# Patient Record
Sex: Female | Born: 1945
Health system: Southern US, Community
[De-identification: ages and names within clinical notes are randomized; demographics above are authoritative.]

## PROBLEM LIST (undated history)

## (undated) DIAGNOSIS — D509 Iron deficiency anemia, unspecified: Secondary | ICD-10-CM

## (undated) DIAGNOSIS — Z856 Personal history of leukemia: Secondary | ICD-10-CM

## (undated) DIAGNOSIS — Z4689 Encounter for fitting and adjustment of other specified devices: Secondary | ICD-10-CM

## (undated) DIAGNOSIS — C801 Malignant (primary) neoplasm, unspecified: Secondary | ICD-10-CM

## (undated) DIAGNOSIS — D759 Disease of blood and blood-forming organs, unspecified: Secondary | ICD-10-CM

## (undated) DIAGNOSIS — K219 Gastro-esophageal reflux disease without esophagitis: Secondary | ICD-10-CM

## (undated) DIAGNOSIS — E669 Obesity, unspecified: Secondary | ICD-10-CM

## (undated) DIAGNOSIS — E785 Hyperlipidemia, unspecified: Secondary | ICD-10-CM

## (undated) DIAGNOSIS — I10 Essential (primary) hypertension: Secondary | ICD-10-CM

## (undated) DIAGNOSIS — I639 Cerebral infarction, unspecified: Secondary | ICD-10-CM

## (undated) DIAGNOSIS — M1990 Unspecified osteoarthritis, unspecified site: Secondary | ICD-10-CM

## (undated) HISTORY — DX: Obesity, unspecified: E66.9

## (undated) HISTORY — DX: Iron deficiency anemia, unspecified: D50.9

## (undated) HISTORY — DX: Cerebral infarction, unspecified: I63.9

## (undated) HISTORY — PX: ABDOMINAL HYSTERECTOMY: SHX81

## (undated) HISTORY — DX: Gastro-esophageal reflux disease without esophagitis: K21.9

## (undated) HISTORY — PX: HERNIA REPAIR: SHX51

## (undated) HISTORY — DX: Essential (primary) hypertension: I10

## (undated) HISTORY — PX: CHOLECYSTECTOMY: SHX55

## (undated) HISTORY — DX: Hyperlipidemia, unspecified: E78.5

## (undated) HISTORY — DX: Personal history of leukemia: Z85.6

## (undated) HISTORY — DX: Encounter for fitting and adjustment of other specified devices: Z46.89

## (undated) HISTORY — PX: OOPHORECTOMY: SHX86

---

## 2001-02-24 ENCOUNTER — Ambulatory Visit (HOSPITAL_COMMUNITY): Admission: RE | Admit: 2001-02-24 | Discharge: 2001-02-24 | Payer: Self-pay | Admitting: Family Medicine

## 2001-02-24 ENCOUNTER — Encounter: Payer: Self-pay | Admitting: Family Medicine

## 2002-02-24 ENCOUNTER — Ambulatory Visit (HOSPITAL_COMMUNITY): Admission: RE | Admit: 2002-02-24 | Discharge: 2002-02-24 | Payer: Self-pay | Admitting: Family Medicine

## 2002-02-24 ENCOUNTER — Encounter: Payer: Self-pay | Admitting: Family Medicine

## 2002-06-09 ENCOUNTER — Ambulatory Visit (HOSPITAL_COMMUNITY): Admission: RE | Admit: 2002-06-09 | Discharge: 2002-06-09 | Payer: Self-pay | Admitting: Internal Medicine

## 2002-06-09 HISTORY — PX: COLONOSCOPY: SHX174

## 2002-06-09 HISTORY — PX: ESOPHAGOGASTRODUODENOSCOPY: SHX1529

## 2003-03-07 ENCOUNTER — Ambulatory Visit (HOSPITAL_COMMUNITY): Admission: RE | Admit: 2003-03-07 | Discharge: 2003-03-07 | Payer: Self-pay | Admitting: Family Medicine

## 2003-04-29 ENCOUNTER — Encounter: Admission: RE | Admit: 2003-04-29 | Discharge: 2003-04-29 | Payer: Self-pay | Admitting: Oncology

## 2003-06-07 ENCOUNTER — Encounter: Admission: RE | Admit: 2003-06-07 | Discharge: 2003-06-07 | Payer: Self-pay | Admitting: Oncology

## 2003-06-07 ENCOUNTER — Encounter (HOSPITAL_COMMUNITY): Admission: RE | Admit: 2003-06-07 | Discharge: 2003-07-07 | Payer: Self-pay | Admitting: Oncology

## 2003-07-13 ENCOUNTER — Encounter: Admission: RE | Admit: 2003-07-13 | Discharge: 2003-07-13 | Payer: Self-pay | Admitting: Oncology

## 2003-07-13 ENCOUNTER — Encounter (HOSPITAL_COMMUNITY): Admission: RE | Admit: 2003-07-13 | Discharge: 2003-08-12 | Payer: Self-pay | Admitting: Oncology

## 2003-08-24 ENCOUNTER — Encounter (HOSPITAL_COMMUNITY): Admission: RE | Admit: 2003-08-24 | Discharge: 2003-09-23 | Payer: Self-pay | Admitting: Oncology

## 2003-08-24 ENCOUNTER — Encounter: Admission: RE | Admit: 2003-08-24 | Discharge: 2003-08-24 | Payer: Self-pay | Admitting: Oncology

## 2003-10-05 ENCOUNTER — Encounter (HOSPITAL_COMMUNITY): Admission: RE | Admit: 2003-10-05 | Discharge: 2003-11-04 | Payer: Self-pay | Admitting: Oncology

## 2003-10-05 ENCOUNTER — Encounter: Admission: RE | Admit: 2003-10-05 | Discharge: 2003-10-05 | Payer: Self-pay | Admitting: Oncology

## 2003-12-21 ENCOUNTER — Encounter (HOSPITAL_COMMUNITY): Admission: RE | Admit: 2003-12-21 | Discharge: 2004-01-20 | Payer: Self-pay | Admitting: Oncology

## 2003-12-21 ENCOUNTER — Encounter: Admission: RE | Admit: 2003-12-21 | Discharge: 2003-12-21 | Payer: Self-pay | Admitting: Oncology

## 2004-02-06 ENCOUNTER — Encounter (HOSPITAL_COMMUNITY): Admission: RE | Admit: 2004-02-06 | Discharge: 2004-03-07 | Payer: Self-pay | Admitting: Oncology

## 2004-02-06 ENCOUNTER — Ambulatory Visit (HOSPITAL_COMMUNITY): Payer: Self-pay | Admitting: Oncology

## 2004-02-06 ENCOUNTER — Encounter: Admission: RE | Admit: 2004-02-06 | Discharge: 2004-02-06 | Payer: Self-pay | Admitting: Oncology

## 2004-03-08 ENCOUNTER — Ambulatory Visit (HOSPITAL_COMMUNITY): Admission: RE | Admit: 2004-03-08 | Discharge: 2004-03-08 | Payer: Self-pay | Admitting: Family Medicine

## 2004-03-14 ENCOUNTER — Encounter: Admission: RE | Admit: 2004-03-14 | Discharge: 2004-03-14 | Payer: Self-pay | Admitting: Oncology

## 2004-03-14 ENCOUNTER — Encounter (HOSPITAL_COMMUNITY): Admission: RE | Admit: 2004-03-14 | Discharge: 2004-04-13 | Payer: Self-pay | Admitting: Oncology

## 2004-03-28 ENCOUNTER — Ambulatory Visit (HOSPITAL_COMMUNITY): Payer: Self-pay | Admitting: Oncology

## 2004-04-27 ENCOUNTER — Encounter (HOSPITAL_COMMUNITY): Admission: RE | Admit: 2004-04-27 | Discharge: 2004-05-27 | Payer: Self-pay | Admitting: Oncology

## 2004-04-27 ENCOUNTER — Encounter: Admission: RE | Admit: 2004-04-27 | Discharge: 2004-04-27 | Payer: Self-pay | Admitting: Oncology

## 2004-05-25 ENCOUNTER — Ambulatory Visit (HOSPITAL_COMMUNITY): Payer: Self-pay | Admitting: Oncology

## 2004-06-21 ENCOUNTER — Encounter (HOSPITAL_COMMUNITY): Admission: RE | Admit: 2004-06-21 | Discharge: 2004-07-21 | Payer: Self-pay | Admitting: Oncology

## 2004-06-21 ENCOUNTER — Encounter: Admission: RE | Admit: 2004-06-21 | Discharge: 2004-06-21 | Payer: Self-pay | Admitting: Oncology

## 2004-07-20 ENCOUNTER — Ambulatory Visit (HOSPITAL_COMMUNITY): Payer: Self-pay | Admitting: Oncology

## 2004-08-06 ENCOUNTER — Encounter: Admission: RE | Admit: 2004-08-06 | Discharge: 2004-08-06 | Payer: Self-pay | Admitting: Oncology

## 2004-08-06 ENCOUNTER — Encounter (HOSPITAL_COMMUNITY): Admission: RE | Admit: 2004-08-06 | Discharge: 2004-09-05 | Payer: Self-pay | Admitting: Oncology

## 2004-09-28 ENCOUNTER — Encounter: Admission: RE | Admit: 2004-09-28 | Discharge: 2004-09-28 | Payer: Self-pay | Admitting: Oncology

## 2004-09-28 ENCOUNTER — Ambulatory Visit (HOSPITAL_COMMUNITY): Payer: Self-pay | Admitting: Oncology

## 2004-11-09 ENCOUNTER — Encounter (HOSPITAL_COMMUNITY): Admission: RE | Admit: 2004-11-09 | Discharge: 2004-12-08 | Payer: Self-pay | Admitting: Oncology

## 2004-11-09 ENCOUNTER — Encounter: Admission: RE | Admit: 2004-11-09 | Discharge: 2004-12-08 | Payer: Self-pay | Admitting: Oncology

## 2004-12-21 ENCOUNTER — Encounter (HOSPITAL_COMMUNITY): Admission: RE | Admit: 2004-12-21 | Discharge: 2005-01-20 | Payer: Self-pay | Admitting: Oncology

## 2004-12-21 ENCOUNTER — Encounter: Admission: RE | Admit: 2004-12-21 | Discharge: 2004-12-21 | Payer: Self-pay | Admitting: Oncology

## 2004-12-21 ENCOUNTER — Ambulatory Visit (HOSPITAL_COMMUNITY): Payer: Self-pay | Admitting: Oncology

## 2005-01-30 ENCOUNTER — Encounter (HOSPITAL_COMMUNITY): Admission: RE | Admit: 2005-01-30 | Discharge: 2005-03-01 | Payer: Self-pay | Admitting: Oncology

## 2005-01-30 ENCOUNTER — Encounter: Admission: RE | Admit: 2005-01-30 | Discharge: 2005-01-30 | Payer: Self-pay | Admitting: Oncology

## 2005-03-08 ENCOUNTER — Encounter (HOSPITAL_COMMUNITY): Admission: RE | Admit: 2005-03-08 | Discharge: 2005-04-07 | Payer: Self-pay | Admitting: Oncology

## 2005-03-08 ENCOUNTER — Encounter: Admission: RE | Admit: 2005-03-08 | Discharge: 2005-03-08 | Payer: Self-pay | Admitting: Oncology

## 2005-03-08 ENCOUNTER — Ambulatory Visit (HOSPITAL_COMMUNITY): Payer: Self-pay | Admitting: Oncology

## 2005-04-04 ENCOUNTER — Encounter (HOSPITAL_COMMUNITY): Admission: RE | Admit: 2005-04-04 | Discharge: 2005-05-04 | Payer: Self-pay | Admitting: Oncology

## 2005-04-04 ENCOUNTER — Ambulatory Visit (HOSPITAL_COMMUNITY): Admission: RE | Admit: 2005-04-04 | Discharge: 2005-04-04 | Payer: Self-pay | Admitting: Family Medicine

## 2005-04-04 ENCOUNTER — Encounter: Admission: RE | Admit: 2005-04-04 | Discharge: 2005-04-04 | Payer: Self-pay | Admitting: Oncology

## 2005-05-03 ENCOUNTER — Ambulatory Visit (HOSPITAL_COMMUNITY): Payer: Self-pay | Admitting: Oncology

## 2005-06-17 ENCOUNTER — Encounter: Admission: RE | Admit: 2005-06-17 | Discharge: 2005-06-17 | Payer: Self-pay | Admitting: Oncology

## 2005-06-17 ENCOUNTER — Encounter (HOSPITAL_COMMUNITY): Admission: RE | Admit: 2005-06-17 | Discharge: 2005-07-17 | Payer: Self-pay | Admitting: Oncology

## 2005-07-19 ENCOUNTER — Ambulatory Visit (HOSPITAL_COMMUNITY): Admission: RE | Admit: 2005-07-19 | Discharge: 2005-07-19 | Payer: Self-pay | Admitting: Family Medicine

## 2005-07-29 ENCOUNTER — Encounter: Admission: RE | Admit: 2005-07-29 | Discharge: 2005-07-29 | Payer: Self-pay | Admitting: Oncology

## 2005-07-29 ENCOUNTER — Ambulatory Visit (HOSPITAL_COMMUNITY): Payer: Self-pay | Admitting: Oncology

## 2005-07-29 ENCOUNTER — Encounter (HOSPITAL_COMMUNITY): Admission: RE | Admit: 2005-07-29 | Discharge: 2005-08-28 | Payer: Self-pay | Admitting: Oncology

## 2005-09-09 ENCOUNTER — Encounter: Admission: RE | Admit: 2005-09-09 | Discharge: 2005-09-09 | Payer: Self-pay | Admitting: Oncology

## 2005-09-09 ENCOUNTER — Encounter (HOSPITAL_COMMUNITY): Admission: RE | Admit: 2005-09-09 | Discharge: 2005-10-09 | Payer: Self-pay | Admitting: Oncology

## 2005-10-25 ENCOUNTER — Encounter: Admission: RE | Admit: 2005-10-25 | Discharge: 2005-10-25 | Payer: Self-pay | Admitting: Oncology

## 2005-10-25 ENCOUNTER — Encounter (HOSPITAL_COMMUNITY): Admission: RE | Admit: 2005-10-25 | Discharge: 2005-11-24 | Payer: Self-pay | Admitting: Oncology

## 2005-12-06 ENCOUNTER — Encounter (HOSPITAL_COMMUNITY): Admission: RE | Admit: 2005-12-06 | Discharge: 2006-01-05 | Payer: Self-pay | Admitting: Oncology

## 2005-12-06 ENCOUNTER — Encounter: Admission: RE | Admit: 2005-12-06 | Discharge: 2005-12-06 | Payer: Self-pay | Admitting: Oncology

## 2006-01-17 ENCOUNTER — Encounter (HOSPITAL_COMMUNITY): Admission: RE | Admit: 2006-01-17 | Discharge: 2006-02-16 | Payer: Self-pay | Admitting: Oncology

## 2006-01-17 ENCOUNTER — Encounter: Admission: RE | Admit: 2006-01-17 | Discharge: 2006-01-17 | Payer: Self-pay | Admitting: Oncology

## 2006-01-17 ENCOUNTER — Ambulatory Visit (HOSPITAL_COMMUNITY): Payer: Self-pay | Admitting: Oncology

## 2006-02-28 ENCOUNTER — Encounter (HOSPITAL_COMMUNITY): Admission: RE | Admit: 2006-02-28 | Discharge: 2006-03-10 | Payer: Self-pay | Admitting: Oncology

## 2006-04-07 ENCOUNTER — Ambulatory Visit (HOSPITAL_COMMUNITY): Admission: RE | Admit: 2006-04-07 | Discharge: 2006-04-07 | Payer: Self-pay | Admitting: Family Medicine

## 2006-04-14 ENCOUNTER — Ambulatory Visit (HOSPITAL_COMMUNITY): Payer: Self-pay | Admitting: Oncology

## 2006-04-14 ENCOUNTER — Encounter (HOSPITAL_COMMUNITY): Admission: RE | Admit: 2006-04-14 | Discharge: 2006-05-14 | Payer: Self-pay | Admitting: Oncology

## 2006-05-29 ENCOUNTER — Encounter (HOSPITAL_COMMUNITY): Admission: RE | Admit: 2006-05-29 | Discharge: 2006-06-28 | Payer: Self-pay | Admitting: Oncology

## 2006-07-11 ENCOUNTER — Encounter (HOSPITAL_COMMUNITY): Admission: RE | Admit: 2006-07-11 | Discharge: 2006-08-10 | Payer: Self-pay | Admitting: Oncology

## 2006-07-11 ENCOUNTER — Ambulatory Visit (HOSPITAL_COMMUNITY): Payer: Self-pay | Admitting: Oncology

## 2006-08-25 ENCOUNTER — Encounter (HOSPITAL_COMMUNITY): Admission: RE | Admit: 2006-08-25 | Discharge: 2006-09-24 | Payer: Self-pay | Admitting: Oncology

## 2006-10-02 ENCOUNTER — Encounter (HOSPITAL_COMMUNITY): Admission: RE | Admit: 2006-10-02 | Discharge: 2006-11-01 | Payer: Self-pay | Admitting: Oncology

## 2006-10-02 ENCOUNTER — Ambulatory Visit (HOSPITAL_COMMUNITY): Payer: Self-pay | Admitting: Oncology

## 2006-11-04 ENCOUNTER — Encounter (HOSPITAL_COMMUNITY): Admission: RE | Admit: 2006-11-04 | Discharge: 2006-12-04 | Payer: Self-pay | Admitting: Oncology

## 2007-01-15 ENCOUNTER — Other Ambulatory Visit: Admission: RE | Admit: 2007-01-15 | Discharge: 2007-01-15 | Payer: Self-pay | Admitting: Obstetrics and Gynecology

## 2007-01-20 ENCOUNTER — Ambulatory Visit (HOSPITAL_COMMUNITY): Admission: RE | Admit: 2007-01-20 | Discharge: 2007-01-20 | Payer: Self-pay | Admitting: Obstetrics & Gynecology

## 2007-01-30 ENCOUNTER — Ambulatory Visit (HOSPITAL_COMMUNITY): Payer: Self-pay | Admitting: Oncology

## 2007-01-30 ENCOUNTER — Encounter (HOSPITAL_COMMUNITY): Admission: RE | Admit: 2007-01-30 | Discharge: 2007-03-01 | Payer: Self-pay | Admitting: Oncology

## 2007-03-12 HISTORY — PX: OTHER SURGICAL HISTORY: SHX169

## 2007-04-20 ENCOUNTER — Ambulatory Visit (HOSPITAL_COMMUNITY): Admission: RE | Admit: 2007-04-20 | Discharge: 2007-04-20 | Payer: Self-pay | Admitting: Family Medicine

## 2007-05-06 ENCOUNTER — Encounter (HOSPITAL_COMMUNITY): Admission: RE | Admit: 2007-05-06 | Discharge: 2007-06-05 | Payer: Self-pay | Admitting: Oncology

## 2007-05-06 ENCOUNTER — Ambulatory Visit (HOSPITAL_COMMUNITY): Payer: Self-pay | Admitting: Oncology

## 2007-05-22 ENCOUNTER — Ambulatory Visit (HOSPITAL_COMMUNITY): Admission: RE | Admit: 2007-05-22 | Discharge: 2007-05-22 | Payer: Self-pay | Admitting: Family Medicine

## 2007-06-17 ENCOUNTER — Encounter (HOSPITAL_COMMUNITY): Admission: RE | Admit: 2007-06-17 | Discharge: 2007-07-17 | Payer: Self-pay | Admitting: Oncology

## 2007-09-04 ENCOUNTER — Ambulatory Visit (HOSPITAL_COMMUNITY): Payer: Self-pay | Admitting: Oncology

## 2007-09-10 ENCOUNTER — Encounter: Payer: Self-pay | Admitting: Obstetrics and Gynecology

## 2007-09-10 ENCOUNTER — Ambulatory Visit (HOSPITAL_COMMUNITY): Admission: RE | Admit: 2007-09-10 | Discharge: 2007-09-11 | Payer: Self-pay | Admitting: Obstetrics and Gynecology

## 2007-10-14 ENCOUNTER — Encounter (HOSPITAL_COMMUNITY): Admission: RE | Admit: 2007-10-14 | Discharge: 2007-11-13 | Payer: Self-pay | Admitting: Oncology

## 2007-11-27 ENCOUNTER — Encounter (HOSPITAL_COMMUNITY): Admission: RE | Admit: 2007-11-27 | Discharge: 2007-12-27 | Payer: Self-pay | Admitting: Oncology

## 2007-11-27 ENCOUNTER — Ambulatory Visit (HOSPITAL_COMMUNITY): Payer: Self-pay | Admitting: Oncology

## 2008-01-08 ENCOUNTER — Encounter (HOSPITAL_COMMUNITY): Admission: RE | Admit: 2008-01-08 | Discharge: 2008-02-07 | Payer: Self-pay | Admitting: Oncology

## 2008-02-26 ENCOUNTER — Ambulatory Visit (HOSPITAL_COMMUNITY): Payer: Self-pay | Admitting: Oncology

## 2008-02-26 ENCOUNTER — Encounter (HOSPITAL_COMMUNITY): Admission: RE | Admit: 2008-02-26 | Discharge: 2008-03-09 | Payer: Self-pay | Admitting: Oncology

## 2008-04-08 ENCOUNTER — Ambulatory Visit (HOSPITAL_COMMUNITY): Admission: RE | Admit: 2008-04-08 | Discharge: 2008-04-08 | Payer: Self-pay | Admitting: Family Medicine

## 2008-04-12 ENCOUNTER — Encounter (HOSPITAL_COMMUNITY): Admission: RE | Admit: 2008-04-12 | Discharge: 2008-05-12 | Payer: Self-pay | Admitting: Oncology

## 2008-04-12 ENCOUNTER — Ambulatory Visit (HOSPITAL_COMMUNITY): Payer: Self-pay | Admitting: Oncology

## 2008-04-22 ENCOUNTER — Ambulatory Visit (HOSPITAL_COMMUNITY): Admission: RE | Admit: 2008-04-22 | Discharge: 2008-04-22 | Payer: Self-pay | Admitting: Family Medicine

## 2008-06-09 ENCOUNTER — Encounter (HOSPITAL_COMMUNITY): Admission: RE | Admit: 2008-06-09 | Discharge: 2008-07-09 | Payer: Self-pay | Admitting: Oncology

## 2008-06-09 ENCOUNTER — Ambulatory Visit (HOSPITAL_COMMUNITY): Payer: Self-pay | Admitting: Oncology

## 2008-08-11 ENCOUNTER — Encounter (HOSPITAL_COMMUNITY): Admission: RE | Admit: 2008-08-11 | Discharge: 2008-09-10 | Payer: Self-pay | Admitting: Oncology

## 2008-08-11 ENCOUNTER — Ambulatory Visit (HOSPITAL_COMMUNITY): Payer: Self-pay | Admitting: Oncology

## 2008-10-05 ENCOUNTER — Encounter: Admission: RE | Admit: 2008-10-05 | Discharge: 2008-10-05 | Payer: Self-pay | Admitting: Endocrinology

## 2008-10-05 ENCOUNTER — Other Ambulatory Visit: Admission: RE | Admit: 2008-10-05 | Discharge: 2008-10-05 | Payer: Self-pay | Admitting: Interventional Radiology

## 2008-10-05 ENCOUNTER — Encounter (INDEPENDENT_AMBULATORY_CARE_PROVIDER_SITE_OTHER): Payer: Self-pay | Admitting: Interventional Radiology

## 2008-11-09 ENCOUNTER — Ambulatory Visit (HOSPITAL_COMMUNITY): Payer: Self-pay | Admitting: Oncology

## 2008-11-09 ENCOUNTER — Encounter (HOSPITAL_COMMUNITY): Admission: RE | Admit: 2008-11-09 | Discharge: 2008-12-07 | Payer: Self-pay | Admitting: Oncology

## 2009-01-04 ENCOUNTER — Encounter (HOSPITAL_COMMUNITY): Admission: RE | Admit: 2009-01-04 | Discharge: 2009-02-03 | Payer: Self-pay | Admitting: Oncology

## 2009-01-05 ENCOUNTER — Ambulatory Visit (HOSPITAL_COMMUNITY): Payer: Self-pay | Admitting: Oncology

## 2009-03-07 ENCOUNTER — Encounter (HOSPITAL_COMMUNITY): Admission: RE | Admit: 2009-03-07 | Discharge: 2009-03-10 | Payer: Self-pay | Admitting: Oncology

## 2009-03-07 ENCOUNTER — Ambulatory Visit (HOSPITAL_COMMUNITY): Payer: Self-pay | Admitting: Oncology

## 2009-05-01 ENCOUNTER — Ambulatory Visit (HOSPITAL_COMMUNITY): Admission: RE | Admit: 2009-05-01 | Discharge: 2009-05-01 | Payer: Self-pay | Admitting: Family Medicine

## 2010-04-05 ENCOUNTER — Other Ambulatory Visit (HOSPITAL_COMMUNITY): Payer: Self-pay | Admitting: Internal Medicine

## 2010-04-05 DIAGNOSIS — Z139 Encounter for screening, unspecified: Secondary | ICD-10-CM

## 2010-05-03 ENCOUNTER — Ambulatory Visit (HOSPITAL_COMMUNITY): Payer: BC Managed Care – PPO

## 2010-05-07 ENCOUNTER — Ambulatory Visit (HOSPITAL_COMMUNITY)
Admission: RE | Admit: 2010-05-07 | Discharge: 2010-05-07 | Disposition: A | Discharge status: Home / Self Care | Payer: BC Managed Care – PPO | Source: Ambulatory Visit | Attending: Internal Medicine | Admitting: Internal Medicine

## 2010-05-07 DIAGNOSIS — Z1231 Encounter for screening mammogram for malignant neoplasm of breast: Secondary | ICD-10-CM | POA: Insufficient documentation

## 2010-05-07 DIAGNOSIS — Z139 Encounter for screening, unspecified: Secondary | ICD-10-CM

## 2010-06-11 LAB — COMPREHENSIVE METABOLIC PANEL
GFR calc Af Amer: >60 mL/min (ref >60)
GFR calc non Af Amer: >60 mL/min (ref >60)
Glucose, Bld: 222 mg/dL — ABNORMAL HIGH (ref 70–99)
Sodium: 138 mEq/L (ref 135–145)
Total Bilirubin: 0.6 mg/dL (ref 0.3–1.2)
Total Protein: 7.3 g/dL (ref 6.0–8.3)

## 2010-06-11 LAB — DIFFERENTIAL
Basophils Relative: 1 % (ref 0–1)
Lymphs Abs: 2.4 10*3/uL (ref 0.7–4.0)
Neutro Abs: 4.4 10*3/uL (ref 1.7–7.7)
Neutrophils Relative %: 60 % (ref 43–77)

## 2010-06-11 LAB — CBC
Hemoglobin: 13.5 g/dL (ref 12.0–15.0)
MCV: 84.8 fL (ref 78.0–100.0)
Platelets: 188 10*3/uL (ref 150–400)
WBC: 7.4 10*3/uL (ref 4.0–10.5)

## 2010-06-14 LAB — CBC
MCHC: 33.8 g/dL (ref 30.0–36.0)
Platelets: 213 10*3/uL (ref 150–400)
RBC: 4.71 MIL/uL (ref 3.87–5.11)
WBC: 9.9 10*3/uL (ref 4.0–10.5)

## 2010-06-14 LAB — DIFFERENTIAL
Basophils Relative: 1 % (ref 0–1)
Eosinophils Absolute: 0.1 10*3/uL (ref 0.0–0.7)
Eosinophils Relative: 1 % (ref 0–5)
Lymphocytes Relative: 31 % (ref 12–46)
Lymphs Abs: 3.1 10*3/uL (ref 0.7–4.0)
Monocytes Absolute: 0.4 10*3/uL (ref 0.1–1.0)
Monocytes Relative: 4 % (ref 3–12)
Neutro Abs: 6.3 10*3/uL (ref 1.7–7.7)
Neutrophils Relative %: 63 % (ref 43–77)

## 2010-06-15 LAB — COMPREHENSIVE METABOLIC PANEL
AST: 27 U/L (ref 0–37)
BUN: 11 mg/dL (ref 6–23)
CO2: 28 mEq/L (ref 19–32)
Chloride: 106 mEq/L (ref 96–112)
Potassium: 3.3 mEq/L — ABNORMAL LOW (ref 3.5–5.1)
Sodium: 142 mEq/L (ref 135–145)
Total Bilirubin: 0.5 mg/dL (ref 0.3–1.2)
Total Protein: 7.2 g/dL (ref 6.0–8.3)

## 2010-06-15 LAB — CBC
MCV: 84.7 fL (ref 78.0–100.0)
RDW: 15.8 % — ABNORMAL HIGH (ref 11.5–15.5)

## 2010-06-15 LAB — DIFFERENTIAL
Basophils Absolute: 0 10*3/uL (ref 0.0–0.1)
Basophils Relative: 1 % (ref 0–1)
Eosinophils Absolute: 0.1 10*3/uL (ref 0.0–0.7)
Lymphs Abs: 2.9 10*3/uL (ref 0.7–4.0)

## 2010-06-16 ENCOUNTER — Emergency Department (HOSPITAL_COMMUNITY)
Admission: EM | Admit: 2010-06-16 | Discharge: 2010-06-16 | Disposition: A | Discharge status: Home / Self Care | Payer: Medicare HMO | Attending: Emergency Medicine | Admitting: Emergency Medicine

## 2010-06-16 ENCOUNTER — Emergency Department (HOSPITAL_COMMUNITY): Payer: Medicare HMO

## 2010-06-16 DIAGNOSIS — T07XXXA Unspecified multiple injuries, initial encounter: Secondary | ICD-10-CM | POA: Insufficient documentation

## 2010-06-16 DIAGNOSIS — Y998 Other external cause status: Secondary | ICD-10-CM | POA: Insufficient documentation

## 2010-06-16 DIAGNOSIS — W010XXA Fall on same level from slipping, tripping and stumbling without subsequent striking against object, initial encounter: Secondary | ICD-10-CM | POA: Insufficient documentation

## 2010-06-17 LAB — CBC
Hemoglobin: 13.9 g/dL (ref 12.0–15.0)
MCV: 84 fL (ref 78.0–100.0)
Platelets: 195 10*3/uL (ref 150–400)
RDW: 14.8 % (ref 11.5–15.5)
WBC: 7.3 10*3/uL (ref 4.0–10.5)

## 2010-06-17 LAB — DIFFERENTIAL
Lymphs Abs: 2.5 10*3/uL (ref 0.7–4.0)
Monocytes Absolute: 0.4 10*3/uL (ref 0.1–1.0)
Neutrophils Relative %: 59 % (ref 43–77)

## 2010-06-18 LAB — DIFFERENTIAL
Basophils Relative: 1 % (ref 0–1)
Eosinophils Absolute: 0.1 10*3/uL (ref 0.0–0.7)
Lymphs Abs: 2.7 10*3/uL (ref 0.7–4.0)
Monocytes Relative: 8 % (ref 3–12)
Neutro Abs: 4.1 10*3/uL (ref 1.7–7.7)
Neutrophils Relative %: 55 % (ref 43–77)

## 2010-06-18 LAB — CBC
MCV: 84.3 fL (ref 78.0–100.0)
Platelets: 154 10*3/uL (ref 150–400)
RBC: 4.48 MIL/uL (ref 3.87–5.11)
WBC: 7.5 10*3/uL (ref 4.0–10.5)

## 2010-06-20 LAB — COMPREHENSIVE METABOLIC PANEL
AST: 30 U/L (ref 0–37)
Albumin: 4 g/dL (ref 3.5–5.2)
Alkaline Phosphatase: 97 U/L (ref 39–117)
CO2: 26 mEq/L (ref 19–32)
Chloride: 104 mEq/L (ref 96–112)
GFR calc Af Amer: >60 mL/min (ref >60)
GFR calc non Af Amer: >60 mL/min (ref >60)
Potassium: 3.7 mEq/L (ref 3.5–5.1)
Total Bilirubin: 0.8 mg/dL (ref 0.3–1.2)

## 2010-06-20 LAB — CBC
HCT: 40.2 % (ref 36.0–46.0)
MCV: 84 fL (ref 78.0–100.0)
Platelets: 165 10*3/uL (ref 150–400)
RBC: 4.79 MIL/uL (ref 3.87–5.11)
WBC: 7.8 10*3/uL (ref 4.0–10.5)

## 2010-06-20 LAB — DIFFERENTIAL
Basophils Absolute: 0.1 10*3/uL (ref 0.0–0.1)
Basophils Relative: 1 % (ref 0–1)
Eosinophils Absolute: 0.1 10*3/uL (ref 0.0–0.7)
Eosinophils Relative: 1 % (ref 0–5)
Monocytes Absolute: 0.4 10*3/uL (ref 0.1–1.0)

## 2010-06-20 LAB — BCR/ABL GENE REARRANGEMENT QNT, PCR

## 2010-06-26 LAB — CBC
Platelets: 189 10*3/uL (ref 150–400)
RDW: 15.1 % (ref 11.5–15.5)
WBC: 7.1 10*3/uL (ref 4.0–10.5)

## 2010-06-26 LAB — DIFFERENTIAL
Basophils Absolute: 0 10*3/uL (ref 0.0–0.1)
Eosinophils Relative: 1 % (ref 0–5)
Lymphocytes Relative: 39 % (ref 12–46)
Neutro Abs: 3.6 10*3/uL (ref 1.7–7.7)
Neutrophils Relative %: 51 % (ref 43–77)

## 2010-07-24 NOTE — H&P (Signed)
[name], [name]               ACCOUNT NO.:  [id number]   MEDICAL RECORD NO.:  [id number]          PATIENT TYPE:  AMB   LOCATION:  DAY                           FACILITY:  [facility]   PHYSICIAN:  Jonnie Kind, M.D. DATE OF BIRTH:  1945-05-30   DATE OF ADMISSION:  09/10/2007  DATE OF DISCHARGE:  LH                              HISTORY & PHYSICAL   ADMITTING DIAGNOSIS:  1. Cystocele.  2. Mild rectocele.  3. Left adnexal cyst.   HISTORY OF PRESENT ILLNESS:  This 65 year old female many years status  post vaginal hysterectomy for bleeding and benign indications is  admitted at this time for a anterior and posterior repair due to  symptomatic cystocele without associated stress incontinence.  Mild  rectocele exists and posterior repair will be performed to reinforce the  anterior repair's results.  Additionally, the patient has a longstanding  left adnexal cyst followed since 2007 with simple-appearing left ovarian  cyst that is stable at about 5 x 4 x 7 cm.  CA-125 remains in the normal  range.  Bilateral salpingo-oophorectomy is planned to eliminate the need  for continued monitoring.  The patient is aware that the procedure may  require laparotomy if necessary to access the tube and ovary.  The risks  of procedure including close proximity of surgery to bladder, bowel, and  ureters has been discussed.   REVIEW OF SYSTEMS:  Negative for stress incontinence and negative for  urinary tract infections.  She has nocturia 1-2 times per night.  She  has a sense of  fullness and pressure at the vaginal entrance on a  regular basis.   PAST MEDICAL HISTORY:  Positive for chronic myelogenous leukemia, stable  times years.  Left ovarian cyst followed for medical care by [facility], Dr. Caron Presume.  Medical conditions,  hypertension.  Operations, vaginal hysterectomy, laparoscopic cholecystectomy, and also  GERD.   MEDICATIONS:  1. Gleevec 400 mg p.o. daily.  2. Nexium 40  mg p.o. daily.  3. Lotrel 10/20 1 tablet daily.   PHYSICAL EXAMINATION:  GENERAL:  Shows a large frame, moderately obese,  African-American female.  VITAL SIGNS:  Weight 215, blood pressure 140/80, and pulse 70s.  HEENT:  Pupils equal, round, and reactive.  NECK:  Supple.  CARDIOVASCULAR:  Unremarkable.  BREASTS:  Deferred.  ABDOMEN:  Well-healed laparoscopic incision site.  External Genitalia: Obvious cystocele, mild posterior relaxation  multiparous.  Relaxed introitus .   DIAGNOSTIC IMAGING:  The ultrasound shows a 5 x 4 x 7 cm simple cyst in  the left adnexa.   PLAN:  Laparoscopic bilateral salpingo-oophorectomy and subsequent  anterior and posterior repair on September 10, 2007.      Jonnie Kind, M.D.  Electronically Signed     JVF/MEDQ  D:  09/10/2007  T:  09/10/2007  Job:  [id number]   cc:   [place]

## 2010-07-24 NOTE — Op Note (Signed)
NAMEVIVECA, Page               ACCOUNT NO.:  [id number]   MEDICAL RECORD NO.:  [id number]          PATIENT TYPE:  OIB   LOCATION:  A321                          FACILITY:  [facility]   PHYSICIAN:  Jonnie Kind, M.D. DATE OF BIRTH:  1945/04/23   DATE OF PROCEDURE:  09/10/2007  DATE OF DISCHARGE:                               OPERATIVE REPORT   PREOPERATIVE DIAGNOSES:  1. Cystocele.  2. Rectocele.  3. Postmenopausal left adnexal cyst.   POSTOPERATIVE DIAGNOSES:  1. Cystocele.  2. Rectocele.  3. Postmenopausal left adnexal cyst.  4. Left adnexal cyst is due to a left paratubal cyst.   PROCEDURES:  1. Laparoscopic bilateral salpingo-oophorectomy.  2. Anterior repair.  3. Posterior repair.   Note, anterior and posterior repair are through separate  incision(performed vaginally),from the Lap BSO,   SURGEON:  Jonnie Kind, M.D.   ASSISTANT:  None.   ANESTHESIA:  General.   COMPLICATIONS:  None.   FINDINGS:  1. Large paratubal cyst adjacent to a grossly normal atrophic      postmenopausal left ovary.  2. Right tube and ovary more densely adherent to the vaginal cuff.  3. Right round ligament left in place and attached to the vaginal      cuff.  Omental adhesions to the anterior abdominal wall near the      umbilicus were released.   DETAILS OF PROCEDURE:  The patient was taken to the operating room,  prepped and draped for a combined abdominal and vaginal procedure.  Foley catheter was inserted, and the single-tooth tenaculum used to  grasp the vaginal cuff at the site of the prior hysterectomy.  The  patient had abdominal area prepped and draped as well, and an  infraumbilical vertical 2-cm skin incision was made as well as a  transverse 2-cm incision and a right lower quadrant 1-cm incision made  in the skin.  Veress needle was introduced through the umbilicus and  pneumoperitoneum achieved after water droplet test confirmed  intraperitoneal location, and the  pneumoperitoneum was achieved under 9  mmHg pressure.  Once 3 liters of CO2 were introduced, the laparoscopic  trocar was introduced under direct visualization, and attention directed  to the pelvis.  Suprapubic trocar was introduced under direct  visualization as well, and an 11-mm trocar as well as the right lower  quadrant 5-mm trocar placed easily.  The attention was directed to the  pelvis.  The patient was placed in a steep Trendelenburg position well  elevated above the pelvis.  The left tube and ovary identified and the  paratubal cyst confirmed as being adjacent tube and not immediately  contagious with the ovary.  The tube and ovary were grasped using  laparoscopic grasping devices through the suprapubic and right lower  quadrant trocar site.  The Harmonic scalpel was introduced through the  right lower quadrant port, and the infundibulopelvic ligament on the  left identified and transected in small bites using low power settings  on the harmonic scalpel.  We stayed closed to the ovary to stay out of  the area, where the ureter lived.  The procedure was performed easily  and the specimen taken out through an EndoCatch bag.  The confidence  level of the benign nature of the cyst allowed [place] to decompress the cyst  laparoscopically with an aspiration puncture needle prior to placing an  EndoCatch bag and taking it out.  Frozen section was still run on the  specimen to confirm its benign nature.  Right lower quadrant was  inspected and the right tube and ovary were found to be more extensively  adherent to the pelvic floor.  The right infundibulopelvic ligament was  clearly identified.  The right round ligament was attached all the way  down to the vaginal cuff.  The peritoneal window behind the round  ligament was opened to allow the infundibulopelvic ligament to be  isolated on this side as well.  The specimen was then grasped and placed  on countertraction and the right  infundibulopelvic ligament serially  clamped and coagulated and transected using the harmonic scalpel.  The  tube and ovary were taken off as a separate specimen and passed off.  The right round ligament was left in place.  The pelvic adhesions were  released as noted.  Additionally, there were some omental adhesions at  the paraumbilical space that were then transected using the camera  through the suprapubic site and the 5-mm harmonic scalpel as a cutting  device.  Hemostasis was excellent.  A 120 mL of saline was left in the  abdomen and the abdomen deflated.  The fascia was closed at the  umbilical and suprapubic site using Allis clamp, grasping the fascial  edges, and 0-Vicryl suture for closure.  Subcuticular 3-0 Dexon closure  of all 3 puncture sites then was performed and Dermabond placed on two  of the three sites, and band-aid placed on all three.   Anterior and posterior repair:  Anterior repair then followed.  The  Foley catheter bulb could be identified, and therefore the bladder  trigone confirmed.  Cystocele was above the site.  We grasped the  vaginal apex, injected beneath the bladder using Marcaine with  epinephrine, split the vaginal mucosa just in front of the vaginal apex  to about a 1.5 cm above the lateral trigone.  The vaginal mucosa was  mobilized off the bladder and the bladder elevated.  Lateral supportive  tissues could be identified and the vaginal epithelium was trimmed and  after pulling it together improved the anterior support.  Interrupted 0-  Vicryl sutures were used subcuticularly and then running 2-0 chromic  used to pull the skin edges together.  Specimen was sent off to  pathology.  The patient still had a posterior bulge, which was felt to  represent either enterocele or rectocele.  Posterior vaginal mucosa was  then opened beginning at the posterior perineal body at the level of the  hymenal remnants, and the vaginal mucosa dissected laterally.   The lower  half of the vagina was necessary to be opened and the upper portion  could be elevated off the underlying rectocele.  Double gloved right  index finger was placed per rectum to confirm the location of the  rectocele.  A pararectal defect was identified.  There was some firm  fibrotic tissue on the right side, which appeared to have resulted from  a pararectal tear just to the left of the rectum itself.  Allis clamps  could be used to grasp the margins of the tissue and also to grasp the  edges of the  firm tissue on the patient's left side.  A series of  interrupted 0-Vicryl sutures was used to pull the tissue back into the  perineal body and significantly improve the mid pelvis port.  The  perineal body was also built up with the second layer of interrupted 0-  Vicryl sutures x3.  A total of 12 sutures were used, both on the  anterior and posterior repair combined.   The patient then had vaginal mucosa posteriorly trimmed and 2-0 chromic  used to close the perineal body posterior epithelium.  The patient  tolerated the procedure well.  Gloves were changed, of course, prior to  tying down of the sutures and trimming of the vaginal mucosa.  Irrigation of the posterior perineal tissues prior to closure was also  performed.  Sponge and needle counts were correct.  The patient had  received preoperative antibiotic prophylaxis with Ancef.  Postprocedure  time-out were correct.  The patient then went to recovery room in stable  condition with vaginal packing in place and clear urine greater than 300  mL obtained intraoperatively.      Jonnie Kind, M.D.  Electronically Signed     JVF/MEDQ  D:  09/10/2007  T:  09/11/2007  Job:  [id number]   cc:   Bonne Dolores, M.D.  Fax: [place]

## 2010-07-27 NOTE — Op Note (Signed)
NAME:  April Page, April Page                         ACCOUNT NO.:  [id number]   MEDICAL RECORD NO.:  [id number]                   PATIENT TYPE:  AMB   LOCATION:  DAY                                  FACILITY:   PHYSICIAN:  R. Garfield Cornea, M.D.              DATE OF BIRTH:  Dec 22, 1945   DATE OF PROCEDURE:  06/09/2002  DATE OF DISCHARGE:                                 OPERATIVE REPORT   PROCEDURE:  Esophagogastroduodenoscopy with Venia Minks dilation followed by  screening colonoscopy.   INDICATIONS:  The patient is a 65 year old lady with reflux symptoms and  intermittent esophageal dysphagia, much improved recently on Nexium 40 mg  orally daily, alternating constipation and diarrhea.  No prior imaging of  her colon.  EGD and colonoscopy now being done.  This approach has been  discussed with the patient previously.  The potential risks, benefits and  alternatives have been reviewed. Questions answered.  Please see my office  dictation of May 11, 2002 for more information.   DESCRIPTION OF PROCEDURE:  Oxygen saturation, blood pressure, pulse and  respiration were monitored throughout the entire procedure.  Conscious  sedation with IV Versed and Demerol in incremental doses.  The instrument  was the Olympus video tip gastroscope and colonoscope.   EGD FINDINGS:  Esophagus:  Examination of the tubular esophagus revealed no  mucosal abnormalities.  The EG junction was easily traversed.  Stomach:  The gastric cavity was emptied and insufflated with air.  A  thoroughly examination of the gastric mucosa including retroflexion of the  proximal stomach and esophagogastric junction demonstrated no abnormalities.  Pylorus was patent and easily traversed.  Duodenum:  Duodenum, bulb and second portion appeared normal.   THERAPY AND DIAGNOSTIC MANEUVERS:  A 54-French Maloney dilator was passed to  full insertion with ease, was met with no apparent complications related to  passage of the dilator.   The patient tolerated the procedure well and was  prepared for colonoscopy.   COLONOSCOPY:  Digital rectal exam revealed no abnormalities.   ENDOSCOPIC FINDINGS:  Prep was good.  Rectum:  Examination of the rectal mucosa including retroflexed view of the  anal verge revealed only internal hemorrhoids.  Colon:  Colonic mucosa was surveyed from the rectosigmoid junction to the  left, transverse,  right colon to the area of the appendiceal orifice,  ileocecal valve and cecum. These structures were well seen and photographed  for the record.  Colonic mucosa to the cecum appeared normal.  The terminal  ileum was intubated at 10 cm and this segment of the GI tract also appeared  normal.  From this level the scope was slowly and cautiously withdrawn.  All  previously mentioned mucosal surfaces were again seen and again no other  abnormalities were observed.  The patient tolerated both procedures well and was reactive after endoscopy.   IMPRESSION:  1. Esophagogastroduodenoscopy:  Normal upper gastrointestinal tract, status  post passage of 54-French [facility] dilator.  2. Colonoscopy:  Internal hemorrhoids; otherwise normal rectum, colon and     terminal ileum.   RECOMMENDATIONS:  1. Continue Nexium 40 mg orally daily.  2. Add fiber to the diet with Benefiber or Metamucil daily.  3. Brief office visit with [place] in six weeks from now to make sure she is     doing well prior to concluding the consultation.                                                Bridgette Habermann, M.D.    RMR/MEDQ  D:  06/09/2002  T:  06/09/2002  Job:  [id number]   cc:   Bonne Dolores, M.D.  [address], [place] [address]  Fax: [phone]

## 2010-11-30 LAB — CBC
Hemoglobin: 12.6
RBC: 4.31
RDW: 14.4
WBC: 5.5

## 2010-11-30 LAB — DIFFERENTIAL
Basophils Absolute: 0.1
Lymphocytes Relative: 43
Lymphs Abs: 2.4
Monocytes Absolute: 0.3
Neutro Abs: 2.7

## 2010-11-30 LAB — BCR/ABL GENE REARRANGEMENT QNT, PCR

## 2010-12-04 LAB — CBC
HCT: 37.1
Hemoglobin: 12.7
RDW: 14.9
WBC: 7.1

## 2010-12-04 LAB — DIFFERENTIAL
Eosinophils Relative: 1
Lymphocytes Relative: 37
Lymphs Abs: 2.7
Monocytes Absolute: 0.4

## 2010-12-06 LAB — TYPE AND SCREEN: PT AG Type: NEGATIVE

## 2010-12-06 LAB — COMPREHENSIVE METABOLIC PANEL
Albumin: 4.1
BUN: 11
Creatinine, Ser: 0.69
Total Protein: 7.2

## 2010-12-06 LAB — CBC
HCT: 39
Hemoglobin: 12.3
MCHC: 33.2
MCV: 83.9
MCV: 86.1
Platelets: 192
RDW: 13.8
RDW: 14

## 2010-12-06 LAB — DIFFERENTIAL
Basophils Absolute: 0.1
Basophils Absolute: 0
Basophils Relative: 0
Eosinophils Absolute: 0
Eosinophils Relative: 0
Lymphocytes Relative: 36
Monocytes Absolute: 0.4
Monocytes Absolute: 0.6
Monocytes Relative: 7
Neutro Abs: 3.4
Neutro Abs: 8.5 — ABNORMAL HIGH

## 2010-12-07 LAB — CBC
Platelets: 183
RDW: 14.5

## 2010-12-07 LAB — DIFFERENTIAL
Basophils Absolute: 0
Basophils Relative: 0
Lymphocytes Relative: 34
Neutro Abs: 4.2
Neutrophils Relative %: 57

## 2010-12-10 LAB — COMPREHENSIVE METABOLIC PANEL
AST: 24
Albumin: 4
Alkaline Phosphatase: 78
BUN: 11
CO2: 29
Chloride: 107
Creatinine, Ser: 0.8
GFR calc Af Amer: >60
GFR calc non Af Amer: >60
Potassium: 3.4 — ABNORMAL LOW
Total Bilirubin: 0.5

## 2010-12-10 LAB — DIFFERENTIAL
Eosinophils Relative: 1
Lymphocytes Relative: 38
Lymphs Abs: 2.4
Monocytes Absolute: 0.4
Neutro Abs: 3.3

## 2010-12-10 LAB — CBC
HCT: 39.9
Hemoglobin: 13.5
WBC: 6.2

## 2010-12-11 LAB — DIFFERENTIAL
Basophils Relative: 1
Lymphs Abs: 2.9
Monocytes Absolute: 0.5
Monocytes Relative: 6
Neutro Abs: 4.6

## 2010-12-11 LAB — CBC
Hemoglobin: 13.2
MCHC: 32.7
MCV: 85.1
RBC: 4.73
WBC: 8.1

## 2010-12-14 LAB — CBC
HCT: 40.3 % (ref 36.0–46.0)
Hemoglobin: 13.5 g/dL (ref 12.0–15.0)
MCHC: 33.4 g/dL (ref 30.0–36.0)
MCV: 83.7 fL (ref 78.0–100.0)
RDW: 15.1 % (ref 11.5–15.5)

## 2010-12-14 LAB — COMPREHENSIVE METABOLIC PANEL
BUN: 9 mg/dL (ref 6–23)
CO2: 28 mEq/L (ref 19–32)
Calcium: 9.7 mg/dL (ref 8.4–10.5)
Creatinine, Ser: 0.72 mg/dL (ref 0.4–1.2)
GFR calc non Af Amer: >60 mL/min (ref >60)
Glucose, Bld: 132 mg/dL — ABNORMAL HIGH (ref 70–99)
Sodium: 140 mEq/L (ref 135–145)
Total Protein: 6.8 g/dL (ref 6.0–8.3)

## 2010-12-14 LAB — DIFFERENTIAL
Lymphocytes Relative: 35 % (ref 12–46)
Lymphs Abs: 2.4 10*3/uL (ref 0.7–4.0)
Monocytes Relative: 7 % (ref 3–12)
Neutro Abs: 3.8 10*3/uL (ref 1.7–7.7)
Neutrophils Relative %: 56 % (ref 43–77)

## 2010-12-18 LAB — CBC
Hemoglobin: 12.3
MCHC: 33.6
MCV: 88.7
RBC: 4.13
WBC: 6.3

## 2010-12-18 LAB — DIFFERENTIAL
Basophils Relative: 1
Eosinophils Absolute: 0.1 — ABNORMAL LOW
Lymphs Abs: 2.4
Monocytes Absolute: 0.4
Monocytes Relative: 7
Neutrophils Relative %: 53

## 2010-12-21 LAB — CBC
HCT: 34.8 — ABNORMAL LOW
Hemoglobin: 11.8 — ABNORMAL LOW
MCHC: 34
RBC: 3.94
RDW: 14.8 — ABNORMAL HIGH

## 2010-12-21 LAB — MISCELLANEOUS TEST

## 2010-12-21 LAB — BCR-ABL GENE MARKER (CONVERTED LAB)

## 2010-12-21 LAB — DIFFERENTIAL
Basophils Absolute: 0
Eosinophils Relative: 1
Lymphocytes Relative: 41
Monocytes Absolute: 0.5
Monocytes Relative: 7
Neutro Abs: 3.6

## 2010-12-24 LAB — CBC
HCT: 35.4 — ABNORMAL LOW
Hemoglobin: 12
MCHC: 33.8
Platelets: 225
RDW: 14.9 — ABNORMAL HIGH

## 2010-12-24 LAB — DIFFERENTIAL
Basophils Absolute: 0.1
Basophils Relative: 1
Eosinophils Absolute: 0.1
Eosinophils Relative: 1
Monocytes Absolute: 0.5

## 2010-12-26 LAB — DIFFERENTIAL
Basophils Absolute: 0
Eosinophils Relative: 2
Lymphocytes Relative: 42
Lymphs Abs: 2.9
Neutro Abs: 3.3
Neutrophils Relative %: 48

## 2010-12-26 LAB — CBC
HCT: 34.6 — ABNORMAL LOW
Platelets: 226
RDW: 14.5 — ABNORMAL HIGH
WBC: 6.9

## 2011-01-03 ENCOUNTER — Other Ambulatory Visit: Payer: Self-pay | Admitting: Sports Medicine

## 2011-01-03 DIAGNOSIS — M25511 Pain in right shoulder: Secondary | ICD-10-CM

## 2011-01-09 ENCOUNTER — Ambulatory Visit
Admission: RE | Admit: 2011-01-09 | Discharge: 2011-01-09 | Disposition: A | Discharge status: Home / Self Care | Payer: Medicare HMO | Source: Ambulatory Visit | Attending: Sports Medicine | Admitting: Sports Medicine

## 2011-01-09 ENCOUNTER — Other Ambulatory Visit: Payer: Medicare HMO

## 2011-01-09 DIAGNOSIS — M25511 Pain in right shoulder: Secondary | ICD-10-CM

## 2011-04-02 ENCOUNTER — Other Ambulatory Visit (HOSPITAL_COMMUNITY): Payer: Self-pay | Admitting: Internal Medicine

## 2011-04-02 DIAGNOSIS — Z139 Encounter for screening, unspecified: Secondary | ICD-10-CM

## 2011-04-16 ENCOUNTER — Other Ambulatory Visit (HOSPITAL_COMMUNITY): Payer: Self-pay | Admitting: Internal Medicine

## 2011-04-16 DIAGNOSIS — Z139 Encounter for screening, unspecified: Secondary | ICD-10-CM

## 2011-05-09 ENCOUNTER — Ambulatory Visit (HOSPITAL_COMMUNITY)
Admission: RE | Admit: 2011-05-09 | Discharge: 2011-05-09 | Disposition: A | Discharge status: Home / Self Care | Payer: Medicare Other | Source: Ambulatory Visit | Attending: Internal Medicine | Admitting: Internal Medicine

## 2011-05-09 DIAGNOSIS — Z139 Encounter for screening, unspecified: Secondary | ICD-10-CM

## 2011-05-09 DIAGNOSIS — Z1231 Encounter for screening mammogram for malignant neoplasm of breast: Secondary | ICD-10-CM | POA: Insufficient documentation

## 2011-05-09 DIAGNOSIS — M899 Disorder of bone, unspecified: Secondary | ICD-10-CM | POA: Insufficient documentation

## 2011-07-08 ENCOUNTER — Other Ambulatory Visit (HOSPITAL_COMMUNITY): Payer: Self-pay | Admitting: Internal Medicine

## 2011-07-09 ENCOUNTER — Other Ambulatory Visit (HOSPITAL_COMMUNITY): Payer: Self-pay | Admitting: Internal Medicine

## 2011-07-09 DIAGNOSIS — E049 Nontoxic goiter, unspecified: Secondary | ICD-10-CM

## 2011-07-09 DIAGNOSIS — R490 Dysphonia: Secondary | ICD-10-CM

## 2011-07-12 ENCOUNTER — Ambulatory Visit (HOSPITAL_COMMUNITY)
Admission: RE | Admit: 2011-07-12 | Discharge: 2011-07-12 | Disposition: A | Discharge status: Home / Self Care | Payer: Medicare Other | Source: Ambulatory Visit | Attending: Internal Medicine | Admitting: Internal Medicine

## 2011-07-12 DIAGNOSIS — E049 Nontoxic goiter, unspecified: Secondary | ICD-10-CM

## 2011-07-12 DIAGNOSIS — E042 Nontoxic multinodular goiter: Secondary | ICD-10-CM | POA: Insufficient documentation

## 2011-07-12 DIAGNOSIS — R490 Dysphonia: Secondary | ICD-10-CM

## 2011-07-12 DIAGNOSIS — E079 Disorder of thyroid, unspecified: Secondary | ICD-10-CM | POA: Insufficient documentation

## 2011-08-26 ENCOUNTER — Other Ambulatory Visit (HOSPITAL_COMMUNITY): Payer: Self-pay | Admitting: Endocrinology

## 2011-08-29 ENCOUNTER — Other Ambulatory Visit (HOSPITAL_COMMUNITY): Payer: Medicare Other

## 2011-09-05 ENCOUNTER — Other Ambulatory Visit (HOSPITAL_COMMUNITY): Payer: Self-pay | Admitting: Endocrinology

## 2011-09-05 ENCOUNTER — Ambulatory Visit (HOSPITAL_COMMUNITY)
Admission: RE | Admit: 2011-09-05 | Discharge: 2011-09-05 | Disposition: A | Discharge status: Home / Self Care | Payer: Medicare Other | Source: Ambulatory Visit | Attending: Endocrinology | Admitting: Endocrinology

## 2011-09-05 DIAGNOSIS — E0789 Other specified disorders of thyroid: Secondary | ICD-10-CM | POA: Insufficient documentation

## 2011-09-05 DIAGNOSIS — R229 Localized swelling, mass and lump, unspecified: Secondary | ICD-10-CM | POA: Insufficient documentation

## 2011-09-05 NOTE — Discharge Instructions (Signed)
Thyroid Biopsy The thyroid gland is a butterfly-shaped gland situated in the front of the neck. It produces hormones which affect metabolism, growth and development, and body temperature. A thyroid biopsy is a procedure in which small samples of tissue or fluid are removed from the thyroid gland or mass and examined under a microscope. This test is done to determine the cause of thyroid problems, such as infection, cancer, or other thyroid problems. There are 2 ways to obtain samples: 1. Fine needle biopsy. Samples are removed using a thin needle inserted through the skin and into the thyroid gland or mass.  2. Open biopsy. Samples are removed after a cut (incision) is made through the skin.  LET YOUR CAREGIVER KNOW ABOUT:   Allergies.   Medications taken including herbs, eye drops, over-the-counter medications, and creams.   Use of steroids (by mouth or creams).   Previous problems with anesthetics or numbing medicine.   Possibility of pregnancy, if this applies.   History of blood clots (thrombophlebitis).   History of bleeding or blood problems.   Previous surgery.   Other health problems.  RISKS AND COMPLICATIONS  Bleeding from the site. The risk of bleeding is higher if you have a bleeding disorder or are taking any blood thinning medications (anticoagulants).   Infection.   Injury to structures near the thyroid gland.  BEFORE THE PROCEDURE  This is a procedure that can be done as an outpatient. Confirm the time that you need to arrive for your procedure. Confirm whether there is a need to fast or withhold any medications. A blood sample may be done to determine your blood clotting time. Medicine may be given to help you relax (sedative). PROCEDURE Fine needle biopsy. You will be awake during the procedure. You may be asked to lie on your back with your head tipped backward to extend your neck. Let your caregiver know if you cannot tolerate the positioning. An area on your  neck will be cleansed. A needle is inserted through the skin of your neck. You may feel a mild discomfort during this procedure. You may be asked to avoid coughing, talking, swallowing, or making sounds during some portions of the procedure. The needle is withdrawn once tissue or fluid samples have been removed. Pressure may be applied to the neck to reduce swelling and ensure that bleeding has stopped. The samples will be sent for examination.  Open biopsy. You will be given general anesthesia. You will be asleep during the procedure. An incision is made in your neck. A sample of thyroid tissue or the mass is removed. The tissue sample or mass will be sent for examination. The sample or mass may be examined during the biopsy. If the sample or mass contains cancer cells, some or all of the thyroid gland may be removed. The incision is closed with stitches. AFTER THE PROCEDURE  Your recovery will be assessed and monitored. If there are no problems, as an outpatient, you should be able to go home shortly after the procedure. If you had a fine needle biopsy:  You may have soreness at the biopsy site for 1 to 2 days.  If you had an open biopsy:   You may have soreness at the biopsy site for 3 to 4 days.   You may have a hoarse voice or sore throat for 1 to 2 days.  Obtaining the Test Results It is your responsibility to obtain your test results. Do not assume everything is normal if you have   not heard from your caregiver or the medical facility. It is important for you to follow up on all of your test results. HOME CARE INSTRUCTIONS   Keeping your head raised on a pillow when you are lying down may ease biopsy site discomfort.   Supporting the back of your head and neck with both hands as you sit up from a lying position may ease biopsy site discomfort.   Only take over-the-counter or prescription medicines for pain, discomfort, or fever as directed by your caregiver.   Throat lozenges or gargling  with warm salt water may help to soothe a sore throat.  SEEK IMMEDIATE MEDICAL CARE IF:   You have severe bleeding from the biopsy site.   You have difficulty swallowing.   You have a fever.   You have increased pain, swelling, redness, or warmth at the biopsy site.   You notice pus coming from the biopsy site.   You have swollen glands (lymph nodes) in your neck.  Document Released: 12/23/2006 Document Revised: 02/14/2011 Document Reviewed: 05/25/2008 Exit[employer] Patient Information 2012 [employer]. 

## 2011-09-05 NOTE — Procedures (Signed)
PreOperative Dx: LEFT thyroid mass Postoperative Dx: LEFT thyroid mass Procedure:   US guided FNA LEFT thyroid mass Radiologist:  Thornton Papas Anesthesia:  2 ml of 2% lidocaine Specimen:  FNA x 3 LEFT thyroid mass EBL:   None Complications: None

## 2011-09-05 NOTE — Progress Notes (Signed)
Lidocaine 2%       54mL injected

## 2012-04-01 ENCOUNTER — Other Ambulatory Visit (HOSPITAL_COMMUNITY): Payer: Self-pay | Admitting: Internal Medicine

## 2012-04-01 DIAGNOSIS — Z139 Encounter for screening, unspecified: Secondary | ICD-10-CM

## 2012-05-12 ENCOUNTER — Ambulatory Visit (HOSPITAL_COMMUNITY): Payer: Medicare Other

## 2012-05-19 ENCOUNTER — Ambulatory Visit (HOSPITAL_COMMUNITY)
Admission: RE | Admit: 2012-05-19 | Discharge: 2012-05-19 | Disposition: A | Discharge status: Home / Self Care | Payer: Medicare Other | Source: Ambulatory Visit | Attending: Internal Medicine | Admitting: Internal Medicine

## 2012-05-19 DIAGNOSIS — Z139 Encounter for screening, unspecified: Secondary | ICD-10-CM

## 2012-05-19 DIAGNOSIS — Z1231 Encounter for screening mammogram for malignant neoplasm of breast: Secondary | ICD-10-CM | POA: Insufficient documentation

## 2012-05-21 ENCOUNTER — Telehealth: Payer: Self-pay

## 2012-05-21 NOTE — Telephone Encounter (Signed)
Pt is having car trouble and wants to talk to daughters and see when would be a good time to schedule. Said she will check and call me back in a few days.

## 2012-06-08 NOTE — Telephone Encounter (Signed)
Called pt. She said she will call when one of the daughters gives her a date they can bring her.

## 2012-06-09 NOTE — Telephone Encounter (Signed)
Letter to pt and to PCP.

## 2012-12-02 ENCOUNTER — Telehealth: Payer: Self-pay

## 2012-12-02 NOTE — Telephone Encounter (Signed)
Pt was referred by Dr. Gerarda Fraction for screening colonoscopy. LMOM for a return call.

## 2012-12-28 NOTE — Telephone Encounter (Signed)
Letter to pcp.

## 2013-03-17 ENCOUNTER — Other Ambulatory Visit (HOSPITAL_COMMUNITY): Payer: Self-pay | Admitting: Internal Medicine

## 2013-03-17 DIAGNOSIS — Z139 Encounter for screening, unspecified: Secondary | ICD-10-CM

## 2013-05-17 ENCOUNTER — Other Ambulatory Visit (HOSPITAL_COMMUNITY): Payer: Self-pay | Admitting: Internal Medicine

## 2013-05-17 DIAGNOSIS — Z01419 Encounter for gynecological examination (general) (routine) without abnormal findings: Secondary | ICD-10-CM

## 2013-05-25 ENCOUNTER — Ambulatory Visit (HOSPITAL_COMMUNITY)
Admission: RE | Admit: 2013-05-25 | Discharge: 2013-05-25 | Disposition: A | Discharge status: Home / Self Care | Payer: Medicare HMO | Source: Ambulatory Visit | Attending: Internal Medicine | Admitting: Internal Medicine

## 2013-05-25 DIAGNOSIS — Z13820 Encounter for screening for osteoporosis: Secondary | ICD-10-CM | POA: Insufficient documentation

## 2013-05-25 DIAGNOSIS — Z1231 Encounter for screening mammogram for malignant neoplasm of breast: Secondary | ICD-10-CM | POA: Insufficient documentation

## 2013-05-25 DIAGNOSIS — Z01419 Encounter for gynecological examination (general) (routine) without abnormal findings: Secondary | ICD-10-CM

## 2013-05-25 DIAGNOSIS — Z139 Encounter for screening, unspecified: Secondary | ICD-10-CM

## 2013-06-09 ENCOUNTER — Encounter: Payer: Self-pay | Admitting: *Deleted

## 2013-06-23 ENCOUNTER — Telehealth: Payer: Self-pay

## 2013-06-23 ENCOUNTER — Other Ambulatory Visit: Payer: Self-pay | Admitting: Adult Health

## 2013-06-23 NOTE — Telephone Encounter (Signed)
Pt is waiting on her daughters work schedule. She will call us back once she has that. She was referred by [facility] office.

## 2013-06-28 NOTE — Telephone Encounter (Signed)
Pt is scheduled and OV with [name] Crouch, PA  On 08/05/2013 at 10:00 AM for intermittent diarrhea.

## 2013-06-29 ENCOUNTER — Encounter: Payer: Self-pay | Admitting: *Deleted

## 2013-07-01 ENCOUNTER — Ambulatory Visit (INDEPENDENT_AMBULATORY_CARE_PROVIDER_SITE_OTHER): Payer: Commercial Managed Care - HMO | Admitting: Adult Health

## 2013-07-01 ENCOUNTER — Encounter: Payer: Self-pay | Admitting: Adult Health

## 2013-07-01 VITALS — BP 144/90 | Ht 64.0 in | Wt 194.0 lb

## 2013-07-01 DIAGNOSIS — N813 Complete uterovaginal prolapse: Secondary | ICD-10-CM

## 2013-07-01 DIAGNOSIS — Z4689 Encounter for fitting and adjustment of other specified devices: Secondary | ICD-10-CM

## 2013-07-01 HISTORY — DX: Encounter for fitting and adjustment of other specified devices: Z46.89

## 2013-07-01 NOTE — Patient Instructions (Signed)
Follow up in 4 months for pessary maintenance

## 2013-07-01 NOTE — Progress Notes (Signed)
Subjective:     Patient ID: April Page, female   DOB: 1945-08-01, 68 y.o.   MRN: [id number]  HPI April Page is a 68 year old black female,sp hysterectomy, needs pessary cleaned, not pap.No complaints.  Review of Systems See HPI   Reviewed past medical,surgical, social and family history. Reviewed medications and allergies.  Objective:   Physical Exam BP 144/90  Ht 5\' 4"  (1.626 m)  Wt 194 lb (87.998 kg)  BMI 33.28 kg/m2   Skin warm and dry, external genitalia normal in appearance for age, pessary removed and cleaned with soap and water and dried,vagina has good moisture,no erosions or odorous discharge, has pelvic relaxation,sp hysterectomy,cervix and uterus are absent,no masses or tenderness noted, pessary reinserted.  Assessment:    Pessary maintenance     Plan:     Return in 4 months for pessary maintenance Call prn problems

## 2013-07-08 ENCOUNTER — Ambulatory Visit: Payer: Medicare Other | Admitting: Internal Medicine

## 2013-08-05 ENCOUNTER — Other Ambulatory Visit: Payer: Self-pay | Admitting: Internal Medicine

## 2013-08-05 ENCOUNTER — Encounter (INDEPENDENT_AMBULATORY_CARE_PROVIDER_SITE_OTHER): Payer: Self-pay

## 2013-08-05 ENCOUNTER — Encounter: Payer: Self-pay | Admitting: Gastroenterology

## 2013-08-05 ENCOUNTER — Ambulatory Visit (INDEPENDENT_AMBULATORY_CARE_PROVIDER_SITE_OTHER): Payer: Medicare HMO | Admitting: Gastroenterology

## 2013-08-05 VITALS — BP 137/81 | HR 84 | Temp 98.2°F | Ht 64.0 in | Wt 192.4 lb

## 2013-08-05 DIAGNOSIS — K529 Noninfective gastroenteritis and colitis, unspecified: Secondary | ICD-10-CM

## 2013-08-05 DIAGNOSIS — R197 Diarrhea, unspecified: Secondary | ICD-10-CM

## 2013-08-05 NOTE — Progress Notes (Signed)
cc'd to pcp 

## 2013-08-05 NOTE — Progress Notes (Signed)
Primary Care Physician:  [name], MD  Primary Gastroenterologist:  Garfield Cornea, MD   Chief Complaint  Patient presents with  . Colonoscopy  . Diarrhea    HPI:  April Page is a 68 y.o. female here to schedule colonoscopy. We were asked to see patient at the request of Dr. Gerarda Fraction. She had a colonoscopy back in 2004 which revealed internal hemorrhoids but otherwise normal including normal terminal ileum. At that time she was having alternating constipation and diarrhea.  Complains of intermittent diarrhea. ?related to certain foods. Sometimes dairy sets off. No nocturnal stools. Symptoms will last for hours when occur. Happens at least couple of times per week. Imodium helps. Very little abdominal cramps/pain. +urgency. Occasional heartburn. No dysphagia. Does not eat as much as before. Weight down from last year, 220, now 192. Made dietary changes with found out about pre-diabetes.  Current Outpatient Prescriptions  Medication Sig Dispense Refill  . AMLODIPINE BESYLATE PO Take 10 mg by mouth daily.      . Multiple Vitamin (MULTIVITAMIN) tablet Take 1 tablet by mouth daily.       No current facility-administered medications for this visit.    Allergies as of 08/05/2013  . (No Known Allergies)    Past Medical History  Diagnosis Date  . GERD (gastroesophageal reflux disease)   . Hypertension   . Obesity   . Diabetes mellitus without complication   . Hyperlipidemia   . Pessary maintenance 07/01/2013    Past Surgical History  Procedure Laterality Date  . Cholecystectomy    . Abdominal hysterectomy    . Hernia repair    . Oophorectomy    . Esophagogastroduodenoscopy  06/09/2002    [id number] upper gastrointestinal tract s/p  54-French Maloney dilator  . Colonoscopy  06/09/2002    [id number] hemorrhoids; otherwise normal rectum, colon   . Cystocele/rectocele repair  2009    Family History  Problem Relation Age of Onset  . Heart disease Child 80    cardiac  arrest  . Anuerysm Mother     deceased age 28, brain anuerysm  . Early death Mother 28  . Heart disease Father   . Obesity Sister   . Obesity Brother   . Obesity Sister   . Obesity Sister   . Colon cancer Neg Hx     History   Social History  . Marital Status: Widowed    Spouse Name: N/A    Number of Children: 6  . Years of Education: associate   Occupational History  .     Social History Main Topics  . Smoking status: Never Smoker   . Smokeless tobacco: Never Used  . Alcohol Use: No  . Drug Use: No  . Sexual Activity: Not Currently    Birth Control/ Protection: Surgical   Other Topics Concern  . Not on file   Social History Narrative  . No narrative on file      ROS:  General: Negative for anorexia, unintentional weight loss, fever, chills, fatigue, weakness. Eyes: Negative for vision changes.  ENT: Negative for hoarseness, difficulty swallowing , nasal congestion. CV: Negative for chest pain, angina, palpitations, dyspnea on exertion, peripheral edema.  Respiratory: Negative for dyspnea at rest, dyspnea on exertion, cough, sputum, wheezing.  GI: See history of present illness. GU:  Negative for dysuria, hematuria, urinary incontinence, urinary frequency, nocturnal urination.  MS: Negative for joint pain, low back pain.  Derm: Negative for rash or itching.  Neuro: Negative for weakness, abnormal sensation, seizure,  frequent headaches, memory loss, confusion.  Psych: Negative for anxiety, depression, suicidal ideation, hallucinations.  Endo: Negative for unusual weight change.  Heme: Negative for bruising or bleeding. Allergy: Negative for rash or hives.    Physical Examination:  BP 137/81  Pulse 84  Temp(Src) 98.2 F (36.8 C) (Oral)  Ht 5\' 4"  (1.626 m)  Wt 192 lb 6.4 oz (87.272 kg)  BMI 33.01 kg/m2   General: Well-nourished, well-developed in no acute distress.  Head: Normocephalic, atraumatic.   Eyes: Conjunctiva pink, no icterus. Mouth:  Oropharyngeal mucosa moist and pink , no lesions erythema or exudate. Neck: Supple without thyromegaly, masses, or lymphadenopathy.  Lungs: Clear to auscultation bilaterally.  Heart: Regular rate and rhythm, no murmurs rubs or gallops.  Abdomen: Bowel sounds are normal, nontender, nondistended, no hepatosplenomegaly or masses, no abdominal bruits or    hernia , no rebound or guarding.   Rectal: deferred Extremities: No lower extremity edema. No clubbing or deformities.  Neuro: Alert and oriented x 4 , grossly normal neurologically.  Skin: Warm and dry, no rash or jaundice.   Psych: Alert and cooperative, normal mood and affect.

## 2013-08-05 NOTE — Assessment & Plan Note (Signed)
68 year old lady with history of chronic, intermittent diarrhea alternating with normal stools. Symptoms have been occurring for several months but she has had similar episodes even for several years. She states she was told she was prediabetic last year she started making some dietary changes. She has lost over 20 pounds. "Don't eat as much as I used to". Otherwise feels well. Reports that she had some abnormal blood work possibly white blood cell count with PCP he was going to refer her but she hasn't had an appointment made. We have requested those records for review. Advised patient to followup with PCP regarding this issue.  Diarrhea may be secondary to trigger foods particularly dairy per patient. Differential also includes IBS, less likely celiac or pancreatic insufficiency or IBD. Doubt infectious etiology. Recommend colonoscopy in near future.  I have discussed the risks, alternatives, benefits with regards to but not limited to the risk of reaction to medication, bleeding, infection, perforation and the patient is agreeable to proceed. Written consent to be obtained.  May use Imodium 2mg  up to TID prn for now (not during bowel prep). Further recommendations to follow after procedure.

## 2013-08-05 NOTE — Patient Instructions (Addendum)
1. Colonoscopy as scheduled. 2. I will request a copy of your most recent labs from Dr. Gerarda Fraction as discussed today.

## 2013-08-06 ENCOUNTER — Encounter: Payer: Self-pay | Admitting: Internal Medicine

## 2013-08-06 ENCOUNTER — Ambulatory Visit (INDEPENDENT_AMBULATORY_CARE_PROVIDER_SITE_OTHER): Payer: Commercial Managed Care - HMO | Admitting: Internal Medicine

## 2013-08-06 VITALS — BP 152/94 | HR 88 | Ht 64.0 in | Wt 193.7 lb

## 2013-08-06 DIAGNOSIS — E1165 Type 2 diabetes mellitus with hyperglycemia: Secondary | ICD-10-CM | POA: Insufficient documentation

## 2013-08-06 DIAGNOSIS — D72829 Elevated white blood cell count, unspecified: Secondary | ICD-10-CM | POA: Insufficient documentation

## 2013-08-06 DIAGNOSIS — R0789 Other chest pain: Secondary | ICD-10-CM

## 2013-08-06 DIAGNOSIS — IMO0002 Reserved for concepts with insufficient information to code with codable children: Secondary | ICD-10-CM | POA: Insufficient documentation

## 2013-08-06 DIAGNOSIS — IMO0001 Reserved for inherently not codable concepts without codable children: Secondary | ICD-10-CM

## 2013-08-06 NOTE — Progress Notes (Signed)
OFFICE NOTE  Chief Complaint:  Chest pain  Primary Care Physician: April Page., MD  HPI:  April Page is a 68 year old female who is referred to me by Dr. Riley Page for evaluation of atypical chest pain. I saw her in consultation for this more than 3 years ago. She's not been seen in our practice since that time. She reported borderline diabetes however has had high A1c's in the past. She recently had blood work which showed a hemoglobin A1c of 10.4. She also had a remarkably abnormal lipid profile with total cholesterol 200, triglycerides 496, HDL 35 and LDL could not be calculated. She also had a markedly abnormal CBC with a highly elevated white blood cell count 42,200, with 80% granulocytes, 33% absolute granulocytes, 11% lymphocytes and 4% monocytes. When I inquired as to treatments for this she reported she was referred to someone in [place] but did not make the appointment. She was also apparently referred to GI for atypical chest pain symptoms as there is concern for reflux. She does report her symptoms are worse after eating and not associated with exertion or relieved by rest. She was also sent to me for evaluation of coronary disease. When I saw her 3 years ago her symptoms were very similar I had recommended stress testing at that time but she declined. She was also noted to be diabetic at that time and was on medications however currently she is on no medication other than amlodipine.  PMHx:  Past Medical History  Diagnosis Date  . GERD (gastroesophageal reflux disease)   . Hypertension   . Obesity   . Diabetes mellitus without complication   . Hyperlipidemia   . Pessary maintenance 07/01/2013    Past Surgical History  Procedure Laterality Date  . Cholecystectomy    . Abdominal hysterectomy    . Hernia repair    . Oophorectomy    . Esophagogastroduodenoscopy  06/09/2002    [id number] upper gastrointestinal tract s/p  54-French Maloney dilator  . Colonoscopy   06/09/2002    [id number] hemorrhoids; otherwise normal rectum, colon   . Cystocele/rectocele repair  2009    FAMHx:  Family History  Problem Relation Age of Onset  . Heart disease Child 31    cardiac arrest  . Anuerysm Mother     deceased age 88, brain anuerysm  . Early death Mother 36  . Heart disease Father   . Obesity Sister   . Obesity Brother   . Obesity Sister   . Obesity Sister   . Colon cancer Neg Hx     SOCHx:   reports that she has never smoked. She has never used smokeless tobacco. She reports that she does not drink alcohol or use illicit drugs.  ALLERGIES:  No Known Allergies  ROS: A comprehensive review of systems was negative except for: Cardiovascular: positive for chest pressure/discomfort  HOME MEDS: Current Outpatient Prescriptions  Medication Sig Dispense Refill  . AMLODIPINE BESYLATE PO Take 10 mg by mouth daily.      . Multiple Vitamin (MULTIVITAMIN) tablet Take 1 tablet by mouth daily.       No current facility-administered medications for this visit.    LABS/IMAGING: No results found for this or any previous visit (from the past 48 hour(s)). No results found.  VITALS: BP 152/94  Pulse 88  Ht 5\' 4"  (1.626 m)  Wt 193 lb 11.2 oz (87.862 kg)  BMI 33.23 kg/m2  EXAM: General appearance: alert and no distress Neck: no carotid  bruit and no JVD Lungs: clear to auscultation bilaterally Heart: regular rate and rhythm, S1, S2 normal, no murmur, click, rub or gallop Abdomen: soft, non-tender; bowel sounds normal; no masses,  no organomegaly Extremities: extremities normal, atraumatic, no cyanosis or edema Pulses: 2+ and symmetric Skin: Skin color, texture, turgor normal. No rashes or lesions Neurologic: Grossly normal Psych: Normal mood, affect  EKG: Normal sinus rhythm at 88, no ischemia  ASSESSMENT: 1. Atypical chest pain 2. Markedly elevated white blood cell count concerning for possible leukemia 3. Poorly controlled diabetes with  A1c of 10.4-she did not appear to be on glucose lowering medications 4. Hypertension-not at goal 5.   Marked dyslipidemia  PLAN: 1.   [name] is describing somewhat atypical chest pain. Exercise stress testing is reasonable however she has more pressing issues ahead of her. Based on her laboratory work from March she had a very high white blood cell count which is concerning for possible leukemia. In addition there is poorly controlled diabetes and very high triglycerides. This is contributed to by her very high serum blood glucose. She will need prompt intervention with lowering of her blood sugars and evaluation for elevated white blood cell count. She reports she called the office to explain that she was not able to get to the appointment she was scheduled for her in [place], presumably with a hematologist. I advised her to again contact the office as she needs to be seen very soon and started on medications for her diabetes as well.  Once these issues are addressed, she has persistent chest discomfort, I would be happy to consider exercise stress testing.  April Casino, MD, April Page Attending Cardiologist [place] 08/06/2013, 4:19 PM

## 2013-08-06 NOTE — Patient Instructions (Signed)
Dr Hilty recommends that you follow-up with him as needed. 

## 2013-08-09 ENCOUNTER — Encounter: Payer: Self-pay | Admitting: Internal Medicine

## 2013-08-10 ENCOUNTER — Encounter (HOSPITAL_COMMUNITY): Payer: Self-pay | Admitting: Pharmacy Technician

## 2013-08-25 ENCOUNTER — Encounter (HOSPITAL_COMMUNITY): Payer: Self-pay | Admitting: *Deleted

## 2013-08-25 ENCOUNTER — Ambulatory Visit (HOSPITAL_COMMUNITY)
Admission: RE | Admit: 2013-08-25 | Discharge: 2013-08-25 | Disposition: A | Discharge status: Home / Self Care | Payer: Medicare HMO | Source: Ambulatory Visit | Attending: Internal Medicine | Admitting: Internal Medicine

## 2013-08-25 ENCOUNTER — Encounter (HOSPITAL_COMMUNITY)
Admission: RE | Disposition: A | Discharge status: Home / Self Care | Payer: Self-pay | Source: Ambulatory Visit | Attending: Internal Medicine

## 2013-08-25 DIAGNOSIS — E785 Hyperlipidemia, unspecified: Secondary | ICD-10-CM | POA: Insufficient documentation

## 2013-08-25 DIAGNOSIS — K219 Gastro-esophageal reflux disease without esophagitis: Secondary | ICD-10-CM | POA: Insufficient documentation

## 2013-08-25 DIAGNOSIS — I10 Essential (primary) hypertension: Secondary | ICD-10-CM | POA: Insufficient documentation

## 2013-08-25 DIAGNOSIS — R197 Diarrhea, unspecified: Secondary | ICD-10-CM

## 2013-08-25 DIAGNOSIS — K529 Noninfective gastroenteritis and colitis, unspecified: Secondary | ICD-10-CM

## 2013-08-25 DIAGNOSIS — K5731 Diverticulosis of large intestine without perforation or abscess with bleeding: Secondary | ICD-10-CM

## 2013-08-25 DIAGNOSIS — D126 Benign neoplasm of colon, unspecified: Secondary | ICD-10-CM | POA: Insufficient documentation

## 2013-08-25 DIAGNOSIS — Z86010 Personal history of colonic polyps: Secondary | ICD-10-CM

## 2013-08-25 DIAGNOSIS — K5730 Diverticulosis of large intestine without perforation or abscess without bleeding: Secondary | ICD-10-CM | POA: Insufficient documentation

## 2013-08-25 DIAGNOSIS — E119 Type 2 diabetes mellitus without complications: Secondary | ICD-10-CM | POA: Insufficient documentation

## 2013-08-25 DIAGNOSIS — K648 Other hemorrhoids: Secondary | ICD-10-CM | POA: Insufficient documentation

## 2013-08-25 DIAGNOSIS — Z79899 Other long term (current) drug therapy: Secondary | ICD-10-CM | POA: Insufficient documentation

## 2013-08-25 HISTORY — PX: COLONOSCOPY: SHX5424

## 2013-08-25 LAB — GI PATHOGEN PANEL BY PCR, STOOL
C difficile toxin A/B: NEGATIVE
CRYPTOSPORIDIUM BY PCR: NEGATIVE
Campylobacter by PCR: NEGATIVE
E COLI (ETEC) LT/ST: NEGATIVE
E coli (STEC): NEGATIVE
E coli 0157 by PCR: NEGATIVE
G lamblia by PCR: NEGATIVE
NOROVIRUS G1/G2: NEGATIVE
Rotavirus A by PCR: NEGATIVE
SHIGELLA BY PCR: POSITIVE
Salmonella by PCR: NEGATIVE

## 2013-08-25 SURGERY — COLONOSCOPY
Anesthesia: Moderate Sedation

## 2013-08-25 MED ORDER — MEPERIDINE HCL 100 MG/ML IJ SOLN
INTRAMUSCULAR | Status: DC | PRN
  Administered 2013-08-25: 50 mg via INTRAVENOUS
  Administered 2013-08-25 (×2): 25 mg via INTRAVENOUS

## 2013-08-25 MED ORDER — MEPERIDINE HCL 100 MG/ML IJ SOLN
INTRAMUSCULAR | Status: AC
  Filled 2013-08-25: qty 2

## 2013-08-25 MED ORDER — SODIUM CHLORIDE 0.9 % IV SOLN
INTRAVENOUS | Status: DC
  Administered 2013-08-25: 1000 mL via INTRAVENOUS

## 2013-08-25 MED ORDER — ONDANSETRON HCL 4 MG/2ML IJ SOLN
INTRAMUSCULAR | Status: DC | PRN
  Administered 2013-08-25: 4 mg via INTRAVENOUS

## 2013-08-25 MED ORDER — MIDAZOLAM HCL 5 MG/5ML IJ SOLN
INTRAMUSCULAR | Status: DC | PRN
  Administered 2013-08-25: 1 mg via INTRAVENOUS
  Administered 2013-08-25 (×2): 2 mg via INTRAVENOUS

## 2013-08-25 MED ORDER — MIDAZOLAM HCL 5 MG/5ML IJ SOLN
INTRAMUSCULAR | Status: DC
Start: 2013-08-25 — End: 2013-08-26
  Filled 2013-08-25: qty 10

## 2013-08-25 MED ORDER — STERILE WATER FOR IRRIGATION IR SOLN
Status: DC | PRN
  Administered 2013-08-25: 11:00:00

## 2013-08-25 MED ORDER — ONDANSETRON HCL 4 MG/2ML IJ SOLN
INTRAMUSCULAR | Status: DC
Start: 2013-08-25 — End: 2013-08-26
  Filled 2013-08-25: qty 2

## 2013-08-25 NOTE — Discharge Instructions (Addendum)
Colonoscopy Discharge Instructions  Read the instructions outlined below and refer to this sheet in the next few weeks. These discharge instructions provide you with general information on caring for yourself after you leave the hospital. Your doctor may also give you specific instructions. While your treatment has been planned according to the most current medical practices available, unavoidable complications occasionally occur. If you have any problems or questions after discharge, call Dr. Gala Romney at [phone]. ACTIVITY  You may resume your regular activity, but move at a slower pace for the next 24 hours.   Take frequent rest periods for the next 24 hours.   Walking will help get rid of the air and reduce the bloated feeling in your belly (abdomen).   No driving for 24 hours (because of the medicine (anesthesia) used during the test).    Do not sign any important legal documents or operate any machinery for 24 hours (because of the anesthesia used during the test).  NUTRITION  Drink plenty of fluids.   You may resume your normal diet as instructed by your doctor.   Begin with a light meal and progress to your normal diet. Heavy or fried foods are harder to digest and may make you feel sick to your stomach (nauseated).   Avoid alcoholic beverages for 24 hours or as instructed.  MEDICATIONS  You may resume your normal medications unless your doctor tells you otherwise.  WHAT YOU CAN EXPECT TODAY  Some feelings of bloating in the abdomen.   Passage of more gas than usual.   Spotting of blood in your stool or on the toilet paper.  IF YOU HAD POLYPS REMOVED DURING THE COLONOSCOPY:  No aspirin products for 7 days or as instructed.   No alcohol for 7 days or as instructed.   Eat a soft diet for the next 24 hours.  FINDING OUT THE RESULTS OF YOUR TEST Not all test results are available during your visit. If your test results are not back during the visit, make an appointment  with your caregiver to find out the results. Do not assume everything is normal if you have not heard from your caregiver or the medical facility. It is important for you to follow up on all of your test results.  SEEK IMMEDIATE MEDICAL ATTENTION IF:  You have more than a spotting of blood in your stool.   Your belly is swollen (abdominal distention).   You are nauseated or vomiting.   You have a temperature over 101.   You have abdominal pain or discomfort that is severe or gets worse throughout the day.    Diverticulosis and polyp information provided  Further recommendations to follow once lab results are available       Colon Polyps Polyps are lumps of extra tissue growing inside the body. Polyps can grow in the large intestine (colon). Most colon polyps are noncancerous (benign). However, some colon polyps can become cancerous over time. Polyps that are larger than a pea may be harmful. To be safe, caregivers remove and test all polyps. CAUSES  Polyps form when mutations in the genes cause your cells to grow and divide even though no more tissue is needed. RISK FACTORS There are a number of risk factors that can increase your chances of getting colon polyps. They include:  Being older than 50 years.  Family history of colon polyps or colon cancer.  Long-term colon diseases, such as colitis or Crohn disease.  Being overweight.  Smoking.  Being  inactive.  Drinking too much alcohol. SYMPTOMS  Most small polyps do not cause symptoms. If symptoms are present, they may include:  Blood in the stool. The stool may look dark red or black.  Constipation or diarrhea that lasts longer than 1 week. DIAGNOSIS People often do not know they have polyps until their caregiver finds them during a regular checkup. Your caregiver can use 4 tests to check for polyps:  Digital rectal exam. The caregiver wears gloves and feels inside the rectum. This test would find polyps only in  the rectum.  Barium enema. The caregiver puts a liquid called barium into your rectum before taking X-rays of your colon. Barium makes your colon look white. Polyps are dark, so they are easy to see in the X-ray pictures.  Sigmoidoscopy. A thin, flexible tube (sigmoidoscope) is placed into your rectum. The sigmoidoscope has a light and tiny camera in it. The caregiver uses the sigmoidoscope to look at the last third of your colon.  Colonoscopy. This test is like sigmoidoscopy, but the caregiver looks at the entire colon. This is the most common method for finding and removing polyps. TREATMENT  Any polyps will be removed during a sigmoidoscopy or colonoscopy. The polyps are then tested for cancer. PREVENTION  To help lower your risk of getting more colon polyps:  Eat plenty of fruits and vegetables. Avoid eating fatty foods.  Do not smoke.  Avoid drinking alcohol.  Exercise every day.  Lose weight if recommended by your caregiver.  Eat plenty of calcium and folate. Foods that are rich in calcium include milk, cheese, and broccoli. Foods that are rich in folate include chickpeas, kidney beans, and spinach. HOME CARE INSTRUCTIONS Keep all follow-up appointments as directed by your caregiver. You may need periodic exams to check for polyps. SEEK MEDICAL CARE IF: You notice bleeding during a bowel movement. Document Released: 11/22/2003 Document Revised: 05/20/2011 Document Reviewed: 05/07/2011 [facility] Patient Information 2015 [place], [place]. This information is not intended to replace advice given to you by your health care provider. Make sure you discuss any questions you have with your health care provider.

## 2013-08-25 NOTE — Interval H&P Note (Signed)
History and Physical Interval Note:  08/25/2013 10:31 AM  April Page  has presented today for surgery, with the diagnosis of CHRONIC DIARRHEA  The various methods of treatment have been discussed with the patient and family. After consideration of risks, benefits and other options for treatment, the patient has consented to  Procedure(s) with comments: COLONOSCOPY (N/A) - 9:30 as a surgical intervention .  The patient's history has been reviewed, patient examined, no change in status, stable for surgery.  I have reviewed the patient's chart and labs.  Questions were answered to the patient's satisfaction.     No change. Colonoscopy per plan.The risks, benefits, limitations, alternatives and imponderables have been reviewed with the patient. Questions have been answered. All parties are agreeable.   Manus Rudd

## 2013-08-25 NOTE — Op Note (Signed)
[facility] [address] [place], [address]   COLONOSCOPY PROCEDURE REPORT  PATIENT: April Page, April Page  MR#:         [id number] BIRTHDATE: [birth year] , 68  yrs. old GENDER: Female ENDOSCOPIST: R.  Garfield Cornea, MD FACP FACG REFERRED BY:  Kerin Perna, M.D. PROCEDURE DATE:  08/25/2013 PROCEDURE:     Ileocolonoscopy withsegmental biopsies and stool sampling  INDICATIONS: Chronic diarrhea  INFORMED CONSENT:  The risks, benefits, alternatives and imponderables including but not limited to bleeding, perforation as well as the possibility of a missed lesion have been reviewed.  The potential for biopsy, lesion removal, etc. have also been discussed.  Questions have been answered.  All parties agreeable. Please see the history and physical in the medical record for more information.  MEDICATIONS: Versed 5 mg IV and Demerol 100 mg IV in divided doses. Zofran 4 mg IV IV  DESCRIPTION OF PROCEDURE:  After a digital rectal exam was performed, the [id number])  colonoscope was advanced from the anus through the rectum and colon to the area of the cecum, ileocecal valve and appendiceal orifice.  The cecum was deeply intubated.  These structures were well-seen and photographed for the record.  From the level of the cecum and ileocecal valve, the scope was slowly and cautiously withdrawn.  The mucosal surfaces were carefully surveyed utilizing scope tip deflection to facilitate fold flattening as needed.  The scope was pulled down into the rectum where a thorough examination including retroflexion was performed.    FINDINGS:  Adequate preparation. Internal hemorrhoids; otherwise, normal rectum. Few scattered sigmoid diverticula; (1) 4 mm polyp in the mid ascending segment; otherwise, the remainder of the colonic mucosa appeared normal. The distal 10 cm of terminal ileal mucosa also appeared normal.  THERAPEUTIC / DIAGNOSTIC MANEUVERS PERFORMED:  The  above-mentioned polyp was cold biopsied/removed. Also, segmental biopsies of the ascending and descending segments were taken to evaluate for microscopic colitis. Finally, a stool sample was submitted for GI pathogen panel.  COMPLICATIONS: none  CECAL WITHDRAWAL TIME:  13 minutes  IMPRESSION:  Colonic diverticulosis. Single colonic polyp-removed as described above. Status post segmental biopsy and stool sampling.  RECOMMENDATIONS:   Followup on pending studies.   _______________________________ eSigned:  R. Garfield Cornea, MD FACP [facility] 08/25/2013 11:17 AM   CC:    PATIENT NAME:  [name], [name] MR#: [id number]

## 2013-08-25 NOTE — H&P (View-Only) (Signed)
Primary Care Physician:  [name], MD  Primary Gastroenterologist:  Garfield Cornea, MD   Chief Complaint  Patient presents with  . Colonoscopy  . Diarrhea    HPI:  April Page is a 68 y.o. female here to schedule colonoscopy. We were asked to see patient at the request of Dr. Gerarda Fraction. She had a colonoscopy back in 2004 which revealed internal hemorrhoids but otherwise normal including normal terminal ileum. At that time she was having alternating constipation and diarrhea.  Complains of intermittent diarrhea. ?related to certain foods. Sometimes dairy sets off. No nocturnal stools. Symptoms will last for hours when occur. Happens at least couple of times per week. Imodium helps. Very little abdominal cramps/pain. +urgency. Occasional heartburn. No dysphagia. Does not eat as much as before. Weight down from last year, 220, now 192. Made dietary changes with found out about pre-diabetes.  Current Outpatient Prescriptions  Medication Sig Dispense Refill  . AMLODIPINE BESYLATE PO Take 10 mg by mouth daily.      . Multiple Vitamin (MULTIVITAMIN) tablet Take 1 tablet by mouth daily.       No current facility-administered medications for this visit.    Allergies as of 08/05/2013  . (No Known Allergies)    Past Medical History  Diagnosis Date  . GERD (gastroesophageal reflux disease)   . Hypertension   . Obesity   . Diabetes mellitus without complication   . Hyperlipidemia   . Pessary maintenance 07/01/2013    Past Surgical History  Procedure Laterality Date  . Cholecystectomy    . Abdominal hysterectomy    . Hernia repair    . Oophorectomy    . Esophagogastroduodenoscopy  06/09/2002    [id number] upper gastrointestinal tract s/p  54-French Maloney dilator  . Colonoscopy  06/09/2002    [id number] hemorrhoids; otherwise normal rectum, colon   . Cystocele/rectocele repair  2009    Family History  Problem Relation Age of Onset  . Heart disease Child 81    cardiac  arrest  . Anuerysm Mother     deceased age 101, brain anuerysm  . Early death Mother 19  . Heart disease Father   . Obesity Sister   . Obesity Brother   . Obesity Sister   . Obesity Sister   . Colon cancer Neg Hx     History   Social History  . Marital Status: Widowed    Spouse Name: N/A    Number of Children: 6  . Years of Education: associate   Occupational History  .     Social History Main Topics  . Smoking status: Never Smoker   . Smokeless tobacco: Never Used  . Alcohol Use: No  . Drug Use: No  . Sexual Activity: Not Currently    Birth Control/ Protection: Surgical   Other Topics Concern  . Not on file   Social History Narrative  . No narrative on file      ROS:  General: Negative for anorexia, unintentional weight loss, fever, chills, fatigue, weakness. Eyes: Negative for vision changes.  ENT: Negative for hoarseness, difficulty swallowing , nasal congestion. CV: Negative for chest pain, angina, palpitations, dyspnea on exertion, peripheral edema.  Respiratory: Negative for dyspnea at rest, dyspnea on exertion, cough, sputum, wheezing.  GI: See history of present illness. GU:  Negative for dysuria, hematuria, urinary incontinence, urinary frequency, nocturnal urination.  MS: Negative for joint pain, low back pain.  Derm: Negative for rash or itching.  Neuro: Negative for weakness, abnormal sensation, seizure,  frequent headaches, memory loss, confusion.  Psych: Negative for anxiety, depression, suicidal ideation, hallucinations.  Endo: Negative for unusual weight change.  Heme: Negative for bruising or bleeding. Allergy: Negative for rash or hives.    Physical Examination:  BP 137/81  Pulse 84  Temp(Src) 98.2 F (36.8 C) (Oral)  Ht 5\' 4"  (1.626 m)  Wt 192 lb 6.4 oz (87.272 kg)  BMI 33.01 kg/m2   General: Well-nourished, well-developed in no acute distress.  Head: Normocephalic, atraumatic.   Eyes: Conjunctiva pink, no icterus. Mouth:  Oropharyngeal mucosa moist and pink , no lesions erythema or exudate. Neck: Supple without thyromegaly, masses, or lymphadenopathy.  Lungs: Clear to auscultation bilaterally.  Heart: Regular rate and rhythm, no murmurs rubs or gallops.  Abdomen: Bowel sounds are normal, nontender, nondistended, no hepatosplenomegaly or masses, no abdominal bruits or    hernia , no rebound or guarding.   Rectal: deferred Extremities: No lower extremity edema. No clubbing or deformities.  Neuro: Alert and oriented x 4 , grossly normal neurologically.  Skin: Warm and dry, no rash or jaundice.   Psych: Alert and cooperative, normal mood and affect.

## 2013-08-27 ENCOUNTER — Encounter (HOSPITAL_COMMUNITY): Payer: Self-pay | Admitting: Internal Medicine

## 2013-08-29 ENCOUNTER — Encounter: Payer: Self-pay | Admitting: Internal Medicine

## 2013-09-01 ENCOUNTER — Telehealth: Payer: Self-pay

## 2013-09-01 ENCOUNTER — Encounter: Payer: Self-pay | Admitting: Internal Medicine

## 2013-09-01 NOTE — Telephone Encounter (Signed)
Letter mailed to pt.  

## 2013-09-01 NOTE — Telephone Encounter (Signed)
Letter from: [name]  Reason for Letter: Results Review  Send letter to patient.  Send copy of letter with path to referring provider and PCP.  Need appt w extender in next several weeks to re-assess diarrhea if not already made

## 2013-09-02 NOTE — Telephone Encounter (Signed)
Results Cc to PCP  

## 2013-09-02 NOTE — Telephone Encounter (Signed)
Pt aware of OV ?

## 2013-09-02 NOTE — Telephone Encounter (Signed)
Routing to Susan. 

## 2013-09-29 ENCOUNTER — Telehealth: Payer: Self-pay | Admitting: Gastroenterology

## 2013-09-29 DIAGNOSIS — D729 Disorder of white blood cells, unspecified: Secondary | ICD-10-CM

## 2013-09-29 NOTE — Telephone Encounter (Signed)
Please find out from patient if she ever followed up with specialist for her abnormal WBC count. If she has not had it rechecked, she should have CBC with diff done.

## 2013-10-05 ENCOUNTER — Ambulatory Visit: Payer: Medicare HMO | Admitting: Gastroenterology

## 2013-10-07 ENCOUNTER — Other Ambulatory Visit: Payer: Self-pay

## 2013-10-07 DIAGNOSIS — D729 Disorder of white blood cells, unspecified: Secondary | ICD-10-CM

## 2013-10-07 NOTE — Telephone Encounter (Signed)
Pt did not go to specialist. She said they referred her to [facility] and she didn't want to go there. I asked her to have cbc done prior to her ov here with [place] on 11/04/13. She agreed and asked me to mail her the lab orders instead of sending it to the lab. Lab order in the mail to the pt.

## 2013-11-01 ENCOUNTER — Ambulatory Visit: Payer: Commercial Managed Care - HMO | Admitting: Adult Health

## 2013-11-04 ENCOUNTER — Ambulatory Visit: Payer: Medicare HMO | Admitting: Gastroenterology

## 2013-12-02 ENCOUNTER — Ambulatory Visit (INDEPENDENT_AMBULATORY_CARE_PROVIDER_SITE_OTHER): Payer: Commercial Managed Care - HMO | Admitting: Adult Health

## 2013-12-02 ENCOUNTER — Encounter: Payer: Self-pay | Admitting: Adult Health

## 2013-12-02 VITALS — BP 154/90 | Ht 66.0 in | Wt 193.0 lb

## 2013-12-02 DIAGNOSIS — N813 Complete uterovaginal prolapse: Secondary | ICD-10-CM

## 2013-12-02 DIAGNOSIS — Z4689 Encounter for fitting and adjustment of other specified devices: Secondary | ICD-10-CM

## 2013-12-02 NOTE — Progress Notes (Signed)
Subjective:     Patient ID: April Page, female   DOB: 10-Apr-1945, 68 y.o.   MRN: [id number]  HPI April Page is a 68 year old black female, in for pessary cleaning.She is happy with pessary but will sometimes notice she may be leaking and wears pantie liner.No itching or odor.  Review of Systems See HPI Reviewed past medical,surgical, social and family history. Reviewed medications and allergies.     Objective:   Physical Exam BP 154/90  Ht 5\' 6"  (1.676 m)  Wt 193 lb (87.544 kg)  BMI 31.17 kg/m2Skin warm and dry, external genitalia without lesions,pessary easily removed and cleansed with soap and water and easily inserted, no vaginal lesions or odor,no pain,she says it is comfortable.    Assessment:     Pessary maintenance    Plan:     Follow up in 4 months for cleaning or before if needed

## 2013-12-02 NOTE — Patient Instructions (Signed)
Follow for pessary maintenance in 4 months

## 2013-12-06 ENCOUNTER — Ambulatory Visit (INDEPENDENT_AMBULATORY_CARE_PROVIDER_SITE_OTHER): Payer: Commercial Managed Care - HMO | Admitting: Gastroenterology

## 2013-12-06 ENCOUNTER — Encounter: Payer: Self-pay | Admitting: Gastroenterology

## 2013-12-06 VITALS — BP 145/82 | HR 75 | Temp 98.5°F | Ht 64.0 in | Wt 192.6 lb

## 2013-12-06 DIAGNOSIS — K529 Noninfective gastroenteritis and colitis, unspecified: Secondary | ICD-10-CM

## 2013-12-06 DIAGNOSIS — K219 Gastro-esophageal reflux disease without esophagitis: Secondary | ICD-10-CM

## 2013-12-06 DIAGNOSIS — R197 Diarrhea, unspecified: Secondary | ICD-10-CM

## 2013-12-06 DIAGNOSIS — D72829 Elevated white blood cell count, unspecified: Secondary | ICD-10-CM

## 2013-12-06 MED ORDER — ESOMEPRAZOLE MAGNESIUM 20 MG PO CPDR: 20.0000 mg | DELAYED_RELEASE_CAPSULE | Freq: Every day | ORAL | Status: DC

## 2013-12-06 NOTE — Patient Instructions (Signed)
1. Please have your labs done. 2. Try Nexium one daily before breakfast for next 2-3 weeks. If no improvement in symptoms, call our office. 3. Office visit as needed.

## 2013-12-06 NOTE — Progress Notes (Signed)
      Primary Care Physician: [name], MD  Primary Gastroenterologist:  Garfield Cornea, MD    Chief Complaint  Patient presents with  . Follow-up    doing ok    HPI: April Page is a 68 y.o. female here for followup colonoscopy. She has a history of diarrhea. Negative GI pathogen panel. Random colon biopsies were unremarkable. History of leukocytosis with white blood cell count over 40,000 back in February or March. Patient never followed up with repeat CBC or hematology evaluation as recommended by her PCP. Hemoglobin A1c at that time was 10.4. Patient is not on any diabetic medications. Overall she states she feels well. Denies fatigue or fever. Minimal heartburn with meals. For a long time since she had reflux issues. Trying to be healthy. Bowel movements are regular. Denies any further diarrhea. Tends to define if she eats well. Denies any blood in the stool or melena.  Current Outpatient Prescriptions  Medication Sig Dispense Refill  . amLODipine (NORVASC) 10 MG tablet Take 10 mg by mouth daily.      . Multiple Vitamin (MULTIVITAMIN) tablet Take 1 tablet by mouth daily.       No current facility-administered medications for this visit.    Allergies as of 12/06/2013  . (No Known Allergies)    ROS:  General: Negative for anorexia, weight loss, fever, chills, fatigue, weakness. ENT: Negative for hoarseness, difficulty swallowing , nasal congestion. CV: Negative for chest pain, angina, palpitations, dyspnea on exertion, peripheral edema.  Respiratory: Negative for dyspnea at rest, dyspnea on exertion, cough, sputum, wheezing.  GI: See history of present illness. GU:  Negative for dysuria, hematuria, urinary incontinence, urinary frequency, nocturnal urination.  Endo: Negative for unusual weight change.    Physical Examination:   BP 145/82  Pulse 75  Temp(Src) 98.5 F (36.9 C) (Oral)  Ht 5\' 4"  (1.626 m)  Wt 192 lb 9.6 oz (87.363 kg)  BMI 33.04  kg/m2  General: Well-nourished, well-developed in no acute distress.  Eyes: No icterus. Mouth: Oropharyngeal mucosa moist and pink , no lesions erythema or exudate. Lungs: Clear to auscultation bilaterally.  Heart: Regular rate and rhythm, no murmurs rubs or gallops.  Abdomen: Bowel sounds are normal, nontender, nondistended, no hepatosplenomegaly or masses, no abdominal bruits or hernia , no rebound or guarding.   Extremities: No lower extremity edema. No clubbing or deformities. Neuro: Alert and oriented x 4   Skin: Warm and dry, no jaundice.   Psych: Alert and cooperative, normal mood and affect.

## 2013-12-08 NOTE — Progress Notes (Signed)
cc'ed to pcp °

## 2013-12-08 NOTE — Assessment & Plan Note (Signed)
Resolved. Likely dietary. It is notable that patient has had significant leukocytosis and hemoglobin A1c over 10. Currently not on any diabetic medications. Will discuss further with patient requested she followup with PCP for her diabetes. She will have her CBC repeated in the near future.

## 2013-12-08 NOTE — Assessment & Plan Note (Signed)
Trial of Nexium 24hr one daily for 3 weeks. Call if further problems.

## 2013-12-08 NOTE — Progress Notes (Signed)
Please NIC for screening colonoscopy in 08/2023.  Please ask patient to follow up with PCP regarding diabetes. I note her HgA1C in 05/2013 was over 10 and she is not on diabetic medication.

## 2013-12-21 NOTE — Progress Notes (Signed)
Mailed letter to pt

## 2014-01-10 ENCOUNTER — Encounter: Payer: Self-pay | Admitting: Gastroenterology

## 2014-04-04 ENCOUNTER — Ambulatory Visit: Payer: Commercial Managed Care - HMO | Admitting: Adult Health

## 2014-04-07 ENCOUNTER — Ambulatory Visit (INDEPENDENT_AMBULATORY_CARE_PROVIDER_SITE_OTHER): Payer: Commercial Managed Care - HMO | Admitting: Adult Health

## 2014-04-07 ENCOUNTER — Encounter: Payer: Self-pay | Admitting: Adult Health

## 2014-04-07 VITALS — BP 170/90 | Ht 64.0 in | Wt 191.5 lb

## 2014-04-07 DIAGNOSIS — Z4689 Encounter for fitting and adjustment of other specified devices: Secondary | ICD-10-CM

## 2014-04-07 DIAGNOSIS — N814 Uterovaginal prolapse, unspecified: Secondary | ICD-10-CM

## 2014-04-07 NOTE — Patient Instructions (Signed)
Return in 4 months for pessary maintenance

## 2014-04-07 NOTE — Progress Notes (Signed)
Subjective:     Patient ID: April Page, female   DOB: 03/25/1945, 69 y.o.   MRN: [id number]  HPI Takala is a 69 year old black female in for pessary cleaning, says she is doing good,just had 53 th birthday.Not having incontinence, still voids often but not as much.  Review of Systems See HPI Reviewed past medical,surgical, social and family history. Reviewed medications and allergies.     Objective:   Physical Exam BP 170/90 mmHg  Ht 5\' 4"  (1.626 m)  Wt 191 lb 8 oz (86.864 kg)  BMI 32.85 kg/m2   External genitalia without lesions, pessary easily removed and cleaned with soap and water and dried, no lesions or discharge on speculum exam of vagina, has prolapse.Pessary easily inserted and she says it feels right.  Assessment:     Pessary maintenance    Plan:     Return in 4 months for pessary cleaning or before if needed

## 2014-05-02 ENCOUNTER — Other Ambulatory Visit (HOSPITAL_COMMUNITY): Payer: Self-pay | Admitting: Internal Medicine

## 2014-05-02 DIAGNOSIS — Z1231 Encounter for screening mammogram for malignant neoplasm of breast: Secondary | ICD-10-CM

## 2014-05-19 DIAGNOSIS — K219 Gastro-esophageal reflux disease without esophagitis: Secondary | ICD-10-CM | POA: Diagnosis not present

## 2014-05-19 DIAGNOSIS — E6609 Other obesity due to excess calories: Secondary | ICD-10-CM | POA: Diagnosis not present

## 2014-05-19 DIAGNOSIS — E11319 Type 2 diabetes mellitus with unspecified diabetic retinopathy without macular edema: Secondary | ICD-10-CM | POA: Diagnosis not present

## 2014-05-19 DIAGNOSIS — Z0001 Encounter for general adult medical examination with abnormal findings: Secondary | ICD-10-CM | POA: Diagnosis not present

## 2014-05-19 DIAGNOSIS — E1165 Type 2 diabetes mellitus with hyperglycemia: Secondary | ICD-10-CM | POA: Diagnosis not present

## 2014-05-19 DIAGNOSIS — E782 Mixed hyperlipidemia: Secondary | ICD-10-CM | POA: Diagnosis not present

## 2014-05-19 DIAGNOSIS — Z6831 Body mass index (BMI) 31.0-31.9, adult: Secondary | ICD-10-CM | POA: Diagnosis not present

## 2014-05-19 DIAGNOSIS — I10 Essential (primary) hypertension: Secondary | ICD-10-CM | POA: Diagnosis not present

## 2014-05-23 ENCOUNTER — Other Ambulatory Visit (HOSPITAL_COMMUNITY): Payer: Self-pay | Admitting: *Deleted

## 2014-05-23 DIAGNOSIS — D72829 Elevated white blood cell count, unspecified: Secondary | ICD-10-CM

## 2014-05-25 ENCOUNTER — Other Ambulatory Visit (HOSPITAL_COMMUNITY): Payer: Medicare HMO

## 2014-05-27 ENCOUNTER — Ambulatory Visit (HOSPITAL_COMMUNITY): Payer: Medicare HMO

## 2014-05-27 ENCOUNTER — Other Ambulatory Visit (HOSPITAL_COMMUNITY): Payer: Medicare HMO

## 2014-05-30 ENCOUNTER — Ambulatory Visit (HOSPITAL_COMMUNITY)
Admission: RE | Admit: 2014-05-30 | Discharge: 2014-05-30 | Disposition: A | Discharge status: Home / Self Care | Payer: Commercial Managed Care - HMO | Source: Ambulatory Visit | Attending: Internal Medicine | Admitting: Internal Medicine

## 2014-05-30 DIAGNOSIS — Z1231 Encounter for screening mammogram for malignant neoplasm of breast: Secondary | ICD-10-CM | POA: Diagnosis not present

## 2014-06-01 ENCOUNTER — Telehealth (HOSPITAL_COMMUNITY): Payer: Self-pay

## 2014-06-01 ENCOUNTER — Encounter (HOSPITAL_COMMUNITY): Payer: Commercial Managed Care - HMO | Attending: Hematology & Oncology

## 2014-06-01 DIAGNOSIS — D72821 Monocytosis (symptomatic): Secondary | ICD-10-CM | POA: Diagnosis not present

## 2014-06-01 DIAGNOSIS — D72829 Elevated white blood cell count, unspecified: Secondary | ICD-10-CM | POA: Insufficient documentation

## 2014-06-01 DIAGNOSIS — D729 Disorder of white blood cells, unspecified: Secondary | ICD-10-CM | POA: Diagnosis not present

## 2014-06-01 LAB — CBC WITH DIFFERENTIAL/PLATELET
BASOS ABS: 3.6 10*3/uL — AB (ref 0.0–0.1)
BASOS PCT: 4 % — AB (ref 0–1)
EOS PCT: 1 % (ref 0–5)
Eosinophils Absolute: 0.5 10*3/uL (ref 0.0–0.7)
HEMATOCRIT: 42.8 % (ref 36.0–46.0)
Hemoglobin: 14 g/dL (ref 12.0–15.0)
Lymphocytes Relative: 7 % — ABNORMAL LOW (ref 12–46)
Lymphs Abs: 6.2 10*3/uL — ABNORMAL HIGH (ref 0.7–4.0)
MCH: 30.1 pg (ref 26.0–34.0)
MCHC: 32.7 g/dL (ref 30.0–36.0)
MCV: 92 fL (ref 78.0–100.0)
MONO ABS: 2.7 10*3/uL — AB (ref 0.1–1.0)
Monocytes Relative: 3 % (ref 3–12)
Neutro Abs: 76.1 10*3/uL — ABNORMAL HIGH (ref 1.7–7.7)
Neutrophils Relative %: 86 % — ABNORMAL HIGH (ref 43–77)
Platelets: 316 10*3/uL (ref 150–400)
RBC: 4.65 MIL/uL (ref 3.87–5.11)
RDW: 17.2 % — AB (ref 11.5–15.5)
WBC: 89 10*3/uL (ref 4.0–10.5)

## 2014-06-01 NOTE — Progress Notes (Signed)
LABS DRAWN

## 2014-06-01 NOTE — Telephone Encounter (Signed)
CRITICAL VALUE ALERT Critical value received:  WBC 89.0 Date of notification:  06/01/14 Time of notification: [id number] Critical value read back:  Yes.   Nurse who received alert:  Mickie Kay, RN Reported to Dr. Whitney Muse

## 2014-06-02 LAB — PATHOLOGIST SMEAR REVIEW

## 2014-06-04 LAB — BCR/ABL GENE REARRANGEMENT QNT, PCR: BCR ABL1 / ABL1 IS: 71.22 % — ABNORMAL HIGH

## 2014-06-04 LAB — P190 BCR-ABL 1: P190 BCR ABL1: NOT DETECTED

## 2014-06-04 LAB — P210 BCR-ABL 1: P210 BCR ABL1: DETECTED

## 2014-06-06 ENCOUNTER — Ambulatory Visit (HOSPITAL_COMMUNITY): Payer: Medicare HMO

## 2014-06-06 ENCOUNTER — Ambulatory Visit (HOSPITAL_COMMUNITY): Payer: Medicare HMO | Admitting: Hematology & Oncology

## 2014-06-07 LAB — JAK2 GENOTYPR

## 2014-06-16 ENCOUNTER — Encounter (HOSPITAL_COMMUNITY): Payer: Commercial Managed Care - HMO | Attending: Hematology & Oncology | Admitting: Hematology & Oncology

## 2014-06-16 ENCOUNTER — Encounter (HOSPITAL_COMMUNITY): Payer: Self-pay | Admitting: Hematology & Oncology

## 2014-06-16 VITALS — BP 148/92 | HR 90 | Temp 99.0°F | Resp 16 | Ht 64.25 in | Wt 188.0 lb

## 2014-06-16 DIAGNOSIS — D72829 Elevated white blood cell count, unspecified: Secondary | ICD-10-CM | POA: Diagnosis not present

## 2014-06-16 DIAGNOSIS — C9210 Chronic myeloid leukemia, BCR/ABL-positive, not having achieved remission: Secondary | ICD-10-CM | POA: Diagnosis not present

## 2014-06-16 LAB — COMPREHENSIVE METABOLIC PANEL
ALK PHOS: 94 U/L (ref 39–117)
ALT: 32 U/L (ref 0–35)
ANION GAP: 13 (ref 5–15)
AST: 35 U/L (ref 0–37)
Albumin: 4.5 g/dL (ref 3.5–5.2)
BILIRUBIN TOTAL: 0.6 mg/dL (ref 0.3–1.2)
BUN: 12 mg/dL (ref 6–23)
CO2: 25 mmol/L (ref 19–32)
CREATININE: 0.61 mg/dL (ref 0.50–1.10)
Calcium: 10.2 mg/dL (ref 8.4–10.5)
Chloride: 102 mmol/L (ref 96–112)
GLUCOSE: 170 mg/dL — AB (ref 70–99)
Potassium: 3.5 mmol/L (ref 3.5–5.1)
Sodium: 140 mmol/L (ref 135–145)
Total Protein: 7.9 g/dL (ref 6.0–8.3)

## 2014-06-16 NOTE — Patient Instructions (Signed)
[facility] at [facility] Discharge Instructions  RECOMMENDATIONS MADE BY THE CONSULTANT AND ANY TEST RESULTS WILL BE SENT TO YOUR REFERRING PHYSICIAN.  Exam and discussion by Dr. Penland. Need to get a bone marrow biopsy and aspiration to see how your bone marrow is at the present.  This will be done in [place] and they will contact you with the date and time of the procedure.  You will need a driver.  We will see you 7 - 14 days after the biopsy is performed.   Thank you for choosing [facility] at [facility] to provide your oncology and hematology care.  To afford each patient quality time with our provider, please arrive at least 15 minutes before your scheduled appointment time.    You need to re-schedule your appointment should you arrive 10 or more minutes late.  We strive to give you quality time with our providers, and arriving late affects you and other patients whose appointments are after yours.  Also, if you no show three or more times for appointments you may be dismissed from the clinic at the providers discretion.     Again, thank you for choosing [facility].  Our hope is that these requests will decrease the amount of time that you wait before being seen by our physicians.       _____________________________________________________________  Should you have questions after your visit to [facility], please contact our office at [phone] between the hours of 8:30 a.m. and 4:30 p.m.  Voicemails left after 4:30 p.m. will not be returned until the following business day.  For prescription refill requests, have your pharmacy contact our office.     

## 2014-07-05 ENCOUNTER — Ambulatory Visit (HOSPITAL_COMMUNITY): Payer: Commercial Managed Care - HMO

## 2014-07-05 ENCOUNTER — Other Ambulatory Visit (HOSPITAL_COMMUNITY): Payer: Commercial Managed Care - HMO

## 2014-07-06 ENCOUNTER — Ambulatory Visit (HOSPITAL_COMMUNITY): Payer: Commercial Managed Care - HMO | Admitting: Hematology & Oncology

## 2014-07-12 ENCOUNTER — Encounter (HOSPITAL_COMMUNITY): Payer: Self-pay | Admitting: Hematology & Oncology

## 2014-07-12 NOTE — Progress Notes (Signed)
[facility] NOTE  Patient Care Team: Redmond School, MD as PCP - General (Internal Medicine)  CHIEF COMPLAINTS/PURPOSE OF CONSULTATION:  CML  HISTORY OF PRESENTING ILLNESS:  April Page 69 y.o. female is here because of CML. Interestingly she was seen and followed by Dr.Neijstrom for CML. Per records she was on [place] and it was felt after several years she had a Gleevec failure. She was then switched to newer generation TKI, she states she could not afford the co-pay and therefore simply stop taking the medication and coming to her hematologist. She complains of fatigue. She otherwise feels well. She does her routine ADL's without difficulty.   She is willing to try treatment again of her CML but does not really understand why since she feels well.  MEDICAL HISTORY:  Past Medical History  Diagnosis Date  . GERD (gastroesophageal reflux disease)   . Hypertension   . Obesity   . Diabetes mellitus without complication   . Hyperlipidemia   . Pessary maintenance 07/01/2013  . H/O: CML (chronic myeloid leukemia)     SURGICAL HISTORY: Past Surgical History  Procedure Laterality Date  . Cholecystectomy    . Abdominal hysterectomy    . Hernia repair    . Oophorectomy    . Esophagogastroduodenoscopy  06/09/2002    [id number] upper gastrointestinal tract s/p  54-French Maloney dilator  . Colonoscopy  06/09/2002    [id number] hemorrhoids; otherwise normal rectum, colon   . Cystocele/rectocele repair  2009  . Colonoscopy N/A 08/25/2013    [id number] diverticulosis. Single colonic polyp-removed  s/p segmental biopsy and stool sample. random colon bx negative. +benign leiomyoma.    SOCIAL HISTORY: History   Social History  . Marital Status: Widowed    Spouse Name: N/A  . Number of Children: 6  . Years of Education: associate   Occupational History  .     Social History Main Topics  . Smoking status: Never Smoker   . Smokeless  tobacco: Never Used  . Alcohol Use: No  . Drug Use: No  . Sexual Activity: Not Currently    Birth Control/ Protection: Surgical     Comment: hyst   Other Topics Concern  . Not on file   Social History Narrative    FAMILY HISTORY: Family History  Problem Relation Age of Onset  . Heart disease Child 48    cardiac arrest  . Anuerysm Mother     deceased age 65, brain anuerysm  . Early death Mother 65  . Heart disease Father   . Hypertension Sister   . Obesity Brother   . Hypertension Sister   . Arthritis Sister   . Colon cancer Neg Hx    indicated that her mother is deceased. She indicated that her father is deceased. She indicated that all of her three sisters are alive. She indicated that her brother is alive. She indicated that her maternal grandmother is deceased. She indicated that her maternal grandfather is deceased. She indicated that her paternal grandmother is deceased. She indicated that her paternal grandfather is deceased. She indicated that all of her four daughters are alive. She indicated that only one of her two sons is alive. She indicated that her child is deceased.   ALLERGIES:  has No Known Allergies.  MEDICATIONS:  Current Outpatient Prescriptions  Medication Sig Dispense Refill  . Acetaminophen (TYLENOL EX ST ARTHRITIS PAIN PO) Take 2 tablets by mouth as needed.    Marland Kitchen amLODipine (  NORVASC) 10 MG tablet Take 10 mg by mouth daily.    . Multiple Vitamin (MULTIVITAMIN) tablet Take 1 tablet by mouth daily.    . Naproxen Sodium (ALEVE PO) Take 1 capsule by mouth as needed.     No current facility-administered medications for this visit.    Review of Systems  Constitutional: Positive for malaise/fatigue. Negative for fever, chills and weight loss.  HENT: Negative for congestion, hearing loss, nosebleeds, sore throat and tinnitus.   Eyes: Negative for blurred vision, double vision, pain and discharge.  Respiratory: Negative for cough, hemoptysis, sputum  production, shortness of breath and wheezing.   Cardiovascular: Negative for chest pain, palpitations, claudication, leg swelling and PND.  Gastrointestinal: Negative for heartburn, nausea, vomiting, abdominal pain, diarrhea, constipation, blood in stool and melena.  Genitourinary: Negative for dysuria, urgency, frequency and hematuria.  Musculoskeletal: Negative for myalgias, joint pain and falls.  Skin: Negative for itching and rash.  Neurological: Positive for weakness. Negative for dizziness, tingling, tremors, sensory change, speech change, focal weakness, seizures, loss of consciousness and headaches.  Endo/Heme/Allergies: Does not bruise/bleed easily.  Psychiatric/Behavioral: Negative for depression, suicidal ideas, memory loss and substance abuse. The patient is not nervous/anxious and does not have insomnia.    14 point ROS was done and is otherwise as detailed above or in HPI   PHYSICAL EXAMINATION: ECOG PERFORMANCE STATUS: 1 - Symptomatic but completely ambulatory  Filed Vitals:   06/16/14 1456  BP: 148/92  Pulse: 90  Temp: 99 F (37.2 C)  Resp: 16   Filed Weights   06/16/14 1456  Weight: 188 lb (85.276 kg)     Physical Exam  Constitutional: She is oriented to person, place, and time and well-developed, well-nourished, and in no distress.  HENT:  Head: Normocephalic and atraumatic.  Nose: Nose normal.  Mouth/Throat: Oropharynx is clear and moist. No oropharyngeal exudate.  Eyes: Conjunctivae and EOM are normal. Pupils are equal, round, and reactive to light. Right eye exhibits no discharge. Left eye exhibits no discharge. No scleral icterus.  Neck: Normal range of motion. Neck supple. No tracheal deviation present. No thyromegaly present.  Cardiovascular: Normal rate, regular rhythm and normal heart sounds.  Exam reveals no gallop and no friction rub.   No murmur heard. Pulmonary/Chest: Effort normal and breath sounds normal. She has no wheezes. She has no rales.    Abdominal: Soft. Bowel sounds are normal. She exhibits no distension and no mass. There is no tenderness. There is no rebound and no guarding.  Musculoskeletal: Normal range of motion. She exhibits no edema.  Lymphadenopathy:    She has no cervical adenopathy.  Neurological: She is alert and oriented to person, place, and time. She has normal reflexes. No cranial nerve deficit. Gait normal. Coordination normal.  Skin: Skin is warm and dry. No rash noted.  Psychiatric: Mood, memory, affect and judgment normal.  Nursing note and vitals reviewed.     LABORATORY DATA:  I have reviewed the data as listed Bcr/abl gene rearrangement qnt, PCR  Status: Finalresult Visible to patient:  Not Released Nextappt: 07/14/2014 at 09:00 AM in No Specialty [facility] Room) Dx:  Leukocytosis            Ref Range 39moago    BCR ABL1 / ABL1 IS % 71.220 (H)   Interpretation - BCRQ  REPORT   Comments: (NOTE)  The P210 BCR-ABL1 fusion transcript is detected.  Reverse transcription real-time PCR is performed  for the P210 BCR-ABL1 transcript associated with  the  t(9;22) chromosomal translocation usually seen  in chronic myelogenous leukemia. Results are expressed  as a percent ratio of BCR-ABL1 to ABL1 and further  adjusted to the international scale (IS).  Optimal therapy in CML is associated with levels of  BCR-ABL1 below the major molecular response (MMR)  milestone indicated by a BCR-ABL1/ABL1 value of 0.1%  (IS). Reference range is 0.000% BCR-ABL1/ABL1.  This test was developed and its analytical  performance characteristics have been determined  by [employer], [place], [place].  It has not been cleared or approved by the FDA. This  assay has been validated pursuant to the CLIA  regulations and is used for clinical purposes.                 Neldon Labella, M.D.  Staff Pathologist  Performed at [facility]  for ELSE, [place] (MRN [id number]) as of 07/12/2014 12:09  Ref. Range 06/01/2014 09:59  WBC Latest Ref Range: 4.0-10.5 K/uL 89.0 (HH)  RBC Latest Ref Range: 3.87-5.11 MIL/uL 4.65  Hemoglobin Latest Ref Range: 12.0-15.0 g/dL 14.0  HCT Latest Ref Range: 36.0-46.0 % 42.8  MCV Latest Ref Range: 78.0-100.0 fL 92.0  MCH Latest Ref Range: 26.0-34.0 pg 30.1  MCHC Latest Ref Range: 30.0-36.0 g/dL 32.7  RDW Latest Ref Range: 11.5-15.5 % 17.2 (H)  Platelets Latest Ref Range: 150-400 K/uL 316  Neutrophils Latest Ref Range: 43-77 % 86 (H)  Lymphocytes Latest Ref Range: 12-46 % 7 (L)  Monocytes Relative Latest Ref Range: 3-12 % 3  Eosinophil Latest Ref Range: 0-5 % 1  Basophil Latest Ref Range: 0-1 % 4 (H)  NEUT# Latest Ref Range: 1.7-7.7 K/uL 76.1 (H)  Lymphocyte # Latest Ref Range: 0.7-4.0 K/uL 6.2 (H)  Monocyte # Latest Ref Range: 0.1-1.0 K/uL 2.7 (H)  Eosinophils Absolute Latest Ref Range: 0.0-0.7 K/uL 0.5  Basophils Absolute Latest Ref Range: 0.0-0.1 K/uL 3.6 (H)  WBC Morphology Unknown MARKED LEFT SHIFT...     P210 BCR-ABL 1  Status: Finalresult Visible to patient:  Not Released Nextappt: 07/14/2014 at 09:00 AM in No Specialty [facility] Room)         58moago    P210 BCR ABL1 Detected   Comments: [facility]formed at [facility]      Specimen Collected: 06/01/14 9:59 AM        Pathologist smear review  Status: Finalresult Visible to patient:  Not Released Nextappt: 07/14/2014 at 09:00 AM [facility] Dx:  Leukocytosis         135mogo    Path Review Reviewed by JuKalman DrapeM.D.   Comments: 03.24.16  MARKED LEUKOCYTOSIS WITH NEUTROPHILIA,  LEFT SHIFT, BASOPHILIA, AND MONOCYTOSIS.  SUGGEST HEMATOLOGIC WORK UP.  Performed at [facility]      Specimen Collected: 06/01/14 10:07 AM Last         P190 BCR-ABL 1  Status:  Finalresult Visible to patient:  Not Released Nextappt: 07/14/2014 at 09:00 AM in No [facility])         101m10moo    P190 BCR ABL1 Not Detected   Comments: [facility]formed at [facility]      Specimen Collected: 06/01/14 9:59 AM         ASSESSMENT & PLAN:  CML originally diagnosed in 2010 Off therapy for several years  We spent time today discussing CML and the importance of staying compliant with therapy. I reviewed the natural course of the disease without therapy. I advised her that there is some any assistance programs out there for co-pays that we can find her appropriate financial assistance. She is willing to proceed with needed evaluation. She is also willing to go back on therapy. We discussed obtaining another bone marrow biopsy and she is willing but would like to schedule this after an upcoming trip. We will therefore schedule it for the end of the month. I will see her back about one week post at which time we will make recommendations for getting her back on therapy. Also pleural Dr. Eloy End paper chart to see all of her past history in regards to treatment and follow-up.   Orders Placed This Encounter  Procedures  . CT Biopsy    Standing Status: Future     Number of Occurrences:      Standing Expiration Date: 06/16/2015    Order Specific Question:  Lab orders requested (DO NOT place separate lab orders, these will be automatically ordered during procedure specimen collection):    Answer:  Surgical Pathology    Order Specific Question:  Reason for Exam (SYMPTOM  OR DIAGNOSIS REQUIRED)    Answer:  CML, flow and cytogenetics    Order Specific Question:  Preferred imaging location?    Answer:  [facility]  . Comprehensive metabolic panel    Standing Status: Future     Number of Occurrences: 1     Standing Expiration Date: 06/16/2015    All questions were answered. The patient knows to call the  clinic with any problems, questions or concerns.  This note was electronically signed.    Molli Hazard, MD  07/12/2014 11:57 AM

## 2014-07-13 ENCOUNTER — Other Ambulatory Visit: Payer: Self-pay | Admitting: Radiology

## 2014-07-14 ENCOUNTER — Encounter (HOSPITAL_COMMUNITY): Payer: Self-pay

## 2014-07-14 ENCOUNTER — Ambulatory Visit (HOSPITAL_COMMUNITY)
Admission: RE | Admit: 2014-07-14 | Discharge: 2014-07-14 | Disposition: A | Discharge status: Home / Self Care | Payer: Commercial Managed Care - HMO | Source: Ambulatory Visit | Attending: Hematology & Oncology | Admitting: Hematology & Oncology

## 2014-07-14 DIAGNOSIS — C9290 Myeloid leukemia, unspecified, not having achieved remission: Secondary | ICD-10-CM | POA: Diagnosis present

## 2014-07-14 DIAGNOSIS — I10 Essential (primary) hypertension: Secondary | ICD-10-CM | POA: Diagnosis not present

## 2014-07-14 DIAGNOSIS — E785 Hyperlipidemia, unspecified: Secondary | ICD-10-CM | POA: Insufficient documentation

## 2014-07-14 DIAGNOSIS — C9210 Chronic myeloid leukemia, BCR/ABL-positive, not having achieved remission: Secondary | ICD-10-CM | POA: Insufficient documentation

## 2014-07-14 HISTORY — DX: Malignant (primary) neoplasm, unspecified: C80.1

## 2014-07-14 HISTORY — DX: Disease of blood and blood-forming organs, unspecified: D75.9

## 2014-07-14 HISTORY — DX: Unspecified osteoarthritis, unspecified site: M19.90

## 2014-07-14 LAB — CBC WITH DIFFERENTIAL/PLATELET
Basophils Absolute: 2.6 10*3/uL — ABNORMAL HIGH (ref 0.0–0.1)
Basophils Relative: 4 % — ABNORMAL HIGH (ref 0–1)
EOS ABS: 0.7 10*3/uL (ref 0.0–0.7)
Eosinophils Relative: 1 % (ref 0–5)
HCT: 40.7 % (ref 36.0–46.0)
Hemoglobin: 13 g/dL (ref 12.0–15.0)
LYMPHS ABS: 4.6 10*3/uL — AB (ref 0.7–4.0)
Lymphocytes Relative: 7 % — ABNORMAL LOW (ref 12–46)
MCH: 29.5 pg (ref 26.0–34.0)
MCHC: 31.9 g/dL (ref 30.0–36.0)
MCV: 92.5 fL (ref 78.0–100.0)
MONO ABS: 2 10*3/uL — AB (ref 0.1–1.0)
MONOS PCT: 3 % (ref 3–12)
NEUTROS ABS: 55.9 10*3/uL — AB (ref 1.7–7.7)
Neutrophils Relative %: 85 % — ABNORMAL HIGH (ref 43–77)
PLATELETS: 307 10*3/uL (ref 150–400)
RBC: 4.4 MIL/uL (ref 3.87–5.11)
RDW: 17 % — AB (ref 11.5–15.5)
WBC: 65.8 10*3/uL — AB (ref 4.0–10.5)

## 2014-07-14 LAB — APTT: APTT: 29 s (ref 24–37)

## 2014-07-14 LAB — BONE MARROW EXAM

## 2014-07-14 LAB — PROTIME-INR
INR: 1.03 (ref 0.00–1.49)
Prothrombin Time: 13.6 seconds (ref 11.6–15.2)

## 2014-07-14 MED ORDER — SODIUM CHLORIDE 0.9 % IV SOLN
INTRAVENOUS | Status: DC
  Administered 2014-07-14: 500 mL via INTRAVENOUS

## 2014-07-14 MED ORDER — MIDAZOLAM HCL 2 MG/2ML IJ SOLN
INTRAMUSCULAR | Status: AC | PRN
  Administered 2014-07-14 (×5): 1 mg via INTRAVENOUS

## 2014-07-14 MED ORDER — FENTANYL CITRATE (PF) 100 MCG/2ML IJ SOLN
INTRAMUSCULAR | Status: AC | PRN
  Administered 2014-07-14 (×2): 25 ug via INTRAVENOUS
  Administered 2014-07-14: 50 ug via INTRAVENOUS

## 2014-07-14 MED ORDER — FENTANYL CITRATE (PF) 100 MCG/2ML IJ SOLN
INTRAMUSCULAR | Status: AC
  Filled 2014-07-14: qty 4

## 2014-07-14 MED ORDER — MIDAZOLAM HCL 2 MG/2ML IJ SOLN
INTRAMUSCULAR | Status: AC
  Filled 2014-07-14: qty 6

## 2014-07-14 NOTE — Procedures (Signed)
Successful RT ILIAC BM ASP AND CORE BX NO COMP STABLE PATH PENDING FULL REPORT IN PACS

## 2014-07-14 NOTE — H&P (Signed)
Chief Complaint: Chronic Myeloid Leukemia  Referring Physician(s): Penland,Shannon K  History of Present Illness: April Page is a 69 y.o. female   Dx CML approx 10 yrs ago Was compliant with meds and appts with Dr Tressie Stalker for 5 yrs Last 5 yrs pt has stopped taking meds and not following with MD Complains of fatigue and visits PMD Now followed with Dr Whitney Muse Agreeable to Bone Marrow Bx and to restart treatment  Past Medical History  Diagnosis Date  . GERD (gastroesophageal reflux disease)   . Hypertension   . Obesity   . Hyperlipidemia   . Pessary maintenance 07/01/2013  . H/O: CML (chronic myeloid leukemia)   . Cancer   . Blood dyscrasia   . Arthritis     stiff knees    Past Surgical History  Procedure Laterality Date  . Cholecystectomy    . Abdominal hysterectomy    . Hernia repair    . Oophorectomy    . Esophagogastroduodenoscopy  06/09/2002    [id number] upper gastrointestinal tract s/p  54-French Maloney dilator  . Colonoscopy  06/09/2002    [id number] hemorrhoids; otherwise normal rectum, colon   . Cystocele/rectocele repair  2009  . Colonoscopy N/A 08/25/2013    [id number] diverticulosis. Single colonic polyp-removed  s/p segmental biopsy and stool sample. random colon bx negative. +benign leiomyoma.    Allergies: Review of patient's allergies indicates no known allergies.  Medications: Prior to Admission medications   Medication Sig Start Date End Date Taking? Authorizing Provider  amLODipine (NORVASC) 10 MG tablet Take 10 mg by mouth daily.   Yes Historical Provider, MD  Multiple Vitamin (MULTIVITAMIN WITH MINERALS) TABS tablet Take 1 tablet by mouth daily.   Yes Historical Provider, MD  naproxen sodium (ANAPROX) 220 MG tablet Take 220 mg by mouth 2 (two) times daily as needed (for pain).   Yes Historical Provider, MD     Family History  Problem Relation Age of Onset  . Heart disease Child 28    cardiac arrest  . Anuerysm Mother    deceased age 59, brain anuerysm  . Early death Mother 32  . Heart disease Father   . Hypertension Sister   . Obesity Brother   . Hypertension Sister   . Arthritis Sister   . Colon cancer Neg Hx     History   Social History  . Marital Status: Widowed    Spouse Name: N/A  . Number of Children: 6  . Years of Education: associate   Occupational History  .     Social History Main Topics  . Smoking status: Never Smoker   . Smokeless tobacco: Never Used  . Alcohol Use: No  . Drug Use: No  . Sexual Activity: Not Currently    Birth Control/ Protection: Surgical     Comment: hyst   Other Topics Concern  . None   Social History Narrative    Review of Systems: A 12 point ROS discussed and pertinent positives are indicated in the HPI above.  All other systems are negative.  Review of Systems  Constitutional: Positive for activity change and fatigue. Negative for fever.  Respiratory: Negative for cough and shortness of breath.   Gastrointestinal: Negative for abdominal pain.  Musculoskeletal: Negative for back pain.  Neurological: Positive for weakness.  Psychiatric/Behavioral: Negative for behavioral problems and confusion.    Vital Signs: BP 171/84 mmHg  Pulse 85  Temp(Src) 98.3 F (36.8 C) (Oral)  Resp 18  SpO2 100%  Physical  Exam  Constitutional: She is oriented to person, place, and time. She appears well-developed and well-nourished.  Cardiovascular: Normal rate, regular rhythm and normal heart sounds.   No murmur heard. Pulmonary/Chest: Effort normal and breath sounds normal. She has no wheezes.  Abdominal: Soft. Bowel sounds are normal. There is no tenderness.  Musculoskeletal: Normal range of motion.  Neurological: She is alert and oriented to person, place, and time.  Skin: Skin is warm and dry.  Psychiatric: She has a normal mood and affect. Her behavior is normal. Judgment and thought content normal.  Nursing note and vitals reviewed.   Mallampati  Score:  MD Evaluation Airway: WNL Heart: WNL Abdomen: WNL Chest/ Lungs: WNL ASA  Classification: 3 Mallampati/Airway Score: Two  Imaging: No results found.  Labs:  CBC:  Recent Labs  06/01/14 0959 07/14/14 0900  WBC 89.0* 65.8*  HGB 14.0 13.0  HCT 42.8 40.7  PLT 316 307    COAGS:  Recent Labs  07/14/14 0900  INR 1.03  APTT 29    BMP:  Recent Labs  06/16/14 1715  NA 140  K 3.5  CL 102  CO2 25  GLUCOSE 170*  BUN 12  CALCIUM 10.2  CREATININE 0.61  GFRNONAA >90  GFRAA >90    LIVER FUNCTION TESTS:  Recent Labs  06/16/14 1715  BILITOT 0.6  AST 35  ALT 32  ALKPHOS 94  PROT 7.9  ALBUMIN 4.5    TUMOR MARKERS: No results for input(s): AFPTM, CEA, CA199, CHROMGRNA in the last 8760 hours.  Assessment and Plan:  Hx Chronic Myeloid Leukemia Non compliance for over 5 yrs Now followed with Dr Whitney Muse Scheduled now for BM bx and to restart treatment Risks and Benefits discussed with the patient including, but not limited to bleeding, infection, damage to adjacent structures or low yield requiring additional tests. All of the patient's questions were answered, patient is agreeable to proceed. Consent signed and in chart.   Thank you for this interesting consult.  I greatly enjoyed meeting CHOLE RION and look forward to participating in their care.  Signed: Cailey Trigueros A 07/14/2014, 9:50 AM   I spent a total of  20 Minutes   in face to face in clinical consultation, greater than 50% of which was counseling/coordinating care for BM bx

## 2014-07-14 NOTE — Discharge Instructions (Signed)
Conscious Sedation Sedation is the use of medicines to promote relaxation and relieve discomfort and anxiety. Conscious sedation is a type of sedation. Under conscious sedation you are less alert than normal but are still able to respond to instructions or stimulation. Conscious sedation is used during short medical and dental procedures. It is milder than deep sedation or general anesthesia and allows you to return to your regular activities sooner.  LET [facility] CARE PROVIDER KNOW ABOUT:   Any allergies you have.  All medicines you are taking, including vitamins, herbs, eye drops, creams, and over-the-counter medicines.  Use of steroids (by mouth or creams).  Previous problems you or members of your family have had with the use of anesthetics.  Any blood disorders you have.  Previous surgeries you have had.  Medical conditions you have.  Possibility of pregnancy, if this applies.  Use of cigarettes, alcohol, or illegal drugs. RISKS AND COMPLICATIONS Generally, this is a safe procedure. However, as with any procedure, problems can occur. Possible problems include:  Oversedation.  Trouble breathing on your own. You may need to have a breathing tube until you are awake and breathing on your own.  Allergic reaction to any of the medicines used for the procedure. BEFORE THE PROCEDURE  You may have blood tests done. These tests can help show how well your kidneys and liver are working. They can also show how well your blood clots.  A physical exam will be done.  Only take medicines as directed by your health care provider. You may need to stop taking medicines (such as blood thinners, aspirin, or nonsteroidal anti-inflammatory drugs) before the procedure.   Do not eat or drink at least 6 hours before the procedure or as directed by your health care provider.  Arrange for a responsible adult, family member, or friend to take you home after the procedure. He or she should stay  with you for at least 24 hours after the procedure, until the medicine has worn off. PROCEDURE   An intravenous (IV) catheter will be inserted into one of your veins. Medicine will be able to flow directly into your body through this catheter. You may be given medicine through this tube to help prevent pain and help you relax.  The medical or dental procedure will be done. AFTER THE PROCEDURE  You will stay in a recovery area until the medicine has worn off. Your blood pressure and pulse will be checked.   Depending on the procedure you had, you may be allowed to go home when you can tolerate liquids and your pain is under control. Document Released: 11/20/2000 Document Revised: 03/02/2013 Document Reviewed: 11/02/2012 [facility] Patient Information 2015 [place], [place]. This information is not intended to replace advice given to you by your health care provider. Make sure you discuss any questions you have with your health care provider. Bone Marrow Aspiration, Bone Marrow Biopsy Care After Read the instructions outlined below and refer to this sheet in the next few weeks. These discharge instructions provide you with general information on caring for yourself after you leave the hospital. Your caregiver may also give you specific instructions. While your treatment has been planned according to the most current medical practices available, unavoidable complications occasionally occur. If you have any problems or questions after discharge, call your caregiver. FINDING OUT THE RESULTS OF YOUR TEST Not all test results are available during your visit. If your test results are not back during the visit, make an appointment with your  your caregiver to find out the results. Do not assume everything is normal if you have not heard from your caregiver or the medical facility. It is important for you to follow up on all of your test results.  °HOME CARE INSTRUCTIONS  °You have had sedation and may be sleepy or  dizzy. Your thinking may not be as clear as usual. For the next 24 hours: °· Only take over-the-counter or prescription medicines for pain, discomfort, and or fever as directed by your caregiver. °· Do not drink alcohol. °· Do not smoke. °· Do not drive. °· Do not make important legal decisions. °· Do not operate heavy machinery. °· Do not care for small children by yourself. °· Keep your dressing clean and dry. You may replace dressing with a bandage after 24 hours. °· You may take a bath or shower after 24 hours. °· Use an ice pack for 20 minutes every 2 hours while awake for pain as needed. °SEEK MEDICAL CARE IF:  °· There is redness, swelling, or increasing pain at the biopsy site. °· There is pus coming from the biopsy site. °· There is drainage from a biopsy site lasting longer than one day. °· An unexplained oral temperature above 102° F (38.9° C) develops. °SEEK IMMEDIATE MEDICAL CARE IF:  °· You develop a rash. °· You have difficulty breathing. °· You develop any reaction or side effects to medications given. °Document Released: 09/14/2004 Document Revised: 05/20/2011 Document Reviewed: 02/23/2008 °Exit[employer]® Patient Information ©2015 [employer]. This information is not intended to replace advice given to you by your health care provider. Make sure you discuss any questions you have with your health care provider. ° °

## 2014-07-14 NOTE — Progress Notes (Signed)
CRITICAL VALUE ALERT  Critical value received: WBC 65.8  Date of notification:  07/14/14  Time of notification:  0913 Critical value read back:Yes.    Nurse who received alert:  Beatris Si RN  MD notified (1st page): Jannifer Franklin PA Radiology Time of first page:  [phone]  Spoke with her and gave her results MD notified (2nd page):  Time of second page:  Responding MD: n/a  Time MD responded:  n/a

## 2014-07-20 ENCOUNTER — Encounter (HOSPITAL_COMMUNITY): Payer: Self-pay | Admitting: Hematology & Oncology

## 2014-07-20 ENCOUNTER — Other Ambulatory Visit: Payer: Self-pay

## 2014-07-20 ENCOUNTER — Encounter (HOSPITAL_COMMUNITY): Payer: Commercial Managed Care - HMO | Attending: Hematology & Oncology | Admitting: Hematology & Oncology

## 2014-07-20 VITALS — BP 151/75 | HR 86 | Temp 98.9°F | Resp 16 | Wt 188.7 lb

## 2014-07-20 DIAGNOSIS — C9210 Chronic myeloid leukemia, BCR/ABL-positive, not having achieved remission: Secondary | ICD-10-CM | POA: Diagnosis not present

## 2014-07-20 DIAGNOSIS — Z79899 Other long term (current) drug therapy: Secondary | ICD-10-CM | POA: Diagnosis not present

## 2014-07-20 DIAGNOSIS — R61 Generalized hyperhidrosis: Secondary | ICD-10-CM

## 2014-07-20 DIAGNOSIS — D72829 Elevated white blood cell count, unspecified: Secondary | ICD-10-CM | POA: Diagnosis not present

## 2014-07-20 DIAGNOSIS — R5383 Other fatigue: Secondary | ICD-10-CM

## 2014-07-20 LAB — COMPREHENSIVE METABOLIC PANEL
ALT: 29 U/L (ref 14–54)
ANION GAP: 12 (ref 5–15)
AST: 30 U/L (ref 15–41)
Albumin: 4.5 g/dL (ref 3.5–5.0)
Alkaline Phosphatase: 88 U/L (ref 38–126)
BUN: 13 mg/dL (ref 6–20)
CO2: 26 mmol/L (ref 22–32)
Calcium: 9.6 mg/dL (ref 8.9–10.3)
Chloride: 101 mmol/L (ref 101–111)
Creatinine, Ser: 0.67 mg/dL (ref 0.44–1.00)
GFR calc non Af Amer: >60 mL/min (ref >60)
Glucose, Bld: 256 mg/dL — ABNORMAL HIGH (ref 70–99)
Potassium: 3.8 mmol/L (ref 3.5–5.1)
SODIUM: 139 mmol/L (ref 135–145)
TOTAL PROTEIN: 7.8 g/dL (ref 6.5–8.1)
Total Bilirubin: 0.6 mg/dL (ref 0.3–1.2)

## 2014-07-20 LAB — CBC WITH DIFFERENTIAL/PLATELET
BASOS ABS: 2.9 10*3/uL — AB (ref 0.0–0.1)
Basophils Relative: 4 % — ABNORMAL HIGH (ref 0–1)
EOS PCT: 1 % (ref 0–5)
Eosinophils Absolute: 0.4 10*3/uL (ref 0.0–0.7)
HEMATOCRIT: 41.3 % (ref 36.0–46.0)
HEMOGLOBIN: 13.5 g/dL (ref 12.0–15.0)
LYMPHS ABS: 5 10*3/uL — AB (ref 0.7–4.0)
Lymphocytes Relative: 7 % — ABNORMAL LOW (ref 12–46)
MCH: 30.2 pg (ref 26.0–34.0)
MCHC: 32.7 g/dL (ref 30.0–36.0)
MCV: 92.4 fL (ref 78.0–100.0)
MONOS PCT: 4 % (ref 3–12)
Monocytes Absolute: 2.7 10*3/uL — ABNORMAL HIGH (ref 0.1–1.0)
Neutro Abs: 58.4 10*3/uL — ABNORMAL HIGH (ref 1.7–7.7)
Neutrophils Relative %: 84 % — ABNORMAL HIGH (ref 43–77)
Platelets: 315 10*3/uL (ref 150–400)
RBC: 4.47 MIL/uL (ref 3.87–5.11)
RDW: 17.1 % — ABNORMAL HIGH (ref 11.5–15.5)
WBC: 69.4 10*3/uL (ref 4.0–10.5)

## 2014-07-20 LAB — MAGNESIUM: MAGNESIUM: 1.9 mg/dL (ref 1.7–2.4)

## 2014-07-20 MED ORDER — ONDANSETRON HCL 8 MG PO TABS: 8.0000 mg | ORAL_TABLET | Freq: Three times a day (TID) | ORAL | Status: DC | PRN

## 2014-07-20 MED ORDER — NILOTINIB HCL 150 MG PO CAPS: 300.0000 mg | ORAL_CAPSULE | Freq: Two times a day (BID) | ORAL | Status: DC

## 2014-07-20 NOTE — Progress Notes (Signed)
[facility] NOTE  Patient Care Team: Redmond School, MD as PCP - General (Internal Medicine)  CHIEF COMPLAINTS/PURPOSE OF CONSULTATION:  CML  HISTORY OF PRESENTING ILLNESS:  April Page 69 y.o. female is here because of CML. Interestingly she was seen and followed by Dr.Neijstrom for CML. Per records she was on [place] and it was felt after several years she had a Gleevec failure. She was then switched to newer generation TKI, she states she could not afford the co-pay and therefore simply stop taking the medication and coming to her hematologist.  She complains of fatigue and sweating. She otherwise feels well. She is here today to review her bone marrow biopsy her daughters are with her today. She states she is willing to take therapy. He clearly does not have a good understanding of her disease.   MEDICAL HISTORY:  Past Medical History  Diagnosis Date  . GERD (gastroesophageal reflux disease)   . Hypertension   . Obesity   . Hyperlipidemia   . Pessary maintenance 07/01/2013  . H/O: CML (chronic myeloid leukemia)   . Cancer   . Blood dyscrasia   . Arthritis     stiff knees    SURGICAL HISTORY: Past Surgical History  Procedure Laterality Date  . Cholecystectomy    . Abdominal hysterectomy    . Hernia repair    . Oophorectomy    . Esophagogastroduodenoscopy  06/09/2002    TMY:TRZNBV upper gastrointestinal tract s/p  54-French Maloney dilator  . Colonoscopy  06/09/2002    APO:LIDCVUDT hemorrhoids; otherwise normal rectum, colon   . Cystocele/rectocele repair  2009  . Colonoscopy N/A 08/25/2013    HYH:OOILNZV diverticulosis. Single colonic polyp-removed  s/p segmental biopsy and stool sample. random colon bx negative. +benign leiomyoma.    SOCIAL HISTORY: History   Social History  . Marital Status: Widowed    Spouse Name: N/A  . Number of Children: 6  . Years of Education: associate   Occupational History  .     Social  History Main Topics  . Smoking status: Never Smoker   . Smokeless tobacco: Never Used  . Alcohol Use: No  . Drug Use: No  . Sexual Activity: Not Currently    Birth Control/ Protection: Surgical     Comment: hyst   Other Topics Concern  . Not on file   Social History Narrative    FAMILY HISTORY: Family History  Problem Relation Age of Onset  . Heart disease Child 47    cardiac arrest  . Anuerysm Mother     deceased age 37, brain anuerysm  . Early death Mother 101  . Heart disease Father   . Hypertension Sister   . Obesity Brother   . Hypertension Sister   . Arthritis Sister   . Colon cancer Neg Hx    indicated that her mother is deceased. She indicated that her father is deceased. She indicated that all of her three sisters are alive. She indicated that her brother is alive. She indicated that her maternal grandmother is deceased. She indicated that her maternal grandfather is deceased. She indicated that her paternal grandmother is deceased. She indicated that her paternal grandfather is deceased. She indicated that all of her four daughters are alive. She indicated that only one of her two sons is alive. She indicated that her child is deceased.   ALLERGIES:  has No Known Allergies.  MEDICATIONS:  Current Outpatient Prescriptions  Medication Sig Dispense Refill  .  amLODipine (NORVASC) 10 MG tablet Take 10 mg by mouth daily.    . Multiple Vitamin (MULTIVITAMIN WITH MINERALS) TABS tablet Take 1 tablet by mouth daily.    . naproxen sodium (ANAPROX) 220 MG tablet Take 220 mg by mouth 2 (two) times daily as needed (for pain).    . nilotinib (TASIGNA) 150 MG capsule Take 2 capsules (300 mg total) by mouth every 12 (twelve) hours. 120 capsule 6   No current facility-administered medications for this visit.    Review of Systems  Constitutional: Positive for malaise/fatigue. Negative for fever, chills and weight loss.  HENT: Negative for congestion, hearing loss, nosebleeds,  sore throat and tinnitus.   Eyes: Negative for blurred vision, double vision, pain and discharge.  Respiratory: Negative for cough, hemoptysis, sputum production, shortness of breath and wheezing.   Cardiovascular: Negative for chest pain, palpitations, claudication, leg swelling and PND.  Gastrointestinal: Negative for heartburn, nausea, vomiting, abdominal pain, diarrhea, constipation, blood in stool and melena.  Genitourinary: Negative for dysuria, urgency, frequency and hematuria.  Musculoskeletal: Negative for myalgias, joint pain and falls.  Skin: Negative for itching and rash.  Neurological: Negative for dizziness, tingling, tremors, sensory change, speech change, focal weakness, seizures, loss of consciousness and headaches.  Endo/Heme/Allergies: Does not bruise/bleed easily.  Psychiatric/Behavioral: Negative for depression, suicidal ideas, memory loss and substance abuse. The patient is not nervous/anxious and does not have insomnia.    14 point ROS was done and is otherwise as detailed above or in HPI   PHYSICAL EXAMINATION: ECOG PERFORMANCE STATUS: 1 - Symptomatic but completely ambulatory  Filed Vitals:   07/20/14 1335  BP: 151/75  Pulse: 86  Temp: 98.9 F (37.2 C)  Resp: 16   Filed Weights   07/20/14 1335  Weight: 188 lb 11.2 oz (85.594 kg)     Physical Exam  Constitutional: She is oriented to person, place, and time and well-developed, well-nourished, and in no distress.  HENT:  Head: Normocephalic and atraumatic.  Nose: Nose normal.  Mouth/Throat: Oropharynx is clear and moist. No oropharyngeal exudate.  Eyes: Conjunctivae and EOM are normal. Pupils are equal, round, and reactive to light. Right eye exhibits no discharge. Left eye exhibits no discharge. No scleral icterus.  Neck: Normal range of motion. Neck supple. No tracheal deviation present. No thyromegaly present.  Cardiovascular: Normal rate, regular rhythm and normal heart sounds.  Exam reveals no gallop  and no friction rub.   No murmur heard. Pulmonary/Chest: Effort normal and breath sounds normal. She has no wheezes. She has no rales.  Abdominal: Soft. Bowel sounds are normal. She exhibits no distension and no mass. There is no tenderness. There is no rebound and no guarding.  Musculoskeletal: Normal range of motion. She exhibits no edema.  Lymphadenopathy:    She has no cervical adenopathy.  Neurological: She is alert and oriented to person, place, and time. She has normal reflexes. No cranial nerve deficit. Gait normal. Coordination normal.  Skin: Skin is warm and dry. No rash noted.  Psychiatric: Mood, memory, affect and judgment normal.  Nursing note and vitals reviewed.   LABORATORY DATA:  I have reviewed the data as listed  Bone Marrow, Aspirate,Biopsy, and Clot, right iliac - HYPERCELLULAR BONE MARROW WITH CHRONIC MYELOGENOUS LEUKEMIA. - SEE COMMENT. PERIPHERAL BLOOD: - CHRONIC MYELOGENOUS LEUKEMIA. 3% Blasts   Diagnosis Note Previous PCR analysis performed for bcr-abl1 is reportedly positive. Given the latter and the overall morphologic changes in the bone nmarrow, the findings are consistent with chronic myelogenous  leukemia. Clinical correlation is Recommended.  ASSESSMENT & PLAN:  CML originally diagnosed in 2010 Off therapy for several years BMBX on 07/14/2014 with chronic phase CML   We spent time today discussing CML and the importance of staying compliant with therapy. I reviewed the natural course of the disease without therapy. I advised her that there is some any assistance programs out there for co-pays that we can find her appropriate financial assistance.  Provided patient with information on CML and tasigna. Will give patient 2 week supply of tasigna. Discussed the risks and benefits of tasigna therapy. I emphasized that the benefits of this medication far outweigh the risks Will order an EKG today and in one week. (QTc 458 ms on EKG today) Left axis  deviation with normal sinus rhythm Follow up in 2 weeks She will have weekly labs moving forward.  I did review some of the reading information with the patient and her family. I asked her daughters to please return with her to her next appointment in 2 weeks. If they have questions please write them down and bring them to the follow-up visit.  All questions were answered. The patient knows to call the clinic with any problems, questions or concerns.  This note was electronically signed.    This document serves as a record of services personally performed by Ancil Linsey, MD. It was created on her behalf by Pearlie Oyster, a trained medical scribe. The creation of this record is based on the scribe's personal observations and the provider's statements to them. This document has been checked and approved by the attending provider.    I have reviewed the above documentation for accuracy and completeness, and I agree with the above.  Kelby Fam. Alyna Stensland MD

## 2014-07-20 NOTE — Patient Instructions (Addendum)
[facility] at [facility] Discharge Instructions  RECOMMENDATIONS MADE BY THE CONSULTANT AND ANY TEST RESULTS WILL BE SENT TO YOUR REFERRING PHYSICIAN.  Tasigna 150mg capsule. Take 2 capsules in the morning and 2 capsules in the evening (12 hours apart).   Gradually work up to 4 capsules a day.  Lab work weekly.   Return for follow up in 2 weeks.  Call for any new or worsening symptoms, questions, or concerns.      Thank you for choosing [facility] at [facility] to provide your oncology and hematology care.  To afford each patient quality time with our provider, please arrive at least 15 minutes before your scheduled appointment time.    You need to re-schedule your appointment should you arrive 10 or more minutes late.  We strive to give you quality time with our providers, and arriving late affects you and other patients whose appointments are after yours.  Also, if you no show three or more times for appointments you may be dismissed from the clinic at the providers discretion.     Again, thank you for choosing [facility].  Our hope is that these requests will decrease the amount of time that you wait before being seen by our physicians.       _____________________________________________________________  Should you have questions after your visit to [facility], please contact our office at [phone] between the hours of 8:30 a.m. and 4:30 p.m.  Voicemails left after 4:30 p.m. will not be returned until the following business day.  For prescription refill requests, have your pharmacy contact our office.   Nilotinib Oral Capsule What is this medicine? NILOTINIB (nil OT i nib) is a chemotherapy drug. It targets a specific protein within cancer cells and stops the cells from growing. This medicine is used to treat chronic myelogenous leukemia (CML). This medicine may be used for other purposes; ask your health  care provider or pharmacist if you have questions. COMMON BRAND NAME(S): Tasigna What should I tell my health care provider before I take this medicine? They need to know if you have any of these conditions: -heart disease -history of irregular heartbeat -history of pancreatitis -liver disease -low magnesium or potassium levels in the body -QT prolongation -total gastrectomy -an unusual or allergic reaction to nilotinib, lactose, gelatin, other medicines, foods, dyes, or preservatives -pregnant or trying to get pregnant -breast-feeding How should I use this medicine? Take this medicine by mouth with a glass of water. Follow the directions on the prescription label. Take this medicine on an empty stomach, at least 1 hour before or 2 hours after food. Do not take with food or with grapefruit juice. Take H2-blockers at least 10 hours before or 2 hours after this medicine. Avoid taking antacids within 2 hours of taking this medicine. Do not cut, crush, or chew this medicine. If you cannot swallow the capsules whole, you may open the capsule and sprinkle the contents of each capsule in 1 teaspoon of applesauce. Immediately swallow the mixture. Do not store for future use. Take your medicine at regular intervals. Do not take it more often than directed. Do not stop taking except on your doctor's advice. A special MedGuide will be given to you by the pharmacist with each prescription and refill. Be sure to read this information carefully each time. Talk to your pediatrician regarding the use of this medicine in children. Special care may be needed.   Overdosage: If you think you have taken too much of this medicine contact a poison control center or emergency room at once. NOTE: This medicine is only for you. Do not share this medicine with others. What if I miss a dose? If you miss a dose, do not make up the missing dose. Take your next dose as scheduled. Do not take double or extra doses. What may  interact with this medicine? Do not take this medicine with any of the following medications: -amoxapine -astemizole -bupivacaine -cisapride -clozapine -cyclobenzaprine -disopyramide -droperidol -flecainide -grapefruit or grapefruit juice -halofantrine -haloperidol -maprotiline -methadone -perphenazine -pimozide -quinidine -ranolazine -risperidone -sunitinib -tacrolimus -terfenadine -thioridazine -ziprasidone This medicine may also interact with the following medications: -antacids -antiviral medicines for HIV or AIDS -certain antibiotics like clarithromycin, erythromycin, telithromycin, troleandomycin -dexamethasone -medicines for blood pressure, heart disease, irregular heart beat -medicines for depression, anxiety, or psychotic disturbances -medicines for fungal infections like ketoconazole, itraconazole, voriconazole, fluconazole -medicines for seizures like carbamazepine, phenobarbital, phenytoin -medicines for stomach problems like cimetidine, famotidine, omeprazole, lansoprazole -medicines for sleep -mifepristone -propoxyphene -rifabutin -rifampin -rifapentine -St. John's Wort -tamoxifen -warfarin -zafirlukast This list may not describe all possible interactions. Give your health care provider a list of all the medicines, herbs, non-prescription drugs, or dietary supplements you use. Also tell them if you smoke, drink alcohol, or use illegal drugs. Some items may interact with your medicine. What should I watch for while using this medicine? Visit your doctor for checks on your progress. You will need to have regular blood tests while on this medicine. Report any new symptoms promptly. Call your doctor or health care professional for advice if you get a fever, chills or sore throat, or other symptoms of a cold or flu. Do not treat yourself. This drug decreases your body's ability to fight infections. Try to avoid being around people who are sick. This medicine  may increase your risk to bruise or bleed. Call your doctor or health care professional if you notice any unusual bleeding. Be careful brushing and flossing your teeth or using a toothpick because you may get an infection or bleed more easily. If you have any dental work done, tell your dentist you are receiving this medicine. Avoid taking products that contain aspirin, acetaminophen, ibuprofen, naproxen, or ketoprofen unless instructed by your doctor. These medicines may hide a fever. Do not become pregnant while taking this medicine. Women should inform their doctor if they wish to become pregnant or think they might be pregnant. There is a potential for serious side effects to an unborn child. Talk to your health care professional or pharmacist for more information. Do not breast-feed an infant while taking this medicine. This medicine may affect blood sugar levels. If you have diabetes, check with your doctor or health care professional before you change your diet or the dose of your diabetic medicine. This drug may make you feel generally unwell. This is not uncommon, as chemotherapy can affect healthy cells as well as cancer cells. Report any side effects. Continue your course of treatment even though you feel ill unless your doctor tells you to stop. What side effects may I notice from receiving this medicine? Side effects that you should report to your doctor or health care professional as soon as possible: -allergic reactions like skin rash, itching or hives, swelling of the face, lips, or tongue -breathing problems -chest pain or palpitations -confusion, trouble speaking or understanding -dizziness or fainting -fast, irregular heartbeat -fever or chills, sore throat -increased hunger or  thirst -increased urination -light-colored stools -pain, swelling, warmth in the leg -signs and symptoms of bleeding such as bloody or black, tarry stools; red or dark-brown urine; spitting up blood or  brown material that looks like coffee grounds; red spots on the skin; unusual bruising or bleeding from the eye, gums, or nose -sudden numbness or weakness of the face, arm or leg -swelling of the ankles, feet, hands -trouble walking, dizziness, loss of balance or coordination -unusually weak or tired -weight gain -yellowing of the eyes or skin Side effects that usually do not require medical attention (report to your doctor or health care professional if they continue or are bothersome): -constipation -diarrhea -headache -loss of appetite -nausea, vomiting -muscle aches -stomach pain -trouble sleeping This list may not describe all possible side effects. Call your doctor for medical advice about side effects. You may report side effects to FDA at 1-800-FDA-1088. Where should I keep my medicine? Keep out of the reach of children. Store at room temperature between 15 and 30 degrees C (59 and 86 degrees F). Throw away any unused medicine after the expiration date. NOTE: This sheet is a summary. It may not cover all possible information. If you have questions about this medicine, talk to your doctor, pharmacist, or health care provider.  2015, Elsevier/Gold Standard. (2013-04-13 09:11:58) Chronic Myelogenous Leukemia Chronic myelogenous leukemia (CML) is a rare form of cancer of the blood cells. It is called chronic because it develops more slowly than other forms of leukemia, which are considered acute. No one knows the exact cause of CML. RISK FACTORS  Having a chromosome abnormality called [place] chromosome. Chromosomes contain your genes, which determine your physical traits, such as eye color or hair color.   Having had radiation treatment for some other condition or form of cancer.   Being an adult. CML usually occurs later in life and is rare in children. SYMPTOMS  At first, there may be no symptoms. After a while, some symptoms may occur, such as:   Feeling more tired  than usual.  Feeling tired after rest.   Unplanned weight loss.   Heavy sweating at night.   Fever.   Paleness.   A feeling of fullness in the upper left part of the abdomen.   Easy bruising or bleeding.   More frequent infections.  DIAGNOSIS  Your health care provider may perform the following exams or tests to help diagnose CML:   A physical exam to check for an enlarged spleen, liver, or lymph nodes.   Blood (CBC with differential) tests.  Bone marrow tests.  Tests to check for the presence of Philadelphia chromosome as well as other abnormalities. These may include cytogenetic analysis, fluorescence in situ hybridization (FISH), and a reverse transcription polymerase chain reaction (RT-PCR) test. TREATMENT  There are different types of treatment used for this condition, including:   Targeted drugs. These medicines interfere with chemicals the leukemia cells need in order to grow and multiply.   Chemotherapy drugs. These medicines kill cells that are multiplying quickly, such as leukemia cells.   Biological therapy. This treatment boosts the ability of your immune system to fight the leukemia cells.   Bone marrow or peripheral blood stem cell transplant. This treatment allows you to receive very high doses of chemotherapy or radiation, or both. These high doses kill the cancer cells, but they also destroy the bone marrow. After treatment is complete, you are given donor bone marrow or stem cells, which will replace the bone marrow.  Surgery to remove the spleen. HOME CARE INSTRUCTIONS   Take medicines only as directed by your health care provider.  Although some of your treatments might affect your appetite, try to eat regular, healthy meals.   If you develop any side effects, tell your health care provider. He or she may have recommendations of things you can do to improve symptoms.   Consider learning some ways to cope with the stress of having a  chronic illness, such as by doing yoga or meditation or by participating in a support group.  SEEK MEDICAL CARE IF:  You feel light-headed.   You notice pain, swelling, or redness anywhere in your legs.   You have a fever.  You have uncontrollable bleeding, such as a nosebleed that will not stop.   You are unable to stop vomiting.   You cannot keep liquids down.  SEEK IMMEDIATE MEDICAL CARE IF:  You develop chest pains.   You have trouble breathing or feel short of breath.   You faint. MAKE SURE YOU:  Understand these instructions.  Will watch your condition.  Will get help right away if you are not doing well or get worse. Document Released: 02/08/2008 Document Revised: 07/12/2013 Document Reviewed: 08/20/2012 ExitCare Patient Information 2015 [employer]. This information is not intended to replace advice given to you by your health care provider. Make sure you discuss any questions you have with your health care provider.  

## 2014-07-21 ENCOUNTER — Telehealth (HOSPITAL_COMMUNITY): Payer: Self-pay | Admitting: *Deleted

## 2014-07-21 LAB — CHROMOSOME ANALYSIS, BONE MARROW

## 2014-07-21 NOTE — Telephone Encounter (Signed)
Patient took Tasigna 1 tablet this am and will take Tasigna 1 tablet tonight. Patient is doing ok.

## 2014-07-27 ENCOUNTER — Encounter (HOSPITAL_BASED_OUTPATIENT_CLINIC_OR_DEPARTMENT_OTHER): Payer: Commercial Managed Care - HMO

## 2014-07-27 DIAGNOSIS — Z79899 Other long term (current) drug therapy: Secondary | ICD-10-CM

## 2014-07-27 DIAGNOSIS — C9210 Chronic myeloid leukemia, BCR/ABL-positive, not having achieved remission: Secondary | ICD-10-CM | POA: Diagnosis not present

## 2014-07-27 DIAGNOSIS — D72829 Elevated white blood cell count, unspecified: Secondary | ICD-10-CM | POA: Diagnosis not present

## 2014-07-27 LAB — COMPREHENSIVE METABOLIC PANEL
ALT: 25 U/L (ref 14–54)
ANION GAP: 8 (ref 5–15)
AST: 22 U/L (ref 15–41)
Albumin: 4.2 g/dL (ref 3.5–5.0)
Alkaline Phosphatase: 87 U/L (ref 38–126)
BUN: 11 mg/dL (ref 6–20)
CHLORIDE: 102 mmol/L (ref 101–111)
CO2: 26 mmol/L (ref 22–32)
Calcium: 9.4 mg/dL (ref 8.9–10.3)
Creatinine, Ser: 0.67 mg/dL (ref 0.44–1.00)
Glucose, Bld: 431 mg/dL — ABNORMAL HIGH (ref 65–99)
POTASSIUM: 3.8 mmol/L (ref 3.5–5.1)
Sodium: 136 mmol/L (ref 135–145)
Total Bilirubin: 1.2 mg/dL (ref 0.3–1.2)
Total Protein: 7.6 g/dL (ref 6.5–8.1)

## 2014-07-27 LAB — MAGNESIUM: MAGNESIUM: 1.8 mg/dL (ref 1.7–2.4)

## 2014-07-27 NOTE — Progress Notes (Signed)
Labs drawn

## 2014-07-28 ENCOUNTER — Telehealth (HOSPITAL_COMMUNITY): Payer: Self-pay | Admitting: *Deleted

## 2014-07-28 NOTE — Telephone Encounter (Signed)
Pt's copay is > $3000. Patient to contact [employer] to see what services are available for copay.

## 2014-08-04 ENCOUNTER — Other Ambulatory Visit: Payer: Self-pay

## 2014-08-04 ENCOUNTER — Telehealth (HOSPITAL_COMMUNITY): Payer: Self-pay | Admitting: Hematology & Oncology

## 2014-08-04 ENCOUNTER — Encounter (HOSPITAL_BASED_OUTPATIENT_CLINIC_OR_DEPARTMENT_OTHER): Payer: Commercial Managed Care - HMO

## 2014-08-04 ENCOUNTER — Encounter (HOSPITAL_BASED_OUTPATIENT_CLINIC_OR_DEPARTMENT_OTHER): Payer: Commercial Managed Care - HMO | Admitting: Oncology

## 2014-08-04 VITALS — BP 127/70 | HR 81 | Temp 98.5°F | Resp 16 | Wt 186.8 lb

## 2014-08-04 DIAGNOSIS — Z79899 Other long term (current) drug therapy: Secondary | ICD-10-CM

## 2014-08-04 DIAGNOSIS — C9210 Chronic myeloid leukemia, BCR/ABL-positive, not having achieved remission: Secondary | ICD-10-CM

## 2014-08-04 DIAGNOSIS — D72829 Elevated white blood cell count, unspecified: Secondary | ICD-10-CM | POA: Diagnosis not present

## 2014-08-04 LAB — COMPREHENSIVE METABOLIC PANEL
ALT: 30 U/L (ref 14–54)
AST: 28 U/L (ref 15–41)
Albumin: 4.3 g/dL (ref 3.5–5.0)
Alkaline Phosphatase: 93 U/L (ref 38–126)
Anion gap: 11 (ref 5–15)
BUN: 13 mg/dL (ref 6–20)
CALCIUM: 9.2 mg/dL (ref 8.9–10.3)
CO2: 25 mmol/L (ref 22–32)
CREATININE: 0.61 mg/dL (ref 0.44–1.00)
Chloride: 102 mmol/L (ref 101–111)
GFR calc Af Amer: >60 mL/min (ref >60)
GFR calc non Af Amer: >60 mL/min (ref >60)
GLUCOSE: 229 mg/dL — AB (ref 65–99)
POTASSIUM: 3.9 mmol/L (ref 3.5–5.1)
Sodium: 138 mmol/L (ref 135–145)
Total Bilirubin: 0.9 mg/dL (ref 0.3–1.2)
Total Protein: 7.6 g/dL (ref 6.5–8.1)

## 2014-08-04 LAB — MAGNESIUM: Magnesium: 2 mg/dL (ref 1.7–2.4)

## 2014-08-04 NOTE — Assessment & Plan Note (Addendum)
CML having been followed by Dr. Tressie Stalker in past while taking Gleevac.  According to records, she failed Gleevac therapy after several years at which time she was switched to a newer TKI, but could not afford the co-pay.  Therefore, she stopped taking the medication on her own accord.  Oncology history is updated.  EKG today. QTc is 466 ms  Labs weekly as orderd.  She reports that she has been out of her Tasigna x 3 days.  She was previously given samples for Tasigna, approximately a 2 week supply.  I have discussed with Lendell Caprice, financial counselor, and she reports that the patient cannot afford the co-pay of 26% of $10,000+.  Levada Dy reports that she is waiting for decision regarding free medication but we will not know the answer for 2-3 business days.  Return in 2 weeks for follow-up.

## 2014-08-04 NOTE — Telephone Encounter (Signed)
Pc to [employer] spk with Lavella Lemons. We reviewed benefits and determined that pt will need copay assist. She has to pay 26% of $10,000 plus for Tasigna.  Turn around time is 2-3 b days  [facility] [phone]

## 2014-08-04 NOTE — Patient Instructions (Signed)
[place] at [facility] Discharge Instructions  RECOMMENDATIONS MADE BY THE CONSULTANT AND ANY TEST RESULTS WILL BE SENT TO YOUR REFERRING PHYSICIAN.  Labs again in 1 week and 2 weeks. MD appointment again in 2 weeks. Return as scheduled.  Thank you for choosing [place] at [facility] to provide your oncology and hematology care.  To afford each patient quality time with our provider, please arrive at least 15 minutes before your scheduled appointment time.    You need to re-schedule your appointment should you arrive 10 or more minutes late.  We strive to give you quality time with our providers, and arriving late affects you and other patients whose appointments are after yours.  Also, if you no show three or more times for appointments you may be dismissed from the clinic at the providers discretion.     Again, thank you for choosing [facility].  Our hope is that these requests will decrease the amount of time that you wait before being seen by our physicians.       _____________________________________________________________  Should you have questions after your visit to [facility], please contact our office at [phone] between the hours of 8:30 a.m. and 4:30 p.m.  Voicemails left after 4:30 p.m. will not be returned until the following business day.  For prescription refill requests, have your pharmacy contact our office.

## 2014-08-04 NOTE — Progress Notes (Signed)
[name], MD [place] [address]  Chronic myelogenous leukemia (CML), BCR-ABL1-positive - Plan: EKG 12-Lead, EKG 12-Lead  CURRENT THERAPY: Tasigna 150 mg every 12 hours  INTERVAL HISTORY: April Page 69 y.o. female returns for followup of CML having been followed by Dr. Tressie Stalker in past while taking Gleevac.  According to records, she failed Gleevac therapy after several years at which time she was switched to a newer TKI, but could not afford the co-pay.  Therefore, she stopped taking the medication on her own accord.    Chronic myelogenous leukemia (CML), BCR-ABL1-positive    Initial Diagnosis Chronic myelogenous leukemia (CML), BCR-ABL1-positive   07/14/2014 Bone Marrow Biopsy Bone Marrow, Aspirate,Biopsy, and Clot, right iliac - HYPERCELLULAR BONE MARROW WITH CHRONIC MYELOGENOUS LEUKEMIA. - SEE COMMENT. PERIPHERAL BLOOD: - CHRONIC MYELOGENOUS LEUKEMIA. Diagnosis Note Previous PCR analysis performed for bcr-abl1 is report   07/21/2014 -  Chemotherapy Tasigna 150 mg BID   I personally reviewed and went over laboratory results with the patient.  The results are noted within this dictation.  I have reviewed her EKG from today and compared to her previous EKG.  QTc is stable.    She reports that she has been out of her Tasigna x 3 days.  She was previously given samples for Tasigna, approximately a 2 week supply.  I have discussed with Lendell Caprice, financial counselor, and she reports that the patient cannot afford the co-pay of 26% of $10,000+.  Levada Dy reports that she is waiting for decision regarding free medication but we will not know the answer for 2-3 business days.  She is concerned about this break in therapy.  Unfortunately, there is nothing we can do to change this.  Past Medical History  Diagnosis Date  . GERD (gastroesophageal reflux disease)   . Hypertension   . Obesity   . Hyperlipidemia   . Pessary maintenance 07/01/2013  .  H/O: CML (chronic myeloid leukemia)   . Cancer   . Blood dyscrasia   . Arthritis     stiff knees    has Pessary maintenance; Chronic diarrhea; Leukocytosis; Diabetes type 2, uncontrolled; Atypical chest pain; GERD (gastroesophageal reflux disease); and Chronic myelogenous leukemia (CML), BCR-ABL1-positive on her problem list.     has No Known Allergies.  Current Outpatient Prescriptions on File Prior to Visit  Medication Sig Dispense Refill  . amLODipine (NORVASC) 10 MG tablet Take 10 mg by mouth daily.    . Multiple Vitamin (MULTIVITAMIN WITH MINERALS) TABS tablet Take 1 tablet by mouth daily.    . naproxen sodium (ANAPROX) 220 MG tablet Take 220 mg by mouth 2 (two) times daily as needed (for pain).    . nilotinib (TASIGNA) 150 MG capsule Take 2 capsules (300 mg total) by mouth every 12 (twelve) hours. 120 capsule 6  . ondansetron (ZOFRAN) 8 MG tablet Take 1 tablet (8 mg total) by mouth every 8 (eight) hours as needed for nausea or vomiting. (Patient not taking: Reported on 08/04/2014) 30 tablet 1   No current facility-administered medications on file prior to visit.    Past Surgical History  Procedure Laterality Date  . Cholecystectomy    . Abdominal hysterectomy    . Hernia repair    . Oophorectomy    . Esophagogastroduodenoscopy  06/09/2002    NID:POEUMP upper gastrointestinal tract s/p  54-French Maloney dilator  . Colonoscopy  06/09/2002    NTI:RWERXVQM hemorrhoids; otherwise normal rectum, colon   . Cystocele/rectocele  repair  2009  . Colonoscopy N/A 08/25/2013    EFE:OFHQRFX diverticulosis. Single colonic polyp-removed  s/p segmental biopsy and stool sample. random colon bx negative. +benign leiomyoma.    Denies any headaches, dizziness, double vision, fevers, chills, night sweats, nausea, vomiting, diarrhea, constipation, chest pain, heart palpitations, shortness of breath, blood in stool, black tarry stool, urinary pain, urinary burning, urinary frequency, hematuria.     PHYSICAL EXAMINATION  ECOG PERFORMANCE STATUS: 0 - Asymptomatic  Filed Vitals:   08/04/14 1205  BP: 127/70  Pulse: 81  Temp: 98.5 F (36.9 C)  Resp: 16    GENERAL:alert, no distress, well nourished, well developed, comfortable, cooperative, smiling and accompanied by her daughter, [name] SKIN: skin color, texture, turgor are normal, no rashes or significant lesions HEAD: Normocephalic, No masses, lesions, tenderness or abnormalities EYES: normal, PERRLA, EOMI, Conjunctiva are pink and non-injected EARS: External ears normal OROPHARYNX:lips, buccal mucosa, and tongue normal and mucous membranes are moist  NECK: supple, trachea midline LYMPH:  not examined BREAST:not examined LUNGS: clear to auscultation  HEART: regular rate & rhythm ABDOMEN:abdomen soft and normal bowel sounds BACK: Back symmetric, no curvature. EXTREMITIES:less then 2 second capillary refill, no skin discoloration, no cyanosis  NEURO: alert & oriented x 3 with fluent speech, no focal motor/sensory deficits, gait normal   LABORATORY DATA: CBC    Component Value Date/Time   WBC 69.4* 07/20/2014 1457   RBC 4.47 07/20/2014 1457   HGB 13.5 07/20/2014 1457   HCT 41.3 07/20/2014 1457   PLT 315 07/20/2014 1457   MCV 92.4 07/20/2014 1457   MCH 30.2 07/20/2014 1457   MCHC 32.7 07/20/2014 1457   RDW 17.1* 07/20/2014 1457   LYMPHSABS 5.0* 07/20/2014 1457   MONOABS 2.7* 07/20/2014 1457   EOSABS 0.4 07/20/2014 1457   BASOSABS 2.9* 07/20/2014 1457      Chemistry      Component Value Date/Time   NA 138 08/04/2014 1204   K 3.9 08/04/2014 1204   CL 102 08/04/2014 1204   CO2 25 08/04/2014 1204   BUN 13 08/04/2014 1204   CREATININE 0.61 08/04/2014 1204      Component Value Date/Time   CALCIUM 9.2 08/04/2014 1204   ALKPHOS 93 08/04/2014 1204   AST 28 08/04/2014 1204   ALT 30 08/04/2014 1204   BILITOT 0.9 08/04/2014 1204       RADIOGRAPHIC STUDIES:  Ct Biopsy  07/14/2014   CLINICAL DATA:  CML  EXAM:  CT GUIDED RIGHT ILIAC BONE MARROW ASPIRATION AND CORE BIOPSY  Date:  5/5/20165/07/2014 10:50 am  Radiologist:  M. Daryll Brod, MD  Guidance:  CT  FLUOROSCOPY TIME:  None.  MEDICATIONS AND MEDICAL HISTORY: 4 mg Versed, 100 mcg fentanyl  ANESTHESIA/SEDATION: 15 minutes  CONTRAST:  None.  COMPLICATIONS: None  PROCEDURE: Informed consent was obtained from the patient following explanation of the procedure, risks, benefits and alternatives. The patient understands, agrees and consents for the procedure. All questions were addressed. A time out was performed.  The patient was positioned prone and noncontrast localization CT was performed of the pelvis to demonstrate the iliac marrow spaces.  Maximal barrier sterile technique utilized including caps, mask, sterile gowns, sterile gloves, large sterile drape, hand hygiene, and betadine prep.  Under sterile conditions and local anesthesia, an 11 gauge coaxial bone biopsy needle was advanced into the right iliac marrow space. Needle position was confirmed with CT imaging. Initially, bone marrow aspiration was performed. Next, the 11 gauge outer cannula was utilized to obtain a  right iliac bone marrow core biopsy. Needle was removed. Hemostasis was obtained with compression. The patient tolerated the procedure well. Samples were prepared with the cytotechnologist. No immediate complications.  IMPRESSION: CT guided right iliac bone marrow aspiration and core biopsy.   Electronically Signed   By: Jerilynn Mages.  Shick M.D.   On: 07/14/2014 11:17      ASSESSMENT AND PLAN:  Chronic myelogenous leukemia (CML), BCR-ABL1-positive CML having been followed by Dr. Tressie Stalker in past while taking Gleevac.  According to records, she failed Gleevac therapy after several years at which time she was switched to a newer TKI, but could not afford the co-pay.  Therefore, she stopped taking the medication on her own accord.  Oncology history is updated.  EKG today. QTc is 466 ms  Labs weekly as  orderd.  She reports that she has been out of her Tasigna x 3 days.  She was previously given samples for Tasigna, approximately a 2 week supply.  I have discussed with Lendell Caprice, financial counselor, and she reports that the patient cannot afford the co-pay of 26% of $10,000+.  Levada Dy reports that she is waiting for decision regarding free medication but we will not know the answer for 2-3 business days.  Return in 2 weeks for follow-up.   THERAPY PLAN:  We will get Tasigna to the patient as soon as possible after hearing the results from our recruitment of financial assistance from [employer]  All questions were answered. The patient knows to call the clinic with any problems, questions or concerns. We can certainly see the patient much sooner if necessary.  Patient and plan discussed with Dr. Ancil Linsey and she is in agreement with the aforementioned.   This note is electronically signed by: Doy Mince 08/04/2014 1:33 PM

## 2014-08-04 NOTE — Progress Notes (Signed)
LABS DRAWN

## 2014-08-09 ENCOUNTER — Ambulatory Visit: Payer: Commercial Managed Care - HMO | Admitting: Adult Health

## 2014-08-09 ENCOUNTER — Other Ambulatory Visit (HOSPITAL_COMMUNITY): Payer: Self-pay

## 2014-08-11 ENCOUNTER — Encounter (HOSPITAL_COMMUNITY): Payer: Commercial Managed Care - HMO | Attending: Hematology & Oncology

## 2014-08-11 DIAGNOSIS — D72829 Elevated white blood cell count, unspecified: Secondary | ICD-10-CM | POA: Insufficient documentation

## 2014-08-11 DIAGNOSIS — C9210 Chronic myeloid leukemia, BCR/ABL-positive, not having achieved remission: Secondary | ICD-10-CM | POA: Diagnosis not present

## 2014-08-11 DIAGNOSIS — Z79899 Other long term (current) drug therapy: Secondary | ICD-10-CM

## 2014-08-11 LAB — CBC WITH DIFFERENTIAL/PLATELET
BASOS PCT: 3 % — AB (ref 0–1)
Basophils Absolute: 0.1 10*3/uL (ref 0.0–0.1)
EOS PCT: 3 % (ref 0–5)
Eosinophils Absolute: 0.1 10*3/uL (ref 0.0–0.7)
HEMATOCRIT: 37.7 % (ref 36.0–46.0)
Hemoglobin: 12.2 g/dL (ref 12.0–15.0)
LYMPHS ABS: 1.5 10*3/uL (ref 0.7–4.0)
Lymphocytes Relative: 35 % (ref 12–46)
MCH: 29.5 pg (ref 26.0–34.0)
MCHC: 32.4 g/dL (ref 30.0–36.0)
MCV: 91.3 fL (ref 78.0–100.0)
MONO ABS: 0.6 10*3/uL (ref 0.1–1.0)
MONOS PCT: 12 % (ref 3–12)
NEUTROS PCT: 47 % (ref 43–77)
Neutro Abs: 2.1 10*3/uL (ref 1.7–7.7)
Platelets: 132 10*3/uL — ABNORMAL LOW (ref 150–400)
RBC: 4.13 MIL/uL (ref 3.87–5.11)
RDW: 16.2 % — AB (ref 11.5–15.5)
WBC: 4.4 10*3/uL (ref 4.0–10.5)

## 2014-08-11 LAB — COMPREHENSIVE METABOLIC PANEL
ALBUMIN: 4.1 g/dL (ref 3.5–5.0)
ALT: 25 U/L (ref 14–54)
AST: 22 U/L (ref 15–41)
Alkaline Phosphatase: 83 U/L (ref 38–126)
Anion gap: 10 (ref 5–15)
BILIRUBIN TOTAL: 1 mg/dL (ref 0.3–1.2)
BUN: 9 mg/dL (ref 6–20)
CALCIUM: 9.3 mg/dL (ref 8.9–10.3)
CHLORIDE: 103 mmol/L (ref 101–111)
CO2: 28 mmol/L (ref 22–32)
Creatinine, Ser: 0.54 mg/dL (ref 0.44–1.00)
GFR calc Af Amer: >60 mL/min (ref >60)
GFR calc non Af Amer: >60 mL/min (ref >60)
Glucose, Bld: 207 mg/dL — ABNORMAL HIGH (ref 65–99)
POTASSIUM: 3.5 mmol/L (ref 3.5–5.1)
Sodium: 141 mmol/L (ref 135–145)
TOTAL PROTEIN: 7.3 g/dL (ref 6.5–8.1)

## 2014-08-11 LAB — MAGNESIUM: Magnesium: 1.9 mg/dL (ref 1.7–2.4)

## 2014-08-12 NOTE — Progress Notes (Signed)
LABS DRAWN

## 2014-08-18 ENCOUNTER — Encounter (HOSPITAL_COMMUNITY): Payer: Commercial Managed Care - HMO

## 2014-08-18 ENCOUNTER — Encounter (HOSPITAL_BASED_OUTPATIENT_CLINIC_OR_DEPARTMENT_OTHER): Payer: Commercial Managed Care - HMO | Admitting: Oncology

## 2014-08-18 VITALS — BP 147/87 | HR 74 | Temp 98.7°F | Resp 16 | Wt 185.0 lb

## 2014-08-18 DIAGNOSIS — C9210 Chronic myeloid leukemia, BCR/ABL-positive, not having achieved remission: Secondary | ICD-10-CM | POA: Diagnosis not present

## 2014-08-18 DIAGNOSIS — D72829 Elevated white blood cell count, unspecified: Secondary | ICD-10-CM | POA: Diagnosis not present

## 2014-08-18 DIAGNOSIS — Z79899 Other long term (current) drug therapy: Secondary | ICD-10-CM

## 2014-08-18 LAB — CBC WITH DIFFERENTIAL/PLATELET
BASOS ABS: 0.3 10*3/uL — AB (ref 0.0–0.1)
Basophils Relative: 5 % — ABNORMAL HIGH (ref 0–1)
EOS PCT: 1 % (ref 0–5)
Eosinophils Absolute: 0.1 10*3/uL (ref 0.0–0.7)
HCT: 39.4 % (ref 36.0–46.0)
HEMOGLOBIN: 12.7 g/dL (ref 12.0–15.0)
LYMPHS ABS: 1.7 10*3/uL (ref 0.7–4.0)
Lymphocytes Relative: 33 % (ref 12–46)
MCH: 29.3 pg (ref 26.0–34.0)
MCHC: 32.2 g/dL (ref 30.0–36.0)
MCV: 91 fL (ref 78.0–100.0)
MONO ABS: 0.6 10*3/uL (ref 0.1–1.0)
Monocytes Relative: 11 % (ref 3–12)
Neutro Abs: 2.6 10*3/uL (ref 1.7–7.7)
Neutrophils Relative %: 50 % (ref 43–77)
Platelets: 166 10*3/uL (ref 150–400)
RBC: 4.33 MIL/uL (ref 3.87–5.11)
RDW: 15.4 % (ref 11.5–15.5)
WBC: 5.1 10*3/uL (ref 4.0–10.5)

## 2014-08-18 LAB — COMPREHENSIVE METABOLIC PANEL
ALK PHOS: 84 U/L (ref 38–126)
ALT: 26 U/L (ref 14–54)
AST: 27 U/L (ref 15–41)
Albumin: 4.5 g/dL (ref 3.5–5.0)
Anion gap: 13 (ref 5–15)
BUN: 10 mg/dL (ref 6–20)
CALCIUM: 9.3 mg/dL (ref 8.9–10.3)
CO2: 25 mmol/L (ref 22–32)
Chloride: 100 mmol/L — ABNORMAL LOW (ref 101–111)
Creatinine, Ser: 0.71 mg/dL (ref 0.44–1.00)
GFR calc Af Amer: >60 mL/min (ref >60)
GLUCOSE: 317 mg/dL — AB (ref 65–99)
Potassium: 3.3 mmol/L — ABNORMAL LOW (ref 3.5–5.1)
Sodium: 138 mmol/L (ref 135–145)
TOTAL PROTEIN: 7.8 g/dL (ref 6.5–8.1)
Total Bilirubin: 2.7 mg/dL — ABNORMAL HIGH (ref 0.3–1.2)

## 2014-08-18 LAB — MAGNESIUM: Magnesium: 1.8 mg/dL (ref 1.7–2.4)

## 2014-08-18 NOTE — Progress Notes (Signed)
April Page presented for labwork. Labs per MD order drawn via Peripheral Line 25 gauge needle inserted in [place].  Procedure without incident.  Patient tolerated procedure well.

## 2014-08-18 NOTE — Assessment & Plan Note (Signed)
She has started Tasigna on 08/15/2014 again after a short break in therapy due to free sample running out and the time it took her to get medication filled.    She is tolerating well.  Labs today as ordered.  Weekly labs x 3.  Return in 3 weeks for follow-up.

## 2014-08-18 NOTE — Progress Notes (Signed)
[name], MD [place] [address]  Chronic myelogenous leukemia (CML), BCR-ABL1-positive  CURRENT THERAPY: Tasigna 150 mg BID every 12 hours  INTERVAL HISTORY: April Page 69 y.o. female returns for followup of CML having been followed by Dr. Tressie Stalker in past while taking Gleevac.  According to records, she failed Gleevac therapy after several years at which time she was switched to a newer TKI, but could not afford the co-pay.  Therefore, she stopped taking the medication on her own accord.    Chronic myelogenous leukemia (CML), BCR-ABL1-positive    Initial Diagnosis Chronic myelogenous leukemia (CML), BCR-ABL1-positive   07/14/2014 Bone Marrow Biopsy Bone Marrow, Aspirate,Biopsy, and Clot, right iliac - HYPERCELLULAR BONE MARROW WITH CHRONIC MYELOGENOUS LEUKEMIA. - SEE COMMENT. PERIPHERAL BLOOD: - CHRONIC MYELOGENOUS LEUKEMIA. Diagnosis Note Previous PCR analysis performed for bcr-abl1 is report   07/21/2014 -  Chemotherapy Tasigna 150 mg BID   I personally reviewed and went over laboratory results with the patient.  The results are noted within this dictation.  She reports that she just started her Tasigna on 08/15/2014 after a lapse in treatment due to free sample running out and time to her next refill.  She is tolerating well without any complaints.    Past Medical History  Diagnosis Date  . GERD (gastroesophageal reflux disease)   . Hypertension   . Obesity   . Hyperlipidemia   . Pessary maintenance 07/01/2013  . H/O: CML (chronic myeloid leukemia)   . Cancer   . Blood dyscrasia   . Arthritis     stiff knees    has Pessary maintenance; Chronic diarrhea; Leukocytosis; Diabetes type 2, uncontrolled; Atypical chest pain; GERD (gastroesophageal reflux disease); and Chronic myelogenous leukemia (CML), BCR-ABL1-positive on her problem list.     has No Known Allergies.  Current Outpatient Prescriptions on File Prior to Visit    Medication Sig Dispense Refill  . amLODipine (NORVASC) 10 MG tablet Take 10 mg by mouth daily.    . Multiple Vitamin (MULTIVITAMIN WITH MINERALS) TABS tablet Take 1 tablet by mouth daily.    . naproxen sodium (ANAPROX) 220 MG tablet Take 220 mg by mouth 2 (two) times daily as needed (for pain).    . nilotinib (TASIGNA) 150 MG capsule Take 2 capsules (300 mg total) by mouth every 12 (twelve) hours. 120 capsule 6  . ondansetron (ZOFRAN) 8 MG tablet Take 1 tablet (8 mg total) by mouth every 8 (eight) hours as needed for nausea or vomiting. 30 tablet 1   No current facility-administered medications on file prior to visit.    Past Surgical History  Procedure Laterality Date  . Cholecystectomy    . Abdominal hysterectomy    . Hernia repair    . Oophorectomy    . Esophagogastroduodenoscopy  06/09/2002    HUD:JSHFWY upper gastrointestinal tract s/p  54-French Maloney dilator  . Colonoscopy  06/09/2002    OVZ:CHYIFOYD hemorrhoids; otherwise normal rectum, colon   . Cystocele/rectocele repair  2009  . Colonoscopy N/A 08/25/2013    XAJ:OINOMVE diverticulosis. Single colonic polyp-removed  s/p segmental biopsy and stool sample. random colon bx negative. +benign leiomyoma.    Denies any headaches, dizziness, double vision, fevers, chills, night sweats, nausea, vomiting, diarrhea, constipation, chest pain, heart palpitations, shortness of breath, blood in stool, black tarry stool, urinary pain, urinary burning, urinary frequency, hematuria.   PHYSICAL EXAMINATION  ECOG PERFORMANCE STATUS: 0 - Asymptomatic  Filed Vitals:   08/18/14 1505  BP: 147/87  Pulse: 74  Temp: 98.7 F (37.1 C)  Resp: 16    GENERAL:alert, no distress, well nourished, well developed, comfortable, cooperative, smiling and accompanied by her daughter. SKIN: skin color, texture, turgor are normal, no rashes or significant lesions HEAD: Normocephalic, No masses, lesions, tenderness or abnormalities EYES: normal, PERRLA,  EOMI, Conjunctiva are pink and non-injected EARS: External ears normal OROPHARYNX:lips, buccal mucosa, and tongue normal and mucous membranes are moist  NECK: supple, trachea midline LYMPH:  not examined BREAST:not examined LUNGS: clear to auscultation  HEART: regular rate & rhythm ABDOMEN:abdomen soft and normal bowel sounds BACK: Back symmetric, no curvature. EXTREMITIES:less then 2 second capillary refill, no skin discoloration, no cyanosis  NEURO: alert & oriented x 3 with fluent speech, no focal motor/sensory deficits, gait normal   LABORATORY DATA: CBC    Component Value Date/Time   WBC 5.1 08/18/2014 1450   RBC 4.33 08/18/2014 1450   HGB 12.7 08/18/2014 1450   HCT 39.4 08/18/2014 1450   PLT 166 08/18/2014 1450   MCV 91.0 08/18/2014 1450   MCH 29.3 08/18/2014 1450   MCHC 32.2 08/18/2014 1450   RDW 15.4 08/18/2014 1450   LYMPHSABS 1.7 08/18/2014 1450   MONOABS 0.6 08/18/2014 1450   EOSABS 0.1 08/18/2014 1450   BASOSABS 0.3* 08/18/2014 1450      Chemistry      Component Value Date/Time   NA 141 08/11/2014 1114   K 3.5 08/11/2014 1114   CL 103 08/11/2014 1114   CO2 28 08/11/2014 1114   BUN 9 08/11/2014 1114   CREATININE 0.54 08/11/2014 1114      Component Value Date/Time   CALCIUM 9.3 08/11/2014 1114   ALKPHOS 83 08/11/2014 1114   AST 22 08/11/2014 1114   ALT 25 08/11/2014 1114   BILITOT 1.0 08/11/2014 1114       ASSESSMENT AND PLAN:  Chronic myelogenous leukemia (CML), BCR-ABL1-positive She has started Tasigna on 08/15/2014 again after a short break in therapy due to free sample running out and the time it took her to get medication filled.    She is tolerating well.  Labs today as ordered.  Weekly labs x 3.  Return in 3 weeks for follow-up.    THERAPY PLAN:  Continue Tasigna as prescribed.  All questions were answered. The patient knows to call the clinic with any problems, questions or concerns. We can certainly see the patient much sooner if  necessary.  Patient and plan discussed with Dr. Ancil Linsey and she is in agreement with the aforementioned.   This note is electronically signed by: Doy Mince 08/18/2014 3:41 PM

## 2014-08-18 NOTE — Patient Instructions (Signed)
..  [place] at [facility] Discharge Instructions  RECOMMENDATIONS MADE BY THE CONSULTANT AND ANY TEST RESULTS WILL BE SENT TO YOUR REFERRING PHYSICIAN.  Return in 3 weeks to see Dr. Reva Bores weekly x  3  Thank you for choosing [place] at [facility] to provide your oncology and hematology care.  To afford each patient quality time with our provider, please arrive at least 15 minutes before your scheduled appointment time.    You need to re-schedule your appointment should you arrive 10 or more minutes late.  We strive to give you quality time with our providers, and arriving late affects you and other patients whose appointments are after yours.  Also, if you no show three or more times for appointments you may be dismissed from the clinic at the providers discretion.     Again, thank you for choosing [facility].  Our hope is that these requests will decrease the amount of time that you wait before being seen by our physicians.       _____________________________________________________________  Should you have questions after your visit to [facility], please contact our office at [phone] between the hours of 8:30 a.m. and 4:30 p.m.  Voicemails left after 4:30 p.m. will not be returned until the following business day.  For prescription refill requests, have your pharmacy contact our office.

## 2014-08-25 ENCOUNTER — Encounter (HOSPITAL_BASED_OUTPATIENT_CLINIC_OR_DEPARTMENT_OTHER): Payer: Commercial Managed Care - HMO

## 2014-08-25 DIAGNOSIS — C9210 Chronic myeloid leukemia, BCR/ABL-positive, not having achieved remission: Secondary | ICD-10-CM | POA: Diagnosis not present

## 2014-08-25 DIAGNOSIS — Z79899 Other long term (current) drug therapy: Secondary | ICD-10-CM

## 2014-08-25 DIAGNOSIS — D72829 Elevated white blood cell count, unspecified: Secondary | ICD-10-CM | POA: Diagnosis not present

## 2014-08-25 LAB — COMPREHENSIVE METABOLIC PANEL
ALK PHOS: 97 U/L (ref 38–126)
ALT: 28 U/L (ref 14–54)
AST: 29 U/L (ref 15–41)
Albumin: 4.3 g/dL (ref 3.5–5.0)
Anion gap: 10 (ref 5–15)
BUN: 13 mg/dL (ref 6–20)
CHLORIDE: 101 mmol/L (ref 101–111)
CO2: 23 mmol/L (ref 22–32)
CREATININE: 0.71 mg/dL (ref 0.44–1.00)
Calcium: 9.1 mg/dL (ref 8.9–10.3)
GFR calc non Af Amer: >60 mL/min (ref >60)
Glucose, Bld: 400 mg/dL — ABNORMAL HIGH (ref 65–99)
POTASSIUM: 3.7 mmol/L (ref 3.5–5.1)
Sodium: 134 mmol/L — ABNORMAL LOW (ref 135–145)
Total Bilirubin: 1.9 mg/dL — ABNORMAL HIGH (ref 0.3–1.2)
Total Protein: 7.7 g/dL (ref 6.5–8.1)

## 2014-08-25 LAB — CBC WITH DIFFERENTIAL/PLATELET
BASOS ABS: 0.3 10*3/uL — AB (ref 0.0–0.1)
Basophils Relative: 4 % — ABNORMAL HIGH (ref 0–1)
Eosinophils Absolute: 0.1 10*3/uL (ref 0.0–0.7)
Eosinophils Relative: 1 % (ref 0–5)
HEMATOCRIT: 39 % (ref 36.0–46.0)
HEMOGLOBIN: 12.7 g/dL (ref 12.0–15.0)
Lymphocytes Relative: 25 % (ref 12–46)
Lymphs Abs: 1.7 10*3/uL (ref 0.7–4.0)
MCH: 29.4 pg (ref 26.0–34.0)
MCHC: 32.6 g/dL (ref 30.0–36.0)
MCV: 90.3 fL (ref 78.0–100.0)
MONO ABS: 0.4 10*3/uL (ref 0.1–1.0)
MONOS PCT: 6 % (ref 3–12)
Neutro Abs: 4.4 10*3/uL (ref 1.7–7.7)
Neutrophils Relative %: 64 % (ref 43–77)
Platelets: 153 10*3/uL (ref 150–400)
RBC: 4.32 MIL/uL (ref 3.87–5.11)
RDW: 15 % (ref 11.5–15.5)
WBC: 6.9 10*3/uL (ref 4.0–10.5)

## 2014-08-25 LAB — MAGNESIUM: Magnesium: 1.9 mg/dL (ref 1.7–2.4)

## 2014-08-25 NOTE — Progress Notes (Signed)
..  April Page's reason for visit today are for labs as scheduled per MD orders.  Venipuncture performed with a 23 gauge butterfly needle to L Antecubital.  April Page tolerated venipuncture well and without incident; questions were answered and patient was discharged.

## 2014-09-01 ENCOUNTER — Encounter (HOSPITAL_COMMUNITY): Payer: Commercial Managed Care - HMO

## 2014-09-01 DIAGNOSIS — C9210 Chronic myeloid leukemia, BCR/ABL-positive, not having achieved remission: Secondary | ICD-10-CM

## 2014-09-01 DIAGNOSIS — Z79899 Other long term (current) drug therapy: Secondary | ICD-10-CM

## 2014-09-01 DIAGNOSIS — D72829 Elevated white blood cell count, unspecified: Secondary | ICD-10-CM | POA: Diagnosis not present

## 2014-09-01 LAB — CBC WITH DIFFERENTIAL/PLATELET
BASOS ABS: 0.1 10*3/uL (ref 0.0–0.1)
Basophils Relative: 1 % (ref 0–1)
Eosinophils Absolute: 0.1 10*3/uL (ref 0.0–0.7)
Eosinophils Relative: 2 % (ref 0–5)
HEMATOCRIT: 39.4 % (ref 36.0–46.0)
Hemoglobin: 12.8 g/dL (ref 12.0–15.0)
Lymphocytes Relative: 26 % (ref 12–46)
Lymphs Abs: 1.6 10*3/uL (ref 0.7–4.0)
MCH: 29.4 pg (ref 26.0–34.0)
MCHC: 32.5 g/dL (ref 30.0–36.0)
MCV: 90.6 fL (ref 78.0–100.0)
MONO ABS: 0.4 10*3/uL (ref 0.1–1.0)
MONOS PCT: 6 % (ref 3–12)
NEUTROS ABS: 3.9 10*3/uL (ref 1.7–7.7)
Neutrophils Relative %: 65 % (ref 43–77)
Platelets: 167 10*3/uL (ref 150–400)
RBC: 4.35 MIL/uL (ref 3.87–5.11)
RDW: 15 % (ref 11.5–15.5)
WBC: 6 10*3/uL (ref 4.0–10.5)

## 2014-09-01 LAB — COMPREHENSIVE METABOLIC PANEL
ALT: 40 U/L (ref 14–54)
AST: 35 U/L (ref 15–41)
Albumin: 4.4 g/dL (ref 3.5–5.0)
Alkaline Phosphatase: 103 U/L (ref 38–126)
Anion gap: 10 (ref 5–15)
BILIRUBIN TOTAL: 1.6 mg/dL — AB (ref 0.3–1.2)
BUN: 14 mg/dL (ref 6–20)
CALCIUM: 9.5 mg/dL (ref 8.9–10.3)
CHLORIDE: 104 mmol/L (ref 101–111)
CO2: 24 mmol/L (ref 22–32)
Creatinine, Ser: 0.67 mg/dL (ref 0.44–1.00)
Glucose, Bld: 360 mg/dL — ABNORMAL HIGH (ref 65–99)
Potassium: 3.7 mmol/L (ref 3.5–5.1)
SODIUM: 138 mmol/L (ref 135–145)
Total Protein: 8 g/dL (ref 6.5–8.1)

## 2014-09-01 LAB — MAGNESIUM: Magnesium: 2 mg/dL (ref 1.7–2.4)

## 2014-09-02 ENCOUNTER — Telehealth: Payer: Self-pay | Admitting: Gastroenterology

## 2014-09-02 NOTE — Telephone Encounter (Signed)
Opened in error

## 2014-09-06 DIAGNOSIS — H2510 Age-related nuclear cataract, unspecified eye: Secondary | ICD-10-CM | POA: Diagnosis not present

## 2014-09-06 DIAGNOSIS — H5210 Myopia, unspecified eye: Secondary | ICD-10-CM | POA: Diagnosis not present

## 2014-09-08 ENCOUNTER — Encounter (HOSPITAL_BASED_OUTPATIENT_CLINIC_OR_DEPARTMENT_OTHER): Payer: Commercial Managed Care - HMO

## 2014-09-08 ENCOUNTER — Encounter (HOSPITAL_COMMUNITY): Payer: Self-pay | Admitting: Oncology

## 2014-09-08 ENCOUNTER — Other Ambulatory Visit: Payer: Self-pay

## 2014-09-08 ENCOUNTER — Encounter (HOSPITAL_BASED_OUTPATIENT_CLINIC_OR_DEPARTMENT_OTHER): Payer: Commercial Managed Care - HMO | Admitting: Oncology

## 2014-09-08 VITALS — BP 143/71 | HR 81 | Temp 98.7°F | Resp 18 | Wt 185.2 lb

## 2014-09-08 DIAGNOSIS — C9210 Chronic myeloid leukemia, BCR/ABL-positive, not having achieved remission: Secondary | ICD-10-CM | POA: Diagnosis not present

## 2014-09-08 DIAGNOSIS — R079 Chest pain, unspecified: Secondary | ICD-10-CM | POA: Diagnosis not present

## 2014-09-08 DIAGNOSIS — Z79899 Other long term (current) drug therapy: Secondary | ICD-10-CM

## 2014-09-08 DIAGNOSIS — D72829 Elevated white blood cell count, unspecified: Secondary | ICD-10-CM | POA: Diagnosis not present

## 2014-09-08 LAB — COMPREHENSIVE METABOLIC PANEL
ALK PHOS: 95 U/L (ref 38–126)
ALT: 27 U/L (ref 14–54)
AST: 29 U/L (ref 15–41)
Albumin: 4 g/dL (ref 3.5–5.0)
Anion gap: 15 (ref 5–15)
BILIRUBIN TOTAL: 1.4 mg/dL — AB (ref 0.3–1.2)
BUN: 8 mg/dL (ref 6–20)
CO2: 21 mmol/L — ABNORMAL LOW (ref 22–32)
Calcium: 8.7 mg/dL — ABNORMAL LOW (ref 8.9–10.3)
Chloride: 101 mmol/L (ref 101–111)
Creatinine, Ser: 0.72 mg/dL (ref 0.44–1.00)
GFR calc Af Amer: >60 mL/min (ref >60)
Glucose, Bld: 383 mg/dL — ABNORMAL HIGH (ref 65–99)
POTASSIUM: 3.3 mmol/L — AB (ref 3.5–5.1)
Sodium: 137 mmol/L (ref 135–145)
TOTAL PROTEIN: 7.2 g/dL (ref 6.5–8.1)

## 2014-09-08 LAB — CBC WITH DIFFERENTIAL/PLATELET
BASOS PCT: 1 % (ref 0–1)
Basophils Absolute: 0 10*3/uL (ref 0.0–0.1)
EOS PCT: 1 % (ref 0–5)
Eosinophils Absolute: 0 10*3/uL (ref 0.0–0.7)
HEMATOCRIT: 38.7 % (ref 36.0–46.0)
Hemoglobin: 12.2 g/dL (ref 12.0–15.0)
LYMPHS PCT: 20 % (ref 12–46)
Lymphs Abs: 0.6 10*3/uL — ABNORMAL LOW (ref 0.7–4.0)
MCH: 28.7 pg (ref 26.0–34.0)
MCHC: 31.5 g/dL (ref 30.0–36.0)
MCV: 91.1 fL (ref 78.0–100.0)
MONO ABS: 0.3 10*3/uL (ref 0.1–1.0)
MONOS PCT: 10 % (ref 3–12)
NEUTROS ABS: 2 10*3/uL (ref 1.7–7.7)
Neutrophils Relative %: 68 % (ref 43–77)
PLATELETS: 185 10*3/uL (ref 150–400)
RBC: 4.25 MIL/uL (ref 3.87–5.11)
RDW: 15.2 % (ref 11.5–15.5)
WBC: 2.9 10*3/uL — ABNORMAL LOW (ref 4.0–10.5)

## 2014-09-08 LAB — MAGNESIUM: MAGNESIUM: 1.9 mg/dL (ref 1.7–2.4)

## 2014-09-08 NOTE — Progress Notes (Signed)
[name], MD [place] [address]  Chronic myelogenous leukemia (CML), BCR-ABL1-positive - Plan: EKG 12-Lead, BCR-ABL1, CML/ALL, PCR, QUANT, CBC with Differential, Comprehensive metabolic panel, BCR-ABL1, CML/ALL, PCR, QUANT, Magnesium  Chest pain, unspecified chest pain type - Plan: EKG 12-Lead  CURRENT THERAPY: Tasigna 150 mg BID every 12 hours  INTERVAL HISTORY: April Page 69 y.o. female returns for followup of CML having been followed by Dr. Tressie Stalker in past while taking Gleevac.  According to records, she failed Gleevac therapy after several years at which time she was switched to a newer TKI, but could not afford the co-pay.  Therefore, she stopped taking the medication on her own accord.  She is now on Tasigna beginning on 07/21/2014.    Chronic myelogenous leukemia (CML), BCR-ABL1-positive    Initial Diagnosis Chronic myelogenous leukemia (CML), BCR-ABL1-positive   07/14/2014 Bone Marrow Biopsy Bone Marrow, Aspirate,Biopsy, and Clot, right iliac - HYPERCELLULAR BONE MARROW WITH CHRONIC MYELOGENOUS LEUKEMIA. - SEE COMMENT. PERIPHERAL BLOOD: - CHRONIC MYELOGENOUS LEUKEMIA. Diagnosis Note Previous PCR analysis performed for bcr-abl1 is report   07/21/2014 - 08/04/2014 Chemotherapy Tasigna 150 mg BID   08/04/2014 Miscellaneous Ran out of 2 week sample secondary to issues with insurance approval.   08/15/2014 -  Chemotherapy Tasigna 150 mg BID   I personally reviewed and went over laboratory results with the patient.  The results are noted within this dictation.  Her glucose is poorly controlled.  She needs to work with her primary care provider to improve her glucose control.  If needed, I would recommend referral to Dr. Dorris Fetch for hyperglycemia control.  I will ultimately defer this to her primary care provider.  Tasigna can cause hyperglycemia, but her hyperglycemia pre-dates the start of Tasigna.  She re-started her Tasigna on 08/15/2014 after a  lapse in treatment due to free sample running out and time to her next refill due to insurance issues versus cost issues.  She is tolerating well without any complaints.    She notes that she needs cataract surgery and from a hematologic standpoint, she is free to do this.  She notes a left upper chest discomfort that radiates to shoulders.  She reports that it comes and goes throughout the day.  She denies any correlation to body position, activity, food.  She describes it as a pressure.  She notes that it has been ongoing for a number of days to 2 weeks.  She notes it in the AM and then it comes and goes throughout the day without intervention.  She denies any dyspnea or diaphoresis.    She notes nipple tenderness bilaterally.  Her mammogram was negative in March 2016.  She denies any discharge.  She notes that she has been sleeping on her abdomen at night and maybe that is causing her tenderness.  Physical exam is benign.  Past Medical History  Diagnosis Date  . GERD (gastroesophageal reflux disease)   . Hypertension   . Obesity   . Hyperlipidemia   . Pessary maintenance 07/01/2013  . H/O: CML (chronic myeloid leukemia)   . Cancer   . Blood dyscrasia   . Arthritis     stiff knees    has Pessary maintenance; Chronic diarrhea; Leukocytosis; Diabetes type 2, uncontrolled; Atypical chest pain; GERD (gastroesophageal reflux disease); and Chronic myelogenous leukemia (CML), BCR-ABL1-positive on her problem list.     has No Known Allergies.  Current Outpatient Prescriptions on File Prior to Visit  Medication  Sig Dispense Refill  . amLODipine (NORVASC) 10 MG tablet Take 10 mg by mouth daily.    Marland Kitchen lactase (LACTAID) 3000 UNITS tablet Take 3,000 Units by mouth as needed (uses when eating dairy products).    . Multiple Vitamin (MULTIVITAMIN WITH MINERALS) TABS tablet Take 1 tablet by mouth daily.    . naproxen sodium (ANAPROX) 220 MG tablet Take 220 mg by mouth 2 (two) times daily as needed (for  pain).    . nilotinib (TASIGNA) 150 MG capsule Take 2 capsules (300 mg total) by mouth every 12 (twelve) hours. 120 capsule 6  . ondansetron (ZOFRAN) 8 MG tablet Take 1 tablet (8 mg total) by mouth every 8 (eight) hours as needed for nausea or vomiting. (Patient not taking: Reported on 09/08/2014) 30 tablet 1   No current facility-administered medications on file prior to visit.    Past Surgical History  Procedure Laterality Date  . Cholecystectomy    . Abdominal hysterectomy    . Hernia repair    . Oophorectomy    . Esophagogastroduodenoscopy  06/09/2002    JME:QASTMH upper gastrointestinal tract s/p  54-French Maloney dilator  . Colonoscopy  06/09/2002    DQQ:IWLNLGXQ hemorrhoids; otherwise normal rectum, colon   . Cystocele/rectocele repair  2009  . Colonoscopy N/A 08/25/2013    JJH:ERDEYCX diverticulosis. Single colonic polyp-removed  s/p segmental biopsy and stool sample. random colon bx negative. +benign leiomyoma.    Denies any headaches, dizziness, double vision, fevers, chills, night sweats, nausea, vomiting, diarrhea, constipation, chest pain, heart palpitations, shortness of breath, blood in stool, black tarry stool, urinary pain, urinary burning, urinary frequency, hematuria.   PHYSICAL EXAMINATION  ECOG PERFORMANCE STATUS: 0 - Asymptomatic  Filed Vitals:   09/08/14 1434  BP: 143/71  Pulse: 81  Temp: 98.7 F (37.1 C)  Resp: 18    GENERAL:alert, no distress, well nourished, well developed, comfortable, cooperative, smiling and accompanied by her daughter. SKIN: skin color, texture, turgor are normal, no rashes or significant lesions HEAD: Normocephalic, No masses, lesions, tenderness or abnormalities EYES: normal, PERRLA, EOMI, Conjunctiva are pink and non-injected EARS: External ears normal OROPHARYNX:lips, buccal mucosa, and tongue normal and mucous membranes are moist  NECK: supple, trachea midline LYMPH:  No adenopathy noted BREAST: No masses or lesions.  No  nipple changes.  Thick nipples noted, but no distortion or palpable abnormalities. LUNGS: clear to auscultation  HEART: regular rate & rhythm ABDOMEN:abdomen soft and normal bowel sounds BACK: Back symmetric, no curvature. EXTREMITIES:less then 2 second capillary refill, no skin discoloration, no cyanosis  NEURO: alert & oriented x 3 with fluent speech, no focal motor/sensory deficits, gait normal   LABORATORY DATA: CBC    Component Value Date/Time   WBC 6.0 09/01/2014 1319   RBC 4.35 09/01/2014 1319   HGB 12.8 09/01/2014 1319   HCT 39.4 09/01/2014 1319   PLT 167 09/01/2014 1319   MCV 90.6 09/01/2014 1319   MCH 29.4 09/01/2014 1319   MCHC 32.5 09/01/2014 1319   RDW 15.0 09/01/2014 1319   LYMPHSABS 1.6 09/01/2014 1319   MONOABS 0.4 09/01/2014 1319   EOSABS 0.1 09/01/2014 1319   BASOSABS 0.1 09/01/2014 1319      Chemistry      Component Value Date/Time   NA 138 09/01/2014 1319   K 3.7 09/01/2014 1319   CL 104 09/01/2014 1319   CO2 24 09/01/2014 1319   BUN 14 09/01/2014 1319   CREATININE 0.67 09/01/2014 1319      Component Value Date/Time  CALCIUM 9.5 09/01/2014 1319   ALKPHOS 103 09/01/2014 1319   AST 35 09/01/2014 1319   ALT 40 09/01/2014 1319   BILITOT 1.6* 09/01/2014 1319       ASSESSMENT AND PLAN:  Chronic myelogenous leukemia (CML), BCR-ABL1-positive CML having been followed by Dr. Tressie Stalker in past while taking Gleevac.  According to records, she failed Gleevac therapy after several years at which time she was switched to a newer TKI, but could not afford the co-pay.  Therefore, she stopped taking the medication on her own accord.  She is now on Tasigna beginning on 07/21/2014.  She notes atypical left chest pain.  EKG ordered today for chest pain and also part of surveillance for Tasigna for prolonged QT interval.  EKG is very stable without any signs of MI nor QT prolongation.  She knows to report to the ED with any increased chest pain.  She is tolerating  well.  From a hematologic standpoint, she is free to have cataract surgery.  Nipple tenderness is unexplained.  Mammogram 3 months ago is negative.  Exam is negative.  Unrelated to CML and Tasigna.    Labs in 4 weeks: CBC diff, CMET, Mg, and BCR/ABL.  She needs stricter control of hyperglycemia.  Patient reports that she has an upcoming appointment with Dr. Gerarda Fraction to work on that.  Tasigna can cause hyperglycemia, but the patient's hyperglycemia pre-dates the start of Tasigna.  Therefore, Tasigna-induced hyperglycemia is unlikely.  She may benefit from an Endocrinology referral, but I will defer that decision to the patient's primary care provider.  Return in 4 weeks for follow-up.    THERAPY PLAN:  Continue Tasigna as prescribed.  She will need BCR/ABL in 4 weeks.  Debate is ongoing regarding the role of bone marrow testing at 6 month mark.  Will defer to Dr. Whitney Muse.  In the interim, she needs stricter control of her hyperglycemia and I will defer to her primary care provider.  All questions were answered. The patient knows to call the clinic with any problems, questions or concerns. We can certainly see the patient much sooner if necessary.  Patient and plan discussed with Dr. Ancil Linsey and she is in agreement with the aforementioned.   This note is electronically signed by: Doy Mince 09/08/2014 3:35 PM

## 2014-09-08 NOTE — Assessment & Plan Note (Addendum)
CML having been followed by Dr. Tressie Stalker in past while taking Gleevac.  According to records, she failed Gleevac therapy after several years at which time she was switched to a newer TKI, but could not afford the co-pay.  Therefore, she stopped taking the medication on her own accord.  She is now on Tasigna beginning on 07/21/2014.  She notes atypical left chest pain.  EKG ordered today for chest pain and also part of surveillance for Tasigna for prolonged QT interval.  EKG is very stable without any signs of MI nor QT prolongation.  She knows to report to the ED with any increased chest pain.  She is tolerating well.  From a hematologic standpoint, she is free to have cataract surgery.  Nipple tenderness is unexplained.  Mammogram 3 months ago is negative.  Exam is negative.  Unrelated to CML and Tasigna.    Labs in 4 weeks: CBC diff, CMET, Mg, and BCR/ABL.  She needs stricter control of hyperglycemia.  Patient reports that she has an upcoming appointment with Dr. Gerarda Fraction to work on that.  Tasigna can cause hyperglycemia, but the patient's hyperglycemia pre-dates the start of Tasigna.  Therefore, Tasigna-induced hyperglycemia is unlikely.  She may benefit from an Endocrinology referral, but I will defer that decision to the patient's primary care provider.  Return in 4 weeks for follow-up.

## 2014-09-08 NOTE — Patient Instructions (Signed)
..  [place] at [facility] Discharge Instructions  RECOMMENDATIONS MADE BY THE CONSULTANT AND ANY TEST RESULTS WILL BE SENT TO YOUR REFERRING PHYSICIAN.  Exam today per T. [name] PA-C No abnormalities on breast exam We will do an EKG due to your complaints of chest pain---EKG was normal Return in 4 weeks and we will do the special lab test at that time   Thank you for choosing [place] at [facility] to provide your oncology and hematology care.  To afford each patient quality time with our provider, please arrive at least 15 minutes before your scheduled appointment time.    You need to re-schedule your appointment should you arrive 10 or more minutes late.  We strive to give you quality time with our providers, and arriving late affects you and other patients whose appointments are after yours.  Also, if you no show three or more times for appointments you may be dismissed from the clinic at the providers discretion.     Again, thank you for choosing [facility].  Our hope is that these requests will decrease the amount of time that you wait before being seen by our physicians.       _____________________________________________________________  Should you have questions after your visit to [facility], please contact our office at [phone] between the hours of 8:30 a.m. and 4:30 p.m.  Voicemails left after 4:30 p.m. will not be returned until the following business day.  For prescription refill requests, have your pharmacy contact our office.

## 2014-09-08 NOTE — Progress Notes (Signed)
..  [name] [name]'s reason for visit today are for labs as scheduled per MD orders.  Venipuncture performed with a 23 gauge butterfly needle to L Antecubital.  April Page tolerated venipuncture well and without incident; questions were answered and patient was discharged.

## 2014-09-15 ENCOUNTER — Other Ambulatory Visit (HOSPITAL_COMMUNITY): Payer: Self-pay

## 2014-09-15 ENCOUNTER — Ambulatory Visit: Payer: Commercial Managed Care - HMO | Admitting: Adult Health

## 2014-09-15 DIAGNOSIS — C9210 Chronic myeloid leukemia, BCR/ABL-positive, not having achieved remission: Secondary | ICD-10-CM

## 2014-09-16 ENCOUNTER — Encounter (HOSPITAL_COMMUNITY): Payer: Commercial Managed Care - HMO | Attending: Hematology & Oncology

## 2014-09-16 DIAGNOSIS — C9210 Chronic myeloid leukemia, BCR/ABL-positive, not having achieved remission: Secondary | ICD-10-CM

## 2014-09-16 DIAGNOSIS — D72829 Elevated white blood cell count, unspecified: Secondary | ICD-10-CM | POA: Insufficient documentation

## 2014-09-16 DIAGNOSIS — E11319 Type 2 diabetes mellitus with unspecified diabetic retinopathy without macular edema: Secondary | ICD-10-CM | POA: Diagnosis not present

## 2014-09-16 NOTE — Progress Notes (Signed)
Repeat labs drawn/no charge lab error.

## 2014-09-20 LAB — BCR-ABL1, CML/ALL, PCR, QUANT: b3a2 transcript: 4.122 %

## 2014-10-05 ENCOUNTER — Ambulatory Visit (INDEPENDENT_AMBULATORY_CARE_PROVIDER_SITE_OTHER): Payer: Commercial Managed Care - HMO | Admitting: Adult Health

## 2014-10-05 ENCOUNTER — Encounter: Payer: Self-pay | Admitting: Adult Health

## 2014-10-05 VITALS — BP 120/60 | HR 72 | Ht 64.0 in | Wt 178.0 lb

## 2014-10-05 DIAGNOSIS — N898 Other specified noninflammatory disorders of vagina: Secondary | ICD-10-CM

## 2014-10-05 DIAGNOSIS — Z4689 Encounter for fitting and adjustment of other specified devices: Secondary | ICD-10-CM

## 2014-10-05 DIAGNOSIS — L298 Other pruritus: Secondary | ICD-10-CM | POA: Diagnosis not present

## 2014-10-05 NOTE — Patient Instructions (Signed)
Follow up in 4 months 

## 2014-10-05 NOTE — Progress Notes (Signed)
Subjective:     Patient ID: [name], female   DOB: September 03, 1945, 68 y.o.   MRN: [id number]  HPI April Page is a 69 year old black female in for pessary maintenance and has vaginal itch.  Review of Systems Patient denies any headaches, hearing loss, fatigue, blurred vision, shortness of breath, chest pain, abdominal pain, problems with bowel movements, urination, or intercourse(not having sex). No joint pain or mood swings.+ vaginal itch Reviewed past medical,surgical, social and family history. Reviewed medications and allergies.     Objective:   Physical Exam BP 120/60 mmHg  Pulse 72  Ht 5\' 4"  (1.626 m)  Wt 178 lb (80.74 kg)  BMI 30.54 kg/m2   Skin warm and dry.Pelvic: external genitalia is normal in appearance no lesions, vagina: scant discharge without odor, pessary easily removed, and washed with soap and water and dried, inserted speculum, no lesion or irritation noted,and urethra has no lesions or masses noted, cervix and uterus are absent, adnexa: no masses or tenderness noted. Bladder is non tender and no masses felt. Pessary reinserted.Told her she could use monistat for itching on labia.   Assessment:     Pessary maintenance  Vaginal itch    Plan:     Try monistat for itching, if not better call me back Follow up in 4 months for pessary maintenance

## 2014-10-07 ENCOUNTER — Encounter (HOSPITAL_COMMUNITY): Payer: Commercial Managed Care - HMO

## 2014-10-07 ENCOUNTER — Encounter (HOSPITAL_BASED_OUTPATIENT_CLINIC_OR_DEPARTMENT_OTHER): Payer: Commercial Managed Care - HMO | Admitting: Oncology

## 2014-10-07 ENCOUNTER — Encounter (HOSPITAL_COMMUNITY): Payer: Self-pay | Admitting: Oncology

## 2014-10-07 ENCOUNTER — Other Ambulatory Visit (HOSPITAL_COMMUNITY): Payer: Commercial Managed Care - HMO

## 2014-10-07 VITALS — BP 131/72 | HR 83 | Temp 98.9°F | Resp 15 | Wt 177.7 lb

## 2014-10-07 DIAGNOSIS — C9210 Chronic myeloid leukemia, BCR/ABL-positive, not having achieved remission: Secondary | ICD-10-CM | POA: Diagnosis not present

## 2014-10-07 DIAGNOSIS — D72829 Elevated white blood cell count, unspecified: Secondary | ICD-10-CM | POA: Diagnosis not present

## 2014-10-07 DIAGNOSIS — R739 Hyperglycemia, unspecified: Secondary | ICD-10-CM

## 2014-10-07 LAB — CBC WITH DIFFERENTIAL/PLATELET
Basophils Absolute: 0 10*3/uL (ref 0.0–0.1)
Basophils Relative: 0 % (ref 0–1)
EOS PCT: 3 % (ref 0–5)
Eosinophils Absolute: 0.2 10*3/uL (ref 0.0–0.7)
HCT: 41.8 % (ref 36.0–46.0)
HEMOGLOBIN: 13.6 g/dL (ref 12.0–15.0)
LYMPHS PCT: 32 % (ref 12–46)
Lymphs Abs: 2.3 10*3/uL (ref 0.7–4.0)
MCH: 28.7 pg (ref 26.0–34.0)
MCHC: 32.5 g/dL (ref 30.0–36.0)
MCV: 88.2 fL (ref 78.0–100.0)
MONO ABS: 0.6 10*3/uL (ref 0.1–1.0)
MONOS PCT: 8 % (ref 3–12)
NEUTROS ABS: 4.2 10*3/uL (ref 1.7–7.7)
NEUTROS PCT: 57 % (ref 43–77)
PLATELETS: 201 10*3/uL (ref 150–400)
RBC: 4.74 MIL/uL (ref 3.87–5.11)
RDW: 14.3 % (ref 11.5–15.5)
WBC: 7.3 10*3/uL (ref 4.0–10.5)

## 2014-10-07 LAB — COMPREHENSIVE METABOLIC PANEL
ALBUMIN: 4.4 g/dL (ref 3.5–5.0)
ALK PHOS: 94 U/L (ref 38–126)
ALT: 37 U/L (ref 14–54)
AST: 34 U/L (ref 15–41)
Anion gap: 9 (ref 5–15)
BILIRUBIN TOTAL: 1.1 mg/dL (ref 0.3–1.2)
BUN: 15 mg/dL (ref 6–20)
CO2: 27 mmol/L (ref 22–32)
Calcium: 9.8 mg/dL (ref 8.9–10.3)
Chloride: 103 mmol/L (ref 101–111)
Creatinine, Ser: 0.83 mg/dL (ref 0.44–1.00)
GFR calc Af Amer: >60 mL/min (ref >60)
GFR calc non Af Amer: >60 mL/min (ref >60)
GLUCOSE: 274 mg/dL — AB (ref 65–99)
Potassium: 3.8 mmol/L (ref 3.5–5.1)
Sodium: 139 mmol/L (ref 135–145)
TOTAL PROTEIN: 7.8 g/dL (ref 6.5–8.1)

## 2014-10-07 LAB — MAGNESIUM: Magnesium: 2.2 mg/dL (ref 1.7–2.4)

## 2014-10-07 NOTE — Patient Instructions (Signed)
[place] at [facility] Discharge Instructions  RECOMMENDATIONS MADE BY THE CONSULTANT AND ANY TEST RESULTS WILL BE SENT TO YOUR REFERRING PHYSICIAN.  Exam and discussion by Robynn Pane, PA-C Will get labs and an EKG today You can take over the counter Mucinex and an expectorant as needed. Continue Tasigna Labs in 4 weeks Labs and EKG in 8 weeks and office visit in 8 weeks.  Thank you for choosing [place] at [facility] to provide your oncology and hematology care.  To afford each patient quality time with our provider, please arrive at least 15 minutes before your scheduled appointment time.    You need to re-schedule your appointment should you arrive 10 or more minutes late.  We strive to give you quality time with our providers, and arriving late affects you and other patients whose appointments are after yours.  Also, if you no show three or more times for appointments you may be dismissed from the clinic at the providers discretion.     Again, thank you for choosing [facility].  Our hope is that these requests will decrease the amount of time that you wait before being seen by our physicians.       _____________________________________________________________  Should you have questions after your visit to [facility], please contact our office at [phone] between the hours of 8:30 a.m. and 4:30 p.m.  Voicemails left after 4:30 p.m. will not be returned until the following business day.  For prescription refill requests, have your pharmacy contact our office.

## 2014-10-07 NOTE — Assessment & Plan Note (Addendum)
CML having been followed by Dr. Tressie Stalker in past while taking Gleevac.  According to records, she failed Gleevac therapy after several years at which time she was switched to a newer TKI, but could not afford the co-pay.  Therefore, she stopped taking the medication on her own accord.  She is now on Tasigna beginning on 07/21/2014.  She is tolerating well.  From a hematologic standpoint, she is free to have cataract surgery.  Labs today as ordered.  EKG today.  Due to an active code BLUE in the hospital, respiratory is unable to perform an EKG at this time.  She will return the beginning of next week for an EKG.  Labs in 4 weeks: CBC diff, CMET  Labs in 2 months: CBC diff, CMET, Mg, and BCR/ABL.  EKG in 2 months too.  BCR/ABL is down from 71.22 to 4.122.  She has some dry skin on her arms and I have recommended moisturizing lotion.  This may be contributing to her pruritis.  Again, no rash noted and symptoms are intermittent.  I have recommended an expectorant with plenty of H2O for her phlegm.  Return in 8 weeks for follow-up.

## 2014-10-07 NOTE — Progress Notes (Signed)
April Page presented for labwork. Labs per MD order drawn via Peripheral Line 21 gauge needle inserted in left AC  Good blood return present. Procedure without incident.  Needle removed intact. Patient tolerated procedure well.

## 2014-10-07 NOTE — Progress Notes (Signed)
[name], MD [place] [address]  Chronic myelogenous leukemia (CML), BCR-ABL1-positive - Plan: CBC with Differential, Comprehensive metabolic panel, Magnesium, BCR-ABL1, CML/ALL, PCR, QUANT, EKG 12-Lead, CBC with Differential, Comprehensive metabolic panel, Magnesium, CBC with Differential, Comprehensive metabolic panel  CURRENT THERAPY: Tasigna 150 mg BID every 12 hours  INTERVAL HISTORY: April Page 69 y.o. female returns for followup of CML having been followed by Dr. Tressie Stalker in past while taking Gleevac.  According to records, she failed Gleevac therapy after several years at which time she was switched to a newer TKI, but could not afford the co-pay.  Therefore, she stopped taking the medication on her own accord.  She is now on Tasigna beginning on 07/21/2014.    Chronic myelogenous leukemia (CML), BCR-ABL1-positive    Initial Diagnosis Chronic myelogenous leukemia (CML), BCR-ABL1-positive   07/14/2014 Bone Marrow Biopsy Bone Marrow, Aspirate,Biopsy, and Clot, right iliac - HYPERCELLULAR BONE MARROW WITH CHRONIC MYELOGENOUS LEUKEMIA. - SEE COMMENT. PERIPHERAL BLOOD: - CHRONIC MYELOGENOUS LEUKEMIA. Diagnosis Note Previous PCR analysis performed for bcr-abl1 is report   07/21/2014 - 08/04/2014 Chemotherapy Tasigna 150 mg BID   08/04/2014 Miscellaneous Ran out of 2 week sample secondary to issues with insurance approval.   08/15/2014 -  Chemotherapy Tasigna 150 mg BID   I personally reviewed and went over laboratory results with the patient.  The results are noted within this dictation.   She notes a pruritis of her skin that is intermittent and migratory.  She denies any rash.  She admits that this symptom pre-date Tasigna.  She notes phlegm that "is stuck in the back of my throat."  She denies any fevers.  She denies any colored sputum production.  She is planning a trip within the next few days to [facility] for the day.    She denies any  B symptoms.  Past Medical History  Diagnosis Date  . GERD (gastroesophageal reflux disease)   . Hypertension   . Obesity   . Hyperlipidemia   . Pessary maintenance 07/01/2013  . H/O: CML (chronic myeloid leukemia)   . Cancer   . Blood dyscrasia   . Arthritis     stiff knees    has Pessary maintenance; Chronic diarrhea; Leukocytosis; Diabetes type 2, uncontrolled; Atypical chest pain; GERD (gastroesophageal reflux disease); Chronic myelogenous leukemia (CML), BCR-ABL1-positive; and Itching in the vaginal area on her problem list.     has No Known Allergies.  Current Outpatient Prescriptions on File Prior to Visit  Medication Sig Dispense Refill  . amLODipine (NORVASC) 10 MG tablet Take 10 mg by mouth daily.    Marland Kitchen lactase (LACTAID) 3000 UNITS tablet Take 3,000 Units by mouth as needed (uses when eating dairy products).    . Multiple Vitamin (MULTIVITAMIN WITH MINERALS) TABS tablet Take 1 tablet by mouth daily.    . naproxen sodium (ANAPROX) 220 MG tablet Take 220 mg by mouth 2 (two) times daily as needed (for pain).    . nilotinib (TASIGNA) 150 MG capsule Take 2 capsules (300 mg total) by mouth every 12 (twelve) hours. 120 capsule 6  . ondansetron (ZOFRAN) 8 MG tablet Take 1 tablet (8 mg total) by mouth every 8 (eight) hours as needed for nausea or vomiting. (Patient not taking: Reported on 10/05/2014) 30 tablet 1   No current facility-administered medications on file prior to visit.    Past Surgical History  Procedure Laterality Date  . Cholecystectomy    . Abdominal hysterectomy    .  Hernia repair    . Oophorectomy    . Esophagogastroduodenoscopy  06/09/2002    JJO:ACZYSA upper gastrointestinal tract s/p  54-French Maloney dilator  . Colonoscopy  06/09/2002    YTK:[id number] hemorrhoids; otherwise normal rectum, colon   . Cystocele/rectocele repair  2009  . Colonoscopy N/A 08/25/2013    TFT:[id number] diverticulosis. Single colonic polyp-removed  s/p segmental biopsy and stool  sample. random colon bx negative. +benign leiomyoma.    Denies any headaches, dizziness, double vision, fevers, chills, night sweats, nausea, vomiting, diarrhea, constipation, chest pain, heart palpitations, shortness of breath, blood in stool, black tarry stool, urinary pain, urinary burning, urinary frequency, hematuria.   PHYSICAL EXAMINATION  ECOG PERFORMANCE STATUS: 0 - Asymptomatic  Filed Vitals:   10/07/14 1446  BP: 131/72  Pulse: 83  Temp: 98.9 F (37.2 C)  Resp: 15    GENERAL:alert, no distress, well nourished, well developed, comfortable, cooperative, smiling and accompanied by her daughter. SKIN: skin color, texture, turgor are normal, no rashes or significant lesions HEAD: Normocephalic, No masses, lesions, tenderness or abnormalities EYES: normal, PERRLA, EOMI, Conjunctiva are pink and non-injected EARS: External ears normal OROPHARYNX:lips, buccal mucosa, and tongue normal and mucous membranes are moist  NECK: supple, trachea midline LYMPH:  No adenopathy noted BREAST: No examined. LUNGS: clear to auscultation  HEART: regular rate & rhythm ABDOMEN:abdomen soft and normal bowel sounds BACK: Back symmetric, no curvature. EXTREMITIES:less then 2 second capillary refill, no skin discoloration, no cyanosis  NEURO: alert & oriented x 3 with fluent speech, no focal motor/sensory deficits, gait normal   LABORATORY DATA: CBC    Component Value Date/Time   WBC 2.9* 09/08/2014 1430   RBC 4.25 09/08/2014 1430   HGB 12.2 09/08/2014 1430   HCT 38.7 09/08/2014 1430   PLT 185 09/08/2014 1430   MCV 91.1 09/08/2014 1430   MCH 28.7 09/08/2014 1430   MCHC 31.5 09/08/2014 1430   RDW 15.2 09/08/2014 1430   LYMPHSABS 0.6* 09/08/2014 1430   MONOABS 0.3 09/08/2014 1430   EOSABS 0.0 09/08/2014 1430   BASOSABS 0.0 09/08/2014 1430      Chemistry      Component Value Date/Time   NA 137 09/08/2014 1430   K 3.3* 09/08/2014 1430   CL 101 09/08/2014 1430   CO2 21* 09/08/2014  1430   BUN 8 09/08/2014 1430   CREATININE 0.72 09/08/2014 1430      Component Value Date/Time   CALCIUM 8.7* 09/08/2014 1430   ALKPHOS 95 09/08/2014 1430   AST 29 09/08/2014 1430   ALT 27 09/08/2014 1430   BILITOT 1.4* 09/08/2014 1430       ASSESSMENT AND PLAN:  Chronic myelogenous leukemia (CML), BCR-ABL1-positive CML having been followed by Dr. Tressie Stalker in past while taking Gleevac.  According to records, she failed Gleevac therapy after several years at which time she was switched to a newer TKI, but could not afford the co-pay.  Therefore, she stopped taking the medication on her own accord.  She is now on Tasigna beginning on 07/21/2014.  She is tolerating well.  From a hematologic standpoint, she is free to have cataract surgery.  Labs today as ordered.  EKG today.  Labs in 4 weeks: CBC diff, CMET  Labs in 2 months: CBC diff, CMET, Mg, and BCR/ABL.  EKG in 2 months too.  She has some dry skin on her arms and I have recommended moisturizing lotion.  This may be contributing to her pruritis.  Again, no rash noted and symptoms are  intermittent.  I have recommended an expectorant with plenty of H2O for her phlegm.  Return in 8 weeks for follow-up.  THERAPY PLAN:  Continue Tasigna as prescribed.  She will need BCR/ABL in 4 weeks.  Debate is ongoing regarding the role of bone marrow testing at 6 month mark.  Will defer to Dr. Whitney Muse.  In the interim, she needs stricter control of her hyperglycemia and I will defer to her primary care provider.  All questions were answered. The patient knows to call the clinic with any problems, questions or concerns. We can certainly see the patient much sooner if necessary.  Patient and plan discussed with Dr. Ancil Linsey and she is in agreement with the aforementioned.   This note is electronically signed by: Doy Mince 10/07/2014 3:44 PM

## 2014-10-10 ENCOUNTER — Other Ambulatory Visit: Payer: Self-pay

## 2014-10-10 ENCOUNTER — Encounter (HOSPITAL_COMMUNITY): Payer: Commercial Managed Care - HMO | Attending: Hematology & Oncology

## 2014-10-10 DIAGNOSIS — D72829 Elevated white blood cell count, unspecified: Secondary | ICD-10-CM | POA: Diagnosis not present

## 2014-10-10 NOTE — Progress Notes (Signed)
EKG ONLY

## 2014-10-12 ENCOUNTER — Other Ambulatory Visit (HOSPITAL_COMMUNITY): Payer: Self-pay | Admitting: Oncology

## 2014-10-12 ENCOUNTER — Telehealth (HOSPITAL_COMMUNITY): Payer: Self-pay | Admitting: Oncology

## 2014-10-12 ENCOUNTER — Encounter (HOSPITAL_COMMUNITY): Payer: Self-pay | Admitting: Oncology

## 2014-10-12 ENCOUNTER — Telehealth (HOSPITAL_COMMUNITY): Payer: Self-pay

## 2014-10-12 DIAGNOSIS — C9210 Chronic myeloid leukemia, BCR/ABL-positive, not having achieved remission: Secondary | ICD-10-CM

## 2014-10-12 DIAGNOSIS — I10 Essential (primary) hypertension: Secondary | ICD-10-CM

## 2014-10-12 MED ORDER — LISINOPRIL 10 MG PO TABS: 10.0000 mg | ORAL_TABLET | Freq: Every day | ORAL | Status: DC

## 2014-10-12 NOTE — Telephone Encounter (Signed)
I have discussed the patient's case with Dr. Jerrye Noble at [facility].  In short, the patient is a gleevac failure and recently started Tasigna back in June 2016 with response to therapy noted.  Her baseline EKG demonstrated a QTc of 458 ms on 07/20/2014.  Subsequent EKGs show a QTc of 466 ms (5/26), 476 ms (6/30), 481 ms (8/1).  Dr. Florene Glen recommended continuing Tasigna until QTc is consistently > 500 ms.  Almyra Birman 10/12/2014 9:27 AM

## 2014-10-12 NOTE — Telephone Encounter (Addendum)
I have reviewed medications with pharmacy. Norvasc can increase the serum concentrations of Tasigna and increase risk of QTc prolongation.  Will ask nurse to call the patient with following directions: Stop Norvasc Hold Tasigna x 3 days, then restart. I will escribe another BP medication Lisinopril 10 mg daily  Return in 1 week for BP check and EKG.  [name],[name] 10/12/2014 2:30 PM  Spoke with April Page and instructions given.  Will stop the Tasigna today and will restart on Sunday.  To discontinue her Norvasc and start Lisinopril tomorrow.  Appointment for BP check and EKG made for 10/19/14.  Patient verbalized understanding of instructions.

## 2014-10-12 NOTE — Progress Notes (Signed)
I have reviewed medications with pharmacy.  Norvasc can increase the serum concentrations of Tasigna and increase risk of QTc prolongation.  Will ask nurse to call the patient with following directions: Stop Norvasc Hold Tasigna x 3 days, then restart. I will escribe another BP medication  Lisinopril 10 mg daily  Return in 1 week for BP check and EKG.  [name],[name] 10/12/2014 2:30 PM

## 2014-10-19 ENCOUNTER — Encounter (HOSPITAL_COMMUNITY): Payer: Commercial Managed Care - HMO

## 2014-10-19 DIAGNOSIS — C9210 Chronic myeloid leukemia, BCR/ABL-positive, not having achieved remission: Secondary | ICD-10-CM

## 2014-10-19 DIAGNOSIS — D72829 Elevated white blood cell count, unspecified: Secondary | ICD-10-CM | POA: Diagnosis not present

## 2014-10-19 NOTE — Progress Notes (Signed)
VS assessed.  EKG obtained.  VS results and EKG given to T. [name], PA-C.  Advised to tell pt to restart chemo pill and return as scheduled on for labs on 8./26/16.

## 2014-11-01 ENCOUNTER — Encounter (HOSPITAL_BASED_OUTPATIENT_CLINIC_OR_DEPARTMENT_OTHER): Payer: Commercial Managed Care - HMO

## 2014-11-01 DIAGNOSIS — D72829 Elevated white blood cell count, unspecified: Secondary | ICD-10-CM | POA: Diagnosis not present

## 2014-11-01 DIAGNOSIS — C9210 Chronic myeloid leukemia, BCR/ABL-positive, not having achieved remission: Secondary | ICD-10-CM

## 2014-11-01 LAB — COMPREHENSIVE METABOLIC PANEL
ALK PHOS: 79 U/L (ref 38–126)
ALT: 29 U/L (ref 14–54)
AST: 25 U/L (ref 15–41)
Albumin: 4.2 g/dL (ref 3.5–5.0)
Anion gap: 9 (ref 5–15)
BUN: 10 mg/dL (ref 6–20)
CHLORIDE: 103 mmol/L (ref 101–111)
CO2: 26 mmol/L (ref 22–32)
CREATININE: 0.64 mg/dL (ref 0.44–1.00)
Calcium: 9.3 mg/dL (ref 8.9–10.3)
GFR calc non Af Amer: >60 mL/min (ref >60)
Glucose, Bld: 291 mg/dL — ABNORMAL HIGH (ref 65–99)
Potassium: 3.7 mmol/L (ref 3.5–5.1)
Sodium: 138 mmol/L (ref 135–145)
TOTAL PROTEIN: 7.5 g/dL (ref 6.5–8.1)
Total Bilirubin: 1.4 mg/dL — ABNORMAL HIGH (ref 0.3–1.2)

## 2014-11-01 LAB — CBC WITH DIFFERENTIAL/PLATELET
Basophils Absolute: 0 10*3/uL (ref 0.0–0.1)
Basophils Relative: 0 % (ref 0–1)
EOS PCT: 3 % (ref 0–5)
Eosinophils Absolute: 0.2 10*3/uL (ref 0.0–0.7)
HCT: 38.8 % (ref 36.0–46.0)
Hemoglobin: 12.7 g/dL (ref 12.0–15.0)
LYMPHS ABS: 2.1 10*3/uL (ref 0.7–4.0)
LYMPHS PCT: 33 % (ref 12–46)
MCH: 28.5 pg (ref 26.0–34.0)
MCHC: 32.7 g/dL (ref 30.0–36.0)
MCV: 87.2 fL (ref 78.0–100.0)
Monocytes Absolute: 0.5 10*3/uL (ref 0.1–1.0)
Monocytes Relative: 8 % (ref 3–12)
NEUTROS ABS: 3.6 10*3/uL (ref 1.7–7.7)
Neutrophils Relative %: 56 % (ref 43–77)
PLATELETS: 197 10*3/uL (ref 150–400)
RBC: 4.45 MIL/uL (ref 3.87–5.11)
RDW: 14.9 % (ref 11.5–15.5)
WBC: 6.4 10*3/uL (ref 4.0–10.5)

## 2014-11-01 LAB — MAGNESIUM: MAGNESIUM: 2 mg/dL (ref 1.7–2.4)

## 2014-11-01 NOTE — Progress Notes (Signed)
Labs drawn

## 2014-11-04 ENCOUNTER — Encounter (HOSPITAL_COMMUNITY): Payer: Commercial Managed Care - HMO

## 2014-11-15 ENCOUNTER — Other Ambulatory Visit (HOSPITAL_COMMUNITY): Payer: Self-pay | Admitting: Oncology

## 2014-11-15 DIAGNOSIS — I10 Essential (primary) hypertension: Secondary | ICD-10-CM

## 2014-11-15 MED ORDER — LISINOPRIL 10 MG PO TABS: 10.0000 mg | ORAL_TABLET | Freq: Every day | ORAL | Status: DC

## 2014-12-02 ENCOUNTER — Encounter (HOSPITAL_COMMUNITY): Payer: Commercial Managed Care - HMO | Attending: Hematology & Oncology

## 2014-12-02 ENCOUNTER — Encounter (HOSPITAL_COMMUNITY): Payer: Self-pay | Admitting: Oncology

## 2014-12-02 ENCOUNTER — Other Ambulatory Visit: Payer: Self-pay

## 2014-12-02 ENCOUNTER — Encounter (HOSPITAL_COMMUNITY): Payer: Commercial Managed Care - HMO | Attending: Hematology & Oncology | Admitting: Oncology

## 2014-12-02 VITALS — BP 156/83 | HR 65 | Temp 98.5°F | Resp 16 | Wt 175.3 lb

## 2014-12-02 DIAGNOSIS — C9210 Chronic myeloid leukemia, BCR/ABL-positive, not having achieved remission: Secondary | ICD-10-CM

## 2014-12-02 DIAGNOSIS — D72829 Elevated white blood cell count, unspecified: Secondary | ICD-10-CM | POA: Diagnosis not present

## 2014-12-02 DIAGNOSIS — R634 Abnormal weight loss: Secondary | ICD-10-CM

## 2014-12-02 DIAGNOSIS — L659 Nonscarring hair loss, unspecified: Secondary | ICD-10-CM

## 2014-12-02 LAB — CBC WITH DIFFERENTIAL/PLATELET
BASOS PCT: 1 %
Basophils Absolute: 0 10*3/uL (ref 0.0–0.1)
EOS ABS: 0.2 10*3/uL (ref 0.0–0.7)
EOS PCT: 3 %
HCT: 40.5 % (ref 36.0–46.0)
Hemoglobin: 13.3 g/dL (ref 12.0–15.0)
LYMPHS ABS: 2.3 10*3/uL (ref 0.7–4.0)
Lymphocytes Relative: 31 %
MCH: 27.9 pg (ref 26.0–34.0)
MCHC: 32.8 g/dL (ref 30.0–36.0)
MCV: 84.9 fL (ref 78.0–100.0)
Monocytes Absolute: 0.5 10*3/uL (ref 0.1–1.0)
Monocytes Relative: 7 %
NEUTROS PCT: 60 %
Neutro Abs: 4.5 10*3/uL (ref 1.7–7.7)
PLATELETS: 228 10*3/uL (ref 150–400)
RBC: 4.77 MIL/uL (ref 3.87–5.11)
RDW: 15.1 % (ref 11.5–15.5)
WBC: 7.6 10*3/uL (ref 4.0–10.5)

## 2014-12-02 LAB — COMPREHENSIVE METABOLIC PANEL
ALBUMIN: 4.3 g/dL (ref 3.5–5.0)
ALT: 26 U/L (ref 14–54)
AST: 25 U/L (ref 15–41)
Alkaline Phosphatase: 92 U/L (ref 38–126)
Anion gap: 8 (ref 5–15)
BUN: 11 mg/dL (ref 6–20)
CHLORIDE: 107 mmol/L (ref 101–111)
CO2: 24 mmol/L (ref 22–32)
Calcium: 9.4 mg/dL (ref 8.9–10.3)
Creatinine, Ser: 0.56 mg/dL (ref 0.44–1.00)
GFR calc non Af Amer: >60 mL/min (ref >60)
GLUCOSE: 221 mg/dL — AB (ref 65–99)
POTASSIUM: 3.4 mmol/L — AB (ref 3.5–5.1)
SODIUM: 139 mmol/L (ref 135–145)
Total Bilirubin: 1.6 mg/dL — ABNORMAL HIGH (ref 0.3–1.2)
Total Protein: 7.9 g/dL (ref 6.5–8.1)

## 2014-12-02 LAB — MAGNESIUM: Magnesium: 1.9 mg/dL (ref 1.7–2.4)

## 2014-12-02 NOTE — Assessment & Plan Note (Addendum)
CML having been followed by Dr. Tressie Stalker in past while taking Gleevac.  According to records, she failed Gleevac therapy after several years at which time she was switched to a newer TKI, but could not afford the co-pay.  Therefore, she stopped taking the medication on her own accord.  She is now on Tasigna beginning on 07/21/2014.  Labs in 4 weeks: CBC diff, CMET  Labs in 2 months: CBC diff, CMET, Mg  EKG today.  QTc is 456 ms.  EKG in 8 weeks.  Will add TSH and free T4 to labs today.  If unable to be added, patient agreeable to have labs checked next week.  Return in 4 weeks for follow-up.  Will need to monitor weight loss closely.

## 2014-12-02 NOTE — Progress Notes (Signed)
April Page., MD [place] [address]  Chronic myelogenous leukemia (CML), BCR-ABL1-positive - Plan: CBC with Differential, Comprehensive metabolic panel  Weight loss - Plan: TSH, T4, free  Hair loss - Plan: TSH, T4, free  CURRENT THERAPY: Tasigna 150 mg BID every 12 hours  INTERVAL HISTORY: April Page 69 y.o. female returns for followup of CML having been followed by Dr. Tressie Stalker in past while taking Gleevac.  According to records, she failed Gleevac therapy after several years at which time she was switched to a newer TKI, but could not afford the co-pay.  Therefore, she stopped taking the medication on her own accord.  She is now on Tasigna beginning on 07/21/2014.    Chronic myelogenous leukemia (CML), BCR-ABL1-positive   06/01/2014 Tumor Marker Results for RILYNNE, Page (MRN [id number]) as of 12/02/2014 09:30  06/01/2014 09:59 BCR ABL1 / ABL1 IS: 71.220 (H)    07/14/2014 Bone Marrow Biopsy Bone Marrow, Aspirate,Biopsy, and Clot, right iliac - HYPERCELLULAR BONE MARROW WITH CHRONIC MYELOGENOUS LEUKEMIA. - SEE COMMENT. PERIPHERAL BLOOD: - CHRONIC MYELOGENOUS LEUKEMIA. Diagnosis Note Previous PCR analysis performed for bcr-abl1 is report   07/21/2014 - 08/04/2014 Chemotherapy Tasigna 150 mg BID   08/04/2014 Miscellaneous Ran out of 2 week sample secondary to issues with insurance approval.   08/15/2014 -  Chemotherapy Tasigna 150 mg BID   09/17/2014 Tumor Marker b3a2 transcript % 4.122    I personally reviewed and went over laboratory results with the patient.  The results are noted within this dictation.   April Page notes some weight loss but reports that her appetite is strong.  She denies any nausea/vomiting.  She denies any changes in her diet.  She denies feeling poorly.  She also notes some thinning of her hair and hair loss.  Tasigna can cause alopecia up to 13%.  Lisinopril, which is another new medication for her can cause alopecia in ~ 1%.     She denies having her TSH checked lately.   She denies any B symptoms.  Past Medical History  Diagnosis Date  . GERD (gastroesophageal reflux disease)   . Hypertension   . Obesity   . Hyperlipidemia   . Pessary maintenance 07/01/2013  . H/O: CML (chronic myeloid leukemia)   . Cancer   . Blood dyscrasia   . Arthritis     stiff knees    has Pessary maintenance; Chronic diarrhea; Leukocytosis; Diabetes type 2, uncontrolled; Atypical chest pain; GERD (gastroesophageal reflux disease); Chronic myelogenous leukemia (CML), BCR-ABL1-positive; and Itching in the vaginal area on her problem list.     has No Known Allergies.  Current Outpatient Prescriptions on File Prior to Visit  Medication Sig Dispense Refill  . lactase (LACTAID) 3000 UNITS tablet Take 3,000 Units by mouth as needed (uses when eating dairy products).    Marland Kitchen lisinopril (PRINIVIL,ZESTRIL) 10 MG tablet Take 1 tablet (10 mg total) by mouth daily. 30 tablet 1  . Multiple Vitamin (MULTIVITAMIN WITH MINERALS) TABS tablet Take 1 tablet by mouth daily.    . naproxen sodium (ANAPROX) 220 MG tablet Take 220 mg by mouth 2 (two) times daily as needed (for pain).    . nilotinib (TASIGNA) 150 MG capsule Take 2 capsules (300 mg total) by mouth every 12 (twelve) hours. 120 capsule 6  . ondansetron (ZOFRAN) 8 MG tablet Take 1 tablet (8 mg total) by mouth every 8 (eight) hours as needed for nausea or vomiting. (Patient not taking: Reported on  10/05/2014) 30 tablet 1   No current facility-administered medications on file prior to visit.    Past Surgical History  Procedure Laterality Date  . Cholecystectomy    . Abdominal hysterectomy    . Hernia repair    . Oophorectomy    . Esophagogastroduodenoscopy  06/09/2002    IRW:ERXVQM upper gastrointestinal tract s/p  54-French Maloney dilator  . Colonoscopy  06/09/2002    GQQ:[id number] hemorrhoids; otherwise normal rectum, colon   . Cystocele/rectocele repair  2009  . Colonoscopy N/A  08/25/2013    OIZ:TIWPYKD diverticulosis. Single colonic polyp-removed  s/p segmental biopsy and stool sample. random colon bx negative. +benign leiomyoma.    Denies any headaches, dizziness, double vision, fevers, chills, night sweats, nausea, vomiting, diarrhea, constipation, chest pain, heart palpitations, shortness of breath, blood in stool, black tarry stool, urinary pain, urinary burning, urinary frequency, hematuria.   PHYSICAL EXAMINATION  ECOG PERFORMANCE STATUS: 1  Filed Vitals:   12/02/14 1412  BP: 156/83  Pulse: 65  Temp: 98.5 F (36.9 C)  Resp: 16    GENERAL:alert, no distress, well nourished, well developed, comfortable, cooperative, smiling and unaccompanied. SKIN: skin color, texture, turgor are normal, no rashes or significant lesions HEAD: Normocephalic, No masses, lesions, tenderness or abnormalities EYES: normal, PERRLA, EOMI, Conjunctiva are pink and non-injected EARS: External ears normal OROPHARYNX:lips, buccal mucosa, and tongue normal and mucous membranes are moist  NECK: supple, trachea midline LYMPH:  No adenopathy noted BREAST: No examined. LUNGS: clear to auscultation  HEART: regular rate & rhythm ABDOMEN:abdomen soft and normal bowel sounds BACK: Back symmetric, no curvature. EXTREMITIES:less then 2 second capillary refill, no skin discoloration, no cyanosis  NEURO: alert & oriented x 3 with fluent speech, no focal motor/sensory deficits, gait normal   LABORATORY DATA: CBC    Component Value Date/Time   WBC 7.6 12/02/2014 1323   RBC 4.77 12/02/2014 1323   HGB 13.3 12/02/2014 1323   HCT 40.5 12/02/2014 1323   PLT 228 12/02/2014 1323   MCV 84.9 12/02/2014 1323   MCH 27.9 12/02/2014 1323   MCHC 32.8 12/02/2014 1323   RDW 15.1 12/02/2014 1323   LYMPHSABS 2.3 12/02/2014 1323   MONOABS 0.5 12/02/2014 1323   EOSABS 0.2 12/02/2014 1323   BASOSABS 0.0 12/02/2014 1323      Chemistry      Component Value Date/Time   NA 139 12/02/2014 1323    K 3.4* 12/02/2014 1323   CL 107 12/02/2014 1323   CO2 24 12/02/2014 1323   BUN 11 12/02/2014 1323   CREATININE 0.56 12/02/2014 1323      Component Value Date/Time   CALCIUM 9.4 12/02/2014 1323   ALKPHOS 92 12/02/2014 1323   AST 25 12/02/2014 1323   ALT 26 12/02/2014 1323   BILITOT 1.6* 12/02/2014 1323       ASSESSMENT AND PLAN:  Chronic myelogenous leukemia (CML), BCR-ABL1-positive CML having been followed by Dr. Tressie Stalker in past while taking Gleevac.  According to records, she failed Gleevac therapy after several years at which time she was switched to a newer TKI, but could not afford the co-pay.  Therefore, she stopped taking the medication on her own accord.  She is now on Tasigna beginning on 07/21/2014.  Labs in 4 weeks: CBC diff, CMET  Labs in 2 months: CBC diff, CMET, Mg  EKG today.  QTc is 456 ms.  EKG in 8 weeks.  Will add TSH and free T4 to labs today.  If unable to be added, patient agreeable  to have labs checked next week.  Return in 4 weeks for follow-up.  Will need to monitor weight loss closely.   THERAPY PLAN:  Continue Tasigna as prescribed.    All questions were answered. The patient knows to call the clinic with any problems, questions or concerns. We can certainly see the patient much sooner if necessary.  Patient and plan discussed with Dr. Ancil Linsey and she is in agreement with the aforementioned.   This note is electronically signed by: Doy Mince 12/02/2014 3:04 PM

## 2014-12-02 NOTE — Progress Notes (Signed)
LABS DRAWN

## 2014-12-02 NOTE — Patient Instructions (Signed)
[place] at [facility] The Discharge Instructions  RECOMMENDATIONS MADE BY THE CONSULTANT AND ANY TEST RESULTS WILL BE SENT TO YOUR REFERRING PHYSICIAN.  Exam and discussion by Robynn Pane, PA No changes in therapy. Need to do some additional labs next week. Your EKG is okay.  Next one will be due in 2 months. Call with any concerns  Labs in 1 week, and in 1 month and office visit in 1 month.  Thank you for choosing [facility] to provide your oncology and hematology care.  To afford each patient quality time with our provider, please arrive at least 15 minutes before your scheduled appointment time.    You need to re-schedule your appointment should you arrive 10 or more minutes late.  We strive to give you quality time with our providers, and arriving late affects you and other patients whose appointments are after yours.  Also, if you no show three or more times for appointments you may be dismissed from the clinic at the providers discretion.     Again, thank you for choosing [facility].  Our hope is that these requests will decrease the amount of time that you wait before being seen by our physicians.       _____________________________________________________________  Should you have questions after your visit to [facility], please contact our office at [phone] between the hours of 8:30 a.m. and 4:30 p.m.  Voicemails left after 4:30 p.m. will not be returned until the following business day.  For prescription refill requests, have your pharmacy contact our office.

## 2014-12-06 ENCOUNTER — Other Ambulatory Visit (HOSPITAL_COMMUNITY): Payer: Self-pay | Admitting: Oncology

## 2014-12-06 ENCOUNTER — Encounter (HOSPITAL_BASED_OUTPATIENT_CLINIC_OR_DEPARTMENT_OTHER): Payer: Commercial Managed Care - HMO

## 2014-12-06 DIAGNOSIS — R634 Abnormal weight loss: Secondary | ICD-10-CM | POA: Diagnosis not present

## 2014-12-06 DIAGNOSIS — L659 Nonscarring hair loss, unspecified: Secondary | ICD-10-CM

## 2014-12-06 DIAGNOSIS — D72829 Elevated white blood cell count, unspecified: Secondary | ICD-10-CM | POA: Diagnosis not present

## 2014-12-06 DIAGNOSIS — E876 Hypokalemia: Secondary | ICD-10-CM

## 2014-12-06 LAB — TSH: TSH: 0.503 u[IU]/mL (ref 0.350–4.500)

## 2014-12-06 LAB — T4, FREE: Free T4: 0.92 ng/dL (ref 0.61–1.12)

## 2014-12-06 MED ORDER — POTASSIUM CHLORIDE CRYS ER 20 MEQ PO TBCR: 20.0000 meq | EXTENDED_RELEASE_TABLET | Freq: Once | ORAL | Status: DC

## 2014-12-07 LAB — BCR-ABL1, CML/ALL, PCR, QUANT: B3A2 TRANSCRIPT: 0.046 %

## 2014-12-07 NOTE — Progress Notes (Signed)
Labs drawn

## 2015-01-03 ENCOUNTER — Encounter (HOSPITAL_COMMUNITY): Payer: Self-pay | Admitting: Hematology & Oncology

## 2015-01-03 ENCOUNTER — Encounter (HOSPITAL_COMMUNITY): Payer: Commercial Managed Care - HMO | Attending: Hematology & Oncology | Admitting: Hematology & Oncology

## 2015-01-03 ENCOUNTER — Encounter (HOSPITAL_BASED_OUTPATIENT_CLINIC_OR_DEPARTMENT_OTHER): Payer: Commercial Managed Care - HMO

## 2015-01-03 ENCOUNTER — Other Ambulatory Visit: Payer: Self-pay

## 2015-01-03 VITALS — BP 166/83 | HR 72 | Temp 98.5°F | Resp 18 | Wt 174.2 lb

## 2015-01-03 DIAGNOSIS — C9210 Chronic myeloid leukemia, BCR/ABL-positive, not having achieved remission: Secondary | ICD-10-CM

## 2015-01-03 DIAGNOSIS — R9431 Abnormal electrocardiogram [ECG] [EKG]: Secondary | ICD-10-CM

## 2015-01-03 DIAGNOSIS — R634 Abnormal weight loss: Secondary | ICD-10-CM | POA: Diagnosis not present

## 2015-01-03 DIAGNOSIS — D72829 Elevated white blood cell count, unspecified: Secondary | ICD-10-CM | POA: Diagnosis not present

## 2015-01-03 LAB — COMPREHENSIVE METABOLIC PANEL
ALBUMIN: 4.2 g/dL (ref 3.5–5.0)
ALT: 21 U/L (ref 14–54)
AST: 22 U/L (ref 15–41)
Alkaline Phosphatase: 87 U/L (ref 38–126)
Anion gap: 9 (ref 5–15)
BUN: 9 mg/dL (ref 6–20)
CHLORIDE: 104 mmol/L (ref 101–111)
CO2: 27 mmol/L (ref 22–32)
Calcium: 9.6 mg/dL (ref 8.9–10.3)
Creatinine, Ser: 0.66 mg/dL (ref 0.44–1.00)
GFR calc Af Amer: >60 mL/min (ref >60)
GFR calc non Af Amer: >60 mL/min (ref >60)
GLUCOSE: 209 mg/dL — AB (ref 65–99)
POTASSIUM: 4 mmol/L (ref 3.5–5.1)
SODIUM: 140 mmol/L (ref 135–145)
Total Bilirubin: 1.4 mg/dL — ABNORMAL HIGH (ref 0.3–1.2)
Total Protein: 7.5 g/dL (ref 6.5–8.1)

## 2015-01-03 LAB — CBC WITH DIFFERENTIAL/PLATELET
Basophils Absolute: 0 10*3/uL (ref 0.0–0.1)
Basophils Relative: 0 %
EOS PCT: 2 %
Eosinophils Absolute: 0.1 10*3/uL (ref 0.0–0.7)
HCT: 40.4 % (ref 36.0–46.0)
HEMOGLOBIN: 13.2 g/dL (ref 12.0–15.0)
LYMPHS ABS: 2.4 10*3/uL (ref 0.7–4.0)
LYMPHS PCT: 34 %
MCH: 28.3 pg (ref 26.0–34.0)
MCHC: 32.7 g/dL (ref 30.0–36.0)
MCV: 86.5 fL (ref 78.0–100.0)
MONOS PCT: 6 %
Monocytes Absolute: 0.5 10*3/uL (ref 0.1–1.0)
NEUTROS PCT: 58 %
Neutro Abs: 4 10*3/uL (ref 1.7–7.7)
Platelets: 224 10*3/uL (ref 150–400)
RBC: 4.67 MIL/uL (ref 3.87–5.11)
RDW: 15.3 % (ref 11.5–15.5)
WBC: 7 10*3/uL (ref 4.0–10.5)

## 2015-01-03 NOTE — Progress Notes (Signed)
[facility] at [place] NOTE  Patient Care Team: Redmond School, MD as PCP - General (Internal Medicine)  CHIEF COMPLAINTS/PURPOSE OF CONSULTATION:  CML    Chronic myelogenous leukemia (CML), BCR-ABL1-positive ([place])   06/01/2014 Tumor Marker Results for JAYE, SAAL (MRN [id number]) as of 12/02/2014 09:30  06/01/2014 09:59 BCR ABL1 / ABL1 IS: 71.220 (H)    07/14/2014 Bone Marrow Biopsy Bone Marrow, Aspirate,Biopsy, and Clot, right iliac - HYPERCELLULAR BONE MARROW WITH CHRONIC MYELOGENOUS LEUKEMIA. - SEE COMMENT. PERIPHERAL BLOOD: - CHRONIC MYELOGENOUS LEUKEMIA. Diagnosis Note Previous PCR analysis performed for bcr-abl1 is report   07/21/2014 - 08/04/2014 Chemotherapy Tasigna 150 mg BID   08/04/2014 Miscellaneous Ran out of 2 week sample secondary to issues with insurance approval.   08/15/2014 -  Chemotherapy Tasigna 150 mg BID   09/17/2014 Tumor Marker b3a2 transcript % 4.122      HISTORY OF PRESENTING ILLNESS:  April Page 69 y.o. female is here because of CML. Interestingly she was seen and followed by Dr.Neijstrom for CML. Per records she was on [place] and it was felt after several years she had a Gleevec failure. She was then switched to newer generation TKI, she states she could not afford the co-pay and therefore simply stop taking the medication and coming to her hematologist.  The patient states she has been doing well on her medication.  She notes that she could use more energy by she has overall been fine.  She has been through a full prescription of potassium.  She reports that sometimes when she takes her medication she gets a "nasty taste" in her mouth.  She states that her daughters are concerned with her weight loss. She has lost approximately 18 pounds since January. Approximately 11 pounds since starting her therapy.   She reports that her appetite has changed and she gets full quick.  She has not had an EGD. C scope was in 2015 with Dr.  Sydell Axon.  She states that she has not taken a flu vaccination in many years.  MEDICAL HISTORY:  Past Medical History  Diagnosis Date  . GERD (gastroesophageal reflux disease)   . Hypertension   . Obesity   . Hyperlipidemia   . Pessary maintenance 07/01/2013  . H/O: CML (chronic myeloid leukemia)   . Cancer ([place])   . Blood dyscrasia   . Arthritis     stiff knees    SURGICAL HISTORY: Past Surgical History  Procedure Laterality Date  . Cholecystectomy    . Abdominal hysterectomy    . Hernia repair    . Oophorectomy    . Esophagogastroduodenoscopy  06/09/2002    WKM:QKMMNO upper gastrointestinal tract s/p  54-French Maloney dilator  . Colonoscopy  06/09/2002    TRR:NHAFBXUX hemorrhoids; otherwise normal rectum, colon   . Cystocele/rectocele repair  2009  . Colonoscopy N/A 08/25/2013    YBF:XOVANVB diverticulosis. Single colonic polyp-removed  s/p segmental biopsy and stool sample. random colon bx negative. +benign leiomyoma.    SOCIAL HISTORY: Social History   Social History  . Marital Status: Widowed    Spouse Name: N/A  . Number of Children: 6  . Years of Education: associate   Occupational History  .     Social History Main Topics  . Smoking status: Never Smoker   . Smokeless tobacco: Never Used  . Alcohol Use: No  . Drug Use: No  . Sexual Activity: Not Currently    Birth Control/ Protection: Surgical  Comment: hyst   Other Topics Concern  . Not on file   Social History Narrative    FAMILY HISTORY: Family History  Problem Relation Age of Onset  . Heart disease Child 39    cardiac arrest  . Anuerysm Mother     deceased age 2, brain anuerysm  . Early death Mother 101  . Heart disease Father   . Hypertension Sister   . Obesity Brother   . Hypertension Sister   . Arthritis Sister   . Colon cancer Neg Hx    indicated that her mother is deceased. She indicated that her father is deceased. She indicated that all of her three sisters are alive. She  indicated that her brother is alive. She indicated that her maternal grandmother is deceased. She indicated that her maternal grandfather is deceased. She indicated that her paternal grandmother is deceased. She indicated that her paternal grandfather is deceased. She indicated that all of her four daughters are alive. She indicated that only one of her two sons is alive. She indicated that her child is deceased.   ALLERGIES:  has No Known Allergies.  MEDICATIONS:  Current Outpatient Prescriptions  Medication Sig Dispense Refill  . lactase (LACTAID) 3000 UNITS tablet Take 3,000 Units by mouth as needed (uses when eating dairy products).    Marland Kitchen lisinopril (PRINIVIL,ZESTRIL) 10 MG tablet Take 1 tablet (10 mg total) by mouth daily. 30 tablet 1  . Multiple Vitamin (MULTIVITAMIN WITH MINERALS) TABS tablet Take 1 tablet by mouth daily.    . naproxen sodium (ANAPROX) 220 MG tablet Take 220 mg by mouth 2 (two) times daily as needed (for pain).    . nilotinib (TASIGNA) 150 MG capsule Take 2 capsules (300 mg total) by mouth every 12 (twelve) hours. 120 capsule 6  . ondansetron (ZOFRAN) 8 MG tablet Take 1 tablet (8 mg total) by mouth every 8 (eight) hours as needed for nausea or vomiting. (Patient not taking: Reported on 10/05/2014) 30 tablet 1  . potassium chloride SA (K-DUR,KLOR-CON) 20 MEQ tablet Take 1 tablet (20 mEq total) by mouth once. (Patient not taking: Reported on 01/03/2015) 15 tablet 0   No current facility-administered medications for this visit.    Review of Systems  Constitutional: Positive for malaise/fatigue. Negative for fever, chills and weight loss.  HENT: Negative for congestion, hearing loss, nosebleeds, sore throat and tinnitus.   Eyes: Negative for blurred vision, double vision, pain and discharge.  Respiratory: Negative for cough, hemoptysis, sputum production, shortness of breath and wheezing.   Cardiovascular: Negative for chest pain, palpitations, claudication, leg swelling and  PND.  Gastrointestinal: Negative for heartburn, nausea, vomiting, abdominal pain, diarrhea, constipation, blood in stool and melena.  Genitourinary: Negative for dysuria, urgency, frequency and hematuria.  Musculoskeletal: Negative for myalgias, joint pain and falls.  Skin: Negative for itching and rash.  Neurological: Negative for dizziness, tingling, tremors, sensory change, speech change, focal weakness, seizures, loss of consciousness and headaches.  Endo/Heme/Allergies: Does not bruise/bleed easily.  Psychiatric/Behavioral: Negative for depression, suicidal ideas, memory loss and substance abuse. The patient is not nervous/anxious and does not have insomnia.    14 point ROS was done and is otherwise as detailed above or in HPI   PHYSICAL EXAMINATION: ECOG PERFORMANCE STATUS: 1 - Symptomatic but completely ambulatory  Filed Vitals:   01/03/15 1000  BP: 166/83  Pulse: 72  Temp: 98.5 F (36.9 C)  Resp: 18   Filed Weights   01/03/15 1000  Weight: 174 lb 3.2 oz (  79.017 kg)     Physical Exam  Constitutional: She is oriented to person, place, and time and well-developed, well-nourished, and in no distress.  HENT:  Head: Normocephalic and atraumatic.  Nose: Nose normal.  Mouth/Throat: Oropharynx is clear and moist. No oropharyngeal exudate.  Eyes: Conjunctivae and EOM are normal. Pupils are equal, round, and reactive to light. Right eye exhibits no discharge. Left eye exhibits no discharge. No scleral icterus.  Neck: Normal range of motion. Neck supple. No tracheal deviation present. No thyromegaly present.  Cardiovascular: Normal rate, regular rhythm and normal heart sounds.  Exam reveals no gallop and no friction rub.   No murmur heard. Pulmonary/Chest: Effort normal and breath sounds normal. She has no wheezes. She has no rales.  Abdominal: Soft. Bowel sounds are normal. She exhibits no distension and no mass. There is no tenderness. There is no rebound and no guarding.   Musculoskeletal: Normal range of motion. She exhibits no edema.  Lymphadenopathy:    She has no cervical adenopathy.  Neurological: She is alert and oriented to person, place, and time. She has normal reflexes. No cranial nerve deficit. Gait normal. Coordination normal.  Skin: Skin is warm and dry. No rash noted.  Psychiatric: Mood, memory, affect and judgment normal.  Nursing note and vitals reviewed.   LABORATORY DATA:  I have reviewed the data as listed  Bone Marrow, Aspirate,Biopsy, and Clot, right iliac - HYPERCELLULAR BONE MARROW WITH CHRONIC MYELOGENOUS LEUKEMIA. - SEE COMMENT. PERIPHERAL BLOOD: - CHRONIC MYELOGENOUS LEUKEMIA. 3% Blasts   Diagnosis Note Previous PCR analysis performed for bcr-abl1 is reportedly positive. Given the latter and the overall morphologic changes in the bone nmarrow, the findings are consistent with chronic myelogenous leukemia. Clinical correlation is Recommended.  ASSESSMENT & PLAN:  CML originally diagnosed in 2010 Off therapy for several years BMBX on 07/14/2014 with chronic phase CML History of Gleevec failure Tasigna 152m po bid Weight loss  She is taking tasigna regularly. She denies missing any doses. EKG is acceptable.  The patient notes that she has been experiencing weight loss and a change in appetite. She has lost approximately 11 pounds since starting therapy. She is unable to be prescribed a PPI or H2 blocker secondary to drug interactions with her tasigna.  If this continues as an ongoing issue, I will have to consider changing her medication.  She will not personally stay on the medication if her problems persist, she has admitted to this today. She is currently adamant however that she has been compliant, laboratory studies including BCR-ABL certainly confirm this.   She will return in one month for ongoing assessment.  The patient has refused her flu shot.  All questions were answered. The patient knows to call the clinic  with any problems, questions or concerns. This note was electronically signed.    This document serves as a record of services personally performed by SAncil Linsey MD. It was created on her behalf by DJanace Hoard a trained medical scribe. The creation of this record is based on the scribe's personal observations and the provider's statements to them. This document has been checked and approved by the attending provider.    I have reviewed the above documentation for accuracy and completeness, and I agree with the above.  SKelby Fam PWhitney Muse MD

## 2015-01-03 NOTE — Patient Instructions (Signed)
[place] at [facility] Discharge Instructions  RECOMMENDATIONS MADE BY THE CONSULTANT AND ANY TEST RESULTS WILL BE SENT TO YOUR REFERRING PHYSICIAN.  Exam completed by Dr Whitney Muse today EKG today Take tums 30 minutes before medications Return to see the doctor in 1 month with labs Please call the clinic if you have any questions or concerns  Thank you for choosing [place] at [facility] to provide your oncology and hematology care.  To afford each patient quality time with our provider, please arrive at least 15 minutes before your scheduled appointment time.    You need to re-schedule your appointment should you arrive 10 or more minutes late.  We strive to give you quality time with our providers, and arriving late affects you and other patients whose appointments are after yours.  Also, if you no show three or more times for appointments you may be dismissed from the clinic at the providers discretion.     Again, thank you for choosing [facility].  Our hope is that these requests will decrease the amount of time that you wait before being seen by our physicians.       _____________________________________________________________  Should you have questions after your visit to [facility], please contact our office at [phone] between the hours of 8:30 a.m. and 4:30 p.m.  Voicemails left after 4:30 p.m. will not be returned until the following business day.  For prescription refill requests, have your pharmacy contact our office.

## 2015-01-03 NOTE — Progress Notes (Signed)
..  [name]'s reason for visit today are for labs as scheduled per MD orders.  Venipuncture performed with a 23 gauge butterfly needle to L Antecubital.  April Page tolerated venipuncture well and without incident; questions were answered and patient was discharged.

## 2015-01-17 ENCOUNTER — Other Ambulatory Visit (HOSPITAL_COMMUNITY): Payer: Self-pay

## 2015-01-17 DIAGNOSIS — I10 Essential (primary) hypertension: Secondary | ICD-10-CM

## 2015-01-17 MED ORDER — LISINOPRIL 10 MG PO TABS: 10.0000 mg | ORAL_TABLET | Freq: Every day | ORAL | Status: DC

## 2015-02-01 ENCOUNTER — Other Ambulatory Visit (HOSPITAL_COMMUNITY): Payer: Self-pay

## 2015-02-05 NOTE — Assessment & Plan Note (Addendum)
CML having been followed by Dr. Tressie Stalker in past while taking Gleevac.  According to records, she failed Gleevac therapy after several years at which time she was switched to a newer TKI, but could not afford the co-pay.  Therefore, she stopped taking the medication on her own accord.  She is on Tasigna beginning on 07/21/2014.  Labs today as ordered: CBC diff, CMET, Mg, Phosphorus.  Labs in 2-3 weeks: CBC diff, CMET, Phosphoris, Mg, B12, Folate, TSH, Vit D, and Hgb A1C.  Current complaints: fatigue, alopecia, and peripheral neuropathy symptoms (grade 1).  Will need to follow.  Weight loss is of concern.  Return in 2-3 weeks for follow-up.

## 2015-02-07 ENCOUNTER — Ambulatory Visit (HOSPITAL_COMMUNITY): Payer: Commercial Managed Care - HMO | Admitting: Hematology & Oncology

## 2015-02-07 ENCOUNTER — Encounter (HOSPITAL_COMMUNITY): Payer: Self-pay | Admitting: Oncology

## 2015-02-07 ENCOUNTER — Encounter (HOSPITAL_BASED_OUTPATIENT_CLINIC_OR_DEPARTMENT_OTHER): Payer: Commercial Managed Care - HMO | Admitting: Oncology

## 2015-02-07 ENCOUNTER — Encounter (HOSPITAL_COMMUNITY): Payer: Commercial Managed Care - HMO | Attending: Hematology & Oncology

## 2015-02-07 VITALS — BP 170/80 | HR 68 | Temp 98.7°F | Resp 16 | Wt 173.0 lb

## 2015-02-07 DIAGNOSIS — R202 Paresthesia of skin: Secondary | ICD-10-CM | POA: Diagnosis not present

## 2015-02-07 DIAGNOSIS — C9210 Chronic myeloid leukemia, BCR/ABL-positive, not having achieved remission: Secondary | ICD-10-CM

## 2015-02-07 DIAGNOSIS — R9431 Abnormal electrocardiogram [ECG] [EKG]: Secondary | ICD-10-CM

## 2015-02-07 DIAGNOSIS — R634 Abnormal weight loss: Secondary | ICD-10-CM

## 2015-02-07 DIAGNOSIS — R5383 Other fatigue: Secondary | ICD-10-CM | POA: Diagnosis not present

## 2015-02-07 DIAGNOSIS — D72829 Elevated white blood cell count, unspecified: Secondary | ICD-10-CM | POA: Diagnosis not present

## 2015-02-07 DIAGNOSIS — G6289 Other specified polyneuropathies: Secondary | ICD-10-CM

## 2015-02-07 DIAGNOSIS — Z79899 Other long term (current) drug therapy: Secondary | ICD-10-CM

## 2015-02-07 LAB — CBC WITH DIFFERENTIAL/PLATELET
Basophils Absolute: 0 10*3/uL (ref 0.0–0.1)
Basophils Relative: 0 %
EOS PCT: 3 %
Eosinophils Absolute: 0.3 10*3/uL (ref 0.0–0.7)
HEMATOCRIT: 40.2 % (ref 36.0–46.0)
HEMOGLOBIN: 13.3 g/dL (ref 12.0–15.0)
LYMPHS ABS: 2.5 10*3/uL (ref 0.7–4.0)
LYMPHS PCT: 34 %
MCH: 28.5 pg (ref 26.0–34.0)
MCHC: 33.1 g/dL (ref 30.0–36.0)
MCV: 86.1 fL (ref 78.0–100.0)
Monocytes Absolute: 0.5 10*3/uL (ref 0.1–1.0)
Monocytes Relative: 7 %
Neutro Abs: 4.2 10*3/uL (ref 1.7–7.7)
Neutrophils Relative %: 56 %
PLATELETS: 229 10*3/uL (ref 150–400)
RBC: 4.67 MIL/uL (ref 3.87–5.11)
RDW: 15 % (ref 11.5–15.5)
WBC: 7.5 10*3/uL (ref 4.0–10.5)

## 2015-02-07 LAB — COMPREHENSIVE METABOLIC PANEL
ALK PHOS: 78 U/L (ref 38–126)
ALT: 25 U/L (ref 14–54)
AST: 23 U/L (ref 15–41)
Albumin: 4.2 g/dL (ref 3.5–5.0)
Anion gap: 9 (ref 5–15)
BILIRUBIN TOTAL: 1 mg/dL (ref 0.3–1.2)
BUN: 9 mg/dL (ref 6–20)
CO2: 26 mmol/L (ref 22–32)
CREATININE: 0.58 mg/dL (ref 0.44–1.00)
Calcium: 9.4 mg/dL (ref 8.9–10.3)
Chloride: 106 mmol/L (ref 101–111)
Glucose, Bld: 159 mg/dL — ABNORMAL HIGH (ref 65–99)
Potassium: 3.7 mmol/L (ref 3.5–5.1)
Sodium: 141 mmol/L (ref 135–145)
Total Protein: 7.6 g/dL (ref 6.5–8.1)

## 2015-02-07 LAB — MAGNESIUM: MAGNESIUM: 2 mg/dL (ref 1.7–2.4)

## 2015-02-07 LAB — PHOSPHORUS: PHOSPHORUS: 2.9 mg/dL (ref 2.5–4.6)

## 2015-02-07 MED ORDER — NILOTINIB HCL 150 MG PO CAPS: 300.0000 mg | ORAL_CAPSULE | Freq: Two times a day (BID) | ORAL | Status: DC

## 2015-02-07 NOTE — Progress Notes (Signed)
LABS DRAWN

## 2015-02-07 NOTE — Progress Notes (Addendum)
[name], MD [place] [id number]  Chronic myeloid leukemia, BCR/ABL-positive, not having achieved remission ([place]) - Plan: CBC with Differential, Comprehensive metabolic panel, Phosphorus, Magnesium, nilotinib (TASIGNA) 150 MG capsule  Other fatigue - Plan: Folate, Vitamin B12, TSH, Vitamin D 25 hydroxy  Other polyneuropathy ([place]) - Plan: Folate, Vitamin B12, TSH, Vitamin D 25 hydroxy, Hemoglobin A1c  High risk medication use - Plan: nilotinib (TASIGNA) 150 MG capsule  CURRENT THERAPY: Tasigna 300 mg BID every 12 hours  INTERVAL HISTORY: April Page 69 y.o. female returns for followup of CML having been followed by Dr. Tressie Stalker in past while taking Gleevac.  According to records, she failed Gleevac therapy after several years at which time she was switched to a newer TKI, but could not afford the co-pay.  Therefore, she stopped taking the medication on her own accord.  She is now on Tasigna beginning on 07/21/2014.    Chronic myelogenous leukemia (CML), BCR-ABL1-positive ([place])   06/01/2014 Tumor Marker Results for DANNIELLE, BASKINS (MRN [id number]) as of 12/02/2014 09:30  06/01/2014 09:59 BCR ABL1 / ABL1 IS: 71.220 (H)    07/14/2014 Bone Marrow Biopsy Bone Marrow, Aspirate,Biopsy, and Clot, right iliac - HYPERCELLULAR BONE MARROW WITH CHRONIC MYELOGENOUS LEUKEMIA. - SEE COMMENT. PERIPHERAL BLOOD: - CHRONIC MYELOGENOUS LEUKEMIA. Diagnosis Note Previous PCR analysis performed for bcr-abl1 is report   07/21/2014 - 08/04/2014 Chemotherapy Tasigna 300 mg BID   08/04/2014 Miscellaneous Ran out of 2 week sample secondary to issues with insurance approval.   08/15/2014 -  Chemotherapy Tasigna 300 mg BID   09/17/2014 Tumor Marker b3a2 transcript % 4.122    12/02/2014 Tumor Marker b3a2 transcript: 0.046    I personally reviewed and went over laboratory results with the patient.  The results are noted within this dictation.   Alina continues to lose weight.  She is  down another 1.5 lbs.  She reports that her appetite is strong.  She notes 3 meals daily.  She denies any early satiety.  She denies any nausea or vomiting.  Her only complaint is fatigue.  She reports that her fatigue has not prevented her from doing the things she wants to do.  She reports a decrease in stamina, more than anything else.  She knows that she has to rest during some activities that she never had to do in the past.  She denies any chest pain.  She denies any dyspnea on exertion.   She notes continued thinning of her hair and loss of hair.    Interestingly, she notes minimal tingling in her fingertips.  She reports that it comes and goes.  Additionally, she notes some soles of feet sensations that come and go.  She reports that they feel "hot."  Peripheral neuropathy is not a common side effect of Tasigna.  She denies any falls.  She is able to button buttons and has not issues with fine motor skills.  Past Medical History  Diagnosis Date  . GERD (gastroesophageal reflux disease)   . Hypertension   . Obesity   . Hyperlipidemia   . Pessary maintenance 07/01/2013  . H/O: CML (chronic myeloid leukemia)   . Cancer ([place])   . Blood dyscrasia   . Arthritis     stiff knees    has Pessary maintenance; Chronic diarrhea; Leukocytosis; Diabetes type 2, uncontrolled ([place]); Atypical chest pain; GERD (gastroesophageal reflux disease); Chronic myelogenous leukemia (CML), BCR-ABL1-positive ([place]); and Itching in the vaginal area on  her problem list.     has No Known Allergies.  Current Outpatient Prescriptions on File Prior to Visit  Medication Sig Dispense Refill  . lactase (LACTAID) 3000 UNITS tablet Take 3,000 Units by mouth as needed (uses when eating dairy products).    Marland Kitchen lisinopril (PRINIVIL,ZESTRIL) 10 MG tablet Take 1 tablet (10 mg total) by mouth daily. 30 tablet 3  . Multiple Vitamin (MULTIVITAMIN WITH MINERALS) TABS tablet Take 1 tablet by mouth daily.    . naproxen sodium  (ANAPROX) 220 MG tablet Take 220 mg by mouth 2 (two) times daily as needed (for pain).    . ondansetron (ZOFRAN) 8 MG tablet Take 1 tablet (8 mg total) by mouth every 8 (eight) hours as needed for nausea or vomiting. (Patient not taking: Reported on 10/05/2014) 30 tablet 1  . potassium chloride SA (K-DUR,KLOR-CON) 20 MEQ tablet Take 1 tablet (20 mEq total) by mouth once. (Patient not taking: Reported on 01/03/2015) 15 tablet 0   No current facility-administered medications on file prior to visit.    Past Surgical History  Procedure Laterality Date  . Cholecystectomy    . Abdominal hysterectomy    . Hernia repair    . Oophorectomy    . Esophagogastroduodenoscopy  06/09/2002    DHR:CBULAG upper gastrointestinal tract s/p  54-French Maloney dilator  . Colonoscopy  06/09/2002    TXM:IWOEHOZY hemorrhoids; otherwise normal rectum, colon   . Cystocele/rectocele repair  2009  . Colonoscopy N/A 08/25/2013    YQM:GNOIBBC diverticulosis. Single colonic polyp-removed  s/p segmental biopsy and stool sample. random colon bx negative. +benign leiomyoma.    Denies any headaches, dizziness, double vision, fevers, chills, night sweats, nausea, vomiting, diarrhea, constipation, chest pain, heart palpitations, shortness of breath, blood in stool, black tarry stool, urinary pain, urinary burning, urinary frequency, hematuria.   PHYSICAL EXAMINATION  ECOG PERFORMANCE STATUS: 1  Filed Vitals:   02/07/15 0946 02/07/15 1053  BP: 183/93 170/80  Pulse: 77 68  Temp: 98.7 F (37.1 C)   Resp: 16     GENERAL:alert, no distress, well nourished, well developed, comfortable, cooperative, smiling and unaccompanied. SKIN: skin color, texture, turgor are normal, no rashes or significant lesions HEAD: Normocephalic, No masses, lesions, tenderness or abnormalities EYES: normal, PERRLA, EOMI, Conjunctiva are pink and non-injected EARS: External ears normal OROPHARYNX:lips, buccal mucosa, and tongue normal and mucous  membranes are moist  NECK: supple, trachea midline LYMPH:  No adenopathy noted BREAST: No examined. LUNGS: clear to auscultation  HEART: regular rate & rhythm ABDOMEN:abdomen soft and normal bowel sounds BACK: Back symmetric, no curvature. EXTREMITIES:less then 2 second capillary refill, no skin discoloration, no cyanosis  NEURO: alert & oriented x 3 with fluent speech, no focal motor/sensory deficits, gait normal   LABORATORY DATA: CBC    Component Value Date/Time   WBC 7.5 02/07/2015 0951   RBC 4.67 02/07/2015 0951   HGB 13.3 02/07/2015 0951   HCT 40.2 02/07/2015 0951   PLT 229 02/07/2015 0951   MCV 86.1 02/07/2015 0951   MCH 28.5 02/07/2015 0951   MCHC 33.1 02/07/2015 0951   RDW 15.0 02/07/2015 0951   LYMPHSABS 2.5 02/07/2015 0951   MONOABS 0.5 02/07/2015 0951   EOSABS 0.3 02/07/2015 0951   BASOSABS 0.0 02/07/2015 0951      Chemistry      Component Value Date/Time   NA 141 02/07/2015 0951   K 3.7 02/07/2015 0951   CL 106 02/07/2015 0951   CO2 26 02/07/2015 0951   BUN 9  02/07/2015 0951   CREATININE 0.58 02/07/2015 0951      Component Value Date/Time   CALCIUM 9.4 02/07/2015 0951   ALKPHOS 78 02/07/2015 0951   AST 23 02/07/2015 0951   ALT 25 02/07/2015 0951   BILITOT 1.0 02/07/2015 0951       ASSESSMENT AND PLAN:  Chronic myelogenous leukemia (CML), BCR-ABL1-positive CML having been followed by Dr. Tressie Stalker in past while taking Gleevac.  According to records, she failed Gleevac therapy after several years at which time she was switched to a newer TKI, but could not afford the co-pay.  Therefore, she stopped taking the medication on her own accord.  She is on Tasigna beginning on 07/21/2014.  Labs today as ordered: CBC diff, CMET, Mg, Phosphorus.  Labs in 2-3 weeks: CBC diff, CMET, Phosphoris, Mg, B12, Folate, TSH, Vit D, and Hgb A1C.  Current complaints: fatigue, alopecia, and peripheral neuropathy symptoms (grade 1).  Will need to follow.  Weight loss is  of concern.  Return in 2-3 weeks for follow-up.     THERAPY PLAN:  Continue Tasigna as prescribed.  Given her weight loss and side effects of therapy, we may need to consider referral to [facility] for a second opinion.  If we were to switch her to [place], she would not have any further treatment options to date at time of failure.  Tasigna is currently working with declining BCR/ABL.  However, QOL is very important for Khayla, and rightfully so.  We need to treat the patient, not the numbers.  She is agreeable to continue with plan outlined above.  All questions were answered. The patient knows to call the clinic with any problems, questions or concerns. We can certainly see the patient much sooner if necessary.  Patient and plan discussed with Dr. Ancil Linsey and she is in agreement with the aforementioned.   This note is electronically signed by: Doy Mince 02/14/2015 1:24 PM

## 2015-02-07 NOTE — Patient Instructions (Signed)
[place] at [facility] Discharge Instructions  RECOMMENDATIONS MADE BY THE CONSULTANT AND ANY TEST RESULTS WILL BE SENT TO YOUR REFERRING PHYSICIAN.  Exam and discussion by Robynn Pane, PA-C Continue Tasigna Will check some additional labs on your next visit to evaluate your fatigue and some of the other symptoms that you are experiencing.  Follow-up in 2 - 3 weeks with labs and office visit.  Thank you for choosing [place] at [facility] to provide your oncology and hematology care.  To afford each patient quality time with our provider, please arrive at least 15 minutes before your scheduled appointment time.    You need to re-schedule your appointment should you arrive 10 or more minutes late.  We strive to give you quality time with our providers, and arriving late affects you and other patients whose appointments are after yours.  Also, if you no show three or more times for appointments you may be dismissed from the clinic at the providers discretion.     Again, thank you for choosing [facility].  Our hope is that these requests will decrease the amount of time that you wait before being seen by our physicians.       _____________________________________________________________  Should you have questions after your visit to [facility], please contact our office at [phone] between the hours of 8:30 a.m. and 4:30 p.m.  Voicemails left after 4:30 p.m. will not be returned until the following business day.  For prescription refill requests, have your pharmacy contact our office.

## 2015-02-14 ENCOUNTER — Ambulatory Visit: Payer: Commercial Managed Care - HMO | Admitting: Adult Health

## 2015-02-23 ENCOUNTER — Other Ambulatory Visit (HOSPITAL_COMMUNITY): Payer: Commercial Managed Care - HMO

## 2015-02-23 ENCOUNTER — Ambulatory Visit (HOSPITAL_COMMUNITY): Payer: Commercial Managed Care - HMO | Admitting: Oncology

## 2015-02-23 NOTE — Progress Notes (Signed)
Rescheduled

## 2015-02-23 NOTE — Assessment & Plan Note (Deleted)
CML having been followed by Dr. Tressie Stalker in past while taking Gleevac.  According to records, she failed Gleevac therapy after several years at which time she was switched to a newer TKI, but could not afford the co-pay.  Therefore, she stopped taking the medication on her own accord.  She is on Tasigna beginning on 07/21/2014.  Labs today: CBC diff, CMET, Phosphoris, Mg, B12, Folate, TSH, Vit D, and Hgb A1C.  Current complaints: fatigue, alopecia, and peripheral neuropathy symptoms (grade 1).  Will need to follow.  Weight loss is of concern.  Return in 2-3 weeks for follow-up.

## 2015-02-24 ENCOUNTER — Other Ambulatory Visit: Payer: Self-pay

## 2015-02-24 ENCOUNTER — Encounter (HOSPITAL_COMMUNITY): Payer: Commercial Managed Care - HMO | Attending: Hematology & Oncology | Admitting: Oncology

## 2015-02-24 ENCOUNTER — Encounter (HOSPITAL_COMMUNITY): Payer: Commercial Managed Care - HMO

## 2015-02-24 ENCOUNTER — Encounter (HOSPITAL_COMMUNITY): Payer: Self-pay | Admitting: Oncology

## 2015-02-24 VITALS — BP 179/75 | HR 70 | Temp 98.1°F | Resp 18 | Wt 174.9 lb

## 2015-02-24 DIAGNOSIS — D72829 Elevated white blood cell count, unspecified: Secondary | ICD-10-CM | POA: Insufficient documentation

## 2015-02-24 DIAGNOSIS — C9210 Chronic myeloid leukemia, BCR/ABL-positive, not having achieved remission: Secondary | ICD-10-CM

## 2015-02-24 DIAGNOSIS — G6289 Other specified polyneuropathies: Secondary | ICD-10-CM

## 2015-02-24 DIAGNOSIS — R5383 Other fatigue: Secondary | ICD-10-CM

## 2015-02-24 LAB — VITAMIN B12: Vitamin B-12: 869 pg/mL (ref 180–914)

## 2015-02-24 LAB — PHOSPHORUS: PHOSPHORUS: 3.6 mg/dL (ref 2.5–4.6)

## 2015-02-24 LAB — COMPREHENSIVE METABOLIC PANEL
ALBUMIN: 4.3 g/dL (ref 3.5–5.0)
ALT: 19 U/L (ref 14–54)
AST: 23 U/L (ref 15–41)
Alkaline Phosphatase: 94 U/L (ref 38–126)
Anion gap: 9 (ref 5–15)
BUN: 9 mg/dL (ref 6–20)
CHLORIDE: 103 mmol/L (ref 101–111)
CO2: 28 mmol/L (ref 22–32)
Calcium: 9.6 mg/dL (ref 8.9–10.3)
Creatinine, Ser: 0.68 mg/dL (ref 0.44–1.00)
GFR calc Af Amer: >60 mL/min (ref >60)
GLUCOSE: 250 mg/dL — AB (ref 65–99)
POTASSIUM: 3.7 mmol/L (ref 3.5–5.1)
SODIUM: 140 mmol/L (ref 135–145)
Total Bilirubin: 1.5 mg/dL — ABNORMAL HIGH (ref 0.3–1.2)
Total Protein: 7.8 g/dL (ref 6.5–8.1)

## 2015-02-24 LAB — CBC WITH DIFFERENTIAL/PLATELET
BASOS ABS: 0 10*3/uL (ref 0.0–0.1)
BASOS PCT: 0 %
EOS ABS: 0.3 10*3/uL (ref 0.0–0.7)
EOS PCT: 4 %
HCT: 41.5 % (ref 36.0–46.0)
Hemoglobin: 13.6 g/dL (ref 12.0–15.0)
Lymphocytes Relative: 32 %
Lymphs Abs: 2.3 10*3/uL (ref 0.7–4.0)
MCH: 28.2 pg (ref 26.0–34.0)
MCHC: 32.8 g/dL (ref 30.0–36.0)
MCV: 86.1 fL (ref 78.0–100.0)
MONO ABS: 0.5 10*3/uL (ref 0.1–1.0)
Monocytes Relative: 8 %
Neutro Abs: 4 10*3/uL (ref 1.7–7.7)
Neutrophils Relative %: 56 %
PLATELETS: 236 10*3/uL (ref 150–400)
RBC: 4.82 MIL/uL (ref 3.87–5.11)
RDW: 15 % (ref 11.5–15.5)
WBC: 7.1 10*3/uL (ref 4.0–10.5)

## 2015-02-24 LAB — MAGNESIUM: MAGNESIUM: 2.1 mg/dL (ref 1.7–2.4)

## 2015-02-24 LAB — FOLATE: FOLATE: 39.6 ng/mL (ref >5.9)

## 2015-02-24 LAB — TSH: TSH: 0.671 u[IU]/mL (ref 0.350–4.500)

## 2015-02-24 NOTE — Patient Instructions (Addendum)
[place] at [facility] Discharge Instructions  RECOMMENDATIONS MADE BY THE CONSULTANT AND ANY TEST RESULTS WILL BE SENT TO YOUR REFERRING PHYSICIAN.  Exam and discussion by Robynn Pane, PA-C Will get EKG today - if any concerns we will contact you.  EKG is stable today. No changes in medication  Call with any concerns  Follow-up in 3 - 4 weeks with labs and office visit.  Thank you for choosing [place] at [facility] to provide your oncology and hematology care.  To afford each patient quality time with our provider, please arrive at least 15 minutes before your scheduled appointment time.    You need to re-schedule your appointment should you arrive 10 or more minutes late.  We strive to give you quality time with our providers, and arriving late affects you and other patients whose appointments are after yours.  Also, if you no show three or more times for appointments you may be dismissed from the clinic at the providers discretion.     Again, thank you for choosing [facility].  Our hope is that these requests will decrease the amount of time that you wait before being seen by our physicians.       _____________________________________________________________  Should you have questions after your visit to [facility], please contact our office at [phone] between the hours of 8:30 a.m. and 4:30 p.m.  Voicemails left after 4:30 p.m. will not be returned until the following business day.  For prescription refill requests, have your pharmacy contact our office.

## 2015-02-24 NOTE — Assessment & Plan Note (Addendum)
CML having been followed by Dr. Tressie Stalker in past while taking Gleevac.  According to records, she failed Gleevac therapy after several years at which time she was switched to a newer TKI, but could not afford the co-pay.  Therefore, she stopped taking the medication on her own accord.  She is on Tasigna beginning on 07/21/2014.  Labs today: CBC diff, CMET, Phosphoris, Mg, B12, Folate, TSH, Vit D, and Hgb A1C.  Current complaints: fatigue, alopecia, and peripheral neuropathy symptoms (grade 1).  Will need to follow.    Her weight is up 1.9 lbs compared to her weight 2 weeks ago.  She is encouraged to maintain her current weight.  EKG today.  Results demonstrate a NSR and QTc interval of 452 ms.  This stable compared to her past EKG results.  Labs in 3-4 weeks: CBC diff, CMET, BCR/ABL  Return in 3-4 weeks for follow-up.

## 2015-02-24 NOTE — Progress Notes (Signed)
[name], MD [place] [address]  Chronic myeloid leukemia, BCR/ABL-positive, not having achieved remission ([place]) - Plan: EKG 12-Lead, CBC with Differential, Comprehensive metabolic panel, BCR-ABL1, CML/ALL, PCR, QUANT  CURRENT THERAPY: Tasigna 300 mg BID every 12 hours  INTERVAL HISTORY: BECKEY POLKOWSKI 69 y.o. female returns for followup of CML having been followed by Dr. Tressie Stalker in past while taking Gleevac.  According to records, she failed Gleevac therapy after several years at which time she was switched to a newer TKI, but could not afford the co-pay.  Therefore, she stopped taking the medication on her own accord.  She is now on Tasigna beginning on 07/21/2014.    Chronic myelogenous leukemia (CML), BCR-ABL1-positive ([place])   06/01/2014 Tumor Marker Results for YONA, KOSEK (MRN [id number]) as of 12/02/2014 09:30  06/01/2014 09:59 BCR ABL1 / ABL1 IS: 71.220 (H)    07/14/2014 Bone Marrow Biopsy Bone Marrow, Aspirate,Biopsy, and Clot, right iliac - HYPERCELLULAR BONE MARROW WITH CHRONIC MYELOGENOUS LEUKEMIA. - SEE COMMENT. PERIPHERAL BLOOD: - CHRONIC MYELOGENOUS LEUKEMIA. Diagnosis Note Previous PCR analysis performed for bcr-abl1 is report   07/21/2014 - 08/04/2014 Chemotherapy Tasigna 300 mg BID   08/04/2014 Miscellaneous Ran out of 2 week sample secondary to issues with insurance approval.   08/15/2014 -  Chemotherapy Tasigna 300 mg BID   09/17/2014 Tumor Marker b3a2 transcript % 4.122    12/02/2014 Tumor Marker b3a2 transcript: 0.046     She returns for a short interval appointment due to our continued concerns regarding her weight loss.  I personally reviewed and went over laboratory results with the patient.  The results are noted within this dictation.   She continues with a good appetite.  Today, her weight is up 1.9 lbs compared to 2 weeks ago.  This is good as she has been losing weight over the past number of weeks.  She is encouraged to  continue to maintain her current weight.  She denies any nausea or vomiting.  She denies any early satiety  She continues to complain of fatigue and hair thinning.  I have added additional labs to her lap appointment today to further evaluate this.  "My beautician says that some people lose their hair because of their blood pressure medication."  Lisinopril has a >/= 1% risk of alopecia according to up-to-date.  We will rule out other causes first, and then consider a BP medication change.   Past Medical History  Diagnosis Date  . GERD (gastroesophageal reflux disease)   . Hypertension   . Obesity   . Hyperlipidemia   . Pessary maintenance 07/01/2013  . H/O: CML (chronic myeloid leukemia)   . Cancer ([place])   . Blood dyscrasia   . Arthritis     stiff knees    has Pessary maintenance; Chronic diarrhea; Leukocytosis; Diabetes type 2, uncontrolled ([place]); Atypical chest pain; GERD (gastroesophageal reflux disease); Chronic myelogenous leukemia (CML), BCR-ABL1-positive ([place]); and Itching in the vaginal area on her problem list.     has No Known Allergies.  Current Outpatient Prescriptions on File Prior to Visit  Medication Sig Dispense Refill  . Biotin 1000 MCG tablet Take 1,000 mcg by mouth daily.    Marland Kitchen lactase (LACTAID) 3000 UNITS tablet Take 3,000 Units by mouth as needed (uses when eating dairy products).    Marland Kitchen lisinopril (PRINIVIL,ZESTRIL) 10 MG tablet Take 1 tablet (10 mg total) by mouth daily. 30 tablet 3  . Multiple Vitamin (MULTIVITAMIN WITH MINERALS)  TABS tablet Take 1 tablet by mouth daily.    . naproxen sodium (ANAPROX) 220 MG tablet Take 220 mg by mouth 2 (two) times daily as needed (for pain).    . nilotinib (TASIGNA) 150 MG capsule Take 2 capsules (300 mg total) by mouth every 12 (twelve) hours. 120 capsule 6  . ondansetron (ZOFRAN) 8 MG tablet Take 1 tablet (8 mg total) by mouth every 8 (eight) hours as needed for nausea or vomiting. 30 tablet 1  . potassium chloride SA  (K-DUR,KLOR-CON) 20 MEQ tablet Take 1 tablet (20 mEq total) by mouth once. (Patient not taking: Reported on 01/03/2015) 15 tablet 0   No current facility-administered medications on file prior to visit.    Past Surgical History  Procedure Laterality Date  . Cholecystectomy    . Abdominal hysterectomy    . Hernia repair    . Oophorectomy    . Esophagogastroduodenoscopy  06/09/2002    VCB:SWHQPR upper gastrointestinal tract s/p  54-French Maloney dilator  . Colonoscopy  06/09/2002    FFM:BWGYKZLD hemorrhoids; otherwise normal rectum, colon   . Cystocele/rectocele repair  2009  . Colonoscopy N/A 08/25/2013    JTT:SVXBLTJ diverticulosis. Single colonic polyp-removed  s/p segmental biopsy and stool sample. random colon bx negative. +benign leiomyoma.    Denies any headaches, dizziness, double vision, fevers, chills, night sweats, nausea, vomiting, diarrhea, constipation, chest pain, heart palpitations, shortness of breath, blood in stool, black tarry stool, urinary pain, urinary burning, urinary frequency, hematuria.   PHYSICAL EXAMINATION  ECOG PERFORMANCE STATUS: 1  Filed Vitals:   02/24/15 1000  BP: 179/75  Pulse: 70  Temp: 98.1 F (36.7 C)  Resp: 18    GENERAL:alert, no distress, well nourished, well developed, comfortable, cooperative, smiling and unaccompanied. SKIN: skin color, texture, turgor are normal, no rashes or significant lesions HEAD: Normocephalic, No masses, lesions, tenderness or abnormalities EYES: normal, PERRLA, EOMI, Conjunctiva are pink and non-injected EARS: External ears normal OROPHARYNX:lips, buccal mucosa, and tongue normal and mucous membranes are moist  NECK: supple, trachea midline LYMPH:  No adenopathy noted BREAST: No examined. LUNGS: clear to auscultation bilaterally. HEART: regular rate & rhythm without murmur, rub or gallop.  Normal S1 and S2. ABDOMEN:abdomen soft and normal bowel sounds and without tenderness on deep palpation. BACK: Back  symmetric, no curvature. EXTREMITIES:less then 2 second capillary refill, no skin discoloration, no cyanosis  NEURO: alert & oriented x 3 with fluent speech, no focal motor/sensory deficits, gait normal   LABORATORY DATA: CBC    Component Value Date/Time   WBC 7.1 02/24/2015 1048   RBC 4.82 02/24/2015 1048   HGB 13.6 02/24/2015 1048   HCT 41.5 02/24/2015 1048   PLT 236 02/24/2015 1048   MCV 86.1 02/24/2015 1048   MCH 28.2 02/24/2015 1048   MCHC 32.8 02/24/2015 1048   RDW 15.0 02/24/2015 1048   LYMPHSABS 2.3 02/24/2015 1048   MONOABS 0.5 02/24/2015 1048   EOSABS 0.3 02/24/2015 1048   BASOSABS 0.0 02/24/2015 1048      Chemistry      Component Value Date/Time   NA 140 02/24/2015 1048   K 3.7 02/24/2015 1048   CL 103 02/24/2015 1048   CO2 28 02/24/2015 1048   BUN 9 02/24/2015 1048   CREATININE 0.68 02/24/2015 1048      Component Value Date/Time   CALCIUM 9.6 02/24/2015 1048   ALKPHOS 94 02/24/2015 1048   AST 23 02/24/2015 1048   ALT 19 02/24/2015 1048   BILITOT 1.5* 02/24/2015 1048  ASSESSMENT AND PLAN:  Chronic myelogenous leukemia (CML), BCR-ABL1-positive CML having been followed by Dr. Tressie Stalker in past while taking Gleevac.  According to records, she failed Gleevac therapy after several years at which time she was switched to a newer TKI, but could not afford the co-pay.  Therefore, she stopped taking the medication on her own accord.  She is on Tasigna beginning on 07/21/2014.  Labs today: CBC diff, CMET, Phosphoris, Mg, B12, Folate, TSH, Vit D, and Hgb A1C.  Current complaints: fatigue, alopecia, and peripheral neuropathy symptoms (grade 1).  Will need to follow.    Her weight is up 1.9 lbs compared to her weight 2 weeks ago.  She is encouraged to maintain her current weight.  EKG today.  Results demonstrate a NSR and QTc interval of 452 ms.  This stable compared to her past EKG results.  Labs in 3-4 weeks: CBC diff, CMET, BCR/ABL  Return in 3-4 weeks  for follow-up.     THERAPY PLAN:  Continue Tasigna as prescribed.    All questions were answered. The patient knows to call the clinic with any problems, questions or concerns. We can certainly see the patient much sooner if necessary.  Patient and plan discussed with Dr. Ancil Linsey and she is in agreement with the aforementioned.   This note is electronically signed by: Doy Mince 02/24/2015 12:11 PM

## 2015-02-25 LAB — HEMOGLOBIN A1C
HEMOGLOBIN A1C: 9.2 % — AB (ref 4.8–5.6)
Mean Plasma Glucose: 217 mg/dL

## 2015-02-25 LAB — VITAMIN D 25 HYDROXY (VIT D DEFICIENCY, FRACTURES): VIT D 25 HYDROXY: 28.8 ng/mL — AB (ref 30.0–100.0)

## 2015-02-27 ENCOUNTER — Other Ambulatory Visit (HOSPITAL_COMMUNITY): Payer: Self-pay | Admitting: Oncology

## 2015-02-27 DIAGNOSIS — E559 Vitamin D deficiency, unspecified: Secondary | ICD-10-CM

## 2015-02-27 MED ORDER — ERGOCALCIFEROL 1.25 MG (50000 UT) PO CAPS: 50000.0000 [IU] | ORAL_CAPSULE | ORAL | Status: DC

## 2015-03-18 NOTE — Assessment & Plan Note (Deleted)
CML having been followed by Dr. Tressie Stalker in past while taking Gleevac.  According to records, she failed Gleevac therapy after several years at which time she was switched to a newer TKI, but could not afford the co-pay.  Therefore, she stopped taking the medication on her own accord.  She is on Tasigna beginning on 07/21/2014.  Labs today: CBC diff, CMET, BCR/ABL  Current complaints: fatigue, alopecia, and peripheral neuropathy symptoms (grade 1).  Will need to follow.    Her weight is up 1.9 lbs compared to her weight 2 weeks ago.  She is encouraged to maintain her current weight.  EKG today.  Results demonstrate a NSR and QTc interval of 452 ms.  This stable compared to her past EKG results.  Return in 3-4 weeks for follow-up.

## 2015-03-18 NOTE — Progress Notes (Signed)
-  Rescheduled due to weather (snow)-

## 2015-03-20 ENCOUNTER — Ambulatory Visit (HOSPITAL_COMMUNITY): Payer: Commercial Managed Care - HMO | Admitting: Oncology

## 2015-03-20 ENCOUNTER — Other Ambulatory Visit (HOSPITAL_COMMUNITY): Payer: Commercial Managed Care - HMO

## 2015-03-31 ENCOUNTER — Ambulatory Visit (HOSPITAL_COMMUNITY)
Admission: RE | Admit: 2015-03-31 | Discharge: 2015-03-31 | Disposition: A | Discharge status: Home / Self Care | Payer: Commercial Managed Care - HMO | Source: Ambulatory Visit | Attending: Oncology | Admitting: Oncology

## 2015-03-31 ENCOUNTER — Encounter (HOSPITAL_COMMUNITY): Payer: Commercial Managed Care - HMO | Attending: Hematology & Oncology | Admitting: Oncology

## 2015-03-31 ENCOUNTER — Encounter (HOSPITAL_COMMUNITY): Payer: Commercial Managed Care - HMO

## 2015-03-31 ENCOUNTER — Other Ambulatory Visit (HOSPITAL_COMMUNITY): Payer: Self-pay | Admitting: Oncology

## 2015-03-31 VITALS — BP 179/88 | HR 79 | Temp 98.5°F | Resp 18 | Wt 174.5 lb

## 2015-03-31 DIAGNOSIS — C9210 Chronic myeloid leukemia, BCR/ABL-positive, not having achieved remission: Secondary | ICD-10-CM

## 2015-03-31 DIAGNOSIS — M546 Pain in thoracic spine: Secondary | ICD-10-CM | POA: Diagnosis not present

## 2015-03-31 DIAGNOSIS — M85812 Other specified disorders of bone density and structure, left shoulder: Secondary | ICD-10-CM | POA: Diagnosis not present

## 2015-03-31 DIAGNOSIS — M898X1 Other specified disorders of bone, shoulder: Secondary | ICD-10-CM | POA: Insufficient documentation

## 2015-03-31 DIAGNOSIS — M19012 Primary osteoarthritis, left shoulder: Secondary | ICD-10-CM | POA: Diagnosis not present

## 2015-03-31 DIAGNOSIS — M47814 Spondylosis without myelopathy or radiculopathy, thoracic region: Secondary | ICD-10-CM | POA: Diagnosis not present

## 2015-03-31 DIAGNOSIS — M19011 Primary osteoarthritis, right shoulder: Secondary | ICD-10-CM | POA: Diagnosis not present

## 2015-03-31 DIAGNOSIS — M8580 Other specified disorders of bone density and structure, unspecified site: Secondary | ICD-10-CM | POA: Insufficient documentation

## 2015-03-31 DIAGNOSIS — D72829 Elevated white blood cell count, unspecified: Secondary | ICD-10-CM | POA: Insufficient documentation

## 2015-03-31 LAB — CBC WITH DIFFERENTIAL/PLATELET
Basophils Absolute: 0 10*3/uL (ref 0.0–0.1)
Basophils Relative: 0 %
EOS ABS: 0.2 10*3/uL (ref 0.0–0.7)
EOS PCT: 2 %
HCT: 40.9 % (ref 36.0–46.0)
Hemoglobin: 13.3 g/dL (ref 12.0–15.0)
LYMPHS ABS: 3 10*3/uL (ref 0.7–4.0)
LYMPHS PCT: 34 %
MCH: 27.9 pg (ref 26.0–34.0)
MCHC: 32.5 g/dL (ref 30.0–36.0)
MCV: 85.7 fL (ref 78.0–100.0)
MONO ABS: 0.7 10*3/uL (ref 0.1–1.0)
Monocytes Relative: 8 %
Neutro Abs: 5.1 10*3/uL (ref 1.7–7.7)
Neutrophils Relative %: 56 %
PLATELETS: 249 10*3/uL (ref 150–400)
RBC: 4.77 MIL/uL (ref 3.87–5.11)
RDW: 14.7 % (ref 11.5–15.5)
WBC: 9.1 10*3/uL (ref 4.0–10.5)

## 2015-03-31 LAB — COMPREHENSIVE METABOLIC PANEL
ALT: 21 U/L (ref 14–54)
ANION GAP: 9 (ref 5–15)
AST: 20 U/L (ref 15–41)
Albumin: 4.2 g/dL (ref 3.5–5.0)
Alkaline Phosphatase: 89 U/L (ref 38–126)
BUN: 8 mg/dL (ref 6–20)
CHLORIDE: 106 mmol/L (ref 101–111)
CO2: 28 mmol/L (ref 22–32)
Calcium: 9.8 mg/dL (ref 8.9–10.3)
Creatinine, Ser: 0.61 mg/dL (ref 0.44–1.00)
Glucose, Bld: 130 mg/dL — ABNORMAL HIGH (ref 65–99)
POTASSIUM: 3.5 mmol/L (ref 3.5–5.1)
SODIUM: 143 mmol/L (ref 135–145)
Total Bilirubin: 1.1 mg/dL (ref 0.3–1.2)
Total Protein: 7.5 g/dL (ref 6.5–8.1)

## 2015-03-31 NOTE — Patient Instructions (Signed)
..  [place] at [facility] Discharge Instructions  RECOMMENDATIONS MADE BY THE CONSULTANT AND ANY TEST RESULTS WILL BE SENT TO YOUR REFERRING PHYSICIAN.  Labs were done today We will repeat in 3-4 weeks with visit with Dr. Shari Prows today   Thank you for choosing [place] at [facility] to provide your oncology and hematology care.  To afford each patient quality time with our provider, please arrive at least 15 minutes before your scheduled appointment time.   Beginning January 23rd 2017 lab work for the [facility] will be done in the  Main lab at [facility] on 1st floor. If you have a lab appointment with the [facility] please come in thru the  Main Entrance and check in at the main information desk  You need to re-schedule your appointment should you arrive 10 or more minutes late.  We strive to give you quality time with our providers, and arriving late affects you and other patients whose appointments are after yours.  Also, if you no show three or more times for appointments you may be dismissed from the clinic at the providers discretion.     Again, thank you for choosing [facility].  Our hope is that these requests will decrease the amount of time that you wait before being seen by our physicians.       _____________________________________________________________  Should you have questions after your visit to [facility], please contact our office at [phone] between the hours of 8:30 a.m. and 4:30 p.m.  Voicemails left after 4:30 p.m. will not be returned until the following business day.  For prescription refill requests, have your pharmacy contact our office.

## 2015-03-31 NOTE — Progress Notes (Signed)
..  April Page April Page's reason for visit today are for labs as scheduled per MD orders.  Venipuncture performed with a 23 gauge butterfly needle to R Antecubital.  April Page tolerated venipuncture well and without incident; questions were answered and patient was discharged.

## 2015-03-31 NOTE — Assessment & Plan Note (Addendum)
CML having been followed by Dr. Tressie Stalker in past while taking Gleevac.  According to records, she failed Gleevac therapy after several years at which time she was switched to a newer TKI, but could not afford the co-pay.  Therefore, she stopped taking the medication on her own accord.  She is on Tasigna beginning on 07/21/2014.  Labs today: CBC diff, CMET, BCR/ABL.  Patient cancelled her 03/20/2015 appointment due to weather (snow).  He notes that her fatigue is improved.  Her weight is stable compared to ~ 6 weeks ago.  Her weight is up over the past 2 months.  Hair loss continues.  This is mild.  Her peripheral neuropathy complaint from last visit is much improved and nearly resolved.  She notes mid-back pain, between her shoulder blades x 3 weeks.  It is not progressive.  She notes scapular pain as well bilaterally.  Nonspecific.  Described as an ache with pruritis.  Order placed for xray imaging at this time.  Return in 3-4 weeks for follow-up.

## 2015-03-31 NOTE — Progress Notes (Signed)
[name], MD [place] [address]  Chronic myeloid leukemia, BCR/ABL-positive, not having achieved remission ([facility])  Bilateral thoracic back pain - Plan: DG Scapula Left, DG Scapula Right, CANCELED: DG Thoracic Spine 4V  CURRENT THERAPY: Tasigna 300 mg BID every 12 hours  INTERVAL HISTORY: April Page 70 y.o. female returns for followup of CML having been followed by Dr. Tressie Stalker in past while taking Gleevac.  According to records, she failed Gleevac therapy after several years at which time she was switched to a newer TKI, but could not afford the co-pay.  Therefore, she stopped taking the medication on her own accord.  She is now on Tasigna beginning on 07/21/2014.    Chronic myelogenous leukemia (CML), BCR-ABL1-positive ([place])   06/01/2014 Tumor Marker Results for April Page, April Page (MRN [id number]) as of 12/02/2014 09:30  06/01/2014 09:59 BCR ABL1 / ABL1 IS: 71.220 (H)    07/14/2014 Bone Marrow Biopsy Bone Marrow, Aspirate,Biopsy, and Clot, right iliac - HYPERCELLULAR BONE MARROW WITH CHRONIC MYELOGENOUS LEUKEMIA. - SEE COMMENT. PERIPHERAL BLOOD: - CHRONIC MYELOGENOUS LEUKEMIA. Diagnosis Note Previous PCR analysis performed for bcr-abl1 is report   07/21/2014 - 08/04/2014 Chemotherapy Tasigna 300 mg BID   08/04/2014 Miscellaneous Ran out of 2 week sample secondary to issues with insurance approval.   08/15/2014 -  Chemotherapy Tasigna 300 mg BID   09/17/2014 Tumor Marker b3a2 transcript % 4.122    12/02/2014 Tumor Marker b3a2 transcript: 0.046     I personally reviewed and went over laboratory results with the patient.  The results are noted within this dictation.  She will have BCR/ABL testing done today as a 6 month evaluation of response to therapy.  She notes improvement overall with her past symptoms.  She notes that her fatigue is improved.  Her weight is very stable.  Peripheral neuropathy is improved and nearly resolved.    Her appetite remains  strong.  She denies any nausea or vomiting.  She reports mid back pain that radiates to each scapula bilaterally.  She reports that it is stable without progression.  It began 3 weeks ago.  She denies any trauma or sudden incident that may have started the issue.  She denies any heavy lifting.  She denies any chest pain, abdominal pain, jaw pain.  She denies any jaundice or scleral icterus.  She reports that it is an ache pain and is associated with pruritic skin.  She denies any pattern to the pain.  Nothing makes it better.  Nothing makes it worse. No rash noted.  Past Medical History  Diagnosis Date  . GERD (gastroesophageal reflux disease)   . Hypertension   . Obesity   . Hyperlipidemia   . Pessary maintenance 07/01/2013  . H/O: CML (chronic myeloid leukemia)   . Cancer ([place])   . Blood dyscrasia   . Arthritis     stiff knees    has Pessary maintenance; Chronic diarrhea; Leukocytosis; Diabetes type 2, uncontrolled ([place]); Atypical chest pain; GERD (gastroesophageal reflux disease); Chronic myelogenous leukemia (CML), BCR-ABL1-positive ([place]); and Itching in the vaginal area on her problem list.     has No Known Allergies.  Current Outpatient Prescriptions on File Prior to Visit  Medication Sig Dispense Refill  . Biotin 1000 MCG tablet Take 1,000 mcg by mouth daily.    . ergocalciferol (VITAMIN D2) 50000 UNITS capsule Take 1 capsule (50,000 Units total) by mouth once a week. 8 capsule 0  . lactase (LACTAID) 3000  UNITS tablet Take 3,000 Units by mouth as needed (uses when eating dairy products).    Marland Kitchen lisinopril (PRINIVIL,ZESTRIL) 10 MG tablet Take 1 tablet (10 mg total) by mouth daily. 30 tablet 3  . Multiple Vitamin (MULTIVITAMIN WITH MINERALS) TABS tablet Take 1 tablet by mouth daily.    . naproxen sodium (ANAPROX) 220 MG tablet Take 220 mg by mouth 2 (two) times daily as needed (for pain).    . nilotinib (TASIGNA) 150 MG capsule Take 2 capsules (300 mg total) by mouth every 12  (twelve) hours. 120 capsule 6  . ondansetron (ZOFRAN) 8 MG tablet Take 1 tablet (8 mg total) by mouth every 8 (eight) hours as needed for nausea or vomiting. 30 tablet 1  . potassium chloride SA (K-DUR,KLOR-CON) 20 MEQ tablet Take 1 tablet (20 mEq total) by mouth once. (Patient not taking: Reported on 01/03/2015) 15 tablet 0   No current facility-administered medications on file prior to visit.    Past Surgical History  Procedure Laterality Date  . Cholecystectomy    . Abdominal hysterectomy    . Hernia repair    . Oophorectomy    . Esophagogastroduodenoscopy  06/09/2002    NFA:OZHYQM upper gastrointestinal tract s/p  54-French Maloney dilator  . Colonoscopy  06/09/2002    VHQ:IONGEXBM hemorrhoids; otherwise normal rectum, colon   . Cystocele/rectocele repair  2009  . Colonoscopy N/A 08/25/2013    WUX:[id number] diverticulosis. Single colonic polyp-removed  s/p segmental biopsy and stool sample. random colon bx negative. +benign leiomyoma.    Denies any headaches, dizziness, double vision, fevers, chills, night sweats, nausea, vomiting, diarrhea, constipation, chest pain, heart palpitations, shortness of breath, blood in stool, black tarry stool, urinary pain, urinary burning, urinary frequency, hematuria.   PHYSICAL EXAMINATION  ECOG PERFORMANCE STATUS: 1  Filed Vitals:   03/31/15 1500  BP: 179/88  Pulse: 79  Temp: 98.5 F (36.9 C)  Resp: 18    GENERAL:alert, no distress, well nourished, well developed, comfortable, cooperative, smiling and unaccompanied. SKIN: skin color, texture, turgor are normal, no rashes or significant lesions HEAD: Normocephalic, No masses, lesions, tenderness or abnormalities EYES: normal, PERRLA, EOMI, Conjunctiva are pink and non-injected EARS: External ears normal OROPHARYNX:lips, buccal mucosa, and tongue normal and mucous membranes are moist  NECK: supple, trachea midline LYMPH:  No adenopathy noted BREAST: No examined. LUNGS: clear to  auscultation bilaterally. HEART: regular rate & rhythm without murmur, rub or gallop.  Normal S1 and S2. ABDOMEN:abdomen soft and normal bowel sounds and without tenderness on deep palpation. BACK: Back symmetric, no curvature. No dry skin, erythema, lesions, nodules, rash.  No pain on palpation.  ROM intact and full. EXTREMITIES:less then 2 second capillary refill, no skin discoloration, no cyanosis  NEURO: alert & oriented x 3 with fluent speech, no focal motor/sensory deficits, gait normal   LABORATORY DATA: CBC    Component Value Date/Time   WBC 9.1 03/31/2015 1400   RBC 4.77 03/31/2015 1400   HGB 13.3 03/31/2015 1400   HCT 40.9 03/31/2015 1400   PLT 249 03/31/2015 1400   MCV 85.7 03/31/2015 1400   MCH 27.9 03/31/2015 1400   MCHC 32.5 03/31/2015 1400   RDW 14.7 03/31/2015 1400   LYMPHSABS 3.0 03/31/2015 1400   MONOABS 0.7 03/31/2015 1400   EOSABS 0.2 03/31/2015 1400   BASOSABS 0.0 03/31/2015 1400      Chemistry      Component Value Date/Time   NA 143 03/31/2015 1400   K 3.5 03/31/2015 1400   CL  106 03/31/2015 1400   CO2 28 03/31/2015 1400   BUN 8 03/31/2015 1400   CREATININE 0.61 03/31/2015 1400      Component Value Date/Time   CALCIUM 9.8 03/31/2015 1400   ALKPHOS 89 03/31/2015 1400   AST 20 03/31/2015 1400   ALT 21 03/31/2015 1400   BILITOT 1.1 03/31/2015 1400       ASSESSMENT AND PLAN:  Chronic myelogenous leukemia (CML), BCR-ABL1-positive CML having been followed by Dr. Tressie Stalker in past while taking Gleevac.  According to records, she failed Gleevac therapy after several years at which time she was switched to a newer TKI, but could not afford the co-pay.  Therefore, she stopped taking the medication on her own accord.  She is on Tasigna beginning on 07/21/2014.  Labs today: CBC diff, CMET, BCR/ABL.  Patient cancelled her 03/20/2015 appointment due to weather (snow).  He notes that her fatigue is improved.  Her weight is stable compared to ~ 6 weeks ago.   Her weight is up over the past 2 months.  Hair loss continues.  This is mild.  Her peripheral neuropathy complaint from last visit is much improved and nearly resolved.  She notes mid-back pain, between her shoulder blades x 3 weeks.  It is not progressive.  She notes scapular pain as well bilaterally.  Nonspecific.  Described as an ache with pruritis.  Order placed for xray imaging at this time.  Return in 3-4 weeks for follow-up.     THERAPY PLAN:  Continue Tasigna as prescribed.    All questions were answered. The patient knows to call the clinic with any problems, questions or concerns. We can certainly see the patient much sooner if necessary.  Patient and plan discussed with Dr. Ancil Linsey and she is in agreement with the aforementioned.   This note is electronically signed by: Doy Mince 03/31/2015 5:25 PM

## 2015-04-06 ENCOUNTER — Ambulatory Visit (INDEPENDENT_AMBULATORY_CARE_PROVIDER_SITE_OTHER): Payer: Commercial Managed Care - HMO | Admitting: Adult Health

## 2015-04-06 ENCOUNTER — Encounter: Payer: Self-pay | Admitting: Adult Health

## 2015-04-06 VITALS — BP 160/86 | HR 60 | Ht 65.0 in | Wt 175.0 lb

## 2015-04-06 DIAGNOSIS — Z4689 Encounter for fitting and adjustment of other specified devices: Secondary | ICD-10-CM | POA: Diagnosis not present

## 2015-04-06 LAB — BCR-ABL1, CML/ALL, PCR, QUANT

## 2015-04-06 NOTE — Progress Notes (Signed)
Subjective:     Patient ID: April Page, female   DOB: 1945-05-13, 70 y.o.   MRN: [id number]  HPI April Page is a 70 year old black female in for a pessary cleaning, her last visit was 10/05/14.No complaints. 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  Review of Systems Patient denies any headaches, hearing loss, fatigue, blurred vision, shortness of breath, chest pain, abdominal pain, problems with bowel movements, urination, or intercourse(not having sex). No joint pain or mood swings. Reviewed past medical,surgical, social and family history. Reviewed medications and allergies.     Objective:   Physical Exam BP 160/86 mmHg  Pulse 60  Ht 5\' 5"  (1.651 m)  Wt 175 lb (79.379 kg)  BMI 29.12 kg/m2   Skin warm and dry.Pelvic: external genitalia is normal in appearance no lesions, vagina: has decreased color, moisture and rugae, pessary was removed and cleaned with soap and water and dried, and reinserted, she had no irritation noted, has pelvic relaxation,urethra has no lesions or masses noted, cervix and uterus are absent.  adnexa: no masses or tenderness noted. Bladder is non tender and no masses felt.  Assessment:     Pessary maintenance     Plan:     Follow up in 4 months for pessary maintenance

## 2015-04-06 NOTE — Patient Instructions (Signed)
Follow up in 4 months for pessary cleaning

## 2015-04-24 ENCOUNTER — Other Ambulatory Visit (HOSPITAL_COMMUNITY): Payer: Self-pay | Admitting: Internal Medicine

## 2015-04-24 DIAGNOSIS — Z1231 Encounter for screening mammogram for malignant neoplasm of breast: Secondary | ICD-10-CM

## 2015-04-25 ENCOUNTER — Ambulatory Visit (HOSPITAL_COMMUNITY): Payer: Commercial Managed Care - HMO | Admitting: Hematology & Oncology

## 2015-04-25 ENCOUNTER — Other Ambulatory Visit (HOSPITAL_COMMUNITY): Payer: Commercial Managed Care - HMO

## 2015-05-01 DIAGNOSIS — C9210 Chronic myeloid leukemia, BCR/ABL-positive, not having achieved remission: Secondary | ICD-10-CM | POA: Diagnosis not present

## 2015-05-01 DIAGNOSIS — I10 Essential (primary) hypertension: Secondary | ICD-10-CM | POA: Diagnosis not present

## 2015-05-01 DIAGNOSIS — M546 Pain in thoracic spine: Secondary | ICD-10-CM | POA: Diagnosis not present

## 2015-05-01 DIAGNOSIS — Z1389 Encounter for screening for other disorder: Secondary | ICD-10-CM | POA: Diagnosis not present

## 2015-05-01 DIAGNOSIS — Z681 Body mass index (BMI) 19 or less, adult: Secondary | ICD-10-CM | POA: Diagnosis not present

## 2015-05-01 DIAGNOSIS — E1165 Type 2 diabetes mellitus with hyperglycemia: Secondary | ICD-10-CM | POA: Diagnosis not present

## 2015-05-03 ENCOUNTER — Ambulatory Visit (HOSPITAL_COMMUNITY): Payer: Commercial Managed Care - HMO | Admitting: Oncology

## 2015-05-04 ENCOUNTER — Encounter (HOSPITAL_COMMUNITY): Payer: Commercial Managed Care - HMO

## 2015-05-04 ENCOUNTER — Other Ambulatory Visit: Payer: Self-pay

## 2015-05-04 ENCOUNTER — Encounter (HOSPITAL_COMMUNITY): Payer: Commercial Managed Care - HMO | Attending: Hematology & Oncology | Admitting: Oncology

## 2015-05-04 ENCOUNTER — Encounter (HOSPITAL_COMMUNITY): Payer: Self-pay | Admitting: Oncology

## 2015-05-04 VITALS — BP 181/84 | HR 70 | Temp 98.5°F | Resp 18 | Wt 173.0 lb

## 2015-05-04 DIAGNOSIS — D72829 Elevated white blood cell count, unspecified: Secondary | ICD-10-CM | POA: Insufficient documentation

## 2015-05-04 DIAGNOSIS — C9210 Chronic myeloid leukemia, BCR/ABL-positive, not having achieved remission: Secondary | ICD-10-CM | POA: Diagnosis not present

## 2015-05-04 LAB — CBC WITH DIFFERENTIAL/PLATELET
BASOS ABS: 0.1 10*3/uL (ref 0.0–0.1)
Basophils Relative: 1 %
Eosinophils Absolute: 0.2 10*3/uL (ref 0.0–0.7)
Eosinophils Relative: 2 %
HEMATOCRIT: 40 % (ref 36.0–46.0)
Hemoglobin: 13.1 g/dL (ref 12.0–15.0)
LYMPHS PCT: 30 %
Lymphs Abs: 2.6 10*3/uL (ref 0.7–4.0)
MCH: 28.2 pg (ref 26.0–34.0)
MCHC: 32.8 g/dL (ref 30.0–36.0)
MCV: 86.2 fL (ref 78.0–100.0)
MONO ABS: 0.5 10*3/uL (ref 0.1–1.0)
Monocytes Relative: 6 %
NEUTROS ABS: 5.2 10*3/uL (ref 1.7–7.7)
Neutrophils Relative %: 61 %
Platelets: 235 10*3/uL (ref 150–400)
RBC: 4.64 MIL/uL (ref 3.87–5.11)
RDW: 14.9 % (ref 11.5–15.5)
WBC: 8.5 10*3/uL (ref 4.0–10.5)

## 2015-05-04 LAB — COMPREHENSIVE METABOLIC PANEL
ALT: 21 U/L (ref 14–54)
AST: 21 U/L (ref 15–41)
Albumin: 4.2 g/dL (ref 3.5–5.0)
Alkaline Phosphatase: 86 U/L (ref 38–126)
Anion gap: 10 (ref 5–15)
BILIRUBIN TOTAL: 1.1 mg/dL (ref 0.3–1.2)
BUN: 11 mg/dL (ref 6–20)
CALCIUM: 9.5 mg/dL (ref 8.9–10.3)
CO2: 28 mmol/L (ref 22–32)
CREATININE: 0.67 mg/dL (ref 0.44–1.00)
Chloride: 104 mmol/L (ref 101–111)
GFR calc Af Amer: >60 mL/min (ref >60)
Glucose, Bld: 191 mg/dL — ABNORMAL HIGH (ref 65–99)
Potassium: 3.5 mmol/L (ref 3.5–5.1)
Sodium: 142 mmol/L (ref 135–145)
TOTAL PROTEIN: 7.8 g/dL (ref 6.5–8.1)

## 2015-05-04 LAB — MAGNESIUM: Magnesium: 2 mg/dL (ref 1.7–2.4)

## 2015-05-04 LAB — PATHOLOGIST SMEAR REVIEW

## 2015-05-04 NOTE — Progress Notes (Signed)
[name], MD [place] [address]  Chronic myeloid leukemia, BCR/ABL-positive, not having achieved remission ([place]) - Plan: CBC with Differential, Comprehensive metabolic panel, Pathologist smear review, Magnesium, losartan (COZAAR) 100 MG tablet, CBC with Differential, Comprehensive metabolic panel, Magnesium, EKG 12-Lead, Magnesium  CURRENT THERAPY: Tasigna 300 mg BID every 12 hours  INTERVAL HISTORY: April Page 70 y.o. female returns for followup of CML having been followed by Dr. Tressie Stalker in past while taking Gleevac.  According to records, she failed Gleevac therapy after several years at which time she was switched to a newer TKI, but could not afford the co-pay.  Therefore, she stopped taking the medication on her own accord.  She is now on Tasigna beginning on 07/21/2014.    Chronic myelogenous leukemia (CML), BCR-ABL1-positive ([place])   06/01/2014 Tumor Marker Results for April Page, April Page (MRN [id number]) as of 12/02/2014 09:30  06/01/2014 09:59 BCR ABL1 / ABL1 IS: 71.220 (H)    07/14/2014 Bone Marrow Biopsy Bone Marrow, Aspirate,Biopsy, and Clot, right iliac - HYPERCELLULAR BONE MARROW WITH CHRONIC MYELOGENOUS LEUKEMIA. - SEE COMMENT. PERIPHERAL BLOOD: - CHRONIC MYELOGENOUS LEUKEMIA. Diagnosis Note Previous PCR analysis performed for bcr-abl1 is report   07/21/2014 - 08/04/2014 Chemotherapy Tasigna 300 mg BID   08/04/2014 Miscellaneous Ran out of 2 week sample secondary to issues with insurance approval.   08/15/2014 -  Chemotherapy Tasigna 300 mg BID   09/17/2014 Tumor Marker b3a2 transcript % 4.122    12/02/2014 Tumor Marker b3a2 transcript: 0.046    03/31/2015 Tumor Marker b3a2 transcript:  <0.001 %      03/31/2015 Remission Complete molecular response, no detectable BCR/ABL using PCR assay    I personally reviewed and went over laboratory results with the patient.  The results are noted within this dictation.  Her CBC is within normal limits. Her  complete blood panel is also normal with a mild hyperglycemia.  She reports that she is feeling very well. She denies any complaints today. Next  She reports that her primary care doctor changed her blood pressure medication to losartan. We previously prescribed lisinopril given the QT prolongation risk with Tasigna in combination with Norvasc which is what she previously was on. She doesn't know if losartan is in addition to her lisinopril or its taking the place of. I asked her to call her primary care physician clarified this prior to starting the medication. Neither lisinopril nor losartan interferes with Tasigna.  Her BCR/ABL performed on 08/29/2015 is less than 0.001%.  With this response, she is in a complete molecular remission. She is excited to learn this.  Her weight is relatively stable.   Past Medical History  Diagnosis Date  . GERD (gastroesophageal reflux disease)   . Hypertension   . Obesity   . Hyperlipidemia   . Pessary maintenance 07/01/2013  . H/O: CML (chronic myeloid leukemia)   . Cancer ([place])   . Blood dyscrasia   . Arthritis     stiff knees    has Pessary maintenance; Chronic diarrhea; Leukocytosis; Diabetes type 2, uncontrolled ([place]); Atypical chest pain; GERD (gastroesophageal reflux disease); Chronic myelogenous leukemia (CML), BCR-ABL1-positive ([place]); and Itching in the vaginal area on her problem list.     has No Known Allergies.  Current Outpatient Prescriptions on File Prior to Visit  Medication Sig Dispense Refill  . Biotin 1000 MCG tablet Take 1,000 mcg by mouth daily.    . ergocalciferol (VITAMIN D2) 50000 UNITS capsule Take 1 capsule (  50,000 Units total) by mouth once a week. 8 capsule 0  . lactase (LACTAID) 3000 UNITS tablet Take 3,000 Units by mouth as needed (uses when eating dairy products).    Marland Kitchen lisinopril (PRINIVIL,ZESTRIL) 10 MG tablet Take 1 tablet (10 mg total) by mouth daily. 30 tablet 3  . Multiple Vitamin (MULTIVITAMIN WITH MINERALS)  TABS tablet Take 1 tablet by mouth daily.    . naproxen sodium (ANAPROX) 220 MG tablet Take 220 mg by mouth 2 (two) times daily as needed (for pain).    . nilotinib (TASIGNA) 150 MG capsule Take 2 capsules (300 mg total) by mouth every 12 (twelve) hours. 120 capsule 6  . ondansetron (ZOFRAN) 8 MG tablet Take 1 tablet (8 mg total) by mouth every 8 (eight) hours as needed for nausea or vomiting. 30 tablet 1  . potassium chloride SA (K-DUR,KLOR-CON) 20 MEQ tablet Take 1 tablet (20 mEq total) by mouth once. 15 tablet 0  . Pyridoxine HCl (VITAMIN B-6 PO) Take by mouth once a week. Reported on 04/06/2015     No current facility-administered medications on file prior to visit.    Past Surgical History  Procedure Laterality Date  . Cholecystectomy    . Abdominal hysterectomy    . Hernia repair    . Oophorectomy    . Esophagogastroduodenoscopy  06/09/2002    XNA:TFTDDU upper gastrointestinal tract s/p  54-French Maloney dilator  . Colonoscopy  06/09/2002    KGU:RKYHCWCB hemorrhoids; otherwise normal rectum, colon   . Cystocele/rectocele repair  2009  . Colonoscopy N/A 08/25/2013    JSE:GBTDVVO diverticulosis. Single colonic polyp-removed  s/p segmental biopsy and stool sample. random colon bx negative. +benign leiomyoma.    Denies any headaches, dizziness, double vision, fevers, chills, night sweats, nausea, vomiting, diarrhea, constipation, chest pain, heart palpitations, shortness of breath, blood in stool, black tarry stool, urinary pain, urinary burning, urinary frequency, hematuria.   PHYSICAL EXAMINATION  ECOG PERFORMANCE STATUS: 1  Filed Vitals:   05/04/15 1051  BP: 181/84  Pulse: 70  Temp: 98.5 F (36.9 C)  Resp: 18    GENERAL:alert, no distress, well nourished, well developed, comfortable, cooperative, smiling and unaccompanied. SKIN: skin color, texture, turgor are normal, no rashes or significant lesions HEAD: Normocephalic, No masses, lesions, tenderness or  abnormalities EYES: normal, PERRLA, EOMI, Conjunctiva are pink and non-injected EARS: External ears normal OROPHARYNX:lips, buccal mucosa, and tongue normal and mucous membranes are moist  NECK: supple, trachea midline LYMPH:  No adenopathy noted BREAST: No examined. LUNGS: clear to auscultation bilaterallyAnd normal percussion. HEART: regular rate & rhythm without murmur, rub or gallop.  Normal S1 and S2. ABDOMEN:abdomen soft and normal bowel sounds and without tenderness on deep palpation. No splenomegaly or hepatomegaly identified. BACK: Back symmetric, no curvature.  EXTREMITIES:less then 2 second capillary refill, no skin discoloration, no cyanosis  NEURO: alert & oriented x 3 with fluent speech, no focal motor/sensory deficits, gait normal   LABORATORY DATA: CBC    Component Value Date/Time   WBC 8.5 05/04/2015 0909   RBC 4.64 05/04/2015 0909   HGB 13.1 05/04/2015 0909   HCT 40.0 05/04/2015 0909   PLT 235 05/04/2015 0909   MCV 86.2 05/04/2015 0909   MCH 28.2 05/04/2015 0909   MCHC 32.8 05/04/2015 0909   RDW 14.9 05/04/2015 0909   LYMPHSABS 2.6 05/04/2015 0909   MONOABS 0.5 05/04/2015 0909   EOSABS 0.2 05/04/2015 0909   BASOSABS 0.1 05/04/2015 0909      Chemistry  Component Value Date/Time   NA 142 05/04/2015 0909   K 3.5 05/04/2015 0909   CL 104 05/04/2015 0909   CO2 28 05/04/2015 0909   BUN 11 05/04/2015 0909   CREATININE 0.67 05/04/2015 0909      Component Value Date/Time   CALCIUM 9.5 05/04/2015 0909   ALKPHOS 86 05/04/2015 0909   AST 21 05/04/2015 0909   ALT 21 05/04/2015 0909   BILITOT 1.1 05/04/2015 0909       ASSESSMENT AND PLAN:  Chronic myelogenous leukemia (CML), BCR-ABL1-positive CML having been followed by Dr. Tressie Stalker in past while taking Gleevac.  According to records, she failed Gleevac therapy after several years at which time she was switched to a newer TKI, but could not afford the co-pay.  Therefore, she stopped taking the  medication on her own accord.  She is on Tasigna beginning on 07/21/2014.  BCR/ABL is <0.001% at her 6 month milestone and therefore, indicative of a complete molecular response.  Her CBC also demonstrates a complete hematologic response as well.  Labs today: CBC diff, CMET, Magnesium, pathologist review of peripheral smear to rule out immature cells (ie myelocytes, promyelocytes, blasts).  EKG today to evaluate QTc interval.  EKG today shows a QT/QTc interval of 446/471 ms compared to 452/452 on 02/24/2015, compared to 432/452 on 01/03/2016.  Her weight is stable overall.  She was prescribed losartan by her primary care physician, Dr. Gerarda Fraction. She was previously on Norvasc and we discontinued this medication due to the risk of QT prolongation with Norvasc in combination with Tasigna. As a result, we discontinued her Norvasc and started lisinopril for blood pressure control. She will need to contact her primary care physician's office today to find out if she is to take losartan in combination with lisinopril or discontinue her lisinopril and replace it with losartan. From an interaction standpoint, neither lisinopril nor losartan interfere or cause a QT prolongation risk in combination with Tasigna and therefore both are safe for the patient.  Labs in 4 weeks': CBC differential, complete metabolic panel, and magnesium.  We'll repeat an EKG in 4 weeks.  Return in 4 weeks for follow-up.  I think if she is doing well at her four-week follow-up appointment, we could consider spacing out her appointments.  We will need to repeat BCR ABL testing towards the end of April 2017.     THERAPY PLAN:  Continue Tasigna as prescribed.    Monitoring Response to TKI therapy  Bone marrow cytogenetics: at diagnosis, failure to reach response milestones and any sign of loss of response (defined as hematologic or cytogenetic relapse)  Quantitative RT-PCR using is preferred: at diagnosis, every 3 months after  initiating treatment, after BCR-ABL1 0.1% - , 1% (IS) has been achieved, every 3 months for 2 years and every 3 - 6 months thereafter. If there is 1 log increase in BCR-ABL1 transcript levels with MMR, QPCR should be repeated in 1 - 3 months  BCR-ABL kinase domain mutation analysis: Chronic phase: failure to reach response milestones, any sign of loss of response (defined as hematologic or cytogenetic relapse) 1 - log increase in BCR-ABL1 transcript levels and loss of MMR. Disease progression to accelerated or blast phase  Criteria for Hematologic and molecular response   Complete Hematologic response Complete normalization of peripheral blood counts with leukocyte count ,10x103/L Platelet count < 450 x 109/L NO immature cells such as myelocytes, promyelocytes or blasts in peripheral blood No signs and symptoms of disease with disappearance of palpable  splenomegaly  Molecular response Early molecular response  BCR-ABL1<= 10% (IS) at 3 and 6 months Major molecular response BCR-ABL1,0.1%  (IS) or > = 3 log reduction in BCR-ABL3mNA from the standardized baseline  Complete molecular response no detectable BCR-ABL119mA using a QPCR assay with a sensitivity of at least 4.5 logs below the standardized baseline   All questions were answered. The patient knows to call the clinic with any problems, questions or concerns. We can certainly see the patient much sooner if necessary.  Patient and plan discussed with Dr. ShAncil Linseynd she is in agreement with the aforementioned.   This note is electronically signed by: KEDoy Mince/23/2017 11:53 AM

## 2015-05-04 NOTE — Assessment & Plan Note (Addendum)
CML having been followed by Dr. Tressie Stalker in past while taking Gleevac.  According to records, she failed Gleevac therapy after several years at which time she was switched to a newer TKI, but could not afford the co-pay.  Therefore, she stopped taking the medication on her own accord.  She is on Tasigna beginning on 07/21/2014.  BCR/ABL is <0.001% at her 6 month milestone and therefore, indicative of a complete molecular response.  Her CBC also demonstrates a complete hematologic response as well.  Labs today: CBC diff, CMET, Magnesium, pathologist review of peripheral smear to rule out immature cells (ie myelocytes, promyelocytes, blasts).  EKG today to evaluate QTc interval.  EKG today shows a QT/QTc interval of 446/471 ms compared to 452/452 on 02/24/2015, compared to 432/452 on 01/03/2016.  Her weight is stable overall.  She was prescribed losartan by her primary care physician, Dr. Gerarda Fraction. She was previously on Norvasc and we discontinued this medication due to the risk of QT prolongation with Norvasc in combination with Tasigna. As a result, we discontinued her Norvasc and started lisinopril for blood pressure control. She will need to contact her primary care physician's office today to find out if she is to take losartan in combination with lisinopril or discontinue her lisinopril and replace it with losartan. From an interaction standpoint, neither lisinopril nor losartan interfere or cause a QT prolongation risk in combination with Tasigna and therefore both are safe for the patient.  Labs in 4 weeks': CBC differential, complete metabolic panel, and magnesium.  We'll repeat an EKG in 4 weeks.  Return in 4 weeks for follow-up.  I think if she is doing well at her four-week follow-up appointment, we could consider spacing out her appointments.  We will need to repeat BCR ABL testing towards the end of April 2017.

## 2015-05-04 NOTE — Patient Instructions (Addendum)
[place] at [facility] Discharge Instructions  RECOMMENDATIONS MADE BY THE CONSULTANT AND ANY TEST RESULTS WILL BE SENT TO YOUR REFERRING PHYSICIAN.  Your BCR/ABL (cancer marker) shows that you have experienced a complete molecular response at your 6 month milestone.  In other words, a complete remission. Your EKG today was very stable without any changes. You will return in 4 weeks for follow-up and lab appointment.  We will also repeat an EKG at that time. Neither Losartan (prescribed by Dr. Gerarda Fraction) nor Lisinopril (prescribed by the [facility]) interfere with Tasigna (Your chemo-pill).  Therefore, you need to call Dr. Nolon Rod office and find out the following:  Do you take both blood pressure medications (Lisinopril + Losartan)      OR  Do you take the Losartan (Dr. Gerarda Fraction) and discontinue the Lisinopril [facility]). Please call us with any questions or concerns.   Thank you for choosing [place] at [facility] to provide your oncology and hematology care.  To afford each patient quality time with our provider, please arrive at least 15 minutes before your scheduled appointment time.   Beginning January 23rd 2017 lab work for the [facility] will be done in the  Main lab at [facility] on 1st floor. If you have a lab appointment with the [place] [facility] please come in thru the  Main Entrance and check in at the main information desk  You need to re-schedule your appointment should you arrive 10 or more minutes late.  We strive to give you quality time with our providers, and arriving late affects you and other patients whose appointments are after yours.  Also, if you no show three or more times for appointments you may be dismissed from the clinic at the providers discretion.     Again, thank you for choosing [facility].  Our hope is that these requests will decrease the amount of time that you wait before being seen by  our physicians.       _____________________________________________________________  Should you have questions after your visit to [facility], please contact our office at [phone] between the hours of 8:30 a.m. and 4:30 p.m.  Voicemails left after 4:30 p.m. will not be returned until the following business day.  For prescription refill requests, have your pharmacy contact our office.

## 2015-06-01 ENCOUNTER — Ambulatory Visit (HOSPITAL_COMMUNITY)
Admission: RE | Admit: 2015-06-01 | Discharge: 2015-06-01 | Disposition: A | Discharge status: Home / Self Care | Payer: Commercial Managed Care - HMO | Source: Ambulatory Visit | Attending: Internal Medicine | Admitting: Internal Medicine

## 2015-06-01 DIAGNOSIS — Z6829 Body mass index (BMI) 29.0-29.9, adult: Secondary | ICD-10-CM | POA: Diagnosis not present

## 2015-06-01 DIAGNOSIS — E1165 Type 2 diabetes mellitus with hyperglycemia: Secondary | ICD-10-CM | POA: Diagnosis not present

## 2015-06-01 DIAGNOSIS — Z0000 Encounter for general adult medical examination without abnormal findings: Secondary | ICD-10-CM | POA: Diagnosis not present

## 2015-06-01 DIAGNOSIS — E663 Overweight: Secondary | ICD-10-CM | POA: Diagnosis not present

## 2015-06-01 DIAGNOSIS — E11319 Type 2 diabetes mellitus with unspecified diabetic retinopathy without macular edema: Secondary | ICD-10-CM | POA: Diagnosis not present

## 2015-06-01 DIAGNOSIS — I10 Essential (primary) hypertension: Secondary | ICD-10-CM | POA: Diagnosis not present

## 2015-06-01 DIAGNOSIS — Z1231 Encounter for screening mammogram for malignant neoplasm of breast: Secondary | ICD-10-CM | POA: Insufficient documentation

## 2015-06-01 DIAGNOSIS — K219 Gastro-esophageal reflux disease without esophagitis: Secondary | ICD-10-CM | POA: Diagnosis not present

## 2015-06-01 DIAGNOSIS — Z1389 Encounter for screening for other disorder: Secondary | ICD-10-CM | POA: Diagnosis not present

## 2015-06-02 ENCOUNTER — Encounter (HOSPITAL_COMMUNITY): Payer: Self-pay | Admitting: Hematology & Oncology

## 2015-06-02 ENCOUNTER — Other Ambulatory Visit: Payer: Self-pay

## 2015-06-02 ENCOUNTER — Encounter (HOSPITAL_COMMUNITY): Payer: Commercial Managed Care - HMO | Attending: Hematology & Oncology | Admitting: Hematology & Oncology

## 2015-06-02 ENCOUNTER — Encounter (HOSPITAL_COMMUNITY): Payer: Commercial Managed Care - HMO

## 2015-06-02 VITALS — BP 160/79 | HR 80 | Temp 98.5°F | Resp 18 | Wt 172.0 lb

## 2015-06-02 DIAGNOSIS — C9210 Chronic myeloid leukemia, BCR/ABL-positive, not having achieved remission: Secondary | ICD-10-CM

## 2015-06-02 DIAGNOSIS — I10 Essential (primary) hypertension: Secondary | ICD-10-CM | POA: Diagnosis not present

## 2015-06-02 DIAGNOSIS — C9211 Chronic myeloid leukemia, BCR/ABL-positive, in remission: Secondary | ICD-10-CM

## 2015-06-02 DIAGNOSIS — D72829 Elevated white blood cell count, unspecified: Secondary | ICD-10-CM | POA: Insufficient documentation

## 2015-06-02 LAB — CBC WITH DIFFERENTIAL/PLATELET
BASOS ABS: 0 10*3/uL (ref 0.0–0.1)
BASOS PCT: 0 %
EOS ABS: 0.1 10*3/uL (ref 0.0–0.7)
EOS PCT: 1 %
HCT: 39.3 % (ref 36.0–46.0)
Hemoglobin: 12.7 g/dL (ref 12.0–15.0)
Lymphocytes Relative: 28 %
Lymphs Abs: 2.3 10*3/uL (ref 0.7–4.0)
MCH: 27.9 pg (ref 26.0–34.0)
MCHC: 32.3 g/dL (ref 30.0–36.0)
MCV: 86.2 fL (ref 78.0–100.0)
Monocytes Absolute: 0.5 10*3/uL (ref 0.1–1.0)
Monocytes Relative: 7 %
Neutro Abs: 5.1 10*3/uL (ref 1.7–7.7)
Neutrophils Relative %: 64 %
PLATELETS: 232 10*3/uL (ref 150–400)
RBC: 4.56 MIL/uL (ref 3.87–5.11)
RDW: 14.9 % (ref 11.5–15.5)
WBC: 7.9 10*3/uL (ref 4.0–10.5)

## 2015-06-02 LAB — COMPREHENSIVE METABOLIC PANEL
ALT: 21 U/L (ref 14–54)
ANION GAP: 10 (ref 5–15)
AST: 22 U/L (ref 15–41)
Albumin: 4.3 g/dL (ref 3.5–5.0)
Alkaline Phosphatase: 95 U/L (ref 38–126)
BUN: 14 mg/dL (ref 6–20)
CALCIUM: 9.3 mg/dL (ref 8.9–10.3)
CO2: 27 mmol/L (ref 22–32)
Chloride: 103 mmol/L (ref 101–111)
Creatinine, Ser: 0.8 mg/dL (ref 0.44–1.00)
GFR calc Af Amer: >60 mL/min (ref >60)
GFR calc non Af Amer: >60 mL/min (ref >60)
GLUCOSE: 206 mg/dL — AB (ref 65–99)
POTASSIUM: 3.5 mmol/L (ref 3.5–5.1)
SODIUM: 140 mmol/L (ref 135–145)
TOTAL PROTEIN: 7.7 g/dL (ref 6.5–8.1)
Total Bilirubin: 1.2 mg/dL (ref 0.3–1.2)

## 2015-06-02 LAB — MAGNESIUM: Magnesium: 1.9 mg/dL (ref 1.7–2.4)

## 2015-06-02 NOTE — Progress Notes (Signed)
[name] NOTE  Patient Care Team: [employer]mond [employer], MD as PCP - General (Internal Medicine)  CHIEF COMPLAINTS/PURPOSE OF CONSULTATION:  CML    Chronic myelogenous leukemia (CML), BCR-ABL1-positive (April Page)   06/01/2014 Tumor Marker Results for BERYL, HORNBERGER (MRN [id number]) as of 12/02/2014 09:30  06/01/2014 09:59 BCR ABL1 / ABL1 IS: 71.220 (H)    07/14/2014 Bone Marrow Biopsy Bone Marrow, Aspirate,Biopsy, and Clot, right iliac - HYPERCELLULAR BONE MARROW WITH CHRONIC MYELOGENOUS LEUKEMIA. - SEE COMMENT. PERIPHERAL BLOOD: - CHRONIC MYELOGENOUS LEUKEMIA. Diagnosis Note Previous PCR analysis performed for bcr-abl1 is report   07/21/2014 - 08/04/2014 Chemotherapy Tasigna 300 mg BID   08/04/2014 Miscellaneous Ran out of 2 week sample secondary to issues with insurance approval.   08/15/2014 -  Chemotherapy Tasigna 300 mg BID   09/17/2014 Tumor Marker b3a2 transcript % 4.122    12/02/2014 Tumor Marker b3a2 transcript: 0.046    03/31/2015 Tumor Marker b3a2 transcript:  <0.001 %      03/31/2015 Remission Complete molecular response, no detectable BCR/ABL using PCR assay     HISTORY OF PRESENTING ILLNESS:  April Page 70 y.o. female is here because of CML. Interestingly she was seen and followed by Dr.Neijstrom for CML. Per records she was on [place] and it was felt after several years she had a Gleevec failure. She was then switched to newer generation TKI, she states she could not afford the co-pay and therefore simply stop taking the medication and coming to her hematologist. She is now on [place] and doing well.  Ms. [name] returns to the [place] alone today. She says she's been well.  Her weight has been stable, her nausea has been okay, she's been eating okay. She states that her energy has been better, but that she's been staying on the go and confirms that she's doing better in that regard. Weight has stabilized.  She was advised today that her  blodwork looks great and that there are zero signs of leukemia in her blood. She says she has not had any trouble getting her medication, and that she always takes it on time.  She says she had her physical with Dr. Gerarda Fraction yesterday and was given another blood pressure medication, remarking "I don't know why I have three." This is her main complaint at the start of her appointment, which she requests to review. She was advised that the three medications are fine to take together and at times are required for adequate BP control. She indicates some concern about paying for her medicines, noting that she hasn't shopped around for different priced prescriptions.  She says that her family is doing well and that her daughters have let up on her, and aren't "driving her crazy anymore."   She notes that she had her mammogram yesterday.   MEDICAL HISTORY:  Past Medical History  Diagnosis Date  . GERD (gastroesophageal reflux disease)   . Hypertension   . Obesity   . Hyperlipidemia   . Pessary maintenance 07/01/2013  . H/O: CML (chronic myeloid leukemia)   . Cancer ([place])   . Blood dyscrasia   . Arthritis     stiff knees    SURGICAL HISTORY: Past Surgical History  Procedure Laterality Date  . Cholecystectomy    . Abdominal hysterectomy    . Hernia repair    . Oophorectomy    . Esophagogastroduodenoscopy  06/09/2002    TDS:KAJGOT upper gastrointestinal tract s/p  54-French Maloney dilator  .  Colonoscopy  06/09/2002    UKG:URKYHCWC hemorrhoids; otherwise normal rectum, colon   . Cystocele/rectocele repair  2009  . Colonoscopy N/A 08/25/2013    BJS:EGBTDVV diverticulosis. Single colonic polyp-removed  s/p segmental biopsy and stool sample. random colon bx negative. +benign leiomyoma.    SOCIAL HISTORY: Social History   Social History  . Marital Status: Widowed    Spouse Name: N/A  . Number of Children: 6  . Years of Education: associate   Occupational History  .     Social History  Main Topics  . Smoking status: Never Smoker   . Smokeless tobacco: Never Used  . Alcohol Use: No  . Drug Use: No  . Sexual Activity: Not Currently    Birth Control/ Protection: Surgical     Comment: hyst   Other Topics Concern  . Not on file   Social History Narrative    FAMILY HISTORY: Family History  Problem Relation Age of Onset  . Heart disease Child 10    cardiac arrest  . Anuerysm Mother     deceased age 18, brain anuerysm  . Early death Mother 79  . Heart disease Father   . Hypertension Sister   . Obesity Brother   . Hypertension Sister   . Arthritis Sister   . Colon cancer Neg Hx    indicated that her mother is deceased. She indicated that her father is deceased. She indicated that all of her three sisters are alive. She indicated that her brother is alive. She indicated that her maternal grandmother is deceased. She indicated that her maternal grandfather is deceased. She indicated that her paternal grandmother is deceased. She indicated that her paternal grandfather is deceased. She indicated that all of her four daughters are alive. She indicated that only one of her two sons is alive. She indicated that her child is deceased.   ALLERGIES:  has No Known Allergies.  MEDICATIONS:  Current Outpatient Prescriptions  Medication Sig Dispense Refill  . Biotin 1000 MCG tablet Take 1,000 mcg by mouth daily.    . ergocalciferol (VITAMIN D2) 50000 UNITS capsule Take 1 capsule (50,000 Units total) by mouth once a week. 8 capsule 0  . hydrALAZINE (APRESOLINE) 50 MG tablet     . lactase (LACTAID) 3000 UNITS tablet Take 3,000 Units by mouth as needed (uses when eating dairy products).    Marland Kitchen lisinopril (PRINIVIL,ZESTRIL) 10 MG tablet Take 1 tablet (10 mg total) by mouth daily. 30 tablet 3  . losartan (COZAAR) 100 MG tablet Take 100 mg by mouth daily. Reported on 05/04/2015    . metFORMIN (GLUCOPHAGE-XR) 500 MG 24 hr tablet     . Multiple Vitamin (MULTIVITAMIN WITH MINERALS) TABS  tablet Take 1 tablet by mouth daily.    . naproxen sodium (ANAPROX) 220 MG tablet Take 220 mg by mouth 2 (two) times daily as needed (for pain).    . nilotinib (TASIGNA) 150 MG capsule Take 2 capsules (300 mg total) by mouth every 12 (twelve) hours. 120 capsule 6  . ondansetron (ZOFRAN) 8 MG tablet Take 1 tablet (8 mg total) by mouth every 8 (eight) hours as needed for nausea or vomiting. 30 tablet 1  . potassium chloride SA (K-DUR,KLOR-CON) 20 MEQ tablet Take 1 tablet (20 mEq total) by mouth once. 15 tablet 0  . Pyridoxine HCl (VITAMIN B-6 PO) Take by mouth once a week. Reported on 04/06/2015     No current facility-administered medications for this visit.    Review of Systems  Constitutional: Positive for malaise/fatigue. Negative for fever, chills and weight loss.  HENT: Negative for congestion, hearing loss, nosebleeds, sore throat and tinnitus.   Eyes: Negative for blurred vision, double vision, pain and discharge.  Respiratory: Negative for cough, hemoptysis, sputum production, shortness of breath and wheezing.   Cardiovascular: Negative for chest pain, palpitations, claudication, leg swelling and PND.  Gastrointestinal: Negative for heartburn, nausea, vomiting, abdominal pain, diarrhea, constipation, blood in stool and melena.  Genitourinary: Negative for dysuria, urgency, frequency and hematuria.  Musculoskeletal: Negative for myalgias, joint pain and falls.  Skin: Negative for itching and rash.  Neurological: Negative for dizziness, tingling, tremors, sensory change, speech change, focal weakness, seizures, loss of consciousness and headaches.  Endo/Heme/Allergies: Does not bruise/bleed easily.  Psychiatric/Behavioral: Negative for depression, suicidal ideas, memory loss and substance abuse. The patient is not nervous/anxious and does not have insomnia.    14 point ROS was done and is otherwise as detailed above or in HPI   PHYSICAL EXAMINATION: ECOG PERFORMANCE STATUS: 1 -  Symptomatic but completely ambulatory  Filed Vitals:   06/02/15 1100  BP: 160/79  Pulse: 80  Temp: 98.5 F (36.9 C)  Resp: 18   Filed Weights   06/02/15 1100  Weight: 172 lb (78.019 kg)     Physical Exam  Constitutional: She is oriented to person, place, and time and well-developed, well-nourished, and in no distress.  HENT:  Head: Normocephalic and atraumatic.  Nose: Nose normal.  Mouth/Throat: Oropharynx is clear and moist. No oropharyngeal exudate.  Eyes: Conjunctivae and EOM are normal. Pupils are equal, round, and reactive to light. Right eye exhibits no discharge. Left eye exhibits no discharge. No scleral icterus.  Neck: Normal range of motion. Neck supple. No tracheal deviation present. No thyromegaly present.  Cardiovascular: Normal rate, regular rhythm and normal heart sounds.  Exam reveals no gallop and no friction rub.   No murmur heard. Pulmonary/Chest: Effort normal and breath sounds normal. She has no wheezes. She has no rales.  Abdominal: Soft. Bowel sounds are normal. She exhibits no distension and no mass. There is no tenderness. There is no rebound and no guarding.  Musculoskeletal: Normal range of motion. She exhibits no edema.  Lymphadenopathy:    She has no cervical adenopathy.  Neurological: She is alert and oriented to person, place, and time. She has normal reflexes. No cranial nerve deficit. Gait normal. Coordination normal.  Skin: Skin is warm and dry. No rash noted.  Psychiatric: Mood, memory, affect and judgment normal.  Nursing note and vitals reviewed.   LABORATORY DATA:  I have reviewed the data as listed  Bone Marrow, Aspirate,Biopsy, and Clot, right iliac - HYPERCELLULAR BONE MARROW WITH CHRONIC MYELOGENOUS LEUKEMIA. - SEE COMMENT. PERIPHERAL BLOOD: - CHRONIC MYELOGENOUS LEUKEMIA. 3% Blasts  Results for LIVANA, YERIAN (MRN [id number]) as of 06/02/2015 16:24  Ref. Range 06/02/2015 09:55  Sodium Latest Ref Range: 135-145 mmol/L 140    Potassium Latest Ref Range: 3.5-5.1 mmol/L 3.5  Chloride Latest Ref Range: 101-111 mmol/L 103  CO2 Latest Ref Range: 22-32 mmol/L 27  BUN Latest Ref Range: 6-20 mg/dL 14  Creatinine Latest Ref Range: 0.44-1.00 mg/dL 0.80  Calcium Latest Ref Range: 8.9-10.3 mg/dL 9.3  EGFR (Non-African Amer.) Latest Ref Range: >60 mL/min >60  EGFR (African American) Latest Ref Range: >60 mL/min >60  Glucose Latest Ref Range: 65-99 mg/dL 206 (H)  Anion gap Latest Ref Range: 5-15  10  Magnesium Latest Ref Range: 1.7-2.4 mg/dL 1.9  Alkaline Phosphatase Latest Ref  Range: 38-126 U/L 95  Albumin Latest Ref Range: 3.5-5.0 g/dL 4.3  AST Latest Ref Range: 15-41 U/L 22  ALT Latest Ref Range: 14-54 U/L 21  Total Protein Latest Ref Range: 6.5-8.1 g/dL 7.7  Total Bilirubin Latest Ref Range: 0.3-1.2 mg/dL 1.2  WBC Latest Ref Range: 4.0-10.5 K/uL 7.9  RBC Latest Ref Range: 3.87-5.11 MIL/uL 4.56  Hemoglobin Latest Ref Range: 12.0-15.0 g/dL 12.7  HCT Latest Ref Range: 36.0-46.0 % 39.3  MCV Latest Ref Range: 78.0-100.0 fL 86.2  MCH Latest Ref Range: 26.0-34.0 pg 27.9  MCHC Latest Ref Range: 30.0-36.0 g/dL 32.3  RDW Latest Ref Range: 11.5-15.5 % 14.9  Platelets Latest Ref Range: 150-400 K/uL 232  Neutrophils Latest Units: % 64  Lymphocytes Latest Units: % 28  Monocytes Relative Latest Units: % 7  Eosinophil Latest Units: % 1  Basophil Latest Units: % 0  NEUT# Latest Ref Range: 1.7-7.7 K/uL 5.1  Lymphocyte # Latest Ref Range: 0.7-4.0 K/uL 2.3  Monocyte # Latest Ref Range: 0.1-1.0 K/uL 0.5  Eosinophils Absolute Latest Ref Range: 0.0-0.7 K/uL 0.1  Basophils Absolute Latest Ref Range: 0.0-0.1 K/uL 0.0      Diagnosis Note Previous PCR analysis performed for bcr-abl1 is reportedly positive. Given the latter and the overall morphologic changes in the bone nmarrow, the findings are consistent with chronic myelogenous leukemia. Clinical correlation is Recommended.  ASSESSMENT & PLAN:  CML originally diagnosed in  2010 Off therapy for several years BMBX on 07/14/2014 with chronic phase CML History of Gleevec failure Tasigna '150mg'$  po bid Weight loss, now stabilized Complete Molecular Response  She is taking tasigna regularly. She denies missing any doses. EKG is acceptable.  She is doing well on current therapy. Activity is back at her prior baseline.  She continues to follow with her PCP in regards to her BP and other well care.   We will see her back in 3 months. She will need a PCR for BCR-ABL.  She does not need any refills today.  Monitoring Response to TKI therapy  Bone marrow cytogenetics: at diagnosis, failure to reach response milestones and any sign of loss of response (defined as hematologic or cytogenetic relapse)  Quantitative RT-PCR using is preferred: at diagnosis, every 3 months after initiating treatment, after BCR-ABL1 0.1% - , 1% (IS) has been achieved, every 3 months for 2 years and every 3 - 6 months thereafter. If there is 1 log increase in BCR-ABL1 transcript levels with MMR, QPCR should be repeated in 1 - 3 months  BCR-ABL kinase domain mutation analysis: Chronic phase: failure to reach response milestones, any sign of loss of response (defined as hematologic or cytogenetic relapse) 1 - log increase in BCR-ABL1 transcript levels and loss of MMR. Disease progression to accelerated or blast phase  Criteria for Hematologic and molecular response   Complete Hematologic response Complete normalization of peripheral blood counts with leukocyte count ,10x103/L Platelet count < 450 x 109/L NO immature cells such as myelocytes, promyelocytes or blasts in peripheral blood No signs and symptoms of disease with disappearance of palpable splenomegaly  Molecular response Early molecular response  BCR-ABL1<= 10% (IS) at 3 and 6 months Major molecular response BCR-ABL1,0.1%  (IS) or > = 3 log reduction in BCR-ABL42mNA from the standardized baseline  Complete molecular response no  detectable BCR-ABL12mA using a QPCR assay with a sensitivity of at least 4.5 logs below the standardized baseline  Orders Placed This Encounter  Procedures  . CBC with Differential    Standing Status: Future  Number of Occurrences:      Standing Expiration Date: 06/01/2016  . Comprehensive metabolic panel    Standing Status: Future     Number of Occurrences:      Standing Expiration Date: 06/01/2016  . Magnesium    Standing Status: Future     Number of Occurrences:      Standing Expiration Date: 06/01/2016  . BCR-ABL1, CML/ALL, PCR, QUANT    Standing Status: Future     Number of Occurrences:      Standing Expiration Date: 06/01/2016    All questions were answered. The patient knows to call the clinic with any problems, questions or concerns. This note was electronically signed.    This document serves as a record of services personally performed by Ancil Linsey, MD. It was created on her behalf by Toni Amend, a trained medical scribe. The creation of this record is based on the scribe's personal observations and the provider's statements to them. This document has been checked and approved by the attending provider.  I have reviewed the above documentation for accuracy and completeness, and I agree with the above.  Kelby Fam. Whitney Muse, MD

## 2015-06-02 NOTE — Patient Instructions (Addendum)
[facility] at [facility] Discharge Instructions  RECOMMENDATIONS MADE BY THE CONSULTANT AND ANY TEST RESULTS WILL BE SENT TO YOUR REFERRING PHYSICIAN.   Exam and discussion by Dr Whitney Muse today You can take hydralizine with your tasigna  Blood work looks good today Return to see the doctor in 3 months with labs Please call the clinic if you have any questions or concerns    Thank you for choosing [facility] to provide your oncology and hematology care.  To afford each patient quality time with our provider, please arrive at least 15 minutes before your scheduled appointment time.   Beginning January 23rd 2017 lab work for the [facility] will be done in the  Main lab at [facility] on 1st floor. If you have a lab appointment with the [facility] please come in thru the  Main Entrance and check in at the main information desk  You need to re-schedule your appointment should you arrive 10 or more minutes late.  We strive to give you quality time with our providers, and arriving late affects you and other patients whose appointments are after yours.  Also, if you no show three or more times for appointments you may be dismissed from the clinic at the providers discretion.     Again, thank you for choosing [facility].  Our hope is that these requests will decrease the amount of time that you wait before being seen by our physicians.       _____________________________________________________________  Should you have questions after your visit to [facility], please contact our office at [phone] between the hours of 8:30 a.m. and 4:30 p.m.  Voicemails left after 4:30 p.m. will not be returned until the following business day.  For prescription refill requests, have your pharmacy contact our office.         Resources For Cancer Patients and their Caregivers ? American Cancer Society: Can  assist with transportation, wigs, general needs, runs Look Good Feel Better.        (251)740-8441 ? Cancer Care: Provides financial assistance, online support groups, medication/co-pay assistance.  1-800-813-HOPE 307-103-4494) ? [facility] Assists [place] cancer patients and their families through emotional , educational and financial support.  309-332-1171 ? [place] DSS Where to apply for food stamps, Medicaid and utility assistance. 228-045-1460 ? [facility]: Transportation to medical appointments. 239-518-9714 ? Social Security Administration: May apply for disability if have a Stage IV cancer. 276 222 6234 873-302-8623 ? [employer], Disability and Transit Services: Assists with nutrition, care and transit needs. 770-243-7661

## 2015-06-05 DIAGNOSIS — Z0000 Encounter for general adult medical examination without abnormal findings: Secondary | ICD-10-CM | POA: Diagnosis not present

## 2015-06-05 DIAGNOSIS — E781 Pure hyperglyceridemia: Secondary | ICD-10-CM | POA: Diagnosis not present

## 2015-07-04 ENCOUNTER — Other Ambulatory Visit (HOSPITAL_COMMUNITY): Payer: Self-pay | Admitting: Oncology

## 2015-07-04 DIAGNOSIS — I10 Essential (primary) hypertension: Secondary | ICD-10-CM

## 2015-07-11 ENCOUNTER — Telehealth (HOSPITAL_COMMUNITY): Payer: Self-pay | Admitting: Emergency Medicine

## 2015-07-11 NOTE — Telephone Encounter (Signed)
Notified pt to refer refills of lisinipril to PCP.  Pt states that he changed it to amolodipine

## 2015-07-12 ENCOUNTER — Other Ambulatory Visit (HOSPITAL_COMMUNITY): Payer: Self-pay | Admitting: Registered Nurse

## 2015-07-12 ENCOUNTER — Ambulatory Visit (HOSPITAL_COMMUNITY)
Admission: RE | Admit: 2015-07-12 | Discharge: 2015-07-12 | Disposition: A | Discharge status: Home / Self Care | Payer: Commercial Managed Care - HMO | Source: Ambulatory Visit | Attending: Registered Nurse | Admitting: Registered Nurse

## 2015-07-12 DIAGNOSIS — M25561 Pain in right knee: Secondary | ICD-10-CM

## 2015-07-12 DIAGNOSIS — Z1389 Encounter for screening for other disorder: Secondary | ICD-10-CM | POA: Insufficient documentation

## 2015-07-12 DIAGNOSIS — W19XXXA Unspecified fall, initial encounter: Secondary | ICD-10-CM | POA: Insufficient documentation

## 2015-07-12 DIAGNOSIS — Z6829 Body mass index (BMI) 29.0-29.9, adult: Secondary | ICD-10-CM | POA: Diagnosis not present

## 2015-07-12 DIAGNOSIS — M1711 Unilateral primary osteoarthritis, right knee: Secondary | ICD-10-CM | POA: Diagnosis not present

## 2015-07-12 DIAGNOSIS — S8991XA Unspecified injury of right lower leg, initial encounter: Secondary | ICD-10-CM | POA: Diagnosis not present

## 2015-08-04 ENCOUNTER — Ambulatory Visit: Payer: Commercial Managed Care - HMO | Admitting: Adult Health

## 2015-09-04 DIAGNOSIS — E6609 Other obesity due to excess calories: Secondary | ICD-10-CM | POA: Diagnosis not present

## 2015-09-04 DIAGNOSIS — E1165 Type 2 diabetes mellitus with hyperglycemia: Secondary | ICD-10-CM | POA: Diagnosis not present

## 2015-09-04 DIAGNOSIS — E113391 Type 2 diabetes mellitus with moderate nonproliferative diabetic retinopathy without macular edema, right eye: Secondary | ICD-10-CM | POA: Diagnosis not present

## 2015-09-04 DIAGNOSIS — J018 Other acute sinusitis: Secondary | ICD-10-CM | POA: Diagnosis not present

## 2015-09-04 DIAGNOSIS — C9210 Chronic myeloid leukemia, BCR/ABL-positive, not having achieved remission: Secondary | ICD-10-CM | POA: Diagnosis not present

## 2015-09-04 DIAGNOSIS — Z6830 Body mass index (BMI) 30.0-30.9, adult: Secondary | ICD-10-CM | POA: Diagnosis not present

## 2015-09-04 DIAGNOSIS — E118 Type 2 diabetes mellitus with unspecified complications: Secondary | ICD-10-CM | POA: Diagnosis not present

## 2015-09-04 DIAGNOSIS — E669 Obesity, unspecified: Secondary | ICD-10-CM | POA: Diagnosis not present

## 2015-09-04 DIAGNOSIS — H8113 Benign paroxysmal vertigo, bilateral: Secondary | ICD-10-CM | POA: Diagnosis not present

## 2015-09-05 ENCOUNTER — Other Ambulatory Visit (HOSPITAL_COMMUNITY): Payer: Self-pay | Admitting: Oncology

## 2015-09-05 DIAGNOSIS — Z79899 Other long term (current) drug therapy: Secondary | ICD-10-CM

## 2015-09-05 DIAGNOSIS — C9210 Chronic myeloid leukemia, BCR/ABL-positive, not having achieved remission: Secondary | ICD-10-CM

## 2015-09-05 MED ORDER — NILOTINIB HCL 150 MG PO CAPS: 300.0000 mg | ORAL_CAPSULE | Freq: Two times a day (BID) | ORAL | Status: DC

## 2015-09-06 ENCOUNTER — Other Ambulatory Visit (HOSPITAL_COMMUNITY): Payer: Commercial Managed Care - HMO

## 2015-09-06 ENCOUNTER — Ambulatory Visit (HOSPITAL_COMMUNITY): Payer: Commercial Managed Care - HMO | Admitting: Hematology & Oncology

## 2015-09-08 ENCOUNTER — Ambulatory Visit (INDEPENDENT_AMBULATORY_CARE_PROVIDER_SITE_OTHER): Payer: Commercial Managed Care - HMO | Admitting: Adult Health

## 2015-09-08 ENCOUNTER — Encounter: Payer: Self-pay | Admitting: Adult Health

## 2015-09-08 VITALS — BP 148/82 | HR 72 | Ht 65.0 in | Wt 177.0 lb

## 2015-09-08 DIAGNOSIS — Z4689 Encounter for fitting and adjustment of other specified devices: Secondary | ICD-10-CM

## 2015-09-08 DIAGNOSIS — N813 Complete uterovaginal prolapse: Secondary | ICD-10-CM | POA: Diagnosis not present

## 2015-09-08 NOTE — Patient Instructions (Signed)
Follow  Up in 6 months for pessary cleaning or before of needed

## 2015-09-08 NOTE — Progress Notes (Signed)
Subjective:     Patient ID: TA GAME, female   DOB: February 11, 1946, 70 y.o.   MRN: [id number]  HPI Llona is a 70 year old black female in for a pessary cleaning, she has no complaints with it today, no itching or vaginal irritation.She says she is doing well.   Review of Systems For pessary maintenance, no itching or vaginal irritation  Reviewed past medical,surgical, social and family history. Reviewed medications and allergies.     Objective:   Physical Exam BP 148/82 mmHg  Pulse 72  Ht 5\' 5"  (1.651 m)  Wt 177 lb (80.287 kg)  BMI 29.45 kg/m2   Skin warm and dry.Pelvic: external genitalia is normal in appearance no lesions, vagina: pessary removed and cleansed with soap and water, no lesions or rubbing irritation noted, pessary reinserted,urethra has no lesions or masses noted, cervix and uterus are absent, adnexa: no masses or tenderness noted. Bladder is non tender and no masses felt Assessment:     Pessary maintenance     Plan:      Follow up in 6 months for cleaning or before if needed

## 2015-09-15 DIAGNOSIS — E663 Overweight: Secondary | ICD-10-CM | POA: Diagnosis not present

## 2015-09-15 DIAGNOSIS — Z6829 Body mass index (BMI) 29.0-29.9, adult: Secondary | ICD-10-CM | POA: Diagnosis not present

## 2015-09-15 DIAGNOSIS — E1165 Type 2 diabetes mellitus with hyperglycemia: Secondary | ICD-10-CM | POA: Diagnosis not present

## 2015-09-15 DIAGNOSIS — I10 Essential (primary) hypertension: Secondary | ICD-10-CM | POA: Diagnosis not present

## 2015-09-15 DIAGNOSIS — R201 Hypoesthesia of skin: Secondary | ICD-10-CM | POA: Diagnosis not present

## 2015-09-15 DIAGNOSIS — E669 Obesity, unspecified: Secondary | ICD-10-CM | POA: Diagnosis not present

## 2015-09-15 DIAGNOSIS — E782 Mixed hyperlipidemia: Secondary | ICD-10-CM | POA: Diagnosis not present

## 2015-09-15 DIAGNOSIS — C92Z0 Other myeloid leukemia not having achieved remission: Secondary | ICD-10-CM | POA: Diagnosis not present

## 2015-10-05 DIAGNOSIS — H5210 Myopia, unspecified eye: Secondary | ICD-10-CM | POA: Diagnosis not present

## 2015-10-05 DIAGNOSIS — I10 Essential (primary) hypertension: Secondary | ICD-10-CM | POA: Diagnosis not present

## 2015-10-05 DIAGNOSIS — H2510 Age-related nuclear cataract, unspecified eye: Secondary | ICD-10-CM | POA: Diagnosis not present

## 2015-10-24 ENCOUNTER — Encounter (HOSPITAL_COMMUNITY): Payer: Commercial Managed Care - HMO | Attending: Oncology | Admitting: Oncology

## 2015-10-24 ENCOUNTER — Encounter (HOSPITAL_COMMUNITY): Payer: Commercial Managed Care - HMO | Attending: Hematology & Oncology

## 2015-10-24 ENCOUNTER — Encounter (HOSPITAL_COMMUNITY): Payer: Self-pay | Admitting: Oncology

## 2015-10-24 VITALS — BP 154/76 | HR 66 | Temp 98.6°F | Resp 18 | Wt 175.3 lb

## 2015-10-24 DIAGNOSIS — C9211 Chronic myeloid leukemia, BCR/ABL-positive, in remission: Secondary | ICD-10-CM

## 2015-10-24 LAB — CBC WITH DIFFERENTIAL/PLATELET
BASOS PCT: 1 %
Basophils Absolute: 0 10*3/uL (ref 0.0–0.1)
Eosinophils Absolute: 0.1 10*3/uL (ref 0.0–0.7)
Eosinophils Relative: 1 %
HEMATOCRIT: 36.1 % (ref 36.0–46.0)
HEMOGLOBIN: 11.6 g/dL — AB (ref 12.0–15.0)
Lymphocytes Relative: 28 %
Lymphs Abs: 1.9 10*3/uL (ref 0.7–4.0)
MCH: 28.2 pg (ref 26.0–34.0)
MCHC: 32.1 g/dL (ref 30.0–36.0)
MCV: 87.6 fL (ref 78.0–100.0)
MONOS PCT: 9 %
Monocytes Absolute: 0.6 10*3/uL (ref 0.1–1.0)
NEUTROS ABS: 4.1 10*3/uL (ref 1.7–7.7)
NEUTROS PCT: 61 %
Platelets: 249 10*3/uL (ref 150–400)
RBC: 4.12 MIL/uL (ref 3.87–5.11)
RDW: 14.7 % (ref 11.5–15.5)
WBC: 6.7 10*3/uL (ref 4.0–10.5)

## 2015-10-24 LAB — COMPREHENSIVE METABOLIC PANEL
ALBUMIN: 4 g/dL (ref 3.5–5.0)
ALK PHOS: 93 U/L (ref 38–126)
ALT: 20 U/L (ref 14–54)
ANION GAP: 7 (ref 5–15)
AST: 23 U/L (ref 15–41)
BILIRUBIN TOTAL: 1.6 mg/dL — AB (ref 0.3–1.2)
BUN: 17 mg/dL (ref 6–20)
CALCIUM: 9.5 mg/dL (ref 8.9–10.3)
CO2: 27 mmol/L (ref 22–32)
CREATININE: 1.23 mg/dL — AB (ref 0.44–1.00)
Chloride: 106 mmol/L (ref 101–111)
GFR calc Af Amer: 50 mL/min — ABNORMAL LOW (ref >60)
GFR calc non Af Amer: 43 mL/min — ABNORMAL LOW (ref >60)
GLUCOSE: 166 mg/dL — AB (ref 65–99)
Potassium: 3.5 mmol/L (ref 3.5–5.1)
Sodium: 140 mmol/L (ref 135–145)
TOTAL PROTEIN: 7.6 g/dL (ref 6.5–8.1)

## 2015-10-24 LAB — MAGNESIUM: Magnesium: 1.8 mg/dL (ref 1.7–2.4)

## 2015-10-24 NOTE — Progress Notes (Signed)
[facility] NOTE  Patient Care Team: [employer]mond [employer], MD as PCP - General (Internal Medicine)  CHIEF COMPLAINTS/PURPOSE OF CONSULTATION:  CML    Chronic myelogenous leukemia (CML), BCR-ABL1-positive ([place])   06/01/2014 Tumor Marker    Results for April, Page (MRN [id number]) as of 12/02/2014 09:30  06/01/2014 09:59 BCR ABL1 / ABL1 IS: 71.220 (H)      07/14/2014 Bone Marrow Biopsy    Bone Marrow, Aspirate,Biopsy, and Clot, right iliac - HYPERCELLULAR BONE MARROW WITH CHRONIC MYELOGENOUS LEUKEMIA. - SEE COMMENT. PERIPHERAL BLOOD: - CHRONIC MYELOGENOUS LEUKEMIA. Diagnosis Note Previous PCR analysis performed for bcr-abl1 is report     07/21/2014 - 08/04/2014 Chemotherapy    Tasigna 300 mg BID     08/04/2014 Miscellaneous    Ran out of 2 week sample secondary to issues with insurance approval.     08/15/2014 -  Chemotherapy    Tasigna 300 mg BID     09/17/2014 Tumor Marker    b3a2 transcript % 4.122      12/02/2014 Tumor Marker    b3a2 transcript: 0.046      03/31/2015 Tumor Marker    b3a2 transcript:  <0.001 %        03/31/2015 Remission    Complete molecular response, no detectable BCR/ABL using PCR assay       HISTORY OF PRESENTING ILLNESS:  April Page 70 y.o. female is here because of CML. Interestingly she was seen and followed by Dr.Neijstrom for CML. Per records she was on [place] and it was felt after several years she had a Gleevec failure. She was then switched to newer generation TKI, she states she could not afford the co-pay and therefore simply stop taking the medication and coming to her hematologist. She is now on [place] and doing well.  April Page returns to the [facility] alone today. She says she's been well.  Her weight has been stable, her nausea has been okay, she's been eating okay. She states that her energy has been better, but that she's been staying on the go and confirms that she's doing better in that  regard. Weight has stabilized.  She was advised today that her blodwork looks great and that there are zero signs of leukemia in her blood. She says she has not had any trouble getting her medication, and that she always takes it on time.  She says she had her physical with Dr. Gerarda Fraction yesterday and was given another blood pressure medication, remarking "I don't know why I have three." This is her main complaint at the start of her appointment, which she requests to review. She was advised that the three medications are fine to take together and at times are required for adequate BP control. She indicates some concern about paying for her medicines, noting that she hasn't shopped around for different priced prescriptions.  She says that her family is doing well and that her daughters have let up on her, and aren't "driving her crazy anymore."   She notes that she had her mammogram yesterday.  //   10/24/2015  April Page returns to the [facility] today unaccompanied.  She confirms taking her pills two times a day. She receives them easily and confirms that she doesn't have to pay too much money.  Since her last appointment, she hasn't been to the ER, hasn't had a fever; denies any complaints since last we saw her.  When asked about follow-up,  she notes that she goes and gets her mammogram every year and sees her doctor regularly. She does not smoke.  During the physical exam, she notes that she lives by herself.   MEDICAL HISTORY:  Past Medical History:  Diagnosis Date   Arthritis    stiff knees   Blood dyscrasia    Cancer ([place])    GERD (gastroesophageal reflux disease)    H/O: CML (chronic myeloid leukemia)    Hyperlipidemia    Hypertension    Obesity    Pessary maintenance 07/01/2013    SURGICAL HISTORY: Past Surgical History:  Procedure Laterality Date   ABDOMINAL HYSTERECTOMY     CHOLECYSTECTOMY     COLONOSCOPY  06/09/2002   LMB:EMLJQGBE hemorrhoids;  otherwise normal rectum, colon    COLONOSCOPY N/A 08/25/2013   EFE:OFHQRFX diverticulosis. Single colonic polyp-removed  s/p segmental biopsy and stool sample. random colon bx negative. +benign leiomyoma.   cystocele/rectocele repair  2009   ESOPHAGOGASTRODUODENOSCOPY  06/09/2002   JOI:TGPQDI upper gastrointestinal tract s/p  54-French Maloney dilator   HERNIA REPAIR     OOPHORECTOMY      SOCIAL HISTORY: Social History   Social History   Marital status: Widowed    Spouse name: N/A   Number of children: 6   Years of education: associate   Occupational History    Retired   Social History Main Topics   Smoking status: Never Smoker   Smokeless tobacco: Never Used   Alcohol use No   Drug use: No   Sexual activity: Not Currently    Birth control/ protection: Surgical     Comment: hyst   Other Topics Concern   Not on file   Social History Narrative   No narrative on file    FAMILY HISTORY: Family History  Problem Relation Age of Onset   Heart disease Child 28    cardiac arrest   Anuerysm Mother     deceased age 11, brain anuerysm   Early death Mother 61   Heart disease Father    Hypertension Sister    Obesity Brother    Hypertension Sister    Arthritis Sister    Colon cancer Neg Hx    indicated that her mother is deceased. She indicated that her father is deceased. She indicated that all of her three sisters are alive. She indicated that her brother is alive. She indicated that her maternal grandmother is deceased. She indicated that her maternal grandfather is deceased. She indicated that her paternal grandmother is deceased. She indicated that her paternal grandfather is deceased. She indicated that all of her four daughters are alive. She indicated that only one of her two sons is alive. She indicated that her child is deceased.    ALLERGIES:  has No Known Allergies.  MEDICATIONS:  Current Outpatient Prescriptions  Medication Sig  Dispense Refill   Biotin 1000 MCG tablet Take 1,000 mcg by mouth daily.     ergocalciferol (VITAMIN D2) 50000 UNITS capsule Take 1 capsule (50,000 Units total) by mouth once a week. 8 capsule 0   hydrALAZINE (APRESOLINE) 50 MG tablet Take 50 mg by mouth.      lactase (LACTAID) 3000 UNITS tablet Take 3,000 Units by mouth as needed (uses when eating dairy products).     lisinopril (PRINIVIL,ZESTRIL) 10 MG tablet Take 1 tablet (10 mg total) by mouth daily. 30 tablet 3   losartan (COZAAR) 100 MG tablet Take 100 mg by mouth daily. Reported on 05/04/2015     metFORMIN (  GLUCOPHAGE-XR) 500 MG 24 hr tablet Take 500 mg by mouth daily with breakfast. Reported on 09/08/2015     Multiple Vitamin (MULTIVITAMIN WITH MINERALS) TABS tablet Take 1 tablet by mouth daily.     naproxen sodium (ANAPROX) 220 MG tablet Take 220 mg by mouth 2 (two) times daily as needed (for pain).     nilotinib (TASIGNA) 150 MG capsule Take 2 capsules (300 mg total) by mouth every 12 (twelve) hours. 120 capsule 6   ondansetron (ZOFRAN) 8 MG tablet Take 1 tablet (8 mg total) by mouth every 8 (eight) hours as needed for nausea or vomiting. (Patient not taking: Reported on 09/08/2015) 30 tablet 1   potassium chloride SA (K-DUR,KLOR-CON) 20 MEQ tablet Take 1 tablet (20 mEq total) by mouth once. (Patient not taking: Reported on 09/08/2015) 15 tablet 0   Pyridoxine HCl (VITAMIN B-6 PO) Take by mouth once a week. Reported on 09/08/2015     No current facility-administered medications for this visit.     Review of Systems  Constitutional: Positive for malaise/fatigue. Negative for fever, chills and weight loss.  HENT: Negative for congestion, hearing loss, nosebleeds, sore throat and tinnitus.   Eyes: Negative for blurred vision, double vision, pain and discharge.  Respiratory: Negative for cough, hemoptysis, sputum production, shortness of breath and wheezing.   Cardiovascular: Negative for chest pain, palpitations, claudication,  leg swelling and PND.  Gastrointestinal: Negative for heartburn, nausea, vomiting, abdominal pain, diarrhea, constipation, blood in stool and melena.  Genitourinary: Negative for dysuria, urgency, frequency and hematuria.  Musculoskeletal: Negative for myalgias, joint pain and falls.  Skin: Negative for itching and rash.  Neurological: Negative for dizziness, tingling, tremors, sensory change, speech change, focal weakness, seizures, loss of consciousness and headaches.  Endo/Heme/Allergies: Does not bruise/bleed easily.  Psychiatric/Behavioral: Negative for depression, suicidal ideas, memory loss and substance abuse. The patient is not nervous/anxious and does not have insomnia.    14 point ROS was done and is otherwise as detailed above or in HPI    PHYSICAL EXAMINATION: ECOG PERFORMANCE STATUS: 1 - Symptomatic but completely ambulatory  Vitals:   10/24/15 1110  BP: (!) 154/76  Pulse: 66  Resp: 18  Temp: 98.6 F (37 C)   Filed Weights   10/24/15 1110  Weight: 175 lb 4.8 oz (79.5 kg)     Physical Exam  Constitutional: She is oriented to person, place, and time and well-developed, well-nourished, and in no distress.  HENT:  Head: Normocephalic and atraumatic.  Nose: Nose normal.  Mouth/Throat: Oropharynx is clear and moist. No oropharyngeal exudate.  Eyes: Conjunctivae and EOM are normal. Pupils are equal, round, and reactive to light. Right eye exhibits no discharge. Left eye exhibits no discharge. No scleral icterus.  Neck: Normal range of motion. Neck supple. No tracheal deviation present. No thyromegaly present.  Cardiovascular: Normal rate, regular rhythm and normal heart sounds.  Exam reveals no gallop and no friction rub.   No murmur heard. Pulmonary/Chest: Effort normal and breath sounds normal. She has no wheezes. She has no rales.  Abdominal: Soft. Bowel sounds are normal. She exhibits no distension and no mass. There is no tenderness. There is no rebound and no  guarding.  Musculoskeletal: Normal range of motion. She exhibits no edema.  Lymphadenopathy:    She has no cervical adenopathy.  Neurological: She is alert and oriented to person, place, and time. She has normal reflexes. No cranial nerve deficit. Gait normal. Coordination normal.  Skin: Skin is warm and dry. No  rash noted.  Psychiatric: Mood, memory, affect and judgment normal.  Nursing note and vitals reviewed.   LABORATORY DATA:  I have reviewed the data as listed  Bone Marrow, Aspirate,Biopsy, and Clot, right iliac - HYPERCELLULAR BONE MARROW WITH CHRONIC MYELOGENOUS LEUKEMIA. - SEE COMMENT. PERIPHERAL BLOOD: - CHRONIC MYELOGENOUS LEUKEMIA. 3% Blasts  Results for April Page, April Page (MRN [id number]) as of 06/02/2015 16:24  Ref. Range 06/02/2015 09:55  Sodium Latest Ref Range: 135-145 mmol/L 140  Potassium Latest Ref Range: 3.5-5.1 mmol/L 3.5  Chloride Latest Ref Range: 101-111 mmol/L 103  CO2 Latest Ref Range: 22-32 mmol/L 27  BUN Latest Ref Range: 6-20 mg/dL 14  Creatinine Latest Ref Range: 0.44-1.00 mg/dL 0.80  Calcium Latest Ref Range: 8.9-10.3 mg/dL 9.3  EGFR (Non-African Amer.) Latest Ref Range: >60 mL/min >60  EGFR (African American) Latest Ref Range: >60 mL/min >60  Glucose Latest Ref Range: 65-99 mg/dL 206 (H)  Anion gap Latest Ref Range: 5-15  10  Magnesium Latest Ref Range: 1.7-2.4 mg/dL 1.9  Alkaline Phosphatase Latest Ref Range: 38-126 U/L 95  Albumin Latest Ref Range: 3.5-5.0 g/dL 4.3  AST Latest Ref Range: 15-41 U/L 22  ALT Latest Ref Range: 14-54 U/L 21  Total Protein Latest Ref Range: 6.5-8.1 g/dL 7.7  Total Bilirubin Latest Ref Range: 0.3-1.2 mg/dL 1.2  WBC Latest Ref Range: 4.0-10.5 K/uL 7.9  RBC Latest Ref Range: 3.87-5.11 MIL/uL 4.56  Hemoglobin Latest Ref Range: 12.0-15.0 g/dL 12.7  HCT Latest Ref Range: 36.0-46.0 % 39.3  MCV Latest Ref Range: 78.0-100.0 fL 86.2  MCH Latest Ref Range: 26.0-34.0 pg 27.9  MCHC Latest Ref Range: 30.0-36.0 g/dL 32.3  RDW  Latest Ref Range: 11.5-15.5 % 14.9  Platelets Latest Ref Range: 150-400 K/uL 232  Neutrophils Latest Units: % 64  Lymphocytes Latest Units: % 28  Monocytes Relative Latest Units: % 7  Eosinophil Latest Units: % 1  Basophil Latest Units: % 0  NEUT# Latest Ref Range: 1.7-7.7 K/uL 5.1  Lymphocyte # Latest Ref Range: 0.7-4.0 K/uL 2.3  Monocyte # Latest Ref Range: 0.1-1.0 K/uL 0.5  Eosinophils Absolute Latest Ref Range: 0.0-0.7 K/uL 0.1  Basophils Absolute Latest Ref Range: 0.0-0.1 K/uL 0.0      Diagnosis Note Previous PCR analysis performed for bcr-abl1 is reportedly positive. Given the latter and the overall morphologic changes in the bone nmarrow, the findings are consistent with chronic myelogenous leukemia. Clinical correlation is Recommended.  ASSESSMENT & PLAN:  CML originally diagnosed in 2010 Off therapy for several years BMBX on 07/14/2014 with chronic phase CML History of Gleevec failure Tasigna '150mg'$  po bid Weight loss, now stabilized Complete Molecular Response  She is taking tasigna regularly. She denies missing any doses. EKG is acceptable.  She is doing well on current therapy. Activity is back at her prior baseline.  She continues to follow with her PCP in regards to her BP and other well care.   We will see her back in 3 months. She will need a PCR for BCR-ABL.  She does not need any refills today.  Monitoring Response to TKI therapy  Bone marrow cytogenetics: at diagnosis, failure to reach response milestones and any sign of loss of response (defined as hematologic or cytogenetic relapse)  Quantitative RT-PCR using is preferred: at diagnosis, every 3 months after initiating treatment, after BCR-ABL1 0.1% - , 1% (IS) has been achieved, every 3 months for 2 years and every 3 - 6 months thereafter. If there is 1 log increase in BCR-ABL1 transcript levels with MMR,  QPCR should be repeated in 1 - 3 months  BCR-ABL kinase domain mutation analysis: Chronic phase:  failure to reach response milestones, any sign of loss of response (defined as hematologic or cytogenetic relapse) 1 - log increase in BCR-ABL1 transcript levels and loss of MMR. Disease progression to accelerated or blast phase  Criteria for Hematologic and molecular response   Complete Hematologic response Complete normalization of peripheral blood counts with leukocyte count ,10x103/L Platelet count < 450 x 109/L NO immature cells such as myelocytes, promyelocytes or blasts in peripheral blood No signs and symptoms of disease with disappearance of palpable splenomegaly  Molecular response Early molecular response  BCR-ABL1<= 10% (IS) at 3 and 6 months Major molecular response BCR-ABL1,0.1%  (IS) or > = 3 log reduction in BCR-ABL65mNA from the standardized baseline  Complete molecular response no detectable BCR-ABL177mA using a QPCR assay with a sensitivity of at least 4.5 logs below the standardized baseline     10/24/2015  Everything looks good, her blood test look good; her blood counts are doing well.  She confirms that they will keep refilling and sending her medicine to her. She knows to call if her pills don't come in.  She will return in 3 months, with cbc and chemistry.  No orders of the defined types were placed in this encounter.  All questions were answered. The patient knows to call the clinic with any problems, questions or concerns. We can certainly see the patient much sooner if necessary.   This document serves as a record of services personally performed by JaForest GleasonMD. It was created on his behalf by KaToni Amenda trained medical scribe. The creation of this record is based on the scribe's personal observations and the provider's statements to them. This document has been checked and approved by the attending provider.  I have reviewed the above documentation for accuracy and completeness and I agree with the above.  Attending's note Patient was  examined by me all the lab data has been reviewed.  Complete blood count remains stable.  Patient confirms that she has been taking the signal and regular basis.  Remains asymptomatic.  At present time hematologically recent remains stable   Patient would need another EKG prior to next appointment as well as molecular response assessment prior to next appointment in 3 months which will be arranged.

## 2015-10-24 NOTE — Addendum Note (Signed)
Addended by: Donnie Aho on: 10/24/2015 01:09 PM   Modules accepted: Orders

## 2015-10-24 NOTE — Patient Instructions (Addendum)
[facility] at [facility] Discharge Instructions  RECOMMENDATIONS MADE BY THE CONSULTANT AND ANY TEST RESULTS WILL BE SENT TO YOUR REFERRING PHYSICIAN.  Exam done today by Dr. Oliva Bustard. Labs reviewed at visit. Return to clinic on 3 months. Labs in 3 months Continue with the Tagsigna  Thank you for choosing [facility] to provide your oncology and hematology care.  To afford each patient quality time with our provider, please arrive at least 15 minutes before your scheduled appointment time.   Beginning January 23rd 2017 lab work for the [facility] will be done in the  Main lab at [facility] on 1st floor. If you have a lab appointment with the [facility] please come in thru the  Main Entrance and check in at the main information desk  You need to re-schedule your appointment should you arrive 10 or more minutes late.  We strive to give you quality time with our providers, and arriving late affects you and other patients whose appointments are after yours.  Also, if you no show three or more times for appointments you may be dismissed from the clinic at the providers discretion.     Again, thank you for choosing [facility]  Our hope is that these requests will decrease the amount of time that you wait before being seen by our physicians.       _____________________________________________________________  Should you have questions after your visit to [facility], please contact our office at [phone] between the hours of 8:30 a.m. and 4:30 p.m.  Voicemails left after 4:30 p.m. will not be returned until the following business day.  For prescription refill requests, have your pharmacy contact our office.         Resources For Cancer Patients and their Caregivers ? American Cancer Society: Can assist with transportation, wigs, general needs, runs Look Good Feel Better.         ([phone] ? Cancer Care: Provides financial assistance, online support groups, medication/co-pay assistance.  1-800-813-HOPE 737-748-2378) ? [place] Assists [place] cancer patients and their families through emotional , educational and financial support.  819-595-0105 ? [place] DSS Where to apply for food stamps, Medicaid and utility assistance. 830-020-5650 ? [facility]: Transportation to medical appointments. 281-482-5628 ? Social Security Administration: May apply for disability if have a Stage IV cancer. (810) 296-5983 210-177-3383 ? [employer], Disability and Transit Services: Assists with nutrition, care and transit needs. [place] Support Programs: @10RELATIVEDAYS @ > Cancer Support Group  2nd Tuesday of the month 1pm-2pm, Journey Room  > Creative Journey  3rd Tuesday of the month 1130am-1pm, Journey Room  > Look Good Feel Better  1st Wednesday of the month 10am-12 noon, Journey Room (Call [facility] to register [phone])

## 2015-10-27 DIAGNOSIS — E113293 Type 2 diabetes mellitus with mild nonproliferative diabetic retinopathy without macular edema, bilateral: Secondary | ICD-10-CM | POA: Diagnosis not present

## 2015-10-30 ENCOUNTER — Other Ambulatory Visit (HOSPITAL_COMMUNITY): Payer: Self-pay

## 2015-10-30 DIAGNOSIS — C9211 Chronic myeloid leukemia, BCR/ABL-positive, in remission: Secondary | ICD-10-CM

## 2015-11-02 ENCOUNTER — Encounter (HOSPITAL_COMMUNITY): Payer: Commercial Managed Care - HMO

## 2015-11-02 DIAGNOSIS — C9211 Chronic myeloid leukemia, BCR/ABL-positive, in remission: Secondary | ICD-10-CM

## 2015-11-08 LAB — BCR-ABL1, CML/ALL, PCR, QUANT

## 2016-01-24 ENCOUNTER — Encounter (HOSPITAL_COMMUNITY): Payer: Commercial Managed Care - HMO

## 2016-01-24 ENCOUNTER — Encounter (HOSPITAL_COMMUNITY): Payer: Self-pay | Admitting: Hematology & Oncology

## 2016-01-24 ENCOUNTER — Encounter (HOSPITAL_COMMUNITY): Payer: Commercial Managed Care - HMO | Attending: Hematology & Oncology | Admitting: Hematology & Oncology

## 2016-01-24 VITALS — BP 183/81 | HR 71 | Temp 97.9°F | Resp 16 | Wt 166.4 lb

## 2016-01-24 DIAGNOSIS — E876 Hypokalemia: Secondary | ICD-10-CM | POA: Diagnosis not present

## 2016-01-24 DIAGNOSIS — C9211 Chronic myeloid leukemia, BCR/ABL-positive, in remission: Secondary | ICD-10-CM

## 2016-01-24 DIAGNOSIS — Z79899 Other long term (current) drug therapy: Secondary | ICD-10-CM

## 2016-01-24 LAB — COMPREHENSIVE METABOLIC PANEL
ALBUMIN: 4.2 g/dL (ref 3.5–5.0)
ALT: 23 U/L (ref 14–54)
AST: 26 U/L (ref 15–41)
Alkaline Phosphatase: 113 U/L (ref 38–126)
Anion gap: 9 (ref 5–15)
BILIRUBIN TOTAL: 1.6 mg/dL — AB (ref 0.3–1.2)
BUN: 12 mg/dL (ref 6–20)
CHLORIDE: 103 mmol/L (ref 101–111)
CO2: 28 mmol/L (ref 22–32)
Calcium: 9.9 mg/dL (ref 8.9–10.3)
Creatinine, Ser: 1.29 mg/dL — ABNORMAL HIGH (ref 0.44–1.00)
GFR calc Af Amer: 47 mL/min — ABNORMAL LOW (ref >60)
GFR calc non Af Amer: 41 mL/min — ABNORMAL LOW (ref >60)
GLUCOSE: 154 mg/dL — AB (ref 65–99)
POTASSIUM: 3.2 mmol/L — AB (ref 3.5–5.1)
Sodium: 140 mmol/L (ref 135–145)
Total Protein: 7.8 g/dL (ref 6.5–8.1)

## 2016-01-24 LAB — CBC WITH DIFFERENTIAL/PLATELET
BASOS ABS: 0 10*3/uL (ref 0.0–0.1)
BASOS PCT: 1 %
Eosinophils Absolute: 0.1 10*3/uL (ref 0.0–0.7)
Eosinophils Relative: 1 %
HEMATOCRIT: 37.3 % (ref 36.0–46.0)
Hemoglobin: 12.2 g/dL (ref 12.0–15.0)
Lymphocytes Relative: 35 %
Lymphs Abs: 2.6 10*3/uL (ref 0.7–4.0)
MCH: 28 pg (ref 26.0–34.0)
MCHC: 32.7 g/dL (ref 30.0–36.0)
MCV: 85.6 fL (ref 78.0–100.0)
MONO ABS: 0.7 10*3/uL (ref 0.1–1.0)
Monocytes Relative: 9 %
NEUTROS ABS: 4 10*3/uL (ref 1.7–7.7)
Neutrophils Relative %: 54 %
PLATELETS: 275 10*3/uL (ref 150–400)
RBC: 4.36 MIL/uL (ref 3.87–5.11)
RDW: 14.5 % (ref 11.5–15.5)
WBC: 7.3 10*3/uL (ref 4.0–10.5)

## 2016-01-24 NOTE — Progress Notes (Signed)
[facility] at [place] NOTE  Patient Care Team: Redmond School, MD as PCP - General (Internal Medicine)  CHIEF COMPLAINTS/PURPOSE OF CONSULTATION:  CML    Chronic myelogenous leukemia (CML), BCR-ABL1-positive ([place])   06/01/2014 Tumor Marker    Results for April Page, April Page (MRN [id number]) as of 12/02/2014 09:30  06/01/2014 09:59 BCR ABL1 / ABL1 IS: 71.220 (H)       07/14/2014 Bone Marrow Biopsy    Bone Marrow, Aspirate,Biopsy, and Clot, right iliac - HYPERCELLULAR BONE MARROW WITH CHRONIC MYELOGENOUS LEUKEMIA. - SEE COMMENT. PERIPHERAL BLOOD: - CHRONIC MYELOGENOUS LEUKEMIA. Diagnosis Note Previous PCR analysis performed for bcr-abl1 is report      07/21/2014 - 08/04/2014 Chemotherapy    Tasigna 300 mg BID      08/04/2014 Miscellaneous    Ran out of 2 week sample secondary to issues with insurance approval.      08/15/2014 -  Chemotherapy    Tasigna 300 mg BID      09/17/2014 Tumor Marker    b3a2 transcript % 4.122       12/02/2014 Tumor Marker    b3a2 transcript: 0.046       03/31/2015 Tumor Marker    b3a2 transcript:  <0.001 %         03/31/2015 Remission    Complete molecular response, no detectable BCR/ABL using PCR assay        HISTORY OF PRESENTING ILLNESS:  April Page 70 y.o. female is here because of CML. Interestingly she was seen and followed by Dr.Neijstrom for CML. Per records she was on [place] and it was felt after several years she had a Gleevec failure. She was then switched to newer generation TKI, she states she could not afford the co-pay and therefore simply stop taking the medication and coming to her hematologist. She is now on [place] and doing well.  April Page returns to the [place] alone today. She says she feels good today.   I have reviewed the labs with the patient.   She says that her appetite isn't great. Sometimes it's good, sometimes it's not. She says that it is better than initially, though. She  hasn't eaten today because she "didn't have a taste for it". She does attribute this to her medication. She notes that her weight has stabilized overall, she has adjusted to the change in her diet and appetite.   She says that her energy is "pretty good, I think".   She denies chest pain.   She says that she has occasional constipation. She denies diarrhea or blood in her stool.  No fluid retention. No SOB.    MEDICAL HISTORY:  Past Medical History:  Diagnosis Date  . Arthritis    stiff knees  . Blood dyscrasia   . Cancer ([place])   . GERD (gastroesophageal reflux disease)   . H/O: CML (chronic myeloid leukemia)   . Hyperlipidemia   . Hypertension   . Obesity   . Pessary maintenance 07/01/2013    SURGICAL HISTORY: Past Surgical History:  Procedure Laterality Date  . ABDOMINAL HYSTERECTOMY    . CHOLECYSTECTOMY    . COLONOSCOPY  06/09/2002   [id number] hemorrhoids; otherwise normal rectum, colon   . COLONOSCOPY N/A 08/25/2013   PJK:DTOIZTI diverticulosis. Single colonic polyp-removed  s/p segmental biopsy and stool sample. random colon bx negative. +benign leiomyoma.  . cystocele/rectocele repair  2009  . ESOPHAGOGASTRODUODENOSCOPY  06/09/2002   WPY:[id number] upper gastrointestinal tract s/p  54-French  Maloney dilator  . HERNIA REPAIR    . OOPHORECTOMY      SOCIAL HISTORY: Social History   Social History  . Marital status: Widowed    Spouse name: N/A  . Number of children: 6  . Years of education: associate   Occupational History  .  Retired   Social History Main Topics  . Smoking status: Never Smoker  . Smokeless tobacco: Never Used  . Alcohol use No  . Drug use: No  . Sexual activity: Not Currently    Birth control/ protection: Surgical     Comment: hyst   Other Topics Concern  . Not on file   Social History Narrative  . No narrative on file    FAMILY HISTORY: Family History  Problem Relation Age of Onset  . Anuerysm Mother     deceased age 1,  brain anuerysm  . Early death Mother 86  . Heart disease Father   . Hypertension Sister   . Obesity Brother   . Hypertension Sister   . Arthritis Sister   . Heart disease Child 19    cardiac arrest  . Colon cancer Neg Hx    indicated that her mother is deceased. She indicated that her father is deceased. She indicated that all of her three sisters are alive. She indicated that her brother is alive. She indicated that her maternal grandmother is deceased. She indicated that her maternal grandfather is deceased. She indicated that her paternal grandmother is deceased. She indicated that her paternal grandfather is deceased. She indicated that all of her four daughters are alive. She indicated that only one of her two sons is alive. She indicated that one of her two childs is deceased. She indicated that the status of her neg hx is unknown.    ALLERGIES:  has No Known Allergies.  MEDICATIONS:  Current Outpatient Prescriptions  Medication Sig Dispense Refill  . hydrALAZINE (APRESOLINE) 50 MG tablet Take 50 mg by mouth.     . lactase (LACTAID) 3000 UNITS tablet Take 3,000 Units by mouth as needed (uses when eating dairy products).    Marland Kitchen lisinopril (PRINIVIL,ZESTRIL) 10 MG tablet Take 1 tablet (10 mg total) by mouth daily. 30 tablet 3  . losartan (COZAAR) 100 MG tablet Take 100 mg by mouth daily. Reported on 05/04/2015    . metFORMIN (GLUCOPHAGE-XR) 500 MG 24 hr tablet Take 500 mg by mouth daily with breakfast. Reported on 09/08/2015    . Multiple Vitamin (MULTIVITAMIN WITH MINERALS) TABS tablet Take 1 tablet by mouth daily.    . naproxen sodium (ANAPROX) 220 MG tablet Take 220 mg by mouth 2 (two) times daily as needed (for pain).    . nilotinib (TASIGNA) 150 MG capsule Take 2 capsules (300 mg total) by mouth every 12 (twelve) hours. 120 capsule 6  . Pyridoxine HCl (VITAMIN B-6 PO) Take by mouth once a week. Reported on 09/08/2015     No current facility-administered medications for this visit.      Review of Systems  Constitutional: Negative.  Negative for malaise/fatigue.       Bad appetite.   HENT: Negative.   Eyes: Negative.   Respiratory: Negative.   Cardiovascular: Negative.  Negative for chest pain.  Gastrointestinal: Positive for constipation (occasional). Negative for blood in stool and diarrhea.  Genitourinary: Negative.   Musculoskeletal: Negative.   Skin: Negative.   Neurological: Negative.   Endo/Heme/Allergies: Negative.   Psychiatric/Behavioral: Negative.   All other systems reviewed and are negative. [place]  point ROS was done and is otherwise as detailed above or in HPI   PHYSICAL EXAMINATION: ECOG PERFORMANCE STATUS: 1 - Symptomatic but completely ambulatory  Vitals:   01/24/16 1334  BP: (!) 183/81  Pulse: 71  Resp: 16  Temp: 97.9 F (36.6 C)   Filed Weights   01/24/16 1334  Weight: 166 lb 6.4 oz (75.5 kg)     Physical Exam  Constitutional: She is oriented to person, place, and time and well-developed, well-nourished, and in no distress.  Patient was able to get on exam table without assistance.   HENT:  Head: Normocephalic and atraumatic.  Mouth/Throat: No oropharyngeal exudate.  Eyes: EOM are normal. Pupils are equal, round, and reactive to light. No scleral icterus.  Neck: Normal range of motion. Neck supple.  Cardiovascular: Normal rate, regular rhythm and normal heart sounds.   Pulmonary/Chest: Effort normal and breath sounds normal. No respiratory distress.  Abdominal: Soft. Bowel sounds are normal. She exhibits no distension and no mass. There is no tenderness. There is no rebound and no guarding.  Musculoskeletal: Normal range of motion.  Lymphadenopathy:    She has no cervical adenopathy.  Neurological: She is alert and oriented to person, place, and time. Gait normal.  Skin: Skin is warm and dry.  Psychiatric: Mood, memory, affect and judgment normal.  Nursing note and vitals reviewed.    LABORATORY DATA:  I have reviewed the  data as listed  Bone Marrow, Aspirate,Biopsy, and Clot, right iliac - HYPERCELLULAR BONE MARROW WITH CHRONIC MYELOGENOUS LEUKEMIA. - SEE COMMENT. PERIPHERAL BLOOD: - CHRONIC MYELOGENOUS LEUKEMIA. 3% Blasts  Results for April Page, April Page (MRN [id number]) as of 01/24/2016 12:34  Ref. Range 01/24/2016 11:00  Sodium Latest Ref Range: 135 - 145 mmol/L 140  Potassium Latest Ref Range: 3.5 - 5.1 mmol/L 3.2 (L)  Chloride Latest Ref Range: 101 - 111 mmol/L 103  CO2 Latest Ref Range: 22 - 32 mmol/L 28  BUN Latest Ref Range: 6 - 20 mg/dL 12  Creatinine Latest Ref Range: 0.44 - 1.00 mg/dL 1.29 (H)  Calcium Latest Ref Range: 8.9 - 10.3 mg/dL 9.9  EGFR (Non-African Amer.) Latest Ref Range: >60 mL/min 41 (L)  EGFR (African American) Latest Ref Range: >60 mL/min 47 (L)  Glucose Latest Ref Range: 65 - 99 mg/dL 154 (H)  Anion gap Latest Ref Range: 5 - 15  9  Alkaline Phosphatase Latest Ref Range: 38 - 126 U/L 113  Albumin Latest Ref Range: 3.5 - 5.0 g/dL 4.2  AST Latest Ref Range: 15 - 41 U/L 26  ALT Latest Ref Range: 14 - 54 U/L 23  Total Protein Latest Ref Range: 6.5 - 8.1 g/dL 7.8  Total Bilirubin Latest Ref Range: 0.3 - 1.2 mg/dL 1.6 (H)  WBC Latest Ref Range: 4.0 - 10.5 K/uL 7.3  RBC Latest Ref Range: 3.87 - 5.11 MIL/uL 4.36  Hemoglobin Latest Ref Range: 12.0 - 15.0 g/dL 12.2  HCT Latest Ref Range: 36.0 - 46.0 % 37.3  MCV Latest Ref Range: 78.0 - 100.0 fL 85.6  MCH Latest Ref Range: 26.0 - 34.0 pg 28.0  MCHC Latest Ref Range: 30.0 - 36.0 g/dL 32.7  RDW Latest Ref Range: 11.5 - 15.5 % 14.5  Platelets Latest Ref Range: 150 - 400 K/uL 275  Neutrophils Latest Units: % 54  Lymphocytes Latest Units: % 35  Monocytes Relative Latest Units: % 9  Eosinophil Latest Units: % 1  Basophil Latest Units: % 1  NEUT# Latest Ref Range: 1.7 - 7.7  K/uL 4.0  Lymphocyte # Latest Ref Range: 0.7 - 4.0 K/uL 2.6  Monocyte # Latest Ref Range: 0.1 - 1.0 K/uL 0.7  Eosinophils Absolute Latest Ref Range: 0.0 - 0.7  K/uL 0.1  Basophils Absolute Latest Ref Range: 0.0 - 0.1 K/uL 0.0      Diagnosis Note Previous PCR analysis performed for bcr-abl1 is reportedly positive. Given the latter and the overall morphologic changes in the bone nmarrow, the findings are consistent with chronic myelogenous leukemia. Clinical correlation is Recommended.  ASSESSMENT & PLAN:  CML originally diagnosed in 2010 Off therapy for several years BMBX on 07/14/2014 with chronic phase CML History of Gleevec failure Tasigna 176m po bid Weight loss, now stabilized Complete Molecular Response hypokalemia  She is taking tasigna regularly. She denies missing any doses. She has a MMR.  She is doing well on current therapy. Activity is back at her prior baseline.  She continues to follow with her PCP in regards to her BP and other well care.   We will see her back in 3 months for ongoing surveillance.  She does not need any refills today.  Will recheck potassium in a few weeks, will also add phosphorus.  Monitoring Response to TKI therapy  Bone marrow cytogenetics: at diagnosis, failure to reach response milestones and any sign of loss of response (defined as hematologic or cytogenetic relapse)  Quantitative RT-PCR using is preferred: at diagnosis, every 3 months after initiating treatment, after BCR-ABL1 0.1% - , 1% (IS) has been achieved, every 3 months for 2 years and every 3 - 6 months thereafter. If there is 1 log increase in BCR-ABL1 transcript levels with MMR, QPCR should be repeated in 1 - 3 months  BCR-ABL kinase domain mutation analysis: Chronic phase: failure to reach response milestones, any sign of loss of response (defined as hematologic or cytogenetic relapse) 1 - log increase in BCR-ABL1 transcript levels and loss of MMR. Disease progression to accelerated or blast phase  Criteria for Hematologic and molecular response   Complete Hematologic response Complete normalization of peripheral blood counts with  leukocyte count ,10x103/L Platelet count < 450 x 109/L NO immature cells such as myelocytes, promyelocytes or blasts in peripheral blood No signs and symptoms of disease with disappearance of palpable splenomegaly  Molecular response Early molecular response  BCR-ABL1<= 10% (IS) at 3 and 6 months Major molecular response BCR-ABL1,0.1%  (IS) or > = 3 log reduction in BCR-ABL18mA from the standardized baseline  Complete molecular response no detectable BCR-ABL1m31m using a QPCR assay with a sensitivity of at least 4.5 logs below the standardized baseline  Orders Placed This Encounter  Procedures  . CBC with Differential    Standing Status:   Future    Standing Expiration Date:   01/23/2017  . Comprehensive metabolic panel    Standing Status:   Future    Standing Expiration Date:   01/23/2017  . BCR-ABL1, CML/ALL, PCR, QUANT    Standing Status:   Future    Standing Expiration Date:   01/23/2017    All questions were answered. The patient knows to call the clinic with any problems, questions or concerns. This note was electronically signed.    This document serves as a record of services personally performed by ShaAncil LinseyD. It was created on her behalf by [email or web address], a trained medical scribe. The creation of this record is based on the scribe's personal observations and the provider's statements to them. This document has been checked and approved by  the attending provider.  I have reviewed the above documentation for accuracy and completeness, and I agree with the above.  Kelby Fam. Whitney Muse, MD

## 2016-01-24 NOTE — Patient Instructions (Signed)
[facility] at [facility] Discharge Instructions  RECOMMENDATIONS MADE BY THE CONSULTANT AND ANY TEST RESULTS WILL BE SENT TO YOUR REFERRING PHYSICIAN.  You saw Dr.Penland today. Follow up in 4 months with labs. See Amy at checkout for appointments.  Thank you for choosing [facility] at [facility] to provide your oncology and hematology care.  To afford each patient quality time with our provider, please arrive at least 15 minutes before your scheduled appointment time.   Beginning January 23rd 2017 lab work for the Cancer Center will be done in the  Main lab at [facility] on 1st floor. If you have a lab appointment with the Cancer Center please come in thru the  Main Entrance and check in at the main information desk  You need to re-schedule your appointment should you arrive 10 or more minutes late.  We strive to give you quality time with our providers, and arriving late affects you and other patients whose appointments are after yours.  Also, if you no show three or more times for appointments you may be dismissed from the clinic at the providers discretion.     Again, thank you for choosing [facility].  Our hope is that these requests will decrease the amount of time that you wait before being seen by our physicians.       _____________________________________________________________  Should you have questions after your visit to [facility], please contact our office at [phone] between the hours of 8:30 a.m. and 4:30 p.m.  Voicemails left after 4:30 p.m. will not be returned until the following business day.  For prescription refill requests, have your pharmacy contact our office.         Resources For Cancer Patients and their Caregivers ? American Cancer Society: Can assist with transportation, wigs, general needs, runs Look Good Feel Better.        1-888-227-6333 ? Cancer Care: Provides financial  assistance, online support groups, medication/co-pay assistance.  1-800-813-HOPE (4673) ? [facility] Assists [place] cancer patients and their families through emotional , educational and financial support.  336-427-4357 ? [place] DSS Where to apply for food stamps, Medicaid and utility assistance. 336-342-1394 ? [facility]: Transportation to medical appointments. 336-347-2287 ? Social Security Administration: May apply for disability if have a Stage IV cancer. 336-342-7796 1-800-772-1213 ? [place] Aging, Disability and Transit Services: Assists with nutrition, care and transit needs. 336-349-2343  Cancer Center Support Programs: @10RELATIVEDAYS@ > Cancer Support Group  2nd Tuesday of the month 1pm-2pm, Journey Room  > Creative Journey  3rd Tuesday of the month 1130am-1pm, Journey Room  > Look Good Feel Better  1st Wednesday of the month 10am-12 noon, Journey Room (Call American Cancer Society to register 1-800-395-5775)    

## 2016-03-01 ENCOUNTER — Encounter (HOSPITAL_COMMUNITY): Payer: Self-pay | Admitting: Hematology & Oncology

## 2016-03-05 ENCOUNTER — Telehealth (HOSPITAL_COMMUNITY): Payer: Self-pay | Admitting: *Deleted

## 2016-03-05 DIAGNOSIS — C9211 Chronic myeloid leukemia, BCR/ABL-positive, in remission: Secondary | ICD-10-CM

## 2016-03-05 NOTE — Telephone Encounter (Signed)
-----   Message from Patrici Ranks, MD sent at 03/01/2016  8:32 PM EST -----  Have patient come in for repeat K and phosphorus. Dr.P

## 2016-03-05 NOTE — Telephone Encounter (Signed)
LMOM for pt to call us back to schedule repeat labs.

## 2016-03-13 ENCOUNTER — Ambulatory Visit: Payer: Commercial Managed Care - HMO | Admitting: Adult Health

## 2016-04-04 ENCOUNTER — Encounter: Payer: Self-pay | Admitting: Adult Health

## 2016-04-04 ENCOUNTER — Ambulatory Visit (INDEPENDENT_AMBULATORY_CARE_PROVIDER_SITE_OTHER): Payer: Medicare HMO | Admitting: Adult Health

## 2016-04-04 VITALS — BP 160/90 | HR 78 | Ht 64.0 in | Wt 158.0 lb

## 2016-04-04 DIAGNOSIS — N813 Complete uterovaginal prolapse: Secondary | ICD-10-CM

## 2016-04-04 DIAGNOSIS — Z4689 Encounter for fitting and adjustment of other specified devices: Secondary | ICD-10-CM

## 2016-04-04 NOTE — Progress Notes (Signed)
Subjective:     Patient ID: April Page, female   DOB: 1945-11-20, 71 y.o.   MRN: [id number]  HPI April Page is a 71 year old black female in for a pessary cleaning, she has no complaints, no itching or burning or vaginal irritation.Today is her birthday.  Review of Systems For pessary cleaning, no itching or burning or vaginal irritaion Reviewed past medical,surgical, social and family history. Reviewed medications and allergies.     Objective:   Physical Exam BP (!) 160/90 (BP Location: Left Arm, Patient Position: Sitting, Cuff Size: Normal)   Pulse 78   Ht 5\' 4"  (1.626 m)   Wt 158 lb (71.7 kg)   BMI 27.12 kg/m PHQ 2 score 0.   Skin warm and dry.Pelvic: external genitalia is normal in appearance no lesions, vagina: pessary removed and cleaned with soap and water, no lesions no rubbing irritation noted,pessary reinserted,urethra has no lesions or masses noted, cervix and uterus are absent.  Assessment:     1. Pessary maintenance       Plan:     Follow up in 6 months for cleaning or before if needed

## 2016-04-09 ENCOUNTER — Telehealth (HOSPITAL_COMMUNITY): Payer: Self-pay | Admitting: Oncology

## 2016-04-09 NOTE — Telephone Encounter (Signed)
PC TO [facility] HOTLINE [phone] TO SEE WHAT HELP THEY OFFER THE PT. PER KRISTAL PT IS ELIGIBLE FOR EXTRA HELP/MCR. THEY WILL HELP PT APPLY AND IF SHE IS DENIED THEY WILL CONTINUE TO GIVE FREE TASIGNA. CALLED PT TO GIVE HER THE INFO. [place]

## 2016-04-11 ENCOUNTER — Encounter (HOSPITAL_COMMUNITY): Payer: Self-pay | Admitting: *Deleted

## 2016-04-11 ENCOUNTER — Emergency Department (HOSPITAL_COMMUNITY): Payer: Medicare HMO

## 2016-04-11 ENCOUNTER — Inpatient Hospital Stay (HOSPITAL_COMMUNITY)
Admission: EM | Admit: 2016-04-11 | Discharge: 2016-04-15 | DRG: 481 | Disposition: A | Discharge status: Home Health | Payer: Medicare HMO | Attending: Internal Medicine | Admitting: Internal Medicine

## 2016-04-11 DIAGNOSIS — M62838 Other muscle spasm: Secondary | ICD-10-CM | POA: Diagnosis not present

## 2016-04-11 DIAGNOSIS — E1165 Type 2 diabetes mellitus with hyperglycemia: Secondary | ICD-10-CM | POA: Diagnosis present

## 2016-04-11 DIAGNOSIS — D649 Anemia, unspecified: Secondary | ICD-10-CM | POA: Diagnosis not present

## 2016-04-11 DIAGNOSIS — K219 Gastro-esophageal reflux disease without esophagitis: Secondary | ICD-10-CM | POA: Diagnosis present

## 2016-04-11 DIAGNOSIS — R262 Difficulty in walking, not elsewhere classified: Secondary | ICD-10-CM

## 2016-04-11 DIAGNOSIS — Z8781 Personal history of (healed) traumatic fracture: Secondary | ICD-10-CM

## 2016-04-11 DIAGNOSIS — R739 Hyperglycemia, unspecified: Secondary | ICD-10-CM | POA: Diagnosis not present

## 2016-04-11 DIAGNOSIS — I10 Essential (primary) hypertension: Secondary | ICD-10-CM | POA: Diagnosis present

## 2016-04-11 DIAGNOSIS — Z6827 Body mass index (BMI) 27.0-27.9, adult: Secondary | ICD-10-CM

## 2016-04-11 DIAGNOSIS — S72141A Displaced intertrochanteric fracture of right femur, initial encounter for closed fracture: Principal | ICD-10-CM | POA: Diagnosis present

## 2016-04-11 DIAGNOSIS — M25551 Pain in right hip: Secondary | ICD-10-CM | POA: Diagnosis present

## 2016-04-11 DIAGNOSIS — E785 Hyperlipidemia, unspecified: Secondary | ICD-10-CM | POA: Diagnosis present

## 2016-04-11 DIAGNOSIS — Z8249 Family history of ischemic heart disease and other diseases of the circulatory system: Secondary | ICD-10-CM | POA: Diagnosis not present

## 2016-04-11 DIAGNOSIS — M79604 Pain in right leg: Secondary | ICD-10-CM | POA: Diagnosis not present

## 2016-04-11 DIAGNOSIS — C9210 Chronic myeloid leukemia, BCR/ABL-positive, not having achieved remission: Secondary | ICD-10-CM | POA: Diagnosis present

## 2016-04-11 DIAGNOSIS — E876 Hypokalemia: Secondary | ICD-10-CM | POA: Diagnosis present

## 2016-04-11 DIAGNOSIS — R55 Syncope and collapse: Secondary | ICD-10-CM | POA: Diagnosis not present

## 2016-04-11 DIAGNOSIS — E669 Obesity, unspecified: Secondary | ICD-10-CM | POA: Diagnosis present

## 2016-04-11 DIAGNOSIS — S79911A Unspecified injury of right hip, initial encounter: Secondary | ICD-10-CM | POA: Diagnosis not present

## 2016-04-11 DIAGNOSIS — Z9221 Personal history of antineoplastic chemotherapy: Secondary | ICD-10-CM

## 2016-04-11 DIAGNOSIS — W19XXXA Unspecified fall, initial encounter: Secondary | ICD-10-CM | POA: Diagnosis present

## 2016-04-11 DIAGNOSIS — R197 Diarrhea, unspecified: Secondary | ICD-10-CM | POA: Diagnosis not present

## 2016-04-11 DIAGNOSIS — S72001A Fracture of unspecified part of neck of right femur, initial encounter for closed fracture: Secondary | ICD-10-CM

## 2016-04-11 DIAGNOSIS — S72009A Fracture of unspecified part of neck of unspecified femur, initial encounter for closed fracture: Secondary | ICD-10-CM

## 2016-04-11 DIAGNOSIS — S299XXA Unspecified injury of thorax, initial encounter: Secondary | ICD-10-CM | POA: Diagnosis not present

## 2016-04-11 DIAGNOSIS — IMO0002 Reserved for concepts with insufficient information to code with codable children: Secondary | ICD-10-CM | POA: Diagnosis present

## 2016-04-11 DIAGNOSIS — R42 Dizziness and giddiness: Secondary | ICD-10-CM

## 2016-04-11 LAB — CBC WITH DIFFERENTIAL/PLATELET
BASOS ABS: 0 10*3/uL (ref 0.0–0.1)
Basophils Relative: 0 %
EOS PCT: 0 %
Eosinophils Absolute: 0 10*3/uL (ref 0.0–0.7)
HEMATOCRIT: 37 % (ref 36.0–46.0)
Hemoglobin: 12.4 g/dL (ref 12.0–15.0)
LYMPHS ABS: 1.1 10*3/uL (ref 0.7–4.0)
LYMPHS PCT: 11 %
MCH: 27.9 pg (ref 26.0–34.0)
MCHC: 33.5 g/dL (ref 30.0–36.0)
MCV: 83.1 fL (ref 78.0–100.0)
MONO ABS: 0.7 10*3/uL (ref 0.1–1.0)
MONOS PCT: 7 %
NEUTROS ABS: 8 10*3/uL — AB (ref 1.7–7.7)
Neutrophils Relative %: 82 %
Platelets: 237 10*3/uL (ref 150–400)
RBC: 4.45 MIL/uL (ref 3.87–5.11)
RDW: 15 % (ref 11.5–15.5)
WBC: 9.9 10*3/uL (ref 4.0–10.5)

## 2016-04-11 LAB — BASIC METABOLIC PANEL
ANION GAP: 11 (ref 5–15)
BUN: 17 mg/dL (ref 6–20)
CALCIUM: 9.8 mg/dL (ref 8.9–10.3)
CO2: 24 mmol/L (ref 22–32)
Chloride: 105 mmol/L (ref 101–111)
Creatinine, Ser: 0.96 mg/dL (ref 0.44–1.00)
GFR calc Af Amer: >60 mL/min (ref >60)
GFR calc non Af Amer: 58 mL/min — ABNORMAL LOW (ref >60)
GLUCOSE: 213 mg/dL — AB (ref 65–99)
Potassium: 3.4 mmol/L — ABNORMAL LOW (ref 3.5–5.1)
Sodium: 140 mmol/L (ref 135–145)

## 2016-04-11 LAB — CK: Total CK: 197 U/L (ref 38–234)

## 2016-04-11 MED ORDER — SODIUM CHLORIDE 0.9 % IV BOLUS (SEPSIS)
1000.0000 mL | Freq: Once | INTRAVENOUS | Status: AC
  Administered 2016-04-11: 1000 mL via INTRAVENOUS

## 2016-04-11 MED ORDER — FENTANYL CITRATE (PF) 100 MCG/2ML IJ SOLN
100.0000 ug | Freq: Once | INTRAMUSCULAR | Status: AC
  Administered 2016-04-11: 100 ug via INTRAVENOUS
  Filled 2016-04-11: qty 2

## 2016-04-11 NOTE — ED Triage Notes (Signed)
Pt fell at 0500 this morning and unable to reach a phone.  Family member came in and found pt. C/o knee pain.  States that she got dizzy when asked how she fell.

## 2016-04-11 NOTE — ED Provider Notes (Signed)
[facility] DEPT Provider Note   CSN: [id number] Arrival date & time: 04/11/16  2013  By signing my name below, I, [name], attest that this documentation has been prepared under the direction and in the presence of Orlie Dakin, MD . Electronically Signed: [name], ED Scribe. 04/11/2016. 9:19 PM.    History   Chief Complaint Chief Complaint  Patient presents with  . Fall    The history is provided by the patient. No language interpreter was used.   HPI Comments:  April Page is a 71 y.o. female with PMHx of leukemia and HTN who presents to the Emergency Department via EMS, here due to an unwitnessed fall that occurred at 0500 today. Pt reports that she was unable to reach her phone, and she was found on the ground by a family member 93 hours later. Family reports that EMS instructed them not to move the patient after they found her on the floor. She states that she was unable to get up due to pain on her right Knee and right thigh after the fall. She states that she suddenly got lightheaded when she stood up which caused her to fall. Pt has not had anything to eat or drink today, but states that she is not hungry at this time. Pt does not smoke or drink EtOH. No alleviating factors noted. Pt denies pain elsewhere. Pt denies cough, fever.   Past Medical History:  Diagnosis Date  . Arthritis    stiff knees  . Blood dyscrasia   . Cancer ([place])   . GERD (gastroesophageal reflux disease)   . H/O: CML (chronic myeloid leukemia)   . Hyperlipidemia   . Hypertension   . Obesity   . Pessary maintenance 07/01/2013    Patient Active Problem List   Diagnosis Date Noted  . Itching in the vaginal area 10/05/2014  . Chronic myelogenous leukemia (CML), BCR-ABL1-positive ([place])   . GERD (gastroesophageal reflux disease) 12/06/2013  . Leukocytosis 08/06/2013  . Diabetes type 2, uncontrolled ([place]) 08/06/2013  . Atypical chest pain 08/06/2013  . Chronic diarrhea  08/05/2013  . Pessary maintenance 07/01/2013    Past Surgical History:  Procedure Laterality Date  . ABDOMINAL HYSTERECTOMY    . CHOLECYSTECTOMY    . COLONOSCOPY  06/09/2002   MGQ:QPYPPJKD hemorrhoids; otherwise normal rectum, colon   . COLONOSCOPY N/A 08/25/2013   TOI:ZTIWPYK diverticulosis. Single colonic polyp-removed  s/p segmental biopsy and stool sample. random colon bx negative. +benign leiomyoma.  . cystocele/rectocele repair  2009  . ESOPHAGOGASTRODUODENOSCOPY  06/09/2002   DXI:PJASNK upper gastrointestinal tract s/p  54-French Maloney dilator  . HERNIA REPAIR    . OOPHORECTOMY      OB History    Gravida Para Term Preterm AB Living   6 6       5    SAB TAB Ectopic Multiple Live Births                   Home Medications    Prior to Admission medications   Medication Sig Start Date End Date Taking? Authorizing Provider  lactase (LACTAID) 3000 UNITS tablet Take 3,000 Units by mouth as needed (uses when eating dairy products).    Historical Provider, MD  lisinopril (PRINIVIL,ZESTRIL) 10 MG tablet Take 1 tablet (10 mg total) by mouth daily. 01/17/15   Baird Cancer, PA-C  Multiple Vitamin (MULTIVITAMIN WITH MINERALS) TABS tablet Take 1 tablet by mouth daily.    Historical Provider, MD  nilotinib (TASIGNA) 150  MG capsule Take 2 capsules (300 mg total) by mouth every 12 (twelve) hours. 09/05/15   Baird Cancer, PA-C    Family History Family History  Problem Relation Age of Onset  . Anuerysm Mother     deceased age 42, brain anuerysm  . Early death Mother 4  . Heart disease Father   . Hypertension Sister   . Obesity Brother   . Hypertension Sister   . Arthritis Sister   . Other Son     cardiac arrest  . Heart disease Child 30    cardiac arrest  . Colon cancer Neg Hx     Social History Social History  Substance Use Topics  . Smoking status: Never Smoker  . Smokeless tobacco: Never Used  . Alcohol use No     Allergies   Patient has no known  allergies.   Review of Systems Review of Systems  Constitutional: Negative for fever.  Respiratory: Negative for cough.   Musculoskeletal: Positive for arthralgias.  Allergic/Immunologic: Positive for immunocompromised state.       Leukemia  Neurological: Positive for light-headedness.  All other systems reviewed and are negative.    Physical Exam Updated Vital Signs BP 180/82   Pulse 75   Temp 98.8 F (37.1 C) (Oral)   Resp 22   Ht 5\' 4"  (1.626 m)   Wt 158 lb (71.7 kg)   SpO2 98%   BMI 27.12 kg/m   Physical Exam  Constitutional: She appears well-developed and well-nourished.  HENT:  Head: Normocephalic and atraumatic.  Eyes: Conjunctivae are normal. Pupils are equal, round, and reactive to light.  Neck: Neck supple. No tracheal deviation present. No thyromegaly present.  Cardiovascular: Normal rate and regular rhythm.   No murmur heard. Pulmonary/Chest: Effort normal and breath sounds normal.  Abdominal: Soft. Bowel sounds are normal. She exhibits no distension. There is no tenderness.  Musculoskeletal: Normal range of motion. She exhibits no edema or tenderness.  Spine nontender. Pelvis stable. Right lower extremity shortened and externally  Rotated. She has tenderness at hip and pain at hip on internal rotation of thigh. All other extremities without contusion abrasion or tenderness neurovascularly intact  Neurological: She is alert. Coordination normal.  Skin: Skin is warm and dry. No rash noted.  Psychiatric: She has a normal mood and affect.  Nursing note and vitals reviewed.    ED Treatments / Results  DIAGNOSTIC STUDIES:  Oxygen Saturation is 98% on RA, normal by my interpretation.    COORDINATION OF CARE:  9:17 PM Discussed treatment plan with pt at bedside and pt agreed to plan.   Labs (all labs ordered are listed, but only abnormal results are displayed) Labs Reviewed - No data to display  EKG  EKG Interpretation  Date/Time:  Thursday  April 11 2016 20:50:54 EST Ventricular Rate:  75 PR Interval:    QRS Duration: 94 QT Interval:  433 QTC Calculation: 484 R Axis:   -8 Text Interpretation:  Sinus rhythm Abnormal R-wave progression, early transition ST elevation, consider inferior injury Baseline wander in lead(s) II III aVF Poor data quality Confirmed by Winfred Leeds  MD, [place] D[place] [id number]) on 04/11/2016 9:11:44 PM      ED ECG REPORT   Date: 04/11/2016  Rate: 70  Rhythm: normal sinus rhythm  QRS Axis: left  Intervals: normal  ST/T Wave abnormalities: nonspecific T wave changes  Conduction Disutrbances:none  Narrative Interpretation:   Old EKG Reviewed: TRacing from earlier today of poor quality  I have personally reviewed  the EKG tracing   Radiology No results found.  Procedures Procedures (including critical care time)  Medications Ordered in ED Medications - No data to display  Results for orders placed or performed during the hospital encounter of 04/11/16  CBC with Differential/Platelet  Result Value Ref Range   WBC 9.9 4.0 - 10.5 K/uL   RBC 4.45 3.87 - 5.11 MIL/uL   Hemoglobin 12.4 12.0 - 15.0 g/dL   HCT 37.0 36.0 - 46.0 %   MCV 83.1 78.0 - 100.0 fL   MCH 27.9 26.0 - 34.0 pg   MCHC 33.5 30.0 - 36.0 g/dL   RDW 15.0 11.5 - 15.5 %   Platelets 237 150 - 400 K/uL   Neutrophils Relative % 82 %   Neutro Abs 8.0 (H) 1.7 - 7.7 K/uL   Lymphocytes Relative 11 %   Lymphs Abs 1.1 0.7 - 4.0 K/uL   Monocytes Relative 7 %   Monocytes Absolute 0.7 0.1 - 1.0 K/uL   Eosinophils Relative 0 %   Eosinophils Absolute 0.0 0.0 - 0.7 K/uL   Basophils Relative 0 %   Basophils Absolute 0.0 0.0 - 0.1 K/uL  Basic metabolic panel  Result Value Ref Range   Sodium 140 135 - 145 mmol/L   Potassium 3.4 (L) 3.5 - 5.1 mmol/L   Chloride 105 101 - 111 mmol/L   CO2 24 22 - 32 mmol/L   Glucose, Bld 213 (H) 65 - 99 mg/dL   BUN 17 6 - 20 mg/dL   Creatinine, Ser 0.96 0.44 - 1.00 mg/dL   Calcium 9.8 8.9 - 10.3 mg/dL   GFR calc non  Af Amer 58 (L) >60 mL/min   GFR calc Af Amer >60 >60 mL/min   Anion gap 11 5 - 15  CK  Result Value Ref Range   Total CK 197 38 - 234 U/L   Dg Chest 1 View  Result Date: 04/11/2016 CLINICAL DATA:  Status post fall, with concern for chest injury. Dizziness. Initial encounter. EXAM: CHEST 1 VIEW COMPARISON:  Chest radiograph performed 03/31/2015 FINDINGS: The lungs are well-aerated and clear. There is no evidence of focal opacification, pleural effusion or pneumothorax. The cardiomediastinal silhouette is mildly enlarged. No acute osseous abnormalities are seen. There is prominence of the superior mediastinum, with rightward deviation of the trachea, raising concern for a superior mediastinal mass. IMPRESSION: 1. No displaced rib fracture seen. 2. Mild cardiomegaly, with prominence of the superior mediastinum. Rightward deviation of the trachea. This raises concern for a superior mediastinal mass, better characterized than on prior studies. CT of the chest with contrast would be helpful for further evaluation, when and as deemed clinically appropriate. Electronically Signed   By: Garald Balding M.D.   On: 04/11/2016 22:04   Ct Hip Right Wo Contrast  Result Date: 04/12/2016 CLINICAL DATA:  Status post fall, with right hip pain. Initial encounter. EXAM: CT OF THE RIGHT HIP WITHOUT CONTRAST TECHNIQUE: Multidetector CT imaging of the right hip was performed according to the standard protocol. Multiplanar CT image reconstructions were also generated. COMPARISON:  Right hip radiographs performed earlier today at 9:29 p.m. FINDINGS: Bones/Joint/Cartilage There is a comminuted right femoral intertrochanteric fracture, with basicervical fracture line, and fracture line extending into the proximal femoral diaphysis. A mildly displaced lesser trochanteric fragment is seen. There is superior joint space narrowing at the right hip, with associated sclerosis and cortical irregularity. Mild subcortical cyst formation is  seen. The cartilage is not well assessed. Ligaments Suboptimally assessed by CT.  Muscles and Tendons Mild soft tissue injury is seen within the musculature about the proximal right femur. No definite focal tendon injuries are seen. Soft tissues Mild soft tissue injury is seen along the posterior aspect of the proximal right thigh. Scattered small and large bowel loops are grossly unremarkable. The bladder is moderately distended and grossly unremarkable. A pelvic ring is noted. IMPRESSION: 1. Comminuted right femoral intertrochanteric fracture, with basicervical fracture line, and fracture line extending into the proximal femoral diaphysis. Mildly displaced lesser trochanteric fragment noted. 2. Superior joint space narrowing at the right hip, with associated sclerosis and cortical irregularity. Mild subcortical cyst formation noted. 3. Mild soft tissue injury within the musculature about the proximal right femur. Mild soft tissue injury along the posterior aspect of the proximal right thigh. Electronically Signed   By: Garald Balding M.D.   On: 04/12/2016 00:12   Dg Hip Unilat With Pelvis 2-3 Views Right  Result Date: 04/11/2016 CLINICAL DATA:  Status post fall, with right hip pain. Initial encounter. EXAM: DG HIP (WITH OR WITHOUT PELVIS) 2-3V RIGHT COMPARISON:  None. FINDINGS: There is no evidence of fracture or dislocation. Both femoral heads are seated normally within their respective acetabula. The proximal right femur appears intact. Mild superior joint space narrowing is noted at the right hip, with mild associated sclerosis. Mild degenerative change is noted at the lower lumbar spine. The visualized bowel gas pattern is grossly unremarkable in appearance. Scattered phleboliths are noted within the pelvis. IMPRESSION: No evidence of fracture or dislocation. Mild superior joint space narrowing at the right hip. Electronically Signed   By: Garald Balding M.D.   On: 04/11/2016 22:02   Initial Impression /  Assessment and Plan / ED Course  I have reviewed the triage vital signs and the nursing notes.  Pertinent labs & imaging results that were available during my care of the patient were reviewed by me and considered in my medical decision making (see chart for details).    12:10 M patient resting comfortably and pain is under control after treatment with intravenous fentanyl X-rays and CT scan viewed by me and discussed with radiologist. Patient is agreeable to transfer to [facility] for hip surgery . Pt signed out to Dr I knapp at 1215 am    Final as well Clinical Impressions(s) / ED Diagnoses   Final diagnoses:  None  Dx #1 Fall #2acute closed right hip fracture #3 hyperglycemia #4 mild hypokalemia New Prescriptions New Prescriptions   No medications on file       Orlie Dakin, MD 04/12/16 [id number]

## 2016-04-11 NOTE — ED Notes (Signed)
Patient transported to X-ray 

## 2016-04-12 ENCOUNTER — Encounter (HOSPITAL_COMMUNITY)
Admission: EM | Disposition: A | Discharge status: Home / Self Care | Payer: Self-pay | Source: Home / Self Care | Attending: Internal Medicine

## 2016-04-12 ENCOUNTER — Inpatient Hospital Stay (HOSPITAL_COMMUNITY): Payer: Medicare HMO

## 2016-04-12 ENCOUNTER — Inpatient Hospital Stay (HOSPITAL_COMMUNITY): Payer: Medicare HMO | Admitting: Certified Registered Nurse Anesthetist

## 2016-04-12 ENCOUNTER — Encounter (HOSPITAL_COMMUNITY): Payer: Self-pay | Admitting: *Deleted

## 2016-04-12 DIAGNOSIS — W19XXXD Unspecified fall, subsequent encounter: Secondary | ICD-10-CM

## 2016-04-12 DIAGNOSIS — K219 Gastro-esophageal reflux disease without esophagitis: Secondary | ICD-10-CM | POA: Diagnosis present

## 2016-04-12 DIAGNOSIS — I10 Essential (primary) hypertension: Secondary | ICD-10-CM | POA: Diagnosis present

## 2016-04-12 DIAGNOSIS — E785 Hyperlipidemia, unspecified: Secondary | ICD-10-CM | POA: Diagnosis present

## 2016-04-12 DIAGNOSIS — R55 Syncope and collapse: Secondary | ICD-10-CM | POA: Diagnosis not present

## 2016-04-12 DIAGNOSIS — S72001A Fracture of unspecified part of neck of right femur, initial encounter for closed fracture: Secondary | ICD-10-CM | POA: Diagnosis not present

## 2016-04-12 DIAGNOSIS — E1165 Type 2 diabetes mellitus with hyperglycemia: Secondary | ICD-10-CM | POA: Diagnosis present

## 2016-04-12 DIAGNOSIS — E669 Obesity, unspecified: Secondary | ICD-10-CM | POA: Diagnosis present

## 2016-04-12 DIAGNOSIS — D649 Anemia, unspecified: Secondary | ICD-10-CM | POA: Diagnosis not present

## 2016-04-12 DIAGNOSIS — M62838 Other muscle spasm: Secondary | ICD-10-CM | POA: Diagnosis not present

## 2016-04-12 DIAGNOSIS — S72009A Fracture of unspecified part of neck of unspecified femur, initial encounter for closed fracture: Secondary | ICD-10-CM | POA: Diagnosis present

## 2016-04-12 DIAGNOSIS — E876 Hypokalemia: Secondary | ICD-10-CM | POA: Diagnosis present

## 2016-04-12 DIAGNOSIS — Z9221 Personal history of antineoplastic chemotherapy: Secondary | ICD-10-CM | POA: Diagnosis not present

## 2016-04-12 DIAGNOSIS — C9210 Chronic myeloid leukemia, BCR/ABL-positive, not having achieved remission: Secondary | ICD-10-CM

## 2016-04-12 DIAGNOSIS — W19XXXA Unspecified fall, initial encounter: Secondary | ICD-10-CM | POA: Diagnosis present

## 2016-04-12 DIAGNOSIS — Z8249 Family history of ischemic heart disease and other diseases of the circulatory system: Secondary | ICD-10-CM | POA: Diagnosis not present

## 2016-04-12 DIAGNOSIS — Z8781 Personal history of (healed) traumatic fracture: Secondary | ICD-10-CM

## 2016-04-12 DIAGNOSIS — S72141A Displaced intertrochanteric fracture of right femur, initial encounter for closed fracture: Secondary | ICD-10-CM | POA: Diagnosis present

## 2016-04-12 DIAGNOSIS — Z6827 Body mass index (BMI) 27.0-27.9, adult: Secondary | ICD-10-CM | POA: Diagnosis not present

## 2016-04-12 DIAGNOSIS — M25551 Pain in right hip: Secondary | ICD-10-CM | POA: Diagnosis present

## 2016-04-12 HISTORY — PX: FEMUR IM NAIL: SHX1597

## 2016-04-12 LAB — PHOSPHORUS: Phosphorus: 2.5 mg/dL (ref 2.5–4.6)

## 2016-04-12 LAB — CBC
HCT: 33.4 % — ABNORMAL LOW (ref 36.0–46.0)
HCT: 34.8 % — ABNORMAL LOW (ref 36.0–46.0)
Hemoglobin: 10.9 g/dL — ABNORMAL LOW (ref 12.0–15.0)
Hemoglobin: 11.3 g/dL — ABNORMAL LOW (ref 12.0–15.0)
MCH: 27.3 pg (ref 26.0–34.0)
MCH: 27.3 pg (ref 26.0–34.0)
MCHC: 32.6 g/dL (ref 30.0–36.0)
MCHC: 32.5 g/dL (ref 30.0–36.0)
MCV: 83.5 fL (ref 78.0–100.0)
MCV: 84.1 fL (ref 78.0–100.0)
PLATELETS: 189 10*3/uL (ref 150–400)
PLATELETS: 191 10*3/uL (ref 150–400)
RBC: 4 MIL/uL (ref 3.87–5.11)
RBC: 4.14 MIL/uL (ref 3.87–5.11)
RDW: 15.1 % (ref 11.5–15.5)
RDW: 15.4 % (ref 11.5–15.5)
WBC: 8.4 10*3/uL (ref 4.0–10.5)
WBC: 9 10*3/uL (ref 4.0–10.5)

## 2016-04-12 LAB — TROPONIN I
Troponin I: <0.03 ng/mL (ref <0.03)
Troponin I: <0.03 ng/mL (ref <0.03)

## 2016-04-12 LAB — GLUCOSE, CAPILLARY
GLUCOSE-CAPILLARY: 177 mg/dL — AB (ref 65–99)
GLUCOSE-CAPILLARY: 220 mg/dL — AB (ref 65–99)
GLUCOSE-CAPILLARY: 238 mg/dL — AB (ref 65–99)
Glucose-Capillary: 218 mg/dL — ABNORMAL HIGH (ref 65–99)
Glucose-Capillary: 192 mg/dL — ABNORMAL HIGH (ref 65–99)
Glucose-Capillary: 174 mg/dL — ABNORMAL HIGH (ref 65–99)

## 2016-04-12 LAB — COMPREHENSIVE METABOLIC PANEL
ALBUMIN: 3.4 g/dL — AB (ref 3.5–5.0)
ALT: 20 U/L (ref 14–54)
AST: 22 U/L (ref 15–41)
Alkaline Phosphatase: 85 U/L (ref 38–126)
Anion gap: 8 (ref 5–15)
BUN: 11 mg/dL (ref 6–20)
CHLORIDE: 109 mmol/L (ref 101–111)
CO2: 23 mmol/L (ref 22–32)
CREATININE: 0.79 mg/dL (ref 0.44–1.00)
Calcium: 9.2 mg/dL (ref 8.9–10.3)
GFR calc Af Amer: >60 mL/min (ref >60)
GLUCOSE: 248 mg/dL — AB (ref 65–99)
Potassium: 3.2 mmol/L — ABNORMAL LOW (ref 3.5–5.1)
Sodium: 140 mmol/L (ref 135–145)
Total Bilirubin: 1.7 mg/dL — ABNORMAL HIGH (ref 0.3–1.2)
Total Protein: 6.7 g/dL (ref 6.5–8.1)

## 2016-04-12 LAB — CREATININE, SERUM
CREATININE: 0.79 mg/dL (ref 0.44–1.00)
CREATININE: 0.86 mg/dL (ref 0.44–1.00)
GFR calc Af Amer: >60 mL/min (ref >60)
GFR calc Af Amer: >60 mL/min (ref >60)
GFR calc non Af Amer: >60 mL/min (ref >60)
GFR calc non Af Amer: >60 mL/min (ref >60)

## 2016-04-12 LAB — SURGICAL PCR SCREEN
MRSA, PCR: NEGATIVE
Staphylococcus aureus: NEGATIVE

## 2016-04-12 LAB — ECHOCARDIOGRAM COMPLETE
Height: 64 in
WEIGHTICAEL: 2528 [oz_av]

## 2016-04-12 SURGERY — INSERTION, INTRAMEDULLARY ROD, FEMUR
Anesthesia: General | Laterality: Right

## 2016-04-12 MED ORDER — ONDANSETRON HCL 4 MG PO TABS: 4.0000 mg | ORAL_TABLET | Freq: Four times a day (QID) | ORAL | Status: DC | PRN

## 2016-04-12 MED ORDER — POTASSIUM CHLORIDE 20 MEQ PO PACK
40.0000 meq | PACK | Freq: Two times a day (BID) | ORAL | Status: DC
  Administered 2016-04-12: 40 meq via ORAL
  Filled 2016-04-12 (×3): qty 2

## 2016-04-12 MED ORDER — FENTANYL CITRATE (PF) 100 MCG/2ML IJ SOLN
INTRAMUSCULAR | Status: AC
  Filled 2016-04-12: qty 4

## 2016-04-12 MED ORDER — ROCURONIUM BROMIDE 100 MG/10ML IV SOLN
INTRAVENOUS | Status: DC | PRN
  Administered 2016-04-12: 50 mg via INTRAVENOUS
  Administered 2016-04-12: 10 mg via INTRAVENOUS

## 2016-04-12 MED ORDER — HYDROMORPHONE HCL 1 MG/ML IJ SOLN
1.0000 mg | INTRAMUSCULAR | Status: DC | PRN
  Administered 2016-04-12: 1 mg via INTRAVENOUS
  Filled 2016-04-12: qty 1

## 2016-04-12 MED ORDER — INSULIN ASPART 100 UNIT/ML ~~LOC~~ SOLN
0.0000 [IU] | SUBCUTANEOUS | Status: DC
  Administered 2016-04-12: 5 [IU] via SUBCUTANEOUS
  Administered 2016-04-12: 3 [IU] via SUBCUTANEOUS
  Administered 2016-04-12: 5 [IU] via SUBCUTANEOUS
  Administered 2016-04-12 – 2016-04-13 (×4): 3 [IU] via SUBCUTANEOUS
  Administered 2016-04-13: 2 [IU] via SUBCUTANEOUS
  Administered 2016-04-13: 5 [IU] via SUBCUTANEOUS
  Administered 2016-04-14: 3 [IU] via SUBCUTANEOUS
  Administered 2016-04-14 – 2016-04-15 (×3): 2 [IU] via SUBCUTANEOUS

## 2016-04-12 MED ORDER — METHOCARBAMOL 500 MG PO TABS
500.0000 mg | ORAL_TABLET | Freq: Four times a day (QID) | ORAL | Status: DC | PRN
  Administered 2016-04-13 (×3): 500 mg via ORAL
  Filled 2016-04-12 (×3): qty 1

## 2016-04-12 MED ORDER — MORPHINE SULFATE (PF) 2 MG/ML IV SOLN: 2.0000 mg | INTRAVENOUS | Status: DC | PRN

## 2016-04-12 MED ORDER — LACTATED RINGERS IV SOLN
INTRAVENOUS | Status: DC
  Administered 2016-04-12 (×2): via INTRAVENOUS

## 2016-04-12 MED ORDER — CHLORHEXIDINE GLUCONATE 4 % EX LIQD: 60.0000 mL | Freq: Once | CUTANEOUS | Status: DC

## 2016-04-12 MED ORDER — DOCUSATE SODIUM 100 MG PO CAPS
100.0000 mg | ORAL_CAPSULE | Freq: Two times a day (BID) | ORAL | Status: DC
  Administered 2016-04-12 – 2016-04-15 (×7): 100 mg via ORAL
  Filled 2016-04-12 (×7): qty 1

## 2016-04-12 MED ORDER — ADULT MULTIVITAMIN W/MINERALS CH
1.0000 | ORAL_TABLET | Freq: Every day | ORAL | Status: DC
  Administered 2016-04-13 – 2016-04-15 (×3): 1 via ORAL
  Filled 2016-04-12 (×3): qty 1

## 2016-04-12 MED ORDER — PROPOFOL 10 MG/ML IV BOLUS
INTRAVENOUS | Status: DC | PRN
  Administered 2016-04-12: 140 mg via INTRAVENOUS

## 2016-04-12 MED ORDER — DEXTROSE-NACL 5-0.9 % IV SOLN
INTRAVENOUS | Status: DC
  Administered 2016-04-12 – 2016-04-13 (×2): via INTRAVENOUS

## 2016-04-12 MED ORDER — ASPIRIN EC 81 MG PO TBEC: 81.0000 mg | DELAYED_RELEASE_TABLET | Freq: Two times a day (BID) | ORAL | 0 refills | Status: DC

## 2016-04-12 MED ORDER — CEFAZOLIN SODIUM-DEXTROSE 2-4 GM/100ML-% IV SOLN
INTRAVENOUS | Status: AC
  Filled 2016-04-12: qty 100

## 2016-04-12 MED ORDER — CEFAZOLIN SODIUM-DEXTROSE 2-4 GM/100ML-% IV SOLN
2.0000 g | Freq: Four times a day (QID) | INTRAVENOUS | Status: DC
  Filled 2016-04-12 (×2): qty 100

## 2016-04-12 MED ORDER — HYDROCODONE-ACETAMINOPHEN 7.5-325 MG PO TABS: 1.0000 | ORAL_TABLET | Freq: Four times a day (QID) | ORAL | 0 refills | Status: DC | PRN

## 2016-04-12 MED ORDER — ONDANSETRON HCL 4 MG/2ML IJ SOLN
INTRAMUSCULAR | Status: DC | PRN
Start: 2016-04-12 — End: 2016-04-12
  Administered 2016-04-12: 4 mg via INTRAVENOUS

## 2016-04-12 MED ORDER — POVIDONE-IODINE 10 % EX SWAB: 2.0000 "application " | Freq: Once | CUTANEOUS | Status: DC

## 2016-04-12 MED ORDER — HYDRALAZINE HCL 20 MG/ML IJ SOLN
10.0000 mg | Freq: Four times a day (QID) | INTRAMUSCULAR | Status: DC | PRN
  Administered 2016-04-12: 10 mg via INTRAVENOUS
  Filled 2016-04-12: qty 1

## 2016-04-12 MED ORDER — HYDROMORPHONE HCL 1 MG/ML IJ SOLN: 0.2500 mg | INTRAMUSCULAR | Status: DC | PRN

## 2016-04-12 MED ORDER — METOCLOPRAMIDE HCL 5 MG PO TABS: 5.0000 mg | ORAL_TABLET | Freq: Three times a day (TID) | ORAL | Status: DC | PRN

## 2016-04-12 MED ORDER — PROMETHAZINE HCL 25 MG/ML IJ SOLN: 6.2500 mg | INTRAMUSCULAR | Status: DC | PRN

## 2016-04-12 MED ORDER — DEXAMETHASONE SODIUM PHOSPHATE 10 MG/ML IJ SOLN
INTRAMUSCULAR | Status: AC
  Filled 2016-04-12: qty 3

## 2016-04-12 MED ORDER — HYDROMORPHONE HCL 2 MG/ML IJ SOLN
1.0000 mg | INTRAMUSCULAR | Status: DC | PRN
  Administered 2016-04-12: 1 mg via INTRAVENOUS
  Filled 2016-04-12: qty 1

## 2016-04-12 MED ORDER — PROPOFOL 10 MG/ML IV BOLUS
INTRAVENOUS | Status: AC
  Filled 2016-04-12: qty 20

## 2016-04-12 MED ORDER — CEFAZOLIN SODIUM-DEXTROSE 2-4 GM/100ML-% IV SOLN
2.0000 g | INTRAVENOUS | Status: AC
  Administered 2016-04-12: 2 g via INTRAVENOUS
  Filled 2016-04-12: qty 100

## 2016-04-12 MED ORDER — PHENYLEPHRINE HCL 10 MG/ML IJ SOLN
INTRAMUSCULAR | Status: DC | PRN
  Administered 2016-04-12: 40 ug via INTRAVENOUS

## 2016-04-12 MED ORDER — HEPARIN SODIUM (PORCINE) 5000 UNIT/ML IJ SOLN
5000.0000 [IU] | Freq: Three times a day (TID) | INTRAMUSCULAR | Status: DC
  Administered 2016-04-12 – 2016-04-15 (×10): 5000 [IU] via SUBCUTANEOUS
  Filled 2016-04-12 (×12): qty 1

## 2016-04-12 MED ORDER — ACETAMINOPHEN 650 MG RE SUPP: 650.0000 mg | Freq: Four times a day (QID) | RECTAL | Status: DC | PRN

## 2016-04-12 MED ORDER — ALBUTEROL SULFATE HFA 108 (90 BASE) MCG/ACT IN AERS
INHALATION_SPRAY | RESPIRATORY_TRACT | Status: AC
  Filled 2016-04-12: qty 6.7

## 2016-04-12 MED ORDER — METOCLOPRAMIDE HCL 5 MG/ML IJ SOLN: 5.0000 mg | Freq: Three times a day (TID) | INTRAMUSCULAR | Status: DC | PRN

## 2016-04-12 MED ORDER — SODIUM CHLORIDE 0.9% FLUSH
3.0000 mL | Freq: Two times a day (BID) | INTRAVENOUS | Status: DC
  Administered 2016-04-12 – 2016-04-14 (×7): 3 mL via INTRAVENOUS

## 2016-04-12 MED ORDER — ARTIFICIAL TEARS OP OINT
TOPICAL_OINTMENT | OPHTHALMIC | Status: AC
  Filled 2016-04-12: qty 3.5

## 2016-04-12 MED ORDER — CEFAZOLIN SODIUM-DEXTROSE 2-4 GM/100ML-% IV SOLN
2.0000 g | Freq: Four times a day (QID) | INTRAVENOUS | Status: AC
  Administered 2016-04-12 – 2016-04-13 (×3): 2 g via INTRAVENOUS
  Filled 2016-04-12 (×3): qty 100

## 2016-04-12 MED ORDER — FENTANYL CITRATE (PF) 100 MCG/2ML IJ SOLN
INTRAMUSCULAR | Status: DC | PRN
  Administered 2016-04-12 (×2): 50 ug via INTRAVENOUS

## 2016-04-12 MED ORDER — METHOCARBAMOL 1000 MG/10ML IJ SOLN
500.0000 mg | Freq: Four times a day (QID) | INTRAVENOUS | Status: DC | PRN
  Filled 2016-04-12: qty 5

## 2016-04-12 MED ORDER — CEFAZOLIN SODIUM 1 G IJ SOLR
INTRAMUSCULAR | Status: AC
  Filled 2016-04-12: qty 20

## 2016-04-12 MED ORDER — ACETAMINOPHEN 325 MG PO TABS
650.0000 mg | ORAL_TABLET | Freq: Four times a day (QID) | ORAL | Status: DC | PRN
  Administered 2016-04-13 – 2016-04-14 (×2): 650 mg via ORAL
  Filled 2016-04-12 (×2): qty 2

## 2016-04-12 MED ORDER — 0.9 % SODIUM CHLORIDE (POUR BTL) OPTIME
TOPICAL | Status: DC | PRN
  Administered 2016-04-12: 1000 mL

## 2016-04-12 MED ORDER — ONDANSETRON 4 MG PO TBDP: 4.0000 mg | ORAL_TABLET | Freq: Three times a day (TID) | ORAL | 0 refills | Status: DC | PRN

## 2016-04-12 MED ORDER — LIDOCAINE HCL (CARDIAC) 20 MG/ML IV SOLN
INTRAVENOUS | Status: DC | PRN
  Administered 2016-04-12: 80 mg via INTRAVENOUS

## 2016-04-12 MED ORDER — OXYCODONE HCL 5 MG PO TABS
5.0000 mg | ORAL_TABLET | ORAL | Status: DC | PRN
  Administered 2016-04-13: 10 mg via ORAL
  Administered 2016-04-13: 5 mg via ORAL
  Administered 2016-04-13 (×2): 10 mg via ORAL
  Filled 2016-04-12: qty 1
  Filled 2016-04-12 (×3): qty 2

## 2016-04-12 MED ORDER — SUGAMMADEX SODIUM 200 MG/2ML IV SOLN
INTRAVENOUS | Status: DC | PRN
  Administered 2016-04-12: 150 mg via INTRAVENOUS

## 2016-04-12 MED ORDER — ONDANSETRON HCL 4 MG/2ML IJ SOLN
4.0000 mg | Freq: Four times a day (QID) | INTRAMUSCULAR | Status: DC | PRN
  Administered 2016-04-12: 4 mg via INTRAVENOUS
  Filled 2016-04-12: qty 2

## 2016-04-12 SURGICAL SUPPLY — 42 items
ALCOHOL 70% 16 OZ (MISCELLANEOUS) IMPLANT
BIT DRILL FLUTED FEMUR 4.2/3 (BIT) ×3 IMPLANT
BNDG COHESIVE 4X5 TAN STRL (GAUZE/BANDAGES/DRESSINGS) IMPLANT
BNDG COHESIVE 6X5 TAN STRL LF (GAUZE/BANDAGES/DRESSINGS) IMPLANT
COVER PERINEAL POST (MISCELLANEOUS) ×3 IMPLANT
COVER SURGICAL LIGHT HANDLE (MISCELLANEOUS) ×3 IMPLANT
DRAPE C-ARM 42X72 X-RAY (DRAPES) ×3 IMPLANT
DRAPE C-ARMOR (DRAPES) IMPLANT
DRAPE INCISE IOBAN 66X45 STRL (DRAPES) ×3 IMPLANT
DRAPE ORTHO SPLIT 77X108 STRL (DRAPES)
DRAPE PROXIMA HALF (DRAPES) IMPLANT
DRAPE STERI IOBAN 125X83 (DRAPES) IMPLANT
DRAPE SURG ORHT 6 SPLT 77X108 (DRAPES) IMPLANT
DRSG TEGADERM 2-3/8X2-3/4 SM (GAUZE/BANDAGES/DRESSINGS) IMPLANT
DRSG TEGADERM 4X4.75 (GAUZE/BANDAGES/DRESSINGS) IMPLANT
DURAPREP 26ML APPLICATOR (WOUND CARE) ×3 IMPLANT
ELECT REM PT RETURN 9FT ADLT (ELECTROSURGICAL) ×3
ELECTRODE REM PT RTRN 9FT ADLT (ELECTROSURGICAL) ×1 IMPLANT
FACESHIELD WRAPAROUND (MASK) IMPLANT
GAUZE SPONGE 4X4 12PLY STRL (GAUZE/BANDAGES/DRESSINGS) ×3 IMPLANT
GAUZE XEROFORM 1X8 LF (GAUZE/BANDAGES/DRESSINGS) ×3 IMPLANT
GLOVE BIO SURGEON STRL SZ7.5 (GLOVE) ×3 IMPLANT
GLOVE BIOGEL PI IND STRL 8 (GLOVE) ×1 IMPLANT
GLOVE BIOGEL PI INDICATOR 8 (GLOVE) ×2
GOWN STRL REUS W/ TWL LRG LVL3 (GOWN DISPOSABLE) ×2 IMPLANT
GOWN STRL REUS W/ TWL XL LVL3 (GOWN DISPOSABLE) IMPLANT
GOWN STRL REUS W/TWL LRG LVL3 (GOWN DISPOSABLE) ×4
GOWN STRL REUS W/TWL XL LVL3 (GOWN DISPOSABLE)
GUIDEWIRE 3.2X400 (WIRE) ×3 IMPLANT
KIT BASIN OR (CUSTOM PROCEDURE TRAY) ×3 IMPLANT
KIT ROOM TURNOVER OR (KITS) ×3 IMPLANT
MANIFOLD NEPTUNE II (INSTRUMENTS) IMPLANT
NAIL TROCH FIX 10X235 RT 130 (Nail) ×3 IMPLANT
NS IRRIG 1000ML POUR BTL (IV SOLUTION) ×3 IMPLANT
PACK GENERAL/GYN (CUSTOM PROCEDURE TRAY) ×3 IMPLANT
PAD ARMBOARD 7.5X6 YLW CONV (MISCELLANEOUS) ×6 IMPLANT
SCREW LOCKING 5.0X38MM (Screw) ×3 IMPLANT
SCREW TFNA 105MM STERILE (Screw) ×3 IMPLANT
SPONGE GAUZE 4X4 12PLY STER LF (GAUZE/BANDAGES/DRESSINGS) ×3 IMPLANT
STAPLER VISISTAT 35W (STAPLE) IMPLANT
SUT VIC AB 2-0 CT1 27 (SUTURE) ×2
SUT VIC AB 2-0 CT1 TAPERPNT 27 (SUTURE) ×1 IMPLANT

## 2016-04-12 NOTE — Progress Notes (Signed)
  Echocardiogram 2D Echocardiogram has been performed.  April Page 04/12/2016, 10:22 AM

## 2016-04-12 NOTE — Transfer of Care (Signed)
Immediate Anesthesia Transfer of Care Note  Patient: April Page  Procedure(s) Performed: Procedure(s): INTRAMEDULLARY (IM) NAIL FEMORAL (Right)  Patient Location: PACU  Anesthesia Type:General  Level of Consciousness: awake and patient cooperative  Airway & Oxygen Therapy: Patient Spontanous Breathing and Patient connected to face mask oxygen  Post-op Assessment: Report given to RN and Post -op Vital signs reviewed and stable  Post vital signs: Reviewed and stable  Last Vitals:  Vitals:   04/12/16 1608 04/12/16 1732  BP: (!) 206/84 136/67  Pulse: 88 73  Resp:    Temp: 37.1 C 36.6 C    Last Pain:  Vitals:   04/12/16 1732  TempSrc: Oral  PainSc:          Complications: No apparent anesthesia complications

## 2016-04-12 NOTE — Anesthesia Procedure Notes (Signed)
Procedure Name: Intubation Date/Time: 04/12/2016 6:32 PM Performed by: Salli Quarry Terra Aveni Pre-anesthesia Checklist: Patient identified, Emergency Drugs available, Suction available and Patient being monitored Patient Re-evaluated:Patient Re-evaluated prior to inductionOxygen Delivery Method: Circle System Utilized Preoxygenation: Pre-oxygenation with 100% oxygen Intubation Type: IV induction Ventilation: Mask ventilation without difficulty Laryngoscope Size: Mac and 3 Grade View: Grade I Tube type: Oral Tube size: 7.0 mm Number of attempts: 1 Airway Equipment and Method: Stylet Placement Confirmation: ETT inserted through vocal cords under direct vision,  positive ETCO2 and breath sounds checked- equal and bilateral Secured at: 22 cm Tube secured with: Tape Dental Injury: Teeth and Oropharynx as per pre-operative assessment

## 2016-04-12 NOTE — Anesthesia Postprocedure Evaluation (Addendum)
Anesthesia Post Note  Patient: April Page  Procedure(s) Performed: Procedure(s) (LRB): INTRAMEDULLARY (IM) NAIL FEMORAL (Right)  Patient location during evaluation: PACU Anesthesia Type: General Level of consciousness: sedated Pain management: pain level controlled Vital Signs Assessment: post-procedure vital signs reviewed and stable Respiratory status: spontaneous breathing and respiratory function stable Cardiovascular status: stable Anesthetic complications: no       Last Vitals:  Vitals:   04/12/16 2015 04/12/16 2030  BP: (!) 160/70 (!) 150/90  Pulse:    Resp:    Temp:  36.3 C    Last Pain:  Vitals:   04/12/16 2030  TempSrc:   PainSc: 0-No pain                 Lilly Gasser DANIEL

## 2016-04-12 NOTE — H&P (Signed)
History and Physical    April Page EPP:[id number] DOB: [birth year] DOA: 04/11/2016  PCP: [name], MD  Patient coming from: Home.    Chief Complaint:  Lightheadedness, fell, and right hip pain.   HPI: April Page is an 71 y.o. female with hx of CML on oral chemotherapy, Hx of obesity, HTN, HLD, lives alone, had 4 daughters, felt lightheaded today, and fell, resulting in pain in her right hip.  She had no palpitation, CP, black or bloody stool.  She did not start any new medication, and generally had no lightheadedness.  She denied vertigo.   Work up in the ER included normal EKG with NSR at 70, normal K and Cr, with CBG of 213.  Her CT of the hip showed comminuted fracture of the right hip.  EDP consulted Dr Stann Mainland, and he planned to do surgery later today or tomorrow.  Hospitalist was asked to admit her via hip pathway. She did not lose consciousness.   ED Course:  See above.  Rewiew of Systems:  Constitutional: Negative for malaise, fever and chills. No significant weight loss or weight gain Eyes: Negative for eye pain, redness and discharge, diplopia, visual changes, or flashes of light. ENMT: Negative for ear pain, hoarseness, nasal congestion, sinus pressure and sore throat. No headaches; tinnitus, drooling, or problem swallowing. Cardiovascular: Negative for chest pain, palpitations, diaphoresis, dyspnea and peripheral edema. ; No orthopnea, PND Respiratory: Negative for cough, hemoptysis, wheezing and stridor. No pleuritic chestpain. Gastrointestinal: Negative for diarrhea, constipation,  melena, blood in stool, hematemesis, jaundice and rectal bleeding.    Genitourinary: Negative for frequency, dysuria, incontinence,flank pain and hematuria; Musculoskeletal: Negative for back pain and neck pain. Negative for swelling and trauma.;  Skin: . Negative for pruritus, rash, abrasions, bruising and skin lesion.; ulcerations Neuro: Negative for headache, lightheadedness and  neck stiffness. Negative for weakness, altered level of consciousness , altered mental status, extremity weakness, burning feet, involuntary movement, seizure and syncope.  Psych: negative for anxiety, depression, insomnia, tearfulness, panic attacks, hallucinations, paranoia, suicidal or homicidal ideation    Past Medical History:  Diagnosis Date  . Arthritis    stiff knees  . Blood dyscrasia   . Cancer ([place])   . GERD (gastroesophageal reflux disease)   . H/O: CML (chronic myeloid leukemia)   . Hyperlipidemia   . Hypertension   . Obesity   . Pessary maintenance 07/01/2013    Past Surgical History:  Procedure Laterality Date  . ABDOMINAL HYSTERECTOMY    . CHOLECYSTECTOMY    . COLONOSCOPY  06/09/2002   SAY:[id number] hemorrhoids; otherwise normal rectum, colon   . COLONOSCOPY N/A 08/25/2013   NAT:FTDDUKG diverticulosis. Single colonic polyp-removed  s/p segmental biopsy and stool sample. random colon bx negative. +benign leiomyoma.  . cystocele/rectocele repair  2009  . ESOPHAGOGASTRODUODENOSCOPY  06/09/2002   URK:YHCWCB upper gastrointestinal tract s/p  54-French Maloney dilator  . HERNIA REPAIR    . OOPHORECTOMY       reports that she has never smoked. She has never used smokeless tobacco. She reports that she does not drink alcohol or use drugs.  No Known Allergies  Family History  Problem Relation Age of Onset  . Anuerysm Mother     deceased age 36, brain anuerysm  . Early death Mother 18  . Heart disease Father   . Hypertension Sister   . Obesity Brother   . Hypertension Sister   . Arthritis Sister   . Other Son     cardiac arrest  .  Heart disease Child 19    cardiac arrest  . Colon cancer Neg Hx      Prior to Admission medications   Medication Sig Start Date End Date Taking? Authorizing Provider  lisinopril (PRINIVIL,ZESTRIL) 10 MG tablet Take 1 tablet (10 mg total) by mouth daily. 01/17/15  Yes Baird Cancer, PA-C  Multiple Vitamin (MULTIVITAMIN WITH  MINERALS) TABS tablet Take 1 tablet by mouth daily.   Yes Historical Provider, MD  nilotinib (TASIGNA) 150 MG capsule Take 2 capsules (300 mg total) by mouth every 12 (twelve) hours. 09/05/15  Yes Baird Cancer, PA-C    Physical Exam: Vitals:   04/11/16 2215 04/11/16 2330 04/12/16 0000 04/12/16 0030  BP:  158/74 159/76 169/80  Pulse: 73 64 66 68  Resp: 15 22 19 21   Temp:      TempSrc:      SpO2: 99% 95% 97% 97%  Weight:      Height:       Constitutional: NAD, calm, comfortable Vitals:   04/11/16 2215 04/11/16 2330 04/12/16 0000 04/12/16 0030  BP:  158/74 159/76 169/80  Pulse: 73 64 66 68  Resp: 15 22 19 21   Temp:      TempSrc:      SpO2: 99% 95% 97% 97%  Weight:      Height:       Eyes: PERRL, lids and conjunctivae normal ENMT: Mucous membranes are moist. Posterior pharynx clear of any exudate or lesions.Normal dentition.  Neck: normal, supple, no masses, no thyromegaly Respiratory: clear to auscultation bilaterally, no wheezing, no crackles. Normal respiratory effort. No accessory muscle use.  Cardiovascular: Regular rate and rhythm, no murmurs / rubs / gallops. No extremity edema. 2+ pedal pulses. No carotid bruits.  Abdomen: no tenderness, no masses palpated. No hepatosplenomegaly. Bowel sounds positive.  Musculoskeletal: no clubbing / cyanosis. No joint deformity upper and lower extremities. Good ROM, no contractures. Normal muscle tone.  Skin: no rashes, lesions, ulcers. No induration Neurologic: CN 2-12 grossly intact. Sensation intact, DTR normal. Strength 5/5 in all 4.  Psychiatric: Normal judgment and insight. Alert and oriented x 3. Normal mood.   Labs on Admission: I have personally reviewed following labs and imaging studies  CBC:  Recent Labs Lab 04/11/16 2132  WBC 9.9  NEUTROABS 8.0*  HGB 12.4  HCT 37.0  MCV 83.1  PLT 465   Basic Metabolic Panel:  Recent Labs Lab 04/11/16 2132  NA 140  K 3.4*  CL 105  CO2 24  GLUCOSE 213*  BUN 17    CREATININE 0.96  CALCIUM 9.8   GFR:  Recent Labs Lab 04/11/16 2132  CKTOTAL 197   Radiological Exams on Admission: Dg Chest 1 View  Result Date: 04/11/2016 CLINICAL DATA:  Status post fall, with concern for chest injury. Dizziness. Initial encounter. EXAM: CHEST 1 VIEW COMPARISON:  Chest radiograph performed 03/31/2015 FINDINGS: The lungs are well-aerated and clear. There is no evidence of focal opacification, pleural effusion or pneumothorax. The cardiomediastinal silhouette is mildly enlarged. No acute osseous abnormalities are seen. There is prominence of the superior mediastinum, with rightward deviation of the trachea, raising concern for a superior mediastinal mass. IMPRESSION: 1. No displaced rib fracture seen. 2. Mild cardiomegaly, with prominence of the superior mediastinum. Rightward deviation of the trachea. This raises concern for a superior mediastinal mass, better characterized than on prior studies. CT of the chest with contrast would be helpful for further evaluation, when and as deemed clinically appropriate. Electronically Signed  By: Garald Balding M.D.   On: 04/11/2016 22:04   Ct Hip Right Wo Contrast  Result Date: 04/12/2016 CLINICAL DATA:  Status post fall, with right hip pain. Initial encounter. EXAM: CT OF THE RIGHT HIP WITHOUT CONTRAST TECHNIQUE: Multidetector CT imaging of the right hip was performed according to the standard protocol. Multiplanar CT image reconstructions were also generated. COMPARISON:  Right hip radiographs performed earlier today at 9:29 p.m. FINDINGS: Bones/Joint/Cartilage There is a comminuted right femoral intertrochanteric fracture, with basicervical fracture line, and fracture line extending into the proximal femoral diaphysis. A mildly displaced lesser trochanteric fragment is seen. There is superior joint space narrowing at the right hip, with associated sclerosis and cortical irregularity. Mild subcortical cyst formation is seen. The cartilage  is not well assessed. Ligaments Suboptimally assessed by CT. Muscles and Tendons Mild soft tissue injury is seen within the musculature about the proximal right femur. No definite focal tendon injuries are seen. Soft tissues Mild soft tissue injury is seen along the posterior aspect of the proximal right thigh. Scattered small and large bowel loops are grossly unremarkable. The bladder is moderately distended and grossly unremarkable. A pelvic ring is noted. IMPRESSION: 1. Comminuted right femoral intertrochanteric fracture, with basicervical fracture line, and fracture line extending into the proximal femoral diaphysis. Mildly displaced lesser trochanteric fragment noted. 2. Superior joint space narrowing at the right hip, with associated sclerosis and cortical irregularity. Mild subcortical cyst formation noted. 3. Mild soft tissue injury within the musculature about the proximal right femur. Mild soft tissue injury along the posterior aspect of the proximal right thigh. Electronically Signed   By: Garald Balding M.D.   On: 04/12/2016 00:12   Dg Hip Unilat With Pelvis 2-3 Views Right  Result Date: 04/11/2016 CLINICAL DATA:  Status post fall, with right hip pain. Initial encounter. EXAM: DG HIP (WITH OR WITHOUT PELVIS) 2-3V RIGHT COMPARISON:  None. FINDINGS: There is no evidence of fracture or dislocation. Both femoral heads are seated normally within their respective acetabula. The proximal right femur appears intact. Mild superior joint space narrowing is noted at the right hip, with mild associated sclerosis. Mild degenerative change is noted at the lower lumbar spine. The visualized bowel gas pattern is grossly unremarkable in appearance. Scattered phleboliths are noted within the pelvis. IMPRESSION: No evidence of fracture or dislocation. Mild superior joint space narrowing at the right hip. Electronically Signed   By: Garald Balding M.D.   On: 04/11/2016 22:02    EKG: Independently reviewed.    Assessment/Plan Active Problems:   Diabetes type 2, uncontrolled (HCC)   Chronic myelogenous leukemia (CML), BCR-ABL1-positive (HCC)   S/P right hip fracture   Hip fracture (HCC)    PLAN:   Lightheadedness:  Will place on monitor, and obtain ECHO. She did not have SOB, CP or palpitation.  She is not clinically volume depleted.  Would not hold up surgery, though this was not a mechanical fall.   Will give IVF and observe.  Cycle troponins.   Right hip Fx:  Made NPO.  Should proceed with ORIF as soon as possible.  Give IV Dilaudid and place foley.   Dr Stann Mainland is aware of her admission.   Transfer to [employer].   DM:  Use moderate SSI.  IVF with D5NS.   CML: Stable.  On Chemotherapy.     DVT prophylaxis: SUB Q Heparin.  Code Status: FULL CODE.  Family Communication: 2/4 daughters at bedside.  Disposition Plan: TBD.  Consults called:  Ortho.  Admission status: FULL Haskel Khan MD FACP. [facility]  If 7PM-7AM, please contact night-coverage [email or web address] Password [facility]  04/12/2016, 12:58 AM

## 2016-04-12 NOTE — Discharge Instructions (Signed)
-  ok for full weight bearing as tolerated -ambulate with assistive device as instructed by physical therapist -maintain dressing until follow up appointment unless it becomes saturated and at that point it may be replaced by clean and dry dressing -ok to shower with bandages in place -return to clinic in 2 weeks for wound check -take 81 mg asa twice daily for blood clot prevention

## 2016-04-12 NOTE — Anesthesia Preprocedure Evaluation (Signed)
Anesthesia Evaluation  Patient identified by MRN, date of birth, ID band Patient awake    Reviewed: Allergy & Precautions, NPO status , Patient's Chart, lab work & pertinent test results  History of Anesthesia Complications Negative for: history of anesthetic complications  Airway Mallampati: II  TM Distance: >3 FB Neck ROM: Full    Dental no notable dental hx. (+) Dental Advisory Given   Pulmonary neg pulmonary ROS,    Pulmonary exam normal        Cardiovascular hypertension, Pt. on medications negative cardio ROS Normal cardiovascular exam     Neuro/Psych negative neurological ROS  negative psych ROS   GI/Hepatic Neg liver ROS, GERD  ,  Endo/Other  negative endocrine ROSdiabetes  Renal/GU negative Renal ROS  negative genitourinary   Musculoskeletal negative musculoskeletal ROS (+)   Abdominal   Peds negative pediatric ROS (+)  Hematology  (+) Blood dyscrasia, , CML   Anesthesia Other Findings   Reproductive/Obstetrics negative OB ROS                             Anesthesia Physical Anesthesia Plan  ASA: III  Anesthesia Plan: General   Post-op Pain Management:    Induction: Intravenous  Airway Management Planned: Oral ETT  Additional Equipment:   Intra-op Plan:   Post-operative Plan: Extubation in OR  Informed Consent: I have reviewed the patients History and Physical, chart, labs and discussed the procedure including the risks, benefits and alternatives for the proposed anesthesia with the patient or authorized representative who has indicated his/her understanding and acceptance.   Dental advisory given and Consent reviewed with POA  Plan Discussed with: CRNA, Anesthesiologist and Surgeon  Anesthesia Plan Comments:         Anesthesia Quick Evaluation

## 2016-04-12 NOTE — Op Note (Signed)
    Date of Surgery: 04/12/2016  INDICATIONS: April Page is a 71 y.o.-year-old female who sustained a right hip fracture. The risks and benefits of the procedure discussed with the patient and family prior to the procedure and all questions were answered; consent was obtained.  PREOPERATIVE DIAGNOSIS: right hip fracture   POSTOPERATIVE DIAGNOSIS: Same   PROCEDURE: Treatment of intertrochanteric, pertrochanteric, subtrochanteric fracture with intramedullary implant. CPT [phone]   SURGEON: Dannielle Karvonen. Stann Mainland, M.D.   ANESTHESIA: general   IV FLUIDS AND URINE: See anesthesia record   ESTIMATED BLOOD LOSS: 100 cc  IMPLANTS:  Synthes TFN A 10 mm intermediate length 133mm compression screw  DRAINS: None.   COMPLICATIONS: None.   DESCRIPTION OF PROCEDURE: The patient was brought to the operating room and placed supine on the operating table. The patient's leg had been signed prior to the procedure. The patient had the anesthesia placed by the anesthesiologist. The prep verification and incision time-outs were performed to confirm that this was the correct patient, site, side and location. The patient had an SCD on the opposite lower extremity. The patient did receive antibiotics prior to the incision and was re-dosed during the procedure as needed at indicated intervals. The patient was positioned on the fracture table with the table in traction and internal rotation to reduce the hip. The well leg was placed in a scissor position and all bony prominences were well-padded. The patient had the lower extremity prepped and draped in the standard surgical fashion. The incision was made 4 finger breadths superior to the greater trochanter. A guide pin was inserted into the tip of the greater trochanter under fluoroscopic guidance. An opening reamer was used to gain access to the femoral canal. The nail length was measured and inserted down the femoral canal to its proper depth. The appropriate version of  insertion for the lag screw was found under fluoroscopy. A pin was inserted up the femoral neck through the jig. The length of the lag screw was then measured. The lag screw was inserted as near to center-center in the head as possible. A distal interlock screw was placed through the jig.  This was first drilled and then measured and placed under hand power across the nail and had good purchase.  The wound was copiously irrigated with saline and the subcutaneous layer closed with 2.0 vicryl and the skin was reapproximated with staples. The wounds were cleaned and dried a final time and a sterile dressing was placed. The hip was taken through a range of motion at the end of the case under fluoroscopic imaging to visualize the approach-withdraw phenomenon and confirm implant length in the head. The patient was then awakened from anesthesia and taken to the recovery room in stable condition. All counts were correct at the end of the case.   POSTOPERATIVE PLAN: April Page will be weight bearing as tolerated and will return in 2 weeks for staple removal and the patient will receive DVT prophylaxis based on other medications, activity level, and risk ratio of bleeding to thrombosis.   Geralynn Rile, [place] [phone] 7:57 PM

## 2016-04-12 NOTE — Consult Note (Signed)
ORTHOPAEDIC CONSULTATION  REQUESTING PHYSICIAN: Kerney Elbe, DO  PCP:  [name], MD  Chief Complaint: Right hip pain  HPI: April Page is a 71 y.o. female who complains of  Right hip pain.  She has a PMHx of leukemia and HTN and presented to [facility] ED last night with right hip pain following an unwitnessed fall yesterday am.  She was found by her family over 14 hours later and brought to Ed.  She was found to have negative xrays but CT scan confirmed right hip IT hip fracture.  The pt has not been able to bear weight since the fall.  She lives independently and walks with out assistive device at baseline.  Past Medical History:  Diagnosis Date  . Arthritis    stiff knees  . Blood dyscrasia   . Cancer ([place])   . GERD (gastroesophageal reflux disease)   . H/O: CML (chronic myeloid leukemia)   . Hyperlipidemia   . Hypertension   . Obesity   . Pessary maintenance 07/01/2013   Past Surgical History:  Procedure Laterality Date  . ABDOMINAL HYSTERECTOMY    . CHOLECYSTECTOMY    . COLONOSCOPY  06/09/2002   OZY:YQMGNOIB hemorrhoids; otherwise normal rectum, colon   . COLONOSCOPY N/A 08/25/2013   BCW:UGQBVQX diverticulosis. Single colonic polyp-removed  s/p segmental biopsy and stool sample. random colon bx negative. +benign leiomyoma.  . cystocele/rectocele repair  2009  . ESOPHAGOGASTRODUODENOSCOPY  06/09/2002   IHW:TUUEKC upper gastrointestinal tract s/p  54-French Maloney dilator  . HERNIA REPAIR    . OOPHORECTOMY     Social History   Social History  . Marital status: Widowed    Spouse name: N/A  . Number of children: 6  . Years of education: associate   Occupational History  .  Retired   Social History Main Topics  . Smoking status: Never Smoker  . Smokeless tobacco: Never Used  . Alcohol use No  . Drug use: No  . Sexual activity: Not Currently    Birth control/ protection: Surgical     Comment: hyst   Other Topics Concern  . None    Social History Narrative  . None   Family History  Problem Relation Age of Onset  . Anuerysm Mother     deceased age 15, brain anuerysm  . Early death Mother 74  . Heart disease Father   . Hypertension Sister   . Obesity Brother   . Hypertension Sister   . Arthritis Sister   . Other Son     cardiac arrest  . Heart disease Child 33    cardiac arrest  . Colon cancer Neg Hx    No Known Allergies Prior to Admission medications   Medication Sig Start Date End Date Taking? Authorizing Provider  lisinopril (PRINIVIL,ZESTRIL) 10 MG tablet Take 1 tablet (10 mg total) by mouth daily. 01/17/15  Yes Baird Cancer, PA-C  Multiple Vitamin (MULTIVITAMIN WITH MINERALS) TABS tablet Take 1 tablet by mouth daily.   Yes Historical Provider, MD  nilotinib (TASIGNA) 150 MG capsule Take 2 capsules (300 mg total) by mouth every 12 (twelve) hours. 09/05/15  Yes Baird Cancer, PA-C   Dg Chest 1 View  Result Date: 04/11/2016 CLINICAL DATA:  Status post fall, with concern for chest injury. Dizziness. Initial encounter. EXAM: CHEST 1 VIEW COMPARISON:  Chest radiograph performed 03/31/2015 FINDINGS: The lungs are well-aerated and clear. There is no evidence of focal opacification, pleural effusion or pneumothorax. The cardiomediastinal silhouette  is mildly enlarged. No acute osseous abnormalities are seen. There is prominence of the superior mediastinum, with rightward deviation of the trachea, raising concern for a superior mediastinal mass. IMPRESSION: 1. No displaced rib fracture seen. 2. Mild cardiomegaly, with prominence of the superior mediastinum. Rightward deviation of the trachea. This raises concern for a superior mediastinal mass, better characterized than on prior studies. CT of the chest with contrast would be helpful for further evaluation, when and as deemed clinically appropriate. Electronically Signed   By: Garald Balding M.D.   On: 04/11/2016 22:04   Ct Hip Right Wo Contrast  Result Date:  04/12/2016 CLINICAL DATA:  Status post fall, with right hip pain. Initial encounter. EXAM: CT OF THE RIGHT HIP WITHOUT CONTRAST TECHNIQUE: Multidetector CT imaging of the right hip was performed according to the standard protocol. Multiplanar CT image reconstructions were also generated. COMPARISON:  Right hip radiographs performed earlier today at 9:29 p.m. FINDINGS: Bones/Joint/Cartilage There is a comminuted right femoral intertrochanteric fracture, with basicervical fracture line, and fracture line extending into the proximal femoral diaphysis. A mildly displaced lesser trochanteric fragment is seen. There is superior joint space narrowing at the right hip, with associated sclerosis and cortical irregularity. Mild subcortical cyst formation is seen. The cartilage is not well assessed. Ligaments Suboptimally assessed by CT. Muscles and Tendons Mild soft tissue injury is seen within the musculature about the proximal right femur. No definite focal tendon injuries are seen. Soft tissues Mild soft tissue injury is seen along the posterior aspect of the proximal right thigh. Scattered small and large bowel loops are grossly unremarkable. The bladder is moderately distended and grossly unremarkable. A pelvic ring is noted. IMPRESSION: 1. Comminuted right femoral intertrochanteric fracture, with basicervical fracture line, and fracture line extending into the proximal femoral diaphysis. Mildly displaced lesser trochanteric fragment noted. 2. Superior joint space narrowing at the right hip, with associated sclerosis and cortical irregularity. Mild subcortical cyst formation noted. 3. Mild soft tissue injury within the musculature about the proximal right femur. Mild soft tissue injury along the posterior aspect of the proximal right thigh. Electronically Signed   By: Garald Balding M.D.   On: 04/12/2016 00:12   Dg Hip Unilat With Pelvis 2-3 Views Right  Result Date: 04/11/2016 CLINICAL DATA:  Status post fall, with  right hip pain. Initial encounter. EXAM: DG HIP (WITH OR WITHOUT PELVIS) 2-3V RIGHT COMPARISON:  None. FINDINGS: There is no evidence of fracture or dislocation. Both femoral heads are seated normally within their respective acetabula. The proximal right femur appears intact. Mild superior joint space narrowing is noted at the right hip, with mild associated sclerosis. Mild degenerative change is noted at the lower lumbar spine. The visualized bowel gas pattern is grossly unremarkable in appearance. Scattered phleboliths are noted within the pelvis. IMPRESSION: No evidence of fracture or dislocation. Mild superior joint space narrowing at the right hip. Electronically Signed   By: Garald Balding M.D.   On: 04/11/2016 22:02    Positive ROS: All other systems have been reviewed and were otherwise negative with the exception of those mentioned in the HPI and as above.  Physical Exam: General: Alert, no acute distress Cardiovascular: No pedal edema Respiratory: No cyanosis, no use of accessory musculature GI: No organomegaly, abdomen is soft and non-tender Skin: No lesions in the area of chief complaint Neurologic: Sensation intact distally Psychiatric: Patient is competent for consent with normal mood and affect Lymphatic: No axillary or cervical lymphadenopathy  MUSCULOSKELETAL:  RLE- held  in ER but equal length with left.  Endorses SILT and motor distally.  Skin is WWP  Assessment: Right hip IT fracture  Plan: -to OR today for IMN of right hip -will return to medicine service post op for dc planning -The risks, benefits, and alternatives were discussed with the patient. There are risks associated with the surgery including, but not limited to, problems with anesthesia (death), infection, differences in leg length/angulation/rotation, fracture of bones, loosening or failure of implants, malunion, nonunion, hematoma (blood accumulation) which may require surgical drainage, blood clots, pulmonary  embolism, nerve injury (foot drop), and blood vessel injury. The patient understands these risks and elects to proceed.     Nicholes Stairs, MD [facility] [phone]    04/12/2016 6:21 PM

## 2016-04-12 NOTE — ED Provider Notes (Addendum)
12:25 AM Dr Stann Mainland, Orthopedics, wants patient to be NPO and admitted to [facility].  12:37 AM Dr Marin Comment, hospitalist, will arrange admission to hospitalist service at [facility].  12:40 AM patient and family made aware of transfer and are agreeable.   Rolland Porter, MD, Barbette Or, MD 04/12/16 7530    Rolland Porter, MD 04/12/16 [phone]

## 2016-04-12 NOTE — Progress Notes (Signed)
Plan for surgery this evening on her right hip.  Please keep NPO today.  I will be by after morning clinic to complete a full consultation.   Nicholes Stairs

## 2016-04-12 NOTE — Progress Notes (Signed)
Patient was admitted over night after midnight and H and P has been reviewed and I am in agreement with Dr. Gus Puma Assessment and Plan. Patient is a 71 year old African American obese female with a PMH of HTN, HLD, CML on Chemotherapy with Nilotinib (tasigna) 300 mg po BID, Arthritis, GERD, and other comorbidities who presented to [facility] because of feeling lightheaded after urinating resulting in subsequent fall and fracture of Right Hip. CT showed that she had a comminuted Right Femoral intertrochanteric fracture with basicervical fracture line and fracture extending into the proximal femoral diaphysis and a mildly displaced lesser trochanteric fragment noted. There was also superior joint space narrowing at the right hip, with associated sclerosis and cortical irregularity. Mild subcortical cyst formation noted. The CT scan also noted mild soft tissue injury within the musculature about the proximal right femur and mild soft tissue injury along the posterior aspect of the proximal right thigh. Dr. Stann Mainland of orthopedic surgery was contacted and he planned to do Surgery later on today. She was subsequently transferred to [facility] for the planned surgery. I called Hematology about her Marita Snellen and discussed with Dr. Alvy Bimler about holding it and she stated it would be ok to hold for up to a week. Patient's Lightheadness has resolved and ECHO has been done and pending. Initial Cardiac Troponin has been Negative with the rest pending. Patient is on IVF and doing well but hungry. Awaiting surgery later today by Dr. Victorino December. Will continue to follow.

## 2016-04-12 NOTE — Progress Notes (Signed)
Inpatient Diabetes Program Recommendations  AACE/ADA: New Consensus Statement on Inpatient Glycemic Control (2015)  Target Ranges:  Prepandial:   less than 140 mg/dL      Peak postprandial:   less than 180 mg/dL (1-2 hours)      Critically ill patients:  140 - 180 mg/dL   Lab Results  Component Value Date   GLUCAP 238 (H) 04/12/2016   HGBA1C 9.2 (H) 02/24/2015    Review of Glycemic Control  Diabetes history: Based on A1C on 02/24/2015, DM Outpatient Diabetes medications: None Current orders for Inpatient glycemic control: Moderate correction scale Novolog 0-15 units Q4H  Inpatient Diabetes Program Recommendations:    Please consider Lantus 14 units daily.  Thank you,  Windy Carina, RN, MSN Diabetes Coordinator Inpatient Diabetes Program [phone] (Team Pager)

## 2016-04-13 DIAGNOSIS — R739 Hyperglycemia, unspecified: Secondary | ICD-10-CM

## 2016-04-13 DIAGNOSIS — W19XXXA Unspecified fall, initial encounter: Secondary | ICD-10-CM

## 2016-04-13 DIAGNOSIS — E876 Hypokalemia: Secondary | ICD-10-CM

## 2016-04-13 DIAGNOSIS — E785 Hyperlipidemia, unspecified: Secondary | ICD-10-CM

## 2016-04-13 DIAGNOSIS — I10 Essential (primary) hypertension: Secondary | ICD-10-CM

## 2016-04-13 DIAGNOSIS — R42 Dizziness and giddiness: Secondary | ICD-10-CM

## 2016-04-13 LAB — MAGNESIUM: Magnesium: 1.7 mg/dL (ref 1.7–2.4)

## 2016-04-13 LAB — COMPREHENSIVE METABOLIC PANEL
ALBUMIN: 3 g/dL — AB (ref 3.5–5.0)
ALT: 19 U/L (ref 14–54)
ANION GAP: 8 (ref 5–15)
AST: 26 U/L (ref 15–41)
Alkaline Phosphatase: 76 U/L (ref 38–126)
BILIRUBIN TOTAL: 0.7 mg/dL (ref 0.3–1.2)
BUN: 11 mg/dL (ref 6–20)
CHLORIDE: 107 mmol/L (ref 101–111)
CO2: 23 mmol/L (ref 22–32)
Calcium: 8.7 mg/dL — ABNORMAL LOW (ref 8.9–10.3)
Creatinine, Ser: 1.01 mg/dL — ABNORMAL HIGH (ref 0.44–1.00)
GFR calc Af Amer: >60 mL/min (ref >60)
GFR calc non Af Amer: 55 mL/min — ABNORMAL LOW (ref >60)
GLUCOSE: 242 mg/dL — AB (ref 65–99)
POTASSIUM: 3.5 mmol/L (ref 3.5–5.1)
Sodium: 138 mmol/L (ref 135–145)
TOTAL PROTEIN: 5.9 g/dL — AB (ref 6.5–8.1)

## 2016-04-13 LAB — GLUCOSE, CAPILLARY
GLUCOSE-CAPILLARY: 171 mg/dL — AB (ref 65–99)
GLUCOSE-CAPILLARY: 131 mg/dL — AB (ref 65–99)
Glucose-Capillary: 201 mg/dL — ABNORMAL HIGH (ref 65–99)
Glucose-Capillary: 165 mg/dL — ABNORMAL HIGH (ref 65–99)
Glucose-Capillary: 105 mg/dL — ABNORMAL HIGH (ref 65–99)

## 2016-04-13 LAB — CBC WITH DIFFERENTIAL/PLATELET
BASOS PCT: 0 %
Basophils Absolute: 0 10*3/uL (ref 0.0–0.1)
EOS ABS: 0 10*3/uL (ref 0.0–0.7)
EOS PCT: 0 %
HCT: 29.2 % — ABNORMAL LOW (ref 36.0–46.0)
Hemoglobin: 9.4 g/dL — ABNORMAL LOW (ref 12.0–15.0)
Lymphocytes Relative: 14 %
Lymphs Abs: 1.2 10*3/uL (ref 0.7–4.0)
MCH: 27.2 pg (ref 26.0–34.0)
MCHC: 32.2 g/dL (ref 30.0–36.0)
MCV: 84.4 fL (ref 78.0–100.0)
MONO ABS: 0.9 10*3/uL (ref 0.1–1.0)
MONOS PCT: 11 %
Neutro Abs: 6.4 10*3/uL (ref 1.7–7.7)
Neutrophils Relative %: 75 %
PLATELETS: 186 10*3/uL (ref 150–400)
RBC: 3.46 MIL/uL — ABNORMAL LOW (ref 3.87–5.11)
RDW: 15.7 % — AB (ref 11.5–15.5)
WBC: 8.4 10*3/uL (ref 4.0–10.5)

## 2016-04-13 LAB — PHOSPHORUS: PHOSPHORUS: 2.8 mg/dL (ref 2.5–4.6)

## 2016-04-13 MED ORDER — POTASSIUM CHLORIDE CRYS ER 20 MEQ PO TBCR
40.0000 meq | EXTENDED_RELEASE_TABLET | Freq: Two times a day (BID) | ORAL | Status: DC
  Administered 2016-04-13 – 2016-04-14 (×3): 40 meq via ORAL
  Filled 2016-04-13 (×3): qty 2

## 2016-04-13 NOTE — Evaluation (Signed)
Occupational Therapy Evaluation Patient Details Name: April Page MRN: [id number] DOB: 12/27/1945 Today's Date: 04/13/2016    History of Present Illness 71 yo female admitted on 04/11/16 following a fall at home resulting in a right hip fx. Pt underwent IM nail on 04/12/16. PMH significant for HTN, HLD, OA, CA, GERD, and is currently on oral chemotherapy.    Clinical Impression   Pt reports she was independent with ADL PTA. Currently pt requires mod-max assist for ADL and max assist for sit to stand from EOB. Pt presenting with pain, generalized weakness, R knee buckling with weight bearing impacting her independence and safety with ADL and functional mobility. At this time, feel SNF would be the best follow up option for the pt, however, pt and family would prefer d/c home. Recommending SNF but if pt/family refuses pt will need HHOT for follow up. Pt would benefit from continued skilled OT to address established goals.    Follow Up Recommendations  SNF;Supervision/Assistance - 24 hour (if pt refuses SNF will need HHOT for follow up)    Equipment Recommendations  3 in 1 bedside commode;Tub/shower bench    Recommendations for Other Services       Precautions / Restrictions Precautions Precautions: Fall Precaution Comments: fall led to current hospitalization Restrictions Weight Bearing Restrictions: Yes RLE Weight Bearing: Weight bearing as tolerated      Mobility Bed Mobility Overal bed mobility: Needs Assistance Bed Mobility: Supine to Sit;Sit to Supine     Supine to sit: Mod assist;HOB elevated Sit to supine: Max assist   General bed mobility comments: Assist for LEs to EOB, trunk, and scooting hips out to EOB. Cues for technique and hand placement thrhoughout.  Transfers Overall transfer level: Needs assistance Equipment used: Rolling walker (2 wheeled) Transfers: Sit to/from Stand Sit to Stand: Max assist;From elevated surface Stand pivot transfers: Mod assist        General transfer comment: Max assist for sit to stand from elevated bed. Multiple attempts with cues for hand placement and technique. R knee buckling throughout.    Balance Overall balance assessment: Needs assistance Sitting-balance support: Feet supported;Bilateral upper extremity supported Sitting balance-Leahy Scale: Poor     Standing balance support: Bilateral upper extremity supported Standing balance-Leahy Scale: Poor                              ADL Overall ADL's : Needs assistance/impaired Eating/Feeding: Set up;Sitting   Grooming: Moderate assistance;Sitting   Upper Body Bathing: Moderate assistance;Sitting   Lower Body Bathing: Maximal assistance;+2 for physical assistance;Sit to/from stand   Upper Body Dressing : Moderate assistance;Sitting   Lower Body Dressing: Maximal assistance;Sit to/from stand;+2 for physical assistance               Functional mobility during ADLs: Maximal assistance (for sit to stand with few small side steps )       Vision     Perception     Praxis      Pertinent Vitals/Pain Pain Assessment: 0-10 Pain Score: 5  Pain Location: R hip Pain Descriptors / Indicators: Aching;Grimacing;Guarding Pain Intervention(s): Limited activity within patient's tolerance;Monitored during session     Hand Dominance Right   Extremity/Trunk Assessment Upper Extremity Assessment Upper Extremity Assessment: Generalized weakness   Lower Extremity Assessment Lower Extremity Assessment: Defer to PT evaluation RLE Deficits / Details: Pt with normal post op pain and weakness. At least 3/5 ankle and 2/5 knee and  hip per gross functional assessment.    Cervical / Trunk Assessment Cervical / Trunk Assessment: Normal   Communication Communication Communication: No difficulties   Cognition Arousal/Alertness: Awake/alert Behavior During Therapy: Flat affect Overall Cognitive Status: Within Functional Limits for tasks  assessed                 General Comments: Pt is not very responsive to therapy questions and takes increased time to respond   General Comments       Exercises       Shoulder Instructions      Home Living Family/patient expects to be discharged to:: Private residence Living Arrangements: Alone Available Help at Discharge: Family;Available 24 hours/day Type of Home: Apartment Home Access: Level entry     Home Layout: One level     Bathroom Shower/Tub: Tub/shower unit;Curtain Shower/tub characteristics: Architectural technologist: Standard Bathroom Accessibility: Yes   Home Equipment: None          Prior Functioning/Environment Level of Independence: Independent                 OT Problem List: Decreased strength;Decreased range of motion;Decreased activity tolerance;Impaired balance (sitting and/or standing);Decreased knowledge of use of DME or AE;Decreased knowledge of precautions;Pain;Increased edema   OT Treatment/Interventions: Self-care/ADL training;Energy conservation;DME and/or AE instruction;Therapeutic activities;Patient/family education;Balance training;Therapeutic exercise    OT Goals(Current goals can be found in the care plan section) Acute Rehab OT Goals Patient Stated Goal: to go home OT Goal Formulation: With patient/family Time For Goal Achievement: 04/27/16 Potential to Achieve Goals: Good ADL Goals Pt Will Perform Lower Body Bathing: with min guard assist;sit to/from stand Pt Will Perform Lower Body Dressing: with min guard assist;sit to/from stand Pt Will Transfer to Toilet: with min assist;ambulating;bedside commode (over toilet) Pt Will Perform Toileting - Clothing Manipulation and hygiene: with min guard assist;sit to/from stand Pt Will Perform Tub/Shower Transfer: Tub transfer;with min guard assist;ambulating;tub bench;rolling walker  OT Frequency: Min 2X/week   Barriers to D/C:            Co-evaluation               End of Session Equipment Utilized During Treatment: Gait belt;Rolling walker  Activity Tolerance: Patient limited by pain Patient left: in bed;with call bell/phone within reach;with family/visitor present   Time: 2423-5361 OT Time Calculation (min): 19 min Charges:  OT General Charges $OT Visit: 1 Procedure OT Evaluation $OT Eval Moderate Complexity: 1 Procedure G-Codes:     Binnie Kand M.S., OTR/L Pager: [phone]  04/13/2016, 4:36 PM

## 2016-04-13 NOTE — Evaluation (Signed)
Physical Therapy Evaluation Patient Details Name: April Page MRN: [id number] DOB: May 02, 1945 Today's Date: 04/13/2016   History of Present Illness  71 yo female admitted on 04/11/16 following a fall at home resulting in a right hip fx. Pt underwent IM nail on 04/12/16. PMH significant for HTN, HLD, OA, CA, GERD, and is currently on oral chemotherapy.   Clinical Impression  Pt presents POD 1 following the above procedure. Prior to admission, pt lived alone in a single level apartment and was completely independent. Pt would like to return home with assistance from her family. During this assessment, pt requires Min to Mod A for all mobility including stand pivot transfer to recliner. With current mobility pt would be more appropriate for SNF for short term rehab but if function improved HHPT may be appropriate. Pt will benefit from continued skilled PT services to address below deficits.     Follow Up Recommendations Home health PT;Supervision/Assistance - 24 hour VS SNF    Equipment Recommendations  Rolling walker with 5" wheels;3in1 (PT)    Recommendations for Other Services       Precautions / Restrictions Precautions Precautions: Fall Precaution Comments: fall led to current hospitalization Restrictions Weight Bearing Restrictions: Yes RLE Weight Bearing: Weight bearing as tolerated      Mobility  Bed Mobility Overal bed mobility: Needs Assistance Bed Mobility: Supine to Sit     Supine to sit: Mod assist;HOB elevated     General bed mobility comments: Mod A to bring LE's EOB and assistance to achieve trunk upright at EOB  Transfers Overall transfer level: Needs assistance Equipment used: Rolling walker (2 wheeled) Transfers: Sit to/from [facility] Sit to Stand: Mod assist Stand pivot transfers: Mod assist       General transfer comment: Mod A to stand from EOB and MOD A while performing stand pivot with RW. Requires cues for sequencing and keeping  walker within BOS. Pt has buckeling on RLE with stance during transfer  Ambulation/Gait             General Gait Details: unable to perfrom this session  Stairs            Wheelchair Mobility    Modified Rankin (Stroke Patients Only)       Balance                                             Pertinent Vitals/Pain Pain Assessment: 0-10 Pain Score: 3  Pain Location: right hip Pain Descriptors / Indicators: Aching;Guarding;Grimacing Pain Intervention(s): Limited activity within patient's tolerance;Monitored during session;Premedicated before session;Ice applied    Home Living Family/patient expects to be discharged to:: Private residence Living Arrangements: Alone Available Help at Discharge: Family;Available 24 hours/day Type of Home: Apartment Home Access: Level entry     Home Layout: One level Home Equipment: None      Prior Function Level of Independence: Independent               Hand Dominance   Dominant Hand: Right    Extremity/Trunk Assessment   Upper Extremity Assessment Upper Extremity Assessment: Defer to OT evaluation    Lower Extremity Assessment Lower Extremity Assessment: RLE deficits/detail RLE Deficits / Details: Pt with normal post op pain and weakness. At least 3/5 ankle and 2/5 knee and hip per gross functional assessment.     Cervical / Trunk Assessment  Cervical / Trunk Assessment: Normal  Communication   Communication: No difficulties  Cognition Arousal/Alertness: Awake/alert Behavior During Therapy: Flat affect Overall Cognitive Status: Within Functional Limits for tasks assessed                 General Comments: Pt is not very responsive to therapy questions and takes increased time to respond    General Comments General comments (skin integrity, edema, etc.): Family is present throughout assessment and answers majority of questions.     Exercises     Assessment/Plan    PT  Assessment Patient needs continued PT services  PT Problem List Decreased strength;Decreased range of motion;Decreased activity tolerance;Decreased balance;Decreased mobility;Decreased knowledge of use of DME;Pain          PT Treatment Interventions DME instruction;Gait training;Functional mobility training;Therapeutic activities;Therapeutic exercise;Balance training;Patient/family education    PT Goals (Current goals can be found in the Care Plan section)  Acute Rehab PT Goals Patient Stated Goal: to go home PT Goal Formulation: With patient/family Time For Goal Achievement: 04/20/16 Potential to Achieve Goals: Fair    Frequency Min 5X/week   Barriers to discharge        Co-evaluation               End of Session Equipment Utilized During Treatment: Gait belt Activity Tolerance: Patient limited by pain Patient left: in chair;with call bell/phone within reach;with family/visitor present Nurse Communication: Mobility status;Patient requests pain meds         Time: 3383-2919 PT Time Calculation (min) (ACUTE ONLY): 39 min   Charges:   PT Evaluation $PT Eval Moderate Complexity: 1 Procedure PT Treatments $Therapeutic Activity: 23-37 mins   PT G Codes:        Scheryl Marten PT, DPT  [phone]  04/13/2016, 2:38 PM

## 2016-04-13 NOTE — Progress Notes (Signed)
     Subjective: 1 Day Post-Op Procedure(s) (LRB): INTRAMEDULLARY (IM) NAIL FEMORAL (Right)   Patient reports pain as mild, pain controlled.  No events throughout the night.  States that she did get up with PT 2 times earlier in the day.  Objective:   VITALS:   Vitals:   04/13/16 0151 04/13/16 0635  BP: (!) 148/68 (!) 149/71  Pulse: 81 85  Resp: 15 16  Temp: 98.8 F (37.1 C) 99.5 F (37.5 C)    Dorsiflexion/Plantar flexion intact Incision: dressing C/D/I No cellulitis present Compartment soft  LABS  Recent Labs  04/12/16 0548 04/12/16 2054 04/13/16 1007  HGB 10.9* 11.3* 9.4*  HCT 33.4* 34.8* 29.2*  WBC 8.4 9.0 8.4  PLT 189 191 186     Recent Labs  04/11/16 2132  04/12/16 0917 04/12/16 2054 04/13/16 1007  NA 140  --  140  --  138  K 3.4*  --  3.2*  --  3.5  BUN 17  --  11  --  11  CREATININE 0.96  < > 0.79 0.86 1.01*  GLUCOSE 213*  --  248*  --  242*  < > = values in this interval not displayed.   Assessment/Plan: 1 Day Post-Op Procedure(s) (LRB): INTRAMEDULLARY (IM) NAIL FEMORAL (Right)  Up with therapy  Discharge disposition to be determined   West Pugh. Florence Yeung   PAC  04/13/2016, 7:00 PM

## 2016-04-13 NOTE — Progress Notes (Signed)
PROGRESS NOTE    April Page  YTK:[id number] DOB: [birth year] DOA: 04/11/2016 PCP: [name], MD   Brief Narrative:  Patient is a 71 year old African American obese female with a PMH of HTN, HLD, CML on Chemotherapy with Nilotinib (tasigna) 300 mg po BID, Arthritis, GERD, and other comorbidities who presented to [facility] because of feeling lightheaded after urinating resulting in subsequent fall and fracture of Right Hip. CT showed that she had a comminuted Right Femoral intertrochanteric fracture with basicervical fracture line and fracture extending into the proximal femoral diaphysis and a mildly displaced lesser trochanteric fragment noted. There was also superior joint space narrowing at the right hip, with associated sclerosis and cortical irregularity. Mild subcortical cyst formation noted. The CT scan also noted mild soft tissue injury within the musculature about the proximal right femur and mild soft tissue injury along the posterior aspect of the proximal right thigh. Dr. Stann Mainland of orthopedic surgery was contacted and he planned to do Surgery later on today. She was subsequently transferred to [facility] for the planned surgery. Patient underwent surgical intervention 04/12/16 by Dr. Corine Shelter and is POD1.    Assessment & Plan:   Principal Problem:   Hip fracture ([place]) Active Problems:   Diabetes type 2, uncontrolled (HCC)   Chronic myelogenous leukemia (CML), BCR-ABL1-positive (HCC)   S/P right hip fracture   Fall   Lightheadedness   Hypokalemia   Hyperglycemia   Essential hypertension   Hyperlipidemia  Acute Comminuted Right Femoral Intertrochanteric Fracture s/p Treatment of intertrochanteric, pertrochanteric, subtrochanteric fracture with intramedullary implant POD 1 -CT showed comminuted Right Femoral intertrochanteric fracture with basicervical fracture line and fracture extending into the proximal femoral diaphysis and a mildly displaced lesser trochanteric  fragment noted. There was also superior joint space narrowing at the right hip, with associated sclerosis and cortical irregularity. Mild subcortical cyst formation noted. The CT scan also noted mild soft tissue injury within the musculature about the proximal right femur and mild soft tissue injury along the posterior aspect of the proximal right thigh -Per Orthopedics Dr. Corine Shelter -PT/OT Recommend SNF and if she refuses HHPT/OT -Pain Control with Acetaminophen 650 mg po q6hprn for Mild Pain, Oxycodone IR 5-10 mg po q3hprn, Hydromorphone 1 mg IV q2hprn for Severe Pain, and Morphine 2 mg IV q2hprn Severe, and unresolved Breakthrough Pain -Bowel Regimen with Docusate 100 mg po BID  -C/w Robaxin for Muscle Spasm -Zofran and Reglan for N/V  Lightheadedness/PreSyncope  -Improved -Cardiac Troponin I x 3 Negative at <0.03 x 3 -No SOB, CP or Palpatations -Decrease IVF with D5W NS at 100 mL/hr to 50 mL/hr now that patient is eating -ECHO Done and Pending Read -Continue to Monitor on Telemetry  Hypokalemia -Patient's K+ was 3.2 -Improved after Repletion -Repeat CMP in AM  Hyperglycemia -Not on any Oral Hypoglycemics at Home -C/w Moderate Novolog SSI q4h for Tight Glucose control  -CBG's have been ranging from 105-201 -Continue To Monitor and Decrease IVF with D5W NS at 100 mL/hr to 50 mL/hr  Hypertension -Hold Home Medication of Lisinopril -C/w Hydralazine 10 mg IV q6hprn for SBP >180 or DBP>105  Hyperlipidemia -No Active Issues  CML -Sees Dr. Talbot Grumbling in Hematology -Discussed with Dr. Heath Lark and ok to Hold Nilotinib (tasigna) 300 mg po BID for up to 1 week -Will need to restart in near future now that Surgery is complete.   DVT prophylaxis: Heparin 5,000 sq Code Status: FULL CODE Family Communication: Discussed with Daughter at bedside Disposition Plan:  SNF vs. Home Health PT/OT; Needs Rolling Walker with 5" wheels and 3 in 1 commode  Consultants:   Orthopedic  Surgery   Procedures: Treatment of intertrochanteric, pertrochanteric, subtrochanteric fracture with intramedullary implant POD 1   Antimicrobials: Anti-infectives    Start     Dose/Rate Route Frequency Ordered Stop   04/13/16 0000  ceFAZolin (ANCEF) IVPB 2g/100 mL premix     2 g 200 mL/hr over 30 Minutes Intravenous Every 6 hours 04/12/16 2055 04/13/16 1307   04/12/16 2230  ceFAZolin (ANCEF) IVPB 2g/100 mL premix  Status:  Discontinued     2 g 200 mL/hr over 30 Minutes Intravenous Every 6 hours 04/12/16 2053 04/12/16 2055   04/12/16 1800  ceFAZolin (ANCEF) IVPB 2g/100 mL premix     2 g 200 mL/hr over 30 Minutes Intravenous To [facility] 04/12/16 1747 04/12/16 1841   04/12/16 1750  ceFAZolin (ANCEF) 2-4 GM/100ML-% IVPB    Comments:  Schonewitz, Leigh   : cabinet override      04/12/16 1750 04/12/16 1841     Subjective: Seen and examined at bedside and stated pain was well controlled. No Nausea or Vomiting. States surgery went well.   Objective: Vitals:   04/12/16 2030 04/12/16 2111 04/13/16 0151 04/13/16 0635  BP: (!) 150/90 (!) 150/72 (!) 148/68 (!) 149/71  Pulse:  72 81 85  Resp:  14 15 16   Temp: 97.4 F (36.3 C) 97.5 F (36.4 C) 98.8 F (37.1 C) 99.5 F (37.5 C)  TempSrc:  Axillary Oral Oral  SpO2:  94% 95% 94%  Weight:      Height:        Intake/Output Summary (Last 24 hours) at 04/13/16 1756 Last data filed at 04/13/16 1300  Gross per 24 hour  Intake             1580 ml  Output             1100 ml  Net              480 ml   Filed Weights   04/11/16 2015  Weight: 71.7 kg (158 lb)   Examination: Physical Exam:  Constitutional: WN/WD obese AAF, NAD and appears calm and comfortable Eyes: Lids and conjunctivae normal, sclerae anicteric  ENMT: External Ears, Nose appear normal. Grossly normal hearing. Neck: Appears normal, supple, no cervical masses, normal ROM, no appreciable thyromegaly, no JVD Respiratory: Clear to auscultation bilaterally, no  wheezing, rales, rhonchi or crackles. Normal respiratory effort and patient is not tachypenic. No accessory muscle use.  Cardiovascular: RRR, no murmurs / rubs / gallops. S1 and S2 auscultated. No extremity edema.  Abdomen: Soft, non-tender, non-distended. No masses palpated. No appreciable hepatosplenomegaly. Bowel sounds positive x4.  GU: Deferred. Musculoskeletal: No clubbing / cyanosis of digits/nails. No Contractures. Skin: No rashes, lesions, ulcers on limited skin evaluation. No induration; Warm and dry.  Neurologic: CN 2-12 grossly intact with no focal deficits. Sensation intact in all 4 Extremities. Romberg sign cerebellar reflexes not assessed.  Psychiatric: Normal judgment and insight. Alert and oriented x 3. Normal mood and appropriate affect.   Data Reviewed: I have personally reviewed following labs and imaging studies  CBC:  Recent Labs Lab 04/11/16 2132 04/12/16 0548 04/12/16 2054 04/13/16 1007  WBC 9.9 8.4 9.0 8.4  NEUTROABS 8.0*  --   --  6.4  HGB 12.4 10.9* 11.3* 9.4*  HCT 37.0 33.4* 34.8* 29.2*  MCV 83.1 83.5 84.1 84.4  PLT 237 189 191 186  Basic Metabolic Panel:  Recent Labs Lab 04/11/16 2132 04/12/16 0548 04/12/16 0917 04/12/16 1430 04/12/16 2054 04/13/16 1007  NA 140  --  140  --   --  138  K 3.4*  --  3.2*  --   --  3.5  CL 105  --  109  --   --  107  CO2 24  --  23  --   --  23  GLUCOSE 213*  --  248*  --   --  242*  BUN 17  --  11  --   --  11  CREATININE 0.96 0.79 0.79  --  0.86 1.01*  CALCIUM 9.8  --  9.2  --   --  8.7*  MG  --   --   --   --   --  1.7  PHOS  --   --   --  2.5  --  2.8   GFR: Estimated Creatinine Clearance: 49.6 mL/min (by C-G formula based on SCr of 1.01 mg/dL (H)). Liver Function Tests:  Recent Labs Lab 04/12/16 0917 04/13/16 1007  AST 22 26  ALT 20 19  ALKPHOS 85 76  BILITOT 1.7* 0.7  PROT 6.7 5.9*  ALBUMIN 3.4* 3.0*   No results for input(s): LIPASE, AMYLASE in the last 168 hours. No results for input(s):  AMMONIA in the last 168 hours. Coagulation Profile: No results for input(s): INR, PROTIME in the last 168 hours. Cardiac Enzymes:  Recent Labs Lab 04/11/16 2132 04/12/16 0917 04/12/16 1430 04/12/16 2054  CKTOTAL 197  --   --   --   TROPONINI  --  <0.03 <0.03 <0.03   BNP (last 3 results) No results for input(s): PROBNP in the last 8760 hours. HbA1C: No results for input(s): HGBA1C in the last 72 hours. CBG:  Recent Labs Lab 04/12/16 2344 04/13/16 0427 04/13/16 1009 04/13/16 1321 04/13/16 1616  GLUCAP 177* 171* 201* 165* 105*   Lipid Profile: No results for input(s): CHOL, HDL, LDLCALC, TRIG, CHOLHDL, LDLDIRECT in the last 72 hours. Thyroid Function Tests: No results for input(s): TSH, T4TOTAL, FREET4, T3FREE, THYROIDAB in the last 72 hours. Anemia Panel: No results for input(s): VITAMINB12, FOLATE, FERRITIN, TIBC, IRON, RETICCTPCT in the last 72 hours. Sepsis Labs: No results for input(s): PROCALCITON, LATICACIDVEN in the last 168 hours.  Recent Results (from the past 240 hour(s))  Surgical pcr screen     Status: None   Collection Time: 04/12/16  4:32 PM  Result Value Ref Range Status   MRSA, PCR NEGATIVE NEGATIVE Final   Staphylococcus aureus NEGATIVE NEGATIVE Final    Comment:        The Xpert SA Assay (FDA approved for NASAL specimens in patients over 64 years of age), is one component of a comprehensive surveillance program.  Test performance has been validated by [facility] for patients greater than or equal to 9 year old. It is not intended to diagnose infection nor to guide or monitor treatment.     Radiology Studies: Dg Chest 1 View  Result Date: 04/11/2016 CLINICAL DATA:  Status post fall, with concern for chest injury. Dizziness. Initial encounter. EXAM: CHEST 1 VIEW COMPARISON:  Chest radiograph performed 03/31/2015 FINDINGS: The lungs are well-aerated and clear. There is no evidence of focal opacification, pleural effusion or pneumothorax. The  cardiomediastinal silhouette is mildly enlarged. No acute osseous abnormalities are seen. There is prominence of the superior mediastinum, with rightward deviation of the trachea, raising concern for a superior  mediastinal mass. IMPRESSION: 1. No displaced rib fracture seen. 2. Mild cardiomegaly, with prominence of the superior mediastinum. Rightward deviation of the trachea. This raises concern for a superior mediastinal mass, better characterized than on prior studies. CT of the chest with contrast would be helpful for further evaluation, when and as deemed clinically appropriate. Electronically Signed   By: Garald Balding M.D.   On: 04/11/2016 22:04   Ct Hip Right Wo Contrast  Result Date: 04/12/2016 CLINICAL DATA:  Status post fall, with right hip pain. Initial encounter. EXAM: CT OF THE RIGHT HIP WITHOUT CONTRAST TECHNIQUE: Multidetector CT imaging of the right hip was performed according to the standard protocol. Multiplanar CT image reconstructions were also generated. COMPARISON:  Right hip radiographs performed earlier today at 9:29 p.m. FINDINGS: Bones/Joint/Cartilage There is a comminuted right femoral intertrochanteric fracture, with basicervical fracture line, and fracture line extending into the proximal femoral diaphysis. A mildly displaced lesser trochanteric fragment is seen. There is superior joint space narrowing at the right hip, with associated sclerosis and cortical irregularity. Mild subcortical cyst formation is seen. The cartilage is not well assessed. Ligaments Suboptimally assessed by CT. Muscles and Tendons Mild soft tissue injury is seen within the musculature about the proximal right femur. No definite focal tendon injuries are seen. Soft tissues Mild soft tissue injury is seen along the posterior aspect of the proximal right thigh. Scattered small and large bowel loops are grossly unremarkable. The bladder is moderately distended and grossly unremarkable. A pelvic ring is noted.  IMPRESSION: 1. Comminuted right femoral intertrochanteric fracture, with basicervical fracture line, and fracture line extending into the proximal femoral diaphysis. Mildly displaced lesser trochanteric fragment noted. 2. Superior joint space narrowing at the right hip, with associated sclerosis and cortical irregularity. Mild subcortical cyst formation noted. 3. Mild soft tissue injury within the musculature about the proximal right femur. Mild soft tissue injury along the posterior aspect of the proximal right thigh. Electronically Signed   By: Garald Balding M.D.   On: 04/12/2016 00:12   Dg C-arm 1-60 Min  Result Date: 04/12/2016 CLINICAL DATA:  IM nail.  Fluoro time 1 minutes 4 seconds. EXAM: DG C-ARM 61-120 MIN; OPERATIVE RIGHT HIP WITH PELVIS COMPARISON:  None. FINDINGS: Four images are submitted, demonstrating intramedullary nail and lag screw fixation of a right intertrochanteric fracture. No interval fractures are identified. No dislocation. IMPRESSION: Status post ORIF right femur. Electronically Signed   By: Nolon Nations M.D.   On: 04/12/2016 22:08   Dg Hip Operative Unilat With Pelvis Right  Result Date: 04/12/2016 CLINICAL DATA:  IM nail.  Fluoro time 1 minutes 4 seconds. EXAM: DG C-ARM 61-120 MIN; OPERATIVE RIGHT HIP WITH PELVIS COMPARISON:  None. FINDINGS: Four images are submitted, demonstrating intramedullary nail and lag screw fixation of a right intertrochanteric fracture. No interval fractures are identified. No dislocation. IMPRESSION: Status post ORIF right femur. Electronically Signed   By: Nolon Nations M.D.   On: 04/12/2016 22:08   Dg Hip Unilat With Pelvis 2-3 Views Right  Result Date: 04/11/2016 CLINICAL DATA:  Status post fall, with right hip pain. Initial encounter. EXAM: DG HIP (WITH OR WITHOUT PELVIS) 2-3V RIGHT COMPARISON:  None. FINDINGS: There is no evidence of fracture or dislocation. Both femoral heads are seated normally within their respective acetabula. The  proximal right femur appears intact. Mild superior joint space narrowing is noted at the right hip, with mild associated sclerosis. Mild degenerative change is noted at the lower lumbar spine. The visualized bowel  gas pattern is grossly unremarkable in appearance. Scattered phleboliths are noted within the pelvis. IMPRESSION: No evidence of fracture or dislocation. Mild superior joint space narrowing at the right hip. Electronically Signed   By: Garald Balding M.D.   On: 04/11/2016 22:02   Scheduled Meds: . docusate sodium  100 mg Oral BID  . heparin  5,000 Units Subcutaneous Q8H  . insulin aspart  0-15 Units Subcutaneous Q4H  . multivitamin with minerals  1 tablet Oral Daily  . potassium chloride  40 mEq Oral BID  . sodium chloride flush  3 mL Intravenous Q12H   Continuous Infusions: . dextrose 5 % and 0.9% NaCl 100 mL/hr at 04/13/16 0525  . lactated ringers 10 mL/hr at 04/12/16 1812     LOS: 1 day   Kerney Elbe, [place] [facility] Pager [phone]  If 7PM-7AM, please contact night-coverage [email or web address] Password [facility] 04/13/2016, 5:56 PM

## 2016-04-14 LAB — CBC WITH DIFFERENTIAL/PLATELET
BASOS ABS: 0 10*3/uL (ref 0.0–0.1)
Basophils Relative: 0 %
EOS ABS: 0.1 10*3/uL (ref 0.0–0.7)
Eosinophils Relative: 1 %
HCT: 26.3 % — ABNORMAL LOW (ref 36.0–46.0)
HEMOGLOBIN: 8.6 g/dL — AB (ref 12.0–15.0)
LYMPHS ABS: 2 10*3/uL (ref 0.7–4.0)
LYMPHS PCT: 26 %
MCH: 27.4 pg (ref 26.0–34.0)
MCHC: 32.7 g/dL (ref 30.0–36.0)
MCV: 83.8 fL (ref 78.0–100.0)
Monocytes Absolute: 0.8 10*3/uL (ref 0.1–1.0)
Monocytes Relative: 11 %
NEUTROS PCT: 62 %
Neutro Abs: 4.8 10*3/uL (ref 1.7–7.7)
Platelets: 160 10*3/uL (ref 150–400)
RBC: 3.14 MIL/uL — AB (ref 3.87–5.11)
RDW: 15.5 % (ref 11.5–15.5)
WBC: 7.7 10*3/uL (ref 4.0–10.5)

## 2016-04-14 LAB — COMPREHENSIVE METABOLIC PANEL
ALK PHOS: 68 U/L (ref 38–126)
ALT: 13 U/L — AB (ref 14–54)
AST: 19 U/L (ref 15–41)
Albumin: 2.8 g/dL — ABNORMAL LOW (ref 3.5–5.0)
Anion gap: 8 (ref 5–15)
BUN: 11 mg/dL (ref 6–20)
CALCIUM: 8.8 mg/dL — AB (ref 8.9–10.3)
CHLORIDE: 106 mmol/L (ref 101–111)
CO2: 23 mmol/L (ref 22–32)
CREATININE: 0.96 mg/dL (ref 0.44–1.00)
GFR calc non Af Amer: 58 mL/min — ABNORMAL LOW (ref >60)
GLUCOSE: 115 mg/dL — AB (ref 65–99)
Potassium: 3.9 mmol/L (ref 3.5–5.1)
SODIUM: 137 mmol/L (ref 135–145)
Total Bilirubin: 1.2 mg/dL (ref 0.3–1.2)
Total Protein: 5.8 g/dL — ABNORMAL LOW (ref 6.5–8.1)

## 2016-04-14 LAB — PHOSPHORUS: PHOSPHORUS: 3 mg/dL (ref 2.5–4.6)

## 2016-04-14 LAB — MAGNESIUM: Magnesium: 1.8 mg/dL (ref 1.7–2.4)

## 2016-04-14 LAB — GLUCOSE, CAPILLARY
GLUCOSE-CAPILLARY: 129 mg/dL — AB (ref 65–99)
GLUCOSE-CAPILLARY: 157 mg/dL — AB (ref 65–99)
GLUCOSE-CAPILLARY: 115 mg/dL — AB (ref 65–99)
Glucose-Capillary: 106 mg/dL — ABNORMAL HIGH (ref 65–99)
Glucose-Capillary: 116 mg/dL — ABNORMAL HIGH (ref 65–99)

## 2016-04-14 MED ORDER — LISINOPRIL 10 MG PO TABS
10.0000 mg | ORAL_TABLET | Freq: Every day | ORAL | Status: DC
  Administered 2016-04-14 – 2016-04-15 (×2): 10 mg via ORAL
  Filled 2016-04-14 (×2): qty 1

## 2016-04-14 NOTE — Progress Notes (Signed)
   Subjective: 2 Days Post-Op Procedure(s) (LRB): INTRAMEDULLARY (IM) NAIL FEMORAL (Right)  C/o mild pain to right hip today Therapy went well yesterday Denies any new symptoms or issues  Patient reports pain as mild.  Objective:   VITALS:   Vitals:   04/13/16 2025 04/14/16 0438  BP: (!) 144/70 (!) 164/71  Pulse: 82 93  Resp: 16 16  Temp: 98.3 F (36.8 C) 99.3 F (37.4 C)    Right hip incision healing well nv intact distally No rashes or edema  LABS  Recent Labs  04/12/16 2054 04/13/16 1007 04/14/16 0339  HGB 11.3* 9.4* 8.6*  HCT 34.8* 29.2* 26.3*  WBC 9.0 8.4 7.7  PLT 191 186 160     Recent Labs  04/12/16 0917 04/12/16 2054 04/13/16 1007 04/14/16 0339  NA 140  --  138 137  K 3.2*  --  3.5 3.9  BUN 11  --  11 11  CREATININE 0.79 0.86 1.01* 0.96  GLUCOSE 248*  --  242* 115*     Assessment/Plan: 2 Days Post-Op Procedure(s) (LRB): INTRAMEDULLARY (IM) NAIL FEMORAL (Right) Continue PT/OT D/c planning Pain management Pulmonary toilet    Brad Abby Tucholski, MPAS, PA-C  04/14/2016, 8:42 AM

## 2016-04-14 NOTE — Progress Notes (Addendum)
PROGRESS NOTE    April Page  MVE:[id number] DOB: 08-06-1945 DOA: 04/11/2016 PCP: [name], MD   Brief Narrative:  Patient is a 71 year old African American obese female with a PMH of HTN, HLD, CML on Chemotherapy with Nilotinib (tasigna) 300 mg po BID, Arthritis, GERD, and other comorbidities who presented to [facility] because of feeling lightheaded after urinating resulting in subsequent fall and fracture of Right Hip. CT showed that she had a comminuted Right Femoral intertrochanteric fracture with basicervical fracture line and fracture extending into the proximal femoral diaphysis and a mildly displaced lesser trochanteric fragment noted. There was also superior joint space narrowing at the right hip, with associated sclerosis and cortical irregularity. Mild subcortical cyst formation noted. The CT scan also noted mild soft tissue injury within the musculature about the proximal right femur and mild soft tissue injury along the posterior aspect of the proximal right thigh. Dr. Stann Mainland of orthopedic surgery was contacted and he planned to do Surgery later on today. She was subsequently transferred to [facility] for the planned surgery. Patient underwent surgical intervention 04/12/16 by Dr. Corine Shelter and is POD2.    Assessment & Plan:   Principal Problem:   Hip fracture ([place]) Active Problems:   Diabetes type 2, uncontrolled (HCC)   Chronic myelogenous leukemia (CML), BCR-ABL1-positive (HCC)   S/P right hip fracture   Fall   Lightheadedness   Hypokalemia   Hyperglycemia   Essential hypertension   Hyperlipidemia  Acute Comminuted Right Femoral Intertrochanteric Fracture s/p Treatment of intertrochanteric, pertrochanteric, subtrochanteric fracture with intramedullary Nail implant POD 2 -CT showed comminuted Right Femoral intertrochanteric fracture with basicervical fracture line and fracture extending into the proximal femoral diaphysis and a mildly displaced lesser trochanteric  fragment noted. There was also superior joint space narrowing at the right hip, with associated sclerosis and cortical irregularity. Mild subcortical cyst formation noted. The CT scan also noted mild soft tissue injury within the musculature about the proximal right femur and mild soft tissue injury along the posterior aspect of the proximal right thigh -Per Orthopedics Dr. Corine Shelter -PT/OT Recommend SNF and if she refuses HHPT/OT; Patient wants to go home and states she has good support at home -Pain Control with Acetaminophen 650 mg po q6hprn for Mild Pain, Oxycodone IR 5-10 mg po q3hprn, Hydromorphone 1 mg IV q2hprn for Severe Pain, and Morphine 2 mg IV q2hprn Severe, and unresolved Breakthrough Pain -Bowel Regimen with Docusate 100 mg po BID  -C/w Robaxin for Muscle Spasm -Zofran and Reglan for N/V -Discussed with Dr. Veverly Fells of [place] and he recommends continuing therapy for at least another day or so and sending patient on Lovenox 40 mg sq daily as an outpatient if she decides to go home with Home Health PT/OT rather than going to SNF.  -Possible D/C tomorrow with Home Health PT/OT as she is refusing SNF.   Lightheadedness/PreSyncope  -Improved -Cardiac Troponin I x 3 Negative at <0.03 x 3 -No SOB, CP or Palpatations -Decrease IVF with D5W NS at 100 mL/hr to 50 mL/hr now that patient is eating -ECHO Done and Pending Read Still -Continue to Monitor on Telemetry  Post-operative Anemia -Hb/Hct went from 11.3/34.8 -> 8.6/26.3 -? Dilutional Component with IVF -No S/Sx of Bleeding -Continue to Monitor and follow CBC in AM  Hypokalemia -Patient's K+ was 3.9 -Improved after Repletion -Repeat CMP in AM  Hyperglycemia -Not on any Oral Hypoglycemics at Home -C/w Moderate Novolog SSI q4h for Tight Glucose control  -CBG's have  been ranging from 106-128 -Continue To Monitor and Decrease IVF with D5W NS at 100 mL/hr to 50 mL/hr  Hypertension -Restarted Home Medication of Lisinopril -C/w  Hydralazine 10 mg IV q6hprn for SBP >180 or DBP>105  Hyperlipidemia -No Active Issues  CML -Sees Dr. Talbot Grumbling in Hematology -Discussed with Dr. Heath Lark and ok to Hold Nilotinib (tasigna) 300 mg po BID for up to 1 week -Will need to restart in near future now that Surgery is complete.   DVT prophylaxis: Heparin 5,000 sq q8h; Will need Lovenox 40 mg sq at D/C Code Status: FULL CODE Family Communication: Discussed with Daughter at bedside Disposition Plan: SNF vs. Home Health PT/OT; Needs Rolling Walker with 5" wheels and 3 in 1 commode  Consultants:   Orthopedic Surgery Dr. Ramonita Lab  -Discussed with Ortho Dr. Veverly Fells about Reccomendations   Procedures: Treatment of intertrochanteric, pertrochanteric, subtrochanteric fracture with intramedullary nail implant POD 2   Antimicrobials: Anti-infectives    Start     Dose/Rate Route Frequency Ordered Stop   04/13/16 0000  ceFAZolin (ANCEF) IVPB 2g/100 mL premix     2 g 200 mL/hr over 30 Minutes Intravenous Every 6 hours 04/12/16 2055 04/13/16 1307   04/12/16 2230  ceFAZolin (ANCEF) IVPB 2g/100 mL premix  Status:  Discontinued     2 g 200 mL/hr over 30 Minutes Intravenous Every 6 hours 04/12/16 2053 04/12/16 2055   04/12/16 1800  ceFAZolin (ANCEF) IVPB 2g/100 mL premix     2 g 200 mL/hr over 30 Minutes Intravenous To [facility] 04/12/16 1747 04/12/16 1841   04/12/16 1750  ceFAZolin (ANCEF) 2-4 GM/100ML-% IVPB    Comments:  Schonewitz, Leigh   : cabinet override      04/12/16 1750 04/12/16 1841     Subjective: Seen and examined at bedside and had no complaints. States she was doing ok. No N/V/Abdominal Pain or Cp. Wants to go home with Home health. Will restart Home Lisinopril.   Objective: Vitals:   04/13/16 2025 04/14/16 0438 04/14/16 1340 04/14/16 1557  BP: (!) 144/70 (!) 164/71 (!) 155/77 (!) 162/81  Pulse: 82 93  97  Resp: 16 16  16   Temp: 98.3 F (36.8 C) 99.3 F (37.4 C)  98.3 F (36.8 C)  TempSrc: Oral  Oral  Oral  SpO2: 97% 97%  97%  Weight:      Height:        Intake/Output Summary (Last 24 hours) at 04/14/16 1603 Last data filed at 04/14/16 1403  Gross per 24 hour  Intake              240 ml  Output              300 ml  Net              -60 ml   Filed Weights   04/11/16 2015  Weight: 71.7 kg (158 lb)   Examination: Physical Exam:  Constitutional: WN/WD obese AAF, NAD and appears calm and comfortable Eyes: Lids and conjunctivae normal, sclerae anicteric  ENMT: External Ears, Nose appear normal. Grossly normal hearing. Neck: Appears normal, supple, no cervical masses, normal ROM, no appreciable thyromegaly, no JVD Respiratory: Clear to auscultation bilaterally, no wheezing, rales, rhonchi or crackles. Normal respiratory effort and patient is not tachypenic. No accessory muscle use.  Cardiovascular: RRR, no murmurs / rubs / gallops. S1 and S2 auscultated. No extremity edema.  Abdomen: Soft, non-tender, non-distended. No masses palpated. No appreciable hepatosplenomegaly. Bowel sounds positive x4.  GU: Deferred. Musculoskeletal: No clubbing / cyanosis of digits/nails. No Contractures. Skin: No rashes, lesions, ulcers on limited skin evaluation. No induration; Warm and dry.  Neurologic: CN 2-12 grossly intact with no focal deficits. Sensation intact in all 4 Extremities. Romberg sign cerebellar reflexes not assessed.  Psychiatric: Normal judgment and insight. Alert and oriented x 3. Normal mood and appropriate affect.   Data Reviewed: I have personally reviewed following labs and imaging studies  CBC:  Recent Labs Lab 04/11/16 2132 04/12/16 0548 04/12/16 2054 04/13/16 1007 04/14/16 0339  WBC 9.9 8.4 9.0 8.4 7.7  NEUTROABS 8.0*  --   --  6.4 4.8  HGB 12.4 10.9* 11.3* 9.4* 8.6*  HCT 37.0 33.4* 34.8* 29.2* 26.3*  MCV 83.1 83.5 84.1 84.4 83.8  PLT 237 189 191 186 465   Basic Metabolic Panel:  Recent Labs Lab 04/11/16 2132 04/12/16 0548 04/12/16 0917 04/12/16 1430  04/12/16 2054 04/13/16 1007 04/14/16 0339  NA 140  --  140  --   --  138 137  K 3.4*  --  3.2*  --   --  3.5 3.9  CL 105  --  109  --   --  107 106  CO2 24  --  23  --   --  23 23  GLUCOSE 213*  --  248*  --   --  242* 115*  BUN 17  --  11  --   --  11 11  CREATININE 0.96 0.79 0.79  --  0.86 1.01* 0.96  CALCIUM 9.8  --  9.2  --   --  8.7* 8.8*  MG  --   --   --   --   --  1.7 1.8  PHOS  --   --   --  2.5  --  2.8 3.0   GFR: Estimated Creatinine Clearance: 52.2 mL/min (by C-G formula based on SCr of 0.96 mg/dL). Liver Function Tests:  Recent Labs Lab 04/12/16 0917 04/13/16 1007 04/14/16 0339  AST 22 26 19   ALT 20 19 13*  ALKPHOS 85 76 68  BILITOT 1.7* 0.7 1.2  PROT 6.7 5.9* 5.8*  ALBUMIN 3.4* 3.0* 2.8*   No results for input(s): LIPASE, AMYLASE in the last 168 hours. No results for input(s): AMMONIA in the last 168 hours. Coagulation Profile: No results for input(s): INR, PROTIME in the last 168 hours. Cardiac Enzymes:  Recent Labs Lab 04/11/16 2132 04/12/16 0917 04/12/16 1430 04/12/16 2054  CKTOTAL 197  --   --   --   TROPONINI  --  <0.03 <0.03 <0.03   BNP (last 3 results) No results for input(s): PROBNP in the last 8760 hours. HbA1C: No results for input(s): HGBA1C in the last 72 hours. CBG:  Recent Labs Lab 04/13/16 1616 04/13/16 2027 04/14/16 0040 04/14/16 0441 04/14/16 1120  GLUCAP 105* 131* 106* 116* 129*   Lipid Profile: No results for input(s): CHOL, HDL, LDLCALC, TRIG, CHOLHDL, LDLDIRECT in the last 72 hours. Thyroid Function Tests: No results for input(s): TSH, T4TOTAL, FREET4, T3FREE, THYROIDAB in the last 72 hours. Anemia Panel: No results for input(s): VITAMINB12, FOLATE, FERRITIN, TIBC, IRON, RETICCTPCT in the last 72 hours. Sepsis Labs: No results for input(s): PROCALCITON, LATICACIDVEN in the last 168 hours.  Recent Results (from the past 240 hour(s))  Surgical pcr screen     Status: None   Collection Time: 04/12/16  4:32 PM    Result Value Ref Range Status   MRSA, PCR NEGATIVE NEGATIVE Final  Staphylococcus aureus NEGATIVE NEGATIVE Final    Comment:        The Xpert SA Assay (FDA approved for NASAL specimens in patients over 13 years of age), is one component of a comprehensive surveillance program.  Test performance has been validated by [facility] for patients greater than or equal to 36 year old. It is not intended to diagnose infection nor to guide or monitor treatment.     Radiology Studies: Dg C-arm 1-60 Min  Result Date: 04/12/2016 CLINICAL DATA:  IM nail.  Fluoro time 1 minutes 4 seconds. EXAM: DG C-ARM 61-120 MIN; OPERATIVE RIGHT HIP WITH PELVIS COMPARISON:  None. FINDINGS: Four images are submitted, demonstrating intramedullary nail and lag screw fixation of a right intertrochanteric fracture. No interval fractures are identified. No dislocation. IMPRESSION: Status post ORIF right femur. Electronically Signed   By: Nolon Nations M.D.   On: 04/12/2016 22:08   Dg Hip Operative Unilat With Pelvis Right  Result Date: 04/12/2016 CLINICAL DATA:  IM nail.  Fluoro time 1 minutes 4 seconds. EXAM: DG C-ARM 61-120 MIN; OPERATIVE RIGHT HIP WITH PELVIS COMPARISON:  None. FINDINGS: Four images are submitted, demonstrating intramedullary nail and lag screw fixation of a right intertrochanteric fracture. No interval fractures are identified. No dislocation. IMPRESSION: Status post ORIF right femur. Electronically Signed   By: Nolon Nations M.D.   On: 04/12/2016 22:08   Scheduled Meds: . docusate sodium  100 mg Oral BID  . heparin  5,000 Units Subcutaneous Q8H  . insulin aspart  0-15 Units Subcutaneous Q4H  . lisinopril  10 mg Oral Daily  . multivitamin with minerals  1 tablet Oral Daily  . potassium chloride  40 mEq Oral BID  . sodium chloride flush  3 mL Intravenous Q12H   Continuous Infusions: . dextrose 5 % and 0.9% NaCl 100 mL/hr at 04/13/16 0525  . lactated ringers 10 mL/hr at 04/12/16 1812     LOS: 2 days   Kerney Elbe, DO [facility] Pager [phone]  If 7PM-7AM, please contact night-coverage [email or web address] Password [email or web address] 04/14/2016, 4:03 PM

## 2016-04-14 NOTE — Progress Notes (Signed)
Physical Therapy Treatment Patient Details Name: April Page MRN: [id number] DOB: 10/24/1945 Today's Date: 04/14/2016    History of Present Illness 71 yo female admitted on 04/11/16 following a fall at home resulting in a right hip fx. Pt underwent IM nail on 04/12/16. PMH significant for HTN, HLD, OA, CA, GERD, and is currently on oral chemotherapy.     PT Comments    Pt presents with improved bed mobs, transfers and stand pivot transfers this session. Pt is able to stand at [facility] with Min guard assist but requires Min-Mod A to advance RLE during gait as pt has increased difficulty performing swing phase. Pt is able to weight bear through RLE without buckling this session. Family continues to want to take pt home and is not considering SNF placement at this time. Pt will need 24 hr care, a WC or transport WC and below equipment if she does return home.     Follow Up Recommendations  Home health PT;Supervision/Assistance - 24 hour     Equipment Recommendations  Rolling walker with 5" wheels;3in1 (PT);Other (comment) (transport WC)    Recommendations for Other Services       Precautions / Restrictions Precautions Precautions: Fall Precaution Comments: fall led to current hospitalization Restrictions Weight Bearing Restrictions: Yes RLE Weight Bearing: Weight bearing as tolerated    Mobility  Bed Mobility Overal bed mobility: Needs Assistance Bed Mobility: Supine to Sit     Supine to sit: Mod assist;HOB elevated     General bed mobility comments: Assist for LEs to EOB, trunk, and scooting hips out to EOB. Cues for technique and hand placement thrhoughout.  Transfers Overall transfer level: Needs assistance Equipment used: Rolling walker (2 wheeled) Transfers: Sit to/from Stand Sit to Stand: Min assist;From elevated surface Stand pivot transfers: Mod assist       General transfer comment: Min A for sit to stand from elevated surface. Mod A for pivot transfer to recliner  with assistance to advance RLE during transfer. Pt with improved weight bearing through RLE and improved sequencing with transfer requiring only 1 person assist this session.   Ambulation/Gait                 Stairs            Wheelchair Mobility    Modified Rankin (Stroke Patients Only)       Balance Overall balance assessment: Needs assistance Sitting-balance support: Feet supported;Single extremity supported Sitting balance-Leahy Scale: Fair Sitting balance - Comments: Improved sitting balance at EOB this session   Standing balance support: Bilateral upper extremity supported Standing balance-Leahy Scale: Poor Standing balance comment: continues to rely on RW for support                    Cognition Arousal/Alertness: Awake/alert Behavior During Therapy: Flat affect Overall Cognitive Status: Within Functional Limits for tasks assessed                 General Comments: Pt more responsive to questions and more positive this session    Exercises Total Joint Exercises Ankle Circles/Pumps: AROM;Both;20 reps;Supine Quad Sets: AROM;Right;5 reps;Supine Hip ABduction/ADduction: Right;10 reps;AAROM;Supine    General Comments        Pertinent Vitals/Pain Pain Assessment: 0-10 Pain Score: 3  Pain Location: R hip Pain Descriptors / Indicators: Aching;Grimacing;Guarding Pain Intervention(s): Monitored during session;Premedicated before session;Repositioned    Home Living  Prior Function            PT Goals (current goals can now be found in the care plan section) Acute Rehab PT Goals Patient Stated Goal: to go home Progress towards PT goals: Progressing toward goals    Frequency    Min 5X/week      PT Plan Current plan remains appropriate    Co-evaluation             End of Session Equipment Utilized During Treatment: Gait belt Activity Tolerance: Patient tolerated treatment well;Patient limited  by fatigue Patient left: in chair;with call bell/phone within reach;with family/visitor present     Time: 1130-1156 PT Time Calculation (min) (ACUTE ONLY): 26 min  Charges:  $Therapeutic Exercise: 8-22 mins $Therapeutic Activity: 8-22 mins                    G Codes:      Scheryl Marten PT, DPT  [phone]  04/14/2016, 1:02 PM

## 2016-04-15 ENCOUNTER — Encounter (HOSPITAL_COMMUNITY): Payer: Self-pay | Admitting: Orthopedic Surgery

## 2016-04-15 LAB — CBC WITH DIFFERENTIAL/PLATELET
Basophils Absolute: 0 10*3/uL (ref 0.0–0.1)
Basophils Relative: 0 %
Eosinophils Absolute: 0.2 10*3/uL (ref 0.0–0.7)
Eosinophils Relative: 2 %
HEMATOCRIT: 26.8 % — AB (ref 36.0–46.0)
HEMOGLOBIN: 8.8 g/dL — AB (ref 12.0–15.0)
LYMPHS ABS: 2 10*3/uL (ref 0.7–4.0)
Lymphocytes Relative: 25 %
MCH: 27.4 pg (ref 26.0–34.0)
MCHC: 32.8 g/dL (ref 30.0–36.0)
MCV: 83.5 fL (ref 78.0–100.0)
Monocytes Absolute: 0.8 10*3/uL (ref 0.1–1.0)
Monocytes Relative: 11 %
NEUTROS ABS: 4.8 10*3/uL (ref 1.7–7.7)
NEUTROS PCT: 62 %
Platelets: 184 10*3/uL (ref 150–400)
RBC: 3.21 MIL/uL — AB (ref 3.87–5.11)
RDW: 15.4 % (ref 11.5–15.5)
WBC: 7.7 10*3/uL (ref 4.0–10.5)

## 2016-04-15 LAB — COMPREHENSIVE METABOLIC PANEL
ALK PHOS: 80 U/L (ref 38–126)
ALT: 15 U/L (ref 14–54)
AST: 22 U/L (ref 15–41)
Albumin: 2.8 g/dL — ABNORMAL LOW (ref 3.5–5.0)
Anion gap: 6 (ref 5–15)
BUN: 11 mg/dL (ref 6–20)
CO2: 24 mmol/L (ref 22–32)
CREATININE: 0.82 mg/dL (ref 0.44–1.00)
Calcium: 9 mg/dL (ref 8.9–10.3)
Chloride: 106 mmol/L (ref 101–111)
Glucose, Bld: 108 mg/dL — ABNORMAL HIGH (ref 65–99)
Potassium: 3.8 mmol/L (ref 3.5–5.1)
SODIUM: 136 mmol/L (ref 135–145)
Total Bilirubin: 1 mg/dL (ref 0.3–1.2)
Total Protein: 5.8 g/dL — ABNORMAL LOW (ref 6.5–8.1)

## 2016-04-15 LAB — GLUCOSE, CAPILLARY
GLUCOSE-CAPILLARY: 113 mg/dL — AB (ref 65–99)
Glucose-Capillary: 103 mg/dL — ABNORMAL HIGH (ref 65–99)
Glucose-Capillary: 117 mg/dL — ABNORMAL HIGH (ref 65–99)
Glucose-Capillary: 121 mg/dL — ABNORMAL HIGH (ref 65–99)
Glucose-Capillary: 147 mg/dL — ABNORMAL HIGH (ref 65–99)

## 2016-04-15 LAB — PHOSPHORUS: PHOSPHORUS: 3.9 mg/dL (ref 2.5–4.6)

## 2016-04-15 LAB — MAGNESIUM: Magnesium: 2 mg/dL (ref 1.7–2.4)

## 2016-04-15 MED ORDER — RIVAROXABAN 10 MG PO TABS: 10.0000 mg | ORAL_TABLET | Freq: Every day | ORAL | 0 refills | Status: DC

## 2016-04-15 NOTE — Care Management Note (Signed)
Case Management Note  Patient Details  Name: April Page MRN: [id number] Date of Birth: 03-May-1945  Subjective/Objective:  71 yr old female s/p left hip IM Nailing.                  Action/Plan: Case manager spoke with patient and her daughter concerning [place] and DME needs. Choice was offered for Home Health agency. CM called referral to Stevie Kern, [place] liaison. Patient's daughter states they have access to a rolling walker, and they have a 3in1. CM ordered wheelchair for transport. .    Expected Discharge Date:  04/15/16               Expected Discharge Plan:  [place]  In-House Referral:  NA  Discharge planning Services  CM Consult  Post Acute Care Choice:  Durable Medical Equipment, Home Health Choice offered to:  Patient, Adult Children  DME Arranged:  Wheelchair manual DME Agency:  [place]:  PT, OT [facility] Agency:  [place]  Status of Service:  Completed, signed off  If discussed at [place] of Stay Meetings, dates discussed:    Additional Comments:  Ninfa Meeker, RN 04/15/2016, 2:42 PM

## 2016-04-15 NOTE — Progress Notes (Signed)
Physical Therapy Treatment Patient Details Name: Page Page MRN: [id number] DOB: May 25, 1945 Today's Date: 04/15/2016    History of Present Illness 71 yo female admitted on 04/11/16 following a fall at home resulting in a right hip fx. Pt underwent IM nail on 04/12/16. PMH significant for HTN, HLD, OA, CA, GERD, and is currently on oral chemotherapy.     PT Comments    Pt is making gradual progress toward mobility goals. Pt able to ambulate short distance to [facility] and then to recliner with +2 for safety. Granddaughter present and reported that pt will have 24 hour supervision/assist if d/c'd home. Pt and family continue to refuse SNF. Continue to progress as tolerated.   Follow Up Recommendations  Home health PT;Supervision/Assistance - 24 hour     Equipment Recommendations  Rolling walker with 5" wheels;3in1 (PT);Other (comment) (transport WC)    Recommendations for Other Services       Precautions / Restrictions Precautions Precautions: Fall Precaution Comments: fall led to current hospitalization Restrictions Weight Bearing Restrictions: Yes RLE Weight Bearing: Weight bearing as tolerated    Mobility  Bed Mobility Overal bed mobility: Needs Assistance Bed Mobility: Supine to Sit     Supine to sit: Max assist     General bed mobility comments: assist to bring bilat LE to EOB, elevate trunk into sitting, and scoot hips to EOB with use of bed pad; cues for sequencing and technique  Transfers Overall transfer level: Needs assistance Equipment used: Rolling walker (2 wheeled) Transfers: Sit to/from Stand Sit to Stand: Mod assist;+2 physical assistance Stand pivot transfers: Mod assist       General transfer comment: assist to power up into standing from EOB and BSC; cues for hand placement and foot placement  Ambulation/Gait Ambulation/Gait assistance: Mod assist;+2 safety/equipment Ambulation Distance (Feet):  (50ft X2) Assistive device: Rolling walker (2  wheeled) Gait Pattern/deviations: Step-to pattern;Decreased stance time - right;Decreased step length - left;Decreased weight shift to right;Trunk flexed;Narrow base of support     General Gait Details: seated break on BSC; assist for weight shifting and management of RW; multimodal cues for posture; max vc for sequencing, widening of BOS, safe use of AD, and weight shifting   Stairs            Wheelchair Mobility    Modified Rankin (Stroke Patients Only)       Balance Overall balance assessment: Needs assistance Sitting-balance support: Feet supported;Single extremity supported Sitting balance-Leahy Scale: Fair Sitting balance - Comments: Improved sitting balance at EOB this session   Standing balance support: Bilateral upper extremity supported Standing balance-Leahy Scale: Poor Standing balance comment: continues to rely on RW for support                    Cognition Arousal/Alertness: Awake/alert Behavior During Therapy: [facility] for tasks assessed/performed Overall Cognitive Status: Within Functional Limits for tasks assessed                      Exercises      General Comments General comments (skin integrity, edema, etc.): granddaughter present and reported pt will have 24 hour supervision/assist if d/c'd home      Pertinent Vitals/Pain Pain Assessment: 0-10 Pain Score: 2  Pain Location: R hip Pain Descriptors / Indicators: Guarding;Sore Pain Intervention(s): Limited activity within patient's tolerance;Monitored during session;Premedicated before session;Repositioned    Home Living  Prior Function            PT Goals (current goals can now be found in the care plan section) Acute Rehab PT Goals Patient Stated Goal: to go home Progress towards PT goals: Progressing toward goals    Frequency    Min 5X/week      PT Plan Current plan remains appropriate    Co-evaluation PT/OT/SLP  Co-Evaluation/Treatment: Yes Reason for Co-Treatment: For patient/therapist safety PT goals addressed during session: Mobility/safety with mobility;Proper use of DME OT goals addressed during session: ADL's and self-care;Proper use of Adaptive equipment and DME     End of Session Equipment Utilized During Treatment: Gait belt Activity Tolerance: Patient limited by fatigue;Patient tolerated treatment well Patient left: in chair;with call bell/phone within reach;with family/visitor present     Time: 3943-2003 PT Time Calculation (min) (ACUTE ONLY): 25 min  Charges:  $Gait Training: 8-22 mins                    G Codes:      Page Page, PTA Pager: [phone]   04/15/2016, 3:16 PM

## 2016-04-15 NOTE — Discharge Summary (Signed)
Physician Discharge Summary  April Page QAS:[id number] DOB: 1945-09-02 DOA: 04/11/2016  PCP: [name], MD  Admit date: 04/11/2016 Discharge date: 04/15/2016  Admitted From: Home Disposition:  Home with [place] because she refused SNF  Recommendations for Outpatient Follow-up:  1. Follow up with PCP in 1-2 weeks 2. Follow up with Dr. Ramonita Lab of Orthopedics in 2 weeks 3. Follow up with Dr. Whitney Muse in Hematology as an outpatient; Resume oral Chemotherapy at discharge 4. Take Xarelto 10 mg daily for 2 weeks per Dr. Aurea Graff reccomendations 5. Please obtain BMP/CBC in one week  Home Health: Yes Equipment/Devices: 3 in 1 Bedside Commode; Rolling Walker, Editor, commissioning, Manufacturing systems engineer  Discharge Condition: Stable CODE STATUS: FULL CODE  Diet recommendation: Heart Healthy   Brief/Interim Summary: Patient is a 71 year old African American obese female with a PMH of HTN, HLD, CML on Chemotherapy with Nilotinib (tasigna) 300 mg po BID, Arthritis, GERD, and other comorbidities who presented to [facility] because of feeling lightheaded after urinating resulting in subsequent fall and fracture of Right Hip. CT showed that she had a comminuted Right Femoral intertrochanteric fracture with basicervical fracture line and fracture extending into the proximal femoral diaphysis and a mildly displaced lesser trochanteric fragment noted. There was also superior joint space narrowing at the right hip, with associated sclerosis and cortical irregularity. Mild subcortical cyst formation noted. The CT scan also noted mild soft tissue injury within the musculature about the proximal right femur and mild soft tissue injury along the posterior aspect of the proximal right thigh. Dr. Stann Mainland of orthopedic surgery was contacted and he planned to do surgery so she was subsequently transferred to [facility] for the planned surgery. Patient underwent surgical intervention 04/12/16 by Dr. Corine Shelter and is  POD3. Doing well and improved. PT evaluated and recommended SNF but refused and wanted to go home with Home Health. Dr. Alvan Dame of Orthopedics was contacted and patient was ok to D/C from his standpoint. At this time patient is medically stable to D/C Home and follow up with PCP, Oncology, and with Orthopedics as an outpatient.   Discharge Diagnoses:  Principal Problem:   Hip fracture ([place]) Active Problems:   Diabetes type 2, uncontrolled ([place])   Chronic myelogenous leukemia (CML), BCR-ABL1-positive ([place])   S/P right hip fracture   Fall   Lightheadedness   Hypokalemia   Hyperglycemia   Essential hypertension   Hyperlipidemia  Acute Comminuted Right Femoral Intertrochanteric Fracture s/p Treatment of intertrochanteric, pertrochanteric, subtrochanteric fracture with intramedullary Nail implant POD 3 -CT showedcomminuted Right Femoral intertrochanteric fracture with basicervical fracture line and fracture extending into the proximal femoral diaphysis and a mildly displaced lesser trochanteric fragment noted. There was also superior joint space narrowing at the right hip, with associated sclerosis and cortical irregularity. Mild subcortical cyst formation noted. The CT scan also noted mild soft tissue injury within the musculature about the proximal right femur and mild soft tissue injury along the posterior aspect of the proximal right thigh -Per Orthopedics Dr. Corine Shelter -PT/OT Recommend SNF and if she refuses HHPT/OT; Patient wants to go home and states she has good support at home -Pain Control with Acetaminophen 650 mg po q6hprn for Mild Pain and Norco -Bowel Regimen with Docusate -Will send home on Xarelto 10 mg daily x 2 weeks per Dr. Aurea Graff Recs -Follow up with Dr. Corine Shelter as an outpatient in 2 weeks  Lightheadedness/PreSyncope -Improved; No Reoccurrence  -Cardiac Troponin I x 3 Negative at <0.03 x 3 -No SOB,  CP or Palpatations -IVF D/C'd -ECHO Done and showed Systolic Fxn of 85-46%  and Grade 2 Diastolic Dysfunction -Continue to Monitor on Telemetry  Post-operative Anemia -Hb/Hct went from 11.3/34.8 -> 8.6/26.3 -> 8.8/26.8 -? Dilutional Component with IVF received during hospitalization -No S/Sx of Bleeding -Continue to Monitor and follow CBC as an outpatient   Hypokalemia -Improved -Patient's K+ was 3.8 -Improved after Repletion -Repeat CMP in AM  Hyperglycemia -Not on any Oral Hypoglycemics at Home -C/w Moderate Novolog SSI q4h for Tight Glucose control  -CBG's have been ranging from 106-128 -Follow up with PCP as an outpatient  Hypertension -Restarted Home Medication of Lisinopril -Follow up with PCP as an outpatient  Hyperlipidemia -No Active Issues -Follow up with PCP as an outpatient  CML -Sees Dr. Whitney Muse in Oncology -Discussed with Dr. Heath Lark and ok to Hold Nilotinib (tasigna) 300 mg po BID for up to 1 week -Will need to restart at home -Follow up with Oncology as an outpatient   Grade 2 Diastolic Dysfuction -Euvolemic -Not in Decompensation -Outpatient follow up with PCP and defer to them to refer to Cardiology  Discharge Instructions  Discharge Instructions    Call MD for:  difficulty breathing, headache or visual disturbances    Complete by:  As directed    Call MD for:  extreme fatigue    Complete by:  As directed    Call MD for:  persistant dizziness or light-headedness    Complete by:  As directed    Call MD for:  persistant nausea and vomiting    Complete by:  As directed    Call MD for:  redness, tenderness, or signs of infection (pain, swelling, redness, odor or green/yellow discharge around incision site)    Complete by:  As directed    Call MD for:  severe uncontrolled pain    Complete by:  As directed    Call MD for:  temperature >100.4    Complete by:  As directed    DME Bedside commode    Complete by:  As directed    Patient needs a bedside commode to treat with the following condition:  Hip fracture (HCC)    Diet - low sodium heart healthy    Complete by:  As directed    Discharge instructions    Complete by:  As directed    Follow up with PCP, Orthopedics, Hematology as an outpatient. Take all medications as prescribed. If symptoms change or worsen please return to the ED for evaluation.   For home use only DME 4 wheeled rolling walker with seat    Complete by:  As directed    Patient needs a walker to treat with the following condition:  Hip fracture ([place])   For home use only DME Shower stool    Complete by:  As directed    For home use only DME lightweight manual wheelchair with seat cushion    Complete by:  As directed    Patient suffers from recent fracutre which impairs their ability to perform daily activities like Ambulating in the home. A Rolling Walkerwill not resolve issue with performing activities of daily living. A wheelchair will allow patient to safely perform daily activities. Patient is not able to propel themselves in the home using a standard weight wheelchair due to weakness. Patient can self propel in the lightweight wheelchair.  Accessories: elevating leg rests (ELRs), wheel locks, extensions and anti-tippers.   Increase activity slowly    Complete by:  As  directed      Allergies as of 04/15/2016   No Known Allergies     Medication List    TAKE these medications   HYDROcodone-acetaminophen 7.5-325 MG tablet Commonly known as:  NORCO Take 1-2 tablets by mouth every 6 (six) hours as needed for moderate pain.   lisinopril 10 MG tablet Commonly known as:  PRINIVIL,ZESTRIL Take 1 tablet (10 mg total) by mouth daily.   multivitamin with minerals Tabs tablet Take 1 tablet by mouth daily.   nilotinib 150 MG capsule Commonly known as:  TASIGNA Take 2 capsules (300 mg total) by mouth every 12 (twelve) hours.   ondansetron 4 MG disintegrating tablet Commonly known as:  ZOFRAN ODT Take 1 tablet (4 mg total) by mouth every 8 (eight) hours as needed for nausea or  vomiting.   rivaroxaban 10 MG Tabs tablet Commonly known as:  XARELTO Take 1 tablet (10 mg total) by mouth daily.            Durable Medical Equipment        Start     Ordered   04/15/16 1430  For home use only DME lightweight manual wheelchair with seat cushion  Once    Comments:  Patient suffers from recent fracutre which impairs their ability to perform daily activities like Ambulating in the home.  A Rolling Walkerwill not resolve issue with performing activities of daily living. A wheelchair will allow patient to safely perform daily activities. Patient is not able to propel themselves in the home using a standard weight wheelchair due to weakness. Patient can self propel in the lightweight wheelchair.  Accessories: elevating leg rests (ELRs), wheel locks, extensions and anti-tippers.   04/15/16 1430   04/15/16 0000  DME Bedside commode    Question:  Patient needs a bedside commode to treat with the following condition  Answer:  Hip fracture ([place])   04/15/16 1425   04/15/16 0000  For home use only DME Shower stool     04/15/16 1425   04/15/16 0000  For home use only DME 4 wheeled rolling walker with seat    Question:  Patient needs a walker to treat with the following condition  Answer:  Hip fracture ([place])   04/15/16 1425   04/15/16 0000  For home use only DME lightweight manual wheelchair with seat cushion    Comments:  Patient suffers from recent fracutre which impairs their ability to perform daily activities like Ambulating in the home. A Rolling Walkerwill not resolve issue with performing activities of daily living. A wheelchair will allow patient to safely perform daily activities. Patient is not able to propel themselves in the home using a standard weight wheelchair due to weakness. Patient can self propel in the lightweight wheelchair.  Accessories: elevating leg rests (ELRs), wheel locks, extensions and anti-tippers.   04/15/16 1434   04/14/16 1612  For home use only  DME Eelevated commode seat  Once     04/14/16 1612   04/14/16 1612  For home use only DME wheelchair cushion (seat and back)  Once     04/14/16 1612   04/14/16 1611  For home use only DME Walker rolling  Once    Question:  Patient needs a walker to treat with the following condition  Answer:  Hip fracture ([place])   04/14/16 1612     Follow-up Information    Nicholes Stairs, MD. Schedule an appointment as soon as possible for a visit in 2 week(s).  Specialty:  Orthopedic Surgery Why:  For wound re-check Contact information: [address] [place] [address] [phone]        [name], MD. Call in 1 week(s).   Specialty:  Internal Medicine Why:  Follow up in 1 week and call to schedule appointment  Contact information: [address] [place] [address] [phone]          No Known Allergies  Consultations: Orthopedic Surgery Dr. Heath Lark of Oncology contacted via Phone  Procedures/Studies: Dg Chest 1 View  Result Date: 04/11/2016 CLINICAL DATA:  Status post fall, with concern for chest injury. Dizziness. Initial encounter. EXAM: CHEST 1 VIEW COMPARISON:  Chest radiograph performed 03/31/2015 FINDINGS: The lungs are well-aerated and clear. There is no evidence of focal opacification, pleural effusion or pneumothorax. The cardiomediastinal silhouette is mildly enlarged. No acute osseous abnormalities are seen. There is prominence of the superior mediastinum, with rightward deviation of the trachea, raising concern for a superior mediastinal mass. IMPRESSION: 1. No displaced rib fracture seen. 2. Mild cardiomegaly, with prominence of the superior mediastinum. Rightward deviation of the trachea. This raises concern for a superior mediastinal mass, better characterized than on prior studies. CT of the chest with contrast would be helpful for further evaluation, when and as deemed clinically appropriate. Electronically Signed   By: Garald Balding M.D.   On: 04/11/2016 22:04   Ct Hip Right Wo Contrast  Result Date: 04/12/2016 CLINICAL DATA:  Status post fall, with right hip pain. Initial encounter. EXAM: CT OF THE RIGHT HIP WITHOUT CONTRAST TECHNIQUE: Multidetector CT imaging of the right hip was performed according to the standard protocol. Multiplanar CT image reconstructions were also generated. COMPARISON:  Right hip radiographs performed earlier today at 9:29 p.m. FINDINGS: Bones/Joint/Cartilage There is a comminuted right femoral intertrochanteric fracture, with basicervical fracture line, and fracture line extending into the proximal femoral diaphysis. A mildly displaced lesser trochanteric fragment is seen. There is superior joint space narrowing at the right hip, with associated sclerosis and cortical irregularity. Mild subcortical cyst formation is seen. The cartilage is not well assessed. Ligaments Suboptimally assessed by CT. Muscles and Tendons Mild soft tissue injury is seen within the musculature about the proximal right femur. No definite focal tendon injuries are seen. Soft tissues Mild soft tissue injury is seen along the posterior aspect of the proximal right thigh. Scattered small and large bowel loops are grossly unremarkable. The bladder is moderately distended and grossly unremarkable. A pelvic ring is noted. IMPRESSION: 1. Comminuted right femoral intertrochanteric fracture, with basicervical fracture line, and fracture line extending into the proximal femoral diaphysis. Mildly displaced lesser trochanteric fragment noted. 2. Superior joint space narrowing at the right hip, with associated sclerosis and cortical irregularity. Mild subcortical cyst formation noted. 3. Mild soft tissue injury within the musculature about the proximal right femur. Mild soft tissue injury along the posterior aspect of the proximal right thigh. Electronically Signed   By: Garald Balding M.D.   On: 04/12/2016 00:12   Dg C-arm 1-60 Min  Result  Date: 04/12/2016 CLINICAL DATA:  IM nail.  Fluoro time 1 minutes 4 seconds. EXAM: DG C-ARM 61-120 MIN; OPERATIVE RIGHT HIP WITH PELVIS COMPARISON:  None. FINDINGS: Four images are submitted, demonstrating intramedullary nail and lag screw fixation of a right intertrochanteric fracture. No interval fractures are identified. No dislocation. IMPRESSION: Status post ORIF right femur. Electronically Signed   By: Nolon Nations M.D.   On: 04/12/2016 22:08   Dg Hip Operative  Unilat With Pelvis Right  Result Date: 04/12/2016 CLINICAL DATA:  IM nail.  Fluoro time 1 minutes 4 seconds. EXAM: DG C-ARM 61-120 MIN; OPERATIVE RIGHT HIP WITH PELVIS COMPARISON:  None. FINDINGS: Four images are submitted, demonstrating intramedullary nail and lag screw fixation of a right intertrochanteric fracture. No interval fractures are identified. No dislocation. IMPRESSION: Status post ORIF right femur. Electronically Signed   By: Nolon Nations M.D.   On: 04/12/2016 22:08   Dg Hip Unilat With Pelvis 2-3 Views Right  Result Date: 04/11/2016 CLINICAL DATA:  Status post fall, with right hip pain. Initial encounter. EXAM: DG HIP (WITH OR WITHOUT PELVIS) 2-3V RIGHT COMPARISON:  None. FINDINGS: There is no evidence of fracture or dislocation. Both femoral heads are seated normally within their respective acetabula. The proximal right femur appears intact. Mild superior joint space narrowing is noted at the right hip, with mild associated sclerosis. Mild degenerative change is noted at the lower lumbar spine. The visualized bowel gas pattern is grossly unremarkable in appearance. Scattered phleboliths are noted within the pelvis. IMPRESSION: No evidence of fracture or dislocation. Mild superior joint space narrowing at the right hip. Electronically Signed   By: Garald Balding M.D.   On: 04/11/2016 22:02    ECHOCARDIOGRAM Study Conclusions  - Left ventricle: The cavity size was normal. There was moderate   focal basal and mild  concentric hypertrophy. Systolic function   was normal. The estimated ejection fraction was in the range of   60% to 65%. Wall motion was normal; there were no regional wall   motion abnormalities. Features are consistent with a pseudonormal   left ventricular filling pattern, with concomitant abnormal   relaxation and increased filling pressure (grade 2 diastolic   dysfunction). - Aortic valve: Poorly visualized. Trileaflet; mildly thickened,   mildly calcified leaflets. - Left atrium: The atrium was moderately dilated. - Pulmonary arteries: Systolic pressure could not be accurately   estimated.  Subjective: Seen and examined at bedside and doing well. Not in much pain. Has support at home and ready to be D/C'd.  Discharge Exam: Vitals:   04/15/16 0414 04/15/16 1138  BP: (!) 141/70 (!) 151/119  Pulse: 86   Resp:    Temp: 99.9 F (37.7 C)    Vitals:   04/14/16 1557 04/14/16 2118 04/15/16 0414 04/15/16 1138  BP: (!) 162/81 (!) 162/69 (!) 141/70 (!) 151/119  Pulse: 97 99 86   Resp: 16 16    Temp: 98.3 F (36.8 C) 98.4 F (36.9 C) 99.9 F (37.7 C)   TempSrc: Oral Oral    SpO2: 97% 100%    Weight:      Height:       General: Pt is alert, awake, not in acute distress Cardiovascular: RRR, S1/S2 +, no rubs, no gallops Respiratory: CTA bilaterally, no wheezing, no rhonchi Abdominal: Soft, NT, ND, bowel sounds + Extremities: no edema, no cyanosis  The results of significant diagnostics from this hospitalization (including imaging, microbiology, ancillary and laboratory) are listed below for reference.    Microbiology: Recent Results (from the past 240 hour(s))  Surgical pcr screen     Status: None   Collection Time: 04/12/16  4:32 PM  Result Value Ref Range Status   MRSA, PCR NEGATIVE NEGATIVE Final   Staphylococcus aureus NEGATIVE NEGATIVE Final    Comment:        The Xpert SA Assay (FDA approved for NASAL specimens in patients over 12 years of age), is one component  of  a comprehensive surveillance program.  Test performance has been validated by [facility] for patients greater than or equal to 39 year old. It is not intended to diagnose infection nor to guide or monitor treatment.      Labs: BNP (last 3 results) No results for input(s): BNP in the last 8760 hours. Basic Metabolic Panel:  Recent Labs Lab 04/11/16 2132  04/12/16 0917 04/12/16 1430 04/12/16 2054 04/13/16 1007 04/14/16 0339 04/15/16 0505  NA 140  --  140  --   --  138 137 136  K 3.4*  --  3.2*  --   --  3.5 3.9 3.8  CL 105  --  109  --   --  107 106 106  CO2 24  --  23  --   --  23 23 24   GLUCOSE 213*  --  248*  --   --  242* 115* 108*  BUN 17  --  11  --   --  11 11 11   CREATININE 0.96  < > 0.79  --  0.86 1.01* 0.96 0.82  CALCIUM 9.8  --  9.2  --   --  8.7* 8.8* 9.0  MG  --   --   --   --   --  1.7 1.8 2.0  PHOS  --   --   --  2.5  --  2.8 3.0 3.9  < > = values in this interval not displayed. Liver Function Tests:  Recent Labs Lab 04/12/16 0917 04/13/16 1007 04/14/16 0339 04/15/16 0505  AST 22 26 19 22   ALT 20 19 13* 15  ALKPHOS 85 76 68 80  BILITOT 1.7* 0.7 1.2 1.0  PROT 6.7 5.9* 5.8* 5.8*  ALBUMIN 3.4* 3.0* 2.8* 2.8*   No results for input(s): LIPASE, AMYLASE in the last 168 hours. No results for input(s): AMMONIA in the last 168 hours. CBC:  Recent Labs Lab 04/11/16 2132 04/12/16 0548 04/12/16 2054 04/13/16 1007 04/14/16 0339 04/15/16 0505  WBC 9.9 8.4 9.0 8.4 7.7 7.7  NEUTROABS 8.0*  --   --  6.4 4.8 4.8  HGB 12.4 10.9* 11.3* 9.4* 8.6* 8.8*  HCT 37.0 33.4* 34.8* 29.2* 26.3* 26.8*  MCV 83.1 83.5 84.1 84.4 83.8 83.5  PLT 237 189 191 186 160 184   Cardiac Enzymes:  Recent Labs Lab 04/11/16 2132 04/12/16 0917 04/12/16 1430 04/12/16 2054  CKTOTAL 197  --   --   --   TROPONINI  --  <0.03 <0.03 <0.03   BNP: Invalid input(s): POCBNP CBG:  Recent Labs Lab 04/14/16 2029 04/15/16 0051 04/15/16 0424 04/15/16 0833 04/15/16 1229   GLUCAP 115* 121* 117* 147* 113*   D-Dimer No results for input(s): DDIMER in the last 72 hours. Hgb A1c No results for input(s): HGBA1C in the last 72 hours. Lipid Profile No results for input(s): CHOL, HDL, LDLCALC, TRIG, CHOLHDL, LDLDIRECT in the last 72 hours. Thyroid function studies No results for input(s): TSH, T4TOTAL, T3FREE, THYROIDAB in the last 72 hours.  Invalid input(s): FREET3 Anemia work up No results for input(s): VITAMINB12, FOLATE, FERRITIN, TIBC, IRON, RETICCTPCT in the last 72 hours. Urinalysis No results found for: COLORURINE, APPEARANCEUR, [place], [place], GLUCOSEU, [place], [place], [place], PROTEINUR, UROBILINOGEN, NITRITE, LEUKOCYTESUR Sepsis Labs Invalid input(s): PROCALCITONIN,  WBC,  LACTICIDVEN Microbiology Recent Results (from the past 240 hour(s))  Surgical pcr screen     Status: None   Collection Time: 04/12/16  4:32 PM  Result Value Ref Range Status  MRSA, PCR NEGATIVE NEGATIVE Final   Staphylococcus aureus NEGATIVE NEGATIVE Final    Comment:        The Xpert SA Assay (FDA approved for NASAL specimens in patients over 42 years of age), is one component of a comprehensive surveillance program.  Test performance has been validated by [facility] for patients greater than or equal to 13 year old. It is not intended to diagnose infection nor to guide or monitor treatment.    Time coordinating discharge: Over 30 minutes  SIGNED:  Kerney Elbe, DO [facility] 04/15/2016, 2:34 PM Pager [phone]  If 7PM-7AM, please contact night-coverage [email or web address] Password [email or web address]

## 2016-04-15 NOTE — Progress Notes (Signed)
Occupational Therapy Treatment Patient Details Name: April Page MRN: [id number] DOB: 19-Jun-1945 Today's Date: 04/15/2016    History of present illness 71 yo female admitted on 04/11/16 following a fall at home resulting in a right hip fx. Pt underwent IM nail on 04/12/16. PMH significant for HTN, HLD, OA, CA, GERD, and is currently on oral chemotherapy.    OT comments  Pt moving much better today, able to use RW to ambulate to [facility]. Pt continues to require +2 for safety and extensive assist with LB ADLs and toileting. OT to continue to follow acutely  Follow Up Recommendations  SNF;Supervision/Assistance - 24 hour;Home health OT (pt/family refusing SNF)    Equipment Recommendations  3 in 1 bedside commode;Tub/shower bench;Other (comment) (ADL A/E kit)    Recommendations for Other Services      Precautions / Restrictions Precautions Precautions: Fall Precaution Comments: fall led to current hospitalization Restrictions Weight Bearing Restrictions: Yes RLE Weight Bearing: Weight bearing as tolerated       Mobility Bed Mobility Overal bed mobility: Needs Assistance Bed Mobility: Supine to Sit     Supine to sit: Max assist     General bed mobility comments: assist to bring bilat LE to EOB, elevate trunk into sitting, and scoot hips to EOB with use of bed pad; cues for sequencing and technique  Transfers Overall transfer level: Needs assistance Equipment used: Rolling walker (2 wheeled) Transfers: Sit to/from Stand Sit to Stand: Mod assist;+2 physical assistance Stand pivot transfers: Mod assist       General transfer comment: assist to power up into standing from EOB and BSC; cues for hand placement and foot placement    Balance Overall balance assessment: Needs assistance Sitting-balance support: Feet supported;Single extremity supported Sitting balance-Leahy Scale: Fair Sitting balance - Comments: Improved sitting balance at EOB this session   Standing balance  support: Bilateral upper extremity supported Standing balance-Leahy Scale: Poor Standing balance comment: continues to rely on RW for support                   ADL Overall ADL's : Needs assistance/impaired     Grooming: Sitting;Min guard       Lower Body Bathing: +2 for physical assistance;Sit to/from stand;Moderate assistance;Maximal assistance       Lower Body Dressing: Sit to/from stand;Total assistance   Toilet Transfer: RW;Ambulation;+2 for safety/equipment;Moderate assistance;BSC   Toileting- Clothing Manipulation and Hygiene: Maximal assistance;Sit to/from stand                                                Cognition   Behavior During Therapy: [facility] for tasks assessed/performed Overall Cognitive Status: Within Functional Limits for tasks assessed                                                 General Comments  pt pleasant and cooperative    Pertinent Vitals/ Pain       Pain Assessment: 0-10 Pain Score: 2  Pain Location: R hip Pain Descriptors / Indicators: Guarding;Sore Pain Intervention(s): Limited activity within patient's tolerance;Monitored during session;Premedicated before session;Repositioned  Frequency  Min 2X/week        Progress Toward Goals  OT Goals(current goals can now be found in the care plan section)  Progress towards OT goals: Progressing toward goals  Acute Rehab OT Goals Patient Stated Goal: to go home OT Goal Formulation: With patient/family  Plan Discharge plan remains appropriate    Co-evaluation    PT/OT/SLP Co-Evaluation/Treatment: Yes Reason for Co-Treatment: For patient/therapist safety PT goals addressed during session: Mobility/safety with mobility;Proper use of DME OT goals addressed during session: ADL's and self-care;Proper use of Adaptive equipment and DME      End of Session  Equipment Utilized During Treatment: Gait belt;Rolling walker;Other (comment) (BSC)   Activity Tolerance Patient limited by pain;Patient tolerated treatment well   Patient Left in bed;with call bell/phone within reach;with family/visitor present             Time: 0677-0340 OT Time Calculation (min): 36 min  Charges: OT General Charges $OT Visit: 1 Procedure OT Treatments $Therapeutic Activity: 8-22 mins  Britt Bottom 04/15/2016, 1:58 PM

## 2016-04-15 NOTE — Progress Notes (Signed)
Pt has left the floor via wheelchair with no signs of distress. IV removed. Pt verbalize discharge instructions.

## 2016-04-16 DIAGNOSIS — S72141D Displaced intertrochanteric fracture of right femur, subsequent encounter for closed fracture with routine healing: Secondary | ICD-10-CM | POA: Diagnosis not present

## 2016-04-16 DIAGNOSIS — W19XXXD Unspecified fall, subsequent encounter: Secondary | ICD-10-CM | POA: Diagnosis not present

## 2016-04-16 DIAGNOSIS — I10 Essential (primary) hypertension: Secondary | ICD-10-CM | POA: Diagnosis not present

## 2016-04-16 DIAGNOSIS — C9210 Chronic myeloid leukemia, BCR/ABL-positive, not having achieved remission: Secondary | ICD-10-CM | POA: Diagnosis not present

## 2016-04-16 DIAGNOSIS — L89322 Pressure ulcer of left buttock, stage 2: Secondary | ICD-10-CM | POA: Diagnosis not present

## 2016-04-16 DIAGNOSIS — L89312 Pressure ulcer of right buttock, stage 2: Secondary | ICD-10-CM | POA: Diagnosis not present

## 2016-04-16 DIAGNOSIS — S72121D Displaced fracture of lesser trochanter of right femur, subsequent encounter for closed fracture with routine healing: Secondary | ICD-10-CM | POA: Diagnosis not present

## 2016-04-16 DIAGNOSIS — E1165 Type 2 diabetes mellitus with hyperglycemia: Secondary | ICD-10-CM | POA: Diagnosis not present

## 2016-04-16 DIAGNOSIS — E669 Obesity, unspecified: Secondary | ICD-10-CM | POA: Diagnosis not present

## 2016-04-17 DIAGNOSIS — C9210 Chronic myeloid leukemia, BCR/ABL-positive, not having achieved remission: Secondary | ICD-10-CM | POA: Diagnosis not present

## 2016-04-17 DIAGNOSIS — L89312 Pressure ulcer of right buttock, stage 2: Secondary | ICD-10-CM | POA: Diagnosis not present

## 2016-04-17 DIAGNOSIS — I10 Essential (primary) hypertension: Secondary | ICD-10-CM | POA: Diagnosis not present

## 2016-04-17 DIAGNOSIS — E669 Obesity, unspecified: Secondary | ICD-10-CM | POA: Diagnosis not present

## 2016-04-17 DIAGNOSIS — W19XXXD Unspecified fall, subsequent encounter: Secondary | ICD-10-CM | POA: Diagnosis not present

## 2016-04-17 DIAGNOSIS — S72121D Displaced fracture of lesser trochanter of right femur, subsequent encounter for closed fracture with routine healing: Secondary | ICD-10-CM | POA: Diagnosis not present

## 2016-04-17 DIAGNOSIS — E1165 Type 2 diabetes mellitus with hyperglycemia: Secondary | ICD-10-CM | POA: Diagnosis not present

## 2016-04-17 DIAGNOSIS — S72141D Displaced intertrochanteric fracture of right femur, subsequent encounter for closed fracture with routine healing: Secondary | ICD-10-CM | POA: Diagnosis not present

## 2016-04-17 DIAGNOSIS — L89322 Pressure ulcer of left buttock, stage 2: Secondary | ICD-10-CM | POA: Diagnosis not present

## 2016-04-18 DIAGNOSIS — S72121D Displaced fracture of lesser trochanter of right femur, subsequent encounter for closed fracture with routine healing: Secondary | ICD-10-CM | POA: Diagnosis not present

## 2016-04-18 DIAGNOSIS — E1165 Type 2 diabetes mellitus with hyperglycemia: Secondary | ICD-10-CM | POA: Diagnosis not present

## 2016-04-18 DIAGNOSIS — W19XXXD Unspecified fall, subsequent encounter: Secondary | ICD-10-CM | POA: Diagnosis not present

## 2016-04-18 DIAGNOSIS — I10 Essential (primary) hypertension: Secondary | ICD-10-CM | POA: Diagnosis not present

## 2016-04-18 DIAGNOSIS — C9210 Chronic myeloid leukemia, BCR/ABL-positive, not having achieved remission: Secondary | ICD-10-CM | POA: Diagnosis not present

## 2016-04-18 DIAGNOSIS — L89312 Pressure ulcer of right buttock, stage 2: Secondary | ICD-10-CM | POA: Diagnosis not present

## 2016-04-18 DIAGNOSIS — E669 Obesity, unspecified: Secondary | ICD-10-CM | POA: Diagnosis not present

## 2016-04-18 DIAGNOSIS — L89322 Pressure ulcer of left buttock, stage 2: Secondary | ICD-10-CM | POA: Diagnosis not present

## 2016-04-18 DIAGNOSIS — S72141D Displaced intertrochanteric fracture of right femur, subsequent encounter for closed fracture with routine healing: Secondary | ICD-10-CM | POA: Diagnosis not present

## 2016-04-19 DIAGNOSIS — S72141D Displaced intertrochanteric fracture of right femur, subsequent encounter for closed fracture with routine healing: Secondary | ICD-10-CM | POA: Diagnosis not present

## 2016-04-19 DIAGNOSIS — W19XXXD Unspecified fall, subsequent encounter: Secondary | ICD-10-CM | POA: Diagnosis not present

## 2016-04-19 DIAGNOSIS — C9210 Chronic myeloid leukemia, BCR/ABL-positive, not having achieved remission: Secondary | ICD-10-CM | POA: Diagnosis not present

## 2016-04-19 DIAGNOSIS — L89312 Pressure ulcer of right buttock, stage 2: Secondary | ICD-10-CM | POA: Diagnosis not present

## 2016-04-19 DIAGNOSIS — E669 Obesity, unspecified: Secondary | ICD-10-CM | POA: Diagnosis not present

## 2016-04-19 DIAGNOSIS — I10 Essential (primary) hypertension: Secondary | ICD-10-CM | POA: Diagnosis not present

## 2016-04-19 DIAGNOSIS — E1165 Type 2 diabetes mellitus with hyperglycemia: Secondary | ICD-10-CM | POA: Diagnosis not present

## 2016-04-19 DIAGNOSIS — S72121D Displaced fracture of lesser trochanter of right femur, subsequent encounter for closed fracture with routine healing: Secondary | ICD-10-CM | POA: Diagnosis not present

## 2016-04-19 DIAGNOSIS — L89322 Pressure ulcer of left buttock, stage 2: Secondary | ICD-10-CM | POA: Diagnosis not present

## 2016-04-22 DIAGNOSIS — E119 Type 2 diabetes mellitus without complications: Secondary | ICD-10-CM | POA: Diagnosis not present

## 2016-04-22 DIAGNOSIS — Z1389 Encounter for screening for other disorder: Secondary | ICD-10-CM | POA: Diagnosis not present

## 2016-04-22 DIAGNOSIS — W19XXXD Unspecified fall, subsequent encounter: Secondary | ICD-10-CM | POA: Diagnosis not present

## 2016-04-22 DIAGNOSIS — E1165 Type 2 diabetes mellitus with hyperglycemia: Secondary | ICD-10-CM | POA: Diagnosis not present

## 2016-04-22 DIAGNOSIS — R634 Abnormal weight loss: Secondary | ICD-10-CM | POA: Diagnosis not present

## 2016-04-22 DIAGNOSIS — L89312 Pressure ulcer of right buttock, stage 2: Secondary | ICD-10-CM | POA: Diagnosis not present

## 2016-04-22 DIAGNOSIS — L89322 Pressure ulcer of left buttock, stage 2: Secondary | ICD-10-CM | POA: Diagnosis not present

## 2016-04-22 DIAGNOSIS — C9210 Chronic myeloid leukemia, BCR/ABL-positive, not having achieved remission: Secondary | ICD-10-CM | POA: Diagnosis not present

## 2016-04-22 DIAGNOSIS — E669 Obesity, unspecified: Secondary | ICD-10-CM | POA: Diagnosis not present

## 2016-04-22 DIAGNOSIS — I10 Essential (primary) hypertension: Secondary | ICD-10-CM | POA: Diagnosis not present

## 2016-04-22 DIAGNOSIS — S72121D Displaced fracture of lesser trochanter of right femur, subsequent encounter for closed fracture with routine healing: Secondary | ICD-10-CM | POA: Diagnosis not present

## 2016-04-22 DIAGNOSIS — C92Z0 Other myeloid leukemia not having achieved remission: Secondary | ICD-10-CM | POA: Diagnosis not present

## 2016-04-22 DIAGNOSIS — S72141D Displaced intertrochanteric fracture of right femur, subsequent encounter for closed fracture with routine healing: Secondary | ICD-10-CM | POA: Diagnosis not present

## 2016-04-23 DIAGNOSIS — E669 Obesity, unspecified: Secondary | ICD-10-CM | POA: Diagnosis not present

## 2016-04-23 DIAGNOSIS — S72121D Displaced fracture of lesser trochanter of right femur, subsequent encounter for closed fracture with routine healing: Secondary | ICD-10-CM | POA: Diagnosis not present

## 2016-04-23 DIAGNOSIS — W19XXXD Unspecified fall, subsequent encounter: Secondary | ICD-10-CM | POA: Diagnosis not present

## 2016-04-23 DIAGNOSIS — S72141D Displaced intertrochanteric fracture of right femur, subsequent encounter for closed fracture with routine healing: Secondary | ICD-10-CM | POA: Diagnosis not present

## 2016-04-23 DIAGNOSIS — I10 Essential (primary) hypertension: Secondary | ICD-10-CM | POA: Diagnosis not present

## 2016-04-23 DIAGNOSIS — L89312 Pressure ulcer of right buttock, stage 2: Secondary | ICD-10-CM | POA: Diagnosis not present

## 2016-04-23 DIAGNOSIS — L89322 Pressure ulcer of left buttock, stage 2: Secondary | ICD-10-CM | POA: Diagnosis not present

## 2016-04-23 DIAGNOSIS — C9210 Chronic myeloid leukemia, BCR/ABL-positive, not having achieved remission: Secondary | ICD-10-CM | POA: Diagnosis not present

## 2016-04-23 DIAGNOSIS — E1165 Type 2 diabetes mellitus with hyperglycemia: Secondary | ICD-10-CM | POA: Diagnosis not present

## 2016-04-24 ENCOUNTER — Other Ambulatory Visit (HOSPITAL_COMMUNITY): Payer: Self-pay | Admitting: Internal Medicine

## 2016-04-24 DIAGNOSIS — E1165 Type 2 diabetes mellitus with hyperglycemia: Secondary | ICD-10-CM | POA: Diagnosis not present

## 2016-04-24 DIAGNOSIS — S72141D Displaced intertrochanteric fracture of right femur, subsequent encounter for closed fracture with routine healing: Secondary | ICD-10-CM | POA: Diagnosis not present

## 2016-04-24 DIAGNOSIS — L89322 Pressure ulcer of left buttock, stage 2: Secondary | ICD-10-CM | POA: Diagnosis not present

## 2016-04-24 DIAGNOSIS — R1310 Dysphagia, unspecified: Secondary | ICD-10-CM

## 2016-04-24 DIAGNOSIS — E669 Obesity, unspecified: Secondary | ICD-10-CM | POA: Diagnosis not present

## 2016-04-24 DIAGNOSIS — C9210 Chronic myeloid leukemia, BCR/ABL-positive, not having achieved remission: Secondary | ICD-10-CM | POA: Diagnosis not present

## 2016-04-24 DIAGNOSIS — S72121D Displaced fracture of lesser trochanter of right femur, subsequent encounter for closed fracture with routine healing: Secondary | ICD-10-CM | POA: Diagnosis not present

## 2016-04-24 DIAGNOSIS — I10 Essential (primary) hypertension: Secondary | ICD-10-CM | POA: Diagnosis not present

## 2016-04-24 DIAGNOSIS — L89312 Pressure ulcer of right buttock, stage 2: Secondary | ICD-10-CM | POA: Diagnosis not present

## 2016-04-24 DIAGNOSIS — W19XXXD Unspecified fall, subsequent encounter: Secondary | ICD-10-CM | POA: Diagnosis not present

## 2016-04-26 DIAGNOSIS — E669 Obesity, unspecified: Secondary | ICD-10-CM | POA: Diagnosis not present

## 2016-04-26 DIAGNOSIS — S72141D Displaced intertrochanteric fracture of right femur, subsequent encounter for closed fracture with routine healing: Secondary | ICD-10-CM | POA: Diagnosis not present

## 2016-04-26 DIAGNOSIS — I10 Essential (primary) hypertension: Secondary | ICD-10-CM | POA: Diagnosis not present

## 2016-04-26 DIAGNOSIS — W19XXXD Unspecified fall, subsequent encounter: Secondary | ICD-10-CM | POA: Diagnosis not present

## 2016-04-26 DIAGNOSIS — L89322 Pressure ulcer of left buttock, stage 2: Secondary | ICD-10-CM | POA: Diagnosis not present

## 2016-04-26 DIAGNOSIS — L89312 Pressure ulcer of right buttock, stage 2: Secondary | ICD-10-CM | POA: Diagnosis not present

## 2016-04-26 DIAGNOSIS — E1165 Type 2 diabetes mellitus with hyperglycemia: Secondary | ICD-10-CM | POA: Diagnosis not present

## 2016-04-26 DIAGNOSIS — C9210 Chronic myeloid leukemia, BCR/ABL-positive, not having achieved remission: Secondary | ICD-10-CM | POA: Diagnosis not present

## 2016-04-26 DIAGNOSIS — S72121D Displaced fracture of lesser trochanter of right femur, subsequent encounter for closed fracture with routine healing: Secondary | ICD-10-CM | POA: Diagnosis not present

## 2016-04-30 DIAGNOSIS — S72141D Displaced intertrochanteric fracture of right femur, subsequent encounter for closed fracture with routine healing: Secondary | ICD-10-CM | POA: Diagnosis not present

## 2016-04-30 DIAGNOSIS — L89322 Pressure ulcer of left buttock, stage 2: Secondary | ICD-10-CM | POA: Diagnosis not present

## 2016-04-30 DIAGNOSIS — S72121D Displaced fracture of lesser trochanter of right femur, subsequent encounter for closed fracture with routine healing: Secondary | ICD-10-CM | POA: Diagnosis not present

## 2016-04-30 DIAGNOSIS — E1165 Type 2 diabetes mellitus with hyperglycemia: Secondary | ICD-10-CM | POA: Diagnosis not present

## 2016-04-30 DIAGNOSIS — I10 Essential (primary) hypertension: Secondary | ICD-10-CM | POA: Diagnosis not present

## 2016-04-30 DIAGNOSIS — C9210 Chronic myeloid leukemia, BCR/ABL-positive, not having achieved remission: Secondary | ICD-10-CM | POA: Diagnosis not present

## 2016-04-30 DIAGNOSIS — W19XXXD Unspecified fall, subsequent encounter: Secondary | ICD-10-CM | POA: Diagnosis not present

## 2016-04-30 DIAGNOSIS — L89312 Pressure ulcer of right buttock, stage 2: Secondary | ICD-10-CM | POA: Diagnosis not present

## 2016-04-30 DIAGNOSIS — E669 Obesity, unspecified: Secondary | ICD-10-CM | POA: Diagnosis not present

## 2016-05-01 DIAGNOSIS — S72121D Displaced fracture of lesser trochanter of right femur, subsequent encounter for closed fracture with routine healing: Secondary | ICD-10-CM | POA: Diagnosis not present

## 2016-05-01 DIAGNOSIS — E669 Obesity, unspecified: Secondary | ICD-10-CM | POA: Diagnosis not present

## 2016-05-01 DIAGNOSIS — C9210 Chronic myeloid leukemia, BCR/ABL-positive, not having achieved remission: Secondary | ICD-10-CM | POA: Diagnosis not present

## 2016-05-01 DIAGNOSIS — L89322 Pressure ulcer of left buttock, stage 2: Secondary | ICD-10-CM | POA: Diagnosis not present

## 2016-05-01 DIAGNOSIS — E1165 Type 2 diabetes mellitus with hyperglycemia: Secondary | ICD-10-CM | POA: Diagnosis not present

## 2016-05-01 DIAGNOSIS — I10 Essential (primary) hypertension: Secondary | ICD-10-CM | POA: Diagnosis not present

## 2016-05-01 DIAGNOSIS — W19XXXD Unspecified fall, subsequent encounter: Secondary | ICD-10-CM | POA: Diagnosis not present

## 2016-05-01 DIAGNOSIS — S72141D Displaced intertrochanteric fracture of right femur, subsequent encounter for closed fracture with routine healing: Secondary | ICD-10-CM | POA: Diagnosis not present

## 2016-05-01 DIAGNOSIS — L89312 Pressure ulcer of right buttock, stage 2: Secondary | ICD-10-CM | POA: Diagnosis not present

## 2016-05-03 ENCOUNTER — Ambulatory Visit (HOSPITAL_COMMUNITY)
Admission: RE | Admit: 2016-05-03 | Discharge: 2016-05-03 | Disposition: A | Discharge status: Home / Self Care | Payer: Medicare HMO | Source: Ambulatory Visit | Attending: Internal Medicine | Admitting: Internal Medicine

## 2016-05-03 DIAGNOSIS — S72121D Displaced fracture of lesser trochanter of right femur, subsequent encounter for closed fracture with routine healing: Secondary | ICD-10-CM | POA: Diagnosis not present

## 2016-05-03 DIAGNOSIS — W19XXXD Unspecified fall, subsequent encounter: Secondary | ICD-10-CM | POA: Diagnosis not present

## 2016-05-03 DIAGNOSIS — E669 Obesity, unspecified: Secondary | ICD-10-CM | POA: Diagnosis not present

## 2016-05-03 DIAGNOSIS — L89312 Pressure ulcer of right buttock, stage 2: Secondary | ICD-10-CM | POA: Diagnosis not present

## 2016-05-03 DIAGNOSIS — E1165 Type 2 diabetes mellitus with hyperglycemia: Secondary | ICD-10-CM | POA: Diagnosis not present

## 2016-05-03 DIAGNOSIS — S72141D Displaced intertrochanteric fracture of right femur, subsequent encounter for closed fracture with routine healing: Secondary | ICD-10-CM | POA: Diagnosis not present

## 2016-05-03 DIAGNOSIS — L89322 Pressure ulcer of left buttock, stage 2: Secondary | ICD-10-CM | POA: Diagnosis not present

## 2016-05-03 DIAGNOSIS — C9210 Chronic myeloid leukemia, BCR/ABL-positive, not having achieved remission: Secondary | ICD-10-CM | POA: Diagnosis not present

## 2016-05-03 DIAGNOSIS — R1310 Dysphagia, unspecified: Secondary | ICD-10-CM

## 2016-05-03 DIAGNOSIS — K224 Dyskinesia of esophagus: Secondary | ICD-10-CM | POA: Diagnosis not present

## 2016-05-03 DIAGNOSIS — I10 Essential (primary) hypertension: Secondary | ICD-10-CM | POA: Diagnosis not present

## 2016-05-06 DIAGNOSIS — S72141D Displaced intertrochanteric fracture of right femur, subsequent encounter for closed fracture with routine healing: Secondary | ICD-10-CM | POA: Diagnosis not present

## 2016-05-06 DIAGNOSIS — E1165 Type 2 diabetes mellitus with hyperglycemia: Secondary | ICD-10-CM | POA: Diagnosis not present

## 2016-05-06 DIAGNOSIS — E669 Obesity, unspecified: Secondary | ICD-10-CM | POA: Diagnosis not present

## 2016-05-06 DIAGNOSIS — S72121D Displaced fracture of lesser trochanter of right femur, subsequent encounter for closed fracture with routine healing: Secondary | ICD-10-CM | POA: Diagnosis not present

## 2016-05-06 DIAGNOSIS — I10 Essential (primary) hypertension: Secondary | ICD-10-CM | POA: Diagnosis not present

## 2016-05-06 DIAGNOSIS — W19XXXD Unspecified fall, subsequent encounter: Secondary | ICD-10-CM | POA: Diagnosis not present

## 2016-05-06 DIAGNOSIS — L89312 Pressure ulcer of right buttock, stage 2: Secondary | ICD-10-CM | POA: Diagnosis not present

## 2016-05-06 DIAGNOSIS — L89322 Pressure ulcer of left buttock, stage 2: Secondary | ICD-10-CM | POA: Diagnosis not present

## 2016-05-06 DIAGNOSIS — C9210 Chronic myeloid leukemia, BCR/ABL-positive, not having achieved remission: Secondary | ICD-10-CM | POA: Diagnosis not present

## 2016-05-07 DIAGNOSIS — S72141D Displaced intertrochanteric fracture of right femur, subsequent encounter for closed fracture with routine healing: Secondary | ICD-10-CM | POA: Diagnosis not present

## 2016-05-07 DIAGNOSIS — S72121D Displaced fracture of lesser trochanter of right femur, subsequent encounter for closed fracture with routine healing: Secondary | ICD-10-CM | POA: Diagnosis not present

## 2016-05-07 DIAGNOSIS — E669 Obesity, unspecified: Secondary | ICD-10-CM | POA: Diagnosis not present

## 2016-05-07 DIAGNOSIS — L89322 Pressure ulcer of left buttock, stage 2: Secondary | ICD-10-CM | POA: Diagnosis not present

## 2016-05-07 DIAGNOSIS — E1165 Type 2 diabetes mellitus with hyperglycemia: Secondary | ICD-10-CM | POA: Diagnosis not present

## 2016-05-07 DIAGNOSIS — C9210 Chronic myeloid leukemia, BCR/ABL-positive, not having achieved remission: Secondary | ICD-10-CM | POA: Diagnosis not present

## 2016-05-07 DIAGNOSIS — I10 Essential (primary) hypertension: Secondary | ICD-10-CM | POA: Diagnosis not present

## 2016-05-07 DIAGNOSIS — W19XXXD Unspecified fall, subsequent encounter: Secondary | ICD-10-CM | POA: Diagnosis not present

## 2016-05-07 DIAGNOSIS — L89312 Pressure ulcer of right buttock, stage 2: Secondary | ICD-10-CM | POA: Diagnosis not present

## 2016-05-08 DIAGNOSIS — E1165 Type 2 diabetes mellitus with hyperglycemia: Secondary | ICD-10-CM | POA: Diagnosis not present

## 2016-05-08 DIAGNOSIS — L89312 Pressure ulcer of right buttock, stage 2: Secondary | ICD-10-CM | POA: Diagnosis not present

## 2016-05-08 DIAGNOSIS — W19XXXD Unspecified fall, subsequent encounter: Secondary | ICD-10-CM | POA: Diagnosis not present

## 2016-05-08 DIAGNOSIS — L89322 Pressure ulcer of left buttock, stage 2: Secondary | ICD-10-CM | POA: Diagnosis not present

## 2016-05-08 DIAGNOSIS — I10 Essential (primary) hypertension: Secondary | ICD-10-CM | POA: Diagnosis not present

## 2016-05-08 DIAGNOSIS — S72141D Displaced intertrochanteric fracture of right femur, subsequent encounter for closed fracture with routine healing: Secondary | ICD-10-CM | POA: Diagnosis not present

## 2016-05-08 DIAGNOSIS — C9210 Chronic myeloid leukemia, BCR/ABL-positive, not having achieved remission: Secondary | ICD-10-CM | POA: Diagnosis not present

## 2016-05-08 DIAGNOSIS — E669 Obesity, unspecified: Secondary | ICD-10-CM | POA: Diagnosis not present

## 2016-05-08 DIAGNOSIS — S72121D Displaced fracture of lesser trochanter of right femur, subsequent encounter for closed fracture with routine healing: Secondary | ICD-10-CM | POA: Diagnosis not present

## 2016-05-10 DIAGNOSIS — S72121D Displaced fracture of lesser trochanter of right femur, subsequent encounter for closed fracture with routine healing: Secondary | ICD-10-CM | POA: Diagnosis not present

## 2016-05-10 DIAGNOSIS — S72141D Displaced intertrochanteric fracture of right femur, subsequent encounter for closed fracture with routine healing: Secondary | ICD-10-CM | POA: Diagnosis not present

## 2016-05-10 DIAGNOSIS — L89312 Pressure ulcer of right buttock, stage 2: Secondary | ICD-10-CM | POA: Diagnosis not present

## 2016-05-10 DIAGNOSIS — L89322 Pressure ulcer of left buttock, stage 2: Secondary | ICD-10-CM | POA: Diagnosis not present

## 2016-05-10 DIAGNOSIS — I10 Essential (primary) hypertension: Secondary | ICD-10-CM | POA: Diagnosis not present

## 2016-05-10 DIAGNOSIS — C9210 Chronic myeloid leukemia, BCR/ABL-positive, not having achieved remission: Secondary | ICD-10-CM | POA: Diagnosis not present

## 2016-05-10 DIAGNOSIS — E1165 Type 2 diabetes mellitus with hyperglycemia: Secondary | ICD-10-CM | POA: Diagnosis not present

## 2016-05-10 DIAGNOSIS — E669 Obesity, unspecified: Secondary | ICD-10-CM | POA: Diagnosis not present

## 2016-05-10 DIAGNOSIS — W19XXXD Unspecified fall, subsequent encounter: Secondary | ICD-10-CM | POA: Diagnosis not present

## 2016-05-13 DIAGNOSIS — S72121D Displaced fracture of lesser trochanter of right femur, subsequent encounter for closed fracture with routine healing: Secondary | ICD-10-CM | POA: Diagnosis not present

## 2016-05-13 DIAGNOSIS — E669 Obesity, unspecified: Secondary | ICD-10-CM | POA: Diagnosis not present

## 2016-05-13 DIAGNOSIS — L89322 Pressure ulcer of left buttock, stage 2: Secondary | ICD-10-CM | POA: Diagnosis not present

## 2016-05-13 DIAGNOSIS — I10 Essential (primary) hypertension: Secondary | ICD-10-CM | POA: Diagnosis not present

## 2016-05-13 DIAGNOSIS — L89312 Pressure ulcer of right buttock, stage 2: Secondary | ICD-10-CM | POA: Diagnosis not present

## 2016-05-13 DIAGNOSIS — W19XXXD Unspecified fall, subsequent encounter: Secondary | ICD-10-CM | POA: Diagnosis not present

## 2016-05-13 DIAGNOSIS — C9210 Chronic myeloid leukemia, BCR/ABL-positive, not having achieved remission: Secondary | ICD-10-CM | POA: Diagnosis not present

## 2016-05-13 DIAGNOSIS — E1165 Type 2 diabetes mellitus with hyperglycemia: Secondary | ICD-10-CM | POA: Diagnosis not present

## 2016-05-13 DIAGNOSIS — S72141D Displaced intertrochanteric fracture of right femur, subsequent encounter for closed fracture with routine healing: Secondary | ICD-10-CM | POA: Diagnosis not present

## 2016-05-14 DIAGNOSIS — W19XXXD Unspecified fall, subsequent encounter: Secondary | ICD-10-CM | POA: Diagnosis not present

## 2016-05-14 DIAGNOSIS — S72121D Displaced fracture of lesser trochanter of right femur, subsequent encounter for closed fracture with routine healing: Secondary | ICD-10-CM | POA: Diagnosis not present

## 2016-05-14 DIAGNOSIS — S72141D Displaced intertrochanteric fracture of right femur, subsequent encounter for closed fracture with routine healing: Secondary | ICD-10-CM | POA: Diagnosis not present

## 2016-05-14 DIAGNOSIS — I10 Essential (primary) hypertension: Secondary | ICD-10-CM | POA: Diagnosis not present

## 2016-05-14 DIAGNOSIS — L89322 Pressure ulcer of left buttock, stage 2: Secondary | ICD-10-CM | POA: Diagnosis not present

## 2016-05-14 DIAGNOSIS — C9210 Chronic myeloid leukemia, BCR/ABL-positive, not having achieved remission: Secondary | ICD-10-CM | POA: Diagnosis not present

## 2016-05-14 DIAGNOSIS — E1165 Type 2 diabetes mellitus with hyperglycemia: Secondary | ICD-10-CM | POA: Diagnosis not present

## 2016-05-14 DIAGNOSIS — L89312 Pressure ulcer of right buttock, stage 2: Secondary | ICD-10-CM | POA: Diagnosis not present

## 2016-05-14 DIAGNOSIS — E669 Obesity, unspecified: Secondary | ICD-10-CM | POA: Diagnosis not present

## 2016-05-17 DIAGNOSIS — E669 Obesity, unspecified: Secondary | ICD-10-CM | POA: Diagnosis not present

## 2016-05-17 DIAGNOSIS — S72121D Displaced fracture of lesser trochanter of right femur, subsequent encounter for closed fracture with routine healing: Secondary | ICD-10-CM | POA: Diagnosis not present

## 2016-05-17 DIAGNOSIS — W19XXXD Unspecified fall, subsequent encounter: Secondary | ICD-10-CM | POA: Diagnosis not present

## 2016-05-17 DIAGNOSIS — S72141D Displaced intertrochanteric fracture of right femur, subsequent encounter for closed fracture with routine healing: Secondary | ICD-10-CM | POA: Diagnosis not present

## 2016-05-17 DIAGNOSIS — E1165 Type 2 diabetes mellitus with hyperglycemia: Secondary | ICD-10-CM | POA: Diagnosis not present

## 2016-05-17 DIAGNOSIS — C9210 Chronic myeloid leukemia, BCR/ABL-positive, not having achieved remission: Secondary | ICD-10-CM | POA: Diagnosis not present

## 2016-05-17 DIAGNOSIS — I10 Essential (primary) hypertension: Secondary | ICD-10-CM | POA: Diagnosis not present

## 2016-05-17 DIAGNOSIS — L89322 Pressure ulcer of left buttock, stage 2: Secondary | ICD-10-CM | POA: Diagnosis not present

## 2016-05-17 DIAGNOSIS — L89312 Pressure ulcer of right buttock, stage 2: Secondary | ICD-10-CM | POA: Diagnosis not present

## 2016-05-21 DIAGNOSIS — C9210 Chronic myeloid leukemia, BCR/ABL-positive, not having achieved remission: Secondary | ICD-10-CM | POA: Diagnosis not present

## 2016-05-21 DIAGNOSIS — I10 Essential (primary) hypertension: Secondary | ICD-10-CM | POA: Diagnosis not present

## 2016-05-21 DIAGNOSIS — S72121D Displaced fracture of lesser trochanter of right femur, subsequent encounter for closed fracture with routine healing: Secondary | ICD-10-CM | POA: Diagnosis not present

## 2016-05-21 DIAGNOSIS — S72141D Displaced intertrochanteric fracture of right femur, subsequent encounter for closed fracture with routine healing: Secondary | ICD-10-CM | POA: Diagnosis not present

## 2016-05-21 DIAGNOSIS — E669 Obesity, unspecified: Secondary | ICD-10-CM | POA: Diagnosis not present

## 2016-05-21 DIAGNOSIS — W19XXXD Unspecified fall, subsequent encounter: Secondary | ICD-10-CM | POA: Diagnosis not present

## 2016-05-21 DIAGNOSIS — L89312 Pressure ulcer of right buttock, stage 2: Secondary | ICD-10-CM | POA: Diagnosis not present

## 2016-05-21 DIAGNOSIS — E1165 Type 2 diabetes mellitus with hyperglycemia: Secondary | ICD-10-CM | POA: Diagnosis not present

## 2016-05-21 DIAGNOSIS — L89322 Pressure ulcer of left buttock, stage 2: Secondary | ICD-10-CM | POA: Diagnosis not present

## 2016-05-23 DIAGNOSIS — E1165 Type 2 diabetes mellitus with hyperglycemia: Secondary | ICD-10-CM | POA: Diagnosis not present

## 2016-05-23 DIAGNOSIS — W19XXXD Unspecified fall, subsequent encounter: Secondary | ICD-10-CM | POA: Diagnosis not present

## 2016-05-23 DIAGNOSIS — L89312 Pressure ulcer of right buttock, stage 2: Secondary | ICD-10-CM | POA: Diagnosis not present

## 2016-05-23 DIAGNOSIS — L89322 Pressure ulcer of left buttock, stage 2: Secondary | ICD-10-CM | POA: Diagnosis not present

## 2016-05-23 DIAGNOSIS — E669 Obesity, unspecified: Secondary | ICD-10-CM | POA: Diagnosis not present

## 2016-05-23 DIAGNOSIS — C9210 Chronic myeloid leukemia, BCR/ABL-positive, not having achieved remission: Secondary | ICD-10-CM | POA: Diagnosis not present

## 2016-05-23 DIAGNOSIS — I10 Essential (primary) hypertension: Secondary | ICD-10-CM | POA: Diagnosis not present

## 2016-05-23 DIAGNOSIS — S72121D Displaced fracture of lesser trochanter of right femur, subsequent encounter for closed fracture with routine healing: Secondary | ICD-10-CM | POA: Diagnosis not present

## 2016-05-23 DIAGNOSIS — S72141D Displaced intertrochanteric fracture of right femur, subsequent encounter for closed fracture with routine healing: Secondary | ICD-10-CM | POA: Diagnosis not present

## 2016-05-24 ENCOUNTER — Encounter (HOSPITAL_COMMUNITY): Payer: Self-pay | Admitting: Adult Health

## 2016-05-24 ENCOUNTER — Encounter (HOSPITAL_COMMUNITY): Payer: Medicare HMO | Attending: Adult Health | Admitting: Adult Health

## 2016-05-24 ENCOUNTER — Encounter (HOSPITAL_COMMUNITY): Payer: Medicare HMO

## 2016-05-24 VITALS — BP 161/83 | HR 91 | Temp 98.2°F | Resp 18 | Wt 153.0 lb

## 2016-05-24 DIAGNOSIS — R634 Abnormal weight loss: Secondary | ICD-10-CM

## 2016-05-24 DIAGNOSIS — C9211 Chronic myeloid leukemia, BCR/ABL-positive, in remission: Secondary | ICD-10-CM

## 2016-05-24 DIAGNOSIS — Z79899 Other long term (current) drug therapy: Secondary | ICD-10-CM

## 2016-05-24 LAB — COMPREHENSIVE METABOLIC PANEL
ALK PHOS: 129 U/L — AB (ref 38–126)
ALT: 25 U/L (ref 14–54)
ANION GAP: 10 (ref 5–15)
AST: 23 U/L (ref 15–41)
Albumin: 4 g/dL (ref 3.5–5.0)
BILIRUBIN TOTAL: 1.3 mg/dL — AB (ref 0.3–1.2)
BUN: 17 mg/dL (ref 6–20)
CALCIUM: 9.6 mg/dL (ref 8.9–10.3)
CO2: 27 mmol/L (ref 22–32)
CREATININE: 1.24 mg/dL — AB (ref 0.44–1.00)
Chloride: 102 mmol/L (ref 101–111)
GFR, EST AFRICAN AMERICAN: 49 mL/min — AB (ref >60)
GFR, EST NON AFRICAN AMERICAN: 43 mL/min — AB (ref >60)
Glucose, Bld: 123 mg/dL — ABNORMAL HIGH (ref 65–99)
Potassium: 3.8 mmol/L (ref 3.5–5.1)
Sodium: 139 mmol/L (ref 135–145)
TOTAL PROTEIN: 7.7 g/dL (ref 6.5–8.1)

## 2016-05-24 LAB — CBC WITH DIFFERENTIAL/PLATELET
Basophils Absolute: 0 10*3/uL (ref 0.0–0.1)
Basophils Relative: 0 %
Eosinophils Absolute: 0.1 10*3/uL (ref 0.0–0.7)
Eosinophils Relative: 2 %
HCT: 36.4 % (ref 36.0–46.0)
HEMOGLOBIN: 11.3 g/dL — AB (ref 12.0–15.0)
LYMPHS ABS: 2.4 10*3/uL (ref 0.7–4.0)
LYMPHS PCT: 34 %
MCH: 26.8 pg (ref 26.0–34.0)
MCHC: 31 g/dL (ref 30.0–36.0)
MCV: 86.3 fL (ref 78.0–100.0)
MONOS PCT: 8 %
Monocytes Absolute: 0.6 10*3/uL (ref 0.1–1.0)
NEUTROS PCT: 56 %
Neutro Abs: 4 10*3/uL (ref 1.7–7.7)
Platelets: 315 10*3/uL (ref 150–400)
RBC: 4.22 MIL/uL (ref 3.87–5.11)
RDW: 14.9 % (ref 11.5–15.5)
WBC: 7.2 10*3/uL (ref 4.0–10.5)

## 2016-05-24 NOTE — Patient Instructions (Addendum)
[place] at [facility] Discharge Instructions  RECOMMENDATIONS MADE BY THE CONSULTANT AND ANY TEST RESULTS WILL BE SENT TO YOUR REFERRING PHYSICIAN.  You were seen today by Mike Craze, NP  See Amy up front for appointments  Return in 4 months with labs  You should supplement your diet with Ensure. Ensure 3 cans a day.   For constipation, please pick up some over the counter Miralax - you may purchase the generic - take once a day   Tasigna to be refilled today    Thank you for choosing [place] at [facility] to provide your oncology and hematology care.  To afford each patient quality time with our provider, please arrive at least 15 minutes before your scheduled appointment time.    If you have a lab appointment with the [facility] please come in thru the  Main Entrance and check in at the main information desk  You need to re-schedule your appointment should you arrive 10 or more minutes late.  We strive to give you quality time with our providers, and arriving late affects you and other patients whose appointments are after yours.  Also, if you no show three or more times for appointments you may be dismissed from the clinic at the providers discretion.     Again, thank you for choosing [facility].  Our hope is that these requests will decrease the amount of time that you wait before being seen by our physicians.       _____________________________________________________________  Should you have questions after your visit to [facility], please contact our office at [phone] between the hours of 8:30 a.m. and 4:30 p.m.  Voicemails left after 4:30 p.m. will not be returned until the following business day.  For prescription refill requests, have your pharmacy contact our office.       Resources For Cancer Patients and their Caregivers ? American Cancer Society: Can assist with  transportation, wigs, general needs, runs Look Good Feel Better.        940 215 7671 ? Cancer Care: Provides financial assistance, online support groups, medication/co-pay assistance.  1-800-813-HOPE (810)052-5607) ? [place] Assists [place] cancer patients and their families through emotional , educational and financial support.  (340) 806-2952 ? [place] DSS Where to apply for food stamps, Medicaid and utility assistance. (782)881-6332 ? [facility]: Transportation to medical appointments. (501)590-4341 ? Social Security Administration: May apply for disability if have a Stage IV cancer. (418)872-5096 330-250-9728 ? [employer], Disability and Transit Services: Assists with nutrition, care and transit needs. [place] Support Programs: @10RELATIVEDAYS @ > Cancer Support Group  2nd Tuesday of the month 1pm-2pm, Journey Room  > Creative Journey  3rd Tuesday of the month 1130am-1pm, Journey Room  > Look Good Feel Better  1st Wednesday of the month 10am-12 noon, Journey Room (Call [place] to register [phone])

## 2016-05-24 NOTE — Progress Notes (Signed)
Patient on Tasigna. No missed doses and not having side effects from Tasigna.

## 2016-05-25 NOTE — Progress Notes (Signed)
[place] April Page, [place] [address]   CLINIC:  Medical Oncology/Hematology  PCP:  [name], MD [place] [address] [phone]   REASON FOR VISIT:  Follow-up for CML, BCR/ABL positive, now in remission   CURRENT THERAPY: Tasigna daily    BRIEF ONCOLOGIC HISTORY:    Chronic myelogenous leukemia (CML), BCR-ABL1-positive ([place])   06/01/2014 Tumor Marker    Results for April, Page (MRN [id number]) as of 12/02/2014 09:30  06/01/2014 09:59 BCR ABL1 / ABL1 IS: 71.220 (H)       07/14/2014 Bone Marrow Biopsy    Bone Marrow, Aspirate,Biopsy, and Clot, right iliac - HYPERCELLULAR BONE MARROW WITH CHRONIC MYELOGENOUS LEUKEMIA. - SEE COMMENT. PERIPHERAL BLOOD: - CHRONIC MYELOGENOUS LEUKEMIA. Diagnosis Note Previous PCR analysis performed for bcr-abl1 is report      07/21/2014 - 08/04/2014 Chemotherapy    Tasigna 300 mg BID      08/04/2014 Miscellaneous    Ran out of 2 week sample secondary to issues with insurance approval.      08/15/2014 -  Chemotherapy    Tasigna 300 mg BID      09/17/2014 Tumor Marker    b3a2 transcript % 4.122       12/02/2014 Tumor Marker    b3a2 transcript: 0.046       03/31/2015 Tumor Marker    b3a2 transcript:  <0.001 %         03/31/2015 Remission    Complete molecular response, no detectable BCR/ABL using PCR assay        HISTORY OF PRESENT ILLNESS:  (From Dr. Donald Pore last note on 01/14/16)     INTERVAL HISTORY:  April Page 71 y.o. female returns to cancer center for follow-up. She is accompanied by her 2 daughters.    Since her last visit, she suffered a fall and broke her right hip on 04/11/16. She underwent right hip surgery. She is continuing to recover; she remains in PT with plans to complete therapy next week.    Her daughters are concerned about her appetite and weight loss.  She has lost about 13 lbs in the past 4 months.  Patient states that her appetite is "up and  down." Endorses dysgeusia.    Endorses periodic constipation; she does have BMs once daily, but stools are hard.   When asked how her spirits/mood has been, she tells me she has been okay.  Her daughters are concerned about depression. Patient denies.     REVIEW OF SYSTEMS:  Review of Systems  Constitutional: Positive for appetite change and fatigue. Negative for chills and fever.  HENT:  Negative.  Negative for lump/mass.   Eyes: Negative.   Respiratory: Negative.  Negative for cough and shortness of breath.   Cardiovascular: Negative.  Negative for chest pain and palpitations.  Gastrointestinal: Positive for constipation. Negative for abdominal pain, blood in stool, diarrhea, nausea and vomiting.  Endocrine: Negative.   Genitourinary: Negative.  Negative for dysuria, hematuria and vaginal bleeding.   Musculoskeletal:       (R) hip pain; ambulating with walker/working with PT.   Skin: Negative.  Negative for rash.  Neurological: Negative for dizziness.  Hematological: Negative.  Negative for adenopathy. Does not bruise/bleed easily.  Psychiatric/Behavioral: Negative.  Negative for depression and sleep disturbance. The patient is not nervous/anxious.      PAST MEDICAL/SURGICAL HISTORY:  Past Medical History:  Diagnosis Date  . Arthritis    stiff knees  . Blood dyscrasia   .  Cancer ([place])   . GERD (gastroesophageal reflux disease)   . H/O: CML (chronic myeloid leukemia)   . Hyperlipidemia   . Hypertension   . Obesity   . Pessary maintenance 07/01/2013   Past Surgical History:  Procedure Laterality Date  . ABDOMINAL HYSTERECTOMY    . CHOLECYSTECTOMY    . COLONOSCOPY  06/09/2002   ZDG:UYQIHKVQ hemorrhoids; otherwise normal rectum, colon   . COLONOSCOPY N/A 08/25/2013   QVZ:DGLOVFI diverticulosis. Single colonic polyp-removed  s/p segmental biopsy and stool sample. random colon bx negative. +benign leiomyoma.  . cystocele/rectocele repair  2009  . ESOPHAGOGASTRODUODENOSCOPY   06/09/2002   EPP:IRJJOA upper gastrointestinal tract s/p  54-French Maloney dilator  . FEMUR IM NAIL Right 04/12/2016   Procedure: INTRAMEDULLARY (IM) NAIL FEMORAL;  Surgeon: Nicholes Stairs, MD;  Location: [facility];  Service: Orthopedics;  Laterality: Right;  . HERNIA REPAIR    . OOPHORECTOMY       SOCIAL HISTORY:  Social History   Social History  . Marital status: Widowed    Spouse name: N/A  . Number of children: 6  . Years of education: associate   Occupational History  .  Retired   Social History Main Topics  . Smoking status: Never Smoker  . Smokeless tobacco: Never Used  . Alcohol use No  . Drug use: No  . Sexual activity: Not Currently    Birth control/ protection: Surgical     Comment: hyst   Other Topics Concern  . Not on file   Social History Narrative  . No narrative on file    FAMILY HISTORY:  Family History  Problem Relation Age of Onset  . Anuerysm Mother     deceased age 63, brain anuerysm  . Early death Mother 67  . Heart disease Father   . Hypertension Sister   . Obesity Brother   . Hypertension Sister   . Arthritis Sister   . Other Son     cardiac arrest  . Heart disease Child 53    cardiac arrest  . Colon cancer Neg Hx     CURRENT MEDICATIONS:  Outpatient Encounter Prescriptions as of 05/24/2016  Medication Sig  . lisinopril (PRINIVIL,ZESTRIL) 10 MG tablet Take 1 tablet (10 mg total) by mouth daily.  . Multiple Vitamin (MULTIVITAMIN WITH MINERALS) TABS tablet Take 1 tablet by mouth daily.  . nilotinib (TASIGNA) 150 MG capsule Take 2 capsules (300 mg total) by mouth every 12 (twelve) hours.  . [DISCONTINUED] HYDROcodone-acetaminophen (NORCO) 7.5-325 MG tablet Take 1-2 tablets by mouth every 6 (six) hours as needed for moderate pain.  . [DISCONTINUED] ondansetron (ZOFRAN ODT) 4 MG disintegrating tablet Take 1 tablet (4 mg total) by mouth every 8 (eight) hours as needed for nausea or vomiting.  . [DISCONTINUED] rivaroxaban (XARELTO) 10  MG TABS tablet Take 1 tablet (10 mg total) by mouth daily.   No facility-administered encounter medications on file as of 05/24/2016.     ALLERGIES:  No Known Allergies   PHYSICAL EXAM:  ECOG Performance status: 2 - Symptomatic (requires assistance secondary to recent right hip fracture/surgery)  Vitals:   05/24/16 1331  BP: (!) 161/83  Pulse: 91  Resp: 18  Temp: 98.2 F (36.8 C)   Filed Weights   05/24/16 1331  Weight: 153 lb (69.4 kg)    Physical Exam  Constitutional: She is oriented to person, place, and time and well-developed, well-nourished, and in no distress.  HENT:  Head: Normocephalic.  Mouth/Throat: Oropharynx is clear and moist.  No oropharyngeal exudate.  Eyes: Conjunctivae are normal. Pupils are equal, round, and reactive to light. No scleral icterus.  Neck: Normal range of motion. Neck supple.  Cardiovascular: Normal rate, regular rhythm and normal heart sounds.   Pulmonary/Chest: Effort normal and breath sounds normal. No respiratory distress.  Abdominal: Soft. Bowel sounds are normal. There is no tenderness.  Musculoskeletal: Normal range of motion. She exhibits no edema.  Lymphadenopathy:    She has no cervical adenopathy.       Right: No supraclavicular adenopathy present.       Left: No supraclavicular adenopathy present.  Neurological: She is alert and oriented to person, place, and time. No cranial nerve deficit. Gait normal.  Skin: Skin is warm and dry. No rash noted.  Psychiatric: Mood, memory and judgment normal. She has a flat affect.  Nursing note and vitals reviewed.    LABORATORY DATA:  I have reviewed the labs as listed.  CBC    Component Value Date/Time   WBC 7.2 05/24/2016 1304   RBC 4.22 05/24/2016 1304   HGB 11.3 (L) 05/24/2016 1304   HCT 36.4 05/24/2016 1304   PLT 315 05/24/2016 1304   MCV 86.3 05/24/2016 1304   MCH 26.8 05/24/2016 1304   MCHC 31.0 05/24/2016 1304   RDW 14.9 05/24/2016 1304   LYMPHSABS 2.4 05/24/2016 1304     MONOABS 0.6 05/24/2016 1304   EOSABS 0.1 05/24/2016 1304   BASOSABS 0.0 05/24/2016 1304   CMP Latest Ref Rng & Units 05/24/2016 04/15/2016 04/14/2016  Glucose 65 - 99 mg/dL 123(H) 108(H) 115(H)  BUN 6 - 20 mg/dL _0 Creatinine 0.44 - 1.00 mg/dL 1.24(H) 0.82 0.96  Sodium 135 - 145 mmol/L 139 136 137  Potassium 3.5 - 5.1 mmol/L 3.8 3.8 3.9  Chloride 101 - 111 mmol/L 102 106 106  CO2 22 - 32 mmol/L _1 Calcium 8.9 - 10.3 mg/dL 9.6 9.0 8.8(L)  Total Protein 6.5 - 8.1 g/dL 7.7 5.8(L) 5.8(L)  Total Bilirubin 0.3 - 1.2 mg/dL 1.3(H) 1.0 1.2  Alkaline Phos 38 - 126 U/L 129(H) 80 68  AST 15 - 41 U/L _2 ALT 14 - 54 U/L 25 15 13(L)   Results for ANANYA, MCCLEESE (MRN [id number])   Ref. Range 06/01/2014 09:59  BCR ABL1 / ABL1 IS Latest Units: % 71.220 (H)      PENDING LABS:    DIAGNOSTIC IMAGING:    PATHOLOGY:  Bone marrow biopsy: 07/14/14    ASSESSMENT & PLAN:   CML, BCR/ALB positive, in remission:  -Noted complete molecular response with BCR/ABL negative.  BCR/ABL results pending for today.  -Reports doing well on Tasigna.  She is taking medication as scheduled.  -Last EKG on 04/12/16 showed normal QTc (Tasigna can cause prolonged QTc).  -Refill of Tasigna printed and given to Durene Cal, patient advocate.   Mild hyperbilirubinemia:  -Bilirubin resulted after patient left clinic today. Bilirubin mildly elevated at 1.3 today. Sclerae non-icteric.  -Tasigna carries possible side effect of hyperbilirubinemia. We will bring her back in 3-4 weeks to recheck CMET.   Constipation:  -Recommended she start OTC Miralax daily and increase water consumption.   Weight loss:  -She continues to lose weight. Dysgeusia and decreased appetite are biggest contributors.  -I will place referral to nutrition for continued support.  I recommended Ms. Grulke supplement her oral intake with Ensure/Boost at least 3 cans/day. Encouraged her to increase caloric and protein intake. She  understands that  adequate nutrition will help her heal quicker from her recent hip surgery and improve her nutritional status with CML.    Dispo:  -Return to cancer center in 4 months with repeat labs.    All questions were answered to patient's stated satisfaction. Encouraged patient to call with any new concerns or questions before her next visit to the cancer center and we can certain see her sooner, if needed.      Orders placed this encounter:  Orders Placed This Encounter  Procedures  . BCR-ABL1, CML/ALL, PCR, QUANT  . CBC with Differential/Platelet  . Comprehensive metabolic panel  . Amb Referral to Nutrition and Diabetic E      Mike Craze, NP [place] [phone]

## 2016-05-27 ENCOUNTER — Encounter: Payer: Self-pay | Admitting: Internal Medicine

## 2016-05-27 DIAGNOSIS — S72141D Displaced intertrochanteric fracture of right femur, subsequent encounter for closed fracture with routine healing: Secondary | ICD-10-CM | POA: Diagnosis not present

## 2016-05-28 DIAGNOSIS — S72121D Displaced fracture of lesser trochanter of right femur, subsequent encounter for closed fracture with routine healing: Secondary | ICD-10-CM | POA: Diagnosis not present

## 2016-05-28 DIAGNOSIS — I10 Essential (primary) hypertension: Secondary | ICD-10-CM | POA: Diagnosis not present

## 2016-05-28 DIAGNOSIS — E1165 Type 2 diabetes mellitus with hyperglycemia: Secondary | ICD-10-CM | POA: Diagnosis not present

## 2016-05-28 DIAGNOSIS — C9210 Chronic myeloid leukemia, BCR/ABL-positive, not having achieved remission: Secondary | ICD-10-CM | POA: Diagnosis not present

## 2016-05-28 DIAGNOSIS — L89322 Pressure ulcer of left buttock, stage 2: Secondary | ICD-10-CM | POA: Diagnosis not present

## 2016-05-28 DIAGNOSIS — S72141D Displaced intertrochanteric fracture of right femur, subsequent encounter for closed fracture with routine healing: Secondary | ICD-10-CM | POA: Diagnosis not present

## 2016-05-28 DIAGNOSIS — E669 Obesity, unspecified: Secondary | ICD-10-CM | POA: Diagnosis not present

## 2016-05-28 DIAGNOSIS — W19XXXD Unspecified fall, subsequent encounter: Secondary | ICD-10-CM | POA: Diagnosis not present

## 2016-05-28 DIAGNOSIS — L89312 Pressure ulcer of right buttock, stage 2: Secondary | ICD-10-CM | POA: Diagnosis not present

## 2016-05-29 DIAGNOSIS — I10 Essential (primary) hypertension: Secondary | ICD-10-CM | POA: Diagnosis not present

## 2016-05-29 DIAGNOSIS — L89312 Pressure ulcer of right buttock, stage 2: Secondary | ICD-10-CM | POA: Diagnosis not present

## 2016-05-29 DIAGNOSIS — W19XXXD Unspecified fall, subsequent encounter: Secondary | ICD-10-CM | POA: Diagnosis not present

## 2016-05-29 DIAGNOSIS — E1165 Type 2 diabetes mellitus with hyperglycemia: Secondary | ICD-10-CM | POA: Diagnosis not present

## 2016-05-29 DIAGNOSIS — E669 Obesity, unspecified: Secondary | ICD-10-CM | POA: Diagnosis not present

## 2016-05-29 DIAGNOSIS — L89322 Pressure ulcer of left buttock, stage 2: Secondary | ICD-10-CM | POA: Diagnosis not present

## 2016-05-29 DIAGNOSIS — S72121D Displaced fracture of lesser trochanter of right femur, subsequent encounter for closed fracture with routine healing: Secondary | ICD-10-CM | POA: Diagnosis not present

## 2016-05-29 DIAGNOSIS — S72141D Displaced intertrochanteric fracture of right femur, subsequent encounter for closed fracture with routine healing: Secondary | ICD-10-CM | POA: Diagnosis not present

## 2016-05-29 DIAGNOSIS — C9210 Chronic myeloid leukemia, BCR/ABL-positive, not having achieved remission: Secondary | ICD-10-CM | POA: Diagnosis not present

## 2016-05-30 ENCOUNTER — Other Ambulatory Visit (HOSPITAL_COMMUNITY): Payer: Self-pay | Admitting: Adult Health

## 2016-05-30 DIAGNOSIS — I10 Essential (primary) hypertension: Secondary | ICD-10-CM | POA: Diagnosis not present

## 2016-05-30 DIAGNOSIS — L89312 Pressure ulcer of right buttock, stage 2: Secondary | ICD-10-CM | POA: Diagnosis not present

## 2016-05-30 DIAGNOSIS — S72121D Displaced fracture of lesser trochanter of right femur, subsequent encounter for closed fracture with routine healing: Secondary | ICD-10-CM | POA: Diagnosis not present

## 2016-05-30 DIAGNOSIS — E669 Obesity, unspecified: Secondary | ICD-10-CM | POA: Diagnosis not present

## 2016-05-30 DIAGNOSIS — S72141D Displaced intertrochanteric fracture of right femur, subsequent encounter for closed fracture with routine healing: Secondary | ICD-10-CM | POA: Diagnosis not present

## 2016-05-30 DIAGNOSIS — C9210 Chronic myeloid leukemia, BCR/ABL-positive, not having achieved remission: Secondary | ICD-10-CM | POA: Diagnosis not present

## 2016-05-30 DIAGNOSIS — C9211 Chronic myeloid leukemia, BCR/ABL-positive, in remission: Secondary | ICD-10-CM

## 2016-05-30 DIAGNOSIS — W19XXXD Unspecified fall, subsequent encounter: Secondary | ICD-10-CM | POA: Diagnosis not present

## 2016-05-30 DIAGNOSIS — L89322 Pressure ulcer of left buttock, stage 2: Secondary | ICD-10-CM | POA: Diagnosis not present

## 2016-05-30 DIAGNOSIS — E1165 Type 2 diabetes mellitus with hyperglycemia: Secondary | ICD-10-CM | POA: Diagnosis not present

## 2016-05-30 LAB — BCR-ABL1, CML/ALL, PCR, QUANT

## 2016-06-03 ENCOUNTER — Inpatient Hospital Stay (HOSPITAL_COMMUNITY)
Admission: EM | Admit: 2016-06-03 | Discharge: 2016-06-05 | DRG: 065 | Disposition: A | Discharge status: Home Health | Payer: Medicare HMO | Attending: Internal Medicine | Admitting: Internal Medicine

## 2016-06-03 ENCOUNTER — Encounter (HOSPITAL_COMMUNITY): Payer: Self-pay | Admitting: Emergency Medicine

## 2016-06-03 ENCOUNTER — Emergency Department (HOSPITAL_COMMUNITY): Payer: Medicare HMO

## 2016-06-03 ENCOUNTER — Other Ambulatory Visit: Payer: Self-pay

## 2016-06-03 DIAGNOSIS — I6302 Cerebral infarction due to thrombosis of basilar artery: Secondary | ICD-10-CM | POA: Diagnosis not present

## 2016-06-03 DIAGNOSIS — R4781 Slurred speech: Secondary | ICD-10-CM

## 2016-06-03 DIAGNOSIS — C9210 Chronic myeloid leukemia, BCR/ABL-positive, not having achieved remission: Secondary | ICD-10-CM | POA: Diagnosis not present

## 2016-06-03 DIAGNOSIS — Z79899 Other long term (current) drug therapy: Secondary | ICD-10-CM

## 2016-06-03 DIAGNOSIS — R471 Dysarthria and anarthria: Secondary | ICD-10-CM | POA: Diagnosis not present

## 2016-06-03 DIAGNOSIS — R270 Ataxia, unspecified: Secondary | ICD-10-CM | POA: Diagnosis not present

## 2016-06-03 DIAGNOSIS — Z96641 Presence of right artificial hip joint: Secondary | ICD-10-CM | POA: Diagnosis present

## 2016-06-03 DIAGNOSIS — I69320 Aphasia following cerebral infarction: Secondary | ICD-10-CM

## 2016-06-03 DIAGNOSIS — E785 Hyperlipidemia, unspecified: Secondary | ICD-10-CM | POA: Diagnosis not present

## 2016-06-03 DIAGNOSIS — D689 Coagulation defect, unspecified: Secondary | ICD-10-CM | POA: Diagnosis not present

## 2016-06-03 DIAGNOSIS — I6523 Occlusion and stenosis of bilateral carotid arteries: Secondary | ICD-10-CM | POA: Diagnosis not present

## 2016-06-03 DIAGNOSIS — I6350 Cerebral infarction due to unspecified occlusion or stenosis of unspecified cerebral artery: Secondary | ICD-10-CM | POA: Diagnosis not present

## 2016-06-03 DIAGNOSIS — Z8249 Family history of ischemic heart disease and other diseases of the circulatory system: Secondary | ICD-10-CM | POA: Diagnosis not present

## 2016-06-03 DIAGNOSIS — Z7982 Long term (current) use of aspirin: Secondary | ICD-10-CM | POA: Diagnosis not present

## 2016-06-03 DIAGNOSIS — K219 Gastro-esophageal reflux disease without esophagitis: Secondary | ICD-10-CM | POA: Diagnosis present

## 2016-06-03 DIAGNOSIS — I6601 Occlusion and stenosis of right middle cerebral artery: Secondary | ICD-10-CM | POA: Diagnosis not present

## 2016-06-03 DIAGNOSIS — I10 Essential (primary) hypertension: Secondary | ICD-10-CM | POA: Diagnosis not present

## 2016-06-03 DIAGNOSIS — R531 Weakness: Secondary | ICD-10-CM | POA: Diagnosis not present

## 2016-06-03 DIAGNOSIS — I639 Cerebral infarction, unspecified: Secondary | ICD-10-CM | POA: Diagnosis not present

## 2016-06-03 DIAGNOSIS — K5900 Constipation, unspecified: Secondary | ICD-10-CM | POA: Diagnosis present

## 2016-06-03 DIAGNOSIS — R7302 Impaired glucose tolerance (oral): Secondary | ICD-10-CM | POA: Diagnosis not present

## 2016-06-03 DIAGNOSIS — Z90710 Acquired absence of both cervix and uterus: Secondary | ICD-10-CM | POA: Diagnosis not present

## 2016-06-03 DIAGNOSIS — R4789 Other speech disturbances: Secondary | ICD-10-CM | POA: Diagnosis not present

## 2016-06-03 DIAGNOSIS — I6602 Occlusion and stenosis of left middle cerebral artery: Secondary | ICD-10-CM | POA: Diagnosis not present

## 2016-06-03 LAB — URINALYSIS, ROUTINE W REFLEX MICROSCOPIC
Bilirubin Urine: NEGATIVE
GLUCOSE, UA: NEGATIVE mg/dL
HGB URINE DIPSTICK: NEGATIVE
Ketones, ur: NEGATIVE mg/dL
NITRITE: NEGATIVE
PH: 7 (ref 5.0–8.0)
Protein, ur: NEGATIVE mg/dL
Specific Gravity, Urine: 1.01 (ref 1.005–1.030)

## 2016-06-03 LAB — COMPREHENSIVE METABOLIC PANEL
ALBUMIN: 4 g/dL (ref 3.5–5.0)
ALT: 26 U/L (ref 14–54)
AST: 32 U/L (ref 15–41)
Alkaline Phosphatase: 147 U/L — ABNORMAL HIGH (ref 38–126)
Anion gap: 9 (ref 5–15)
BILIRUBIN TOTAL: 1 mg/dL (ref 0.3–1.2)
BUN: 16 mg/dL (ref 6–20)
CO2: 28 mmol/L (ref 22–32)
CREATININE: 1.35 mg/dL — AB (ref 0.44–1.00)
Calcium: 9.8 mg/dL (ref 8.9–10.3)
Chloride: 102 mmol/L (ref 101–111)
GFR calc Af Amer: 45 mL/min — ABNORMAL LOW (ref >60)
GFR, EST NON AFRICAN AMERICAN: 38 mL/min — AB (ref >60)
Glucose, Bld: 147 mg/dL — ABNORMAL HIGH (ref 65–99)
POTASSIUM: 4.2 mmol/L (ref 3.5–5.1)
Sodium: 139 mmol/L (ref 135–145)
TOTAL PROTEIN: 7.8 g/dL (ref 6.5–8.1)

## 2016-06-03 LAB — CBC
HEMATOCRIT: 39.4 % (ref 36.0–46.0)
HEMOGLOBIN: 12.4 g/dL (ref 12.0–15.0)
MCH: 27.1 pg (ref 26.0–34.0)
MCHC: 31.5 g/dL (ref 30.0–36.0)
MCV: 86.2 fL (ref 78.0–100.0)
Platelets: 315 10*3/uL (ref 150–400)
RBC: 4.57 MIL/uL (ref 3.87–5.11)
RDW: 14.8 % (ref 11.5–15.5)
WBC: 7.4 10*3/uL (ref 4.0–10.5)

## 2016-06-03 LAB — DIFFERENTIAL
BASOS ABS: 0 10*3/uL (ref 0.0–0.1)
Basophils Relative: 0 %
Eosinophils Absolute: 0.1 10*3/uL (ref 0.0–0.7)
Eosinophils Relative: 1 %
LYMPHS ABS: 2.2 10*3/uL (ref 0.7–4.0)
LYMPHS PCT: 29 %
MONOS PCT: 8 %
Monocytes Absolute: 0.6 10*3/uL (ref 0.1–1.0)
NEUTROS ABS: 4.5 10*3/uL (ref 1.7–7.7)
NEUTROS PCT: 61 %

## 2016-06-03 LAB — RAPID URINE DRUG SCREEN, HOSP PERFORMED
AMPHETAMINES: NOT DETECTED
Barbiturates: NOT DETECTED
Benzodiazepines: NOT DETECTED
Cocaine: NOT DETECTED
OPIATES: NOT DETECTED
TETRAHYDROCANNABINOL: NOT DETECTED

## 2016-06-03 LAB — TROPONIN I: Troponin I: <0.03 ng/mL (ref <0.03)

## 2016-06-03 LAB — ETHANOL: Alcohol, Ethyl (B): <5 mg/dL (ref <5)

## 2016-06-03 LAB — APTT: APTT: 27 s (ref 24–36)

## 2016-06-03 LAB — PROTIME-INR
INR: 0.99
Prothrombin Time: 13.1 seconds (ref 11.4–15.2)

## 2016-06-03 MED ORDER — NILOTINIB HCL 150 MG PO CAPS
300.0000 mg | ORAL_CAPSULE | Freq: Two times a day (BID) | ORAL | Status: DC
  Administered 2016-06-04 – 2016-06-05 (×3): 300 mg via ORAL
  Filled 2016-06-03: qty 2

## 2016-06-03 MED ORDER — ASPIRIN 325 MG PO TABS
325.0000 mg | ORAL_TABLET | Freq: Every day | ORAL | Status: DC
  Administered 2016-06-03 – 2016-06-05 (×3): 325 mg via ORAL
  Filled 2016-06-03 (×3): qty 1

## 2016-06-03 MED ORDER — SENNOSIDES-DOCUSATE SODIUM 8.6-50 MG PO TABS: 1.0000 | ORAL_TABLET | Freq: Every evening | ORAL | Status: DC | PRN

## 2016-06-03 MED ORDER — ACETAMINOPHEN 325 MG PO TABS: 650.0000 mg | ORAL_TABLET | ORAL | Status: DC | PRN

## 2016-06-03 MED ORDER — ADULT MULTIVITAMIN W/MINERALS CH
1.0000 | ORAL_TABLET | Freq: Every day | ORAL | Status: DC
  Administered 2016-06-03 – 2016-06-05 (×3): 1 via ORAL
  Filled 2016-06-03 (×3): qty 1

## 2016-06-03 MED ORDER — ENOXAPARIN SODIUM 40 MG/0.4ML ~~LOC~~ SOLN
40.0000 mg | SUBCUTANEOUS | Status: DC
  Administered 2016-06-03 – 2016-06-04 (×2): 40 mg via SUBCUTANEOUS
  Filled 2016-06-03 (×2): qty 0.4

## 2016-06-03 MED ORDER — SODIUM CHLORIDE 0.9 % IV SOLN
INTRAVENOUS | Status: DC
  Administered 2016-06-03 – 2016-06-05 (×3): via INTRAVENOUS

## 2016-06-03 MED ORDER — ACETAMINOPHEN 160 MG/5ML PO SOLN: 650.0000 mg | ORAL | Status: DC | PRN

## 2016-06-03 MED ORDER — STROKE: EARLY STAGES OF RECOVERY BOOK
Freq: Once | Status: AC
  Administered 2016-06-04: 10:00:00
  Filled 2016-06-03: qty 1

## 2016-06-03 MED ORDER — ASPIRIN 300 MG RE SUPP: 300.0000 mg | Freq: Every day | RECTAL | Status: DC

## 2016-06-03 MED ORDER — ACETAMINOPHEN 650 MG RE SUPP: 650.0000 mg | RECTAL | Status: DC | PRN

## 2016-06-03 MED ORDER — ENSURE ENLIVE PO LIQD
237.0000 mL | Freq: Two times a day (BID) | ORAL | Status: DC
  Administered 2016-06-03: 237 mL via ORAL

## 2016-06-03 NOTE — ED Provider Notes (Signed)
[facility] DEPT Provider Note   CSN: [id number] Arrival date & time: 06/03/16  4854     History   Chief Complaint Chief Complaint  Patient presents with  . Aphasia    HPI April Page is a 71 y.o. female.  HPI  Pt was seen at 0830. Per pt and her family, c/o unknown onset and persistence of constant "slurred speech" that was noticed yesterday morning at 0700. Pt and family also c/o RLE "weakness" for "a while" but had felt this was due to s/p right THR 2 months ago. Denies CP/palpitations, no SOB/cough, no abd pain, no N/V/D, no facial droop, no dysphagia, no tingling/numbness in extremities.   Past Medical History:  Diagnosis Date  . Arthritis    stiff knees  . Blood dyscrasia   . Cancer ([place])   . GERD (gastroesophageal reflux disease)   . H/O: CML (chronic myeloid leukemia)   . Hyperlipidemia   . Hypertension   . Obesity   . Pessary maintenance 07/01/2013    Patient Active Problem List   Diagnosis Date Noted  . Lightheadedness 04/13/2016  . Hypokalemia 04/13/2016  . Hyperglycemia 04/13/2016  . Essential hypertension 04/13/2016  . Hyperlipidemia 04/13/2016  . S/P right hip fracture 04/12/2016  . Hip fracture ([place]) 04/12/2016  . Fall   . Itching in the vaginal area 10/05/2014  . Chronic myelogenous leukemia (CML), BCR-ABL1-positive ([place])   . GERD (gastroesophageal reflux disease) 12/06/2013  . Leukocytosis 08/06/2013  . Diabetes type 2, uncontrolled ([place]) 08/06/2013  . Atypical chest pain 08/06/2013  . Chronic diarrhea 08/05/2013  . Pessary maintenance 07/01/2013    Past Surgical History:  Procedure Laterality Date  . ABDOMINAL HYSTERECTOMY    . CHOLECYSTECTOMY    . COLONOSCOPY  06/09/2002   OEV:[id number] hemorrhoids; otherwise normal rectum, colon   . COLONOSCOPY N/A 08/25/2013   HWE:XHBZJIR diverticulosis. Single colonic polyp-removed  s/p segmental biopsy and stool sample. random colon bx negative. +benign leiomyoma.  . cystocele/rectocele repair   2009  . ESOPHAGOGASTRODUODENOSCOPY  06/09/2002   CVE:LFYBOF upper gastrointestinal tract s/p  54-French Maloney dilator  . FEMUR IM NAIL Right 04/12/2016   Procedure: INTRAMEDULLARY (IM) NAIL FEMORAL;  Surgeon: Nicholes Stairs, MD;  Location: [facility];  Service: Orthopedics;  Laterality: Right;  . HERNIA REPAIR    . OOPHORECTOMY      OB History    Gravida Para Term Preterm AB Living   6 6       5    SAB TAB Ectopic Multiple Live Births                   Home Medications    Prior to Admission medications   Medication Sig Start Date End Date Taking? Authorizing Provider  lisinopril (PRINIVIL,ZESTRIL) 10 MG tablet Take 1 tablet (10 mg total) by mouth daily. 01/17/15   Baird Cancer, PA-C  Multiple Vitamin (MULTIVITAMIN WITH MINERALS) TABS tablet Take 1 tablet by mouth daily.    Historical Provider, MD  nilotinib (TASIGNA) 150 MG capsule Take 2 capsules (300 mg total) by mouth every 12 (twelve) hours. 09/05/15   Baird Cancer, PA-C    Family History Family History  Problem Relation Age of Onset  . Anuerysm Mother     deceased age 76, brain anuerysm  . Early death Mother 66  . Heart disease Father   . Hypertension Sister   . Obesity Brother   . Hypertension Sister   . Arthritis Sister   . Other Son  cardiac arrest  . Heart disease Child 19    cardiac arrest  . Colon cancer Neg Hx     Social History Social History  Substance Use Topics  . Smoking status: Never Smoker  . Smokeless tobacco: Never Used  . Alcohol use No     Allergies   Patient has no known allergies.   Review of Systems Review of Systems ROS: Statement: All systems negative except as marked or noted in the HPI; Constitutional: Negative for fever and chills. ; ; Eyes: Negative for eye pain, redness and discharge. ; ; ENMT: Negative for ear pain, hoarseness, nasal congestion, sinus pressure and sore throat. ; ; Cardiovascular: Negative for chest pain, palpitations, diaphoresis, dyspnea and  peripheral edema. ; ; Respiratory: Negative for cough, wheezing and stridor. ; ; Gastrointestinal: Negative for nausea, vomiting, diarrhea, abdominal pain, blood in stool, hematemesis, jaundice and rectal bleeding. . ; ; Genitourinary: Negative for dysuria, flank pain and hematuria. ; ; Musculoskeletal: Negative for back pain and neck pain. Negative for swelling and trauma.; ; Skin: Negative for pruritus, rash, abrasions, blisters, bruising and skin lesion.; ; Neuro: +slurred speech, right leg weakness. Negative for headache, lightheadedness and neck stiffness. Negative for weakness, altered level of consciousness, altered mental status, paresthesias, involuntary movement, seizure and syncope.       Physical Exam Updated Vital Signs BP (!) 154/86 (BP Location: Right Arm)   Pulse 77   Temp 98.2 F (36.8 C)   Resp 18   Ht 5\' 6"  (1.676 m)   Wt 153 lb (69.4 kg)   SpO2 100%   BMI 24.69 kg/m   Physical Exam 0835: Physical examination:  Nursing notes reviewed; Vital signs and O2 SAT reviewed;  Constitutional: Well developed, Well nourished, Well hydrated, In no acute distress; Head:  Normocephalic, atraumatic; Eyes: EOMI, PERRL, No scleral icterus; ENMT: Mouth and pharynx normal, Mucous membranes moist; Neck: Supple, Full range of motion, No lymphadenopathy; Cardiovascular: Regular rate and rhythm, No gallop; Respiratory: Breath sounds clear & equal bilaterally, No wheezes. Normal respiratory effort/excursion; Chest: Nontender, Movement normal; Abdomen: Soft, Nontender, Nondistended, Normal bowel sounds; Genitourinary: No CVA tenderness; Extremities: Pulses normal, No tenderness, No edema, No calf edema or asymmetry.; Neuro: AA&Ox3, Major CN grossly intact. Speech minimal, clear.  No facial droop. Right < left grip. Strength 4+/5 RUE and RLE; 5/5 LUE and LLE.  No gross sensory deficits.  Normal cerebellar testing bilat UE's (finger-nose) and LE's (heel-shin)..; Skin: Color normal, Warm, Dry.   ED  Treatments / Results  Labs (all labs ordered are listed, but only abnormal results are displayed)   EKG  EKG Interpretation  Date/Time:  Monday June 03 2016 08:36:03 EDT Ventricular Rate:  68 PR Interval:    QRS Duration: 85 QT Interval:  414 QTC Calculation: 441 R Axis:   -8 Text Interpretation:  Sinus rhythm Abnormal R-wave progression, early transition Baseline wander When compared with ECG of 04/11/2016 No significant change was found Confirmed by [facility]  MD, Nunzio Cory [phone]) on 06/03/2016 8:43:05 AM       Radiology   Procedures Procedures (including critical care time)  Medications Ordered in ED Medications - No data to display   Initial Impression / Assessment and Plan / ED Course  I have reviewed the triage vital signs and the nursing notes.  Pertinent labs & imaging results that were available during my care of the patient were reviewed by me and considered in my medical decision making (see chart for details).  MDM Reviewed: previous  chart, nursing note and vitals Reviewed previous: labs and ECG Interpretation: labs, ECG, CT scan and MRI   Results for orders placed or performed during the hospital encounter of 06/03/16  Ethanol  Result Value Ref Range   Alcohol, Ethyl (B) <5 <5 mg/dL  Protime-INR  Result Value Ref Range   Prothrombin Time 13.1 11.4 - 15.2 seconds   INR 0.99   APTT  Result Value Ref Range   aPTT 27 24 - 36 seconds  CBC  Result Value Ref Range   WBC 7.4 4.0 - 10.5 K/uL   RBC 4.57 3.87 - 5.11 MIL/uL   Hemoglobin 12.4 12.0 - 15.0 g/dL   HCT 39.4 36.0 - 46.0 %   MCV 86.2 78.0 - 100.0 fL   MCH 27.1 26.0 - 34.0 pg   MCHC 31.5 30.0 - 36.0 g/dL   RDW 14.8 11.5 - 15.5 %   Platelets 315 150 - 400 K/uL  Differential  Result Value Ref Range   Neutrophils Relative % 61 %   Neutro Abs 4.5 1.7 - 7.7 K/uL   Lymphocytes Relative 29 %   Lymphs Abs 2.2 0.7 - 4.0 K/uL   Monocytes Relative 8 %   Monocytes Absolute 0.6 0.1 - 1.0 K/uL    Eosinophils Relative 1 %   Eosinophils Absolute 0.1 0.0 - 0.7 K/uL   Basophils Relative 0 %   Basophils Absolute 0.0 0.0 - 0.1 K/uL  Comprehensive metabolic panel  Result Value Ref Range   Sodium 139 135 - 145 mmol/L   Potassium 4.2 3.5 - 5.1 mmol/L   Chloride 102 101 - 111 mmol/L   CO2 28 22 - 32 mmol/L   Glucose, Bld 147 (H) 65 - 99 mg/dL   BUN 16 6 - 20 mg/dL   Creatinine, Ser 1.35 (H) 0.44 - 1.00 mg/dL   Calcium 9.8 8.9 - 10.3 mg/dL   Total Protein 7.8 6.5 - 8.1 g/dL   Albumin 4.0 3.5 - 5.0 g/dL   AST 32 15 - 41 U/L   ALT 26 14 - 54 U/L   Alkaline Phosphatase 147 (H) 38 - 126 U/L   Total Bilirubin 1.0 0.3 - 1.2 mg/dL   GFR calc non Af Amer 38 (L) >60 mL/min   GFR calc Af Amer 45 (L) >60 mL/min   Anion gap 9 5 - 15  Urine rapid drug screen (hosp performed)not at [facility]  Result Value Ref Range   Opiates NONE DETECTED NONE DETECTED   Cocaine NONE DETECTED NONE DETECTED   Benzodiazepines NONE DETECTED NONE DETECTED   Amphetamines NONE DETECTED NONE DETECTED   Tetrahydrocannabinol NONE DETECTED NONE DETECTED   Barbiturates NONE DETECTED NONE DETECTED  Urinalysis, Routine w reflex microscopic  Result Value Ref Range   Color, Urine YELLOW YELLOW   APPearance HAZY (A) CLEAR   Specific Gravity, Urine 1.010 1.005 - 1.030   pH 7.0 5.0 - 8.0   Glucose, UA NEGATIVE NEGATIVE mg/dL   Hgb urine dipstick NEGATIVE NEGATIVE   Bilirubin Urine NEGATIVE NEGATIVE   Ketones, ur NEGATIVE NEGATIVE mg/dL   Protein, ur NEGATIVE NEGATIVE mg/dL   Nitrite NEGATIVE NEGATIVE   Leukocytes, UA LARGE (A) NEGATIVE   RBC / HPF 0-5 0 - 5 RBC/hpf   WBC, UA 6-30 0 - 5 WBC/hpf   Bacteria, UA RARE (A) NONE SEEN   Squamous Epithelial / LPF 0-5 (A) NONE SEEN   Mucous PRESENT    Hyaline Casts, UA PRESENT   Troponin I  Result Value Ref  Range   Troponin I <0.03 <0.03 ng/mL   Ct Head Wo Contrast Result Date: 06/03/2016 CLINICAL DATA:  Slurred speech.  Weakness EXAM: CT HEAD WITHOUT CONTRAST TECHNIQUE:  Contiguous axial images were obtained from the base of the skull through the vertex without intravenous contrast. COMPARISON:  None. FINDINGS: Brain: Cerebral volume normal for age. Negative for hydrocephalus. Mild white matter ischemic changes appear chronic. Chronic infarct right internal capsule anteriorly Negative for acute infarct.  Negative for acute hemorrhage or mass. Vascular: No hyperdense vessel or unexpected calcification. Skull: No acute skeletal abnormality. 15 mm osteoma left parietal bone projecting into the scalp. This has a benign appearance. Sinuses/Orbits: Negative Other: None IMPRESSION: No acute intracranial abnormality. Chronic ischemic changes as above. Electronically Signed   By: Franchot Gallo M.D.   On: 06/03/2016 08:58   Mr Jodene Nam Head Wo Contrast Result Date: 06/03/2016 CLINICAL DATA:  New onset of slurred speech with right upper and lower extremity weakness beginning yesterday morning. EXAM: MRA HEAD WITHOUT CONTRAST TECHNIQUE: Angiographic images of the Circle of Willis were obtained using MRA technique without intravenous contrast. COMPARISON:  MRI brain from the same day. FINDINGS: There is mild tortuosity of the cervical internal carotid arteries bilaterally. Mild narrowing of less than 50% is present within the precavernous right internal carotid artery. The right A1 segment is aplastic. The left A1 segment is dominant and normal. Both A2 segments fill. There is moderate stenosis in the mid left M2 segment. There is moderate to high-grade stenosis of the distal right M1 segment. There is significant attenuation of M2 branch vessels bilaterally. Proximal stenosis is noted in the left M2 branches. The left vertebral artery is the dominant vessel. The right PICA origin is visualized. The left PICA is not visualized. The basilar artery is normal. Both posterior cerebral arteries originate from basilar tip. There is a high-grade stenosis in the proximal left posterior cerebral artery  which may account for perforator vessels to the area of acute infarction. Moderate stenosis is present in the right P2 segment. IMPRESSION: 1. High-grade stenosis or occlusion of the left posterior cerebral artery proximally may account for the left corpus callosum infarct. 2. Moderate narrowing of the proximal right posterior cerebral artery. 3. Moderate to high-grade stenosis of the distal right M1 segment, just before the bifurcation. 4. Moderate stenoses of proximal left P2 segments at the left MCA bifurcation. 5. Moderate attenuation of MCA branch vessels, right worse than left. 6. Mild narrowing of the cavernous right internal carotid artery. Electronically Signed   By: San Morelle M.D.   On: 06/03/2016 11:04   Mr Brain Wo Contrast (neuro Protocol) Result Date: 06/03/2016 CLINICAL DATA:  Slurred speech beginning yesterday morning. Right upper and lower extremity weakness. EXAM: MRI HEAD WITHOUT CONTRAST TECHNIQUE: Multiplanar, multiecho pulse sequences of the brain and surrounding structures were obtained without intravenous contrast. COMPARISON:  CT head without contrast from the same day. FINDINGS: Brain: A 7 mm focus of restricted diffusion is present within the splenium of the corpus callosum on the left. No other acute infarct is present. A remote infarct is present in the anterior right lentiform nucleus and internal capsule. There is mild ex vacuo dilation of the adjacent ventricle. Remote lacunar infarcts are present within the thalami bilaterally. White matter changes extend into the brainstem. The cerebellum is unremarkable. Vascular: Flow is present in the major intracranial arteries. Skull and upper cervical spine: The skullbase is within normal limits. All midline sagittal structures are unremarkable. Craniocervical junction is within  normal limits. Sinuses/Orbits: The paranasal sinuses and mastoid air cells are clear. The globes and orbits are within normal limits. IMPRESSION: 1. 7 mm  acute/subacute nonhemorrhagic infarct involving the splenium of the corpus callosum on the left. 2. Remote right basal ganglia infarct. 3. Remote infarcts of the thalami. 4. Moderate age advanced white matter disease bilaterally extends into the brainstem. Electronically Signed   By: San Morelle M.D.   On: 06/03/2016 10:59    0835:  Pt not code stroke due to delay in presentation.   1130:  MRI with acute/subacute stroke. No clear UTI on Udip; UC pending. Dx and testing d/w pt and family.  Questions answered.  Verb understanding, agreeable to admit.  T/C to [facility] Dr. Jerilee Hoh, case discussed, including:  HPI, pertinent PM/SHx, VS/PE, dx testing, ED course and treatment:  Agreeable to admit.    Final Clinical Impressions(s) / ED Diagnoses   Final diagnoses:  None    New Prescriptions New Prescriptions   No medications on file     Francine Graven, DO 06/07/16 1653

## 2016-06-03 NOTE — ED Notes (Signed)
SSS done at [phone]

## 2016-06-03 NOTE — ED Notes (Signed)
Pt taken to MRI  

## 2016-06-03 NOTE — ED Notes (Signed)
EDP in room performing NIH

## 2016-06-03 NOTE — ED Notes (Signed)
Pt just returned from ct. No changes.

## 2016-06-03 NOTE — ED Triage Notes (Signed)
Family states via the telephone they noticed the pt had slurred speech yesterday morning at 0700. On arrival to ED right arm and right leg noticed to be weaker as well.

## 2016-06-03 NOTE — ED Notes (Signed)
Pt takne to ct

## 2016-06-03 NOTE — H&P (Signed)
History and Physical    April Page OTL:[id number] DOB: 07/19/1945 DOA: 06/03/2016  Referring MD/NP/PA: Francine Graven, EDP PCP: [name], MD  Patient coming from: Home  Chief Complaint: slurred speech  HPI: April Page is a 71 y.o. female with h/o HTN, MO, HLD here with slurred speech that the daughter noticed yesterday. For a few weeks she has also been "weak" and has been "moving more slowly". Labs WNL, CT head without acute disease, MRI brain IMPRESSION: 1. 7 mm acute/subacute nonhemorrhagic infarct involving the splenium of the corpus callosum on the left. Admission requested for stroke work up.  Past Medical/Surgical History: Past Medical History:  Diagnosis Date  . Arthritis    stiff knees  . Blood dyscrasia   . Cancer ([place])   . GERD (gastroesophageal reflux disease)   . H/O: CML (chronic myeloid leukemia)   . Hyperlipidemia   . Hypertension   . Obesity   . Pessary maintenance 07/01/2013    Past Surgical History:  Procedure Laterality Date  . ABDOMINAL HYSTERECTOMY    . CHOLECYSTECTOMY    . COLONOSCOPY  06/09/2002   HRC:BULAGTXM hemorrhoids; otherwise normal rectum, colon   . COLONOSCOPY N/A 08/25/2013   IWO:EHOZYYQ diverticulosis. Single colonic polyp-removed  s/p segmental biopsy and stool sample. random colon bx negative. +benign leiomyoma.  . cystocele/rectocele repair  2009  . ESOPHAGOGASTRODUODENOSCOPY  06/09/2002   MGN:OIBBCW upper gastrointestinal tract s/p  54-French Maloney dilator  . FEMUR IM NAIL Right 04/12/2016   Procedure: INTRAMEDULLARY (IM) NAIL FEMORAL;  Surgeon: Nicholes Stairs, MD;  Location: [facility];  Service: Orthopedics;  Laterality: Right;  . HERNIA REPAIR    . OOPHORECTOMY      Social History:  reports that she has never smoked. She has never used smokeless tobacco. She reports that she does not drink alcohol or use drugs.  Allergies: No Known Allergies  Family History:  Family History  Problem Relation Age of  Onset  . Anuerysm Mother     deceased age 3, brain anuerysm  . Early death Mother 71  . Heart disease Father   . Hypertension Sister   . Obesity Brother   . Hypertension Sister   . Arthritis Sister   . Other Son     cardiac arrest  . Heart disease Child 61    cardiac arrest  . Colon cancer Neg Hx     Prior to Admission medications   Medication Sig Start Date End Date Taking? Authorizing Provider  Docusate Sodium (COLACE PO) Take 1 capsule by mouth at bedtime.   Yes Historical Provider, MD  losartan (COZAAR) 100 MG tablet Take 1 tablet by mouth 2 (two) times daily. 04/20/16  Yes Historical Provider, MD  Multiple Vitamin (MULTIVITAMIN WITH MINERALS) TABS tablet Take 1 tablet by mouth daily.   Yes Historical Provider, MD  nilotinib (TASIGNA) 150 MG capsule Take 2 capsules (300 mg total) by mouth every 12 (twelve) hours. 09/05/15  Yes Manon Hilding [name], PA-C  lisinopril (PRINIVIL,ZESTRIL) 10 MG tablet Take 1 tablet (10 mg total) by mouth daily. Patient not taking: Reported on 06/03/2016 01/17/15   Baird Cancer, PA-C    Review of Systems:  Constitutional: Denies fever, chills, diaphoresis, appetite change and fatigue.  HEENT: Denies photophobia, eye pain, redness, hearing loss, ear pain, congestion, sore throat, rhinorrhea, sneezing, mouth sores, trouble swallowing, neck pain, neck stiffness and tinnitus.   Respiratory: Denies SOB, DOE, cough, chest tightness,  and wheezing.   Cardiovascular: Denies chest pain, palpitations  and leg swelling.  Gastrointestinal: Denies nausea, vomiting, abdominal pain, diarrhea, constipation, blood in stool and abdominal distention.  Genitourinary: Denies dysuria, urgency, frequency, hematuria, flank pain and difficulty urinating.  Endocrine: Denies: hot or cold intolerance, sweats, changes in hair or nails, polyuria, polydipsia. Musculoskeletal: Denies myalgias, back pain, joint swelling, arthralgias and gait problem.  Skin: Denies pallor, rash and  wound.  Neurological: Denies dizziness, seizures, syncope,  light-headedness, numbness and headaches.  Hematological: Denies adenopathy. Easy bruising, personal or family bleeding history  Psychiatric/Behavioral: Denies suicidal ideation, mood changes, confusion, nervousness, sleep disturbance and agitation    Physical Exam: Vitals:   06/03/16 1310 06/03/16 1411 06/03/16 1611 06/03/16 1811  BP:  (!) 165/74 (!) 159/74 (!) 146/72  Pulse: 60 62 74 72  Resp:   18 18  Temp:  98.6 F (37 C) 97.5 F (36.4 C) 98.7 F (37.1 C)  TempSrc:  Oral Axillary Oral  SpO2: 98% 100% 100% 99%  Weight:      Height:         Constitutional: NAD, calm, comfortable Eyes: PERRL, lids and conjunctivae normal ENMT: Mucous membranes are moist. Posterior pharynx clear of any exudate or lesions.Normal dentition.  Neck: normal, supple, no masses, no thyromegaly Respiratory: clear to auscultation bilaterally, no wheezing, no crackles. Normal respiratory effort. No accessory muscle use.  Cardiovascular: Regular rate and rhythm, no murmurs / rubs / gallops. No extremity edema. 2+ pedal pulses. No carotid bruits.  Abdomen: no tenderness, no masses palpated. No hepatosplenomegaly. Bowel sounds positive.  Musculoskeletal: no clubbing / cyanosis. No joint deformity upper and lower extremities. Good ROM, no contractures. Normal muscle tone.  Skin: no rashes, lesions, ulcers. No induration Neurologic: CN 2-12 grossly intact. Sensation intact, DTR normal. Strength 5/5 in all 4.  Psychiatric: Normal judgment and insight. Alert and oriented x 3. Normal mood.    Labs on Admission: I have personally reviewed the following labs and imaging studies  CBC:  Recent Labs Lab 06/03/16 0827  WBC 7.4  NEUTROABS 4.5  HGB 12.4  HCT 39.4  MCV 86.2  PLT 185   Basic Metabolic Panel:  Recent Labs Lab 06/03/16 0827  NA 139  K 4.2  CL 102  CO2 28  GLUCOSE 147*  BUN 16  CREATININE 1.35*  CALCIUM 9.8    GFR: Estimated Creatinine Clearance: 35.8 mL/min (A) (by C-G formula based on SCr of 1.35 mg/dL (H)). Liver Function Tests:  Recent Labs Lab 06/03/16 0827  AST 32  ALT 26  ALKPHOS 147*  BILITOT 1.0  PROT 7.8  ALBUMIN 4.0   No results for input(s): LIPASE, AMYLASE in the last 168 hours. No results for input(s): AMMONIA in the last 168 hours. Coagulation Profile:  Recent Labs Lab 06/03/16 0827  INR 0.99   Cardiac Enzymes:  Recent Labs Lab 06/03/16 0827  TROPONINI <0.03   BNP (last 3 results) No results for input(s): PROBNP in the last 8760 hours. HbA1C: No results for input(s): HGBA1C in the last 72 hours. CBG: No results for input(s): GLUCAP in the last 168 hours. Lipid Profile: No results for input(s): CHOL, HDL, LDLCALC, TRIG, CHOLHDL, LDLDIRECT in the last 72 hours. Thyroid Function Tests: No results for input(s): TSH, T4TOTAL, FREET4, T3FREE, THYROIDAB in the last 72 hours. Anemia Panel: No results for input(s): VITAMINB12, FOLATE, FERRITIN, TIBC, IRON, RETICCTPCT in the last 72 hours. Urine analysis:    Component Value Date/Time   COLORURINE YELLOW 06/03/2016 0827   APPEARANCEUR HAZY (A) 06/03/2016 0827   LABSPEC 1.010  06/03/2016 0827   PHURINE 7.0 06/03/2016 0827   GLUCOSEU NEGATIVE 06/03/2016 0827   HGBUR NEGATIVE 06/03/2016 0827   BILIRUBINUR NEGATIVE 06/03/2016 0827   KETONESUR NEGATIVE 06/03/2016 0827   PROTEINUR NEGATIVE 06/03/2016 0827   NITRITE NEGATIVE 06/03/2016 0827   LEUKOCYTESUR LARGE (A) 06/03/2016 0827   Sepsis Labs: @LABRCNTIP (procalcitonin:4,lacticidven:4) )No results found for this or any previous visit (from the past 240 hour(s)).   Radiological Exams on Admission: Ct Head Wo Contrast  Result Date: 06/03/2016 CLINICAL DATA:  Slurred speech.  Weakness EXAM: CT HEAD WITHOUT CONTRAST TECHNIQUE: Contiguous axial images were obtained from the base of the skull through the vertex without intravenous contrast. COMPARISON:  None.  FINDINGS: Brain: Cerebral volume normal for age. Negative for hydrocephalus. Mild white matter ischemic changes appear chronic. Chronic infarct right internal capsule anteriorly Negative for acute infarct.  Negative for acute hemorrhage or mass. Vascular: No hyperdense vessel or unexpected calcification. Skull: No acute skeletal abnormality. 15 mm osteoma left parietal bone projecting into the scalp. This has a benign appearance. Sinuses/Orbits: Negative Other: None IMPRESSION: No acute intracranial abnormality. Chronic ischemic changes as above. Electronically Signed   By: Franchot Gallo M.D.   On: 06/03/2016 08:58   Mr Jodene Nam Head Wo Contrast  Result Date: 06/03/2016 CLINICAL DATA:  New onset of slurred speech with right upper and lower extremity weakness beginning yesterday morning. EXAM: MRA HEAD WITHOUT CONTRAST TECHNIQUE: Angiographic images of the Circle of Willis were obtained using MRA technique without intravenous contrast. COMPARISON:  MRI brain from the same day. FINDINGS: There is mild tortuosity of the cervical internal carotid arteries bilaterally. Mild narrowing of less than 50% is present within the precavernous right internal carotid artery. The right A1 segment is aplastic. The left A1 segment is dominant and normal. Both A2 segments fill. There is moderate stenosis in the mid left M2 segment. There is moderate to high-grade stenosis of the distal right M1 segment. There is significant attenuation of M2 branch vessels bilaterally. Proximal stenosis is noted in the left M2 branches. The left vertebral artery is the dominant vessel. The right PICA origin is visualized. The left PICA is not visualized. The basilar artery is normal. Both posterior cerebral arteries originate from basilar tip. There is a high-grade stenosis in the proximal left posterior cerebral artery which may account for perforator vessels to the area of acute infarction. Moderate stenosis is present in the right P2 segment.  IMPRESSION: 1. High-grade stenosis or occlusion of the left posterior cerebral artery proximally may account for the left corpus callosum infarct. 2. Moderate narrowing of the proximal right posterior cerebral artery. 3. Moderate to high-grade stenosis of the distal right M1 segment, just before the bifurcation. 4. Moderate stenoses of proximal left P2 segments at the left MCA bifurcation. 5. Moderate attenuation of MCA branch vessels, right worse than left. 6. Mild narrowing of the cavernous right internal carotid artery. Electronically Signed   By: San Morelle M.D.   On: 06/03/2016 11:04   Mr Brain Wo Contrast (neuro Protocol)  Result Date: 06/03/2016 CLINICAL DATA:  Slurred speech beginning yesterday morning. Right upper and lower extremity weakness. EXAM: MRI HEAD WITHOUT CONTRAST TECHNIQUE: Multiplanar, multiecho pulse sequences of the brain and surrounding structures were obtained without intravenous contrast. COMPARISON:  CT head without contrast from the same day. FINDINGS: Brain: A 7 mm focus of restricted diffusion is present within the splenium of the corpus callosum on the left. No other acute infarct is present. A remote infarct is present  in the anterior right lentiform nucleus and internal capsule. There is mild ex vacuo dilation of the adjacent ventricle. Remote lacunar infarcts are present within the thalami bilaterally. White matter changes extend into the brainstem. The cerebellum is unremarkable. Vascular: Flow is present in the major intracranial arteries. Skull and upper cervical spine: The skullbase is within normal limits. All midline sagittal structures are unremarkable. Craniocervical junction is within normal limits. Sinuses/Orbits: The paranasal sinuses and mastoid air cells are clear. The globes and orbits are within normal limits. IMPRESSION: 1. 7 mm acute/subacute nonhemorrhagic infarct involving the splenium of the corpus callosum on the left. 2. Remote right basal  ganglia infarct. 3. Remote infarcts of the thalami. 4. Moderate age advanced white matter disease bilaterally extends into the brainstem. Electronically Signed   By: San Morelle M.D.   On: 06/03/2016 10:59    EKG: Independently reviewed. NSR, no acute changes.  Assessment/Plan Active Problems:   CVA (cerebral vascular accident) ([place])    Acute/SubAcute CVA -ECHO/Dopplers. -PT/OT/ST evaluation. -ASA for secondary stroke prevention. -MRA with high grade stenosis of the left posterior cerebral artery. Also diffuse IC vessel disease. -Will request neurology consult to see if IC stenting is an option here.  CML -Continue oral chemotherapeutic agent.  HTN -Allow permissive HTN given acute CVA.   DVT prophylaxis: lovenox  Code Status: full code  Family Communication: multiple family members at bedside updated on plan of care.  Disposition Plan: pending completion of medical w/u and therapy evaluations  Consults called: Neurology  Admission status: observation    Time Spent: 80 minutes  Lelon Frohlich MD [facility] Pager [phone]  If 7PM-7AM, please contact night-coverage [email or web address] Password [facility]  06/03/2016, 6:49 PM

## 2016-06-03 NOTE — Progress Notes (Signed)
Notified NP, pt and family requesting she take her home chemo medications. Awaiting response from NP.

## 2016-06-04 ENCOUNTER — Inpatient Hospital Stay (HOSPITAL_COMMUNITY): Payer: Medicare HMO

## 2016-06-04 DIAGNOSIS — I6350 Cerebral infarction due to unspecified occlusion or stenosis of unspecified cerebral artery: Secondary | ICD-10-CM

## 2016-06-04 DIAGNOSIS — R471 Dysarthria and anarthria: Secondary | ICD-10-CM | POA: Diagnosis not present

## 2016-06-04 DIAGNOSIS — R270 Ataxia, unspecified: Secondary | ICD-10-CM | POA: Diagnosis not present

## 2016-06-04 LAB — ECHOCARDIOGRAM COMPLETE
AVLVOTPG: 6 mmHg
CHL CUP DOP CALC LVOT VTI: 27.9 cm
E decel time: 275 msec
E/e' ratio: 10.18
FS: 29 % (ref 28–44)
HEIGHTINCHES: 66 in
IVS/LV PW RATIO, ED: 1.25
LA diam index: 1.22 cm/m2
LA vol A4C: 43.5 ml
LA vol index: 23.9 mL/m2
LASIZE: 22 mm
LAVOL: 43.1 mL
LEFT ATRIUM END SYS DIAM: 22 mm
LV PW d: 12.7 mm — AB (ref 0.6–1.1)
LV SIMPSON'S DISK: 62
LV dias vol: 60 mL (ref 46–106)
LV e' LATERAL: 6.2 cm/s
LV sys vol: 23 mL
LVDIAVOLIN: 33 mL/m2
LVEEAVG: 10.18
LVEEMED: 10.18
LVOT area: 2.84 cm2
LVOT diameter: 19 mm
LVOT peak vel: 123 cm/s
LVOTSV: 79 mL
LVSYSVOLIN: 13 mL/m2
Lateral S' vel: 13.6 cm/s
MV Dec: 275
MVPKAVEL: 90.5 m/s
MVPKEVEL: 63.1 m/s
Stroke v: 37 ml
TAPSE: 15.9 mm
TDI e' lateral: 6.2
TDI e' medial: 4.57
WEIGHTICAEL: 2448 [oz_av]

## 2016-06-04 LAB — LIPID PANEL
CHOLESTEROL: 299 mg/dL — AB (ref 0–200)
HDL: 34 mg/dL — ABNORMAL LOW (ref >40)
LDL Cholesterol: 222 mg/dL — ABNORMAL HIGH (ref 0–99)
Total CHOL/HDL Ratio: 8.8 RATIO
Triglycerides: 217 mg/dL — ABNORMAL HIGH (ref <150)
VLDL: 43 mg/dL — ABNORMAL HIGH (ref 0–40)

## 2016-06-04 MED ORDER — LOSARTAN POTASSIUM 100 MG PO TABS: 100.0000 mg | ORAL_TABLET | Freq: Two times a day (BID) | ORAL | Status: DC

## 2016-06-04 MED ORDER — ASPIRIN 325 MG PO TABS: 325.0000 mg | ORAL_TABLET | Freq: Every day | ORAL | 4 refills | Status: DC

## 2016-06-04 MED ORDER — ATORVASTATIN CALCIUM 40 MG PO TABS
40.0000 mg | ORAL_TABLET | Freq: Every day | ORAL | Status: DC
Start: 2016-06-04 — End: 2016-06-05
  Administered 2016-06-04: 40 mg via ORAL
  Filled 2016-06-04: qty 1

## 2016-06-04 MED ORDER — ATORVASTATIN CALCIUM 40 MG PO TABS: 40.0000 mg | ORAL_TABLET | Freq: Every day | ORAL | 1 refills | Status: DC

## 2016-06-04 MED ORDER — CLOPIDOGREL BISULFATE 75 MG PO TABS: 75.0000 mg | ORAL_TABLET | Freq: Every day | ORAL | 2 refills | Status: DC

## 2016-06-04 MED ORDER — POLYETHYLENE GLYCOL 3350 17 G PO PACK: 17.0000 g | PACK | Freq: Every day | ORAL | 0 refills | Status: DC | PRN

## 2016-06-04 NOTE — Progress Notes (Signed)
PROGRESS NOTE    MAHIKA VANVOORHIS  BOF:[id number] DOB: Sep 12, 1945 DOA: 06/03/2016 PCP: [name], MD     Brief Narrative:  71 y/o woman admitted from home on 3/26 for slurred speech and found to have an acute CVA. Admission requested.   Assessment & Plan:   Active Problems:   CVA (cerebral vascular accident) ([place])   Acute/subacute left corpus callosum CVA -ASA for secondary stroke prevention. -Started on statin due to hyperlipidemia. -Carotid doppler: Minimal bilateral carotid atherosclerotic vascular disease. No flow limiting stenosis. Degree of stenosis less than 50% bilaterally. -ECHO pending. -PT recs HH/OP PT. -On MRA patient has high grade stenosis of the left posterior cerebral artery that may account for the left corpus callosum infarct as well as multiple other intracranial vascular stenoses. Neurology consultation requested to see if stenting is indicated. If not, may DC home  HTN -Norvasc on hold as to allow for permissive HTN in face of acute CVA.   DVT prophylaxis: lovenox Code Status: full code Family Communication: daughter at bedside Disposition Plan: hope for DC home in 24 hours pending neurology evaluation.  Consultants:   None  Procedures:   None  Antimicrobials:  Anti-infectives    None       Subjective: Feels well, a little weak.  Objective: Vitals:   06/04/16 0211 06/04/16 0611 06/04/16 0624 06/04/16 1011  BP: (!) 145/76 (!) 177/82 (!) 179/80 (!) 173/68  Pulse: (!) 59 68  (!) 59  Resp: 18 18  18   Temp: 98.3 F (36.8 C) 98.4 F (36.9 C)  98.2 F (36.8 C)  TempSrc: Oral Oral  Oral  SpO2: 100% 100%  100%  Weight:      Height:        Intake/Output Summary (Last 24 hours) at 06/04/16 1341 Last data filed at 06/04/16 0500  Gross per 24 hour  Intake            522.5 ml  Output              100 ml  Net            422.5 ml   Filed Weights   06/03/16 0824  Weight: 69.4 kg (153 lb)    Examination:  General exam:  Alert, awake, oriented x 3 Respiratory system: Clear to auscultation. Respiratory effort normal. Cardiovascular system:RRR. No murmurs, rubs, gallops. Gastrointestinal system: Abdomen is nondistended, soft and nontender. No organomegaly or masses felt. Normal bowel sounds heard. Central nervous system: Alert and oriented. No focal neurological deficits. Extremities: No C/C/E, +pedal pulses Skin: No rashes, lesions or ulcers Psychiatry: Judgement and insight appear normal. Mood & affect appropriate.     Data Reviewed: I have personally reviewed following labs and imaging studies  CBC:  Recent Labs Lab 06/03/16 0827  WBC 7.4  NEUTROABS 4.5  HGB 12.4  HCT 39.4  MCV 86.2  PLT 778   Basic Metabolic Panel:  Recent Labs Lab 06/03/16 0827  NA 139  K 4.2  CL 102  CO2 28  GLUCOSE 147*  BUN 16  CREATININE 1.35*  CALCIUM 9.8   GFR: Estimated Creatinine Clearance: 35.8 mL/min (A) (by C-G formula based on SCr of 1.35 mg/dL (H)). Liver Function Tests:  Recent Labs Lab 06/03/16 0827  AST 32  ALT 26  ALKPHOS 147*  BILITOT 1.0  PROT 7.8  ALBUMIN 4.0   No results for input(s): LIPASE, AMYLASE in the last 168 hours. No results for input(s): AMMONIA in the last 168 hours.  Coagulation Profile:  Recent Labs Lab 06/03/16 0827  INR 0.99   Cardiac Enzymes:  Recent Labs Lab 06/03/16 0827  TROPONINI <0.03   BNP (last 3 results) No results for input(s): PROBNP in the last 8760 hours. HbA1C: No results for input(s): HGBA1C in the last 72 hours. CBG: No results for input(s): GLUCAP in the last 168 hours. Lipid Profile:  Recent Labs  06/04/16 0436  CHOL 299*  HDL 34*  LDLCALC 222*  TRIG 217*  CHOLHDL 8.8   Thyroid Function Tests: No results for input(s): TSH, T4TOTAL, FREET4, T3FREE, THYROIDAB in the last 72 hours. Anemia Panel: No results for input(s): VITAMINB12, FOLATE, FERRITIN, TIBC, IRON, RETICCTPCT in the last 72 hours. Urine analysis:    Component  Value Date/Time   COLORURINE YELLOW 06/03/2016 0827   APPEARANCEUR HAZY (A) 06/03/2016 0827   LABSPEC 1.010 06/03/2016 0827   PHURINE 7.0 06/03/2016 0827   GLUCOSEU NEGATIVE 06/03/2016 0827   HGBUR NEGATIVE 06/03/2016 0827   BILIRUBINUR NEGATIVE 06/03/2016 0827   KETONESUR NEGATIVE 06/03/2016 0827   PROTEINUR NEGATIVE 06/03/2016 0827   NITRITE NEGATIVE 06/03/2016 0827   LEUKOCYTESUR LARGE (A) 06/03/2016 0827   Sepsis Labs: @LABRCNTIP (procalcitonin:4,lacticidven:4)  )No results found for this or any previous visit (from the past 240 hour(s)).       Radiology Studies: Ct Head Wo Contrast  Result Date: 06/03/2016 CLINICAL DATA:  Slurred speech.  Weakness EXAM: CT HEAD WITHOUT CONTRAST TECHNIQUE: Contiguous axial images were obtained from the base of the skull through the vertex without intravenous contrast. COMPARISON:  None. FINDINGS: Brain: Cerebral volume normal for age. Negative for hydrocephalus. Mild white matter ischemic changes appear chronic. Chronic infarct right internal capsule anteriorly Negative for acute infarct.  Negative for acute hemorrhage or mass. Vascular: No hyperdense vessel or unexpected calcification. Skull: No acute skeletal abnormality. 15 mm osteoma left parietal bone projecting into the scalp. This has a benign appearance. Sinuses/Orbits: Negative Other: None IMPRESSION: No acute intracranial abnormality. Chronic ischemic changes as above. Electronically Signed   By: Franchot Gallo M.D.   On: 06/03/2016 08:58   Mr Jodene Nam Head Wo Contrast  Result Date: 06/03/2016 CLINICAL DATA:  New onset of slurred speech with right upper and lower extremity weakness beginning yesterday morning. EXAM: MRA HEAD WITHOUT CONTRAST TECHNIQUE: Angiographic images of the Circle of Willis were obtained using MRA technique without intravenous contrast. COMPARISON:  MRI brain from the same day. FINDINGS: There is mild tortuosity of the cervical internal carotid arteries bilaterally. Mild  narrowing of less than 50% is present within the precavernous right internal carotid artery. The right A1 segment is aplastic. The left A1 segment is dominant and normal. Both A2 segments fill. There is moderate stenosis in the mid left M2 segment. There is moderate to high-grade stenosis of the distal right M1 segment. There is significant attenuation of M2 branch vessels bilaterally. Proximal stenosis is noted in the left M2 branches. The left vertebral artery is the dominant vessel. The right PICA origin is visualized. The left PICA is not visualized. The basilar artery is normal. Both posterior cerebral arteries originate from basilar tip. There is a high-grade stenosis in the proximal left posterior cerebral artery which may account for perforator vessels to the area of acute infarction. Moderate stenosis is present in the right P2 segment. IMPRESSION: 1. High-grade stenosis or occlusion of the left posterior cerebral artery proximally may account for the left corpus callosum infarct. 2. Moderate narrowing of the proximal right posterior cerebral artery. 3. Moderate to  high-grade stenosis of the distal right M1 segment, just before the bifurcation. 4. Moderate stenoses of proximal left P2 segments at the left MCA bifurcation. 5. Moderate attenuation of MCA branch vessels, right worse than left. 6. Mild narrowing of the cavernous right internal carotid artery. Electronically Signed   By: San Morelle M.D.   On: 06/03/2016 11:04   Mr Brain Wo Contrast (neuro Protocol)  Result Date: 06/03/2016 CLINICAL DATA:  Slurred speech beginning yesterday morning. Right upper and lower extremity weakness. EXAM: MRI HEAD WITHOUT CONTRAST TECHNIQUE: Multiplanar, multiecho pulse sequences of the brain and surrounding structures were obtained without intravenous contrast. COMPARISON:  CT head without contrast from the same day. FINDINGS: Brain: A 7 mm focus of restricted diffusion is present within the splenium of the  corpus callosum on the left. No other acute infarct is present. A remote infarct is present in the anterior right lentiform nucleus and internal capsule. There is mild ex vacuo dilation of the adjacent ventricle. Remote lacunar infarcts are present within the thalami bilaterally. White matter changes extend into the brainstem. The cerebellum is unremarkable. Vascular: Flow is present in the major intracranial arteries. Skull and upper cervical spine: The skullbase is within normal limits. All midline sagittal structures are unremarkable. Craniocervical junction is within normal limits. Sinuses/Orbits: The paranasal sinuses and mastoid air cells are clear. The globes and orbits are within normal limits. IMPRESSION: 1. 7 mm acute/subacute nonhemorrhagic infarct involving the splenium of the corpus callosum on the left. 2. Remote right basal ganglia infarct. 3. Remote infarcts of the thalami. 4. Moderate age advanced white matter disease bilaterally extends into the brainstem. Electronically Signed   By: San Morelle M.D.   On: 06/03/2016 10:59   US Carotid Bilateral (at [facility] And [facility] Only)  Result Date: 06/04/2016 CLINICAL DATA:  CVA. EXAM: BILATERAL CAROTID DUPLEX ULTRASOUND TECHNIQUE: Pearline Cables scale imaging, color Doppler and duplex ultrasound were performed of bilateral carotid and vertebral arteries in the neck. COMPARISON:  MRI 06/03/2016. FINDINGS: Criteria: Quantification of carotid stenosis is based on velocity parameters that correlate the residual internal carotid diameter with NASCET-based stenosis levels, using the diameter of the distal internal carotid lumen as the denominator for stenosis measurement. The following velocity measurements were obtained: RIGHT ICA:  59/19 cm/sec CCA:  99/24 cm/sec SYSTOLIC ICA/CCA RATIO:  0.9 DIASTOLIC ICA/CCA RATIO:  1.6 ECA:  62 cm/sec LEFT ICA:  56/21 cm/sec CCA:  26/83 cm/sec SYSTOLIC ICA/CCA RATIO:  0.9 DIASTOLIC ICA/CCA RATIO:  1.3 ECA:  43 cm/sec RIGHT CAROTID  ARTERY: Minimal carotid atherosclerotic vascular disease. No flow limiting stenosis. RIGHT VERTEBRAL ARTERY:  Patent with antegrade flow. LEFT CAROTID ARTERY: Minimal atherosclerotic vascular disease. No flow limiting stenosis. LEFT VERTEBRAL ARTERY:  Patent with antegrade flow. IMPRESSION: 1. Minimal bilateral carotid atherosclerotic vascular disease. No flow limiting stenosis. Degree of stenosis less than 50% bilaterally. 2. Vertebrals are patent with antegrade flow . Electronically Signed   By: Marcello Moores  Register   On: 06/04/2016 09:23        Scheduled Meds: . aspirin  300 mg Rectal Daily   Or  . aspirin  325 mg Oral Daily  . atorvastatin  40 mg Oral q1800  . enoxaparin (LOVENOX) injection  40 mg Subcutaneous Q24H  . multivitamin with minerals  1 tablet Oral Daily  . nilotinib  300 mg Oral Q12H   Continuous Infusions: . sodium chloride 50 mL/hr at 06/03/16 1833     LOS: 1 day    Time spent: 25 minutes. Greater  than 50% of this time was spent in direct contact with the patient coordinating care.     Lelon Frohlich, MD [facility] Pager [phone]  If 7PM-7AM, please contact night-coverage [email or web address] Password [email or web address] 06/04/2016, 1:41 PM

## 2016-06-04 NOTE — Evaluation (Signed)
Speech Language Pathology Evaluation Patient Details Name: April Page MRN: [id number] DOB: 1945-12-04 Today's Date: 06/04/2016 Time: 4098-1191 SLP Time Calculation (min) (ACUTE ONLY): 25 min  Problem List:  Patient Active Problem List   Diagnosis Date Noted  . CVA (cerebral vascular accident) ([place]) 06/03/2016  . Lightheadedness 04/13/2016  . Hypokalemia 04/13/2016  . Hyperglycemia 04/13/2016  . Essential hypertension 04/13/2016  . Hyperlipidemia 04/13/2016  . S/P right hip fracture 04/12/2016  . Hip fracture ([place]) 04/12/2016  . Fall   . Itching in the vaginal area 10/05/2014  . Chronic myelogenous leukemia (CML), BCR-ABL1-positive ([place])   . GERD (gastroesophageal reflux disease) 12/06/2013  . Leukocytosis 08/06/2013  . Diabetes type 2, uncontrolled ([place]) 08/06/2013  . Atypical chest pain 08/06/2013  . Chronic diarrhea 08/05/2013  . Pessary maintenance 07/01/2013   Past Medical History:  Past Medical History:  Diagnosis Date  . Arthritis    stiff knees  . Blood dyscrasia   . Cancer ([place])   . GERD (gastroesophageal reflux disease)   . H/O: CML (chronic myeloid leukemia)   . Hyperlipidemia   . Hypertension   . Obesity   . Pessary maintenance 07/01/2013   Past Surgical History:  Past Surgical History:  Procedure Laterality Date  . ABDOMINAL HYSTERECTOMY    . CHOLECYSTECTOMY    . COLONOSCOPY  06/09/2002   YNW:GNFAOZHY hemorrhoids; otherwise normal rectum, colon   . COLONOSCOPY N/A 08/25/2013   QMV:HQIONGE diverticulosis. Single colonic polyp-removed  s/p segmental biopsy and stool sample. random colon bx negative. +benign leiomyoma.  . cystocele/rectocele repair  2009  . ESOPHAGOGASTRODUODENOSCOPY  06/09/2002   XBM:[id number] upper gastrointestinal tract s/p  54-French Maloney dilator  . FEMUR IM NAIL Right 04/12/2016   Procedure: INTRAMEDULLARY (IM) NAIL FEMORAL;  Surgeon: Nicholes Stairs, MD;  Location: [facility];  Service: Orthopedics;  Laterality: Right;  . HERNIA  REPAIR    . OOPHORECTOMY     HPI:  The patient is a 71 year old black female who was noted to have dysarthria on Sunday morning. This was discovered by her daughter. The patient herself does not report much problems. She has had some gait ataxia for the last week or so. No focal weakness is reported. No numbness. No headaches. No dizziness. No chest pain or shortness of breath. The review systems otherwise negative. MRI showed 7 mm acute/subacute nonhemorrhagic infarct involving the splenium of the corpus callosum on the left.   Assessment / Plan / Recommendation Clinical Impression  Pt presents with mild impairment in speech intelligibility due to mild dysarthria from acute stroke; otherwise cognitive linguistic skills appear WFL at this time. SLP introduced oral motor excercises for mild right labial weakness and suggested speech intelligibility strategies. Pt reports improvement in her speech since yesterday, but states it is not yet back to baseline. Recommend [place] SLP for brief treatment to address dysarthria.    SLP Assessment  SLP Recommendation/Assessment: All further Speech Lanaguage Pathology  needs can be addressed in the next venue of care SLP Visit Diagnosis: Dysarthria and anarthria (R47.1)    Follow Up Recommendations  Home health SLP    Frequency and Duration           SLP Evaluation Cognition  Overall Cognitive Status: Within Functional Limits for tasks assessed Arousal/Alertness: Awake/alert Orientation Level: Oriented X4 Memory: Appears intact Awareness: Appears intact Problem Solving: Appears intact Safety/Judgment: Appears intact       Comprehension  Auditory Comprehension Overall Auditory Comprehension: Appears within functional limits for tasks assessed Yes/No Questions: Within  Functional Limits Commands: Within Functional Limits Conversation: Complex Visual Recognition/Discrimination Discrimination: Not tested Reading Comprehension Reading Status: Not  tested    Expression Expression Primary Mode of Expression: Verbal Verbal Expression Overall Verbal Expression: Appears within functional limits for tasks assessed Initiation: No impairment Automatic Speech: Name;Day of week Level of Generative/Spontaneous Verbalization: Conversation Repetition: No impairment Naming: No impairment Pragmatics: No impairment Interfering Components: Speech intelligibility Non-Verbal Means of Communication: Not applicable Written Expression Dominant Hand: Right Written Expression: Not tested   Oral / Motor  Oral Motor/Sensory Function Overall Oral Motor/Sensory Function: Mild impairment Facial ROM: Reduced right Facial Symmetry: Abnormal symmetry right Facial Strength: Within Functional Limits Facial Sensation: Within Functional Limits Lingual ROM: Within Functional Limits Lingual Symmetry: Abnormal symmetry right Lingual Strength: Within Functional Limits Lingual Sensation: Within Functional Limits Velum: Within Functional Limits Mandible: Within Functional Limits Motor Speech Overall Motor Speech: Impaired Respiration: Within functional limits Phonation: Normal Resonance: Within functional limits Articulation: Impaired Intelligibility: Intelligibility reduced Conversation: 75-100% accurate Motor Planning: Witnin functional limits Effective Techniques: Slow rate;Increased vocal intensity   Thank you,  Genene Churn, [place]                     [place] 06/04/2016, 7:40 PM

## 2016-06-04 NOTE — Progress Notes (Signed)
OT Cancellation Note  Patient Details Name: [name] MRN: [id number] DOB: 1945-03-31   Cancelled Treatment:    Reason Eval/Treat Not Completed: OT screened, no needs identified, will sign off   Ailene Ravel, OTR/L,CBIS  [phone]  06/04/2016, 2:29 PM

## 2016-06-04 NOTE — Consult Note (Signed)
April A. Merlene Laughter, MD     [email or web address]          April Page is an 71 y.o. female.   ASSESSMENT/PLAN: 1.  Acute infarct involving the sputum of the corpus callosum on the left side. I suspect that this is most likely due to high-grade stenosis of the left PCA. Risk factor dyslipidemia, hypertension and age. I recommend that the patient be placed on dual antiplatelet agents for 3 months. Subsequently, she should be on a single agent. Aspirin 325 is preferred. Stenting has been appropriately added. The patient should follow-up in the office with [place] in one month. Patient can restart her baseline antihypertensive medications tomorrow. There is no need for stenting of the intracranial lesions.  2. Multivessel intracranial occlusive disease.  3. Marked Constipation. A bowel regimen is suggested to prevent increase in blood pressure during bowel movements.         The patient is a 71 year old black female who was noted to have dysarthria on Sunday morning. This was discovered by her daughter. The patient herself does not report much problems. She has had some gait ataxia for the last week or so. No focal weakness is reported. No numbness. No headaches. No dizziness. No chest pain or shortness of breath. The review systems otherwise negative.    GENERAL: This a very pleasant female in no acute distress.  HEENT: Normal  ABDOMEN: soft  EXTREMITIES: No edema   BACK: Normal  SKIN: Normal by inspection.    MENTAL STATUS: Alert and oriented - including age and month. Speech is mildly dysarthric; language and cognition are generally intact. Judgment and insight normal.   CRANIAL NERVES: Pupils are equal, round and reactive to light and accomodation; extra ocular movements are full, there is no significant nystagmus; visual fields are full; upper and lower facial muscles are normal in strength and symmetric, there is no flattening of the nasolabial folds;  tongue is midline; uvula is midline; shoulder elevation is normal.  MOTOR: Normal tone, bulk and strength; it does appears to be a mild drift of the right upper extremity and right leg. No drift on the left side.  COORDINATION: Left finger to nose is normal, right finger to nose is normal, No rest tremor; no intention tremor; no postural tremor; no bradykinesia.  REFLEXES: Deep tendon reflexes are symmetrical and normal. Babinski reflexes are flexor bilaterally.   SENSATION: Normal to light touch, temperature, and pinprick. The patient does not extinguish to double simultaneous tactile or visual stimulation.     NIH stroke scale 3.   Blood pressure (!) 156/69, pulse 66, temperature 98.5 F (36.9 C), temperature source Oral, resp. rate 18, height 5' 6" (1.676 m), weight 153 lb (69.4 kg), SpO2 98 %.  Past Medical History:  Diagnosis Date  . Arthritis    stiff knees  . Blood dyscrasia   . Cancer ([place])   . GERD (gastroesophageal reflux disease)   . H/O: CML (chronic myeloid leukemia)   . Hyperlipidemia   . Hypertension   . Obesity   . Pessary maintenance 07/01/2013    Past Surgical History:  Procedure Laterality Date  . ABDOMINAL HYSTERECTOMY    . CHOLECYSTECTOMY    . COLONOSCOPY  06/09/2002   EXN:TZGYFVCB hemorrhoids; otherwise normal rectum, colon   . COLONOSCOPY N/A 08/25/2013   SWH:QPRFFMB diverticulosis. Single colonic polyp-removed  s/p segmental biopsy and stool sample. random colon bx negative. +benign leiomyoma.  . cystocele/rectocele repair  2009  . ESOPHAGOGASTRODUODENOSCOPY  06/09/2002  JKD:TOIZTI upper gastrointestinal tract s/p  54-French Maloney dilator  . FEMUR IM NAIL Right 04/12/2016   Procedure: INTRAMEDULLARY (IM) NAIL FEMORAL;  Surgeon: Nicholes Stairs, MD;  Location: [facility];  Service: Orthopedics;  Laterality: Right;  . HERNIA REPAIR    . OOPHORECTOMY      Family History  Problem Relation Age of Onset  . Anuerysm Mother     deceased age 67,  brain anuerysm  . Early death Mother 39  . Heart disease Father   . Hypertension Sister   . Obesity Brother   . Hypertension Sister   . Arthritis Sister   . Other Son     cardiac arrest  . Heart disease Child 84    cardiac arrest  . Colon cancer Neg Hx     Social History:  reports that she has never smoked. She has never used smokeless tobacco. She reports that she does not drink alcohol or use drugs.  Allergies: No Known Allergies  Medications: Prior to Admission medications   Medication Sig Start Date End Date Taking? Authorizing Provider  Docusate Sodium (COLACE PO) Take 1 capsule by mouth at bedtime.   Yes Historical Provider, MD  losartan (COZAAR) 100 MG tablet Take 1 tablet by mouth 2 (two) times daily. 04/20/16  Yes Historical Provider, MD  Multiple Vitamin (MULTIVITAMIN WITH MINERALS) TABS tablet Take 1 tablet by mouth daily.   Yes Historical Provider, MD  nilotinib (TASIGNA) 150 MG capsule Take 2 capsules (300 mg total) by mouth every 12 (twelve) hours. 09/05/15  Yes Manon Hilding [name], PA-C  lisinopril (PRINIVIL,ZESTRIL) 10 MG tablet Take 1 tablet (10 mg total) by mouth daily. Patient not taking: Reported on 06/03/2016 01/17/15   Baird Cancer, PA-C    Scheduled Meds: . aspirin  300 mg Rectal Daily   Or  . aspirin  325 mg Oral Daily  . atorvastatin  40 mg Oral q1800  . enoxaparin (LOVENOX) injection  40 mg Subcutaneous Q24H  . multivitamin with minerals  1 tablet Oral Daily  . nilotinib  300 mg Oral Q12H   Continuous Infusions: . sodium chloride 50 mL/hr at 06/04/16 1436   PRN Meds:.acetaminophen **OR** acetaminophen (TYLENOL) oral liquid 160 mg/5 mL **OR** acetaminophen, senna-docusate     Results for orders placed or performed during the hospital encounter of 06/03/16 (from the past 48 hour(s))  Ethanol     Status: None   Collection Time: 06/03/16  8:27 AM  Result Value Ref Range   Alcohol, Ethyl (B) <5 <5 mg/dL    Comment:        LOWEST DETECTABLE LIMIT  FOR SERUM ALCOHOL IS 5 mg/dL FOR MEDICAL PURPOSES ONLY   Protime-INR     Status: None   Collection Time: 06/03/16  8:27 AM  Result Value Ref Range   Prothrombin Time 13.1 11.4 - 15.2 seconds   INR 0.99   APTT     Status: None   Collection Time: 06/03/16  8:27 AM  Result Value Ref Range   aPTT 27 24 - 36 seconds  CBC     Status: None   Collection Time: 06/03/16  8:27 AM  Result Value Ref Range   WBC 7.4 4.0 - 10.5 K/uL   RBC 4.57 3.87 - 5.11 MIL/uL   Hemoglobin 12.4 12.0 - 15.0 g/dL   HCT 39.4 36.0 - 46.0 %   MCV 86.2 78.0 - 100.0 fL   MCH 27.1 26.0 - 34.0 pg   MCHC 31.5 30.0 - 36.0  g/dL   RDW 14.8 11.5 - 15.5 %   Platelets 315 150 - 400 K/uL  Differential     Status: None   Collection Time: 06/03/16  8:27 AM  Result Value Ref Range   Neutrophils Relative % 61 %   Neutro Abs 4.5 1.7 - 7.7 K/uL   Lymphocytes Relative 29 %   Lymphs Abs 2.2 0.7 - 4.0 K/uL   Monocytes Relative 8 %   Monocytes Absolute 0.6 0.1 - 1.0 K/uL   Eosinophils Relative 1 %   Eosinophils Absolute 0.1 0.0 - 0.7 K/uL   Basophils Relative 0 %   Basophils Absolute 0.0 0.0 - 0.1 K/uL  Comprehensive metabolic panel     Status: Abnormal   Collection Time: 06/03/16  8:27 AM  Result Value Ref Range   Sodium 139 135 - 145 mmol/L   Potassium 4.2 3.5 - 5.1 mmol/L   Chloride 102 101 - 111 mmol/L   CO2 28 22 - 32 mmol/L   Glucose, Bld 147 (H) 65 - 99 mg/dL   BUN 16 6 - 20 mg/dL   Creatinine, Ser 1.35 (H) 0.44 - 1.00 mg/dL   Calcium 9.8 8.9 - 10.3 mg/dL   Total Protein 7.8 6.5 - 8.1 g/dL   Albumin 4.0 3.5 - 5.0 g/dL   AST 32 15 - 41 U/L   ALT 26 14 - 54 U/L   Alkaline Phosphatase 147 (H) 38 - 126 U/L   Total Bilirubin 1.0 0.3 - 1.2 mg/dL   GFR calc non Af Amer 38 (L) >60 mL/min   GFR calc Af Amer 45 (L) >60 mL/min    Comment: (NOTE) The eGFR has been calculated using the CKD EPI equation. This calculation has not been validated in all clinical situations. eGFR's persistently <60 mL/min signify possible  Chronic Kidney Disease.    Anion gap 9 5 - 15  Urine rapid drug screen (hosp performed)not at [facility]     Status: None   Collection Time: 06/03/16  8:27 AM  Result Value Ref Range   Opiates NONE DETECTED NONE DETECTED   Cocaine NONE DETECTED NONE DETECTED   Benzodiazepines NONE DETECTED NONE DETECTED   Amphetamines NONE DETECTED NONE DETECTED   Tetrahydrocannabinol NONE DETECTED NONE DETECTED   Barbiturates NONE DETECTED NONE DETECTED    Comment:        DRUG SCREEN FOR MEDICAL PURPOSES ONLY.  IF CONFIRMATION IS NEEDED FOR ANY PURPOSE, NOTIFY LAB WITHIN 5 DAYS.        LOWEST DETECTABLE LIMITS FOR URINE DRUG SCREEN Drug Class       Cutoff (ng/mL) Amphetamine      1000 Barbiturate      200 Benzodiazepine   299 Tricyclics       242 Opiates          300 Cocaine          300 THC              50   Urinalysis, Routine w reflex microscopic     Status: Abnormal   Collection Time: 06/03/16  8:27 AM  Result Value Ref Range   Color, Urine YELLOW YELLOW   APPearance HAZY (A) CLEAR   Specific Gravity, Urine 1.010 1.005 - 1.030   pH 7.0 5.0 - 8.0   Glucose, UA NEGATIVE NEGATIVE mg/dL   Hgb urine dipstick NEGATIVE NEGATIVE   Bilirubin Urine NEGATIVE NEGATIVE   Ketones, ur NEGATIVE NEGATIVE mg/dL   Protein, ur NEGATIVE NEGATIVE mg/dL   Nitrite  NEGATIVE NEGATIVE   Leukocytes, UA LARGE (A) NEGATIVE   RBC / HPF 0-5 0 - 5 RBC/hpf   WBC, UA 6-30 0 - 5 WBC/hpf   Bacteria, UA RARE (A) NONE SEEN   Squamous Epithelial / LPF 0-5 (A) NONE SEEN   Mucous PRESENT    Hyaline Casts, UA PRESENT   Troponin I     Status: None   Collection Time: 06/03/16  8:27 AM  Result Value Ref Range   Troponin I <0.03 <0.03 ng/mL  Lipid panel     Status: Abnormal   Collection Time: 06/04/16  4:36 AM  Result Value Ref Range   Cholesterol 299 (H) 0 - 200 mg/dL   Triglycerides 217 (H) <150 mg/dL   HDL 34 (L) >40 mg/dL   Total CHOL/HDL Ratio 8.8 RATIO   VLDL 43 (H) 0 - 40 mg/dL   LDL Cholesterol 222 (H) 0 - 99  mg/dL    Comment:        Total Cholesterol/HDL:CHD Risk Coronary Heart Disease Risk Table                     Men   Women  1/2 Average Risk   3.4   3.3  Average Risk       5.0   4.4  2 X Average Risk   9.6   7.1  3 X Average Risk  23.4   11.0        Use the calculated Patient Ratio above and the CHD Risk Table to determine the patient's CHD Risk.        ATP III CLASSIFICATION (LDL):  <100     mg/dL   Optimal  100-129  mg/dL   Near or Above                    Optimal  130-159  mg/dL   Borderline  160-189  mg/dL   High  >190     mg/dL   Very High     Studies/Results:   CAROTID DOPPLERS 1. Minimal bilateral carotid atherosclerotic vascular disease. No flow limiting stenosis. Degree of stenosis less than 50% bilaterally.  2. Vertebrals are patent with antegrade flow     BRAIN MRA FINDINGS: There is mild tortuosity of the cervical internal carotid arteries bilaterally. Mild narrowing of less than 50% is present within the precavernous right internal carotid artery. The right A1 segment is aplastic. The left A1 segment is dominant and normal. Both A2 segments fill. There is moderate stenosis in the mid left M2 segment. There is moderate to high-grade stenosis of the distal right M1 segment. There is significant attenuation of M2 branch vessels bilaterally. Proximal stenosis is noted in the left M2 branches.  The left vertebral artery is the dominant vessel. The right PICA origin is visualized. The left PICA is not visualized. The basilar artery is normal. Both posterior cerebral arteries originate from basilar tip. There is a high-grade stenosis in the proximal left posterior cerebral artery which may account for perforator vessels to the area of acute infarction. Moderate stenosis is present in the right P2 segment.  IMPRESSION: 1. High-grade stenosis or occlusion of the left posterior cerebral artery proximally may account for the left corpus callosum  infarct. 2. Moderate narrowing of the proximal right posterior cerebral artery. 3. Moderate to high-grade stenosis of the distal right M1 segment, just before the bifurcation. 4. Moderate stenoses of proximal left P2 segments at the left MCA bifurcation. 5.  Moderate attenuation of MCA branch vessels, right worse than left. 6. Mild narrowing of the cavernous right internal carotid artery.     BRAIN MRI FINDINGS: Brain: A 7 mm focus of restricted diffusion is present within the splenium of the corpus callosum on the left. No other acute infarct is present. A remote infarct is present in the anterior right lentiform nucleus and internal capsule. There is mild ex vacuo dilation of the adjacent ventricle. Remote lacunar infarcts are present within the thalami bilaterally. White matter changes extend into the brainstem. The cerebellum is unremarkable.  Vascular: Flow is present in the major intracranial arteries.  Skull and upper cervical spine: The skullbase is within normal limits. All midline sagittal structures are unremarkable. Craniocervical junction is within normal limits.  Sinuses/Orbits: The paranasal sinuses and mastoid air cells are clear. The globes and orbits are within normal limits.  IMPRESSION: 1. 7 mm acute/subacute nonhemorrhagic infarct involving the splenium of the corpus callosum on the left. 2. Remote right basal ganglia infarct. 3. Remote infarcts of the thalami. 4. Moderate age advanced white matter disease bilaterally extends into the brainstem.       The brain MRI and MRA are reviewed in person. There is a small Infarcts seen on DWI with consistency suggestive of a subacute infarct. It is seen on 2 cuts. There is also a chronic infarct with increased signal on FLAIR and reduced signal/encephalomalacia involving the right basal ganglia particularly involving the caudate Nucleus.   MRA shows high-grade stenosis involving the left PCA. There is  also moderate stenosis involving the MCA on the right side and also some luminal irregularities more distal to this. There is also some low but no irregularities involving the left PCA.     ECHO Study Conclusions  - Left ventricle: The cavity size was normal. Wall thickness was   increased in a pattern of moderate LVH. Systolic function was   normal. The estimated ejection fraction was in the range of 60%   to 65%. Doppler parameters are consistent with abnormal left   ventricular relaxation (grade 1 diastolic dysfunction). - Aortic valve: Mildly calcified annulus. Normal thickness   leaflets. Valve area (VTI): 2.61 cm^2. Valve area (Vmax): 2.18   cm^2. Valve area (Vmean): 2.46 cm^2. - Atrial septum: No defect or patent foramen ovale was identified. - Technically difficult study.       Marijah Larranaga A. Merlene Page, M.D.  Diplomate, Tax adviser of Psychiatry and Neurology ( Neurology). 06/04/2016, 5:02 PM

## 2016-06-04 NOTE — Evaluation (Signed)
Physical Therapy Evaluation Patient Details Name: April Page MRN: [id number] DOB: [birth year] Today's Date: 06/04/2016   History of Present Illness  71 y.o. female with h/o HTN, MO, HLD here with slurred speech that the daughter noticed yesterday. For a few weeks she has also been "weak" and has been "moving more slowly". Labs WNL, CT head without acute disease, MRI brain IMPRESSION: 7 mm acute/subacute nonhemorrhagic infarct involving the splenium of the corpus callosum on the left.    Clinical Impression  Pt received in bed, dtr present, and pt is agreeable to PT evaluation.  Pt normally ambulates with RW household distances.  Her dtr has been providing 24/7 supervision/assistance since her hip fx in February of this year.  She has required assistance for both dressing and bathing since that time.  During PT evaluation today, she demonstrated all functional mobility at modified independent level.  She ambulated 160f with RW and Mod (I).  Recommend that she discharge home with continued HHPT for continued strengthening and balance training.  Recommend that after she completes her HHPT that she f/u with OPPT for continued strength, endurance, and balance training to optimize her return to PLOF.      Follow Up Recommendations Home health PT;Supervision/Assistance - 24 hour    Equipment Recommendations  None recommended by PT    Recommendations for Other Services       Precautions / Restrictions Precautions Precautions: Fall Precaution Comments: Fall in Feb where she sustained a R hip fx and is s/p IM nail  Restrictions Weight Bearing Restrictions: No      Mobility  Bed Mobility Overal bed mobility: Modified Independent                Transfers Overall transfer level: Modified independent Equipment used: Rolling walker (2 wheeled)                Ambulation/Gait Ambulation/Gait assistance: Modified independent (Device/Increase time) Ambulation Distance (Feet):  100 Feet Assistive device: Rolling walker (2 wheeled) Gait Pattern/deviations: Step-to pattern;Trendelenburg   Gait velocity interpretation: <1.8 ft/sec, indicative of risk for recurrent falls General Gait Details: Noted R trendelenburg during gait with increased weight bearing through UE's during stance phase on the right.    Stairs            Wheelchair Mobility    Modified Rankin (Stroke Patients Only) Modified Rankin (Stroke Patients Only) Pre-Morbid Rankin Score: Moderate disability Modified Rankin: Moderate disability     Balance Overall balance assessment: History of Falls;Modified Independent Sitting-balance support: Bilateral upper extremity supported;Feet supported Sitting balance-Leahy Scale: Good     Standing balance support: Bilateral upper extremity supported Standing balance-Leahy Scale: Fair                               Pertinent Vitals/Pain Pain Assessment: No/denies pain    Home Living   Living Arrangements: Alone (Dtr has been staying with her.  ) Available Help at Discharge: Available 24 hours/day;Home health (HHPT after recent R hip fx. ) Type of Home: Apartment Home Access: Level entry     Home Layout: One level Home Equipment: Cane - single point;Walker - 2 wheels;Bedside commode;Shower seat;Wheelchair - mRegulatory affairs officer/ Transfers Assistance Needed: pt ambulates with RW.  Pt states she was mostly doing household mobility, and outside to the car, but not to the community.    ADL's / [employer]  Needed: Dtr assist for dressing, and bathing - sponge bathing.         Hand Dominance   Dominant Hand: Right    Extremity/Trunk Assessment   Upper Extremity Assessment Upper Extremity Assessment: RUE deficits/detail RUE Deficits / Details: Shoulder flexion 3/5, grip slightly weaker on the right compared to the left, but nothing signficant.     Lower Extremity Assessment Lower Extremity  Assessment: Generalized weakness;RLE deficits/detail RLE Deficits / Details: R hip with weakness noted during gait - however, this seems to be related to previous R hip surgery 1 month ago.        Communication   Communication: No difficulties  Cognition Arousal/Alertness: Awake/alert Behavior During Therapy: WFL for tasks assessed/performed Overall Cognitive Status: Within Functional Limits for tasks assessed                                        General Comments      Exercises     Assessment/Plan    PT Assessment All further PT needs can be met in the next venue of care  PT Problem List Decreased strength;Decreased activity tolerance;Decreased balance;Decreased mobility;Decreased knowledge of use of DME       PT Treatment Interventions      PT Goals (Current goals can be found in the Care Plan section)  Acute Rehab PT Goals Patient Stated Goal: to go home PT Goal Formulation: With patient/family Time For Goal Achievement: 06/11/16 Potential to Achieve Goals: Good    Frequency     Barriers to discharge        Co-evaluation               End of Session Equipment Utilized During Treatment: Gait belt Activity Tolerance: Patient tolerated treatment well Patient left: in bed;with call bell/phone within reach;with family/visitor present (Getting ready for Echo) Nurse Communication: Mobility status (mobiltiy sheet left up in the room. ) PT Visit Diagnosis: Other abnormalities of gait and mobility (R26.89);Muscle weakness (generalized) (M62.81);History of falling (Z91.81)    Time: 1610-9604 PT Time Calculation (min) (ACUTE ONLY): 33 min   Charges:   PT Evaluation $PT Eval Low Complexity: 1 Procedure PT Treatments $Gait Training: 8-22 mins   PT G Codes:   PT G-Codes **NOT FOR INPATIENT CLASS** Functional Assessment Tool Used: AM-PAC 6 Clicks Basic Mobility;Clinical judgement Functional Limitation: Mobility: Walking and moving  around Mobility: Walking and Moving Around Current Status (V4098): At least 20 percent but less than 40 percent impaired, limited or restricted Mobility: Walking and Moving Around Goal Status [phone]): At least 1 percent but less than 20 percent impaired, limited or restricted    Beth Zaylia Riolo, PT, DPT X: [phone]

## 2016-06-04 NOTE — Care Management Note (Signed)
Case Management Note  Patient Details  Name: April Page MRN: [id number] Date of Birth: [birth year]  Subjective/Objective:  Patient adm with CVA. Recommended for [facility] PT. From home with family, all at bedside. She is active with [facility] for RN and PT services and wishes to continue with [facility] at time of discharge. She has no DME needs, already has RW, 3in1 and WC PTA.                  Action/Plan: [place] home with Home health services.    Expected Discharge Date:      06/05/2016           Expected Discharge Plan:  [place]  In-House Referral:     Discharge planning Services  CM Consult  Post Acute Care Choice:  Home Health, Resumption of Svcs/PTA Provider Choice offered to:     DME Arranged:    DME Agency:    HH Arranged:    [place] Agency:  [place]  Status of Service:  Completed, signed off  If discussed at [place] of Stay Meetings, dates discussed:    Additional Comments:  Siddhi Dornbush, Chauncey [place], RN 06/04/2016, 12:16 PM

## 2016-06-04 NOTE — Progress Notes (Signed)
*  PRELIMINARY RESULTS* Echocardiogram 2D Echocardiogram has been performed.  Samuel Germany 06/04/2016, 10:44 AM

## 2016-06-05 ENCOUNTER — Ambulatory Visit (HOSPITAL_COMMUNITY): Payer: Medicare HMO

## 2016-06-05 ENCOUNTER — Encounter (HOSPITAL_COMMUNITY): Payer: Self-pay

## 2016-06-05 DIAGNOSIS — R4781 Slurred speech: Secondary | ICD-10-CM

## 2016-06-05 DIAGNOSIS — I6350 Cerebral infarction due to unspecified occlusion or stenosis of unspecified cerebral artery: Secondary | ICD-10-CM

## 2016-06-05 DIAGNOSIS — R4789 Other speech disturbances: Secondary | ICD-10-CM

## 2016-06-05 DIAGNOSIS — I10 Essential (primary) hypertension: Secondary | ICD-10-CM

## 2016-06-05 DIAGNOSIS — R7302 Impaired glucose tolerance (oral): Secondary | ICD-10-CM

## 2016-06-05 DIAGNOSIS — I639 Cerebral infarction, unspecified: Principal | ICD-10-CM

## 2016-06-05 LAB — HEMOGLOBIN A1C
Hgb A1c MFr Bld: 6.4 % — ABNORMAL HIGH (ref 4.8–5.6)
MEAN PLASMA GLUCOSE: 137 mg/dL

## 2016-06-05 MED ORDER — POLYETHYLENE GLYCOL 3350 17 G PO PACK
17.0000 g | PACK | Freq: Every day | ORAL | Status: DC
  Administered 2016-06-05: 17 g via ORAL
  Filled 2016-06-05: qty 1

## 2016-06-05 MED ORDER — CLOPIDOGREL BISULFATE 75 MG PO TABS
75.0000 mg | ORAL_TABLET | Freq: Every day | ORAL | Status: DC
  Administered 2016-06-05: 75 mg via ORAL
  Filled 2016-06-05: qty 1

## 2016-06-05 MED ORDER — SENNA 8.6 MG PO TABS: 1.0000 | ORAL_TABLET | Freq: Every day | ORAL | 0 refills | Status: DC

## 2016-06-05 MED ORDER — POLYETHYLENE GLYCOL 3350 17 G PO PACK: 17.0000 g | PACK | Freq: Every day | ORAL | 0 refills | Status: AC

## 2016-06-05 MED ORDER — LOSARTAN POTASSIUM 100 MG PO TABS: 100.0000 mg | ORAL_TABLET | Freq: Every day | ORAL | 0 refills | Status: DC

## 2016-06-05 MED ORDER — SENNA 8.6 MG PO TABS
1.0000 | ORAL_TABLET | Freq: Every day | ORAL | Status: DC
  Administered 2016-06-05: 8.6 mg via ORAL
  Filled 2016-06-05: qty 1

## 2016-06-05 NOTE — Progress Notes (Signed)
Pt IV removed, WNL. D/C instructions given to pt and family member. Daughter present to take home.

## 2016-06-05 NOTE — Care Management (Signed)
CM added HH SLP to Home health services ordered yesterday. Will notify Vaughan Basta of [facility].

## 2016-06-05 NOTE — Discharge Summary (Signed)
Physician Discharge Summary  April Page [name] DOB: Sep 25, 1945 DOA: 06/03/2016  PCP: April Herring, MD  Admit date: 06/03/2016 Discharge date: 06/05/2016  Admitted From: Home Disposition:  Home  Recommendations for Outpatient Follow-up:  1. Follow up with PCP in 1-2 weeks 2. Please obtain BMP/CBC in one week   Home Health: YES Equipment/Devices: PT/SLP  Discharge Condition: Stable CODE STATUS: FULL Diet recommendation: Heart Healthy  Brief/Interim Summary:  71 y.o. female with h/o HTN, MO, HLD here with slurred speech that the daughter noticed yesterday. For a few weeks she has also been "weak" and has been "moving more slowly". Labs WNL, CT head without acute disease, MRI brain IMPRESSION: 1. 7 mm acute/subacute nonhemorrhagic infarct involving the splenium of the corpus callosum on the left.  Discharge Diagnoses:  Acute ischemic stroke -Appreciate Neurology Consult -PT/OT evaluation-->home health PT -Speech therapy eval-->home SLP -CT brain--negative -MRI brain--acute/subacute infarct splenium of corpus callosum on the left -MRA brain--high-grade stenosis left PCA -Carotid Duplex--negative for hemodynamically significant stenosis -Echo--EF 60-65%, grade 1 DD -LDL--222 -HbA1C--6.4 -Antiplatelet--ASA 325 mg + Plavix 75 mg x 90 days, then ASA alone   Impaired glucose tolerance -Hemoglobin A1c 6.4 -Lifestyle modification  Hypertension -Antihypertensive medications initially held to allow for permissive hypertension -Resume losartan 100 mg daily  Hyperlipidemia -LDL 222 -Lipitor 40 mg daily started  CML -follow up onc -continue Nilotinib (tasigna) 300 mg po BID    Discharge Instructions  Discharge Instructions    Diet - low sodium heart healthy    Complete by:  As directed    Diet - low sodium heart healthy    Complete by:  As directed    Increase activity slowly    Complete by:  As directed    Increase activity slowly    Complete by:   As directed      Allergies as of 06/05/2016   No Known Allergies     Medication List    STOP taking these medications   lisinopril 10 MG tablet Commonly known as:  PRINIVIL,ZESTRIL     TAKE these medications   aspirin 325 MG tablet Take 1 tablet (325 mg total) by mouth daily.   atorvastatin 40 MG tablet Commonly known as:  LIPITOR Take 1 tablet (40 mg total) by mouth daily at 6 PM.   clopidogrel 75 MG tablet Commonly known as:  PLAVIX Take 1 tablet (75 mg total) by mouth daily.   COLACE PO Take 1 capsule by mouth at bedtime.   losartan 100 MG tablet Commonly known as:  COZAAR Take 1 tablet (100 mg total) by mouth daily. Resume on 3/28 What changed:  when to take this  additional instructions   multivitamin with minerals Tabs tablet Take 1 tablet by mouth daily.   nilotinib 150 MG capsule Commonly known as:  TASIGNA Take 2 capsules (300 mg total) by mouth every 12 (twelve) hours.   polyethylene glycol packet Commonly known as:  MIRALAX Take 17 g by mouth daily as needed for mild constipation.   polyethylene glycol packet Commonly known as:  MIRALAX / GLYCOLAX Take 17 g by mouth daily. Start taking on:  06/06/2016   senna 8.6 MG Tabs tablet Commonly known as:  SENOKOT Take 1 tablet (8.6 mg total) by mouth daily. Start taking on:  06/06/2016      Follow-up Information    DOONQUAH, KOFI, MD. Schedule an appointment as soon as possible for a visit in 1 month(s).   Specialty:  Neurology Contact information: [address] [place] [address] [phone]        April Herring, MD. Schedule an appointment as soon as possible for a visit in 2 weeks.   Specialty:  Internal Medicine Contact information: [address] [place] [address] [phone]          No Known Allergies  Consultations:  neurology   Procedures/Studies: Ct Head Wo Contrast  Result Date: 06/03/2016 CLINICAL DATA:  Slurred speech.  Weakness EXAM: CT  HEAD WITHOUT CONTRAST TECHNIQUE: Contiguous axial images were obtained from the base of the skull through the vertex without intravenous contrast. COMPARISON:  None. FINDINGS: Brain: Cerebral volume normal for age. Negative for hydrocephalus. Mild white matter ischemic changes appear chronic. Chronic infarct right internal capsule anteriorly Negative for acute infarct.  Negative for acute hemorrhage or mass. Vascular: No hyperdense vessel or unexpected calcification. Skull: No acute skeletal abnormality. 15 mm osteoma left parietal bone projecting into the scalp. This has a benign appearance. Sinuses/Orbits: Negative Other: None IMPRESSION: No acute intracranial abnormality. Chronic ischemic changes as above. Electronically Signed   By: Franchot Gallo M.D.   On: 06/03/2016 08:58   Mr Jodene Nam Head Wo Contrast  Result Date: 06/03/2016 CLINICAL DATA:  New onset of slurred speech with right upper and lower extremity weakness beginning yesterday morning. EXAM: MRA HEAD WITHOUT CONTRAST TECHNIQUE: Angiographic images of the Circle of Willis were obtained using MRA technique without intravenous contrast. COMPARISON:  MRI brain from the same day. FINDINGS: There is mild tortuosity of the cervical internal carotid arteries bilaterally. Mild narrowing of less than 50% is present within the precavernous right internal carotid artery. The right A1 segment is aplastic. The left A1 segment is dominant and normal. Both A2 segments fill. There is moderate stenosis in the mid left M2 segment. There is moderate to high-grade stenosis of the distal right M1 segment. There is significant attenuation of M2 branch vessels bilaterally. Proximal stenosis is noted in the left M2 branches. The left vertebral artery is the dominant vessel. The right PICA origin is visualized. The left PICA is not visualized. The basilar artery is normal. Both posterior cerebral arteries originate from basilar tip. There is a high-grade stenosis in the  proximal left posterior cerebral artery which may account for perforator vessels to the area of acute infarction. Moderate stenosis is present in the right P2 segment. IMPRESSION: 1. High-grade stenosis or occlusion of the left posterior cerebral artery proximally may account for the left corpus callosum infarct. 2. Moderate narrowing of the proximal right posterior cerebral artery. 3. Moderate to high-grade stenosis of the distal right M1 segment, just before the bifurcation. 4. Moderate stenoses of proximal left P2 segments at the left MCA bifurcation. 5. Moderate attenuation of MCA branch vessels, right worse than left. 6. Mild narrowing of the cavernous right internal carotid artery. Electronically Signed   By: San Morelle M.D.   On: 06/03/2016 11:04   Mr Brain Wo Contrast (neuro Protocol)  Result Date: 06/03/2016 CLINICAL DATA:  Slurred speech beginning yesterday morning. Right upper and lower extremity weakness. EXAM: MRI HEAD WITHOUT CONTRAST TECHNIQUE: Multiplanar, multiecho pulse sequences of the brain and surrounding structures were obtained without intravenous contrast. COMPARISON:  CT head without contrast from the same day. FINDINGS: Brain: A 7 mm focus of restricted diffusion is present within the splenium of the corpus callosum on the left. No other acute infarct is present. A remote infarct is present in the anterior right lentiform nucleus and internal capsule. There is mild  ex vacuo dilation of the adjacent ventricle. Remote lacunar infarcts are present within the thalami bilaterally. White matter changes extend into the brainstem. The cerebellum is unremarkable. Vascular: Flow is present in the major intracranial arteries. Skull and upper cervical spine: The skullbase is within normal limits. All midline sagittal structures are unremarkable. Craniocervical junction is within normal limits. Sinuses/Orbits: The paranasal sinuses and mastoid air cells are clear. The globes and orbits are  within normal limits. IMPRESSION: 1. 7 mm acute/subacute nonhemorrhagic infarct involving the splenium of the corpus callosum on the left. 2. Remote right basal ganglia infarct. 3. Remote infarcts of the thalami. 4. Moderate age advanced white matter disease bilaterally extends into the brainstem. Electronically Signed   By: San Morelle M.D.   On: 06/03/2016 10:59   US Carotid Bilateral (at [facility] Only)  Result Date: 06/04/2016 CLINICAL DATA:  CVA. EXAM: BILATERAL CAROTID DUPLEX ULTRASOUND TECHNIQUE: Pearline Cables scale imaging, color Doppler and duplex ultrasound were performed of bilateral carotid and vertebral arteries in the neck. COMPARISON:  MRI 06/03/2016. FINDINGS: Criteria: Quantification of carotid stenosis is based on velocity parameters that correlate the residual internal carotid diameter with NASCET-based stenosis levels, using the diameter of the distal internal carotid lumen as the denominator for stenosis measurement. The following velocity measurements were obtained: RIGHT ICA:  59/19 cm/sec CCA:  38/10 cm/sec SYSTOLIC ICA/CCA RATIO:  0.9 DIASTOLIC ICA/CCA RATIO:  1.6 ECA:  62 cm/sec LEFT ICA:  56/21 cm/sec CCA:  17/51 cm/sec SYSTOLIC ICA/CCA RATIO:  0.9 DIASTOLIC ICA/CCA RATIO:  1.3 ECA:  43 cm/sec RIGHT CAROTID ARTERY: Minimal carotid atherosclerotic vascular disease. No flow limiting stenosis. RIGHT VERTEBRAL ARTERY:  Patent with antegrade flow. LEFT CAROTID ARTERY: Minimal atherosclerotic vascular disease. No flow limiting stenosis. LEFT VERTEBRAL ARTERY:  Patent with antegrade flow. IMPRESSION: 1. Minimal bilateral carotid atherosclerotic vascular disease. No flow limiting stenosis. Degree of stenosis less than 50% bilaterally. 2. Vertebrals are patent with antegrade flow . Electronically Signed   By: Marcello Moores  Register   On: 06/04/2016 09:23         Discharge Exam: Vitals:   06/05/16 0611 06/05/16 1011  BP: (!) 165/85 (!) 151/90  Pulse: 72 71  Resp: 18 18  Temp: 98.5 F  (36.9 C) 98.9 F (37.2 C)   Vitals:   06/04/16 2211 06/05/16 0211 06/05/16 0611 06/05/16 1011  BP: (!) 166/73 (!) 175/76 (!) 165/85 (!) 151/90  Pulse: 65 71 72 71  Resp: 18 18 18 18   Temp: 98.6 F (37 C) 98.6 F (37 C) 98.5 F (36.9 C) 98.9 F (37.2 C)  TempSrc: Oral Oral Oral Oral  SpO2: 100% 100% 98% 100%  Weight:      Height:        General: Pt is alert, awake, not in acute distress Cardiovascular: RRR, S1/S2 +, no rubs, no gallops Respiratory: CTA bilaterally, no wheezing, no rhonchi Abdominal: Soft, NT, ND, bowel sounds + Extremities: no edema, no cyanosis   The results of significant diagnostics from this hospitalization (including imaging, microbiology, ancillary and laboratory) are listed below for reference.    Significant Diagnostic Studies: Ct Head Wo Contrast  Result Date: 06/03/2016 CLINICAL DATA:  Slurred speech.  Weakness EXAM: CT HEAD WITHOUT CONTRAST TECHNIQUE: Contiguous axial images were obtained from the base of the skull through the vertex without intravenous contrast. COMPARISON:  None. FINDINGS: Brain: Cerebral volume normal for age. Negative for hydrocephalus. Mild white matter ischemic changes appear chronic. Chronic infarct right internal capsule anteriorly Negative for acute  infarct.  Negative for acute hemorrhage or mass. Vascular: No hyperdense vessel or unexpected calcification. Skull: No acute skeletal abnormality. 15 mm osteoma left parietal bone projecting into the scalp. This has a benign appearance. Sinuses/Orbits: Negative Other: None IMPRESSION: No acute intracranial abnormality. Chronic ischemic changes as above. Electronically Signed   By: Franchot Gallo M.D.   On: 06/03/2016 08:58   Mr Jodene Nam Head Wo Contrast  Result Date: 06/03/2016 CLINICAL DATA:  New onset of slurred speech with right upper and lower extremity weakness beginning yesterday morning. EXAM: MRA HEAD WITHOUT CONTRAST TECHNIQUE: Angiographic images of the Circle of Willis were  obtained using MRA technique without intravenous contrast. COMPARISON:  MRI brain from the same day. FINDINGS: There is mild tortuosity of the cervical internal carotid arteries bilaterally. Mild narrowing of less than 50% is present within the precavernous right internal carotid artery. The right A1 segment is aplastic. The left A1 segment is dominant and normal. Both A2 segments fill. There is moderate stenosis in the mid left M2 segment. There is moderate to high-grade stenosis of the distal right M1 segment. There is significant attenuation of M2 branch vessels bilaterally. Proximal stenosis is noted in the left M2 branches. The left vertebral artery is the dominant vessel. The right PICA origin is visualized. The left PICA is not visualized. The basilar artery is normal. Both posterior cerebral arteries originate from basilar tip. There is a high-grade stenosis in the proximal left posterior cerebral artery which may account for perforator vessels to the area of acute infarction. Moderate stenosis is present in the right P2 segment. IMPRESSION: 1. High-grade stenosis or occlusion of the left posterior cerebral artery proximally may account for the left corpus callosum infarct. 2. Moderate narrowing of the proximal right posterior cerebral artery. 3. Moderate to high-grade stenosis of the distal right M1 segment, just before the bifurcation. 4. Moderate stenoses of proximal left P2 segments at the left MCA bifurcation. 5. Moderate attenuation of MCA branch vessels, right worse than left. 6. Mild narrowing of the cavernous right internal carotid artery. Electronically Signed   By: San Morelle M.D.   On: 06/03/2016 11:04   Mr Brain Wo Contrast (neuro Protocol)  Result Date: 06/03/2016 CLINICAL DATA:  Slurred speech beginning yesterday morning. Right upper and lower extremity weakness. EXAM: MRI HEAD WITHOUT CONTRAST TECHNIQUE: Multiplanar, multiecho pulse sequences of the brain and surrounding  structures were obtained without intravenous contrast. COMPARISON:  CT head without contrast from the same day. FINDINGS: Brain: A 7 mm focus of restricted diffusion is present within the splenium of the corpus callosum on the left. No other acute infarct is present. A remote infarct is present in the anterior right lentiform nucleus and internal capsule. There is mild ex vacuo dilation of the adjacent ventricle. Remote lacunar infarcts are present within the thalami bilaterally. White matter changes extend into the brainstem. The cerebellum is unremarkable. Vascular: Flow is present in the major intracranial arteries. Skull and upper cervical spine: The skullbase is within normal limits. All midline sagittal structures are unremarkable. Craniocervical junction is within normal limits. Sinuses/Orbits: The paranasal sinuses and mastoid air cells are clear. The globes and orbits are within normal limits. IMPRESSION: 1. 7 mm acute/subacute nonhemorrhagic infarct involving the splenium of the corpus callosum on the left. 2. Remote right basal ganglia infarct. 3. Remote infarcts of the thalami. 4. Moderate age advanced white matter disease bilaterally extends into the brainstem. Electronically Signed   By: San Morelle M.D.   On: 06/03/2016  10:59   US Carotid Bilateral (at [facility] And [facility] Only)  Result Date: 06/04/2016 CLINICAL DATA:  CVA. EXAM: BILATERAL CAROTID DUPLEX ULTRASOUND TECHNIQUE: Pearline Cables scale imaging, color Doppler and duplex ultrasound were performed of bilateral carotid and vertebral arteries in the neck. COMPARISON:  MRI 06/03/2016. FINDINGS: Criteria: Quantification of carotid stenosis is based on velocity parameters that correlate the residual internal carotid diameter with NASCET-based stenosis levels, using the diameter of the distal internal carotid lumen as the denominator for stenosis measurement. The following velocity measurements were obtained: RIGHT ICA:  59/19 cm/sec CCA:  10/21 cm/sec  SYSTOLIC ICA/CCA RATIO:  0.9 DIASTOLIC ICA/CCA RATIO:  1.6 ECA:  62 cm/sec LEFT ICA:  56/21 cm/sec CCA:  11/73 cm/sec SYSTOLIC ICA/CCA RATIO:  0.9 DIASTOLIC ICA/CCA RATIO:  1.3 ECA:  43 cm/sec RIGHT CAROTID ARTERY: Minimal carotid atherosclerotic vascular disease. No flow limiting stenosis. RIGHT VERTEBRAL ARTERY:  Patent with antegrade flow. LEFT CAROTID ARTERY: Minimal atherosclerotic vascular disease. No flow limiting stenosis. LEFT VERTEBRAL ARTERY:  Patent with antegrade flow. IMPRESSION: 1. Minimal bilateral carotid atherosclerotic vascular disease. No flow limiting stenosis. Degree of stenosis less than 50% bilaterally. 2. Vertebrals are patent with antegrade flow . Electronically Signed   By: Marcello Moores  Register   On: 06/04/2016 09:23     Microbiology: No results found for this or any previous visit (from the past 240 hour(s)).   Labs: Basic Metabolic Panel:  Recent Labs Lab 06/03/16 0827  NA 139  K 4.2  CL 102  CO2 28  GLUCOSE 147*  BUN 16  CREATININE 1.35*  CALCIUM 9.8   Liver Function Tests:  Recent Labs Lab 06/03/16 0827  AST 32  ALT 26  ALKPHOS 147*  BILITOT 1.0  PROT 7.8  ALBUMIN 4.0   No results for input(s): LIPASE, AMYLASE in the last 168 hours. No results for input(s): AMMONIA in the last 168 hours. CBC:  Recent Labs Lab 06/03/16 0827  WBC 7.4  NEUTROABS 4.5  HGB 12.4  HCT 39.4  MCV 86.2  PLT 315   Cardiac Enzymes:  Recent Labs Lab 06/03/16 0827  TROPONINI <0.03   BNP: Invalid input(s): POCBNP CBG: No results for input(s): GLUCAP in the last 168 hours.  Time coordinating discharge:  Greater than 30 minutes  Signed:  Tami Barren, DO [facility] Pager: [phone] 06/05/2016, 1:50 PM

## 2016-06-05 NOTE — Consult Note (Signed)
[place] Nurse wound consult note Reason for Consult: Recently admitted with slurred speech, decreased mobility and noted to have nonhemorrhagic infarct.  Is noted to require hands on assist with all ADLs and discharge disposition will likely be home with [facility] for therapy.  Was noted on admission to have  Erythematous, denuded skin to bilateral buttocks, consistent with moisture associated skin damage (MASD) from incontinence and decreased ability to care for herself.  Wound type:MASD Pressure Injury POA: Yes Measurement:Left upper buttocks at gluteal fold 1 cm x 1 cm x 0.1 cm pink moist denuded skin Right upper buttocks 1 cmx 0.5 cm intact erythema Wound PPG:FQMK and moist Drainage (amount, consistency, odor) scant serous Periwound:intact Dressing procedure/placement/frequency:Keep skin clean and dry.  Apply barrier cream twice daily and PRN soilage.  Encourage to turn and reposition every two hours.   Will not follow at this time.  Please re-consult if needed.  Domenic Moras RN BSN [place] Pager [phone]

## 2016-06-06 DIAGNOSIS — L89312 Pressure ulcer of right buttock, stage 2: Secondary | ICD-10-CM | POA: Diagnosis not present

## 2016-06-06 DIAGNOSIS — C9210 Chronic myeloid leukemia, BCR/ABL-positive, not having achieved remission: Secondary | ICD-10-CM | POA: Diagnosis not present

## 2016-06-06 DIAGNOSIS — S72141D Displaced intertrochanteric fracture of right femur, subsequent encounter for closed fracture with routine healing: Secondary | ICD-10-CM | POA: Diagnosis not present

## 2016-06-06 DIAGNOSIS — W19XXXD Unspecified fall, subsequent encounter: Secondary | ICD-10-CM | POA: Diagnosis not present

## 2016-06-06 DIAGNOSIS — L89322 Pressure ulcer of left buttock, stage 2: Secondary | ICD-10-CM | POA: Diagnosis not present

## 2016-06-06 DIAGNOSIS — E669 Obesity, unspecified: Secondary | ICD-10-CM | POA: Diagnosis not present

## 2016-06-06 DIAGNOSIS — E1165 Type 2 diabetes mellitus with hyperglycemia: Secondary | ICD-10-CM | POA: Diagnosis not present

## 2016-06-06 DIAGNOSIS — I10 Essential (primary) hypertension: Secondary | ICD-10-CM | POA: Diagnosis not present

## 2016-06-06 DIAGNOSIS — S72121D Displaced fracture of lesser trochanter of right femur, subsequent encounter for closed fracture with routine healing: Secondary | ICD-10-CM | POA: Diagnosis not present

## 2016-06-10 ENCOUNTER — Telehealth (HOSPITAL_COMMUNITY): Payer: Self-pay | Admitting: Internal Medicine

## 2016-06-10 DIAGNOSIS — E1165 Type 2 diabetes mellitus with hyperglycemia: Secondary | ICD-10-CM | POA: Diagnosis not present

## 2016-06-10 DIAGNOSIS — L89322 Pressure ulcer of left buttock, stage 2: Secondary | ICD-10-CM | POA: Diagnosis not present

## 2016-06-10 DIAGNOSIS — S72121D Displaced fracture of lesser trochanter of right femur, subsequent encounter for closed fracture with routine healing: Secondary | ICD-10-CM | POA: Diagnosis not present

## 2016-06-10 DIAGNOSIS — W19XXXD Unspecified fall, subsequent encounter: Secondary | ICD-10-CM | POA: Diagnosis not present

## 2016-06-10 DIAGNOSIS — C9210 Chronic myeloid leukemia, BCR/ABL-positive, not having achieved remission: Secondary | ICD-10-CM | POA: Diagnosis not present

## 2016-06-10 DIAGNOSIS — I10 Essential (primary) hypertension: Secondary | ICD-10-CM | POA: Diagnosis not present

## 2016-06-10 DIAGNOSIS — L89312 Pressure ulcer of right buttock, stage 2: Secondary | ICD-10-CM | POA: Diagnosis not present

## 2016-06-10 DIAGNOSIS — E669 Obesity, unspecified: Secondary | ICD-10-CM | POA: Diagnosis not present

## 2016-06-10 DIAGNOSIS — S72141D Displaced intertrochanteric fracture of right femur, subsequent encounter for closed fracture with routine healing: Secondary | ICD-10-CM | POA: Diagnosis not present

## 2016-06-10 NOTE — Telephone Encounter (Signed)
06/10/16 called and left a message to reschedule appt on 4/11 at 4pm.  Kristin's schedule is marked for an event at the hospital so this appt time needs to be changed or change the date.

## 2016-06-11 DIAGNOSIS — S72141D Displaced intertrochanteric fracture of right femur, subsequent encounter for closed fracture with routine healing: Secondary | ICD-10-CM | POA: Diagnosis not present

## 2016-06-11 DIAGNOSIS — E1165 Type 2 diabetes mellitus with hyperglycemia: Secondary | ICD-10-CM | POA: Diagnosis not present

## 2016-06-11 DIAGNOSIS — C9210 Chronic myeloid leukemia, BCR/ABL-positive, not having achieved remission: Secondary | ICD-10-CM | POA: Diagnosis not present

## 2016-06-11 DIAGNOSIS — L89312 Pressure ulcer of right buttock, stage 2: Secondary | ICD-10-CM | POA: Diagnosis not present

## 2016-06-11 DIAGNOSIS — W19XXXD Unspecified fall, subsequent encounter: Secondary | ICD-10-CM | POA: Diagnosis not present

## 2016-06-11 DIAGNOSIS — S72121D Displaced fracture of lesser trochanter of right femur, subsequent encounter for closed fracture with routine healing: Secondary | ICD-10-CM | POA: Diagnosis not present

## 2016-06-11 DIAGNOSIS — I10 Essential (primary) hypertension: Secondary | ICD-10-CM | POA: Diagnosis not present

## 2016-06-11 DIAGNOSIS — E669 Obesity, unspecified: Secondary | ICD-10-CM | POA: Diagnosis not present

## 2016-06-11 DIAGNOSIS — L89322 Pressure ulcer of left buttock, stage 2: Secondary | ICD-10-CM | POA: Diagnosis not present

## 2016-06-13 DIAGNOSIS — S72141D Displaced intertrochanteric fracture of right femur, subsequent encounter for closed fracture with routine healing: Secondary | ICD-10-CM | POA: Diagnosis not present

## 2016-06-13 DIAGNOSIS — Z6825 Body mass index (BMI) 25.0-25.9, adult: Secondary | ICD-10-CM | POA: Diagnosis not present

## 2016-06-13 DIAGNOSIS — L89322 Pressure ulcer of left buttock, stage 2: Secondary | ICD-10-CM | POA: Diagnosis not present

## 2016-06-13 DIAGNOSIS — E119 Type 2 diabetes mellitus without complications: Secondary | ICD-10-CM | POA: Diagnosis not present

## 2016-06-13 DIAGNOSIS — R1310 Dysphagia, unspecified: Secondary | ICD-10-CM | POA: Diagnosis not present

## 2016-06-13 DIAGNOSIS — C9210 Chronic myeloid leukemia, BCR/ABL-positive, not having achieved remission: Secondary | ICD-10-CM | POA: Diagnosis not present

## 2016-06-13 DIAGNOSIS — I10 Essential (primary) hypertension: Secondary | ICD-10-CM | POA: Diagnosis not present

## 2016-06-13 DIAGNOSIS — E663 Overweight: Secondary | ICD-10-CM | POA: Diagnosis not present

## 2016-06-13 DIAGNOSIS — W19XXXD Unspecified fall, subsequent encounter: Secondary | ICD-10-CM | POA: Diagnosis not present

## 2016-06-13 DIAGNOSIS — E1165 Type 2 diabetes mellitus with hyperglycemia: Secondary | ICD-10-CM | POA: Diagnosis not present

## 2016-06-13 DIAGNOSIS — E669 Obesity, unspecified: Secondary | ICD-10-CM | POA: Diagnosis not present

## 2016-06-13 DIAGNOSIS — L89312 Pressure ulcer of right buttock, stage 2: Secondary | ICD-10-CM | POA: Diagnosis not present

## 2016-06-13 DIAGNOSIS — I639 Cerebral infarction, unspecified: Secondary | ICD-10-CM | POA: Diagnosis not present

## 2016-06-13 DIAGNOSIS — S72121D Displaced fracture of lesser trochanter of right femur, subsequent encounter for closed fracture with routine healing: Secondary | ICD-10-CM | POA: Diagnosis not present

## 2016-06-13 DIAGNOSIS — R634 Abnormal weight loss: Secondary | ICD-10-CM | POA: Diagnosis not present

## 2016-06-14 ENCOUNTER — Encounter (HOSPITAL_COMMUNITY): Payer: Medicare HMO | Attending: Adult Health

## 2016-06-14 DIAGNOSIS — E669 Obesity, unspecified: Secondary | ICD-10-CM | POA: Diagnosis not present

## 2016-06-14 DIAGNOSIS — C9211 Chronic myeloid leukemia, BCR/ABL-positive, in remission: Secondary | ICD-10-CM | POA: Insufficient documentation

## 2016-06-14 DIAGNOSIS — S72141D Displaced intertrochanteric fracture of right femur, subsequent encounter for closed fracture with routine healing: Secondary | ICD-10-CM | POA: Diagnosis not present

## 2016-06-14 DIAGNOSIS — I10 Essential (primary) hypertension: Secondary | ICD-10-CM | POA: Diagnosis not present

## 2016-06-14 DIAGNOSIS — W19XXXD Unspecified fall, subsequent encounter: Secondary | ICD-10-CM | POA: Diagnosis not present

## 2016-06-14 DIAGNOSIS — C9210 Chronic myeloid leukemia, BCR/ABL-positive, not having achieved remission: Secondary | ICD-10-CM | POA: Diagnosis not present

## 2016-06-14 DIAGNOSIS — E1165 Type 2 diabetes mellitus with hyperglycemia: Secondary | ICD-10-CM | POA: Diagnosis not present

## 2016-06-14 DIAGNOSIS — L89322 Pressure ulcer of left buttock, stage 2: Secondary | ICD-10-CM | POA: Diagnosis not present

## 2016-06-14 DIAGNOSIS — S72121D Displaced fracture of lesser trochanter of right femur, subsequent encounter for closed fracture with routine healing: Secondary | ICD-10-CM | POA: Diagnosis not present

## 2016-06-14 DIAGNOSIS — L89312 Pressure ulcer of right buttock, stage 2: Secondary | ICD-10-CM | POA: Diagnosis not present

## 2016-06-14 LAB — COMPREHENSIVE METABOLIC PANEL
ALK PHOS: 161 U/L — AB (ref 38–126)
ALT: 34 U/L (ref 14–54)
AST: 36 U/L (ref 15–41)
Albumin: 4.1 g/dL (ref 3.5–5.0)
Anion gap: 10 (ref 5–15)
BILIRUBIN TOTAL: 0.9 mg/dL (ref 0.3–1.2)
BUN: 17 mg/dL (ref 6–20)
CALCIUM: 10.1 mg/dL (ref 8.9–10.3)
CO2: 26 mmol/L (ref 22–32)
CREATININE: 1.24 mg/dL — AB (ref 0.44–1.00)
Chloride: 104 mmol/L (ref 101–111)
GFR calc non Af Amer: 43 mL/min — ABNORMAL LOW (ref >60)
GFR, EST AFRICAN AMERICAN: 49 mL/min — AB (ref >60)
GLUCOSE: 128 mg/dL — AB (ref 65–99)
Potassium: 4.2 mmol/L (ref 3.5–5.1)
Sodium: 140 mmol/L (ref 135–145)
TOTAL PROTEIN: 7.9 g/dL (ref 6.5–8.1)

## 2016-06-15 DIAGNOSIS — I69322 Dysarthria following cerebral infarction: Secondary | ICD-10-CM | POA: Diagnosis not present

## 2016-06-15 DIAGNOSIS — W19XXXD Unspecified fall, subsequent encounter: Secondary | ICD-10-CM | POA: Diagnosis not present

## 2016-06-15 DIAGNOSIS — E669 Obesity, unspecified: Secondary | ICD-10-CM | POA: Diagnosis not present

## 2016-06-15 DIAGNOSIS — I10 Essential (primary) hypertension: Secondary | ICD-10-CM | POA: Diagnosis not present

## 2016-06-15 DIAGNOSIS — S72141D Displaced intertrochanteric fracture of right femur, subsequent encounter for closed fracture with routine healing: Secondary | ICD-10-CM | POA: Diagnosis not present

## 2016-06-15 DIAGNOSIS — S72121D Displaced fracture of lesser trochanter of right femur, subsequent encounter for closed fracture with routine healing: Secondary | ICD-10-CM | POA: Diagnosis not present

## 2016-06-15 DIAGNOSIS — C9210 Chronic myeloid leukemia, BCR/ABL-positive, not having achieved remission: Secondary | ICD-10-CM | POA: Diagnosis not present

## 2016-06-15 DIAGNOSIS — E119 Type 2 diabetes mellitus without complications: Secondary | ICD-10-CM | POA: Diagnosis not present

## 2016-06-15 DIAGNOSIS — L89312 Pressure ulcer of right buttock, stage 2: Secondary | ICD-10-CM | POA: Diagnosis not present

## 2016-06-18 ENCOUNTER — Ambulatory Visit: Payer: Medicare HMO | Admitting: Nurse Practitioner

## 2016-06-18 DIAGNOSIS — W19XXXD Unspecified fall, subsequent encounter: Secondary | ICD-10-CM | POA: Diagnosis not present

## 2016-06-18 DIAGNOSIS — E119 Type 2 diabetes mellitus without complications: Secondary | ICD-10-CM | POA: Diagnosis not present

## 2016-06-18 DIAGNOSIS — C9210 Chronic myeloid leukemia, BCR/ABL-positive, not having achieved remission: Secondary | ICD-10-CM | POA: Diagnosis not present

## 2016-06-18 DIAGNOSIS — I10 Essential (primary) hypertension: Secondary | ICD-10-CM | POA: Diagnosis not present

## 2016-06-18 DIAGNOSIS — S72141D Displaced intertrochanteric fracture of right femur, subsequent encounter for closed fracture with routine healing: Secondary | ICD-10-CM | POA: Diagnosis not present

## 2016-06-18 DIAGNOSIS — I69322 Dysarthria following cerebral infarction: Secondary | ICD-10-CM | POA: Diagnosis not present

## 2016-06-18 DIAGNOSIS — E669 Obesity, unspecified: Secondary | ICD-10-CM | POA: Diagnosis not present

## 2016-06-18 DIAGNOSIS — L89312 Pressure ulcer of right buttock, stage 2: Secondary | ICD-10-CM | POA: Diagnosis not present

## 2016-06-18 DIAGNOSIS — S72121D Displaced fracture of lesser trochanter of right femur, subsequent encounter for closed fracture with routine healing: Secondary | ICD-10-CM | POA: Diagnosis not present

## 2016-06-19 ENCOUNTER — Encounter (HOSPITAL_COMMUNITY): Payer: Self-pay

## 2016-06-19 ENCOUNTER — Other Ambulatory Visit (HOSPITAL_COMMUNITY): Payer: Self-pay | Admitting: Oncology

## 2016-06-19 ENCOUNTER — Ambulatory Visit (HOSPITAL_COMMUNITY): Payer: Medicare HMO | Admitting: Physical Therapy

## 2016-06-19 DIAGNOSIS — Z79899 Other long term (current) drug therapy: Secondary | ICD-10-CM

## 2016-06-19 DIAGNOSIS — C9210 Chronic myeloid leukemia, BCR/ABL-positive, not having achieved remission: Secondary | ICD-10-CM

## 2016-06-19 MED ORDER — NILOTINIB HCL 150 MG PO CAPS: 300.0000 mg | ORAL_CAPSULE | Freq: Two times a day (BID) | ORAL | 6 refills | Status: DC

## 2016-06-20 DIAGNOSIS — I69322 Dysarthria following cerebral infarction: Secondary | ICD-10-CM | POA: Diagnosis not present

## 2016-06-20 DIAGNOSIS — I10 Essential (primary) hypertension: Secondary | ICD-10-CM | POA: Diagnosis not present

## 2016-06-20 DIAGNOSIS — E669 Obesity, unspecified: Secondary | ICD-10-CM | POA: Diagnosis not present

## 2016-06-20 DIAGNOSIS — S72141D Displaced intertrochanteric fracture of right femur, subsequent encounter for closed fracture with routine healing: Secondary | ICD-10-CM | POA: Diagnosis not present

## 2016-06-20 DIAGNOSIS — C9210 Chronic myeloid leukemia, BCR/ABL-positive, not having achieved remission: Secondary | ICD-10-CM | POA: Diagnosis not present

## 2016-06-20 DIAGNOSIS — W19XXXD Unspecified fall, subsequent encounter: Secondary | ICD-10-CM | POA: Diagnosis not present

## 2016-06-20 DIAGNOSIS — E119 Type 2 diabetes mellitus without complications: Secondary | ICD-10-CM | POA: Diagnosis not present

## 2016-06-20 DIAGNOSIS — S72121D Displaced fracture of lesser trochanter of right femur, subsequent encounter for closed fracture with routine healing: Secondary | ICD-10-CM | POA: Diagnosis not present

## 2016-06-20 DIAGNOSIS — L89312 Pressure ulcer of right buttock, stage 2: Secondary | ICD-10-CM | POA: Diagnosis not present

## 2016-06-21 DIAGNOSIS — E669 Obesity, unspecified: Secondary | ICD-10-CM | POA: Diagnosis not present

## 2016-06-21 DIAGNOSIS — W19XXXD Unspecified fall, subsequent encounter: Secondary | ICD-10-CM | POA: Diagnosis not present

## 2016-06-21 DIAGNOSIS — L89312 Pressure ulcer of right buttock, stage 2: Secondary | ICD-10-CM | POA: Diagnosis not present

## 2016-06-21 DIAGNOSIS — S72121D Displaced fracture of lesser trochanter of right femur, subsequent encounter for closed fracture with routine healing: Secondary | ICD-10-CM | POA: Diagnosis not present

## 2016-06-21 DIAGNOSIS — I10 Essential (primary) hypertension: Secondary | ICD-10-CM | POA: Diagnosis not present

## 2016-06-21 DIAGNOSIS — C9210 Chronic myeloid leukemia, BCR/ABL-positive, not having achieved remission: Secondary | ICD-10-CM | POA: Diagnosis not present

## 2016-06-21 DIAGNOSIS — E119 Type 2 diabetes mellitus without complications: Secondary | ICD-10-CM | POA: Diagnosis not present

## 2016-06-21 DIAGNOSIS — I69322 Dysarthria following cerebral infarction: Secondary | ICD-10-CM | POA: Diagnosis not present

## 2016-06-21 DIAGNOSIS — S72141D Displaced intertrochanteric fracture of right femur, subsequent encounter for closed fracture with routine healing: Secondary | ICD-10-CM | POA: Diagnosis not present

## 2016-06-25 DIAGNOSIS — S72141D Displaced intertrochanteric fracture of right femur, subsequent encounter for closed fracture with routine healing: Secondary | ICD-10-CM | POA: Diagnosis not present

## 2016-06-25 DIAGNOSIS — S72121D Displaced fracture of lesser trochanter of right femur, subsequent encounter for closed fracture with routine healing: Secondary | ICD-10-CM | POA: Diagnosis not present

## 2016-06-25 DIAGNOSIS — I69322 Dysarthria following cerebral infarction: Secondary | ICD-10-CM | POA: Diagnosis not present

## 2016-06-25 DIAGNOSIS — I10 Essential (primary) hypertension: Secondary | ICD-10-CM | POA: Diagnosis not present

## 2016-06-25 DIAGNOSIS — E119 Type 2 diabetes mellitus without complications: Secondary | ICD-10-CM | POA: Diagnosis not present

## 2016-06-25 DIAGNOSIS — E669 Obesity, unspecified: Secondary | ICD-10-CM | POA: Diagnosis not present

## 2016-06-25 DIAGNOSIS — W19XXXD Unspecified fall, subsequent encounter: Secondary | ICD-10-CM | POA: Diagnosis not present

## 2016-06-25 DIAGNOSIS — L89312 Pressure ulcer of right buttock, stage 2: Secondary | ICD-10-CM | POA: Diagnosis not present

## 2016-06-25 DIAGNOSIS — C9210 Chronic myeloid leukemia, BCR/ABL-positive, not having achieved remission: Secondary | ICD-10-CM | POA: Diagnosis not present

## 2016-06-27 DIAGNOSIS — I69322 Dysarthria following cerebral infarction: Secondary | ICD-10-CM | POA: Diagnosis not present

## 2016-06-27 DIAGNOSIS — S72121D Displaced fracture of lesser trochanter of right femur, subsequent encounter for closed fracture with routine healing: Secondary | ICD-10-CM | POA: Diagnosis not present

## 2016-06-27 DIAGNOSIS — C9210 Chronic myeloid leukemia, BCR/ABL-positive, not having achieved remission: Secondary | ICD-10-CM | POA: Diagnosis not present

## 2016-06-27 DIAGNOSIS — E669 Obesity, unspecified: Secondary | ICD-10-CM | POA: Diagnosis not present

## 2016-06-27 DIAGNOSIS — S72141D Displaced intertrochanteric fracture of right femur, subsequent encounter for closed fracture with routine healing: Secondary | ICD-10-CM | POA: Diagnosis not present

## 2016-06-27 DIAGNOSIS — L89312 Pressure ulcer of right buttock, stage 2: Secondary | ICD-10-CM | POA: Diagnosis not present

## 2016-06-27 DIAGNOSIS — E119 Type 2 diabetes mellitus without complications: Secondary | ICD-10-CM | POA: Diagnosis not present

## 2016-06-27 DIAGNOSIS — W19XXXD Unspecified fall, subsequent encounter: Secondary | ICD-10-CM | POA: Diagnosis not present

## 2016-06-27 DIAGNOSIS — I10 Essential (primary) hypertension: Secondary | ICD-10-CM | POA: Diagnosis not present

## 2016-06-28 ENCOUNTER — Telehealth (HOSPITAL_COMMUNITY): Payer: Self-pay

## 2016-06-28 ENCOUNTER — Ambulatory Visit (HOSPITAL_COMMUNITY)
Admission: RE | Admit: 2016-06-28 | Discharge: 2016-06-28 | Disposition: A | Discharge status: Home / Self Care | Payer: Medicare HMO | Source: Ambulatory Visit | Attending: Family Medicine | Admitting: Family Medicine

## 2016-06-28 ENCOUNTER — Encounter (HOSPITAL_COMMUNITY): Payer: Medicare HMO

## 2016-06-28 ENCOUNTER — Other Ambulatory Visit (HOSPITAL_COMMUNITY): Payer: Self-pay | Admitting: Family Medicine

## 2016-06-28 DIAGNOSIS — R599 Enlarged lymph nodes, unspecified: Secondary | ICD-10-CM | POA: Diagnosis not present

## 2016-06-28 DIAGNOSIS — I700 Atherosclerosis of aorta: Secondary | ICD-10-CM | POA: Insufficient documentation

## 2016-06-28 DIAGNOSIS — R0781 Pleurodynia: Secondary | ICD-10-CM | POA: Diagnosis not present

## 2016-06-28 DIAGNOSIS — R079 Chest pain, unspecified: Secondary | ICD-10-CM | POA: Diagnosis not present

## 2016-06-28 DIAGNOSIS — R2233 Localized swelling, mass and lump, upper limb, bilateral: Secondary | ICD-10-CM | POA: Diagnosis not present

## 2016-06-28 NOTE — Telephone Encounter (Signed)
Patient stopped by cancer center, after nutrition appointment, accompanied by 2 daughters. The daughters were concerned about an area on patients right upper back and behind her right ear. Both places are swollen and hard. They are not red, warm, or painful. Patient denies fever or falls. Patient states she did not know these hard swollen areas were on her until one of her daughters pointed it out. Reviewed with NP. She does not think this is being caused by her CML or her Tasigna. NP recommends following up with PCP. explained recommendations to daughters and patient with understanding verbalized.

## 2016-06-28 NOTE — Progress Notes (Signed)
Nutrition Assessment   Reason for Assessment:   MD referral for weight loss.  ASSESSMENT:  71 year old female with chronic myelogenous leukemia taking Tasign daily.  Recent admission for stroke (discharged on 3/28).  Past medical history of HTN, GERD, DM, chronic diarrhea, broken hip 04/2016, HLD  Patient seen in clinic today with 2 daughters.  Patient reports decrease in appetite after having to eat a low sodium heart healthy diet.  Patient currently living with one of her daughters and daughter is preparing meals.  Patient reports that food does not taste good and she is not going to eat it.  Patient reports that typically has egg and [place] bacon for breakfast, for lunch meat sandwich and chips and for dinner chicken and vegetables.  Also eats fruits and yogurt.  Drinks ensure clear 1 per day.  Does not like the milky supplements.    Daughter reports that she chews and chews the food but usually ends up spitting it out.  Patient reports it does not taste good.  SLP was visit patient in the home but has recently signed off as well as PT.    No other nutrition issues reported.   Nutrition Focused Physical Exam: deferred at this time  Medications: colace, MVI, miralax, senokot  Labs: glucose 128 (4/6)  Anthropometrics:   Height: 66 inches Weight: 153 lb UBW: 170 lb  Noted 7% weight loss in the last 4 months, not significant BMI: 24  Weight loss has been before diagnosis of stroke and low sodium diet restrictions.  Estimated Energy Needs  Kcals: 1700-2100 calories/d Protein: 83 g/d Fluid: 2.1 L/d  NUTRITION DIAGNOSIS: Inadequate oral intake related to poor appetite as evidenced by 7% weight loss in the last 4 months.  MALNUTRITION DIAGNOSIS: will continue to assess   INTERVENTION:   Encouraged liberalization of low sodium diet restrictions to prevent further weight loss and lead to malnutrition.   Discussed ways to increase calories and protein and cooking techniques that  are lower in sodium to try with patient.   Encouraged daughters to contact SLP if concerns patient is having difficulty swallowing (coughing, choking, etc).    Encouraged intake of ensure clear/boost breeze supplements for added nutrition as patient does not like milky supplements.  Coupons given.  MONITORING, EVALUATION, GOAL: Patient will consume adequate calories and protein to meet nutritional needs and maintain weight   NEXT VISIT:  As needed  Tedi Hughson B. Zenia Resides, [place], [place] Registered Dietitian [phone] (pager)

## 2016-07-03 DIAGNOSIS — I10 Essential (primary) hypertension: Secondary | ICD-10-CM | POA: Diagnosis not present

## 2016-07-03 DIAGNOSIS — E119 Type 2 diabetes mellitus without complications: Secondary | ICD-10-CM | POA: Diagnosis not present

## 2016-07-03 DIAGNOSIS — R270 Ataxia, unspecified: Secondary | ICD-10-CM | POA: Diagnosis not present

## 2016-07-03 DIAGNOSIS — D171 Benign lipomatous neoplasm of skin and subcutaneous tissue of trunk: Secondary | ICD-10-CM | POA: Diagnosis not present

## 2016-07-03 DIAGNOSIS — R222 Localized swelling, mass and lump, trunk: Secondary | ICD-10-CM | POA: Diagnosis not present

## 2016-07-03 DIAGNOSIS — R471 Dysarthria and anarthria: Secondary | ICD-10-CM | POA: Diagnosis not present

## 2016-07-03 DIAGNOSIS — Z6824 Body mass index (BMI) 24.0-24.9, adult: Secondary | ICD-10-CM | POA: Diagnosis not present

## 2016-07-03 DIAGNOSIS — E089 Diabetes mellitus due to underlying condition without complications: Secondary | ICD-10-CM | POA: Diagnosis not present

## 2016-07-03 DIAGNOSIS — I63332 Cerebral infarction due to thrombosis of left posterior cerebral artery: Secondary | ICD-10-CM | POA: Diagnosis not present

## 2016-07-04 DIAGNOSIS — I10 Essential (primary) hypertension: Secondary | ICD-10-CM | POA: Diagnosis not present

## 2016-07-04 DIAGNOSIS — E669 Obesity, unspecified: Secondary | ICD-10-CM | POA: Diagnosis not present

## 2016-07-04 DIAGNOSIS — S72141D Displaced intertrochanteric fracture of right femur, subsequent encounter for closed fracture with routine healing: Secondary | ICD-10-CM | POA: Diagnosis not present

## 2016-07-04 DIAGNOSIS — C9210 Chronic myeloid leukemia, BCR/ABL-positive, not having achieved remission: Secondary | ICD-10-CM | POA: Diagnosis not present

## 2016-07-04 DIAGNOSIS — S72121D Displaced fracture of lesser trochanter of right femur, subsequent encounter for closed fracture with routine healing: Secondary | ICD-10-CM | POA: Diagnosis not present

## 2016-07-04 DIAGNOSIS — I69322 Dysarthria following cerebral infarction: Secondary | ICD-10-CM | POA: Diagnosis not present

## 2016-07-04 DIAGNOSIS — E119 Type 2 diabetes mellitus without complications: Secondary | ICD-10-CM | POA: Diagnosis not present

## 2016-07-04 DIAGNOSIS — W19XXXD Unspecified fall, subsequent encounter: Secondary | ICD-10-CM | POA: Diagnosis not present

## 2016-07-04 DIAGNOSIS — L89312 Pressure ulcer of right buttock, stage 2: Secondary | ICD-10-CM | POA: Diagnosis not present

## 2016-07-10 ENCOUNTER — Encounter: Payer: Self-pay | Admitting: Nurse Practitioner

## 2016-07-10 ENCOUNTER — Ambulatory Visit (INDEPENDENT_AMBULATORY_CARE_PROVIDER_SITE_OTHER): Payer: Medicare HMO | Admitting: Nurse Practitioner

## 2016-07-10 DIAGNOSIS — K5900 Constipation, unspecified: Secondary | ICD-10-CM | POA: Diagnosis not present

## 2016-07-10 DIAGNOSIS — R1310 Dysphagia, unspecified: Secondary | ICD-10-CM

## 2016-07-10 DIAGNOSIS — S72141D Displaced intertrochanteric fracture of right femur, subsequent encounter for closed fracture with routine healing: Secondary | ICD-10-CM | POA: Diagnosis not present

## 2016-07-10 DIAGNOSIS — R634 Abnormal weight loss: Secondary | ICD-10-CM | POA: Diagnosis not present

## 2016-07-10 DIAGNOSIS — I69391 Dysphagia following cerebral infarction: Secondary | ICD-10-CM | POA: Insufficient documentation

## 2016-07-10 NOTE — Assessment & Plan Note (Signed)
The patient described constipation which she states is since resolved. She confirmed on 2-3 occasions that she has a bowel movement about once every 2-3 weeks. However, her stools are soft and passing easily without straining. No blood or other red flag/warning signs or symptoms related to constipation. I reviewed with the patient and her daughter and is seen she has a decreased oral intake since her back-to-back hospitalizations for hip fracture/surgical repair and CVA. She has see nutrition who is aware of her weight loss and has had aid recommendations. I make further recommendations related to supplementing her by mouth intake with an sure, boost, and the like. I encouraged her to gradually increase her oral intake in order to help stave off further weight loss. Continue taking MiraLAX as needed. Call if any worsening symptoms. Return for follow-up in 2 months.

## 2016-07-10 NOTE — Patient Instructions (Signed)
1. Continue to slowly increase the amount of food you eat. 2. Continue using supplements for nutrition as the nutritionist discussed with you. 3. As we discussed you can also use milk-based supplements that she can put in a blender with ice cream to make a supplement milkshake. 4. Use MiraLAX as needed for constipation. 5. Return for follow-up in 2 months. 6. Call us if you have any worsening symptoms and need to be seen before then.

## 2016-07-10 NOTE — Progress Notes (Signed)
Primary Care Physician:  [name], MD Primary Gastroenterologist:  Dr. Gala Romney  Chief Complaint  Patient presents with  . Dysphagia  . Constipation    HPI:   April Page is a 71 y.o. female who presents on referral from primary care for dysphagia symptoms. Per our records patient is never had an upper endoscopy. She does have a history of GERD, CML PCP notes reviewed. Last saw her primary care 04/22/2016 at which point dysphagia was noted in barium swallow ordered. Exam completed for report 23rd 2018 which found diffuse age-related esophageal dysmotility, otherwise negative exam.  Today she states she;'s doing well overall. Her dysphagia synmptos were a while ago and "is all right now." Essentially resolved several weeks ago. Debnies GERD symptoms. She was having some constipation, started MiraLAX which resolved her constipation. She takes it as needed and hasn't taken it for a while. Has a bowel movement about every 3 weeks (she confirmed this again). Hasn't had constipation chronically although states it's been a problem for likely years. Denies abdominal pain, N/V. Denies hard stools; states htey're soft and pass easily consistent with [place] 4, no straining. Denies fever, chills. Has lost about 75 lbs in the last year. Objectively she weight 177 lb in June 2017 and is 145 lb today. Last colonoscopy was 2015 with a single polyp removed and found to be benign and benign leiomyoma. No recommendation for repeat exam. Denies chest pain, dyspnea, dizziness, lightheadedness, syncope, near syncope. Denies any other upper or lower GI symptoms.  She did have a hip fracture in 04/2016 as well as CVA end of March 2018 and since then is within 5 lbs of her discharge weight.  Past Medical History:  Diagnosis Date  . Arthritis    stiff knees  . Blood dyscrasia   . Cancer ([place])   . CVA (cerebral vascular accident) ([place])   . GERD (gastroesophageal reflux disease)   . H/O: CML (chronic  myeloid leukemia)   . Hyperlipidemia   . Hypertension   . Obesity   . Pessary maintenance 07/01/2013    Past Surgical History:  Procedure Laterality Date  . ABDOMINAL HYSTERECTOMY    . CHOLECYSTECTOMY    . COLONOSCOPY  06/09/2002   SFK:CLEXNTZG hemorrhoids; otherwise normal rectum, colon   . COLONOSCOPY N/A 08/25/2013   YFV:CBSWHQP diverticulosis. Single colonic polyp-removed  s/p segmental biopsy and stool sample. random colon bx negative. +benign leiomyoma.  . cystocele/rectocele repair  2009  . ESOPHAGOGASTRODUODENOSCOPY  06/09/2002   RFF:MBWGYK upper gastrointestinal tract s/p  54-French Maloney dilator  . FEMUR IM NAIL Right 04/12/2016   Procedure: INTRAMEDULLARY (IM) NAIL FEMORAL;  Surgeon: Nicholes Stairs, MD;  Location: [facility];  Service: Orthopedics;  Laterality: Right;  . HERNIA REPAIR    . OOPHORECTOMY      Current Outpatient Prescriptions  Medication Sig Dispense Refill  . aspirin 325 MG tablet Take 1 tablet (325 mg total) by mouth daily. 30 tablet 4  . atorvastatin (LIPITOR) 40 MG tablet Take 1 tablet (40 mg total) by mouth daily at 6 PM. 30 tablet 1  . clopidogrel (PLAVIX) 75 MG tablet Take 1 tablet (75 mg total) by mouth daily. 30 tablet 2  . losartan (COZAAR) 100 MG tablet Take 1 tablet (100 mg total) by mouth daily. Resume on 3/28 30 tablet 0  . Multiple Vitamin (MULTIVITAMIN WITH MINERALS) TABS tablet Take 1 tablet by mouth daily.    . nilotinib (TASIGNA) 150 MG capsule Take 2 capsules (300  mg total) by mouth every 12 (twelve) hours. 120 capsule 6  . polyethylene glycol (MIRALAX / GLYCOLAX) packet Take 17 g by mouth daily. 14 each 0   No current facility-administered medications for this visit.     Allergies as of 07/10/2016  . (No Known Allergies)    Family History  Problem Relation Age of Onset  . Anuerysm Mother     deceased age 25, brain anuerysm  . Early death Mother 55  . Heart disease Father   . Hypertension Sister   . Obesity Brother   .  Hypertension Sister   . Arthritis Sister   . Other Son     cardiac arrest  . Heart disease Child 23    cardiac arrest  . Colon cancer Neg Hx     Social History   Social History  . Marital status: Widowed    Spouse name: N/A  . Number of children: 6  . Years of education: associate   Occupational History  .  Retired   Social History Main Topics  . Smoking status: Never Smoker  . Smokeless tobacco: Never Used  . Alcohol use No  . Drug use: No  . Sexual activity: Not Currently    Birth control/ protection: Surgical     Comment: hyst   Other Topics Concern  . Not on file   Social History Narrative  . No narrative on file    Review of Systems: General: Negative for anorexia, weight loss, fever, chills. ENT: Negative for hoarseness, difficulty swallowing. CV: Negative for chest pain, angina, palpitations, dyspnea on exertion, peripheral edema.  Respiratory: Negative for dyspnea at rest, cough, sputum, wheezing.  GI: See history of present illness. MS: Decreased mobility related to recent hip fracture repair.  Derm: Negative for rash or itching.  Endo: Negative for unusual weight change.  Heme: Negative for bruising or bleeding. Allergy: Negative for rash or hives.    Physical Exam: BP 139/77   Pulse 99   Temp 97.8 F (36.6 C) (Oral)   Ht 5\' 6"  (1.676 m)   Wt 149 lb 6.4 oz (67.8 kg)   BMI 24.11 kg/m  General:   Alert and oriented. Pleasant and cooperative. Well-nourished and well-developed.  Head:  Normocephalic and atraumatic. Eyes:  Without icterus, sclera clear and conjunctiva pink.  Ears:  Normal auditory acuity. Cardiovascular:  S1, S2 present without murmurs appreciated. Extremities without clubbing or edema. Respiratory:  Clear to auscultation bilaterally. No wheezes, rales, or rhonchi. No distress.  Gastrointestinal:  +BS, soft, non-tender and non-distended. No HSM noted. No guarding or rebound. No masses appreciated.  Rectal:  Deferred    Musculoskalatal:  Symmetrical without gross deformities. Skin:  Intact without significant lesions or rashes. Neurologic:  Alert and oriented x4;  grossly normal neurologically. Psych:  Alert and cooperative. Normal mood and affect. Heme/Lymph/Immune: No excessive bruising noted.    07/10/2016 3:01 PM   Disclaimer: This note was dictated with voice recognition software. Similar sounding words can inadvertently be transcribed and may not be corrected upon review.

## 2016-07-10 NOTE — Assessment & Plan Note (Signed)
The patient was referred for dysphagia. Barium pill esophagram with diffuse age-related dysmotility otherwise normal. The patient states her dysphagia has resolved since it started. She did recently have a CVA in March 2018 and is possible that she had temporary worsening swallowing abilities related to her CVA. We will continue to monitor at this time. Given that her symptoms have resolved no indication for upper endoscopy at this time. Return for follow-up in 2 months.

## 2016-07-10 NOTE — Assessment & Plan Note (Signed)
Objective weight loss of about 30 pounds in the past year. She is within 5 pounds overweight just after discharge from hip fracture/surgical repair. She underwent rehospitalization for CVA but no weight is available that I can find. The majority of her weight loss is likely during hospitalizations. She describes poor by mouth intake. States she has a good appetite but her daughter states she does not eat very much, typically only a 2 meals a day but minimal intake. She is seen nutrition was made recommendations. I recommended further supplements and ideas to help increase appetite and by mouth intake. I will have her come back in 2 months we can recheck her weight and other symptoms. She did just have a colonoscopy in 2015 which had benign polyps removed. The patient is not wanting a colonoscopy at this time and I do not feel it is really justified unless further weight loss is noted.

## 2016-07-11 ENCOUNTER — Encounter: Payer: Self-pay | Admitting: Nurse Practitioner

## 2016-07-11 NOTE — Progress Notes (Signed)
CC'ED TO PCP 

## 2016-07-12 DIAGNOSIS — C9210 Chronic myeloid leukemia, BCR/ABL-positive, not having achieved remission: Secondary | ICD-10-CM | POA: Diagnosis not present

## 2016-07-12 DIAGNOSIS — I69322 Dysarthria following cerebral infarction: Secondary | ICD-10-CM | POA: Diagnosis not present

## 2016-07-12 DIAGNOSIS — I10 Essential (primary) hypertension: Secondary | ICD-10-CM | POA: Diagnosis not present

## 2016-07-12 DIAGNOSIS — S72121D Displaced fracture of lesser trochanter of right femur, subsequent encounter for closed fracture with routine healing: Secondary | ICD-10-CM | POA: Diagnosis not present

## 2016-07-12 DIAGNOSIS — L89312 Pressure ulcer of right buttock, stage 2: Secondary | ICD-10-CM | POA: Diagnosis not present

## 2016-07-12 DIAGNOSIS — W19XXXD Unspecified fall, subsequent encounter: Secondary | ICD-10-CM | POA: Diagnosis not present

## 2016-07-12 DIAGNOSIS — E669 Obesity, unspecified: Secondary | ICD-10-CM | POA: Diagnosis not present

## 2016-07-12 DIAGNOSIS — E119 Type 2 diabetes mellitus without complications: Secondary | ICD-10-CM | POA: Diagnosis not present

## 2016-07-12 DIAGNOSIS — S72141D Displaced intertrochanteric fracture of right femur, subsequent encounter for closed fracture with routine healing: Secondary | ICD-10-CM | POA: Diagnosis not present

## 2016-07-15 ENCOUNTER — Ambulatory Visit (HOSPITAL_COMMUNITY): Payer: Medicare HMO | Attending: Orthopedic Surgery | Admitting: Physical Therapy

## 2016-07-15 ENCOUNTER — Encounter (HOSPITAL_COMMUNITY): Payer: Self-pay | Admitting: Physical Therapy

## 2016-07-15 DIAGNOSIS — R2681 Unsteadiness on feet: Secondary | ICD-10-CM

## 2016-07-15 DIAGNOSIS — R278 Other lack of coordination: Secondary | ICD-10-CM

## 2016-07-15 DIAGNOSIS — R29898 Other symptoms and signs involving the musculoskeletal system: Secondary | ICD-10-CM

## 2016-07-15 DIAGNOSIS — R262 Difficulty in walking, not elsewhere classified: Secondary | ICD-10-CM

## 2016-07-15 DIAGNOSIS — M6281 Muscle weakness (generalized): Secondary | ICD-10-CM | POA: Diagnosis not present

## 2016-07-15 NOTE — Therapy (Signed)
[facility] [place], [place], [address] Phone: [phone]   Fax:  [phone]  Physical Therapy Evaluation  Patient Details  Name: April Page MRN: [id number] Date of Birth: [birth year] Referring Provider: Nicholes Stairs   Encounter Date: 07/15/2016      PT End of Session - 07/15/16 1626    Visit Number 1   Number of Visits 17   Date for PT Re-Evaluation 08/12/16   Authorization Type [employer] H[employer]    Authorization Time Period 07/15/16 to 09/14/16   Authorization - Visit Number 1   Authorization - Number of Visits 10   PT Start Time 1346   PT Stop Time 1427   PT Time Calculation (min) 41 min   Activity Tolerance Patient tolerated treatment well   Behavior During Therapy [facility] for tasks assessed/performed      Past Medical History:  Diagnosis Date  . Arthritis    stiff knees  . Blood dyscrasia   . Cancer ([place])   . CVA (cerebral vascular accident) ([place])   . GERD (gastroesophageal reflux disease)   . H/O: CML (chronic myeloid leukemia)   . Hyperlipidemia   . Hypertension   . Obesity   . Pessary maintenance 07/01/2013    Past Surgical History:  Procedure Laterality Date  . ABDOMINAL HYSTERECTOMY    . CHOLECYSTECTOMY    . COLONOSCOPY  06/09/2002   SPQ:ZRAQTMAU hemorrhoids; otherwise normal rectum, colon   . COLONOSCOPY N/A 08/25/2013   QJF:HLKTGYB diverticulosis. Single colonic polyp-removed  s/p segmental biopsy and stool sample. random colon bx negative. +benign leiomyoma.  . cystocele/rectocele repair  2009  . ESOPHAGOGASTRODUODENOSCOPY  06/09/2002   WLS:LHTDSK upper gastrointestinal tract s/p  54-French Maloney dilator  . FEMUR IM NAIL Right 04/12/2016   Procedure: INTRAMEDULLARY (IM) NAIL FEMORAL;  Surgeon: Nicholes Stairs, MD;  Location: [facility];  Service: Orthopedics;  Laterality: Right;  . HERNIA REPAIR    . OOPHORECTOMY      There were no vitals filed for this visit.       Subjective  Assessment - 07/15/16 1349    Subjective Patient reports that she fell in her bathroom in February and broke her hip, she is not sure what they did to fix it besides putting metal in it. She also had a stroke on March 26th. Things ahve been going OK, family reports it is a slow process. Walking is hard, family reports that patietn does not use her hands as much and has just started to dress herself. No falls or close calls since the one that broke the hip. She was getting HHPT and home nursing but this is pretty much wrapped up.    Pertinent History HTN, CVA March 2018, DM, R hip fractures, leukemia, R femur IM nail s/p fall 04/12/16   How long can you sit comfortably? unlimited    How long can you stand comfortably? unlimited    How long can you walk comfortably? "not very far"    Patient Stated Goals improve mobility, improve independence, improve balance    Currently in Pain? No/denies            [facility] PT Assessment - 07/15/16 0001      Assessment   Medical Diagnosis hip orthopedic aftercare    Referring Provider Nicholes Stairs    Onset Date/Surgical Date 04/11/16   Next MD Visit Dr. Stann Mainland this summer    Prior Therapy HHPT      Precautions  Precautions Fall     Balance Screen   Has the patient fallen in the past 6 months Yes   How many times? 1   Has the patient had a decrease in activity level because of a fear of falling?  Yes   Is the patient reluctant to leave their home because of a fear of falling?  No     Prior Function   Level of Independence Requires assistive device for independence;Needs assistance with ADLs;Needs assistance with homemaking   Vocation Retired     Strength   Right Hip Flexion 3/5   Right Hip Extension 2/5   Right Hip ABduction 3/5   Left Hip Flexion 3/5   Left Hip Extension 2/5   Left Hip ABduction 4-/5   Right Knee Extension 5/5   Left Knee Extension 5/5   Right Ankle Dorsiflexion 5/5   Left Ankle Dorsiflexion 5/5      Ambulation/Gait   Gait Comments reduced gait speed, reduced stance time surgical LE, scissoring noted      6 minute walk test results    Aerobic Endurance Distance Walked 216   Endurance additional comments 3MWT, FWW      Standardized Balance Assessment   Standardized Balance Assessment Timed Up and Go Test     Timed Up and Go Test   Normal TUG (seconds) 36  FWW                            PT Education - 07/15/16 1624    Education provided Yes   Education Details IM nail general WBAT precautions, examination findings, POC, HEP   Person(s) Educated Patient;Child(ren)   Methods Explanation;Demonstration;Handout   Comprehension Verbalized understanding;Returned demonstration;Need further instruction          PT Short Term Goals - 07/15/16 1629      PT SHORT TERM GOAL #1   Title Patient to be able to identify 5/5 safety precautions in order to reduce fall risk and improve overall safety    Time 4   Period Weeks   Status New     PT SHORT TERM GOAL #2   Title Patient to be able to ambulate household distances with LRAD and no unsteadiness in order to improve mobility and independence    Time 4   Period Weeks   Status New     PT SHORT TERM GOAL #3   Title Patient to show improved gait mechanics including equal step lengths, improved gait speed, equal weight bearing B LEs, with LRAD in order to show improvement of general condition adn reduce compensation patterns related to injury and CVA    Time 4   Period Weeks   Status New     PT SHORT TERM GOAL #4   Title Patient to be able to correctly and consistently perform HEP at least 5 days per week, to be updated as appropriate    Time 1   Period Weeks   Status New           PT Long Term Goals - 07/15/16 1631      PT LONG TERM GOAL #1   Title Patient to show functional strength as being 4+/5 in order to improve mobilty and balance    Time 8   Period Weeks   Status New     PT LONG TERM GOAL #2    Title Patient to be able to ambulate household distances with no AD and no  unsteadiness, as well as unlimited distances in community with LRAD and no unsteadiness, in order to show improved mobiltiy with minimal fall risk    Time 8   Period Weeks   Status New     PT LONG TERM GOAL #3   Title Patient to be able to complete TUG test in 15 seconds or less with LRAD in order to show improved balance and reduced fall risk    Time 8   Period Weeks   Status New     PT LONG TERM GOAL #4   Title Patient and family to report that she has safely been able to complete all functional strength and balance based tasks needed to be independent without safety concern in order to facilitate return to PLOF    Time [place] - 07/15/16 1626    Clinical Impression Statement Patient arrives after having experienced a fall that led to a hip fracture and IM nail placement in early February; she also actually had an acute CVA in late March of 2018 as well. She arrives with her daughter today and they report that patient is very independent at baseline, they would like to try to get her as mobile and independent as possible with PT. Examination reveals significant functional muscle weakness, significant balance deficit, gait impairment, and increased fall risk due to combination of hip injury and recent CVA. Strongly recommend skilled PT services in order to address functional deficits, reduce fall risk, and optimize overall level of function and independence moving forward.    Rehab Potential Excellent   Clinical Impairments Affecting Rehab Potential (+) appears motivated, supportive family, high PLOF; (-) fall with serious injury, recent CVA   PT Frequency 2x / week   PT Duration 8 weeks   PT Treatment/Interventions ADLs/Self Care Home Management;Biofeedback;Cryotherapy;DME Instruction;Gait training;Stair training;Functional mobility training;Therapeutic  activities;Therapeutic exercise;Balance training;Neuromuscular re-education;Patient/family education;Manual techniques;Energy conservation;Taping   PT Next Visit Plan review HEP and initial eval/goals; functional strengthening in supine and standing, gait training, balance. Trial Nustep. Strongly encourage regular walking program.    PT Home Exercise Plan Eval: bridges, supine hip ABD with red TB, sit to stand with UEs, regular walking program    Recommended Other Services possible OT    Consulted and Agree with Plan of Care Patient      Patient will benefit from skilled therapeutic intervention in order to improve the following deficits and impairments:  Abnormal gait, Decreased coordination, Postural dysfunction, Decreased mobility, Decreased activity tolerance, Decreased strength, Decreased balance, Decreased safety awareness, Difficulty walking  Visit Diagnosis: Muscle weakness (generalized) - Plan: PT plan of care cert/re-cert  Difficulty in walking, not elsewhere classified - Plan: PT plan of care cert/re-cert  Unsteadiness on feet - Plan: PT plan of care cert/re-cert  Other symptoms and signs involving the musculoskeletal system - Plan: PT plan of care cert/re-cert  Other lack of coordination - Plan: PT plan of care cert/re-cert      G-Codes - 50/53/97 1634    Functional Assessment Tool Used (Outpatient Only) Based on skilled clinical assessment of strength, balance, gait pattern, fall risk    Functional Limitation Mobility: Walking and moving around   Mobility: Walking and Moving Around Current Status (Q7341) At least 60 percent but less than 80 percent impaired, limited or restricted   Mobility: Walking and Moving Around Goal Status (P3790) At least 40 percent but less  than 60 percent impaired, limited or restricted       Problem List Patient Active Problem List   Diagnosis Date Noted  . Constipation 07/10/2016  . Dysphagia 07/10/2016  . Loss of weight 07/10/2016  .  Impaired glucose tolerance 06/05/2016  . Slurred speech   . CVA (cerebral vascular accident) ([place]) 06/03/2016  . Lightheadedness 04/13/2016  . Hypokalemia 04/13/2016  . Hyperglycemia 04/13/2016  . Essential hypertension 04/13/2016  . Hyperlipidemia 04/13/2016  . S/P right hip fracture 04/12/2016  . Hip fracture ([place]) 04/12/2016  . Fall   . Itching in the vaginal area 10/05/2014  . Chronic myelogenous leukemia (CML), BCR-ABL1-positive ([place])   . GERD (gastroesophageal reflux disease) 12/06/2013  . Leukocytosis 08/06/2013  . Diabetes type 2, uncontrolled ([place]) 08/06/2013  . Atypical chest pain 08/06/2013  . Chronic diarrhea 08/05/2013  . Pessary maintenance 07/01/2013    Deniece Ree PT, DPT [facility] [address] [place], [place], [address] Phone: [phone]   Fax:  [phone]  Name: TNYA ADES MRN: [id number] Date of Birth: [birth year]

## 2016-07-15 NOTE — Patient Instructions (Signed)
   BRIDGING  While lying on your back, tighten your lower abdominals, squeeze your buttocks and then raise your buttocks off the floor/bed as creating a "Bridge" with your body.   Repeat 10-15 times, twice a day.     ELASTIC BAND - SUPINE HIP ABDUCTION (BAND AROUND YOUR ANKLES)  While lying on your back, slowly bring your leg out to the side. Keep  your knee straight the entire time. Make sure your toes are pointing up, not out to the side.  Repeat 10-15 times each side, twice a day.     SIT STAND - BOTH HANDS ASSIST  While seated in a chair, scoot forward towards the edge of the chair. Next, hold on to the arm rest with both hands for support and then raise up to standing.  Repeat 10 times, twice a day.    REGULAR WALKING  Continue with the walking program that home health PT gave you.  Every 3rd day, add 3-5 minutes to how long you are walking at one time.   Example:   Day 1: walk for 5 minutes at a time  Day 2: walk for 5 minutes at a time  Day 3: walk for 5 minutes at a time  Day 4: walk for 8-10 minutes at a time  Etc  Continue to add time every 3-4 days.

## 2016-07-17 ENCOUNTER — Telehealth (HOSPITAL_COMMUNITY): Payer: Self-pay | Admitting: Internal Medicine

## 2016-07-17 NOTE — Telephone Encounter (Signed)
07/17/16 daughter called and cx because she said that she had to work and couldn't bring her mom

## 2016-07-18 ENCOUNTER — Ambulatory Visit (HOSPITAL_COMMUNITY): Payer: Medicare HMO

## 2016-07-18 DIAGNOSIS — E119 Type 2 diabetes mellitus without complications: Secondary | ICD-10-CM | POA: Diagnosis not present

## 2016-07-18 DIAGNOSIS — I10 Essential (primary) hypertension: Secondary | ICD-10-CM | POA: Diagnosis not present

## 2016-07-18 DIAGNOSIS — S72141D Displaced intertrochanteric fracture of right femur, subsequent encounter for closed fracture with routine healing: Secondary | ICD-10-CM | POA: Diagnosis not present

## 2016-07-18 DIAGNOSIS — C9210 Chronic myeloid leukemia, BCR/ABL-positive, not having achieved remission: Secondary | ICD-10-CM | POA: Diagnosis not present

## 2016-07-18 DIAGNOSIS — W19XXXD Unspecified fall, subsequent encounter: Secondary | ICD-10-CM | POA: Diagnosis not present

## 2016-07-18 DIAGNOSIS — S72121D Displaced fracture of lesser trochanter of right femur, subsequent encounter for closed fracture with routine healing: Secondary | ICD-10-CM | POA: Diagnosis not present

## 2016-07-18 DIAGNOSIS — L89312 Pressure ulcer of right buttock, stage 2: Secondary | ICD-10-CM | POA: Diagnosis not present

## 2016-07-18 DIAGNOSIS — E669 Obesity, unspecified: Secondary | ICD-10-CM | POA: Diagnosis not present

## 2016-07-18 DIAGNOSIS — I69322 Dysarthria following cerebral infarction: Secondary | ICD-10-CM | POA: Diagnosis not present

## 2016-07-23 ENCOUNTER — Ambulatory Visit (HOSPITAL_COMMUNITY): Payer: Medicare HMO

## 2016-07-23 DIAGNOSIS — M6281 Muscle weakness (generalized): Secondary | ICD-10-CM

## 2016-07-23 DIAGNOSIS — R2681 Unsteadiness on feet: Secondary | ICD-10-CM

## 2016-07-23 DIAGNOSIS — R262 Difficulty in walking, not elsewhere classified: Secondary | ICD-10-CM

## 2016-07-23 DIAGNOSIS — R278 Other lack of coordination: Secondary | ICD-10-CM

## 2016-07-23 DIAGNOSIS — R29898 Other symptoms and signs involving the musculoskeletal system: Secondary | ICD-10-CM | POA: Diagnosis not present

## 2016-07-23 NOTE — Therapy (Signed)
[place], [place], [address] Phone: [phone]   Fax:  [phone]  Physical Therapy Treatment  Patient Details  Name: April Page MRN: [id number] Date of Birth: 06/01/1945 Referring Provider: Nicholes Stairs   Encounter Date: 07/23/2016      PT End of Session - 07/23/16 1619    Visit Number 2   Number of Visits 17   Date for PT Re-Evaluation 08/12/16   Authorization Type [employer] H[employer]    Authorization Time Period 07/15/16 to 09/14/16   Authorization - Visit Number 2   Authorization - Number of Visits 10   PT Start Time 1027   PT Stop Time 1652   PT Time Calculation (min) 48 min   Equipment Utilized During Treatment Gait belt   Activity Tolerance Patient tolerated treatment well;No increased pain;Patient limited by fatigue   Behavior During Therapy [facility] for tasks assessed/performed      Past Medical History:  Diagnosis Date  . Arthritis    stiff knees  . Blood dyscrasia   . Cancer ([place])   . CVA (cerebral vascular accident) ([place])   . GERD (gastroesophageal reflux disease)   . H/O: CML (chronic myeloid leukemia)   . Hyperlipidemia   . Hypertension   . Obesity   . Pessary maintenance 07/01/2013    Past Surgical History:  Procedure Laterality Date  . ABDOMINAL HYSTERECTOMY    . CHOLECYSTECTOMY    . COLONOSCOPY  06/09/2002   OZD:[id number] hemorrhoids; otherwise normal rectum, colon   . COLONOSCOPY N/A 08/25/2013   QQV:ZDGLOVF diverticulosis. Single colonic polyp-removed  s/p segmental biopsy and stool sample. random colon bx negative. +benign leiomyoma.  . cystocele/rectocele repair  2009  . ESOPHAGOGASTRODUODENOSCOPY  06/09/2002   IEP:PIRJJO upper gastrointestinal tract s/p  54-French Maloney dilator  . FEMUR IM NAIL Right 04/12/2016   Procedure: INTRAMEDULLARY (IM) NAIL FEMORAL;  Surgeon: Nicholes Stairs, MD;  Location: [facility];  Service: Orthopedics;  Laterality: Right;  . HERNIA  REPAIR    . OOPHORECTOMY      There were no vitals filed for this visit.      Subjective Assessment - 07/23/16 1611    Subjective Pt reports no reports of pain or recent falls.  Reports she has complete 3 sets with HEP in last 10 days, no questions concerning HEP.     Pertinent History HTN, CVA March 2018, DM, R hip fractures, leukemia, R femur IM nail s/p fall 04/12/16   Patient Stated Goals improve mobility, improve independence, improve balance    Currently in Pain? No/denies                         [facility] Adult PT Treatment/Exercise - 07/23/16 0001      Ambulation/Gait   Ambulation Distance (Feet) 200 Feet   Assistive device Rolling walker   Gait Comments Cueing for heel to toe mechanics, encouraged to increase stance phase and stride length Rt LE     Exercises   Exercises Knee/Hip     Knee/Hip Exercises: Aerobic   Nustep [place] L2 UE/LE x 5'     Knee/Hip Exercises: Seated   Sit to Sand 10 reps;without UE support  elevated height     Knee/Hip Exercises: Supine   Bridges 2 sets;10 reps   Straight Leg Raises 10 reps   Other Supine Knee/Hip Exercises supine ABD with RTB 2x 10     Knee/Hip Exercises: Sidelying   Hip  ABduction Both;1 set;10 reps   Hip ABduction Limitations therapist facilitation for form   Clams 2x 10                PT Education - 07/23/16 1659    Education provided Yes   Education Details Reviewed goals, assured compliance and encouraged to increase frequency with HEP, copy of eval given to pt.  Reviewed safety factors and Gait training   Person(s) Educated Patient;Child(ren)   Methods Explanation;Demonstration;Handout   Comprehension Verbalized understanding;Returned demonstration;Need further instruction          PT Short Term Goals - 07/15/16 1629      PT SHORT TERM GOAL #1   Title Patient to be able to identify 5/5 safety precautions in order to reduce fall risk and improve overall safety    Time 4   Period Weeks    Status New     PT SHORT TERM GOAL #2   Title Patient to be able to ambulate household distances with LRAD and no unsteadiness in order to improve mobility and independence    Time 4   Period Weeks   Status New     PT SHORT TERM GOAL #3   Title Patient to show improved gait mechanics including equal step lengths, improved gait speed, equal weight bearing B LEs, with LRAD in order to show improvement of general condition adn reduce compensation patterns related to injury and CVA    Time 4   Period Weeks   Status New     PT SHORT TERM GOAL #4   Title Patient to be able to correctly and consistently perform HEP at least 5 days per week, to be updated as appropriate    Time 1   Period Weeks   Status New           PT Long Term Goals - 07/15/16 1631      PT LONG TERM GOAL #1   Title Patient to show functional strength as being 4+/5 in order to improve mobilty and balance    Time 8   Period Weeks   Status New     PT LONG TERM GOAL #2   Title Patient to be able to ambulate household distances with no AD and no unsteadiness, as well as unlimited distances in community with LRAD and no unsteadiness, in order to show improved mobiltiy with minimal fall risk    Time 8   Period Weeks   Status New     PT LONG TERM GOAL #3   Title Patient to be able to complete TUG test in 15 seconds or less with LRAD in order to show improved balance and reduced fall risk    Time 8   Period Weeks   Status New     PT LONG TERM GOAL #4   Title Patient and family to report that she has safely been able to complete all functional strength and balance based tasks needed to be independent without safety concern in order to facilitate return to PLOF    Time [place] - 07/23/16 1626    Clinical Impression Statement Reviewed goals, assured compliance and proper form/technique with HEP and encouraged to increased compliance for maximal benefits.  Reviewed  safety factors for fall prevention in home, pt and daughter but verbally understood suggestions with night light in hallway for safety.  Gait trianing  with cueing to increase stance phase and stride length with Rt LE, encouraged pt to begin walking program indoors.  Therex focus on proximal strengthening with therapist facilitation for proper form.  No reports of pain through session. was limited by fatigue.     Rehab Potential Excellent   Clinical Impairments Affecting Rehab Potential (+) appears motivated, supportive family, high PLOF; (-) fall with serious injury, recent CVA   PT Frequency 2x / week   PT Duration 8 weeks   PT Treatment/Interventions ADLs/Self Care Home Management;Biofeedback;Cryotherapy;DME Instruction;Gait training;Stair training;Functional mobility training;Therapeutic activities;Therapeutic exercise;Balance training;Neuromuscular re-education;Patient/family education;Manual techniques;Energy conservation;Taping   PT Next Visit Plan functional strengthening in supine and standing, gait training, balance. Trial Nustep. Strongly encourage regular walking program.    PT Home Exercise Plan Eval: bridges, supine hip ABD with red TB, sit to stand with UEs, regular walking program       Patient will benefit from skilled therapeutic intervention in order to improve the following deficits and impairments:  Abnormal gait, Decreased coordination, Postural dysfunction, Decreased mobility, Decreased activity tolerance, Decreased strength, Decreased balance, Decreased safety awareness, Difficulty walking  Visit Diagnosis: Muscle weakness (generalized)  Difficulty in walking, not elsewhere classified  Unsteadiness on feet  Other symptoms and signs involving the musculoskeletal system  Other lack of coordination     Problem List Patient Active Problem List   Diagnosis Date Noted  . Constipation 07/10/2016  . Dysphagia 07/10/2016  . Loss of weight 07/10/2016  . Impaired glucose  tolerance 06/05/2016  . Slurred speech   . CVA (cerebral vascular accident) ([place]) 06/03/2016  . Lightheadedness 04/13/2016  . Hypokalemia 04/13/2016  . Hyperglycemia 04/13/2016  . Essential hypertension 04/13/2016  . Hyperlipidemia 04/13/2016  . S/P right hip fracture 04/12/2016  . Hip fracture ([place]) 04/12/2016  . Fall   . Itching in the vaginal area 10/05/2014  . Chronic myelogenous leukemia (CML), BCR-ABL1-positive ([place])   . GERD (gastroesophageal reflux disease) 12/06/2013  . Leukocytosis 08/06/2013  . Diabetes type 2, uncontrolled ([place]) 08/06/2013  . Atypical chest pain 08/06/2013  . Chronic diarrhea 08/05/2013  . Pessary maintenance 07/01/2013   Ihor Austin, [place]; [place]  Aldona Lento 07/23/2016, 5:00 PM  [facility] [address] [place], [place], [address] Phone: [phone]   Fax:  [phone]  Name: April Page MRN: [id number] Date of Birth: [birth year]

## 2016-07-25 ENCOUNTER — Ambulatory Visit (HOSPITAL_COMMUNITY): Payer: Medicare HMO | Admitting: Physical Therapy

## 2016-07-25 DIAGNOSIS — R29898 Other symptoms and signs involving the musculoskeletal system: Secondary | ICD-10-CM

## 2016-07-25 DIAGNOSIS — M6281 Muscle weakness (generalized): Secondary | ICD-10-CM

## 2016-07-25 DIAGNOSIS — R262 Difficulty in walking, not elsewhere classified: Secondary | ICD-10-CM

## 2016-07-25 DIAGNOSIS — R278 Other lack of coordination: Secondary | ICD-10-CM

## 2016-07-25 DIAGNOSIS — R2681 Unsteadiness on feet: Secondary | ICD-10-CM

## 2016-07-25 NOTE — Therapy (Signed)
[facility] [address] [place], [place], [address] Phone: [phone]   Fax:  [phone]  Physical Therapy Treatment  Patient Details  Name: April Page MRN: [id number] Date of Birth: [birth year] Referring Provider: Nicholes Stairs   Encounter Date: 07/25/2016      PT End of Session - 07/25/16 1755    Visit Number 3   Number of Visits 17   Date for PT Re-Evaluation 08/12/16   Authorization Type [employer] HMO    Authorization Time Period 07/15/16 to 09/14/16   Authorization - Visit Number 3   Authorization - Number of Visits 10   PT Start Time 1612  Nustep not included in billing    PT Stop Time 1642   PT Time Calculation (min) 30 min   Equipment Utilized During Treatment Gait belt   Activity Tolerance Patient tolerated treatment well   Behavior During Therapy [facility] for tasks assessed/performed      Past Medical History:  Diagnosis Date  . Arthritis    stiff knees  . Blood dyscrasia   . Cancer ([place])   . CVA (cerebral vascular accident) ([place])   . GERD (gastroesophageal reflux disease)   . H/O: CML (chronic myeloid leukemia)   . Hyperlipidemia   . Hypertension   . Obesity   . Pessary maintenance 07/01/2013    Past Surgical History:  Procedure Laterality Date  . ABDOMINAL HYSTERECTOMY    . CHOLECYSTECTOMY    . COLONOSCOPY  06/09/2002   MLY:[id number] hemorrhoids; otherwise normal rectum, colon   . COLONOSCOPY N/A 08/25/2013   KCL:EXNTZGY diverticulosis. Single colonic polyp-removed  s/p segmental biopsy and stool sample. random colon bx negative. +benign leiomyoma.  . cystocele/rectocele repair  2009  . ESOPHAGOGASTRODUODENOSCOPY  06/09/2002   FVC:BSWHQP upper gastrointestinal tract s/p  54-French Maloney dilator  . FEMUR IM NAIL Right 04/12/2016   Procedure: INTRAMEDULLARY (IM) NAIL FEMORAL;  Surgeon: Nicholes Stairs, MD;  Location: [facility];  Service: Orthopedics;  Laterality: Right;  . HERNIA REPAIR    .  OOPHORECTOMY      There were no vitals filed for this visit.      Subjective Assessment - 07/25/16 1751    Subjective Patient arrives stating no major changes or falls    Pertinent History HTN, CVA March 2018, DM, R hip fractures, leukemia, R femur IM nail s/p fall 04/12/16   Patient Stated Goals improve mobility, improve independence, improve balance    Currently in Pain? No/denies                         [facility] Adult PT Treatment/Exercise - 07/25/16 0001      Ambulation/Gait   Ambulation Distance (Feet) 30 Feet   Assistive device Small based quad cane   Gait Pattern Step-through pattern;Decreased stance time - right;Decreased stance time - left;Decreased stride length;Decreased dorsiflexion - right;Decreased dorsiflexion - left;Decreased weight shift to right;Shuffle;Antalgic;Narrow base of support   Ambulation Surface Level   Gait Comments min guard/min A for correct form with gait with SBQC; significant unsteadiness noted      Knee/Hip Exercises: Aerobic   Nustep Nustep level 2 no hills UE/LE x8 minutes   not included in billing      Knee/Hip Exercises: Standing   Heel Raises Both;1 set;15 reps   Heel Raises Limitations heel and toe, B UEs    Forward Lunges Both;1 set;15 reps   Forward Lunges Limitations poor form B UEs  Forward Step Up Both;1 set;15 reps   Forward Step Up Limitations B UEs      Knee/Hip Exercises: Seated   Long Arc Quad Both;1 set;10 reps   Long Arc Quad Weight 3 lbs.   Sit to Sand 5 reps;without UE support  elevated height              Balance Exercises - 07/25/16 1753      Balance Exercises: Standing   Tandem Stance Eyes open;2 reps;10 secs   Other Standing Exercises narrow BOS on foam with ball tosses           PT Education - 07/25/16 1753    Education provided Yes   Education Details imoprtance of challenging activities, general safety utilized during PT to prevent falls    Person(s) Educated Patient   Methods  Explanation   Comprehension Verbalized understanding          PT Short Term Goals - 07/15/16 1629      PT SHORT TERM GOAL #1   Title Patient to be able to identify 5/5 safety precautions in order to reduce fall risk and improve overall safety    Time 4   Period Weeks   Status New     PT SHORT TERM GOAL #2   Title Patient to be able to ambulate household distances with LRAD and no unsteadiness in order to improve mobility and independence    Time 4   Period Weeks   Status New     PT SHORT TERM GOAL #3   Title Patient to show improved gait mechanics including equal step lengths, improved gait speed, equal weight bearing B LEs, with LRAD in order to show improvement of general condition adn reduce compensation patterns related to injury and CVA    Time 4   Period Weeks   Status New     PT SHORT TERM GOAL #4   Title Patient to be able to correctly and consistently perform HEP at least 5 days per week, to be updated as appropriate    Time 1   Period Weeks   Status New           PT Long Term Goals - 07/15/16 1631      PT LONG TERM GOAL #1   Title Patient to show functional strength as being 4+/5 in order to improve mobilty and balance    Time 8   Period Weeks   Status New     PT LONG TERM GOAL #2   Title Patient to be able to ambulate household distances with no AD and no unsteadiness, as well as unlimited distances in community with LRAD and no unsteadiness, in order to show improved mobiltiy with minimal fall risk    Time 8   Period Weeks   Status New     PT LONG TERM GOAL #3   Title Patient to be able to complete TUG test in 15 seconds or less with LRAD in order to show improved balance and reduced fall risk    Time 8   Period Weeks   Status New     PT LONG TERM GOAL #4   Title Patient and family to report that she has safely been able to complete all functional strength and balance based tasks needed to be independent without safety concern in order to  facilitate return to PLOF    Time 8   Period Weeks   Status New  Plan - 07/25/16 1755    Clinical Impression Statement Patient arrives today in good spirits and motivated to participate in PT. Began session on Nustep with increased parameters (not included in billing). Proceeded with gait training with [facility] with significant difficulty noted and Min guard-MinA required for safety. Otherwise performed functional strengthening and balance tasks with severe fall risk noted, difficulty with balance activities due to fear of falling today. Patient is quite deconditioned in general and will strongly benefit from skilled PT services moving forward.    Rehab Potential Excellent   Clinical Impairments Affecting Rehab Potential (+) appears motivated, supportive family, high PLOF; (-) fall with serious injury, recent CVA   PT Frequency 2x / week   PT Duration 8 weeks   PT Treatment/Interventions ADLs/Self Care Home Management;Biofeedback;Cryotherapy;DME Instruction;Gait training;Stair training;Functional mobility training;Therapeutic activities;Therapeutic exercise;Balance training;Neuromuscular re-education;Patient/family education;Manual techniques;Energy conservation;Taping   PT Next Visit Plan continue functional strength for proximal muscles and in [facility] psitions. Gait training with SBQC. Continue Nustep. BALANCE!   PT Home Exercise Plan Eval: bridges, supine hip ABD with red TB, sit to stand with UEs, regular walking program    Consulted and Agree with Plan of Care Patient      Patient will benefit from skilled therapeutic intervention in order to improve the following deficits and impairments:  Abnormal gait, Decreased coordination, Postural dysfunction, Decreased mobility, Decreased activity tolerance, Decreased strength, Decreased balance, Decreased safety awareness, Difficulty walking  Visit Diagnosis: Muscle weakness (generalized)  Difficulty in walking, not elsewhere  classified  Unsteadiness on feet  Other symptoms and signs involving the musculoskeletal system  Other lack of coordination     Problem List Patient Active Problem List   Diagnosis Date Noted  . Constipation 07/10/2016  . Dysphagia 07/10/2016  . Loss of weight 07/10/2016  . Impaired glucose tolerance 06/05/2016  . Slurred speech   . CVA (cerebral vascular accident) ([place]) 06/03/2016  . Lightheadedness 04/13/2016  . Hypokalemia 04/13/2016  . Hyperglycemia 04/13/2016  . Essential hypertension 04/13/2016  . Hyperlipidemia 04/13/2016  . S/P right hip fracture 04/12/2016  . Hip fracture ([place]) 04/12/2016  . Fall   . Itching in the vaginal area 10/05/2014  . Chronic myelogenous leukemia (CML), BCR-ABL1-positive ([place])   . GERD (gastroesophageal reflux disease) 12/06/2013  . Leukocytosis 08/06/2013  . Diabetes type 2, uncontrolled ([place]) 08/06/2013  . Atypical chest pain 08/06/2013  . Chronic diarrhea 08/05/2013  . Pessary maintenance 07/01/2013    Deniece Ree PT, DPT [facility] [address] [place], [place], [address] Phone: [phone]   Fax:  [phone]  Name: ADALEEN HULGAN MRN: [id number] Date of Birth: 17-Feb-1946

## 2016-07-26 ENCOUNTER — Encounter (HOSPITAL_COMMUNITY): Payer: Self-pay

## 2016-07-26 NOTE — Progress Notes (Addendum)
Nutrition Follow-up:  Spoke with patient via phone this am.  Patient reports appetite is about the same as it was a month ago.  Does not really want to eat much. Reports for breakfast has cereal and banana or polish sausage, then for lunch maybe a hot dog, often skips evening meal.  "I am just not hungry" Reports that she drank ensure clear yesterday. Reports she drinks about 1 per day. Does not like the milky oral nutrition supplements.   Reports that she swallows food well, no other nutrition side effects mentioned today.   Medications: reviewed  Labs: no new labs  Anthropometrics:   Per chart noted weight on 5/2 of 149 lb 6.4 oz, decreased from 153 lb on 4/20 2% weight loss in 1 month  Patient is meeting with physical therapy and reports that she is getting stronger  NUTRITION DIAGNOSIS: Inadequate oral intake continues   MALNUTRITION DIAGNOSIS: continue to assess   INTERVENTION:   Encouraged intake of ensure clear at least 2 times per day  Encouraged patient to always have an evening meal and not skip.  Discussed options for her with high calories and protein.   Patient may benefit from appetite stimulant.    MONITORING, EVALUATION, GOAL: patient will consume adequate calories and protein to meet nutritional needs and maintain weight   NEXT VISIT: as needed  April Page, [place], [place] Registered Dietitian [phone] (pager)

## 2016-07-29 ENCOUNTER — Ambulatory Visit (HOSPITAL_COMMUNITY): Payer: Medicare HMO | Admitting: Physical Therapy

## 2016-07-29 DIAGNOSIS — R278 Other lack of coordination: Secondary | ICD-10-CM | POA: Diagnosis not present

## 2016-07-29 DIAGNOSIS — R262 Difficulty in walking, not elsewhere classified: Secondary | ICD-10-CM | POA: Diagnosis not present

## 2016-07-29 DIAGNOSIS — R29898 Other symptoms and signs involving the musculoskeletal system: Secondary | ICD-10-CM

## 2016-07-29 DIAGNOSIS — R2681 Unsteadiness on feet: Secondary | ICD-10-CM | POA: Diagnosis not present

## 2016-07-29 DIAGNOSIS — M6281 Muscle weakness (generalized): Secondary | ICD-10-CM | POA: Diagnosis not present

## 2016-07-29 NOTE — Therapy (Signed)
[place], [place], [address] Phone: [phone]   Fax:  [phone]  Wound Care Therapy  Patient Details  Name: April Page MRN: [id number] Date of Birth: 1945/12/22 Referring Provider: Nicholes Stairs   Encounter Date: 07/29/2016      PT End of Session - 07/29/16 1710    Visit Number 4   Number of Visits 17   Date for PT Re-Evaluation 08/12/16   Authorization Type [employer]    Authorization Time Period 07/15/16 to 09/14/16   Authorization - Visit Number 4   Authorization - Number of Visits 10   PT Start Time 1602   PT Stop Time 1648   PT Time Calculation (min) 46 min   Equipment Utilized During Treatment Gait belt   Activity Tolerance Patient tolerated treatment well   Behavior During Therapy [facility] for tasks assessed/performed      Past Medical History:  Diagnosis Date  . Arthritis    stiff knees  . Blood dyscrasia   . Cancer ([place])   . CVA (cerebral vascular accident) ([place])   . GERD (gastroesophageal reflux disease)   . H/O: CML (chronic myeloid leukemia)   . Hyperlipidemia   . Hypertension   . Obesity   . Pessary maintenance 07/01/2013    Past Surgical History:  Procedure Laterality Date  . ABDOMINAL HYSTERECTOMY    . CHOLECYSTECTOMY    . COLONOSCOPY  06/09/2002   UUE:KCMKLKJZ hemorrhoids; otherwise normal rectum, colon   . COLONOSCOPY N/A 08/25/2013   PHX:TAVWPVX diverticulosis. Single colonic polyp-removed  s/p segmental biopsy and stool sample. random colon bx negative. +benign leiomyoma.  . cystocele/rectocele repair  2009  . ESOPHAGOGASTRODUODENOSCOPY  06/09/2002   YIA:XKPVVZ upper gastrointestinal tract s/p  54-French Maloney dilator  . FEMUR IM NAIL Right 04/12/2016   Procedure: INTRAMEDULLARY (IM) NAIL FEMORAL;  Surgeon: Nicholes Stairs, MD;  Location: [place];  Service: Orthopedics;  Laterality: Right;  . HERNIA REPAIR    . OOPHORECTOMY      There were no vitals  filed for this visit.       Subjective Assessment - 07/29/16 1617    Subjective PT states she is doing well today overall. Reports she only does her excercises when her daughter makes her do them and states "Im lazy".     Currently in Pain? No/denies                    [facility] Adult PT Treatment/Exercise - 07/29/16 0001      Ambulation/Gait   Ambulation/Gait --  100 feet X2   Ambulation Distance (Feet) 200 Feet   Assistive device Small based quad cane   Gait Pattern Step-through pattern;Decreased stance time - right;Decreased stance time - left;Decreased stride length;Decreased dorsiflexion - right;Decreased dorsiflexion - left;Decreased weight shift to right;Shuffle;Antalgic;Narrow base of support   Ambulation Surface Level;Indoor   Gait Comments min guard/min A for correct form with gait with SBQC; significant unsteadiness noted      Knee/Hip Exercises: Aerobic   Nustep Nustep level 2 no hills UE/LE x8 minutes   EOS LE's only staying at 40 RPM , not included in billing     Knee/Hip Exercises: Standing   Heel Raises Both;1 set;15 reps   Heel Raises Limitations heel and toe, B UEs    Forward Lunges Both;1 set;15 reps   Forward Lunges Limitations onto 4" step with 1 HHA   Lateral Step Up Both;Step Height: 4";Hand Hold: 1;10  reps   Lateral Step Up Limitations 1 UE assist   Forward Step Up Both;1 set;15 reps   Forward Step Up Limitations B UEs      Knee/Hip Exercises: Seated   Sit to Sand 10 reps;without UE support  from standard chair height                  PT Short Term Goals - 07/15/16 1629      PT SHORT TERM GOAL #1   Title Patient to be able to identify 5/5 safety precautions in order to reduce fall risk and improve overall safety    Time 4   Period Weeks   Status New     PT SHORT TERM GOAL #2   Title Patient to be able to ambulate household distances with LRAD and no unsteadiness in order to improve mobility and independence    Time 4    Period Weeks   Status New     PT SHORT TERM GOAL #3   Title Patient to show improved gait mechanics including equal step lengths, improved gait speed, equal weight bearing B LEs, with LRAD in order to show improvement of general condition adn reduce compensation patterns related to injury and CVA    Time 4   Period Weeks   Status New     PT SHORT TERM GOAL #4   Title Patient to be able to correctly and consistently perform HEP at least 5 days per week, to be updated as appropriate    Time 1   Period Weeks   Status New           PT Long Term Goals - 07/15/16 1631      PT LONG TERM GOAL #1   Title Patient to show functional strength as being 4+/5 in order to improve mobilty and balance    Time 8   Period Weeks   Status New     PT LONG TERM GOAL #2   Title Patient to be able to ambulate household distances with no AD and no unsteadiness, as well as unlimited distances in community with LRAD and no unsteadiness, in order to show improved mobiltiy with minimal fall risk    Time 8   Period Weeks   Status New     PT LONG TERM GOAL #3   Title Patient to be able to complete TUG test in 15 seconds or less with LRAD in order to show improved balance and reduced fall risk    Time 8   Period Weeks   Status New     PT LONG TERM GOAL #4   Title Patient and family to report that she has safely been able to complete all functional strength and balance based tasks needed to be independent without safety concern in order to facilitate return to PLOF    Time 8   Period Weeks   Status New               Plan - 07/29/16 1711    Clinical Impression Statement Continued with strengthening, encourgaement needed to increase activity.  PT able to complete session wtih minimal rests, however with slow, unsteady mobility.  Finished session with nustep, focusing on LE's and increased speed over 40 SPM.  Pt reported overall fatigue at end of session .   Rehab Potential Excellent   Clinical  Impairments Affecting Rehab Potential (+) appears motivated, supportive family, high PLOF; (-) fall with serious injury, recent CVA   PT Frequency  2x / week   PT Duration 8 weeks   PT Treatment/Interventions ADLs/Self Care Home Management;Biofeedback;Cryotherapy;DME Instruction;Gait training;Stair training;Functional mobility training;Therapeutic activities;Therapeutic exercise;Balance training;Neuromuscular re-education;Patient/family education;Manual techniques;Energy conservation;Taping   PT Next Visit Plan continue functional strength for proximal muscles and in CKC psitions. Gait training with SBQC. Continue Nustep. BALANCE!  Next session begin vector stance.   PT Home Exercise Plan Eval: bridges, supine hip ABD with red TB, sit to stand with UEs, regular walking program    Consulted and Agree with Plan of Care Patient      Patient will benefit from skilled therapeutic intervention in order to improve the following deficits and impairments:  Abnormal gait, Decreased coordination, Postural dysfunction, Decreased mobility, Decreased activity tolerance, Decreased strength, Decreased balance, Decreased safety awareness, Difficulty walking  Visit Diagnosis: Muscle weakness (generalized)  Difficulty in walking, not elsewhere classified  Unsteadiness on feet  Other symptoms and signs involving the musculoskeletal system  Other lack of coordination     Problem List Patient Active Problem List   Diagnosis Date Noted  . Constipation 07/10/2016  . Dysphagia 07/10/2016  . Loss of weight 07/10/2016  . Impaired glucose tolerance 06/05/2016  . Slurred speech   . CVA (cerebral vascular accident) ([place]) 06/03/2016  . Lightheadedness 04/13/2016  . Hypokalemia 04/13/2016  . Hyperglycemia 04/13/2016  . Essential hypertension 04/13/2016  . Hyperlipidemia 04/13/2016  . S/P right hip fracture 04/12/2016  . Hip fracture ([place]) 04/12/2016  . Fall   . Itching in the vaginal area 10/05/2014  .  Chronic myelogenous leukemia (CML), BCR-ABL1-positive ([place])   . GERD (gastroesophageal reflux disease) 12/06/2013  . Leukocytosis 08/06/2013  . Diabetes type 2, uncontrolled ([place]) 08/06/2013  . Atypical chest pain 08/06/2013  . Chronic diarrhea 08/05/2013  . Pessary maintenance 07/01/2013    Teena Irani, PTA/CLT [phone]  07/29/2016, 5:49 PM  [facility] [address] [place], [place], [address] Phone: [phone]   Fax:  [phone]  Name: ANNTOINETTE HAEFELE MRN: [id number] Date of Birth: November 30, 1945

## 2016-08-01 ENCOUNTER — Ambulatory Visit (HOSPITAL_COMMUNITY): Payer: Medicare HMO

## 2016-08-01 DIAGNOSIS — R2681 Unsteadiness on feet: Secondary | ICD-10-CM | POA: Diagnosis not present

## 2016-08-01 DIAGNOSIS — R29898 Other symptoms and signs involving the musculoskeletal system: Secondary | ICD-10-CM | POA: Diagnosis not present

## 2016-08-01 DIAGNOSIS — M6281 Muscle weakness (generalized): Secondary | ICD-10-CM | POA: Diagnosis not present

## 2016-08-01 DIAGNOSIS — R262 Difficulty in walking, not elsewhere classified: Secondary | ICD-10-CM

## 2016-08-01 DIAGNOSIS — R278 Other lack of coordination: Secondary | ICD-10-CM | POA: Diagnosis not present

## 2016-08-01 NOTE — Therapy (Signed)
[place], [place], [address] Phone: [phone]   Fax:  [phone]  Physical Therapy Treatment  Patient Details  Name: April Page MRN: [id number] Date of Birth: [birth year] Referring Provider: Nicholes Stairs   Encounter Date: 08/01/2016      PT End of Session - 08/01/16 1609    Visit Number 5   Number of Visits 17   Date for PT Re-Evaluation 08/12/16   Authorization Type [employer] HMO    Authorization Time Period 07/15/16 to 09/14/16   Authorization - Visit Number 5   Authorization - Number of Visits 10   PT Start Time 6962   PT Stop Time 9528   PT Time Calculation (min) 49 min   Equipment Utilized During Treatment Gait belt   Activity Tolerance Patient tolerated treatment well;No increased pain;Patient limited by fatigue   Behavior During Therapy [facility] for tasks assessed/performed      Past Medical History:  Diagnosis Date  . Arthritis    stiff knees  . Blood dyscrasia   . Cancer ([place])   . CVA (cerebral vascular accident) ([place])   . GERD (gastroesophageal reflux disease)   . H/O: CML (chronic myeloid leukemia)   . Hyperlipidemia   . Hypertension   . Obesity   . Pessary maintenance 07/01/2013    Past Surgical History:  Procedure Laterality Date  . ABDOMINAL HYSTERECTOMY    . CHOLECYSTECTOMY    . COLONOSCOPY  06/09/2002   UXL:KGMWNUUV hemorrhoids; otherwise normal rectum, colon   . COLONOSCOPY N/A 08/25/2013   OZD:GUYQIHK diverticulosis. Single colonic polyp-removed  s/p segmental biopsy and stool sample. random colon bx negative. +benign leiomyoma.  . cystocele/rectocele repair  2009  . ESOPHAGOGASTRODUODENOSCOPY  06/09/2002   VQQ:VZDGLO upper gastrointestinal tract s/p  54-French Maloney dilator  . FEMUR IM NAIL Right 04/12/2016   Procedure: INTRAMEDULLARY (IM) NAIL FEMORAL;  Surgeon: Nicholes Stairs, MD;  Location: [place];  Service: Orthopedics;  Laterality: Right;  . HERNIA  REPAIR    . OOPHORECTOMY      There were no vitals filed for this visit.      Subjective Assessment - 08/01/16 1604    Subjective Pt stated she is doing good today, no reports of pain or recent falls.  Reports she hasn't done her HEP because "I'm lazy", stated she is coming to PT to get rid of walker, wishes to return to cane.     Pertinent History HTN, CVA March 2018, DM, R hip fractures, leukemia, R femur IM nail s/p fall 04/12/16   Currently in Pain? No/denies                         [facility] Adult PT Treatment/Exercise - 08/01/16 0001      Ambulation/Gait   Ambulation Distance (Feet) 226 Feet   Assistive device Small based quad cane   Gait Pattern Step-through pattern;Decreased stance time - right;Decreased stance time - left;Decreased stride length;Decreased dorsiflexion - right;Decreased dorsiflexion - left;Decreased weight shift to right;Shuffle;Antalgic;Narrow base of support   Ambulation Surface Level;Indoor   Gait Comments min guard/min A for correct form with gait with SBQC; significant unsteadiness noted      Knee/Hip Exercises: Aerobic   Nustep Nustep level 2 no hills UE/LE x8 minutes   SPM goal 40     Knee/Hip Exercises: Standing   Heel Raises Both;1 set;15 reps   Heel Raises Limitations heel and toe, B UEs  Forward Lunges Both;1 set;15 reps   Forward Lunges Limitations onto 4" step with 1 HHA   Lateral Step Up Both;Step Height: 4";Hand Hold: 1;10 reps   Lateral Step Up Limitations 1 UE assist   Forward Step Up Both;1 set;15 reps   Forward Step Up Limitations B UEs              Balance Exercises - 08/01/16 1619      Balance Exercises: Standing   Tandem Stance Eyes open;Foam/compliant surface;3 reps;30 secs  on foam   SLS 10 secs;Upper extremity support 1;3 reps  BLE SLS 3 sets x 10 w/ 1HHA   SLS with Vectors Solid surface;Upper extremity assist 1;2 reps  BLE 2 sets 5" holds with UE A             PT Short Term Goals - 07/15/16  1629      PT SHORT TERM GOAL #1   Title Patient to be able to identify 5/5 safety precautions in order to reduce fall risk and improve overall safety    Time 4   Period Weeks   Status New     PT SHORT TERM GOAL #2   Title Patient to be able to ambulate household distances with LRAD and no unsteadiness in order to improve mobility and independence    Time 4   Period Weeks   Status New     PT SHORT TERM GOAL #3   Title Patient to show improved gait mechanics including equal step lengths, improved gait speed, equal weight bearing B LEs, with LRAD in order to show improvement of general condition adn reduce compensation patterns related to injury and CVA    Time 4   Period Weeks   Status New     PT SHORT TERM GOAL #4   Title Patient to be able to correctly and consistently perform HEP at least 5 days per week, to be updated as appropriate    Time 1   Period Weeks   Status New           PT Long Term Goals - 07/15/16 1631      PT LONG TERM GOAL #1   Title Patient to show functional strength as being 4+/5 in order to improve mobilty and balance    Time 8   Period Weeks   Status New     PT LONG TERM GOAL #2   Title Patient to be able to ambulate household distances with no AD and no unsteadiness, as well as unlimited distances in community with LRAD and no unsteadiness, in order to show improved mobiltiy with minimal fall risk    Time 8   Period Weeks   Status New     PT LONG TERM GOAL #3   Title Patient to be able to complete TUG test in 15 seconds or less with LRAD in order to show improved balance and reduced fall risk    Time 8   Period Weeks   Status New     PT LONG TERM GOAL #4   Title Patient and family to report that she has safely been able to complete all functional strength and balance based tasks needed to be independent without safety concern in order to facilitate return to PLOF    Time [place] - 08/01/16  [place]  focus on improving functional strengthening and balance training.  Pt able to complete whole session with minimal rest but continues to move slow and unsteady requiring min guard/min A with activities.  Added vector stance for stability wtih HHA required for safety.  EOS with Nustep LE only for strengthening, encouraged pt keep SPM at 40 for endrance.  No reports of pain through session, was limted by fatigue.  Pt educated on importance and benefits of increasing frequency with HEP.     Rehab Potential Excellent   Clinical Impairments Affecting Rehab Potential (+) appears motivated, supportive family, high PLOF; (-) fall with serious injury, recent CVA   PT Frequency 2x / week   PT Duration 8 weeks   PT Treatment/Interventions ADLs/Self Care Home Management;Biofeedback;Cryotherapy;DME Instruction;Gait training;Stair training;Functional mobility training;Therapeutic activities;Therapeutic exercise;Balance training;Neuromuscular re-education;Patient/family education;Manual techniques;Energy conservation;Taping   PT Next Visit Plan continue functional strength for proximal muscles and in CKC psitions. Gait training with SBQC. Continue Nustep. BALANCE!     PT Home Exercise Plan Eval: bridges, supine hip ABD with red TB, sit to stand with UEs, regular walking program       Patient will benefit from skilled therapeutic intervention in order to improve the following deficits and impairments:  Abnormal gait, Decreased coordination, Postural dysfunction, Decreased mobility, Decreased activity tolerance, Decreased strength, Decreased balance, Decreased safety awareness, Difficulty walking  Visit Diagnosis: Muscle weakness (generalized)  Difficulty in walking, not elsewhere classified  Unsteadiness on feet  Other symptoms and signs involving the musculoskeletal system     Problem List Patient Active Problem List   Diagnosis Date Noted  . Constipation  07/10/2016  . Dysphagia 07/10/2016  . Loss of weight 07/10/2016  . Impaired glucose tolerance 06/05/2016  . Slurred speech   . CVA (cerebral vascular accident) ([place]) 06/03/2016  . Lightheadedness 04/13/2016  . Hypokalemia 04/13/2016  . Hyperglycemia 04/13/2016  . Essential hypertension 04/13/2016  . Hyperlipidemia 04/13/2016  . S/P right hip fracture 04/12/2016  . Hip fracture ([place]) 04/12/2016  . Fall   . Itching in the vaginal area 10/05/2014  . Chronic myelogenous leukemia (CML), BCR-ABL1-positive ([place])   . GERD (gastroesophageal reflux disease) 12/06/2013  . Leukocytosis 08/06/2013  . Diabetes type 2, uncontrolled ([place]) 08/06/2013  . Atypical chest pain 08/06/2013  . Chronic diarrhea 08/05/2013  . Pessary maintenance 07/01/2013   Ihor Austin, [place]; [place]  Aldona Lento 08/01/2016, 6:26 PM  [facility] [address] [place], [place], [address] Phone: [phone]   Fax:  [phone]  Name: April Page MRN: [id number] Date of Birth: Jan 16, 1946

## 2016-08-06 ENCOUNTER — Ambulatory Visit (HOSPITAL_COMMUNITY): Payer: Medicare HMO

## 2016-08-06 DIAGNOSIS — R29898 Other symptoms and signs involving the musculoskeletal system: Secondary | ICD-10-CM

## 2016-08-06 DIAGNOSIS — R278 Other lack of coordination: Secondary | ICD-10-CM

## 2016-08-06 DIAGNOSIS — R262 Difficulty in walking, not elsewhere classified: Secondary | ICD-10-CM

## 2016-08-06 DIAGNOSIS — R2681 Unsteadiness on feet: Secondary | ICD-10-CM

## 2016-08-06 DIAGNOSIS — M6281 Muscle weakness (generalized): Secondary | ICD-10-CM | POA: Diagnosis not present

## 2016-08-06 NOTE — Therapy (Signed)
[place], [place], [address] Phone: [phone]   Fax:  [phone]  Physical Therapy Treatment  Patient Details  Name: April Page MRN: [id number] Date of Birth: [birth year] Referring Provider: Nicholes Stairs   Encounter Date: 08/06/2016      PT End of Session - 08/06/16 1608    Visit Number 6   Number of Visits 17   Date for PT Re-Evaluation 08/12/16   Authorization Type [employer] H[employer]    Authorization Time Period 07/15/16 to 09/14/16   Authorization - Visit Number 6   Authorization - Number of Visits 10   PT Start Time 0626   PT Stop Time 1656   PT Time Calculation (min) 51 min   Equipment Utilized During Treatment Gait belt   Activity Tolerance Patient tolerated treatment well;No increased pain;Patient limited by fatigue   Behavior During Therapy [facility] for tasks assessed/performed      Past Medical History:  Diagnosis Date  . Arthritis    stiff knees  . Blood dyscrasia   . Cancer ([place])   . CVA (cerebral vascular accident) ([place])   . GERD (gastroesophageal reflux disease)   . H/O: CML (chronic myeloid leukemia)   . Hyperlipidemia   . Hypertension   . Obesity   . Pessary maintenance 07/01/2013    Past Surgical History:  Procedure Laterality Date  . ABDOMINAL HYSTERECTOMY    . CHOLECYSTECTOMY    . COLONOSCOPY  06/09/2002   RSW:NIOEVOJJ hemorrhoids; otherwise normal rectum, colon   . COLONOSCOPY N/A 08/25/2013   KKX:FGHWEXH diverticulosis. Single colonic polyp-removed  s/p segmental biopsy and stool sample. random colon bx negative. +benign leiomyoma.  . cystocele/rectocele repair  2009  . ESOPHAGOGASTRODUODENOSCOPY  06/09/2002   BZJ:IRCVEL upper gastrointestinal tract s/p  54-French Maloney dilator  . FEMUR IM NAIL Right 04/12/2016   Procedure: INTRAMEDULLARY (IM) NAIL FEMORAL;  Surgeon: Nicholes Stairs, MD;  Location: [place];  Service: Orthopedics;  Laterality: Right;  . HERNIA  REPAIR    . OOPHORECTOMY      There were no vitals filed for this visit.      Subjective Assessment - 08/06/16 1606    Subjective Pt entered dept with RW, no reoprts of pain or recent falls.  Pt reoprts she has not been compliant with HEP due to laziness.   Pertinent History HTN, CVA March 2018, DM, R hip fractures, leukemia, R femur IM nail s/p fall 04/12/16   Patient Stated Goals improve mobility, improve independence, improve balance    Currently in Pain? No/denies                         [facility] Adult PT Treatment/Exercise - 08/06/16 0001      Ambulation/Gait   Ambulation Distance (Feet) 452 Feet   Assistive device Straight cane   Gait Pattern Step-through pattern;Decreased stance time - right;Decreased stance time - left;Decreased stride length;Decreased dorsiflexion - right;Decreased dorsiflexion - left;Decreased weight shift to right;Shuffle;Antalgic;Narrow base of support   Ambulation Surface Level;Indoor   Gait Comments min guard/min A for correct form with gait with SPC; significant unsteadiness noted      Knee/Hip Exercises: Standing   Heel Raises Both;1 set;15 reps   Heel Raises Limitations heel and toe, B UEs    Hip Abduction 10 reps;Both;2 sets   Lateral Step Up Both;Step Height: 4";Hand Hold: 1;10 reps   Lateral Step Up Limitations 1 UE assist   Forward Step  Up Both;1 set;15 reps   Forward Step Up Limitations B UEs 4in   Other Standing Knee Exercises agility ladder 2RT to increase cadence and sequence with gait training             Balance Exercises - 08/06/16 1819      Balance Exercises: Standing   Tandem Stance Eyes open;Foam/compliant surface;3 reps;30 secs   SLS 10 secs;Upper extremity support 1;3 reps             PT Short Term Goals - 07/15/16 1629      PT SHORT TERM GOAL #1   Title Patient to be able to identify 5/5 safety precautions in order to reduce fall risk and improve overall safety    Time 4   Period Weeks   Status  New     PT SHORT TERM GOAL #2   Title Patient to be able to ambulate household distances with LRAD and no unsteadiness in order to improve mobility and independence    Time 4   Period Weeks   Status New     PT SHORT TERM GOAL #3   Title Patient to show improved gait mechanics including equal step lengths, improved gait speed, equal weight bearing B LEs, with LRAD in order to show improvement of general condition adn reduce compensation patterns related to injury and CVA    Time 4   Period Weeks   Status New     PT SHORT TERM GOAL #4   Title Patient to be able to correctly and consistently perform HEP at least 5 days per week, to be updated as appropriate    Time 1   Period Weeks   Status New           PT Long Term Goals - 07/15/16 1631      PT LONG TERM GOAL #1   Title Patient to show functional strength as being 4+/5 in order to improve mobilty and balance    Time 8   Period Weeks   Status New     PT LONG TERM GOAL #2   Title Patient to be able to ambulate household distances with no AD and no unsteadiness, as well as unlimited distances in community with LRAD and no unsteadiness, in order to show improved mobiltiy with minimal fall risk    Time 8   Period Weeks   Status New     PT LONG TERM GOAL #3   Title Patient to be able to complete TUG test in 15 seconds or less with LRAD in order to show improved balance and reduced fall risk    Time 8   Period Weeks   Status New     PT LONG TERM GOAL #4   Title Patient and family to report that she has safely been able to complete all functional strength and balance based tasks needed to be independent without safety concern in order to facilitate return to PLOF    Time [place] - 08/06/16 1820    Clinical Impression Statement Pt arrived with family member interested in pt.'s progress.  Continued session focus on functional strengthening, balance training and gait training  with LRAD.  Pt continues to ambulate very slowly though stable, min guard with constant cueing to increased stride length, increased cadence and improve posture.  Added agility ladder with gait training to increased step  length, min guard.  EOS with NuStep for activity tolerance, pt was limited by fatigue.  Reviewed importance of compliance with HEP.    Rehab Potential Excellent   Clinical Impairments Affecting Rehab Potential (+) appears motivated, supportive family, high PLOF; (-) fall with serious injury, recent CVA   PT Frequency 2x / week   PT Duration 8 weeks   PT Treatment/Interventions ADLs/Self Care Home Management;Biofeedback;Cryotherapy;DME Instruction;Gait training;Stair training;Functional mobility training;Therapeutic activities;Therapeutic exercise;Balance training;Neuromuscular re-education;Patient/family education;Manual techniques;Energy conservation;Taping   PT Next Visit Plan continue functional strength for proximal muscles and in CKC psitions. Gait training with SBQC. Continue Nustep. BALANCE!     PT Home Exercise Plan Eval: bridges, supine hip ABD with red TB, sit to stand with UEs, regular walking program       Patient will benefit from skilled therapeutic intervention in order to improve the following deficits and impairments:  Abnormal gait, Decreased coordination, Postural dysfunction, Decreased mobility, Decreased activity tolerance, Decreased strength, Decreased balance, Decreased safety awareness, Difficulty walking  Visit Diagnosis: Muscle weakness (generalized)  Difficulty in walking, not elsewhere classified  Unsteadiness on feet  Other lack of coordination  Other symptoms and signs involving the musculoskeletal system     Problem List Patient Active Problem List   Diagnosis Date Noted  . Constipation 07/10/2016  . Dysphagia 07/10/2016  . Loss of weight 07/10/2016  . Impaired glucose tolerance 06/05/2016  . Slurred speech   . CVA (cerebral vascular  accident) ([place]) 06/03/2016  . Lightheadedness 04/13/2016  . Hypokalemia 04/13/2016  . Hyperglycemia 04/13/2016  . Essential hypertension 04/13/2016  . Hyperlipidemia 04/13/2016  . S/P right hip fracture 04/12/2016  . Hip fracture ([place]) 04/12/2016  . Fall   . Itching in the vaginal area 10/05/2014  . Chronic myelogenous leukemia (CML), BCR-ABL1-positive ([place])   . GERD (gastroesophageal reflux disease) 12/06/2013  . Leukocytosis 08/06/2013  . Diabetes type 2, uncontrolled ([place]) 08/06/2013  . Atypical chest pain 08/06/2013  . Chronic diarrhea 08/05/2013  . Pessary maintenance 07/01/2013   Ihor Austin, [place]; [place]  Aldona Lento 08/06/2016, 6:29 PM  [facility] [address] [place], [place], [address] Phone: [phone]   Fax:  [phone]  Name: REYAH STREETER MRN: [id number] Date of Birth: [birth year]

## 2016-08-08 ENCOUNTER — Ambulatory Visit (HOSPITAL_COMMUNITY): Payer: Medicare HMO | Admitting: Physical Therapy

## 2016-08-08 DIAGNOSIS — M6281 Muscle weakness (generalized): Secondary | ICD-10-CM

## 2016-08-08 DIAGNOSIS — R2681 Unsteadiness on feet: Secondary | ICD-10-CM | POA: Diagnosis not present

## 2016-08-08 DIAGNOSIS — R29898 Other symptoms and signs involving the musculoskeletal system: Secondary | ICD-10-CM

## 2016-08-08 DIAGNOSIS — R278 Other lack of coordination: Secondary | ICD-10-CM

## 2016-08-08 DIAGNOSIS — R262 Difficulty in walking, not elsewhere classified: Secondary | ICD-10-CM | POA: Diagnosis not present

## 2016-08-08 NOTE — Therapy (Signed)
[place], [place], [address] Phone: [phone]   Fax:  [phone]  Physical Therapy Treatment (Re-Assessment)  Patient Details  Name: April Page MRN: [id number] Date of Birth: 1945/12/22 Referring Provider: Nicholes Stairs   Encounter Date: 08/08/2016      PT End of Session - 08/08/16 1712    Visit Number 7   Number of Visits 15   Date for PT Re-Evaluation 09/05/16   Authorization Type [employer] HMO  (G-codes done 7th visit)   Authorization Time Period 07/15/16 to 09/14/16   Authorization - Visit Number 7   Authorization - Number of Visits 17   PT Start Time 3329   PT Stop Time 1643   PT Time Calculation (min) 40 min   Activity Tolerance Patient tolerated treatment well   Behavior During Therapy [facility] for tasks assessed/performed      Past Medical History:  Diagnosis Date  . Arthritis    stiff knees  . Blood dyscrasia   . Cancer ([place])   . CVA (cerebral vascular accident) ([place])   . GERD (gastroesophageal reflux disease)   . H/O: CML (chronic myeloid leukemia)   . Hyperlipidemia   . Hypertension   . Obesity   . Pessary maintenance 07/01/2013    Past Surgical History:  Procedure Laterality Date  . ABDOMINAL HYSTERECTOMY    . CHOLECYSTECTOMY    . COLONOSCOPY  06/09/2002   JJO:ACZYSAYT hemorrhoids; otherwise normal rectum, colon   . COLONOSCOPY N/A 08/25/2013   KZS:WFUXNAT diverticulosis. Single colonic polyp-removed  s/p segmental biopsy and stool sample. random colon bx negative. +benign leiomyoma.  . cystocele/rectocele repair  2009  . ESOPHAGOGASTRODUODENOSCOPY  06/09/2002   FTD:DUKGUR upper gastrointestinal tract s/p  54-French Maloney dilator  . FEMUR IM NAIL Right 04/12/2016   Procedure: INTRAMEDULLARY (IM) NAIL FEMORAL;  Surgeon: Nicholes Stairs, MD;  Location: [facility];  Service: Orthopedics;  Laterality: Right;  . HERNIA REPAIR    . OOPHORECTOMY      There were no vitals  filed for this visit.      Subjective Assessment - 08/08/16 1608    Subjective Patient arrives today stating that she feels like she is getting better. It is easier for her to get up and fix her lunch. Walking is somewhat easier. Her right kneecap has been botehring her. She does not do her exercises due to "lazyness", her daughter makes her do them.    Pertinent History HTN, CVA March 2018, DM, R hip fractures, leukemia, R femur IM nail s/p fall 04/12/16   How long can you sit comfortably? 5/31- unlimited    How long can you stand comfortably? 5/31- unlimited    How long can you walk comfortably? 5/31- not sure    Patient Stated Goals improve mobility, improve independence, improve balance    Currently in Pain? Yes   Pain Score 5    Pain Location Knee   Pain Orientation Right;Anterior   Pain Descriptors / Indicators Sore   Pain Type Acute pain   Pain Radiating Towards none    Pain Onset In the past 7 days   Pain Frequency Intermittent   Aggravating Factors  exercise    Pain Relieving Factors don't exercise    Effect of Pain on Daily Activities none             [facility] PT Assessment - 08/08/16 0001      Strength   Right Hip Flexion 4/5  Right Hip Extension 2/5   Right Hip ABduction 3+/5   Left Hip Flexion 4/5   Left Hip Extension 2/5   Left Hip ABduction 4/5   Right Knee Extension 5/5   Left Knee Extension 5/5   Right Ankle Dorsiflexion 5/5   Left Ankle Dorsiflexion 5/5     6 minute walk test results    Aerobic Endurance Distance Walked 254   Endurance additional comments 3MWT, FWW      Timed Up and Go Test   Normal TUG (seconds) 32  FWW                           Balance Exercises - 08/08/16 1708      Balance Exercises: Standing   Standing Eyes Opened Narrow base of support (BOS);Solid surface;1 rep;Other (comment)  60 seconds    Retro Gait Other (comment)  short distance inside parallel bars min guard    Other Standing Exercises standing  with one foot forward each way x60 seconds; standing with feet shoulder width apart x60 seconds            PT Education - 08/08/16 1711    Education provided Yes   Education Details progress with skilled PT services, POC moving forward, importance of HEP compliance/adherence    Person(s) Educated Patient   Methods Explanation   Comprehension Verbalized understanding          PT Short Term Goals - 08/08/16 1627      PT SHORT TERM GOAL #1   Title Patient to be able to identify 5/5 safety precautions in order to reduce fall risk and improve overall safety    Baseline 5/31- 0/5, discussed    Time 4   Period Weeks   Status On-going     PT SHORT TERM GOAL #2   Title Patient to be able to ambulate household distances with LRAD and no unsteadiness in order to improve mobility and independence    Baseline 5/31- still using walker, not confident with cane yet    Time 4   Period Weeks   Status On-going     PT SHORT TERM GOAL #3   Title Patient to show improved gait mechanics including equal step lengths, improved gait speed, equal weight bearing B LEs, with LRAD in order to show improvement of general condition adn reduce compensation patterns related to injury and CVA    Baseline 5/31- improving    Time 4   Period Weeks   Status On-going     PT SHORT TERM GOAL #4   Title Patient to be able to correctly and consistently perform HEP at least 5 days per week, to be updated as appropriate    Baseline 5/31- 3x/week    Time 1   Period Weeks   Status On-going           PT Long Term Goals - 08/08/16 1630      PT LONG TERM GOAL #1   Title Patient to show functional strength as being 4+/5 in order to improve mobilty and balance    Time 8   Period Weeks   Status On-going     PT LONG TERM GOAL #2   Title Patient to be able to ambulate household distances with no AD and no unsteadiness, as well as unlimited distances in community with LRAD and no unsteadiness, in order to show  improved mobiltiy with minimal fall risk    Time 8  Period Weeks   Status On-going     PT LONG TERM GOAL #3   Title Patient to be able to complete TUG test in 15 seconds or less with LRAD in order to show improved balance and reduced fall risk    Baseline 08-27-22- 32   Time 8   Period Weeks   Status On-going     PT LONG TERM GOAL #4   Title Patient and family to report that she has safely been able to complete all functional strength and balance based tasks needed to be independent without safety concern in order to facilitate return to PLOF    Baseline 08-27-22- ongoing                Plan - 08-26-16 1712    Clinical Impression Statement Re-assessment performed today. Patient appears to be making slow but steady progress with skilled PT services, and shows improvement in all objective measures taken this session. She does, however, demonstrate poor functional balance, reduced muscle strength, gait impairment, and presents as generally a fairly high fall risk at this time. She admits she does not do her HEP much unless her daughter makes her but voices agreement/understanding of education regarding HEP compliance given today. Recommend continuation of skilled PT services to continue addressing functional deficits, to reduce fall risk, and assist in return to optimal safe level of function moving forward.    Rehab Potential Excellent   Clinical Impairments Affecting Rehab Potential (+) appears motivated, supportive family, high PLOF; (-) fall with serious injury, recent CVA   PT Frequency 2x / week   PT Duration 4 weeks   PT Treatment/Interventions ADLs/Self Care Home Management;Biofeedback;Cryotherapy;DME Instruction;Gait training;Stair training;Functional mobility training;Therapeutic activities;Therapeutic exercise;Balance training;Neuromuscular re-education;Patient/family education;Manual techniques;Energy conservation;Taping   PT Next Visit Plan continue functional strengthening and  increase focus on balance training. Need to emphasize improved balance BEFORE pressing independence with quad cane due to fall risk!    PT Home Exercise Plan Eval: bridges, supine hip ABD with red TB, sit to stand with UEs, regular walking program    Consulted and Agree with Plan of Care Patient      Patient will benefit from skilled therapeutic intervention in order to improve the following deficits and impairments:  Abnormal gait, Decreased coordination, Postural dysfunction, Decreased mobility, Decreased activity tolerance, Decreased strength, Decreased balance, Decreased safety awareness, Difficulty walking  Visit Diagnosis: Muscle weakness (generalized)  Difficulty in walking, not elsewhere classified  Unsteadiness on feet  Other lack of coordination  Other symptoms and signs involving the musculoskeletal system       G-Codes - 2016-08-26 1714    Functional Assessment Tool Used (Outpatient Only) Based on skilled clinical assessment of strength, balance, gait pattern, fall risk    Functional Limitation Mobility: Walking and moving around   Mobility: Walking and Moving Around Current Status ([id number]) At least 40 percent but less than 60 percent impaired, limited or restricted   Mobility: Walking and Moving Around Goal Status [phone]) At least 20 percent but less than 40 percent impaired, limited or restricted      Problem List Patient Active Problem List   Diagnosis Date Noted  . Constipation 07/10/2016  . Dysphagia 07/10/2016  . Loss of weight 07/10/2016  . Impaired glucose tolerance 06/05/2016  . Slurred speech   . CVA (cerebral vascular accident) ([place]) 06/03/2016  . Lightheadedness 04/13/2016  . Hypokalemia 04/13/2016  . Hyperglycemia 04/13/2016  . Essential hypertension 04/13/2016  . Hyperlipidemia 04/13/2016  . S/P right hip fracture  04/12/2016  . Hip fracture ([place]) 04/12/2016  . Fall   . Itching in the vaginal area 10/05/2014  . Chronic myelogenous leukemia (CML),  BCR-ABL1-positive ([place])   . GERD (gastroesophageal reflux disease) 12/06/2013  . Leukocytosis 08/06/2013  . Diabetes type 2, uncontrolled ([place]) 08/06/2013  . Atypical chest pain 08/06/2013  . Chronic diarrhea 08/05/2013  . Pessary maintenance 07/01/2013   Deniece Ree PT, DPT [facility] [address] [place], [place], [address] Phone: [phone]   Fax:  [phone]  Name: April Page MRN: [id number] Date of Birth: 1946-02-22

## 2016-08-10 NOTE — Addendum Note (Signed)
Addendum  created 08/10/16 0901 by Duane Boston, MD   Sign clinical note

## 2016-08-12 ENCOUNTER — Ambulatory Visit (HOSPITAL_COMMUNITY): Payer: Medicare HMO | Attending: Orthopedic Surgery | Admitting: Physical Therapy

## 2016-08-12 DIAGNOSIS — R2681 Unsteadiness on feet: Secondary | ICD-10-CM | POA: Insufficient documentation

## 2016-08-12 DIAGNOSIS — R29898 Other symptoms and signs involving the musculoskeletal system: Secondary | ICD-10-CM | POA: Insufficient documentation

## 2016-08-12 DIAGNOSIS — M6281 Muscle weakness (generalized): Secondary | ICD-10-CM | POA: Diagnosis not present

## 2016-08-12 DIAGNOSIS — R262 Difficulty in walking, not elsewhere classified: Secondary | ICD-10-CM | POA: Diagnosis not present

## 2016-08-12 DIAGNOSIS — R278 Other lack of coordination: Secondary | ICD-10-CM

## 2016-08-12 NOTE — Therapy (Signed)
[place], [place], [address] Phone: [phone]   Fax:  [phone]  Physical Therapy Treatment  Patient Details  Name: April Page MRN: [id number] Date of Birth: 06/22/1945 Referring Provider: Nicholes Stairs   Encounter Date: 08/12/2016      PT End of Session - 08/12/16 1700    Visit Number 8   Number of Visits 15   Date for PT Re-Evaluation 09/05/16   Authorization Type [employer]  (G-codes done 7th visit)   Authorization Time Period 07/15/16 to 09/14/16   Authorization - Visit Number 8   Authorization - Number of Visits 17   PT Start Time 1601   PT Stop Time 1650   PT Time Calculation (min) 43 min   Equipment Utilized During Treatment Gait belt   Activity Tolerance Patient tolerated treatment well   Behavior During Therapy [facility] for tasks assessed/performed      Past Medical History:  Diagnosis Date  . Arthritis    stiff knees  . Blood dyscrasia   . Cancer ([place])   . CVA (cerebral vascular accident) ([place])   . GERD (gastroesophageal reflux disease)   . H/O: CML (chronic myeloid leukemia)   . Hyperlipidemia   . Hypertension   . Obesity   . Pessary maintenance 07/01/2013    Past Surgical History:  Procedure Laterality Date  . ABDOMINAL HYSTERECTOMY    . CHOLECYSTECTOMY    . COLONOSCOPY  06/09/2002   UXN:ATFTDDUK hemorrhoids; otherwise normal rectum, colon   . COLONOSCOPY N/A 08/25/2013   GUR:KYHCWCB diverticulosis. Single colonic polyp-removed  s/p segmental biopsy and stool sample. random colon bx negative. +benign leiomyoma.  . cystocele/rectocele repair  2009  . ESOPHAGOGASTRODUODENOSCOPY  06/09/2002   JSE:GBTDVV upper gastrointestinal tract s/p  54-French Maloney dilator  . FEMUR IM NAIL Right 04/12/2016   Procedure: INTRAMEDULLARY (IM) NAIL FEMORAL;  Surgeon: Nicholes Stairs, MD;  Location: [facility];  Service: Orthopedics;  Laterality: Right;  . HERNIA REPAIR    . OOPHORECTOMY       There were no vitals filed for this visit.      Subjective Assessment - 08/12/16 1705    Subjective Patient arrives today stating no major changes, her R knee continues to bother her sometimes    Pertinent History HTN, CVA March 2018, DM, R hip fractures, leukemia, R femur IM nail s/p fall 04/12/16   Patient Stated Goals improve mobility, improve independence, improve balance    Currently in Pain? No/denies                         [facility] Adult PT Treatment/Exercise - 08/12/16 0001      Ambulation/Gait   Ambulation Distance (Feet) 144 Feet   Assistive device Small based quad cane   Gait Pattern Step-through pattern;Decreased stance time - right;Decreased stance time - left;Decreased stride length;Decreased dorsiflexion - right;Decreased dorsiflexion - left;Decreased weight shift to right;Shuffle;Antalgic;Narrow base of support   Ambulation Surface Level;Indoor   Gait Comments min guard; ongoing difficulty with sequence noted, general unsteadiness requiring min guard      Knee/Hip Exercises: Standing   Heel Raises Both;1 set;20 reps   Heel Raises Limitations heel and toe    Forward Step Up Both;1 set;10 reps   Forward Step Up Limitations 6 inch box B UEs    Gait Training hurdle navigation with B UE support 3 rounds down and back   forwards and lateral  Other Standing Knee Exercises standing marches 1x10 B UEs      Knee/Hip Exercises: Seated   Sit to Sand 10 reps;without UE support;2 sets;5 reps  1x10, then 1x5 no UEs              Balance Exercises - 08/12/16 1627      Balance Exercises: Standing   Tandem Stance Eyes open;3 reps;20 secs;Foam/compliant surface   Other Standing Exercises attempted cone taps but unalbe to tolerance extended time with this task due to R LE weakness; teo taps on 2 inch box no UEs            PT Education - 08/12/16 1659    Education provided Yes   Education Details they may practice SBQC at home but patient will  need family member with her as she is still unsteady, not safe to use quad cane on her own yet due to fall risk    Person(s) Educated Patient, daughter    Methods Explanation   Comprehension Verbalized understanding          PT Short Term Goals - 08/08/16 1627      PT SHORT TERM GOAL #1   Title Patient to be able to identify 5/5 safety precautions in order to reduce fall risk and improve overall safety    Baseline 5/31- 0/5, discussed    Time 4   Period Weeks   Status On-going     PT SHORT TERM GOAL #2   Title Patient to be able to ambulate household distances with LRAD and no unsteadiness in order to improve mobility and independence    Baseline 5/31- still using walker, not confident with cane yet    Time 4   Period Weeks   Status On-going     PT SHORT TERM GOAL #3   Title Patient to show improved gait mechanics including equal step lengths, improved gait speed, equal weight bearing B LEs, with LRAD in order to show improvement of general condition adn reduce compensation patterns related to injury and CVA    Baseline 5/31- improving    Time 4   Period Weeks   Status On-going     PT SHORT TERM GOAL #4   Title Patient to be able to correctly and consistently perform HEP at least 5 days per week, to be updated as appropriate    Baseline 5/31- 3x/week    Time 1   Period Weeks   Status On-going           PT Long Term Goals - 08/08/16 1630      PT LONG TERM GOAL #1   Title Patient to show functional strength as being 4+/5 in order to improve mobilty and balance    Time 8   Period Weeks   Status On-going     PT LONG TERM GOAL #2   Title Patient to be able to ambulate household distances with no AD and no unsteadiness, as well as unlimited distances in community with LRAD and no unsteadiness, in order to show improved mobiltiy with minimal fall risk    Time 8   Period Weeks   Status On-going     PT LONG TERM GOAL #3   Title Patient to be able to complete TUG test  in 15 seconds or less with LRAD in order to show improved balance and reduced fall risk    Baseline 5/31- 32   Time 8   Period Weeks   Status On-going  PT LONG TERM GOAL #4   Title Patient and family to report that she has safely been able to complete all functional strength and balance based tasks needed to be independent without safety concern in order to facilitate return to PLOF    Baseline 5/31- ongoing                Plan - 08/12/16 1702    Clinical Impression Statement Continued with general focus on strength and balance today, progressing all activities as able this session. Patient continues to demonstrate generalized functional weakness but remains motivated to continue with PT, she will also require ongoing intensive balance training to improve safety and ability to use quad cane moving forward as she does continue to demonstrate some difficulty with sequencing with cane as well as some unsteadiness during gait with cane today.    Clinical Impairments Affecting Rehab Potential (+) appears motivated, supportive family, high PLOF; (-) fall with serious injury, recent CVA   PT Frequency 2x / week   PT Duration 4 weeks   PT Treatment/Interventions ADLs/Self Care Home Management;Biofeedback;Cryotherapy;DME Instruction;Gait training;Stair training;Functional mobility training;Therapeutic activities;Therapeutic exercise;Balance training;Neuromuscular re-education;Patient/family education;Manual techniques;Energy conservation;Taping   PT Next Visit Plan continue progressing functional strength, progress balance. Need to continue working on safety and sequencing with cane, she is not ready to be independent with it yet    PT Home Exercise Plan Eval: bridges, supine hip ABD with red TB, sit to stand with UEs, regular walking program    Consulted and Agree with Plan of Care Patient      Patient will benefit from skilled therapeutic intervention in order to improve the following  deficits and impairments:  Abnormal gait, Decreased coordination, Postural dysfunction, Decreased mobility, Decreased activity tolerance, Decreased strength, Decreased balance, Decreased safety awareness, Difficulty walking  Visit Diagnosis: Muscle weakness (generalized)  Difficulty in walking, not elsewhere classified  Unsteadiness on feet  Other lack of coordination  Other symptoms and signs involving the musculoskeletal system     Problem List Patient Active Problem List   Diagnosis Date Noted  . Constipation 07/10/2016  . Dysphagia 07/10/2016  . Loss of weight 07/10/2016  . Impaired glucose tolerance 06/05/2016  . Slurred speech   . CVA (cerebral vascular accident) ([place]) 06/03/2016  . Lightheadedness 04/13/2016  . Hypokalemia 04/13/2016  . Hyperglycemia 04/13/2016  . Essential hypertension 04/13/2016  . Hyperlipidemia 04/13/2016  . S/P right hip fracture 04/12/2016  . Hip fracture ([place]) 04/12/2016  . Fall   . Itching in the vaginal area 10/05/2014  . Chronic myelogenous leukemia (CML), BCR-ABL1-positive ([place])   . GERD (gastroesophageal reflux disease) 12/06/2013  . Leukocytosis 08/06/2013  . Diabetes type 2, uncontrolled ([place]) 08/06/2013  . Atypical chest pain 08/06/2013  . Chronic diarrhea 08/05/2013  . Pessary maintenance 07/01/2013    Deniece Ree PT, DPT [facility] [address] [place], [place], [address] Phone: [phone]   Fax:  [phone]  Name: April Page MRN: [id number] Date of Birth: [birth year]

## 2016-08-14 ENCOUNTER — Ambulatory Visit (HOSPITAL_COMMUNITY): Payer: Medicare HMO | Admitting: Physical Therapy

## 2016-08-14 DIAGNOSIS — M6281 Muscle weakness (generalized): Secondary | ICD-10-CM | POA: Diagnosis not present

## 2016-08-14 DIAGNOSIS — R2681 Unsteadiness on feet: Secondary | ICD-10-CM | POA: Diagnosis not present

## 2016-08-14 DIAGNOSIS — R262 Difficulty in walking, not elsewhere classified: Secondary | ICD-10-CM | POA: Diagnosis not present

## 2016-08-14 DIAGNOSIS — R29898 Other symptoms and signs involving the musculoskeletal system: Secondary | ICD-10-CM | POA: Diagnosis not present

## 2016-08-14 DIAGNOSIS — R278 Other lack of coordination: Secondary | ICD-10-CM

## 2016-08-14 NOTE — Therapy (Signed)
[place], [place], [address] Phone: [phone]   Fax:  [phone]  Physical Therapy Treatment  Patient Details  Name: April Page MRN: [id number] Date of Birth: 1945/05/22 Referring Provider: Nicholes Stairs   Encounter Date: 08/14/2016      PT End of Session - 08/14/16 1645    Visit Number 9   Number of Visits 15   Date for PT Re-Evaluation 09/05/16   Authorization Type [employer] H[employer]  (G-codes done 7th visit)   Authorization Time Period 07/15/16 to 09/14/16   Authorization - Visit Number 9   Authorization - Number of Visits 17   PT Start Time 6962   PT Stop Time 1643   PT Time Calculation (min) 40 min   Equipment Utilized During Treatment Gait belt   Activity Tolerance Patient tolerated treatment well   Behavior During Therapy [facility] for tasks assessed/performed      Past Medical History:  Diagnosis Date  . Arthritis    stiff knees  . Blood dyscrasia   . Cancer ([place])   . CVA (cerebral vascular accident) ([place])   . GERD (gastroesophageal reflux disease)   . H/O: CML (chronic myeloid leukemia)   . Hyperlipidemia   . Hypertension   . Obesity   . Pessary maintenance 07/01/2013    Past Surgical History:  Procedure Laterality Date  . ABDOMINAL HYSTERECTOMY    . CHOLECYSTECTOMY    . COLONOSCOPY  06/09/2002   XBM:[id number] hemorrhoids; otherwise normal rectum, colon   . COLONOSCOPY N/A 08/25/2013   NUU:VOZDGUY diverticulosis. Single colonic polyp-removed  s/p segmental biopsy and stool sample. random colon bx negative. +benign leiomyoma.  . cystocele/rectocele repair  2009  . ESOPHAGOGASTRODUODENOSCOPY  06/09/2002   QIH:KVQQVZ upper gastrointestinal tract s/p  54-French Maloney dilator  . FEMUR IM NAIL Right 04/12/2016   Procedure: INTRAMEDULLARY (IM) NAIL FEMORAL;  Surgeon: Nicholes Stairs, MD;  Location: [facility];  Service: Orthopedics;  Laterality: Right;  . HERNIA REPAIR    . OOPHORECTOMY       There were no vitals filed for this visit.      Subjective Assessment - 08/14/16 1607    Subjective Patient arrives today doing well, no changes since last time    Pertinent History HTN, CVA March 2018, DM, R hip fractures, leukemia, R femur IM nail s/p fall 04/12/16   Patient Stated Goals improve mobility, improve independence, improve balance    Currently in Pain? No/denies                         [facility] Adult PT Treatment/Exercise - 08/14/16 0001      Ambulation/Gait   Ambulation Distance (Feet) 80 Feet   Assistive device Small based quad cane   Gait Pattern Step-through pattern;Decreased stance time - right;Decreased stance time - left;Decreased stride length;Decreased dorsiflexion - right;Decreased dorsiflexion - left;Decreased weight shift to right;Shuffle;Antalgic;Narrow base of support   Ambulation Surface Level;Indoor   Gait Comments min guard, general unsteadiness and poor sequencing noted     Knee/Hip Exercises: Standing   Heel Raises Both;1 set;20 reps   Heel Raises Limitations heel and toe    Forward Lunges Both;1 set;10 reps   Forward Lunges Limitations 4 inch box    Forward Step Up Both;1 set;15 reps   Forward Step Up Limitations 4 inch box U UE    Functional Squat 1 set;10 reps;Other (comment)  mini squats, cues for form  Balance Exercises - 08/14/16 1623      Balance Exercises: Standing   Tandem Stance Eyes closed;Foam/compliant surface;2 reps;25 secs  B UEs holding object- more steady    SLS Eyes open;Solid surface;15 secs;Modified  B UEs holding object            PT Education - 08/14/16 1645    Education provided Yes   Education Details use walker for safety    Person(s) Educated Patient   Methods Explanation   Comprehension Verbalized understanding          PT Short Term Goals - 08/08/16 1627      PT SHORT TERM GOAL #1   Title Patient to be able to identify 5/5 safety precautions in order to reduce  fall risk and improve overall safety    Baseline 5/31- 0/5, discussed    Time 4   Period Weeks   Status On-going     PT SHORT TERM GOAL #2   Title Patient to be able to ambulate household distances with LRAD and no unsteadiness in order to improve mobility and independence    Baseline 5/31- still using walker, not confident with cane yet    Time 4   Period Weeks   Status On-going     PT SHORT TERM GOAL #3   Title Patient to show improved gait mechanics including equal step lengths, improved gait speed, equal weight bearing B LEs, with LRAD in order to show improvement of general condition adn reduce compensation patterns related to injury and CVA    Baseline 5/31- improving    Time 4   Period Weeks   Status On-going     PT SHORT TERM GOAL #4   Title Patient to be able to correctly and consistently perform HEP at least 5 days per week, to be updated as appropriate    Baseline 5/31- 3x/week    Time 1   Period Weeks   Status On-going           PT Long Term Goals - 08/08/16 1630      PT LONG TERM GOAL #1   Title Patient to show functional strength as being 4+/5 in order to improve mobilty and balance    Time 8   Period Weeks   Status On-going     PT LONG TERM GOAL #2   Title Patient to be able to ambulate household distances with no AD and no unsteadiness, as well as unlimited distances in community with LRAD and no unsteadiness, in order to show improved mobiltiy with minimal fall risk    Time 8   Period Weeks   Status On-going     PT LONG TERM GOAL #3   Title Patient to be able to complete TUG test in 15 seconds or less with LRAD in order to show improved balance and reduced fall risk    Baseline 5/31- 32   Time 8   Period Weeks   Status On-going     PT LONG TERM GOAL #4   Title Patient and family to report that she has safely been able to complete all functional strength and balance based tasks needed to be independent without safety concern in order to facilitate  return to PLOF    Baseline 5/31- ongoing                Plan - 08/14/16 1646    Clinical Impression Statement Patient arrives in good spirits today, reporting she is doing well. Continued generally with  progression of functional strength and balance moving forward, as well as practice ambulating with quad cane today. Patient continues to do well with skilled PT services and tolerated all activities well this session. Some progressions of strengthening activities are limited by R patellar pain- will continue to monitor moving forward.    Rehab Potential Excellent   PT Frequency 2x / week   PT Duration 4 weeks   PT Treatment/Interventions ADLs/Self Care Home Management;Biofeedback;Cryotherapy;DME Instruction;Gait training;Stair training;Functional mobility training;Therapeutic activities;Therapeutic exercise;Balance training;Neuromuscular re-education;Patient/family education;Manual techniques;Energy conservation;Taping   PT Next Visit Plan progress as tolerated/able, monitor R patella pain. BALANCE. ongonig training with cane.    PT Home Exercise Plan Eval: bridges, supine hip ABD with red TB, sit to stand with UEs, regular walking program    Consulted and Agree with Plan of Care Patient      Patient will benefit from skilled therapeutic intervention in order to improve the following deficits and impairments:  Abnormal gait, Decreased coordination, Postural dysfunction, Decreased mobility, Decreased activity tolerance, Decreased strength, Decreased balance, Decreased safety awareness, Difficulty walking  Visit Diagnosis: Muscle weakness (generalized)  Difficulty in walking, not elsewhere classified  Unsteadiness on feet  Other lack of coordination  Other symptoms and signs involving the musculoskeletal system     Problem List Patient Active Problem List   Diagnosis Date Noted  . Constipation 07/10/2016  . Dysphagia 07/10/2016  . Loss of weight 07/10/2016  . Impaired  glucose tolerance 06/05/2016  . Slurred speech   . CVA (cerebral vascular accident) ([place]) 06/03/2016  . Lightheadedness 04/13/2016  . Hypokalemia 04/13/2016  . Hyperglycemia 04/13/2016  . Essential hypertension 04/13/2016  . Hyperlipidemia 04/13/2016  . S/P right hip fracture 04/12/2016  . Hip fracture ([place]) 04/12/2016  . Fall   . Itching in the vaginal area 10/05/2014  . Chronic myelogenous leukemia (CML), BCR-ABL1-positive ([place])   . GERD (gastroesophageal reflux disease) 12/06/2013  . Leukocytosis 08/06/2013  . Diabetes type 2, uncontrolled ([place]) 08/06/2013  . Atypical chest pain 08/06/2013  . Chronic diarrhea 08/05/2013  . Pessary maintenance 07/01/2013    Deniece Ree PT, DPT [facility] [address] [place], [place], [address] Phone: [phone]   Fax:  [phone]  Name: April Page MRN: [id number] Date of Birth: 02/13/1946

## 2016-08-16 ENCOUNTER — Inpatient Hospital Stay (HOSPITAL_COMMUNITY)
Admission: EM | Admit: 2016-08-16 | Discharge: 2016-08-18 | DRG: 065 | Disposition: A | Discharge status: Home / Self Care | Payer: Medicare HMO | Attending: Family Medicine | Admitting: Family Medicine

## 2016-08-16 ENCOUNTER — Encounter (HOSPITAL_COMMUNITY): Payer: Self-pay

## 2016-08-16 ENCOUNTER — Emergency Department (HOSPITAL_COMMUNITY): Payer: Medicare HMO

## 2016-08-16 DIAGNOSIS — Z79899 Other long term (current) drug therapy: Secondary | ICD-10-CM | POA: Diagnosis not present

## 2016-08-16 DIAGNOSIS — C9210 Chronic myeloid leukemia, BCR/ABL-positive, not having achieved remission: Secondary | ICD-10-CM | POA: Diagnosis not present

## 2016-08-16 DIAGNOSIS — R4702 Dysphasia: Secondary | ICD-10-CM | POA: Diagnosis not present

## 2016-08-16 DIAGNOSIS — Z7902 Long term (current) use of antithrombotics/antiplatelets: Secondary | ICD-10-CM | POA: Diagnosis not present

## 2016-08-16 DIAGNOSIS — I69322 Dysarthria following cerebral infarction: Secondary | ICD-10-CM

## 2016-08-16 DIAGNOSIS — I639 Cerebral infarction, unspecified: Secondary | ICD-10-CM | POA: Diagnosis not present

## 2016-08-16 DIAGNOSIS — K5730 Diverticulosis of large intestine without perforation or abscess without bleeding: Secondary | ICD-10-CM | POA: Diagnosis present

## 2016-08-16 DIAGNOSIS — N390 Urinary tract infection, site not specified: Secondary | ICD-10-CM | POA: Diagnosis present

## 2016-08-16 DIAGNOSIS — E785 Hyperlipidemia, unspecified: Secondary | ICD-10-CM | POA: Diagnosis not present

## 2016-08-16 DIAGNOSIS — L8990 Pressure ulcer of unspecified site, unspecified stage: Secondary | ICD-10-CM | POA: Insufficient documentation

## 2016-08-16 DIAGNOSIS — IMO0002 Reserved for concepts with insufficient information to code with codable children: Secondary | ICD-10-CM | POA: Diagnosis present

## 2016-08-16 DIAGNOSIS — R1310 Dysphagia, unspecified: Secondary | ICD-10-CM | POA: Diagnosis present

## 2016-08-16 DIAGNOSIS — Z7982 Long term (current) use of aspirin: Secondary | ICD-10-CM | POA: Diagnosis not present

## 2016-08-16 DIAGNOSIS — Z90710 Acquired absence of both cervix and uterus: Secondary | ICD-10-CM

## 2016-08-16 DIAGNOSIS — G459 Transient cerebral ischemic attack, unspecified: Secondary | ICD-10-CM | POA: Diagnosis not present

## 2016-08-16 DIAGNOSIS — I6302 Cerebral infarction due to thrombosis of basilar artery: Secondary | ICD-10-CM

## 2016-08-16 DIAGNOSIS — I69391 Dysphagia following cerebral infarction: Secondary | ICD-10-CM

## 2016-08-16 DIAGNOSIS — E119 Type 2 diabetes mellitus without complications: Secondary | ICD-10-CM | POA: Diagnosis not present

## 2016-08-16 DIAGNOSIS — R4781 Slurred speech: Secondary | ICD-10-CM | POA: Diagnosis not present

## 2016-08-16 DIAGNOSIS — R29704 NIHSS score 4: Secondary | ICD-10-CM | POA: Diagnosis present

## 2016-08-16 DIAGNOSIS — R061 Stridor: Secondary | ICD-10-CM | POA: Diagnosis not present

## 2016-08-16 DIAGNOSIS — K219 Gastro-esophageal reflux disease without esophagitis: Secondary | ICD-10-CM | POA: Diagnosis present

## 2016-08-16 DIAGNOSIS — R531 Weakness: Secondary | ICD-10-CM | POA: Diagnosis not present

## 2016-08-16 DIAGNOSIS — E1165 Type 2 diabetes mellitus with hyperglycemia: Secondary | ICD-10-CM | POA: Diagnosis present

## 2016-08-16 DIAGNOSIS — I6789 Other cerebrovascular disease: Secondary | ICD-10-CM | POA: Diagnosis not present

## 2016-08-16 DIAGNOSIS — I10 Essential (primary) hypertension: Secondary | ICD-10-CM | POA: Diagnosis not present

## 2016-08-16 DIAGNOSIS — R4789 Other speech disturbances: Secondary | ICD-10-CM | POA: Diagnosis not present

## 2016-08-16 LAB — BASIC METABOLIC PANEL
Anion gap: 8 (ref 5–15)
BUN: 13 mg/dL (ref 6–20)
CALCIUM: 9.6 mg/dL (ref 8.9–10.3)
CHLORIDE: 107 mmol/L (ref 101–111)
CO2: 25 mmol/L (ref 22–32)
CREATININE: 1.02 mg/dL — AB (ref 0.44–1.00)
GFR, EST NON AFRICAN AMERICAN: 54 mL/min — AB (ref >60)
Glucose, Bld: 119 mg/dL — ABNORMAL HIGH (ref 65–99)
Potassium: 3.6 mmol/L (ref 3.5–5.1)
SODIUM: 140 mmol/L (ref 135–145)

## 2016-08-16 LAB — CBC WITH DIFFERENTIAL/PLATELET
BASOS PCT: 0 %
Basophils Absolute: 0 10*3/uL (ref 0.0–0.1)
Eosinophils Absolute: 0.1 10*3/uL (ref 0.0–0.7)
Eosinophils Relative: 2 %
HCT: 35.7 % — ABNORMAL LOW (ref 36.0–46.0)
HEMOGLOBIN: 11.4 g/dL — AB (ref 12.0–15.0)
Lymphocytes Relative: 37 %
Lymphs Abs: 2.5 10*3/uL (ref 0.7–4.0)
MCH: 26.8 pg (ref 26.0–34.0)
MCHC: 31.9 g/dL (ref 30.0–36.0)
MCV: 83.8 fL (ref 78.0–100.0)
MONOS PCT: 7 %
Monocytes Absolute: 0.5 10*3/uL (ref 0.1–1.0)
NEUTROS PCT: 54 %
Neutro Abs: 3.7 10*3/uL (ref 1.7–7.7)
PLATELETS: 272 10*3/uL (ref 150–400)
RBC: 4.26 MIL/uL (ref 3.87–5.11)
RDW: 17 % — ABNORMAL HIGH (ref 11.5–15.5)
WBC: 6.7 10*3/uL (ref 4.0–10.5)

## 2016-08-16 LAB — URINALYSIS, ROUTINE W REFLEX MICROSCOPIC
Bilirubin Urine: NEGATIVE
GLUCOSE, UA: NEGATIVE mg/dL
Ketones, ur: NEGATIVE mg/dL
NITRITE: NEGATIVE
PH: 5 (ref 5.0–8.0)
Protein, ur: NEGATIVE mg/dL
SPECIFIC GRAVITY, URINE: 1.01 (ref 1.005–1.030)

## 2016-08-16 LAB — CBG MONITORING, ED: Glucose-Capillary: 131 mg/dL — ABNORMAL HIGH (ref 65–99)

## 2016-08-16 MED ORDER — ADULT MULTIVITAMIN W/MINERALS CH
1.0000 | ORAL_TABLET | Freq: Every day | ORAL | Status: DC
  Administered 2016-08-17 – 2016-08-18 (×2): 1 via ORAL
  Filled 2016-08-16 (×2): qty 1

## 2016-08-16 MED ORDER — INSULIN ASPART 100 UNIT/ML ~~LOC~~ SOLN
0.0000 [IU] | Freq: Three times a day (TID) | SUBCUTANEOUS | Status: DC
  Administered 2016-08-17: 1 [IU] via SUBCUTANEOUS

## 2016-08-16 MED ORDER — SODIUM CHLORIDE 0.9% FLUSH
3.0000 mL | Freq: Two times a day (BID) | INTRAVENOUS | Status: DC
  Administered 2016-08-17 – 2016-08-18 (×3): 3 mL via INTRAVENOUS

## 2016-08-16 MED ORDER — ASPIRIN 325 MG PO TABS
325.0000 mg | ORAL_TABLET | Freq: Every day | ORAL | Status: DC
  Administered 2016-08-17 – 2016-08-18 (×2): 325 mg via ORAL
  Filled 2016-08-16 (×2): qty 1

## 2016-08-16 MED ORDER — POLYETHYLENE GLYCOL 3350 17 G PO PACK
17.0000 g | PACK | Freq: Every day | ORAL | Status: DC
  Filled 2016-08-16: qty 1

## 2016-08-16 MED ORDER — DEXTROSE 5 % IV SOLN
1.0000 g | INTRAVENOUS | Status: DC
  Filled 2016-08-16 (×2): qty 10

## 2016-08-16 MED ORDER — SODIUM CHLORIDE 0.9 % IV SOLN: 250.0000 mL | INTRAVENOUS | Status: DC | PRN

## 2016-08-16 MED ORDER — ATORVASTATIN CALCIUM 40 MG PO TABS
40.0000 mg | ORAL_TABLET | Freq: Every day | ORAL | Status: DC
  Administered 2016-08-17: 40 mg via ORAL
  Filled 2016-08-16: qty 1

## 2016-08-16 MED ORDER — SODIUM CHLORIDE 0.9% FLUSH: 3.0000 mL | INTRAVENOUS | Status: DC | PRN

## 2016-08-16 MED ORDER — CLOPIDOGREL BISULFATE 75 MG PO TABS
75.0000 mg | ORAL_TABLET | Freq: Every day | ORAL | Status: DC
  Administered 2016-08-17 – 2016-08-18 (×2): 75 mg via ORAL
  Filled 2016-08-16 (×2): qty 1

## 2016-08-16 MED ORDER — NILOTINIB HCL 150 MG PO CAPS
300.0000 mg | ORAL_CAPSULE | Freq: Two times a day (BID) | ORAL | Status: DC
  Administered 2016-08-17 – 2016-08-18 (×2): 300 mg via ORAL

## 2016-08-16 MED ORDER — DEXTROSE 5 % IV SOLN
1.0000 g | Freq: Once | INTRAVENOUS | Status: AC
  Administered 2016-08-16: 1 g via INTRAVENOUS
  Filled 2016-08-16: qty 10

## 2016-08-16 MED ORDER — LOSARTAN POTASSIUM 50 MG PO TABS
100.0000 mg | ORAL_TABLET | Freq: Every day | ORAL | Status: DC
  Administered 2016-08-17: 100 mg via ORAL
  Filled 2016-08-16: qty 2

## 2016-08-16 NOTE — ED Notes (Signed)
Disoriented to year and month, but oriented other than that. Pt has bilateral weakness, more profound on the right side.

## 2016-08-16 NOTE — ED Triage Notes (Signed)
Pt came in by [facility] for weakness and slurred speech. Family spoke to pt yesterday afternoon, not sure what time, and pt was normal. Talked to pt just recently and noticed she is not A&O X4 and has some slurred speech.

## 2016-08-16 NOTE — H&P (Signed)
History and Physical    April Page JJH:[id number] DOB: 05-12-1945 DOA: 08/16/2016  PCP: Redmond School, MD  Patient coming from: home  Chief Complaint:  Slurred speech  HPI: April Page is a 71 y.o. female with medical history significant of CVA in march 2018 with residual slurred speech, HTN, HLD comes in with notable worsening slurred speech by her daughters today.  No new medications.  No numbness, tingling or weakness anywhere per patient.  No vision changes.  No fever.  No urinary symptoms.  No cough.  Pt was referred for admission for possible new stroke.  Pt is refusing MRI.   Review of Systems: As per HPI otherwise 10 point review of systems negative.   Past Medical History:  Diagnosis Date  . Arthritis    stiff knees  . Blood dyscrasia   . Cancer ([place])   . CVA (cerebral vascular accident) ([place])   . GERD (gastroesophageal reflux disease)   . H/O: CML (chronic myeloid leukemia)   . Hyperlipidemia   . Hypertension   . Obesity   . Pessary maintenance 07/01/2013    Past Surgical History:  Procedure Laterality Date  . ABDOMINAL HYSTERECTOMY    . CHOLECYSTECTOMY    . COLONOSCOPY  06/09/2002   YJE:[id number] hemorrhoids; otherwise normal rectum, colon   . COLONOSCOPY N/A 08/25/2013   YOV:ZCHYIFO diverticulosis. Single colonic polyp-removed  s/p segmental biopsy and stool sample. random colon bx negative. +benign leiomyoma.  . cystocele/rectocele repair  2009  . ESOPHAGOGASTRODUODENOSCOPY  06/09/2002   YDX:AJOINO upper gastrointestinal tract s/p  54-French Maloney dilator  . FEMUR IM NAIL Right 04/12/2016   Procedure: INTRAMEDULLARY (IM) NAIL FEMORAL;  Surgeon: Nicholes Stairs, MD;  Location: [facility];  Service: Orthopedics;  Laterality: Right;  . HERNIA REPAIR    . OOPHORECTOMY       reports that she has never smoked. She has never used smokeless tobacco. She reports that she does not drink alcohol or use drugs.  No Known Allergies  Family History  Problem  Relation Age of Onset  . Anuerysm Mother        deceased age 45, brain anuerysm  . Early death Mother 41  . Heart disease Father   . Hypertension Sister   . Obesity Brother   . Hypertension Sister   . Arthritis Sister   . Other Son        cardiac arrest  . Heart disease Child 40       cardiac arrest  . Colon cancer Neg Hx     Prior to Admission medications   Medication Sig Start Date End Date Taking? Authorizing Provider  aspirin 325 MG tablet Take 1 tablet (325 mg total) by mouth daily. 06/05/16  Yes Kathie Dike, MD  clopidogrel (PLAVIX) 75 MG tablet Take 1 tablet (75 mg total) by mouth daily. 06/04/16  Yes Kathie Dike, MD  losartan (COZAAR) 100 MG tablet Take 1 tablet (100 mg total) by mouth daily. Resume on 3/28 06/05/16  Yes Tat, Shanon Brow, MD  Multiple Vitamin (MULTIVITAMIN WITH MINERALS) TABS tablet Take 1 tablet by mouth daily.   Yes [provider]  nilotinib (TASIGNA) 150 MG capsule Take 2 capsules (300 mg total) by mouth every 12 (twelve) hours. 06/19/16  Yes [name], Manon Hilding, PA-C  polyethylene glycol (MIRALAX / GLYCOLAX) packet Take 17 g by mouth daily. 06/06/16  Yes Tat, Shanon Brow, MD  atorvastatin (LIPITOR) 40 MG tablet Take 1 tablet (40 mg total) by mouth daily at 6 PM.  Patient not taking: Reported on 08/16/2016 06/05/16   Kathie Dike, MD    Physical Exam: Vitals:   08/16/16 2142 08/16/16 2145 08/16/16 2200 08/16/16 2226  BP:  (!) 183/87 (!) 194/85   Pulse:      Resp:  (!) 22 19 18   Temp:    98.4 F (36.9 C)  TempSrc:    Oral  SpO2: 100%   100%  Weight:    66.1 kg (145 lb 11.6 oz)  Height:          Constitutional: NAD, calm, comfortable, speech is mildly slurred Vitals:   08/16/16 2142 08/16/16 2145 08/16/16 2200 08/16/16 2226  BP:  (!) 183/87 (!) 194/85   Pulse:      Resp:  (!) 22 19 18   Temp:    98.4 F (36.9 C)  TempSrc:    Oral  SpO2: 100%   100%  Weight:    66.1 kg (145 lb 11.6 oz)  Height:       Eyes: PERRL, lids and conjunctivae  normal ENMT: Mucous membranes are moist. Posterior pharynx clear of any exudate or lesions.Normal dentition.  Neck: normal, supple, no masses, no thyromegaly Respiratory: clear to auscultation bilaterally, no wheezing, no crackles. Normal respiratory effort. No accessory muscle use.  Cardiovascular: Regular rate and rhythm, no murmurs / rubs / gallops. No extremity edema. 2+ pedal pulses. No carotid bruits.  Abdomen: no tenderness, no masses palpated. No hepatosplenomegaly. Bowel sounds positive.  Musculoskeletal: no clubbing / cyanosis. No joint deformity upper and lower extremities. Good ROM, no contractures. Normal muscle tone.  Skin: no rashes, lesions, ulcers. No induration Neurologic: CN 2-12 grossly intact. Sensation intact, DTR normal. Strength 5/5 in all 4.  Psychiatric: Normal judgment and insight. Alert and oriented x 3. Normal mood.    Labs on Admission: I have personally reviewed following labs and imaging studies  CBC:  Recent Labs Lab 08/16/16 1713  WBC 6.7  NEUTROABS 3.7  HGB 11.4*  HCT 35.7*  MCV 83.8  PLT 951   Basic Metabolic Panel:  Recent Labs Lab 08/16/16 1713  NA 140  K 3.6  CL 107  CO2 25  GLUCOSE 119*  BUN 13  CREATININE 1.02*  CALCIUM 9.6   GFR: Estimated Creatinine Clearance: 49.2 mL/min (A) (by C-G formula based on SCr of 1.02 mg/dL (H)).  CBG:  Recent Labs Lab 08/16/16 1710  GLUCAP 131*   Urine analysis:    Component Value Date/Time   COLORURINE YELLOW 08/16/2016 1850   APPEARANCEUR CLOUDY (A) 08/16/2016 1850   LABSPEC 1.010 08/16/2016 1850   PHURINE 5.0 08/16/2016 1850   GLUCOSEU NEGATIVE 08/16/2016 1850   HGBUR SMALL (A) 08/16/2016 1850   BILIRUBINUR NEGATIVE 08/16/2016 1850   KETONESUR NEGATIVE 08/16/2016 1850   PROTEINUR NEGATIVE 08/16/2016 1850   NITRITE NEGATIVE 08/16/2016 1850   LEUKOCYTESUR LARGE (A) 08/16/2016 1850    Radiological Exams on Admission: Ct Head Wo Contrast  Result Date: 08/16/2016 CLINICAL DATA:   71 year old female with slurred speech and weakness today. EXAM: CT HEAD WITHOUT CONTRAST TECHNIQUE: Contiguous axial images were obtained from the base of the skull through the vertex without intravenous contrast. COMPARISON:  06/03/2016 CT and MR and prior studies FINDINGS: Brain: No evidence of acute infarction, hemorrhage, hydrocephalus, extra-axial collection or mass lesion/mass effect. Mild chronic small-vessel white matter ischemic changes and remote right basal ganglia/ internal capsule infarct again noted. Vascular: Intracranial atherosclerotic calcifications noted. Skull: Normal. Negative for fracture or focal lesion. Sinuses/Orbits: No acute finding. Other: None  IMPRESSION: No evidence of acute intracranial abnormality. Mild chronic small-vessel white matter ischemic changes and remote right basal ganglia/ internal capsule infarct. Electronically Signed   By: Margarette Canada M.D.   On: 08/16/2016 18:50    EKG: Independently reviewed. nsr no acute issues Old chart reviewed Case discussed with dr cook  Assessment/Plan 71 yo female with h/o recent CVA comes in with worsening slurred speech concerning for new stroke  Principal Problem:   Slurred speech-  R/o stroke however this will be difficult as patient is refusing to do mri of her brain.  Cont current meds.  Check freq neuro checks.  Telemetry monitoring.  Her children are not happy about her refusing mri, I would reassess this in the am to see if she has changed her mind.  Dr Merlene Laughter called for consult and will see in am.  I will not repeat full stoke evaluation since this was done less than 2 months ago, main thing would be obtaining MRI which again she is declining.  Active Problems:   Diabetes type 2, uncontrolled ([place])- ssi   Chronic myelogenous leukemia (CML), BCR-ABL1-positive ([place])- noted   Essential hypertension- stable   Hyperlipidemia- place on statin   CVA (cerebral vascular accident) ([place]) 3/18- cont plavix and aspirin    Dysphagia- obtain swallow study/evaluation   UTI (urinary tract infection)- rocephin, cx pending     DVT prophylaxis:  scds Code Status:  full Family Communication: daughters  Disposition Plan:  Per day team Consults called:   neurology Admission status:  observation   DAVID,RACHAL A MD [facility]  If 7PM-7AM, please contact night-coverage [email or web address] Password [email or web address]  08/16/2016, 10:45 PM

## 2016-08-16 NOTE — ED Provider Notes (Signed)
[facility] DEPT Provider Note   CSN: [id number] Arrival date & time: 08/16/16  1701     History   Chief Complaint Chief Complaint  Patient presents with  . Weakness    HPI April Page is a 71 y.o. female.  Level V caveat for urgent need for intervention. Most of history obtained from 2 daughters. Patient was status post CVA in March 2018. She was "normal" at 7:30 this morning when a caregiver left the home. Her daughter spoke to on the phone at 4:15 PM and thought that her speech was slurred. Additionally, she complains of difficulty swallowing. No gross motor deficits. No facial asymmetry.        Past Medical History:  Diagnosis Date  . Arthritis    stiff knees  . Blood dyscrasia   . Cancer ([place])   . CVA (cerebral vascular accident) ([place])   . GERD (gastroesophageal reflux disease)   . H/O: CML (chronic myeloid leukemia)   . Hyperlipidemia   . Hypertension   . Obesity   . Pessary maintenance 07/01/2013    Patient Active Problem List   Diagnosis Date Noted  . Constipation 07/10/2016  . Dysphagia 07/10/2016  . Loss of weight 07/10/2016  . Impaired glucose tolerance 06/05/2016  . Slurred speech   . CVA (cerebral vascular accident) ([place]) 06/03/2016  . Lightheadedness 04/13/2016  . Hypokalemia 04/13/2016  . Hyperglycemia 04/13/2016  . Essential hypertension 04/13/2016  . Hyperlipidemia 04/13/2016  . S/P right hip fracture 04/12/2016  . Hip fracture ([place]) 04/12/2016  . Fall   . Itching in the vaginal area 10/05/2014  . Chronic myelogenous leukemia (CML), BCR-ABL1-positive ([place])   . GERD (gastroesophageal reflux disease) 12/06/2013  . Leukocytosis 08/06/2013  . Diabetes type 2, uncontrolled ([place]) 08/06/2013  . Atypical chest pain 08/06/2013  . Chronic diarrhea 08/05/2013  . Pessary maintenance 07/01/2013    Past Surgical History:  Procedure Laterality Date  . ABDOMINAL HYSTERECTOMY    . CHOLECYSTECTOMY    . COLONOSCOPY  06/09/2002   HTD:[id number]  hemorrhoids; otherwise normal rectum, colon   . COLONOSCOPY N/A 08/25/2013   XBW:IOMBTDH diverticulosis. Single colonic polyp-removed  s/p segmental biopsy and stool sample. random colon bx negative. +benign leiomyoma.  . cystocele/rectocele repair  2009  . ESOPHAGOGASTRODUODENOSCOPY  06/09/2002   RCB:ULAGTX upper gastrointestinal tract s/p  54-French Maloney dilator  . FEMUR IM NAIL Right 04/12/2016   Procedure: INTRAMEDULLARY (IM) NAIL FEMORAL;  Surgeon: Nicholes Stairs, MD;  Location: [facility];  Service: Orthopedics;  Laterality: Right;  . HERNIA REPAIR    . OOPHORECTOMY      OB History    Gravida Para Term Preterm AB Living   6 6       5    SAB TAB Ectopic Multiple Live Births                   Home Medications    Prior to Admission medications   Medication Sig Start Date End Date Taking? Authorizing Provider  aspirin 325 MG tablet Take 1 tablet (325 mg total) by mouth daily. 06/05/16  Yes Kathie Dike, MD  clopidogrel (PLAVIX) 75 MG tablet Take 1 tablet (75 mg total) by mouth daily. 06/04/16  Yes Kathie Dike, MD  losartan (COZAAR) 100 MG tablet Take 1 tablet (100 mg total) by mouth daily. Resume on 3/28 06/05/16  Yes Tat, Shanon Brow, MD  Multiple Vitamin (MULTIVITAMIN WITH MINERALS) TABS tablet Take 1 tablet by mouth daily.   Yes [provider]  nilotinib (  TASIGNA) 150 MG capsule Take 2 capsules (300 mg total) by mouth every 12 (twelve) hours. 06/19/16  Yes [name], Manon Hilding, PA-C  polyethylene glycol (MIRALAX / GLYCOLAX) packet Take 17 g by mouth daily. 06/06/16  Yes Tat, Shanon Brow, MD  atorvastatin (LIPITOR) 40 MG tablet Take 1 tablet (40 mg total) by mouth daily at 6 PM. Patient not taking: Reported on 08/16/2016 06/05/16   Kathie Dike, MD    Family History Family History  Problem Relation Age of Onset  . Anuerysm Mother        deceased age 39, brain anuerysm  . Early death Mother 57  . Heart disease Father   . Hypertension Sister   . Obesity Brother   .  Hypertension Sister   . Arthritis Sister   . Other Son        cardiac arrest  . Heart disease Child 58       cardiac arrest  . Colon cancer Neg Hx     Social History Social History  Substance Use Topics  . Smoking status: Never Smoker  . Smokeless tobacco: Never Used  . Alcohol use No     Allergies   Patient has no known allergies.   Review of Systems Review of Systems  Reason unable to perform ROS: Urgent need for intervention.     Physical Exam Updated Vital Signs BP (!) 184/91   Pulse (!) 57   Resp 11   Ht 5\' 7"  (1.702 m)   Wt 67.6 kg (149 lb)   SpO2 100%   BMI 23.34 kg/m   Physical Exam  Constitutional: She is oriented to person, place, and time. She appears well-developed and well-nourished.  Questionable slurring of speech  HENT:  Head: Normocephalic and atraumatic.  Eyes: Conjunctivae are normal.  Neck: Neck supple.  Cardiovascular: Normal rate and regular rhythm.   Pulmonary/Chest: Effort normal and breath sounds normal.  Abdominal: Soft. Bowel sounds are normal.  Musculoskeletal: Normal range of motion.  Neurological: She is alert and oriented to person, place, and time.  Appears to move arms and legs normally.  Skin: Skin is warm and dry.  Psychiatric: She has a normal mood and affect. Her behavior is normal.  Nursing note and vitals reviewed.    ED Treatments / Results  Labs (all labs ordered are listed, but only abnormal results are displayed) Labs Reviewed  CBC WITH DIFFERENTIAL/PLATELET - Abnormal; Notable for the following:       Result Value   Hemoglobin 11.4 (*)    HCT 35.7 (*)    RDW 17.0 (*)    All other components within normal limits  BASIC METABOLIC PANEL - Abnormal; Notable for the following:    Glucose, Bld 119 (*)    Creatinine, Ser 1.02 (*)    GFR calc non Af Amer 54 (*)    All other components within normal limits  URINALYSIS, ROUTINE W REFLEX MICROSCOPIC - Abnormal; Notable for the following:    APPearance CLOUDY  (*)    Hgb urine dipstick SMALL (*)    Leukocytes, UA LARGE (*)    Bacteria, UA RARE (*)    Squamous Epithelial / LPF 6-30 (*)    All other components within normal limits  CBG MONITORING, ED - Abnormal; Notable for the following:    Glucose-Capillary 131 (*)    All other components within normal limits  URINE CULTURE    EKG  EKG Interpretation  Date/Time:  Friday August 16 2016 17:11:50 EDT Ventricular Rate:  61 PR Interval:    QRS Duration: 93 QT Interval:  444 QTC Calculation: 448 R Axis:   -20 Text Interpretation:  Sinus rhythm Borderline left axis deviation Abnormal R-wave progression, early transition Baseline wander in lead(s) II Confirmed by Nat Christen [phone]) on 08/16/2016 6:01:02 PM       Radiology Ct Head Wo Contrast  Result Date: 08/16/2016 CLINICAL DATA:  71 year old female with slurred speech and weakness today. EXAM: CT HEAD WITHOUT CONTRAST TECHNIQUE: Contiguous axial images were obtained from the base of the skull through the vertex without intravenous contrast. COMPARISON:  06/03/2016 CT and MR and prior studies FINDINGS: Brain: No evidence of acute infarction, hemorrhage, hydrocephalus, extra-axial collection or mass lesion/mass effect. Mild chronic small-vessel white matter ischemic changes and remote right basal ganglia/ internal capsule infarct again noted. Vascular: Intracranial atherosclerotic calcifications noted. Skull: Normal. Negative for fracture or focal lesion. Sinuses/Orbits: No acute finding. Other: None IMPRESSION: No evidence of acute intracranial abnormality. Mild chronic small-vessel white matter ischemic changes and remote right basal ganglia/ internal capsule infarct. Electronically Signed   By: Margarette Canada M.D.   On: 08/16/2016 18:50    Procedures Procedures (including critical care time)  Medications Ordered in ED Medications  cefTRIAXone (ROCEPHIN) 1 g in dextrose 5 % 50 mL IVPB (1 g Intravenous New Bag/Given 08/16/16 2126)     Initial  Impression / Assessment and Plan / ED Course  I have reviewed the triage vital signs and the nursing notes.  Pertinent labs & imaging results that were available during my care of the patient were reviewed by me and considered in my medical decision making (see chart for details).     Family reports slurred speech and difficulty swallowing. CT head negative. Discussed with local neurologist Dr. Merlene Laughter.  No acute interventions recommended. Rx Rocephin 1 g IV for urinary tract infection. Will admit to general medicine.  Final Clinical Impressions(s) / ED Diagnoses   Final diagnoses:  Slurred speech  Urinary tract infection without hematuria, site unspecified    New Prescriptions New Prescriptions   No medications on file     Nat Christen, MD 08/16/16 2149

## 2016-08-16 NOTE — ED Notes (Signed)
MD Lacinda Axon notified of pt.

## 2016-08-17 ENCOUNTER — Observation Stay (HOSPITAL_COMMUNITY): Payer: Medicare HMO

## 2016-08-17 DIAGNOSIS — Z7902 Long term (current) use of antithrombotics/antiplatelets: Secondary | ICD-10-CM | POA: Diagnosis not present

## 2016-08-17 DIAGNOSIS — I639 Cerebral infarction, unspecified: Secondary | ICD-10-CM | POA: Diagnosis present

## 2016-08-17 DIAGNOSIS — L8990 Pressure ulcer of unspecified site, unspecified stage: Secondary | ICD-10-CM | POA: Diagnosis present

## 2016-08-17 DIAGNOSIS — K5730 Diverticulosis of large intestine without perforation or abscess without bleeding: Secondary | ICD-10-CM | POA: Diagnosis present

## 2016-08-17 DIAGNOSIS — E785 Hyperlipidemia, unspecified: Secondary | ICD-10-CM | POA: Diagnosis present

## 2016-08-17 DIAGNOSIS — Z79899 Other long term (current) drug therapy: Secondary | ICD-10-CM | POA: Diagnosis not present

## 2016-08-17 DIAGNOSIS — I69322 Dysarthria following cerebral infarction: Secondary | ICD-10-CM | POA: Diagnosis not present

## 2016-08-17 DIAGNOSIS — I10 Essential (primary) hypertension: Secondary | ICD-10-CM | POA: Diagnosis present

## 2016-08-17 DIAGNOSIS — R4789 Other speech disturbances: Secondary | ICD-10-CM

## 2016-08-17 DIAGNOSIS — Z90710 Acquired absence of both cervix and uterus: Secondary | ICD-10-CM | POA: Diagnosis not present

## 2016-08-17 DIAGNOSIS — R1310 Dysphagia, unspecified: Secondary | ICD-10-CM | POA: Diagnosis present

## 2016-08-17 DIAGNOSIS — N390 Urinary tract infection, site not specified: Secondary | ICD-10-CM | POA: Diagnosis present

## 2016-08-17 DIAGNOSIS — E119 Type 2 diabetes mellitus without complications: Secondary | ICD-10-CM | POA: Diagnosis present

## 2016-08-17 DIAGNOSIS — R4781 Slurred speech: Secondary | ICD-10-CM | POA: Diagnosis not present

## 2016-08-17 DIAGNOSIS — E1165 Type 2 diabetes mellitus with hyperglycemia: Secondary | ICD-10-CM | POA: Diagnosis present

## 2016-08-17 DIAGNOSIS — Z7982 Long term (current) use of aspirin: Secondary | ICD-10-CM | POA: Diagnosis not present

## 2016-08-17 DIAGNOSIS — K219 Gastro-esophageal reflux disease without esophagitis: Secondary | ICD-10-CM | POA: Diagnosis present

## 2016-08-17 DIAGNOSIS — C9210 Chronic myeloid leukemia, BCR/ABL-positive, not having achieved remission: Secondary | ICD-10-CM | POA: Diagnosis present

## 2016-08-17 LAB — GLUCOSE, CAPILLARY
GLUCOSE-CAPILLARY: 113 mg/dL — AB (ref 65–99)
Glucose-Capillary: 117 mg/dL — ABNORMAL HIGH (ref 65–99)
Glucose-Capillary: 148 mg/dL — ABNORMAL HIGH (ref 65–99)
Glucose-Capillary: 112 mg/dL — ABNORMAL HIGH (ref 65–99)

## 2016-08-17 MED ORDER — CEPHALEXIN 500 MG PO CAPS
500.0000 mg | ORAL_CAPSULE | Freq: Two times a day (BID) | ORAL | Status: DC
  Administered 2016-08-17 – 2016-08-18 (×2): 500 mg via ORAL
  Filled 2016-08-17 (×2): qty 1

## 2016-08-17 MED ORDER — POLYETHYLENE GLYCOL 3350 17 G PO PACK
17.0000 g | PACK | Freq: Every day | ORAL | Status: DC
  Filled 2016-08-17: qty 1

## 2016-08-17 MED ORDER — STROKE: EARLY STAGES OF RECOVERY BOOK
Freq: Once | Status: AC
  Administered 2016-08-18: 10:00:00
  Filled 2016-08-17: qty 1

## 2016-08-17 NOTE — Progress Notes (Signed)
PROGRESS NOTE  April Page NLZ:[id number] DOB: [birth year] DOA: 08/16/2016 PCP: April School, MD  Brief Narrative: 71 year-old woman PMH CVA with residual slurred speech, hypertension, hyperlipidemia presented with worsening slurred speech per family. No other focal deficits noted. Admitted for slurred speech, rule out stroke.  Assessment/Plan #1: Acute worsening of chronic slurred speech secondary to previous stroke. On ASA and Plavix as outpatient, statin. CT head negative. Refused MRI yesterday. -Slurred speech improved but not back to baseline today. No other acute issues. Patient now agreeable to MRI. -Discussed with Dr. Merlene Page via telephone (not available for consult formally), recommends continuing ASA and Plavix without change -SLP consult for speech and swallowing. PT consult. -Remainder of stroke work up completed back in March as below  #2: PMH ischemic CVA 05/2016. Seen by neurology, recommendation for aspirin 325 daily, Plavix 75 daily for 90 days, then aspirin alone. Carotid duplex was negative for hemodynamically significant stenosis. LDL 222, hemoglobin A1c 6.4. Echocardiogram LVEF 60-65%. Grade 1 diastolic dysfunction.  #3: Diabetes mellitus type 2? -Blood sugars stable. Not on any medications. Hemoglobin A1c 6.4 in March. -Sliding-scale insulin  #4:Hypertension -Stable. Hold antihypertensives for now. Permissive hypertension.   #5:Hyperlipidemia -Stable. Continue statin.   #6: UTI -Follow culture. Change to oral antibiotics.  PMH CML   Follow-up MRI brain. Suspicious for recurrent stroke. Disposition pending MRI.  DVT prophylaxis: SCDs Code Status: full Family Communication: 2 daughters at bedside Disposition Plan: home    April Hodgkins, MD  [facility] Direct contact: [phone] --[address] [place]  --[email or web address]; password [email or web address]  7PM-7AM contact night coverage as above 08/17/2016, 11:31 AM  LOS: 0 days    Consultants:    Procedures:    Antimicrobials:    Interval history/Subjective: Slurred speech not back to baseline. No other focal neurologic deficits. Reports coughing with food.  Objective: Vitals: afebrile, temp 98.5, 161/87, 71  Exam:     Constitutional: Appears calm, comfortable lying in bed  Cardiovascular: Regular rate and rhythm. No murmur, rub or gallop. No lower extremity edema.  Respiratory: Clear to auscultation bilaterally. No wheezes, rales or rhonchi. Normal respiratory effort.  Neurologic: Cranial nerves II-12 appear intact. Speech is somewhat slow, slightly slurred. Sensation grossly intact in all extremities.  Eyes: Lids and irises appear unremarkable. Dense cataract left eye. Left pupil slightly irregular, reactive to light. Right pupil round and reactive to light.  ENT: Grossly normal lips and tongue. Hearing grossly normal.   I have personally reviewed the following:   Labs:  Basic metabolic panel on admission unremarkable  CBC unremarkable  Urinalysis grossly abnormal  Imaging studies:  CT head no acute abnormalities  Medical tests:  EKG sinus rhythm   Test discussed with performing physician:    Decision to obtain old records:    Review and summation of old records:  Under body of assessment and plan describing old stroke  Scheduled Meds: . aspirin  325 mg Oral Daily  . atorvastatin  40 mg Oral q1800  . cephALEXin  500 mg Oral Q12H  . clopidogrel  75 mg Oral Daily  . insulin aspart  0-9 Units Subcutaneous TID WC  . multivitamin with minerals  1 tablet Oral Daily  . nilotinib  300 mg Oral Q12H  . polyethylene glycol  17 g Oral q1800  . sodium chloride flush  3 mL Intravenous Q12H   Continuous Infusions: . sodium chloride      Principal Problem:   Slurred speech Active Problems:   Essential hypertension  Hyperlipidemia   CVA (cerebral vascular accident) ([place]) 3/18   Dysphagia   UTI (urinary tract  infection)   Pressure injury of skin   LOS: 0 days

## 2016-08-17 NOTE — Evaluation (Signed)
Physical Therapy Evaluation Patient Details Name: April Page MRN: [id number] DOB: 1945/09/11 Today's Date: 08/17/2016   History of Present Illness  April Page is a 71 y.o. female with medical history significant of CVA in march 2018 with residual slurred speech, HTN, HLD comes in with notable worsening slurred speech by her daughters today.  No new medications.  No numbness, tingling or weakness anywhere per patient.  No vision changes.  No fever.  No urinary symptoms.  No cough.  Pt was referred for admission for possible new stroke.  Pt is refusing MRI.  Clinical Impression  Pt received lying in bed with dtr at bedside and was agreeable to PT evaluation. Pt from home where she ambulates household and limited community distance with RW and needs assistance with dressing and bathing. She has a dtr and nursing aide that stays overnight with her, but she is typically alone during the day. She was able to ambulate 362ft with RW and supervision to min guard this date. Pt currently participating in [place] for balance and strengthening s/p her last hospital discharge (06/05/16). Pt's dtr verbalized that the she is not compliant with her OPPT HEP because there is nobody there during the day to make the pt do her exercises. Her dtr also verbalized that she wanted the pt to go to a rehab or have someone there with her all day in order to help the pt do her exercises. PT educated the pt and her dtr that due to her mobility, she does not qualify for SNF or rehab placement since she was able to perform transfers and ambulation with supervision to min guard. Both verbalized understanding. At this time, PT will follow acutely to continue to promote strength, balance, and endurance and recommend continuation of OPPT services once cleared for d/c.    Follow Up Recommendations Outpatient PT    Equipment Recommendations  None recommended by PT    Recommendations for Other Services       Precautions /  Restrictions Precautions Precautions: Fall Precaution Comments: due to decreased ambulation speed and h/o falls in the past, though none since she fell and broke her hip in February 2018      Mobility  Bed Mobility Overal bed mobility: Modified Independent             General bed mobility comments: increased time to complete task  Transfers Overall transfer level: Needs assistance Equipment used: Rolling walker (2 wheeled) Transfers: Sit to/from Stand Sit to Stand: Supervision;Min guard         General transfer comment: increased time to complete task, min cueing for hand placement  Ambulation/Gait Ambulation/Gait assistance: Supervision;Min guard Ambulation Distance (Feet): 300 Feet Assistive device: Rolling walker (2 wheeled) Gait Pattern/deviations: Step-through pattern;Decreased step length - right;Decreased stride length;Decreased dorsiflexion - right;Decreased dorsiflexion - left;Narrow base of support   Gait velocity interpretation: <1.8 ft/sec, indicative of risk for recurrent falls General Gait Details: no signs of unsteadiness but occassionally let her feet get outside BOS of RW; decreased DF R>L, putting pt at increased risk for tripping and falling; cued pt to correct which she did for a few steps but then returned to dragging her R toe. circumduction of RLE>LLE; otherwise, pt steady throughout  Stairs            Wheelchair Mobility    Modified Rankin (Stroke Patients Only)       Balance Overall balance assessment: Needs assistance Sitting-balance support: No upper extremity supported Sitting balance-Leahy Scale: Normal  Standing balance support: Bilateral upper extremity supported Standing balance-Leahy Scale: Fair                               Pertinent Vitals/Pain Pain Assessment: No/denies pain    Home Living Family/patient expects to be discharged to:: Private residence Living Arrangements: Alone (alone during the day,  but dtr stays with her during the nights. also has nursing aide that stays overnight Mon through [employer]) Available Help at Discharge: Available 24 hours/day Type of Home: Apartment Home Access: Stairs to enter     Home Layout: One level Home Equipment: Environmental consultant - 2 wheels;Cane - single point;Shower seat;Bedside commode;Wheelchair - manual      Prior Function Level of Independence: Needs assistance   Gait / Transfers Assistance Needed: pt ambulated with RW in the house, outside to the car, and limited community distances.  ADL's / Homemaking Assistance Needed: pt requires assistance for dressing and bathing.        Hand Dominance   Dominant Hand: Right    Extremity/Trunk Assessment   Upper Extremity Assessment Upper Extremity Assessment: Overall WFL for tasks assessed    Lower Extremity Assessment Lower Extremity Assessment: Generalized weakness    Cervical / Trunk Assessment Cervical / Trunk Assessment: Kyphotic  Communication   Communication: No difficulties  Cognition Arousal/Alertness: Awake/alert Behavior During Therapy: WFL for tasks assessed/performed Overall Cognitive Status: Within Functional Limits for tasks assessed                                 General Comments: unsure of date but knew the month and year      General Comments      Exercises     Assessment/Plan    PT Assessment Patient needs continued PT services  PT Problem List Decreased strength;Decreased balance;Decreased activity tolerance;Decreased mobility       PT Treatment Interventions DME instruction;Gait training;Functional mobility training;Therapeutic activities;Therapeutic exercise;Balance training;Neuromuscular re-education;Patient/family education    PT Goals (Current goals can be found in the Care Plan section)  Acute Rehab PT Goals Patient Stated Goal: to go home PT Goal Formulation: With patient/family Time For Goal Achievement: 08/20/16 Potential to Achieve  Goals: Good    Frequency Min 3X/week   Barriers to discharge        Co-evaluation               AM-PAC PT "6 Clicks" Daily Activity  Outcome Measure Difficulty turning over in bed (including adjusting bedclothes, sheets and blankets)?: None Difficulty moving from lying on back to sitting on the side of the bed? : None Difficulty sitting down on and standing up from a chair with arms (e.g., wheelchair, bedside commode, etc,.)?: None Help needed moving to and from a bed to chair (including a wheelchair)?: A Little Help needed walking in hospital room?: A Little Help needed climbing 3-5 steps with a railing? : A Lot 6 Click Score: 20    End of Session Equipment Utilized During Treatment: Gait belt (RW) Activity Tolerance: Patient tolerated treatment well;No increased pain Patient left: in bed;with call bell/phone within reach;with bed alarm set;with family/visitor present Nurse Communication: Mobility status PT Visit Diagnosis: Muscle weakness (generalized) (M62.81);History of falling (Z91.81);Other abnormalities of gait and mobility (R26.89)    Time: 7673-4193 PT Time Calculation (min) (ACUTE ONLY): 41 min   Charges:   PT Evaluation $PT Eval Low Complexity: 1 Procedure  PT Treatments $Gait Training: 8-22 mins $Therapeutic Activity: 8-22 mins   PT G Codes:   PT G-Codes **NOT FOR INPATIENT CLASS** Functional Assessment Tool Used: AM-PAC 6 Clicks Basic Mobility;Clinical judgement Functional Limitation: Mobility: Walking and moving around Mobility: Walking and Moving Around Current Status (R4935): At least 40 percent but less than 60 percent impaired, limited or restricted Mobility: Walking and Moving Around Goal Status [phone]): At least 20 percent but less than 40 percent impaired, limited or restricted     Geraldine Solar PT, DPT

## 2016-08-18 DIAGNOSIS — I10 Essential (primary) hypertension: Secondary | ICD-10-CM

## 2016-08-18 DIAGNOSIS — E785 Hyperlipidemia, unspecified: Secondary | ICD-10-CM

## 2016-08-18 DIAGNOSIS — I639 Cerebral infarction, unspecified: Principal | ICD-10-CM

## 2016-08-18 LAB — URINE CULTURE

## 2016-08-18 LAB — GLUCOSE, CAPILLARY
GLUCOSE-CAPILLARY: 129 mg/dL — AB (ref 65–99)
Glucose-Capillary: 110 mg/dL — ABNORMAL HIGH (ref 65–99)

## 2016-08-18 MED ORDER — ATORVASTATIN CALCIUM 40 MG PO TABS: 40.0000 mg | ORAL_TABLET | Freq: Every day | ORAL | 0 refills | Status: AC

## 2016-08-18 MED ORDER — LOSARTAN POTASSIUM 50 MG PO TABS
100.0000 mg | ORAL_TABLET | Freq: Every day | ORAL | Status: DC
  Administered 2016-08-18: 100 mg via ORAL
  Filled 2016-08-18: qty 2

## 2016-08-18 MED ORDER — CLOPIDOGREL BISULFATE 75 MG PO TABS: 75.0000 mg | ORAL_TABLET | Freq: Every day | ORAL | 1 refills | Status: DC

## 2016-08-18 MED ORDER — LOSARTAN POTASSIUM 100 MG PO TABS: 100.0000 mg | ORAL_TABLET | Freq: Every day | ORAL | 0 refills | Status: AC

## 2016-08-18 MED ORDER — ONDANSETRON HCL 4 MG/2ML IJ SOLN
4.0000 mg | Freq: Four times a day (QID) | INTRAMUSCULAR | Status: DC | PRN
  Administered 2016-08-18: 4 mg via INTRAVENOUS
  Filled 2016-08-18: qty 2

## 2016-08-18 NOTE — Discharge Summary (Signed)
Physician Discharge Summary  April Page ZOX:[id number] DOB: 12-15-1945 DOA: 08/16/2016  PCP: Redmond School, MD  Admit date: 08/16/2016 Discharge date: 08/18/2016  Recommendations for Outpatient Follow-up:  1. Follow-up recurrent stroke. Follow-up with neurology one month. In the interim, obtain outpatient event monitor to rule out occult arrhythmia. 2. Outpatient physical therapy and speech therapy for both cognitive and swallowing therapy.     Follow-up Information    Phillips Odor, MD. Schedule an appointment as soon as possible for a visit in 1 month(s).   Specialty:  Neurology Contact information: [address] [place] [address] [phone]              Follow-up Information    Phillips Odor, MD. Schedule an appointment as soon as possible for a visit in 1 month(s).   Specialty:  Neurology Contact information: [address] [place] [address] [phone]             Discharge Diagnoses:  1. Acute CVA, multiple small infarcts left hemisphere of the brain, predominantly in left ACA territory; associated symptoms expressive aphasia and dysphagia 2. Hypertension 3. Hyperlipidemia  Discharge Condition: improved Disposition: home with outpatient PT  Diet recommendation:  Dysphagia 3 (Mech soft);Thin liquid  Liquid Administration via: Cup;Straw Medication Administration: Whole meds with liquid Supervision: Patient able to self feed;Intermittent supervision to cue for compensatory strategies Compensations: Minimize environmental distractions;Slow rate (Dtr reports rapid intake at home-not observed today) Postural Changes: Seated upright at 90 degrees;Remain upright for at least 30 minutes after po intake      Filed Weights   08/16/16 1750 08/16/16 2226  Weight: 67.6 kg (149 lb) 66.1 kg (145 lb 11.6 oz)    History of present illness:  71 year-old woman PMH CVA with residual slurred speech, hypertension,  hyperlipidemia presented with worsening slurred speech per family. No other focal deficits noted. Admitted for slurred speech, rule out stroke.  Hospital Course:  Patient was observed overnight, she subsequently agreed to obtain an MRI the following day which revealed scattered acute small infarcts in the left hemisphere primarily in the left ACA territory. Symptoms were expressive aphasia, dysphagia. Remainder of workup was completed back in March with her initial stroke and was not repeated. Case was discussed with neurologist on call Dr. Shon Hale as neurology not available until 6/11. He recommended continuing aspirin, Plavix. He recommended outpatient cardiac event monitor and follow-up with the patient's primary neurologist. Patient was seen by speech therapy with recommendations for mechanical soft diet and thin liquids. She was also seen by physical therapy and outpatient physical therapy was recommended.  #1 CVA/Acute small infarcts in the left hemisphere the brain. LDL 222 06/04/2016. Hemoglobin A1c 6.4 06/03/2016.  #2 Hypertension. Permissive hypertension. Plan on resuming antihypertensives on discharge.  #3 Hyperlipidemia. Continue statin  #4 Abnormal urinalysis. Culture multiple species, non-predominant.   Procedures:  Echo 06/04/2016 Study Conclusions  - Left ventricle: The cavity size was normal. Wall thickness was increased in a pattern of moderate LVH. Systolic function was normal. The estimated ejection fraction was in the range of 60% to 65%. Doppler parameters are consistent with abnormal left ventricular relaxation (grade 1 diastolic dysfunction). - Aortic valve: Mildly calcified annulus. Normal thickness leaflets. Valve area (VTI): 2.61 cm^2. Valve area (Vmax): 2.18 cm^2. Valve area (Vmean): 2.46 cm^2. - Atrial septum: No defect or patent foramen ovale was identified. - Technically difficult study.   Bilateral carotid US 3/27 IMPRESSION: 1.  Minimal bilateral carotid atherosclerotic vascular disease. No flow limiting stenosis.  Degree of stenosis less than 50% Bilaterally.   MRA Head 3/26 IMPRESSION: 1. High-grade stenosis or occlusion of the left posterior cerebral artery proximally may account for the left corpus callosum infarct. 2. Moderate narrowing of the proximal right posterior cerebral artery. 3. Moderate to high-grade stenosis of the distal right M1 segment, just before the bifurcation. 4. Moderate stenoses of proximal left P2 segments at the left MCA bifurcation. 5. Moderate attenuation of MCA branch vessels, right worse than left. 6. Mild narrowing of the cavernous right internal carotid artery.  2. Vertebrals are patent with antegrade flow .  At time of study, neurologist consulted and recommended no intervention for stenoses documented above.  Today's assessment: S: Vomited earlier, but feels ok now. No new neurologic symptoms. Did well with physical therapy yesterday. Speech therapy recommendations. O: Vitals: Afebrile, temperature 98.2, respirations 15, pulse 65, blood pressure 190/83   Constitutional: Appears calm, comfortable.  Psychiatric: Flat affect, appears depressed. Speech is slow but clear without obvious word finding difficulty.  Neurologic: Cranial nerves II-XII appear intact, note the patient has slight asymmetry in pupil size which is stable from yesterday. No pronator drift. No upper extremity dysdiadochokinesis. Grossly normal sensation in all extremities.  Musculoskeletal: Tone and strength all joints appears grossly normal.  Cardiovascular: Regular rate and rhythm. No murmur, rub or gallop. No lower extremity edema.  Respiratory: Clear to auscultation bilaterally. No wheezes, rales or rhonchi. Normal respiratory effort.  Discharge Instructions      Discharge Instructions    Ambulatory referral to Speech Therapy    Complete by:  As directed    Language and swallowing  therapy   Diet - low sodium heart healthy    Complete by:  As directed    Discharge instructions    Complete by:  As directed    Call your physician or seek immediate medical attention for difficulty speaking, swallowing, breathing; weakness, shortness of breath, numbness or worsening of condition.   Increase activity slowly    Complete by:  As directed      Allergies as of 08/18/2016   No Known Allergies        Medication List    TAKE these medications   aspirin 325 MG tablet Take 1 tablet (325 mg total) by mouth daily.   atorvastatin 40 MG tablet Commonly known as:  LIPITOR Take 1 tablet (40 mg total) by mouth daily at 6 PM.   clopidogrel 75 MG tablet Commonly known as:  PLAVIX Take 1 tablet (75 mg total) by mouth daily.   losartan 100 MG tablet Commonly known as:  COZAAR Take 1 tablet (100 mg total) by mouth daily. Resume on 3/28   multivitamin with minerals Tabs tablet Take 1 tablet by mouth daily.   nilotinib 150 MG capsule Commonly known as:  TASIGNA Take 2 capsules (300 mg total) by mouth every 12 (twelve) hours.   polyethylene glycol packet Commonly known as:  MIRALAX / GLYCOLAX Take 17 g by mouth daily.      No Known Allergies   The results of significant diagnostics from this hospitalization (including imaging, microbiology, ancillary and laboratory) are listed below for reference.    Significant Diagnostic Studies:  Imaging Results  Ct Head Wo Contrast  Result Date: 08/16/2016 CLINICAL DATA:  71 year old female with slurred speech and weakness today. EXAM: CT HEAD WITHOUT CONTRAST TECHNIQUE: Contiguous axial images were obtained from the base of the skull through the vertex without intravenous contrast. COMPARISON:  06/03/2016 CT and MR and prior  studies FINDINGS: Brain: No evidence of acute infarction, hemorrhage, hydrocephalus, extra-axial collection or mass lesion/mass effect. Mild chronic small-vessel white matter ischemic  changes and remote right basal ganglia/ internal capsule infarct again noted. Vascular: Intracranial atherosclerotic calcifications noted. Skull: Normal. Negative for fracture or focal lesion. Sinuses/Orbits: No acute finding. Other: None IMPRESSION: No evidence of acute intracranial abnormality. Mild chronic small-vessel white matter ischemic changes and remote right basal ganglia/ internal capsule infarct. Electronically Signed   By: Margarette Canada M.D.   On: 08/16/2016 18:50   Mr Brain Wo Contrast  Result Date: 08/17/2016 CLINICAL DATA:  71 year old female with slurred speech in weakness. EXAM: MRI HEAD WITHOUT CONTRAST TECHNIQUE: Multiplanar, multiecho pulse sequences of the brain and surrounding structures were obtained without intravenous contrast. COMPARISON:  Head CT without contrast 08/16/2016. Brain MRI and intracranial MRA 06/03/2016. FINDINGS: Brain: Expected evolution of the diffusion restriction in the left splenium of the corpus callosum since March. New Patchy and confluent restricted diffusion along the left body of the corpus callosum and involving some of the adjacent left cingulate gyrus white matter (series 3, image 91). There is also occasional more superior subcortical anterior and posterior frontal white matter involvement (series 3, images 100 and 96). Associated T2 and FLAIR hyperintensity. No associated hemorrhage or mass effect. No contralateral right hemisphere or posterior fossa restricted diffusion. Chronic right corona radiata and basal ganglia lacunar infarcts. T2 heterogeneity in the other deep gray matter nuclei including both thalami suggesting chronic small vessel disease. Chronic microhemorrhage in the right caudate nucleus. Chronic Patchy T2 and FLAIR hyperintensity in the pons. No midline shift, mass effect, evidence of mass lesion, ventriculomegaly, extra-axial collection or acute intracranial hemorrhage. Cervicomedullary junction and pituitary are within normal limits.  Vascular: Major intracranial vascular flow voids are stable since March. Skull and upper cervical spine: Negative. Visualized bone marrow signal is within normal limits. Sinuses/Orbits: Stable an negative. Other: Mastoids remain clear.  Negative scalp soft tissues. IMPRESSION: 1. Scattered acute small infarcts in the left hemisphere, primarily the left ACA territory. No associated hemorrhage or mass effect. 2. Expected evolution of the left splenium infarct since March, which could have been due to distal left ACA ischemia. 3. Underlying bilateral chronic small vessel ischemia especially in the deep gray matter nuclei. Electronically Signed   By: Genevie Ann M.D.   On: 08/17/2016 12:39     Microbiology:       Recent Results (from the past 240 hour(s))  Urine culture     Status: Abnormal   Collection Time: 08/16/16  9:16 PM  Result Value Ref Range Status   Specimen Description URINE, CLEAN CATCH  Final   Special Requests NONE  Final   Culture MULTIPLE SPECIES PRESENT, SUGGEST RECOLLECTION (A)  Final   Report Status 08/18/2016 FINAL  Final     Labs: Basic Metabolic Panel:  Last Labs    Recent Labs Lab 08/16/16 1713  NA 140  K 3.6  CL 107  CO2 25  GLUCOSE 119*  BUN 13  CREATININE 1.02*  CALCIUM 9.6     CBC:  Last Labs    Recent Labs Lab 08/16/16 1713  WBC 6.7  NEUTROABS 3.7  HGB 11.4*  HCT 35.7*  MCV 83.8  PLT 272      CBG:  Last Labs    Recent Labs Lab 08/17/16 1141 08/17/16 1621 08/17/16 2151 08/18/16 0750 08/18/16 1140  GLUCAP 113* 148* 112* 110* 129*      Principal Problem:   Slurred speech Active  Problems:   Essential hypertension   Hyperlipidemia   CVA (cerebral vascular accident) ([place]) 3/18   Dysphagia   UTI (urinary tract infection)   Pressure injury of skin   Acute CVA (cerebrovascular accident) [facility])   Time coordinating discharge: 45 minutes  Signed:  Murray Hodgkins, MD [facility] 08/18/2016, 3:29  PM

## 2016-08-18 NOTE — Evaluation (Signed)
Clinical/Bedside Swallow Evaluation Patient Details  Name: April Page MRN: [id number] Date of Birth: [birth year]  Today's Date: 08/18/2016 Time: SLP Start Time (ACUTE ONLY): 0830 SLP Stop Time (ACUTE ONLY): 0900 SLP Time Calculation (min) (ACUTE ONLY): 30 min  Past Medical History:  Past Medical History:  Diagnosis Date  . Arthritis    stiff knees  . Blood dyscrasia   . Cancer ([place])   . CVA (cerebral vascular accident) ([place])   . GERD (gastroesophageal reflux disease)   . H/O: CML (chronic myeloid leukemia)   . Hyperlipidemia   . Hypertension   . Obesity   . Pessary maintenance 07/01/2013   Past Surgical History:  Past Surgical History:  Procedure Laterality Date  . ABDOMINAL HYSTERECTOMY    . CHOLECYSTECTOMY    . COLONOSCOPY  06/09/2002   SHF:WYOVZCHY hemorrhoids; otherwise normal rectum, colon   . COLONOSCOPY N/A 08/25/2013   IFO:YDXAJOI diverticulosis. Single colonic polyp-removed  s/p segmental biopsy and stool sample. random colon bx negative. +benign leiomyoma.  . cystocele/rectocele repair  2009  . ESOPHAGOGASTRODUODENOSCOPY  06/09/2002   NOM:VEHMCN upper gastrointestinal tract s/p  54-French Maloney dilator  . FEMUR IM NAIL Right 04/12/2016   Procedure: INTRAMEDULLARY (IM) NAIL FEMORAL;  Surgeon: Nicholes Stairs, MD;  Location: [facility];  Service: Orthopedics;  Laterality: Right;  . HERNIA REPAIR    . OOPHORECTOMY     HPI:  April Littles McCollumis a 71 y.o.femalewith medical history significant of CVA in march 2018 with residual slurred speech, HTN, HLD comes in with notable worsening slurred speech by her daughters today. No new medications. No numbness, tingling or weakness anywhere per patient. No vision changes. No fever. No urinary symptoms. No cough. Pt was referred for admission for possible new stroke. MRI shows: Scattered acute small infarcts in the left hemisphere, primarily the left ACA territory. No associated hemorrhage or mass effect. Expected  evolution of the left splenium infarct since March, which could have been due to distal left ACA ischemia. Underlying bilateral chronic small vessel ischemia especially in the deep gray matter nuclei. SLE and BSE ordered as part of stroke protocol. Of note, Pt had barium swallow in February which showed age related esophageal dysmotility.   Assessment / Plan / Recommendation Clinical Impression  Clinical swallow evaluation completed at bedside with daughter present. Oral motor evaluation reveals mild right facial asymmetry with labial weakness; Pt with excessive oral secretions with reports of mild drooling at times per daughter. Cued swallow from SLP clears oral cavity. Pt's affect is flat and she is slow to respond, however she is alert and generally oriented (off date). Pt endorses occasional difficulty swallowing solids and points to sternum as source of stasis. Pt assessed with ice chips, water via cup and straw, puree, and regular textures. No overt signs of symptoms of decreased airway protection/aspiration noted on consistencies presented, however intake was limited. Pt reportedly vomited prior to SLP arrival and was given medication for nausea earlier. Dtr reports that Pt typically eats very rapidly at home, however this was not observed today. Recommend D3/mech soft and thin liquids and medications whole with water (may need to break or crush large pills). Ensure clear oral cavity. Limit distractions during meals. SLP will follow in acute setting. SLE to follow.  SLP Visit Diagnosis: Dysphagia, oropharyngeal phase (R13.12)    Aspiration Risk  Mild aspiration risk    Diet Recommendation Dysphagia 3 (Mech soft);Thin liquid   Liquid Administration via: Cup;Straw Medication Administration: Whole meds with liquid Supervision: Patient  able to self feed;Intermittent supervision to cue for compensatory strategies Compensations: Minimize environmental distractions;Slow rate (Dtr reports rapid intake  at home-not observed today) Postural Changes: Seated upright at 90 degrees;Remain upright for at least 30 minutes after po intake    Other  Recommendations Recommended Consults: Consider esophageal assessment (for motility) Oral Care Recommendations: Oral care BID;Staff/trained caregiver to provide oral care Other Recommendations: Clarify dietary restrictions   Follow up Recommendations Home health SLP      Frequency and Duration min 2x/week  1 week       Prognosis Prognosis for Safe Diet Advancement: Fair Barriers to Reach Goals: Cognitive deficits;Motivation      Swallow Study   General Date of Onset: 08/16/16 HPI: April Bradt McCollumis a 71 y.o.femalewith medical history significant of CVA in march 2018 with residual slurred speech, HTN, HLD comes in with notable worsening slurred speech by her daughters today. No new medications. No numbness, tingling or weakness anywhere per patient. No vision changes. No fever. No urinary symptoms. No cough. Pt was referred for admission for possible new stroke. MRI shows: Scattered acute small infarcts in the left hemisphere, primarily the left ACA territory. No associated hemorrhage or mass effect. Expected evolution of the left splenium infarct since March, which could have been due to distal left ACA ischemia. Underlying bilateral chronic small vessel ischemia especially in the deep gray matter nuclei. SLE and BSE ordered as part of stroke protocol. Of note, Pt had barium swallow in February which showed age related esophageal dysmotility. Type of Study: Bedside Swallow Evaluation Previous Swallow Assessment: None on record; Did have BaSw in February-dysmotility Diet Prior to this Study: Thin liquids;Dysphagia 1 (puree) Temperature Spikes Noted: No Respiratory Status: Room air History of Recent Intubation: No Behavior/Cognition: Alert;Cooperative;Pleasant mood (flat affect) Oral Cavity Assessment: Excessive secretions Oral Care  Completed by SLP: Yes Oral Cavity - Dentition: Adequate natural dentition;Missing dentition Vision: Functional for self-feeding Self-Feeding Abilities: Able to feed self Patient Positioning: Upright in bed Baseline Vocal Quality: Normal;Low vocal intensity Volitional Cough: Weak Volitional Swallow: Able to elicit    Oral/Motor/Sensory Function Overall Oral Motor/Sensory Function: Mild impairment Facial ROM: Reduced right Facial Symmetry: Abnormal symmetry right Facial Strength: Reduced right Facial Sensation: Within Functional Limits Lingual ROM: Within Functional Limits Lingual Symmetry: Within Functional Limits Lingual Strength: Within Functional Limits Lingual Sensation: Within Functional Limits Velum: Within Functional Limits Mandible: Within Functional Limits   Ice Chips Ice chips: Within functional limits Presentation: Spoon   Thin Liquid Thin Liquid: Within functional limits Presentation: Cup;Self Fed;Straw    Nectar Thick Nectar Thick Liquid: Not tested   Honey Thick Honey Thick Liquid: Not tested   Puree Puree: Within functional limits Presentation: Spoon   Solid   Thank you,  Genene Churn, CCC-SLP [phone]    Solid: Within functional limits Presentation: Self Fed        Winfield Caba 08/18/2016,9:47 AM

## 2016-08-18 NOTE — Evaluation (Signed)
Speech Language Pathology Evaluation Patient Details Name: April Page MRN: [id number] DOB: [birth year] Today's Date: 08/18/2016 Time: 8937-3428 SLP Time Calculation (min) (ACUTE ONLY): 27 min  Problem List:  Patient Active Problem List   Diagnosis Date Noted  . Pressure injury of skin 08/17/2016  . Acute CVA (cerebrovascular accident) ([place]) 08/17/2016  . UTI (urinary tract infection) 08/16/2016  . Constipation 07/10/2016  . Dysphagia 07/10/2016  . Loss of weight 07/10/2016  . Impaired glucose tolerance 06/05/2016  . Slurred speech   . CVA (cerebral vascular accident) ([place]) 3/18 06/03/2016  . Lightheadedness 04/13/2016  . Hypokalemia 04/13/2016  . Hyperglycemia 04/13/2016  . Essential hypertension 04/13/2016  . Hyperlipidemia 04/13/2016  . S/P right hip fracture 04/12/2016  . Hip fracture ([place]) 04/12/2016  . Fall   . Itching in the vaginal area 10/05/2014  . Chronic myelogenous leukemia (CML), BCR-ABL1-positive ([place])   . GERD (gastroesophageal reflux disease) 12/06/2013  . Leukocytosis 08/06/2013  . Atypical chest pain 08/06/2013  . Chronic diarrhea 08/05/2013  . Pessary maintenance 07/01/2013   Past Medical History:  Past Medical History:  Diagnosis Date  . Arthritis    stiff knees  . Blood dyscrasia   . Cancer ([place])   . CVA (cerebral vascular accident) ([place])   . GERD (gastroesophageal reflux disease)   . H/O: CML (chronic myeloid leukemia)   . Hyperlipidemia   . Hypertension   . Obesity   . Pessary maintenance 07/01/2013   Past Surgical History:  Past Surgical History:  Procedure Laterality Date  . ABDOMINAL HYSTERECTOMY    . CHOLECYSTECTOMY    . COLONOSCOPY  06/09/2002   JGO:TLXBWIOM hemorrhoids; otherwise normal rectum, colon   . COLONOSCOPY N/A 08/25/2013   BTD:HRCBULA diverticulosis. Single colonic polyp-removed  s/p segmental biopsy and stool sample. random colon bx negative. +benign leiomyoma.  . cystocele/rectocele repair  2009  .  ESOPHAGOGASTRODUODENOSCOPY  06/09/2002   GTX:MIWOEH upper gastrointestinal tract s/p  54-French Maloney dilator  . FEMUR IM NAIL Right 04/12/2016   Procedure: INTRAMEDULLARY (IM) NAIL FEMORAL;  Surgeon: Nicholes Stairs, MD;  Location: [facility];  Service: Orthopedics;  Laterality: Right;  . HERNIA REPAIR    . OOPHORECTOMY     HPI:  April Page a 71 y.o.femalewith medical history significant of CVA in march 2018 with residual slurred speech, HTN, HLD comes in with notable worsening slurred speech by her daughters today. No new medications. No numbness, tingling or weakness anywhere per patient. No vision changes. No fever. No urinary symptoms. No cough. Pt was referred for admission for possible new stroke. MRI shows: Scattered acute small infarcts in the left hemisphere, primarily the left ACA territory. No associated hemorrhage or mass effect. Expected evolution of the left splenium infarct since March, which could have been due to distal left ACA ischemia. Underlying bilateral chronic small vessel ischemia especially in the deep gray matter nuclei. SLE and BSE ordered as part of stroke protocol. Of note, Pt had barium swallow in February which showed age related esophageal dysmotility.   Assessment / Plan / Recommendation Clinical Impression  SLE completed following BSE this date. Daughter present for both and acknowledges that Pt is not back to baseline. Her daughter reports gradual decline since hip fracture in February. Ms. April Page was alert and cooperative throughout the evaluation, however her affect remained flat, initiation low, and responses delayed. Pt presents with mild to mild/mod cognitive linguistic deficits characterized by deficits in verbal expression, memory, attention, and complex auditory comprehension. Pt reports feeling back to "  normal", however question Pt insight into impairments. Recommend f/u SLP services at next level of care- likely home health or outpatient  depending on PT needs.    SLP Assessment  SLP Recommendation/Assessment: All further Speech Lanaguage Pathology  needs can be addressed in the next venue of care SLP Visit Diagnosis: Cognitive communication deficit (R41.841)    Follow Up Recommendations  Home health SLP;Outpatient SLP    Frequency and Duration min 2x/week  1 week      SLP Evaluation Cognition  Overall Cognitive Status: Within Functional Limits for tasks assessed Arousal/Alertness: Awake/alert Orientation Level: Oriented to person;Oriented to place;Disoriented to time Attention: Selective Selective Attention: Impaired Selective Attention Impairment: Verbal complex;Functional complex Memory: Impaired Memory Impairment: Storage deficit;Retrieval deficit Awareness: Impaired Awareness Impairment: Intellectual impairment Problem Solving: Appears intact Executive Function: Initiating;Decision Making Decision Making: Impaired Decision Making Impairment: Verbal complex;Functional complex Initiating:  (flat affect) Safety/Judgment: Impaired Comments: reduced insight into deficits       Comprehension  Auditory Comprehension Overall Auditory Comprehension: Impaired Yes/No Questions: Within Functional Limits Commands: Impaired Complex Commands: 75-100% accurate Conversation: Simple Other Conversation Comments: delayed response time Interfering Components: Attention;Processing speed EffectiveTechniques: Repetition Visual Recognition/Discrimination Discrimination: Within Function Limits Reading Comprehension Reading Status: Within funtional limits    Expression Expression Primary Mode of Expression: Verbal Verbal Expression Overall Verbal Expression: Impaired Initiation:  (limited initiation with staff) Automatic Speech: Name;Social Response;Day of week;Month of year Level of Generative/Spontaneous Verbalization: Conversation Repetition: Impaired Level of Impairment: Sentence level Naming:  Impairment Responsive: 76-100% accurate Confrontation: Within functional limits Convergent: 75-100% accurate Divergent: Not tested Pragmatics:  (flat affect) Interfering Components: Attention;Speech intelligibility Non-Verbal Means of Communication: Not applicable Written Expression Dominant Hand: Right Written Expression: Not tested   Oral / Motor  Oral Motor/Sensory Function Overall Oral Motor/Sensory Function: Mild impairment Facial ROM: Reduced right Facial Symmetry: Abnormal symmetry right Facial Strength: Reduced right Facial Sensation: Within Functional Limits Lingual ROM: Within Functional Limits Lingual Symmetry: Within Functional Limits Lingual Strength: Within Functional Limits Lingual Sensation: Within Functional Limits Velum: Within Functional Limits Mandible: Within Functional Limits Motor Speech Overall Motor Speech: Impaired Respiration: Within functional limits Phonation: Normal;Low vocal intensity Resonance: Within functional limits Articulation: Impaired Level of Impairment: Sentence Intelligibility: Intelligibility reduced Word: 75-100% accurate Phrase: 75-100% accurate Sentence: 75-100% accurate Conversation: 75-100% accurate Motor Planning: Witnin functional limits Motor Speech Errors: Inconsistent Effective Techniques: Slow rate;Increased vocal intensity;Over-articulate                      Thank you,  Genene Churn, [place]  [place] 08/18/2016, 10:03 AM

## 2016-08-18 NOTE — Progress Notes (Deleted)
Physician Discharge Summary  April Page EZM:[id number] DOB: 71-Jun-1947 DOA: 08/16/2016  PCP: Redmond School, MD  Admit date: 08/16/2016 Discharge date: 08/18/2016  Recommendations for Outpatient Follow-up:  1. Follow-up recurrent stroke. Follow-up with neurology one month. In the interim, obtain outpatient event monitor to rule out occult arrhythmia. 2. Outpatient physical therapy and speech therapy for both cognitive and swallowing therapy.  Follow-up Information    Phillips Odor, MD. Schedule an appointment as soon as possible for a visit in 1 month(s).   Specialty:  Neurology Contact information: [address] [place] [address] [phone]           Follow-up Information    Phillips Odor, MD. Schedule an appointment as soon as possible for a visit in 1 month(s).   Specialty:  Neurology Contact information: [address] [place] [address] [phone]             Discharge Diagnoses:  1. Acute CVA, multiple small infarcts left hemisphere of the brain, predominantly in left ACA territory; associated symptoms expressive aphasia and dysphagia 2. Hypertension 3. Hyperlipidemia  Discharge Condition: improved Disposition: home with outpatient PT  Diet recommendation:  Dysphagia 3 (Mech soft);Thin liquid   Liquid Administration via: Cup;Straw Medication Administration: Whole meds with liquid Supervision: Patient able to self feed;Intermittent supervision to cue for compensatory strategies Compensations: Minimize environmental distractions;Slow rate (Dtr reports rapid intake at home-not observed today) Postural Changes: Seated upright at 90 degrees;Remain upright for at least 30 minutes after po intake   Filed Weights   08/16/16 1750 08/16/16 2226  Weight: 67.6 kg (149 lb) 66.1 kg (145 lb 11.6 oz)    History of present illness:  71 year-old woman PMH CVA with residual slurred speech, hypertension, hyperlipidemia presented with worsening  slurred speech per family. No other focal deficits noted. Admitted for slurred speech, rule out stroke.  Hospital Course:  Patient was observed overnight, she subsequently agreed to obtain an MRI the following day which revealed scattered acute small infarcts in the left hemisphere primarily in the left ACA territory. Symptoms were expressive aphasia, dysphagia. Remainder of workup was completed back in March with her initial stroke and was not repeated. Case was discussed with neurologist on call Dr. Shon Hale as neurology not available until 6/11. He recommended continuing aspirin, Plavix. He recommended outpatient cardiac event monitor and follow-up with the patient's primary neurologist. Patient was seen by speech therapy with recommendations for mechanical soft diet and thin liquids. She was also seen by physical therapy and outpatient physical therapy was recommended.  #1 CVA/Acute small infarcts in the left hemisphere the brain. LDL 222 06/04/2016. Hemoglobin A1c 6.4 06/03/2016.  #2 Hypertension. Permissive hypertension. Plan on resuming antihypertensives on discharge.  #3 Hyperlipidemia. Continue statin  #4 Abnormal urinalysis. Culture multiple species, non-predominant.   Procedures:  Echo 06/04/2016 Study Conclusions  - Left ventricle: The cavity size was normal. Wall thickness was   increased in a pattern of moderate LVH. Systolic function was   normal. The estimated ejection fraction was in the range of 60%   to 65%. Doppler parameters are consistent with abnormal left   ventricular relaxation (grade 1 diastolic dysfunction). - Aortic valve: Mildly calcified annulus. Normal thickness   leaflets. Valve area (VTI): 2.61 cm^2. Valve area (Vmax): 2.18   cm^2. Valve area (Vmean): 2.46 cm^2. - Atrial septum: No defect or patent foramen ovale was identified. - Technically difficult study.   Bilateral carotid US 3/27 IMPRESSION: 1. Minimal bilateral carotid atherosclerotic vascular  disease.  No flow limiting stenosis. Degree of stenosis less than 50% Bilaterally.   MRA Head 3/26 IMPRESSION: 1. High-grade stenosis or occlusion of the left posterior cerebral artery proximally may account for the left corpus callosum infarct. 2. Moderate narrowing of the proximal right posterior cerebral artery. 3. Moderate to high-grade stenosis of the distal right M1 segment, just before the bifurcation. 4. Moderate stenoses of proximal left P2 segments at the left MCA bifurcation. 5. Moderate attenuation of MCA branch vessels, right worse than left. 6. Mild narrowing of the cavernous right internal carotid artery.  2. Vertebrals are patent with antegrade flow .  At time of study, neurologist consulted and recommended no intervention for stenoses documented above.  Today's assessment: S: Vomited earlier, but feels ok now. No new neurologic symptoms. Did well with physical therapy yesterday. Speech therapy recommendations. O: Vitals: Afebrile, temperature 98.2, respirations 15, pulse 65, blood pressure 190/83   Constitutional: Appears calm, comfortable.  Psychiatric: Flat affect, appears depressed. Speech is slow but clear without obvious word finding difficulty.  Neurologic: Cranial nerves II-XII appear intact, note the patient has slight asymmetry in pupil size which is stable from yesterday. No pronator drift. No upper extremity dysdiadochokinesis. Grossly normal sensation in all extremities.  Musculoskeletal: Tone and strength all joints appears grossly normal.  Cardiovascular: Regular rate and rhythm. No murmur, rub or gallop. No lower extremity edema.  Respiratory: Clear to auscultation bilaterally. No wheezes, rales or rhonchi. Normal respiratory effort.  Discharge Instructions  Discharge Instructions    Ambulatory referral to Speech Therapy    Complete by:  As directed    Language and swallowing therapy   Diet - low sodium heart healthy    Complete by:  As  directed    Discharge instructions    Complete by:  As directed    Call your physician or seek immediate medical attention for difficulty speaking, swallowing, breathing; weakness, shortness of breath, numbness or worsening of condition.   Increase activity slowly    Complete by:  As directed      Allergies as of 08/18/2016   No Known Allergies     Medication List    TAKE these medications   aspirin 325 MG tablet Take 1 tablet (325 mg total) by mouth daily.   atorvastatin 40 MG tablet Commonly known as:  LIPITOR Take 1 tablet (40 mg total) by mouth daily at 6 PM.   clopidogrel 75 MG tablet Commonly known as:  PLAVIX Take 1 tablet (75 mg total) by mouth daily.   losartan 100 MG tablet Commonly known as:  COZAAR Take 1 tablet (100 mg total) by mouth daily. Resume on 3/28   multivitamin with minerals Tabs tablet Take 1 tablet by mouth daily.   nilotinib 150 MG capsule Commonly known as:  TASIGNA Take 2 capsules (300 mg total) by mouth every 12 (twelve) hours.   polyethylene glycol packet Commonly known as:  MIRALAX / GLYCOLAX Take 17 g by mouth daily.      No Known Allergies  The results of significant diagnostics from this hospitalization (including imaging, microbiology, ancillary and laboratory) are listed below for reference.    Significant Diagnostic Studies: Ct Head Wo Contrast  Result Date: 08/16/2016 CLINICAL DATA:  71 year old female with slurred speech and weakness today. EXAM: CT HEAD WITHOUT CONTRAST TECHNIQUE: Contiguous axial images were obtained from the base of the skull through the vertex without intravenous contrast. COMPARISON:  06/03/2016 CT and MR and prior studies FINDINGS: Brain: No evidence of acute infarction,  hemorrhage, hydrocephalus, extra-axial collection or mass lesion/mass effect. Mild chronic small-vessel white matter ischemic changes and remote right basal ganglia/ internal capsule infarct again noted. Vascular: Intracranial  atherosclerotic calcifications noted. Skull: Normal. Negative for fracture or focal lesion. Sinuses/Orbits: No acute finding. Other: None IMPRESSION: No evidence of acute intracranial abnormality. Mild chronic small-vessel white matter ischemic changes and remote right basal ganglia/ internal capsule infarct. Electronically Signed   By: Margarette Canada M.D.   On: 08/16/2016 18:50   Mr Brain Wo Contrast  Result Date: 08/17/2016 CLINICAL DATA:  71 year old female with slurred speech in weakness. EXAM: MRI HEAD WITHOUT CONTRAST TECHNIQUE: Multiplanar, multiecho pulse sequences of the brain and surrounding structures were obtained without intravenous contrast. COMPARISON:  Head CT without contrast 08/16/2016. Brain MRI and intracranial MRA 06/03/2016. FINDINGS: Brain: Expected evolution of the diffusion restriction in the left splenium of the corpus callosum since March. New Patchy and confluent restricted diffusion along the left body of the corpus callosum and involving some of the adjacent left cingulate gyrus white matter (series 3, image 91). There is also occasional more superior subcortical anterior and posterior frontal white matter involvement (series 3, images 100 and 96). Associated T2 and FLAIR hyperintensity. No associated hemorrhage or mass effect. No contralateral right hemisphere or posterior fossa restricted diffusion. Chronic right corona radiata and basal ganglia lacunar infarcts. T2 heterogeneity in the other deep gray matter nuclei including both thalami suggesting chronic small vessel disease. Chronic microhemorrhage in the right caudate nucleus. Chronic Patchy T2 and FLAIR hyperintensity in the pons. No midline shift, mass effect, evidence of mass lesion, ventriculomegaly, extra-axial collection or acute intracranial hemorrhage. Cervicomedullary junction and pituitary are within normal limits. Vascular: Major intracranial vascular flow voids are stable since March. Skull and upper cervical spine:  Negative. Visualized bone marrow signal is within normal limits. Sinuses/Orbits: Stable an negative. Other: Mastoids remain clear.  Negative scalp soft tissues. IMPRESSION: 1. Scattered acute small infarcts in the left hemisphere, primarily the left ACA territory. No associated hemorrhage or mass effect. 2. Expected evolution of the left splenium infarct since March, which could have been due to distal left ACA ischemia. 3. Underlying bilateral chronic small vessel ischemia especially in the deep gray matter nuclei. Electronically Signed   By: Genevie Ann M.D.   On: 08/17/2016 12:39    Microbiology: Recent Results (from the past 240 hour(s))  Urine culture     Status: Abnormal   Collection Time: 08/16/16  9:16 PM  Result Value Ref Range Status   Specimen Description URINE, CLEAN CATCH  Final   Special Requests NONE  Final   Culture MULTIPLE SPECIES PRESENT, SUGGEST RECOLLECTION (A)  Final   Report Status 08/18/2016 FINAL  Final     Labs: Basic Metabolic Panel:  Recent Labs Lab 08/16/16 1713  NA 140  K 3.6  CL 107  CO2 25  GLUCOSE 119*  BUN 13  CREATININE 1.02*  CALCIUM 9.6   CBC:  Recent Labs Lab 08/16/16 1713  WBC 6.7  NEUTROABS 3.7  HGB 11.4*  HCT 35.7*  MCV 83.8  PLT 272    CBG:  Recent Labs Lab 08/17/16 1141 08/17/16 1621 08/17/16 2151 08/18/16 0750 08/18/16 1140  GLUCAP 113* 148* 112* 110* 129*    Principal Problem:   Slurred speech Active Problems:   Essential hypertension   Hyperlipidemia   CVA (cerebral vascular accident) ([place]) 3/18   Dysphagia   UTI (urinary tract infection)   Pressure injury of skin   Acute CVA (cerebrovascular  accident) [facility])   Time coordinating discharge: 45 minutes  Signed:  Murray Hodgkins, MD [facility] 08/18/2016, 3:29 PM

## 2016-08-18 NOTE — Progress Notes (Signed)
Alison Murray discharged Home per MD order.  Discharge instructions reviewed and discussed with the patient, all questions and concerns answered. Copy of instructions given to patient. Home med, Rae Lips, returned to pt.  Allergies as of 08/18/2016   No Known Allergies     Medication List    TAKE these medications   aspirin 325 MG tablet Take 1 tablet (325 mg total) by mouth daily.   atorvastatin 40 MG tablet Commonly known as:  LIPITOR Take 1 tablet (40 mg total) by mouth daily at 6 PM.   clopidogrel 75 MG tablet Commonly known as:  PLAVIX Take 1 tablet (75 mg total) by mouth daily.   losartan 100 MG tablet Commonly known as:  COZAAR Take 1 tablet (100 mg total) by mouth daily. Resume on 3/28   multivitamin with minerals Tabs tablet Take 1 tablet by mouth daily.   nilotinib 150 MG capsule Commonly known as:  TASIGNA Take 2 capsules (300 mg total) by mouth every 12 (twelve) hours.   polyethylene glycol packet Commonly known as:  MIRALAX / GLYCOLAX Take 17 g by mouth daily.       Patients skin is clean, dry and intact, no evidence of skin break down. IV site discontinued and catheter remains intact. Site without signs and symptoms of complications. Dressing and pressure applied.  Patient escorted to car by NT in a wheelchair,  no distress noted upon discharge.  Ralene Muskrat Ranon Coven 08/18/2016 4:14 PM

## 2016-08-19 ENCOUNTER — Ambulatory Visit (HOSPITAL_COMMUNITY): Payer: Medicare HMO | Admitting: Physical Therapy

## 2016-08-19 DIAGNOSIS — R278 Other lack of coordination: Secondary | ICD-10-CM | POA: Diagnosis not present

## 2016-08-19 DIAGNOSIS — R29898 Other symptoms and signs involving the musculoskeletal system: Secondary | ICD-10-CM

## 2016-08-19 DIAGNOSIS — M6281 Muscle weakness (generalized): Secondary | ICD-10-CM | POA: Diagnosis not present

## 2016-08-19 DIAGNOSIS — R2681 Unsteadiness on feet: Secondary | ICD-10-CM | POA: Diagnosis not present

## 2016-08-19 DIAGNOSIS — R262 Difficulty in walking, not elsewhere classified: Secondary | ICD-10-CM | POA: Diagnosis not present

## 2016-08-19 NOTE — Therapy (Addendum)
[place], [place], [address] Phone: [phone]   Fax:  [phone]  Physical Therapy Treatment  Patient Details  Name: April Page MRN: [id number] Date of Birth: [birth year] Referring Provider: Nicholes Stairs   Encounter Date: 08/19/2016      PT End of Session - 08/19/16 1710    Visit Number 10   Number of Visits 15   Date for PT Re-Evaluation 09/05/16   Authorization Type [employer] H[employer]  (G-codes done 7th visit)   Authorization Time Period 07/15/16 to 09/14/16   Authorization - Visit Number 10   Authorization - Number of Visits 17   PT Start Time 8786   PT Stop Time 1648   PT Time Calculation (min) 44 min   Equipment Utilized During Treatment Gait belt   Activity Tolerance Patient tolerated treatment well   Behavior During Therapy [facility] for tasks assessed/performed      Past Medical History:  Diagnosis Date  . Arthritis    stiff knees  . Blood dyscrasia   . Cancer ([place])   . CVA (cerebral vascular accident) ([place])   . GERD (gastroesophageal reflux disease)   . H/O: CML (chronic myeloid leukemia)   . Hyperlipidemia   . Hypertension   . Obesity   . Pessary maintenance 07/01/2013    Past Surgical History:  Procedure Laterality Date  . ABDOMINAL HYSTERECTOMY    . CHOLECYSTECTOMY    . COLONOSCOPY  06/09/2002   VEH:MCNOBSJG hemorrhoids; otherwise normal rectum, colon   . COLONOSCOPY N/A 08/25/2013   GEZ:MOQHUTM diverticulosis. Single colonic polyp-removed  s/p segmental biopsy and stool sample. random colon bx negative. +benign leiomyoma.  . cystocele/rectocele repair  2009  . ESOPHAGOGASTRODUODENOSCOPY  06/09/2002   LYY:TKPTWS upper gastrointestinal tract s/p  54-French Maloney dilator  . FEMUR IM NAIL Right 04/12/2016   Procedure: INTRAMEDULLARY (IM) NAIL FEMORAL;  Surgeon: Nicholes Stairs, MD;  Location: [facility];  Service: Orthopedics;  Laterality: Right;  . HERNIA REPAIR    .  OOPHORECTOMY      There were no vitals filed for this visit.      Subjective Assessment - 08/19/16 1612    Subjective PT went to ED on Friday due to "slurred speech" and was admitted with resultant Acute CVA, multiple small infarcts left hemisphere of the brain, predominantly in left ACA territory; associated symptoms expressive aphasia and dysphagia.  Pt was just discharged yesterday.  Accompanied by her daughter this visit.   Currently in Pain? No/denies            [facility] PT Assessment - 08/19/16 0001      Strength   Right Hip Flexion --   Right Hip Extension --   Right Hip ABduction --   Left Hip Flexion --   Left Hip Extension --   Left Hip ABduction --   Right Knee Extension --   Left Knee Extension --   Right Ankle Dorsiflexion --   Left Ankle Dorsiflexion --                     [facility] Adult PT Treatment/Exercise - 08/19/16 0001      Ambulation/Gait   Ambulation Distance (Feet) 200 Feet  2 bouts of 100 feet   Assistive device Rolling walker   Gait Pattern Step-to pattern;Lateral trunk lean to right;Trunk flexed   Ambulation Surface Level;Indoor   Gait Comments min-mod assist  due to walker negotiation and safety  Posture/Postural Control   Posture Comments decreased control with Rt lateral/posterior lean in sitting     Knee/Hip Exercises: Standing   Other Standing Knee Exercises setting balance     Knee/Hip Exercises: Seated   Sit to Sand 5 reps                  PT Short Term Goals - 08/08/16 1627      PT SHORT TERM GOAL #1   Title Patient to be able to identify 5/5 safety precautions in order to reduce fall risk and improve overall safety    Baseline 5/31- 0/5, discussed    Time 4   Period Weeks   Status On-going     PT SHORT TERM GOAL #2   Title Patient to be able to ambulate household distances with LRAD and no unsteadiness in order to improve mobility and independence    Baseline 5/31- still using walker, not confident  with cane yet    Time 4   Period Weeks   Status On-going     PT SHORT TERM GOAL #3   Title Patient to show improved gait mechanics including equal step lengths, improved gait speed, equal weight bearing B LEs, with LRAD in order to show improvement of general condition adn reduce compensation patterns related to injury and CVA    Baseline 5/31- improving    Time 4   Period Weeks   Status On-going     PT SHORT TERM GOAL #4   Title Patient to be able to correctly and consistently perform HEP at least 5 days per week, to be updated as appropriate    Baseline 5/31- 3x/week    Time 1   Period Weeks   Status On-going           PT Long Term Goals - 08/08/16 1630      PT LONG TERM GOAL #1   Title Patient to show functional strength as being 4+/5 in order to improve mobilty and balance    Time 8   Period Weeks   Status On-going     PT LONG TERM GOAL #2   Title Patient to be able to ambulate household distances with no AD and no unsteadiness, as well as unlimited distances in community with LRAD and no unsteadiness, in order to show improved mobiltiy with minimal fall risk    Time 8   Period Weeks   Status On-going     PT LONG TERM GOAL #3   Title Patient to be able to complete TUG test in 15 seconds or less with LRAD in order to show improved balance and reduced fall risk    Baseline 5/31- 32   Time 8   Period Weeks   Status On-going     PT LONG TERM GOAL #4   Title Patient and family to report that she has safely been able to complete all functional strength and balance based tasks needed to be independent without safety concern in order to facilitate return to PLOF    Baseline 5/31- ongoing                Plan - 08/19/16 1714    Clinical Impression Statement Pt just discharged yesterday from [facility] due to Acute CVA, multiple small infarcts left hemisphere of the brain, predominantly in left ACA territory; associated symptoms expressive aphasia and dysphagia.   Daughter is with her today and reports her niece stayed with her last nigth but had to leave for  work this morning.  States patient fell coming out the the bathroom and patient does not know how it happened.  Pt with general weakness, instability and difficutly talking today . BP 150/99 (which she reports remains on high side).  PT with difficulty keeping her sitting balance, leaning posteriorly and to the Right.  Pt also with noted drooling during session.  Worked mainly on safety this session as she will need full re-evaluation next session following hospital visit (per Dalbert Batman, PT).  Sitting balance, safe transfers and gait using walker safely (pt tends to push too far out in front).   Rehab Potential Excellent   PT Frequency 2x / week   PT Duration 4 weeks   PT Treatment/Interventions ADLs/Self Care Home Management;Biofeedback;Cryotherapy;DME Instruction;Gait training;Stair training;Functional mobility training;Therapeutic activities;Therapeutic exercise;Balance training;Neuromuscular re-education;Patient/family education;Manual techniques;Energy conservation;Taping   PT Next Visit Plan Re-evaluate next session.   PT Home Exercise Plan Eval: bridges, supine hip ABD with red TB, sit to stand with UEs, regular walking program    Consulted and Agree with Plan of Care Patient      Patient will benefit from skilled therapeutic intervention in order to improve the following deficits and impairments:  Abnormal gait, Decreased coordination, Postural dysfunction, Decreased mobility, Decreased activity tolerance, Decreased strength, Decreased balance, Decreased safety awareness, Difficulty walking  Visit Diagnosis: Muscle weakness (generalized)  Difficulty in walking, not elsewhere classified  Unsteadiness on feet  Other lack of coordination  Other symptoms and signs involving the musculoskeletal system     Problem List Patient Active Problem List   Diagnosis Date Noted  . Pressure injury of  skin 08/17/2016  . Acute CVA (cerebrovascular accident) ([place]) 08/17/2016  . UTI (urinary tract infection) 08/16/2016  . Constipation 07/10/2016  . Dysphagia 07/10/2016  . Loss of weight 07/10/2016  . Impaired glucose tolerance 06/05/2016  . Slurred speech   . CVA (cerebral vascular accident) ([place]) 3/18 06/03/2016  . Lightheadedness 04/13/2016  . Hypokalemia 04/13/2016  . Hyperglycemia 04/13/2016  . Essential hypertension 04/13/2016  . Hyperlipidemia 04/13/2016  . S/P right hip fracture 04/12/2016  . Hip fracture ([place]) 04/12/2016  . Fall   . Itching in the vaginal area 10/05/2014  . Chronic myelogenous leukemia (CML), BCR-ABL1-positive ([place])   . GERD (gastroesophageal reflux disease) 12/06/2013  . Leukocytosis 08/06/2013  . Atypical chest pain 08/06/2013  . Chronic diarrhea 08/05/2013  . Pessary maintenance 07/01/2013    Teena Irani, PTA/CLT [phone]  08/19/2016, 5:20 PM  [facility] [address] [place], [place], [address] Phone: [phone]   Fax:  [phone]  Name: April Page MRN: [id number] Date of Birth: Apr 03, 1945

## 2016-08-20 ENCOUNTER — Emergency Department (HOSPITAL_COMMUNITY): Payer: Medicare HMO

## 2016-08-20 ENCOUNTER — Inpatient Hospital Stay (HOSPITAL_COMMUNITY)
Admission: EM | Admit: 2016-08-20 | Discharge: 2016-08-25 | DRG: 041 | Disposition: A | Discharge status: Skilled Nursing Facility | Payer: Medicare HMO | Attending: Internal Medicine | Admitting: Internal Medicine

## 2016-08-20 ENCOUNTER — Encounter (HOSPITAL_COMMUNITY): Payer: Self-pay | Admitting: Nurse Practitioner

## 2016-08-20 DIAGNOSIS — E119 Type 2 diabetes mellitus without complications: Secondary | ICD-10-CM | POA: Diagnosis not present

## 2016-08-20 DIAGNOSIS — Z7902 Long term (current) use of antithrombotics/antiplatelets: Secondary | ICD-10-CM | POA: Diagnosis not present

## 2016-08-20 DIAGNOSIS — Z7982 Long term (current) use of aspirin: Secondary | ICD-10-CM

## 2016-08-20 DIAGNOSIS — E1159 Type 2 diabetes mellitus with other circulatory complications: Secondary | ICD-10-CM | POA: Diagnosis not present

## 2016-08-20 DIAGNOSIS — E785 Hyperlipidemia, unspecified: Secondary | ICD-10-CM | POA: Diagnosis present

## 2016-08-20 DIAGNOSIS — R531 Weakness: Secondary | ICD-10-CM | POA: Diagnosis not present

## 2016-08-20 DIAGNOSIS — M1990 Unspecified osteoarthritis, unspecified site: Secondary | ICD-10-CM | POA: Diagnosis present

## 2016-08-20 DIAGNOSIS — R1310 Dysphagia, unspecified: Secondary | ICD-10-CM | POA: Diagnosis not present

## 2016-08-20 DIAGNOSIS — Q2112 Patent foramen ovale: Secondary | ICD-10-CM

## 2016-08-20 DIAGNOSIS — R1311 Dysphagia, oral phase: Secondary | ICD-10-CM | POA: Diagnosis present

## 2016-08-20 DIAGNOSIS — M6281 Muscle weakness (generalized): Secondary | ICD-10-CM | POA: Diagnosis not present

## 2016-08-20 DIAGNOSIS — E049 Nontoxic goiter, unspecified: Secondary | ICD-10-CM | POA: Diagnosis present

## 2016-08-20 DIAGNOSIS — C9210 Chronic myeloid leukemia, BCR/ABL-positive, not having achieved remission: Secondary | ICD-10-CM | POA: Diagnosis present

## 2016-08-20 DIAGNOSIS — I638 Other cerebral infarction: Secondary | ICD-10-CM | POA: Diagnosis present

## 2016-08-20 DIAGNOSIS — E1151 Type 2 diabetes mellitus with diabetic peripheral angiopathy without gangrene: Secondary | ICD-10-CM | POA: Diagnosis present

## 2016-08-20 DIAGNOSIS — I639 Cerebral infarction, unspecified: Secondary | ICD-10-CM | POA: Diagnosis present

## 2016-08-20 DIAGNOSIS — R5381 Other malaise: Secondary | ICD-10-CM | POA: Diagnosis not present

## 2016-08-20 DIAGNOSIS — R4781 Slurred speech: Secondary | ICD-10-CM | POA: Diagnosis present

## 2016-08-20 DIAGNOSIS — I10 Essential (primary) hypertension: Secondary | ICD-10-CM | POA: Diagnosis present

## 2016-08-20 DIAGNOSIS — Q211 Atrial septal defect: Secondary | ICD-10-CM | POA: Diagnosis not present

## 2016-08-20 DIAGNOSIS — S0990XA Unspecified injury of head, initial encounter: Secondary | ICD-10-CM | POA: Diagnosis not present

## 2016-08-20 DIAGNOSIS — Z8673 Personal history of transient ischemic attack (TIA), and cerebral infarction without residual deficits: Secondary | ICD-10-CM | POA: Diagnosis not present

## 2016-08-20 DIAGNOSIS — R079 Chest pain, unspecified: Secondary | ICD-10-CM | POA: Diagnosis not present

## 2016-08-20 DIAGNOSIS — G464 Cerebellar stroke syndrome: Secondary | ICD-10-CM | POA: Diagnosis not present

## 2016-08-20 DIAGNOSIS — K219 Gastro-esophageal reflux disease without esophagitis: Secondary | ICD-10-CM | POA: Diagnosis present

## 2016-08-20 DIAGNOSIS — Z79899 Other long term (current) drug therapy: Secondary | ICD-10-CM | POA: Diagnosis not present

## 2016-08-20 DIAGNOSIS — R29703 NIHSS score 3: Secondary | ICD-10-CM | POA: Diagnosis present

## 2016-08-20 DIAGNOSIS — N179 Acute kidney failure, unspecified: Secondary | ICD-10-CM | POA: Diagnosis present

## 2016-08-20 DIAGNOSIS — D759 Disease of blood and blood-forming organs, unspecified: Secondary | ICD-10-CM | POA: Diagnosis present

## 2016-08-20 DIAGNOSIS — R4182 Altered mental status, unspecified: Secondary | ICD-10-CM | POA: Diagnosis not present

## 2016-08-20 DIAGNOSIS — K529 Noninfective gastroenteritis and colitis, unspecified: Secondary | ICD-10-CM | POA: Diagnosis not present

## 2016-08-20 DIAGNOSIS — I69391 Dysphagia following cerebral infarction: Secondary | ICD-10-CM | POA: Diagnosis not present

## 2016-08-20 DIAGNOSIS — I69891 Dysphagia following other cerebrovascular disease: Secondary | ICD-10-CM | POA: Diagnosis not present

## 2016-08-20 DIAGNOSIS — R279 Unspecified lack of coordination: Secondary | ICD-10-CM | POA: Diagnosis not present

## 2016-08-20 LAB — COMPREHENSIVE METABOLIC PANEL
ALBUMIN: 3.8 g/dL (ref 3.5–5.0)
ALK PHOS: 132 U/L — AB (ref 38–126)
ALT: 37 U/L (ref 14–54)
AST: 50 U/L — AB (ref 15–41)
Anion gap: 10 (ref 5–15)
BILIRUBIN TOTAL: 1.2 mg/dL (ref 0.3–1.2)
BUN: 24 mg/dL — AB (ref 6–20)
CO2: 21 mmol/L — ABNORMAL LOW (ref 22–32)
Calcium: 9.6 mg/dL (ref 8.9–10.3)
Chloride: 110 mmol/L (ref 101–111)
Creatinine, Ser: 1.57 mg/dL — ABNORMAL HIGH (ref 0.44–1.00)
GFR calc Af Amer: 37 mL/min — ABNORMAL LOW (ref >60)
GFR, EST NON AFRICAN AMERICAN: 32 mL/min — AB (ref >60)
GLUCOSE: 127 mg/dL — AB (ref 65–99)
Potassium: 4.2 mmol/L (ref 3.5–5.1)
Sodium: 141 mmol/L (ref 135–145)
TOTAL PROTEIN: 7.3 g/dL (ref 6.5–8.1)

## 2016-08-20 LAB — DIFFERENTIAL
BASOS ABS: 0 10*3/uL (ref 0.0–0.1)
Basophils Relative: 0 %
EOS ABS: 0.2 10*3/uL (ref 0.0–0.7)
Eosinophils Relative: 3 %
LYMPHS ABS: 2.5 10*3/uL (ref 0.7–4.0)
LYMPHS PCT: 32 %
MONOS PCT: 9 %
Monocytes Absolute: 0.7 10*3/uL (ref 0.1–1.0)
NEUTROS PCT: 56 %
Neutro Abs: 4.4 10*3/uL (ref 1.7–7.7)

## 2016-08-20 LAB — CBC
HCT: 37.8 % (ref 36.0–46.0)
HCT: 36.8 % (ref 36.0–46.0)
HEMOGLOBIN: 12.2 g/dL (ref 12.0–15.0)
Hemoglobin: 11.7 g/dL — ABNORMAL LOW (ref 12.0–15.0)
MCH: 27.3 pg (ref 26.0–34.0)
MCH: 26.8 pg (ref 26.0–34.0)
MCHC: 32.3 g/dL (ref 30.0–36.0)
MCHC: 31.8 g/dL (ref 30.0–36.0)
MCV: 84.6 fL (ref 78.0–100.0)
MCV: 84.4 fL (ref 78.0–100.0)
PLATELETS: 262 10*3/uL (ref 150–400)
Platelets: 264 10*3/uL (ref 150–400)
RBC: 4.47 MIL/uL (ref 3.87–5.11)
RBC: 4.36 MIL/uL (ref 3.87–5.11)
RDW: 17 % — ABNORMAL HIGH (ref 11.5–15.5)
RDW: 16.9 % — ABNORMAL HIGH (ref 11.5–15.5)
WBC: 7.7 10*3/uL (ref 4.0–10.5)
WBC: 8.4 10*3/uL (ref 4.0–10.5)

## 2016-08-20 LAB — I-STAT CHEM 8, ED
BUN: 33 mg/dL — AB (ref 6–20)
CALCIUM ION: 1.06 mmol/L — AB (ref 1.15–1.40)
Chloride: 110 mmol/L (ref 101–111)
Creatinine, Ser: 1.5 mg/dL — ABNORMAL HIGH (ref 0.44–1.00)
GLUCOSE: 132 mg/dL — AB (ref 65–99)
HEMATOCRIT: 37 % (ref 36.0–46.0)
HEMOGLOBIN: 12.6 g/dL (ref 12.0–15.0)
Potassium: 4.2 mmol/L (ref 3.5–5.1)
Sodium: 141 mmol/L (ref 135–145)
TCO2: 27 mmol/L (ref 0–100)

## 2016-08-20 LAB — CBG MONITORING, ED: Glucose-Capillary: 131 mg/dL — ABNORMAL HIGH (ref 65–99)

## 2016-08-20 LAB — I-STAT TROPONIN, ED: TROPONIN I, POC: 0.06 ng/mL (ref 0.00–0.08)

## 2016-08-20 LAB — CREATININE, SERUM
CREATININE: 1.47 mg/dL — AB (ref 0.44–1.00)
GFR calc Af Amer: 40 mL/min — ABNORMAL LOW (ref >60)
GFR calc non Af Amer: 35 mL/min — ABNORMAL LOW (ref >60)

## 2016-08-20 LAB — PROTIME-INR
INR: 0.98
Prothrombin Time: 13 seconds (ref 11.4–15.2)

## 2016-08-20 LAB — APTT: APTT: 27 s (ref 24–36)

## 2016-08-20 MED ORDER — ACETAMINOPHEN 160 MG/5ML PO SOLN: 650.0000 mg | ORAL | Status: DC | PRN

## 2016-08-20 MED ORDER — ACETAMINOPHEN 325 MG PO TABS: 650.0000 mg | ORAL_TABLET | ORAL | Status: DC | PRN

## 2016-08-20 MED ORDER — ASPIRIN 300 MG RE SUPP
300.0000 mg | Freq: Every day | RECTAL | Status: DC
  Filled 2016-08-20: qty 1

## 2016-08-20 MED ORDER — ASPIRIN 325 MG PO TABS
325.0000 mg | ORAL_TABLET | Freq: Every day | ORAL | Status: DC
  Administered 2016-08-21 – 2016-08-25 (×4): 325 mg via ORAL
  Filled 2016-08-20 (×4): qty 1

## 2016-08-20 MED ORDER — SODIUM CHLORIDE 0.9 % IV SOLN
INTRAVENOUS | Status: AC
  Administered 2016-08-21: 01:00:00 via INTRAVENOUS

## 2016-08-20 MED ORDER — ACETAMINOPHEN 650 MG RE SUPP: 650.0000 mg | RECTAL | Status: DC | PRN

## 2016-08-20 MED ORDER — ENOXAPARIN SODIUM 40 MG/0.4ML ~~LOC~~ SOLN
40.0000 mg | Freq: Every day | SUBCUTANEOUS | Status: DC
  Administered 2016-08-21 – 2016-08-24 (×5): 40 mg via SUBCUTANEOUS
  Filled 2016-08-20 (×5): qty 0.4

## 2016-08-20 MED ORDER — STROKE: EARLY STAGES OF RECOVERY BOOK
Freq: Once | Status: AC
  Administered 2016-08-21: 01:00:00
  Filled 2016-08-20: qty 1

## 2016-08-20 NOTE — ED Notes (Signed)
Patient transported to MRI 

## 2016-08-20 NOTE — ED Notes (Signed)
ED Provider at bedside. 

## 2016-08-20 NOTE — H&P (Signed)
History and Physical    ERENDIRA Page HAL:[id number] DOB: [birth year] DOA: 08/20/2016  PCP: Redmond School, MD  Patient coming from: Home.  Chief Complaint: Difficulty walking.  HPI: April Page is a 71 y.o. female with history of stroke who was recently discharged on June 10 after being admitted for stroke was brought to the ER after patient's daughter found that patient was having increasing difficulty walking with increasing difficulty swallowing and also slurred speech. After discharge patient had a fall while walking to the bathroom 2 days ago. Following the fall patient has been found to have unstable gait and also having difficulty swallowing and slurred speech. Has no difficulty moving upper or lower extremities. Patient has been admitted in March of this year for stroke and had stroke workup.   ED Course: In the ER MRI of the brain shows new thalamic infarct extending to the posterior limb of the internal capsule. On-call neurologist Dr. Wallie Char was consulted and patient is being admitted for stroke workup. Patient failed swallow evaluation in the ER. On exam patient is able to move all extremities but has mild weakness of the right lower extremity.  Review of Systems: As per HPI, rest all negative.   Past Medical History:  Diagnosis Date  . Arthritis    stiff knees  . Blood dyscrasia   . Cancer ([place])   . CVA (cerebral vascular accident) ([place])   . GERD (gastroesophageal reflux disease)   . H/O: CML (chronic myeloid leukemia)   . Hyperlipidemia   . Hyperlipidemia   . Hypertension   . Obesity   . Pessary maintenance 07/01/2013    Past Surgical History:  Procedure Laterality Date  . ABDOMINAL HYSTERECTOMY    . CHOLECYSTECTOMY    . COLONOSCOPY  06/09/2002   BDZ:HGDJMEQA hemorrhoids; otherwise normal rectum, colon   . COLONOSCOPY N/A 08/25/2013   STM:HDQQIWL diverticulosis. Single colonic polyp-removed  s/p segmental biopsy and stool sample. random colon bx  negative. +benign leiomyoma.  . cystocele/rectocele repair  2009  . ESOPHAGOGASTRODUODENOSCOPY  06/09/2002   NLG:XQJJHE upper gastrointestinal tract s/p  54-French Maloney dilator  . FEMUR IM NAIL Right 04/12/2016   Procedure: INTRAMEDULLARY (IM) NAIL FEMORAL;  Surgeon: Nicholes Stairs, MD;  Location: [place];  Service: Orthopedics;  Laterality: Right;  . HERNIA REPAIR    . OOPHORECTOMY       reports that she has never smoked. She has never used smokeless tobacco. She reports that she does not drink alcohol or use drugs.  No Known Allergies  Family History  Problem Relation Age of Onset  . Anuerysm Mother        deceased age 15, brain anuerysm  . Early death Mother 48  . Heart disease Father   . Hypertension Sister   . Obesity Brother   . Hypertension Sister   . Arthritis Sister   . Other Son        cardiac arrest  . Heart disease Child 72       cardiac arrest  . Colon cancer Neg Hx     Prior to Admission medications   Medication Sig Start Date End Date Taking? Authorizing Provider  aspirin 325 MG tablet Take 1 tablet (325 mg total) by mouth daily. 06/05/16  Yes Kathie Dike, MD  atorvastatin (LIPITOR) 40 MG tablet Take 1 tablet (40 mg total) by mouth daily at 6 PM. 08/18/16  Yes Samuella Cota, MD  clopidogrel (PLAVIX) 75 MG tablet Take 1 tablet (75 mg  total) by mouth daily. 08/18/16  Yes Samuella Cota, MD  losartan (COZAAR) 100 MG tablet Take 1 tablet (100 mg total) by mouth daily. Resume on 3/28 Patient taking differently: Take 100 mg by mouth daily.  08/18/16  Yes Samuella Cota, MD  Multiple Vitamin (MULTIVITAMIN WITH MINERALS) TABS tablet Take 1 tablet by mouth daily.   Yes [provider]  nilotinib (TASIGNA) 150 MG capsule Take 2 capsules (300 mg total) by mouth every 12 (twelve) hours. 06/19/16  Yes [name], Manon Hilding, PA-C  polyethylene glycol (MIRALAX / GLYCOLAX) packet Take 17 g by mouth daily. 06/06/16  Deveron Furlong, MD    Physical  Exam: Vitals:   08/20/16 2130 08/20/16 2200 08/20/16 2245 08/20/16 2300  BP: (!) 160/84 (!) 159/86 (!) 175/83 (!) 159/71  Pulse: 66 61 65 (!) 59  Resp: 18 15 14 13   Temp:      TempSrc:      SpO2: 100% 99% 100% 99%  Weight:      Height:          Constitutional: Moderately built and nourished. Vitals:   08/20/16 2130 08/20/16 2200 08/20/16 2245 08/20/16 2300  BP: (!) 160/84 (!) 159/86 (!) 175/83 (!) 159/71  Pulse: 66 61 65 (!) 59  Resp: 18 15 14 13   Temp:      TempSrc:      SpO2: 100% 99% 100% 99%  Weight:      Height:       Eyes: Anicteric no pallor. Cataract present. ENMT: No discharge from the ears eyes nose and mouth. Neck: No mass felt. No neck rigidity. Respiratory: No rhonchi or crepitations. Cardiovascular: S1 and S2 heard no murmurs appreciated. Abdomen: Soft nontender bowel sounds present. Musculoskeletal: No edema. No joint effusion. Skin: No rash. Skin appears warm. Neurologic: Alert awake oriented to time place and person. Mild weakness of the right lower extremity. Moves all other extremities 5 x 5. No facial asymmetry tongue is midline. Pupils are equal and reacting to light. Psychiatric: Appears normal. Normal affect.   Labs on Admission: I have personally reviewed following labs and imaging studies  CBC:  Recent Labs Lab 08/16/16 1713 08/20/16 1634 08/20/16 1643  WBC 6.7 7.7  --   NEUTROABS 3.7 4.4  --   HGB 11.4* 12.2 12.6  HCT 35.7* 37.8 37.0  MCV 83.8 84.6  --   PLT 272 264  --    Basic Metabolic Panel:  Recent Labs Lab 08/16/16 1713 08/20/16 1634 08/20/16 1643  NA 140 141 141  K 3.6 4.2 4.2  CL 107 110 110  CO2 25 21*  --   GLUCOSE 119* 127* 132*  BUN 13 24* 33*  CREATININE 1.02* 1.57* 1.50*  CALCIUM 9.6 9.6  --    GFR: Estimated Creatinine Clearance: 33.5 mL/min (A) (by C-G formula based on SCr of 1.5 mg/dL (H)). Liver Function Tests:  Recent Labs Lab 08/20/16 1634  AST 50*  ALT 37  ALKPHOS 132*  BILITOT 1.2  PROT  7.3  ALBUMIN 3.8   No results for input(s): LIPASE, AMYLASE in the last 168 hours. No results for input(s): AMMONIA in the last 168 hours. Coagulation Profile:  Recent Labs Lab 08/20/16 1634  INR 0.98   Cardiac Enzymes: No results for input(s): CKTOTAL, CKMB, CKMBINDEX, TROPONINI in the last 168 hours. BNP (last 3 results) No results for input(s): PROBNP in the last 8760 hours. HbA1C: No results for input(s): HGBA1C in the last 72 hours. CBG:  Recent  Labs Lab 08/17/16 1621 08/17/16 2151 08/18/16 0750 08/18/16 1140 08/20/16 1633  GLUCAP 148* 112* 110* 129* 131*   Lipid Profile: No results for input(s): CHOL, HDL, LDLCALC, TRIG, CHOLHDL, LDLDIRECT in the last 72 hours. Thyroid Function Tests: No results for input(s): TSH, T4TOTAL, FREET4, T3FREE, THYROIDAB in the last 72 hours. Anemia Panel: No results for input(s): VITAMINB12, FOLATE, FERRITIN, TIBC, IRON, RETICCTPCT in the last 72 hours. Urine analysis:    Component Value Date/Time   COLORURINE YELLOW 08/16/2016 1850   APPEARANCEUR CLOUDY (A) 08/16/2016 1850   LABSPEC 1.010 08/16/2016 1850   PHURINE 5.0 08/16/2016 1850   GLUCOSEU NEGATIVE 08/16/2016 1850   HGBUR SMALL (A) 08/16/2016 1850   BILIRUBINUR NEGATIVE 08/16/2016 1850   KETONESUR NEGATIVE 08/16/2016 1850   PROTEINUR NEGATIVE 08/16/2016 1850   NITRITE NEGATIVE 08/16/2016 1850   LEUKOCYTESUR LARGE (A) 08/16/2016 1850   Sepsis Labs: @LABRCNTIP (procalcitonin:4,lacticidven:4) ) Recent Results (from the past 240 hour(s))  Urine culture     Status: Abnormal   Collection Time: 08/16/16  9:16 PM  Result Value Ref Range Status   Specimen Description URINE, CLEAN CATCH  Final   Special Requests NONE  Final   Culture MULTIPLE SPECIES PRESENT, SUGGEST RECOLLECTION (A)  Final   Report Status 08/18/2016 FINAL  Final     Radiological Exams on Admission: Dg Chest 2 View  Result Date: 08/20/2016 CLINICAL DATA:  Right-sided chest pain after fall. Increased  weakness and slurring of speech with lethargy. EXAM: CHEST  2 VIEW COMPARISON:  None. FINDINGS: AP semi upright view accentuates the cardiac silhouette and tortuosity of the thoracic aorta. There is aortic atherosclerosis at the arch as before. No pneumonic consolidation or pneumothorax. No effusion. No acute displaced rib fracture is noted. Chronic thoracic spondylosis of the dorsal spine. IMPRESSION: Cardiomegaly with aortic atherosclerosis. No acute pulmonary disease. No acute displaced rib fracture identified. Electronically Signed   By: Ashley Royalty M.D.   On: 08/20/2016 19:05   Ct Head Wo Contrast  Result Date: 08/20/2016 CLINICAL DATA:  Fall and weakness. Recent discharge for stroke. Residual slurred speech. EXAM: CT HEAD WITHOUT CONTRAST TECHNIQUE: Contiguous axial images were obtained from the base of the skull through the vertex without intravenous contrast. COMPARISON:  08/17/2016 MRI FINDINGS: Brain: Mild hypodensities along the left corpus callosum for example on image 18/3 reflect the known subacute infarcts in this vicinity. Small remote infarct in the anterior limb right internal capsule/ adjacent caudate head, image 13/3, chronic. Periventricular white matter and corona radiata hypodensities favor chronic ischemic microvascular white matter disease. Otherwise, the brainstem, cerebellum, cerebral peduncles, thalami, basal ganglia, basilar cisterns, and ventricular system appear within normal limits. No new stroke, mass lesion, or intracranial hemorrhage identified. Vascular: There is atherosclerotic calcification of the cavernous carotid arteries bilaterally. Skull: Osteoma along the outer table of the left frontoparietal skull. This appears stable and has benign imaging characteristics. Sinuses/Orbits: Minimal chronic left maxillary sinusitis. Other: No supplemental non-categorized findings. IMPRESSION: 1. No new/acute intracranial findings are identified. 2. Hypodensities along the left corpus  callosum corresponding to the sites of recent infarct shown on MRI from 08/17/2016. 3. Periventricular white matter and corona radiata hypodensities favor chronic ischemic microvascular white matter disease. 4. 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 5. Minimal chronic left maxillary sinusitis. Electronically Signed   By: Van Clines M.D.   On: 08/20/2016 17:43   Mr Brain Wo Contrast  Result Date: 08/20/2016 CLINICAL DATA:  Recent strokes. [place] yesterday going to the bathroom.  Worsened weakness and balance disturbance. EXAM: MRI HEAD WITHOUT CONTRAST TECHNIQUE: Multiplanar, multiecho pulse sequences of the brain and surrounding structures were obtained without intravenous contrast. COMPARISON:  Head CT same day.  MRI 08/17/2016 FINDINGS: Brain: There is a newly seen small acute infarction in the right lateral thalamus/ posterior limb internal capsule. Previously seen infarctions in the left anterior cerebral artery territory appear the same. These are best compared in the coronal diffusion imaging. Elsewhere, there are old small vessel insults within the deep brain. No large vessel territory infarction elsewhere. No mass lesion, hemorrhage, hydrocephalus or extra-axial collection. Vascular: Major vessels at the base of the brain show flow. Skull and upper cervical spine: Negative Sinuses/Orbits: Clear/normal Other: None significant IMPRESSION: Newly seen small infarction in the right lateral thalamus/ posterior limb internal capsule. No significant change in the previously seen left ACA territory infarctions. Electronically Signed   By: Nelson Chimes M.D.   On: 08/20/2016 20:38    EKG: Independently reviewed. Normal sinus rhythm.  Assessment/Plan Principal Problem:   Acute ischemic stroke [facility]) Active Problems:   Chronic myelogenous leukemia (CML), BCR-ABL1-positive ([place])   Essential hypertension   Stroke (cerebrum) ([place])    1. Acute ischemic stroke - neurologist  has been consulted. Patient failed swallow. Patient is on aspirin. Patient has had stroke workup in March of this year. Further stroke workup per neurologist recommendations. Get speech therapist evaluation and physical therapy. 2. Hypertension - allow for permissive hypertension. Will keep patient on when necessary IV hydralazine for systolic blood pressure more than 979 and diastolic more than 480. 3. CML on Tasigna. 4. Hyperlipidemia on statins.   DVT prophylaxis: Lovenox. Code Status: Full code.  Family Communication: Daughter.  Disposition Plan: To be determined.  Consults called: Neurologist.  Admission status: Observation.    Rise Patience MD [facility] Pager [phone].  If 7PM-7AM, please contact night-coverage [email or web address] Password [facility]  08/20/2016, 11:23 PM

## 2016-08-20 NOTE — ED Triage Notes (Addendum)
Pt brought to ER by daughter. Was recently hospitalized for strokes and discharged on Sunday, had new fall yesterday morning on the way to the bathroom, denies LOC. Patient had residual slurred speech but no unilateral deficits from previous strokes. Daughter notes increasing weakness, unbalanced, lethargy and slurred speech since fall. Does not recall exact time of last seennormal but states when she went to bed last night she was weak but not as weak and had swallowing problems. Niece states at 10am patient had problem swallowing. Per niece at [place] patient did not have problems swallowing and felt her speech was not as slurred as presently.   Spoke to Dr. Tomi Bamberger states to place protocol stroke orders and CT without calling code stroke.

## 2016-08-20 NOTE — ED Notes (Signed)
Admitting at bedside 

## 2016-08-20 NOTE — ED Notes (Signed)
Attempted report x1. 

## 2016-08-20 NOTE — ED Notes (Signed)
Pt returned from MRI and connected to the monitor

## 2016-08-20 NOTE — ED Notes (Signed)
Pt going to CT from waiting room, no stretcher in room at this time. Pt to return to room after CT

## 2016-08-20 NOTE — ED Provider Notes (Signed)
[facility] DEPT Provider Note   CSN: [id number] Arrival date & time: 08/20/16  1619     History   Chief Complaint Chief Complaint  Patient presents with  . Weakness   History is primarily provided by the patient's daughters HPI April Page is a 71 y.o. female.  HPI The patient presents to the emergency room for worsening weakness and falls.  The patient was recently admitted to the hospital on June 8 for a stroke. They noted on June 8 that the patient was having difficulty speaking and swallowing. She was not having any gross motor deficits that were new. She was admitted to the hospital and had an MRI of the brain that showed scattered small acute infarcts in the left hemisphere.  Patient was released from the hospital on June 10.  Patient had a fall yesterday. She is not exactly sure why she fell. Since then the family has noticed increasing difficulty with her ambulation. She seems to hesitate take a long time to step. They've also noticed some worsening issues with her speech. She has difficulty finding certain words.  The patient was not able to do therapy yesterday because of her symptoms. Family is concerned that she may have had a new stroke. Past Medical History:  Diagnosis Date  . Arthritis    stiff knees  . Blood dyscrasia   . Cancer ([place])   . CVA (cerebral vascular accident) ([place])   . GERD (gastroesophageal reflux disease)   . H/O: CML (chronic myeloid leukemia)   . Hyperlipidemia   . Hyperlipidemia   . Hypertension   . Obesity   . Pessary maintenance 07/01/2013    Patient Active Problem List   Diagnosis Date Noted  . Pressure injury of skin 08/17/2016  . Acute CVA (cerebrovascular accident) ([place]) 08/17/2016  . UTI (urinary tract infection) 08/16/2016  . Constipation 07/10/2016  . Dysphagia 07/10/2016  . Loss of weight 07/10/2016  . Impaired glucose tolerance 06/05/2016  . Slurred speech   . CVA (cerebral vascular accident) ([place]) 3/18 06/03/2016  .  Lightheadedness 04/13/2016  . Hypokalemia 04/13/2016  . Hyperglycemia 04/13/2016  . Essential hypertension 04/13/2016  . Hyperlipidemia 04/13/2016  . S/P right hip fracture 04/12/2016  . Hip fracture ([place]) 04/12/2016  . Fall   . Itching in the vaginal area 10/05/2014  . Chronic myelogenous leukemia (CML), BCR-ABL1-positive ([place])   . GERD (gastroesophageal reflux disease) 12/06/2013  . Leukocytosis 08/06/2013  . Atypical chest pain 08/06/2013  . Chronic diarrhea 08/05/2013  . Pessary maintenance 07/01/2013    Past Surgical History:  Procedure Laterality Date  . ABDOMINAL HYSTERECTOMY    . CHOLECYSTECTOMY    . COLONOSCOPY  06/09/2002   KGM:WNUUVOZD hemorrhoids; otherwise normal rectum, colon   . COLONOSCOPY N/A 08/25/2013   GUY:QIHKVQQ diverticulosis. Single colonic polyp-removed  s/p segmental biopsy and stool sample. random colon bx negative. +benign leiomyoma.  . cystocele/rectocele repair  2009  . ESOPHAGOGASTRODUODENOSCOPY  06/09/2002   VZD:GLOVFI upper gastrointestinal tract s/p  54-French Maloney dilator  . FEMUR IM NAIL Right 04/12/2016   Procedure: INTRAMEDULLARY (IM) NAIL FEMORAL;  Surgeon: Nicholes Stairs, MD;  Location: [facility];  Service: Orthopedics;  Laterality: Right;  . HERNIA REPAIR    . OOPHORECTOMY      OB History    Gravida Para Term Preterm AB Living   6 6       5    SAB TAB Ectopic Multiple Live Births  Home Medications    Prior to Admission medications   Medication Sig Start Date End Date Taking? Authorizing Provider  aspirin 325 MG tablet Take 1 tablet (325 mg total) by mouth daily. 06/05/16  Yes Kathie Dike, MD  atorvastatin (LIPITOR) 40 MG tablet Take 1 tablet (40 mg total) by mouth daily at 6 PM. 08/18/16  Yes Samuella Cota, MD  clopidogrel (PLAVIX) 75 MG tablet Take 1 tablet (75 mg total) by mouth daily. 08/18/16  Yes Samuella Cota, MD  losartan (COZAAR) 100 MG tablet Take 1 tablet (100 mg total) by mouth daily.  Resume on 3/28 Patient taking differently: Take 100 mg by mouth daily.  08/18/16  Yes Samuella Cota, MD  Multiple Vitamin (MULTIVITAMIN WITH MINERALS) TABS tablet Take 1 tablet by mouth daily.   Yes [provider]  nilotinib (TASIGNA) 150 MG capsule Take 2 capsules (300 mg total) by mouth every 12 (twelve) hours. 06/19/16  Yes [name], Manon Hilding, PA-C  polyethylene glycol (MIRALAX / GLYCOLAX) packet Take 17 g by mouth daily. 06/06/16  Yes TatShanon Brow, MD    Family History Family History  Problem Relation Age of Onset  . Anuerysm Mother        deceased age 22, brain anuerysm  . Early death Mother 37  . Heart disease Father   . Hypertension Sister   . Obesity Brother   . Hypertension Sister   . Arthritis Sister   . Other Son        cardiac arrest  . Heart disease Child 74       cardiac arrest  . Colon cancer Neg Hx     Social History Social History  Substance Use Topics  . Smoking status: Never Smoker  . Smokeless tobacco: Never Used  . Alcohol use No     Allergies   Patient has no known allergies.   Review of Systems Review of Systems  All other systems reviewed and are negative.    Physical Exam Updated Vital Signs BP (!) 160/84   Pulse 66   Temp 98.2 F (36.8 C) (Oral)   Resp 18   Ht 1.702 m (5\' 7" )   Wt 65.8 kg (145 lb)   SpO2 100%   BMI 22.71 kg/m   Physical Exam  Constitutional: She is oriented to person, place, and time. She appears well-developed and well-nourished. No distress.  HENT:  Head: Normocephalic and atraumatic.  Right Ear: External ear normal.  Left Ear: External ear normal.  Mouth/Throat: Oropharynx is clear and moist.  Eyes: Conjunctivae are normal. Right eye exhibits no discharge. Left eye exhibits no discharge. No scleral icterus.  Neck: Neck supple. No tracheal deviation present.  Cardiovascular: Normal rate, regular rhythm and intact distal pulses.   Pulmonary/Chest: Effort normal and breath sounds normal. No stridor.  No respiratory distress. She has no wheezes. She has no rales.  Contusion right lower ribs posteriorly  Abdominal: Soft. Bowel sounds are normal. She exhibits no distension. There is no tenderness. There is no rebound and no guarding.  Musculoskeletal: She exhibits no edema or tenderness.  Neurological: She is alert and oriented to person, place, and time. No cranial nerve deficit (left  facial droop, extraocular movements intact, speech is slow, difficulty with some word finding) or sensory deficit. She exhibits normal muscle tone. She displays no seizure activity. Coordination normal.  No pronator drift bilateral upper extrem, able to hold both legs off bed for 5 seconds, sensation intact in all extremities, no visual  field cuts, no left or right sided neglect, abnormal finger-nose exam, abnormal heal to shin, no nystagmus noted   Skin: Skin is warm and dry. No rash noted.  Psychiatric: She has a normal mood and affect.  Nursing note and vitals reviewed.    ED Treatments / Results  Labs (all labs ordered are listed, but only abnormal results are displayed) Labs Reviewed  CBC - Abnormal; Notable for the following:       Result Value   RDW 17.0 (*)    All other components within normal limits  COMPREHENSIVE METABOLIC PANEL - Abnormal; Notable for the following:    CO2 21 (*)    Glucose, Bld 127 (*)    BUN 24 (*)    Creatinine, Ser 1.57 (*)    AST 50 (*)    Alkaline Phosphatase 132 (*)    GFR calc non Af Amer 32 (*)    GFR calc Af Amer 37 (*)    All other components within normal limits  CBG MONITORING, ED - Abnormal; Notable for the following:    Glucose-Capillary 131 (*)    All other components within normal limits  I-STAT CHEM 8, ED - Abnormal; Notable for the following:    BUN 33 (*)    Creatinine, Ser 1.50 (*)    Glucose, Bld 132 (*)    Calcium, Ion 1.06 (*)    All other components within normal limits  PROTIME-INR  APTT  DIFFERENTIAL  I-STAT TROPOININ, ED  CBG  MONITORING, ED    EKG  EKG Interpretation  Date/Time:  Tuesday August 20 2016 16:25:29 EDT Ventricular Rate:  77 PR Interval:  146 QRS Duration: 74 QT Interval:  406 QTC Calculation: 459 R Axis:   -10 Text Interpretation:  Normal sinus rhythm Normal ECG Confirmed by Dorie Rank [phone]) on 08/20/2016 9:37:26 PM       Radiology Dg Chest 2 View  Result Date: 08/20/2016 CLINICAL DATA:  Right-sided chest pain after fall. Increased weakness and slurring of speech with lethargy. EXAM: CHEST  2 VIEW COMPARISON:  None. FINDINGS: AP semi upright view accentuates the cardiac silhouette and tortuosity of the thoracic aorta. There is aortic atherosclerosis at the arch as before. No pneumonic consolidation or pneumothorax. No effusion. No acute displaced rib fracture is noted. Chronic thoracic spondylosis of the dorsal spine. IMPRESSION: Cardiomegaly with aortic atherosclerosis. No acute pulmonary disease. No acute displaced rib fracture identified. Electronically Signed   By: Ashley Royalty M.D.   On: 08/20/2016 19:05   Ct Head Wo Contrast  Result Date: 08/20/2016 CLINICAL DATA:  Fall and weakness. Recent discharge for stroke. Residual slurred speech. EXAM: CT HEAD WITHOUT CONTRAST TECHNIQUE: Contiguous axial images were obtained from the base of the skull through the vertex without intravenous contrast. COMPARISON:  08/17/2016 MRI FINDINGS: Brain: Mild hypodensities along the left corpus callosum for example on image 18/3 reflect the known subacute infarcts in this vicinity. Small remote infarct in the anterior limb right internal capsule/ adjacent caudate head, image 13/3, chronic. Periventricular white matter and corona radiata hypodensities favor chronic ischemic microvascular white matter disease. Otherwise, the brainstem, cerebellum, cerebral peduncles, thalami, basal ganglia, basilar cisterns, and ventricular system appear within normal limits. No new stroke, mass lesion, or intracranial hemorrhage  identified. Vascular: There is atherosclerotic calcification of the cavernous carotid arteries bilaterally. Skull: Osteoma along the outer table of the left frontoparietal skull. This appears stable and has benign imaging characteristics. Sinuses/Orbits: Minimal chronic left maxillary sinusitis. Other: No supplemental non-categorized findings.  IMPRESSION: 1. No new/acute intracranial findings are identified. 2. Hypodensities along the left corpus callosum corresponding to the sites of recent infarct shown on MRI from 08/17/2016. 3. Periventricular white matter and corona radiata hypodensities favor chronic ischemic microvascular white matter disease. 4. 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 5. Minimal chronic left maxillary sinusitis. Electronically Signed   By: Van Clines M.D.   On: 08/20/2016 17:43   Mr Brain Wo Contrast  Result Date: 08/20/2016 CLINICAL DATA:  Recent strokes. [place] yesterday going to the bathroom. Worsened weakness and balance disturbance. EXAM: MRI HEAD WITHOUT CONTRAST TECHNIQUE: Multiplanar, multiecho pulse sequences of the brain and surrounding structures were obtained without intravenous contrast. COMPARISON:  Head CT same day.  MRI 08/17/2016 FINDINGS: Brain: There is a newly seen small acute infarction in the right lateral thalamus/ posterior limb internal capsule. Previously seen infarctions in the left anterior cerebral artery territory appear the same. These are best compared in the coronal diffusion imaging. Elsewhere, there are old small vessel insults within the deep brain. No large vessel territory infarction elsewhere. No mass lesion, hemorrhage, hydrocephalus or extra-axial collection. Vascular: Major vessels at the base of the brain show flow. Skull and upper cervical spine: Negative Sinuses/Orbits: Clear/normal Other: None significant IMPRESSION: Newly seen small infarction in the right lateral thalamus/ posterior limb internal capsule.  No significant change in the previously seen left ACA territory infarctions. Electronically Signed   By: Nelson Chimes M.D.   On: 08/20/2016 20:38    Procedures Procedures (including critical care time)  Medications Ordered in ED Medications - No data to display   Initial Impression / Assessment and Plan / ED Course  I have reviewed the triage vital signs and the nursing notes.  Pertinent labs & imaging results that were available during my care of the patient were reviewed by me and considered in my medical decision making (see chart for details).   patient presents to the emergency room for evaluation of recurrent stroke symptoms. Patient was recently discharged from the hospital after being diagnosed with a stroke. She started having worsening ataxia. Is also having some speech difficulties. MRI today does show a new infarction in the right lateral thalamus and posterior limb of the internal capsule.  I spoke with Dr. Nicole Kindred, neurology. He recommends admission for further evaluation. I spoke with Dr. Hal Hope, hospitalist service regarding admission.  Final Clinical Impressions(s) / ED Diagnoses   Final diagnoses:  Cerebrovascular accident (CVA), unspecified mechanism ([place])    New Prescriptions New Prescriptions   No medications on file     Dorie Rank, MD 08/20/16 2138

## 2016-08-21 ENCOUNTER — Other Ambulatory Visit: Payer: Self-pay

## 2016-08-21 ENCOUNTER — Ambulatory Visit (HOSPITAL_COMMUNITY): Payer: Medicare HMO | Admitting: Physical Therapy

## 2016-08-21 ENCOUNTER — Telehealth (HOSPITAL_COMMUNITY): Payer: Self-pay | Admitting: Speech Pathology

## 2016-08-21 ENCOUNTER — Observation Stay (HOSPITAL_COMMUNITY): Payer: Medicare HMO

## 2016-08-21 ENCOUNTER — Encounter (HOSPITAL_COMMUNITY): Payer: Self-pay | Admitting: Radiology

## 2016-08-21 ENCOUNTER — Telehealth: Payer: Self-pay

## 2016-08-21 DIAGNOSIS — E119 Type 2 diabetes mellitus without complications: Secondary | ICD-10-CM | POA: Diagnosis not present

## 2016-08-21 DIAGNOSIS — I69391 Dysphagia following cerebral infarction: Secondary | ICD-10-CM | POA: Diagnosis not present

## 2016-08-21 DIAGNOSIS — N179 Acute kidney failure, unspecified: Secondary | ICD-10-CM | POA: Diagnosis present

## 2016-08-21 DIAGNOSIS — R1311 Dysphagia, oral phase: Secondary | ICD-10-CM | POA: Diagnosis present

## 2016-08-21 DIAGNOSIS — R5381 Other malaise: Secondary | ICD-10-CM | POA: Diagnosis not present

## 2016-08-21 DIAGNOSIS — E1159 Type 2 diabetes mellitus with other circulatory complications: Secondary | ICD-10-CM | POA: Diagnosis not present

## 2016-08-21 DIAGNOSIS — I10 Essential (primary) hypertension: Secondary | ICD-10-CM | POA: Diagnosis present

## 2016-08-21 DIAGNOSIS — E1151 Type 2 diabetes mellitus with diabetic peripheral angiopathy without gangrene: Secondary | ICD-10-CM | POA: Diagnosis present

## 2016-08-21 DIAGNOSIS — R4182 Altered mental status, unspecified: Secondary | ICD-10-CM | POA: Diagnosis not present

## 2016-08-21 DIAGNOSIS — I639 Cerebral infarction, unspecified: Secondary | ICD-10-CM

## 2016-08-21 DIAGNOSIS — K219 Gastro-esophageal reflux disease without esophagitis: Secondary | ICD-10-CM | POA: Diagnosis present

## 2016-08-21 DIAGNOSIS — M1990 Unspecified osteoarthritis, unspecified site: Secondary | ICD-10-CM | POA: Diagnosis present

## 2016-08-21 DIAGNOSIS — Q211 Atrial septal defect: Secondary | ICD-10-CM | POA: Diagnosis not present

## 2016-08-21 DIAGNOSIS — I638 Other cerebral infarction: Secondary | ICD-10-CM | POA: Diagnosis present

## 2016-08-21 DIAGNOSIS — Z7982 Long term (current) use of aspirin: Secondary | ICD-10-CM | POA: Diagnosis not present

## 2016-08-21 DIAGNOSIS — Z8673 Personal history of transient ischemic attack (TIA), and cerebral infarction without residual deficits: Secondary | ICD-10-CM | POA: Diagnosis not present

## 2016-08-21 DIAGNOSIS — E049 Nontoxic goiter, unspecified: Secondary | ICD-10-CM | POA: Diagnosis present

## 2016-08-21 DIAGNOSIS — Z7902 Long term (current) use of antithrombotics/antiplatelets: Secondary | ICD-10-CM | POA: Diagnosis not present

## 2016-08-21 DIAGNOSIS — R29703 NIHSS score 3: Secondary | ICD-10-CM | POA: Diagnosis present

## 2016-08-21 DIAGNOSIS — D759 Disease of blood and blood-forming organs, unspecified: Secondary | ICD-10-CM | POA: Diagnosis present

## 2016-08-21 DIAGNOSIS — R4781 Slurred speech: Secondary | ICD-10-CM | POA: Diagnosis present

## 2016-08-21 DIAGNOSIS — C9210 Chronic myeloid leukemia, BCR/ABL-positive, not having achieved remission: Secondary | ICD-10-CM | POA: Diagnosis present

## 2016-08-21 DIAGNOSIS — R531 Weakness: Secondary | ICD-10-CM | POA: Diagnosis not present

## 2016-08-21 DIAGNOSIS — E785 Hyperlipidemia, unspecified: Secondary | ICD-10-CM | POA: Diagnosis present

## 2016-08-21 DIAGNOSIS — Z79899 Other long term (current) drug therapy: Secondary | ICD-10-CM | POA: Diagnosis not present

## 2016-08-21 DIAGNOSIS — I69891 Dysphagia following other cerebrovascular disease: Secondary | ICD-10-CM | POA: Diagnosis not present

## 2016-08-21 LAB — COMPREHENSIVE METABOLIC PANEL
ALBUMIN: 3.4 g/dL — AB (ref 3.5–5.0)
ALT: 37 U/L (ref 14–54)
AST: 39 U/L (ref 15–41)
Alkaline Phosphatase: 116 U/L (ref 38–126)
Anion gap: 11 (ref 5–15)
BUN: 18 mg/dL (ref 6–20)
CHLORIDE: 107 mmol/L (ref 101–111)
CO2: 23 mmol/L (ref 22–32)
CREATININE: 1.2 mg/dL — AB (ref 0.44–1.00)
Calcium: 9.3 mg/dL (ref 8.9–10.3)
GFR calc Af Amer: 51 mL/min — ABNORMAL LOW (ref >60)
GFR, EST NON AFRICAN AMERICAN: 44 mL/min — AB (ref >60)
GLUCOSE: 100 mg/dL — AB (ref 65–99)
Potassium: 3.7 mmol/L (ref 3.5–5.1)
Sodium: 141 mmol/L (ref 135–145)
Total Bilirubin: 1 mg/dL (ref 0.3–1.2)
Total Protein: 6.6 g/dL (ref 6.5–8.1)

## 2016-08-21 LAB — CBC
HEMATOCRIT: 35.1 % — AB (ref 36.0–46.0)
Hemoglobin: 11.1 g/dL — ABNORMAL LOW (ref 12.0–15.0)
MCH: 26.8 pg (ref 26.0–34.0)
MCHC: 31.6 g/dL (ref 30.0–36.0)
MCV: 84.8 fL (ref 78.0–100.0)
PLATELETS: 251 10*3/uL (ref 150–400)
RBC: 4.14 MIL/uL (ref 3.87–5.11)
RDW: 17 % — AB (ref 11.5–15.5)
WBC: 7.5 10*3/uL (ref 4.0–10.5)

## 2016-08-21 MED ORDER — ATORVASTATIN CALCIUM 40 MG PO TABS
40.0000 mg | ORAL_TABLET | Freq: Every day | ORAL | Status: DC
  Administered 2016-08-22 – 2016-08-24 (×2): 40 mg via ORAL
  Filled 2016-08-21 (×2): qty 1

## 2016-08-21 MED ORDER — ENSURE ENLIVE PO LIQD: 237.0000 mL | Freq: Two times a day (BID) | ORAL | Status: DC

## 2016-08-21 MED ORDER — HYDRALAZINE HCL 20 MG/ML IJ SOLN: 10.0000 mg | INTRAMUSCULAR | Status: DC | PRN

## 2016-08-21 MED ORDER — PROMETHAZINE HCL 25 MG/ML IJ SOLN: 12.5000 mg | Freq: Once | INTRAMUSCULAR | Status: DC

## 2016-08-21 MED ORDER — ONDANSETRON HCL 4 MG/2ML IJ SOLN: 4.0000 mg | Freq: Four times a day (QID) | INTRAMUSCULAR | Status: DC | PRN

## 2016-08-21 MED ORDER — IOPAMIDOL (ISOVUE-370) INJECTION 76%
INTRAVENOUS | Status: AC
  Administered 2016-08-21: 50 mL
  Filled 2016-08-21: qty 50

## 2016-08-21 NOTE — Consult Note (Signed)
   [facility] CM Inpatient Consult   08/21/2016  April Page 04-15-1945 [id number]   Patient evaluated for re-admission and chart review reveals the patient is admitted with new stroke (Acute Ischemic).  Chart review is recommending a skilled nursing facility for disposition at this time.  Patient had previously been with outpatient rehab. No community [facility] Care Management needs noted at this time.  For questions, please contact:  Natividad Brood, RN BSN [facility] Liaison  [phone] business mobile phone Toll free office [phone]

## 2016-08-21 NOTE — Evaluation (Signed)
Speech Language Pathology Evaluation Patient Details Name: April Page MRN: [id number] DOB: 1945/11/08 Today's Date: 08/21/2016 Time: 1006-1020 SLP Time Calculation (min) (ACUTE ONLY): 14 min  Problem List:  Patient Active Problem List   Diagnosis Date Noted  . Acute ischemic stroke ([place]) 08/20/2016  . Stroke (cerebrum) ([place]) 08/20/2016  . Pressure injury of skin 08/17/2016  . Acute CVA (cerebrovascular accident) ([place]) 08/17/2016  . UTI (urinary tract infection) 08/16/2016  . Constipation 07/10/2016  . Dysphagia 07/10/2016  . Loss of weight 07/10/2016  . Impaired glucose tolerance 06/05/2016  . Slurred speech   . CVA (cerebral vascular accident) ([place]) 3/18 06/03/2016  . Lightheadedness 04/13/2016  . Hypokalemia 04/13/2016  . Hyperglycemia 04/13/2016  . Essential hypertension 04/13/2016  . Hyperlipidemia 04/13/2016  . S/P right hip fracture 04/12/2016  . Hip fracture ([place]) 04/12/2016  . Fall   . Itching in the vaginal area 10/05/2014  . Chronic myelogenous leukemia (CML), BCR-ABL1-positive ([place])   . GERD (gastroesophageal reflux disease) 12/06/2013  . Leukocytosis 08/06/2013  . Atypical chest pain 08/06/2013  . Chronic diarrhea 08/05/2013  . Pessary maintenance 07/01/2013   Past Medical History:  Past Medical History:  Diagnosis Date  . Arthritis    stiff knees  . Blood dyscrasia   . Cancer ([place])   . CVA (cerebral vascular accident) ([place])   . GERD (gastroesophageal reflux disease)   . H/O: CML (chronic myeloid leukemia)   . Hyperlipidemia   . Hyperlipidemia   . Hypertension   . Obesity   . Pessary maintenance 07/01/2013   Past Surgical History:  Past Surgical History:  Procedure Laterality Date  . ABDOMINAL HYSTERECTOMY    . CHOLECYSTECTOMY    . COLONOSCOPY  06/09/2002   WJX:[id number] hemorrhoids; otherwise normal rectum, colon   . COLONOSCOPY N/A 08/25/2013   AOZ:HYQMVHQ diverticulosis. Single colonic polyp-removed  s/p segmental biopsy and stool sample.  random colon bx negative. +benign leiomyoma.  . cystocele/rectocele repair  2009  . ESOPHAGOGASTRODUODENOSCOPY  06/09/2002   ION:GEXBMW upper gastrointestinal tract s/p  54-French Maloney dilator  . FEMUR IM NAIL Right 04/12/2016   Procedure: INTRAMEDULLARY (IM) NAIL FEMORAL;  Surgeon: Nicholes Stairs, MD;  Location: [facility];  Service: Orthopedics;  Laterality: Right;  . HERNIA REPAIR    . OOPHORECTOMY     HPI:  April Page a 71 y.o.femalewith history of stroke who was recently discharged on June 10 after being admitted for stroke was brought to the ER after patient's daughter found that patient was having increasing difficulty walking with increasing difficulty swallowing and also slurred speech. MRI of the brain shows new thalamic infarct extending to the posterior limb of the internal capsule. CXR Cardiomegaly with aortic atherosclerosis. No acute pulmonary disease. No acute displaced rib fracture identified. SLE at [facility] revealed a gradual decline since hip fracture in February and pt is not quite back to baseline.    Assessment / Plan / Recommendation Clinical Impression  Pt with baseline cognitive deficits per daughter but she reports increased difficulty with responding to questions, flat affect since this CVA. Speech intelligibility decreased due to low vocal intensity. Pt would benefit from continued ST.     SLP Assessment  SLP Recommendation/Assessment: Patient needs continued Speech Lanaguage Pathology Services SLP Visit Diagnosis: Cognitive communication deficit (R41.841)    Follow Up Recommendations   (TBD)    Frequency and Duration min 2x/week  2 weeks      SLP Evaluation Cognition  Overall Cognitive Status: Impaired/Different from baseline Arousal/Alertness: Awake/alert  Orientation Level: Oriented to person;Disoriented to time;Oriented to place Attention: Sustained Sustained Attention: Impaired Sustained Attention Impairment: Verbal basic Memory:  Impaired Memory Impairment: Storage deficit;Retrieval deficit Awareness: Impaired Awareness Impairment: Intellectual impairment;Emergent impairment;Anticipatory impairment Problem Solving:  (to assess further) Executive Function: Initiating Initiating: Impaired Initiating Impairment: Verbal basic;Functional basic Behaviors:  (flat affect) Safety/Judgment: Impaired Comments: reduced insight into deficits       Comprehension  Auditory Comprehension Overall Auditory Comprehension: Appears within functional limits for tasks assessed Interfering Components: Attention Visual Recognition/Discrimination Discrimination: Not tested Reading Comprehension Reading Status: Not tested    Expression Expression Primary Mode of Expression: Verbal Verbal Expression Overall Verbal Expression: Impaired Initiation: Impaired Repetition:  (NT) Naming: Impairment Responsive: Not tested Confrontation: Within functional limits Convergent: Not tested Divergent:  (named 4 animals) Pragmatics: Impairment Impairments: Abnormal affect;Eye contact Written Expression Dominant Hand: Right Written Expression: Not tested   Oral / Motor  Oral Motor/Sensory Function Overall Oral Motor/Sensory Function: Mild impairment Facial ROM: Reduced right Facial Symmetry: Abnormal symmetry right Facial Strength: Reduced right Facial Sensation: Reduced right Lingual ROM: Reduced right Lingual Symmetry: Abnormal symmetry left Lingual Strength: Within Functional Limits Motor Speech Overall Motor Speech: Impaired Respiration: Within functional limits Phonation: Low vocal intensity Resonance: Within functional limits Articulation: Within functional limitis Intelligibility: Intelligibility reduced Word: 75-100% accurate Phrase: 75-100% accurate Sentence: 75-100% accurate Motor Planning: Witnin functional limits Effective Techniques: Increased vocal intensity   GO          Functional Assessment Tool Used:  skilled clinical observation Functional Limitations: Memory Swallow Current Status (U3845): At least 40 percent but less than 60 percent impaired, limited or restricted Swallow Goal Status [phone]): At least 20 percent but less than 40 percent impaired, limited or restricted Memory Current Status (E3212): At least 40 percent but less than 60 percent impaired, limited or restricted Memory Goal Status (Y4825): At least 20 percent but less than 40 percent impaired, limited or restricted         Houston Siren 08/21/2016, 11:07 AM  Orbie Pyo Colvin Caroli.Ed [employer] [phone]

## 2016-08-21 NOTE — Telephone Encounter (Signed)
[name] called her mother has had another stroke and is in [place] will be in touch with [place] once she knows more about the future. NF

## 2016-08-21 NOTE — Progress Notes (Addendum)
[facility] PROGRESS NOTE  April Page WRU:[id number] DOB: 1945-07-07 DOA: 08/20/2016 PCP: Redmond School, MD  Interim summary and HPI 71 y.o. female with history of stroke who was recently discharged on June 10 after being admitted for stroke was brought to the ER after patient's daughter found that patient was having increasing difficulty walking with increasing difficulty swallowing and also slurred speech. Found to have new stroke affecting her internal capsule and right thalamic region.  Assessment/Plan: 1-acute small right lateral thalamic and internal capsule infarcts -With concerns for embolic source -Patient is struggling with swallowing currently; so for secondary prevention she will be kept on aspirin per rectum -After discussing with neurology plan is for TEE on 6/14; basal results patient might end up requiring lower extremity Doppler that his a positive PFO or insertion of a loop recorder -LDL 222 -A1c 6.4 -Physical therapy/occupational therapy recommending SNF at this moment. -Will follow clinical response -?? Stopping nilotinib  -patient was on aspirin and plavix prior to admission   2-hypertension -Stable overall -Will be permissive with elevated blood pressure in the setting of acute CVA  3-hyperlipidemia -Resume statins  4-diabetes mellitus type 2 -was on diet control only -A1C 6.4 -will monitor CBG's  5-AKI -holding nephrotoxic agents and providing IVF's -will follow renal function trend   6-CML -continue outpatient follow up with oncology -not in remission -was on nilotinib   Code Status: Full code Family Communication: Daughter at bedside Disposition Plan: Patient may need skilled nursing facility when medically stable; will complete a stroke workup and follow neurology recommendations.   Consultants:  Neurology  Cardiology   Procedures:  See below for x-ray reports   CT head and NecK: without significant stenosis  TEE: planned  for 6/14  Antibiotics:  None   HPI/Subjective: Afebrile, denies chest pain, abdominal pain and shortness of breath. Patient with episode of nausea/vomiting. Slower response and slurred speech on exam. 3/5 MS bilaterally.  Objective: Vitals:   08/21/16 1200 08/21/16 1659  BP: (!) 184/90 (!) 158/88  Pulse: 68 88  Resp: 16 12  Temp: 98.2 F (36.8 C) 98.5 F (36.9 C)    Intake/Output Summary (Last 24 hours) at 08/21/16 1910 Last data filed at 08/21/16 1500  Gross per 24 hour  Intake             1080 ml  Output                0 ml  Net             1080 ml   Filed Weights   08/20/16 1634 08/21/16 0000  Weight: 65.8 kg (145 lb) 64 kg (141 lb 1.6 oz)    Exam:   General: Afebrile, able to follow minimal commands; one episode of nausea/vomiting. No chest pain, no abdominal pain, no shortness of breath. Patient weak/lethargic, with slurred speech.  Cardiovascular: S1 and S2, no rubs, no gallops, no JVD  Respiratory: Good air movement, no wheezing, no crackles, no using accessory muscles  Abdomen: Soft, nontender, nondistended, positive bowel sounds  Musculoskeletal: No edema, no cyanosis, no clubbing  Neurologic exam: Able to follow simple commands; patient with slower speech and intermittent episode of aphasia, cranial nerve grossly intact; plantar reflexes flexor bilaterally; more strength 3-4 out of 5 bilaterally with normal tone and bulk  Data Reviewed: Basic Metabolic Panel:  Recent Labs Lab 08/16/16 1713 08/20/16 1634 08/20/16 1643 08/20/16 2248 08/21/16 0448  NA 140 141 141  --  141  K 3.6  4.2 4.2  --  3.7  CL 107 110 110  --  107  CO2 25 21*  --   --  23  GLUCOSE 119* 127* 132*  --  100*  BUN 13 24* 33*  --  18  CREATININE 1.02* 1.57* 1.50* 1.47* 1.20*  CALCIUM 9.6 9.6  --   --  9.3   Liver Function Tests:  Recent Labs Lab 08/20/16 1634 08/21/16 0448  AST 50* 39  ALT 37 37  ALKPHOS 132* 116  BILITOT 1.2 1.0  PROT 7.3 6.6  ALBUMIN 3.8 3.4*    CBC:  Recent Labs Lab 08/16/16 1713 08/20/16 1634 08/20/16 1643 08/20/16 2248 08/21/16 0448  WBC 6.7 7.7  --  8.4 7.5  NEUTROABS 3.7 4.4  --   --   --   HGB 11.4* 12.2 12.6 11.7* 11.1*  HCT 35.7* 37.8 37.0 36.8 35.1*  MCV 83.8 84.6  --  84.4 84.8  PLT 272 264  --  262 251   CBG:  Recent Labs Lab 08/17/16 1621 08/17/16 2151 08/18/16 0750 08/18/16 1140 08/20/16 1633  GLUCAP 148* 112* 110* 129* 131*    Recent Results (from the past 240 hour(s))  Urine culture     Status: Abnormal   Collection Time: 08/16/16  9:16 PM  Result Value Ref Range Status   Specimen Description URINE, CLEAN CATCH  Final   Special Requests NONE  Final   Culture MULTIPLE SPECIES PRESENT, SUGGEST RECOLLECTION (A)  Final   Report Status 08/18/2016 FINAL  Final     Studies: Ct Angio Head W Or Wo Contrast  Result Date: 08/21/2016 CLINICAL DATA:  Acute multifocal infarcts. Worsening of weakness and ambulatory difficulty. EXAM: CT ANGIOGRAPHY HEAD AND NECK TECHNIQUE: Multidetector CT imaging of the head and neck was performed using the standard protocol during bolus administration of intravenous contrast. Multiplanar CT image reconstructions and MIPs were obtained to evaluate the vascular anatomy. Carotid stenosis measurements (when applicable) are obtained utilizing NASCET criteria, using the distal internal carotid diameter as the denominator. CONTRAST:  Isovue 370, 50 mL COMPARISON:  Multiple prior MR scans, most recent 08/20/2016. FINDINGS: CTA NECK Aortic arch: Conventional branching. Significant mass effect due to a large substernal goiter, with displacement of the trachea to the RIGHT, and the arch anteriorly and to the LEFT. Imaged portion shows no evidence of aneurysm or dissection. No significant stenosis of the major arch vessel origins. Right carotid system: Minor atheromatous change. No evidence of dissection, stenosis (50% or greater) or occlusion. Left carotid system: Minor atheromatous  change. No measurable stenosis. No evidence of dissection, or occlusion. Vertebral arteries: BILATERAL patent, LEFT dominant. Both vertebral arteries are stretched. No evidence of dissection, stenosis (50% or greater) or occlusion. Nonvascular soft tissues: Large thyroid goiter, approximately 6 x 6 x 9 cm, with significant mass effect on the superior mediastinal and lower LEFT neck structures. No lung apex lesion. Spondylosis. CTA HEAD Anterior circulation: Mild calcific atheromatous change of the skull-base cavernous internal carotid arteries. Hypoplastic/atretic RIGHT anterior cerebral artery A1 segment. Both anterior cerebral arteries are fed the LEFT. No MCA M1 or M2 stenosis of significance. No proximal occlusion, aneurysm, or vascular malformation. Posterior circulation: Basilar artery widely patent with LEFT vertebral dominant. Atheromatous change in the BILATERAL V4 segments without significant stenosis. 50% stenosis RIGHT P1 PCA. Occluded LEFT PCA in its distal P1 segment. There is slight reconstitution distally. This correlates with the observed pattern of pericallosal infarction involving the splenium. No aneurysm, or vascular malformation. Venous  sinuses: As permitted by contrast timing, patent. Anatomic variants: None of significance. Delayed phase: No abnormal intracranial enhancement. Acute, subacute, and chronic infarctions as previously noted. Review of the MIP images confirms the above findings. IMPRESSION: No extracranial stenosis of significance. Minor atheromatous change in both carotid bifurcations. Hypoplastic/atretic RIGHT A1 anterior cerebral. Dominant/sole contribution from the LEFT A1 ACA which supplies both anterior cerebral is distally. Occluded LEFT posterior cerebral artery in its distal P1 segment, as predicted from prior MRA. 50% stenosis RIGHT P1 PCA. Large thyroid goiter with substernal extension, approximate 6 x 6 x 9 cm. There is mass effect of both the trachea and the aortic  arch. Surgical consultation is warranted. Electronically Signed   By: Staci Righter M.D.   On: 08/21/2016 13:26   Dg Chest 2 View  Result Date: 08/20/2016 CLINICAL DATA:  Right-sided chest pain after fall. Increased weakness and slurring of speech with lethargy. EXAM: CHEST  2 VIEW COMPARISON:  None. FINDINGS: AP semi upright view accentuates the cardiac silhouette and tortuosity of the thoracic aorta. There is aortic atherosclerosis at the arch as before. No pneumonic consolidation or pneumothorax. No effusion. No acute displaced rib fracture is noted. Chronic thoracic spondylosis of the dorsal spine. IMPRESSION: Cardiomegaly with aortic atherosclerosis. No acute pulmonary disease. No acute displaced rib fracture identified. Electronically Signed   By: Ashley Royalty M.D.   On: 08/20/2016 19:05   Ct Head Wo Contrast  Result Date: 08/21/2016 CLINICAL DATA:  71 year old female with recent left ACA and right PCA territory infarcts. Worsening mental status. EXAM: CT HEAD WITHOUT CONTRAST TECHNIQUE: Contiguous axial images were obtained from the base of the skull through the vertex without intravenous contrast. COMPARISON:  CTA head and neck 1238 hours today, brain MRI and noncontrast head CT 08/20/2016 and earlier. FINDINGS: Brain: Distal left ACA territory infarct remains subtle by CT. The more recent right lateral thalamus/ right internal capsule lacune is also poorly visible by CT. No acute intracranial hemorrhage identified. No midline shift, mass effect, or evidence of intracranial mass lesion. Chronic right basal ganglia lacunar infarct. Stable gray-white matter differentiation since the CT yesterday. No ventriculomegaly. Vascular: Calcified atherosclerosis at the skull base. Skull: No acute osseous abnormality identified. Sinuses/Orbits: Visualized paranasal sinuses and mastoids are stable and well pneumatized. Other: No acute orbit or scalp soft tissue findings. Trace retained secretions in the pharynx.  IMPRESSION: 1. No new acute intracranial abnormality. Stable non contrast CT appearance of the brain since yesterday. 2. Left ACA and right thalamic infarcts remain poorly visible by CT. No associated hemorrhage or mass effect. Electronically Signed   By: Genevie Ann M.D.   On: 08/21/2016 18:44   Ct Head Wo Contrast  Result Date: 08/20/2016 CLINICAL DATA:  Fall and weakness. Recent discharge for stroke. Residual slurred speech. EXAM: CT HEAD WITHOUT CONTRAST TECHNIQUE: Contiguous axial images were obtained from the base of the skull through the vertex without intravenous contrast. COMPARISON:  08/17/2016 MRI FINDINGS: Brain: Mild hypodensities along the left corpus callosum for example on image 18/3 reflect the known subacute infarcts in this vicinity. Small remote infarct in the anterior limb right internal capsule/ adjacent caudate head, image 13/3, chronic. Periventricular white matter and corona radiata hypodensities favor chronic ischemic microvascular white matter disease. Otherwise, the brainstem, cerebellum, cerebral peduncles, thalami, basal ganglia, basilar cisterns, and ventricular system appear within normal limits. No new stroke, mass lesion, or intracranial hemorrhage identified. Vascular: There is atherosclerotic calcification of the cavernous carotid arteries bilaterally. Skull: Osteoma along the outer  table of the left frontoparietal skull. This appears stable and has benign imaging characteristics. Sinuses/Orbits: Minimal chronic left maxillary sinusitis. Other: No supplemental non-categorized findings. IMPRESSION: 1. No new/acute intracranial findings are identified. 2. Hypodensities along the left corpus callosum corresponding to the sites of recent infarct shown on MRI from 08/17/2016. 3. Periventricular white matter and corona radiata hypodensities favor chronic ischemic microvascular white matter disease. 4. 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 5. Minimal  chronic left maxillary sinusitis. Electronically Signed   By: Van Clines M.D.   On: 08/20/2016 17:43   Ct Angio Neck W Or Wo Contrast  Result Date: 08/21/2016 CLINICAL DATA:  Acute multifocal infarcts. Worsening of weakness and ambulatory difficulty. EXAM: CT ANGIOGRAPHY HEAD AND NECK TECHNIQUE: Multidetector CT imaging of the head and neck was performed using the standard protocol during bolus administration of intravenous contrast. Multiplanar CT image reconstructions and MIPs were obtained to evaluate the vascular anatomy. Carotid stenosis measurements (when applicable) are obtained utilizing NASCET criteria, using the distal internal carotid diameter as the denominator. CONTRAST:  Isovue 370, 50 mL COMPARISON:  Multiple prior MR scans, most recent 08/20/2016. FINDINGS: CTA NECK Aortic arch: Conventional branching. Significant mass effect due to a large substernal goiter, with displacement of the trachea to the RIGHT, and the arch anteriorly and to the LEFT. Imaged portion shows no evidence of aneurysm or dissection. No significant stenosis of the major arch vessel origins. Right carotid system: Minor atheromatous change. No evidence of dissection, stenosis (50% or greater) or occlusion. Left carotid system: Minor atheromatous change. No measurable stenosis. No evidence of dissection, or occlusion. Vertebral arteries: BILATERAL patent, LEFT dominant. Both vertebral arteries are stretched. No evidence of dissection, stenosis (50% or greater) or occlusion. Nonvascular soft tissues: Large thyroid goiter, approximately 6 x 6 x 9 cm, with significant mass effect on the superior mediastinal and lower LEFT neck structures. No lung apex lesion. Spondylosis. CTA HEAD Anterior circulation: Mild calcific atheromatous change of the skull-base cavernous internal carotid arteries. Hypoplastic/atretic RIGHT anterior cerebral artery A1 segment. Both anterior cerebral arteries are fed the LEFT. No MCA M1 or M2  stenosis of significance. No proximal occlusion, aneurysm, or vascular malformation. Posterior circulation: Basilar artery widely patent with LEFT vertebral dominant. Atheromatous change in the BILATERAL V4 segments without significant stenosis. 50% stenosis RIGHT P1 PCA. Occluded LEFT PCA in its distal P1 segment. There is slight reconstitution distally. This correlates with the observed pattern of pericallosal infarction involving the splenium. No aneurysm, or vascular malformation. Venous sinuses: As permitted by contrast timing, patent. Anatomic variants: None of significance. Delayed phase: No abnormal intracranial enhancement. Acute, subacute, and chronic infarctions as previously noted. Review of the MIP images confirms the above findings. IMPRESSION: No extracranial stenosis of significance. Minor atheromatous change in both carotid bifurcations. Hypoplastic/atretic RIGHT A1 anterior cerebral. Dominant/sole contribution from the LEFT A1 ACA which supplies both anterior cerebral is distally. Occluded LEFT posterior cerebral artery in its distal P1 segment, as predicted from prior MRA. 50% stenosis RIGHT P1 PCA. Large thyroid goiter with substernal extension, approximate 6 x 6 x 9 cm. There is mass effect of both the trachea and the aortic arch. Surgical consultation is warranted. Electronically Signed   By: Staci Righter M.D.   On: 08/21/2016 13:26   Mr Brain Wo Contrast  Result Date: 08/20/2016 CLINICAL DATA:  Recent strokes. [place] yesterday going to the bathroom. Worsened weakness and balance disturbance. EXAM: MRI HEAD WITHOUT CONTRAST TECHNIQUE: Multiplanar, multiecho pulse sequences of  the brain and surrounding structures were obtained without intravenous contrast. COMPARISON:  Head CT same day.  MRI 08/17/2016 FINDINGS: Brain: There is a newly seen small acute infarction in the right lateral thalamus/ posterior limb internal capsule. Previously seen infarctions in the left anterior cerebral artery  territory appear the same. These are best compared in the coronal diffusion imaging. Elsewhere, there are old small vessel insults within the deep brain. No large vessel territory infarction elsewhere. No mass lesion, hemorrhage, hydrocephalus or extra-axial collection. Vascular: Major vessels at the base of the brain show flow. Skull and upper cervical spine: Negative Sinuses/Orbits: Clear/normal Other: None significant IMPRESSION: Newly seen small infarction in the right lateral thalamus/ posterior limb internal capsule. No significant change in the previously seen left ACA territory infarctions. Electronically Signed   By: Nelson Chimes M.D.   On: 08/20/2016 20:38    Scheduled Meds: . aspirin  300 mg Rectal Daily   Or  . aspirin  325 mg Oral Daily  . [START ON 08/22/2016] atorvastatin  40 mg Oral q1800  . enoxaparin (LOVENOX) injection  40 mg Subcutaneous QHS  . feeding supplement (ENSURE ENLIVE)  237 mL Oral BID BM  . promethazine  12.5 mg Intravenous Once   Continuous Infusions: . sodium chloride 75 mL/hr at 08/21/16 0124    Principal Problem:   Acute ischemic stroke [facility]) Active Problems:   Chronic myelogenous leukemia (CML), BCR-ABL1-positive ([place])   Essential hypertension   Stroke (cerebrum) ([place])    Time spent: 35 minutes    Barton Dubois  [facility] Pager [phone]. If 7PM-7AM, please contact night-coverage at [email or web address], password [facility] 08/21/2016, 7:10 PM  LOS: 0 days

## 2016-08-21 NOTE — Progress Notes (Signed)
STAT head CT obtained for new onset drowsiness preceded with vominting is negative for acute changes. Will order phenergan IV and monitor for improvement.   Electronically signed: Dr. Kerney Elbe

## 2016-08-21 NOTE — Consult Note (Signed)
Admission H&P    Chief Complaint: Increasing weakness and difficulty with ambulation.  HPI: April Page is an 71 y.o. female with a history of hypertension, hyperlipidemia, previous cerebral infarctions, CML and arthritis presenting with worsening of weakness and ambulatory difficulty since discharge from the hospital on 08/18/2016 following a recurrent stroke. Patient has been on aspirin and Plavix daily. She was ambulating independently at the time of discharge and for 1 day after discharge to home. She was noted on 08/20/2016 to be markedly weaker and fell when she attempted to walk. Speech wasat least more slurred as well. MRI of the brain showed an acute right thalamic and internal capsular ischemic infarction, not seen on studies prior to today.  LSN: 08/19/2016 tPA Given: No: Recent recurrent stroke within the last 1 week mRankin:  Past Medical History:  Diagnosis Date  . Arthritis    stiff knees  . Blood dyscrasia   . Cancer ([place])   . CVA (cerebral vascular accident) ([place])   . GERD (gastroesophageal reflux disease)   . H/O: CML (chronic myeloid leukemia)   . Hyperlipidemia   . Hyperlipidemia   . Hypertension   . Obesity   . Pessary maintenance 07/01/2013    Past Surgical History:  Procedure Laterality Date  . ABDOMINAL HYSTERECTOMY    . CHOLECYSTECTOMY    . COLONOSCOPY  06/09/2002   CXK:GYJEHUDJ hemorrhoids; otherwise normal rectum, colon   . COLONOSCOPY N/A 08/25/2013   SHF:WYOVZCH diverticulosis. Single colonic polyp-removed  s/p segmental biopsy and stool sample. random colon bx negative. +benign leiomyoma.  . cystocele/rectocele repair  2009  . ESOPHAGOGASTRODUODENOSCOPY  06/09/2002   YIF:OYDXAJ upper gastrointestinal tract s/p  54-French Maloney dilator  . FEMUR IM NAIL Right 04/12/2016   Procedure: INTRAMEDULLARY (IM) NAIL FEMORAL;  Surgeon: Nicholes Stairs, MD;  Location: [place] [facility];  Service: Orthopedics;  Laterality: Right;  . HERNIA REPAIR    . OOPHORECTOMY       Family History  Problem Relation Age of Onset  . Anuerysm Mother        deceased age 8, brain anuerysm  . Early death Mother 41  . Heart disease Father   . Hypertension Sister   . Obesity Brother   . Hypertension Sister   . Arthritis Sister   . Other Son        cardiac arrest  . Heart disease Child 89       cardiac arrest  . Colon cancer Neg Hx    Social History:  reports that she has never smoked. She has never used smokeless tobacco. She reports that she does not drink alcohol or use drugs.  Allergies: No Known Allergies  Medications Prior to Admission  Medication Sig Dispense Refill  . aspirin 325 MG tablet Take 1 tablet (325 mg total) by mouth daily. 30 tablet 4  . atorvastatin (LIPITOR) 40 MG tablet Take 1 tablet (40 mg total) by mouth daily at 6 PM. 30 tablet 0  . clopidogrel (PLAVIX) 75 MG tablet Take 1 tablet (75 mg total) by mouth daily. 30 tablet 1  . losartan (COZAAR) 100 MG tablet Take 1 tablet (100 mg total) by mouth daily. Resume on 3/28 (Patient taking differently: Take 100 mg by mouth daily. ) 30 tablet 0  . Multiple Vitamin (MULTIVITAMIN WITH MINERALS) TABS tablet Take 1 tablet by mouth daily.    . nilotinib (TASIGNA) 150 MG capsule Take 2 capsules (300 mg total) by mouth every 12 (twelve) hours. 120 capsule 6  . polyethylene  glycol (MIRALAX / GLYCOLAX) packet Take 17 g by mouth daily. 14 each 0    ROS: History obtained from patient's daughter.  General ROS: negative for - chills, fatigue, fever, night sweats, weight gain or weight loss Psychological ROS: negative for - behavioral disorder, hallucinations, memory difficulties, mood swings or suicidal ideation Ophthalmic ROS: negative for - blurry vision, double vision, eye pain or loss of vision ENT ROS: negative for - epistaxis, nasal discharge, oral lesions, sore throat, tinnitus or vertigo Allergy and Immunology ROS: negative for - hives or itchy/watery eyes Hematological and Lymphatic ROS: negative  for - bleeding problems, bruising or swollen lymph nodes Endocrine ROS: negative for - galactorrhea, hair pattern changes, polydipsia/polyuria or temperature intolerance Respiratory ROS: negative for - cough, hemoptysis, shortness of breath or wheezing Cardiovascular ROS: negative for - chest pain, dyspnea on exertion, edema or irregular heartbeat Gastrointestinal ROS: negative for - abdominal pain, diarrhea, hematemesis, nausea/vomiting or stool incontinence Genito-Urinary ROS: negative for - dysuria, hematuria, incontinence or urinary frequency/urgency Musculoskeletal ROS: negative for - joint swelling or muscular weakness Neurological ROS: as noted in HPI Dermatological ROS: negative for rash and skin lesion changes  Physical Examination: Blood pressure 110/78, pulse 72, temperature 98.6 F (37 C), temperature source Oral, resp. rate 16, height '5\' 7"'  (1.702 m), weight 64 kg (141 lb 1.6 oz), SpO2 100 %.  HEENT-  Normocephalic, no lesions, without obvious abnormality.  Normal external eye and conjunctiva.  Normal TM's bilaterally.  Normal auditory canals and external ears. Normal external nose, mucus membranes and septum.  Normal pharynx. Neck supple with no masses, nodes, nodules or enlargement. Cardiovascular - regular rate and rhythm, S1, S2 normal, no murmur, click, rub or gallop Lungs - chest clear, no wheezing, rales, normal symmetric air entry Abdomen - soft, non-tender; bowel sounds normal; no masses,  no organomegaly Extremities - no joint deformities, effusion, or inflammation  Neurologic Examination: Mental Status: Alert, oriented, no acute distress.  Speech minimally slurred without evidence of aphasia. Able to follow commands without difficulty. Cranial Nerves: II-Visual fields were normal. III/IV/VI-Pupils were equal and reacted normally to light. Extraocular movements were full and conjugate.    V/VII-no facial numbness and no facial weakness. VIII-normal. X-normal speech  and symmetrical palatal movement. Motor: 5/5 bilaterally with normal tone and bulk Sensory: Normal throughout. Deep Tendon Reflexes: Trace to 1+ and symmetric in upper extremities and absent in lower extremities. Plantars: Flexor bilaterally Carotid auscultation: Normal  Results for orders placed or performed during the hospital encounter of 08/20/16 (from the past 48 hour(s))  CBG monitoring, ED     Status: Abnormal   Collection Time: 08/20/16  4:33 PM  Result Value Ref Range   Glucose-Capillary 131 (H) 65 - 99 mg/dL  Protime-INR     Status: None   Collection Time: 08/20/16  4:34 PM  Result Value Ref Range   Prothrombin Time 13.0 11.4 - 15.2 seconds   INR 0.98   APTT     Status: None   Collection Time: 08/20/16  4:34 PM  Result Value Ref Range   aPTT 27 24 - 36 seconds  CBC     Status: Abnormal   Collection Time: 08/20/16  4:34 PM  Result Value Ref Range   WBC 7.7 4.0 - 10.5 K/uL   RBC 4.47 3.87 - 5.11 MIL/uL   Hemoglobin 12.2 12.0 - 15.0 g/dL   HCT 37.8 36.0 - 46.0 %   MCV 84.6 78.0 - 100.0 fL   MCH 27.3 26.0 - 34.0  pg   MCHC 32.3 30.0 - 36.0 g/dL   RDW 17.0 (H) 11.5 - 15.5 %   Platelets 264 150 - 400 K/uL  Differential     Status: None   Collection Time: 08/20/16  4:34 PM  Result Value Ref Range   Neutrophils Relative % 56 %   Neutro Abs 4.4 1.7 - 7.7 K/uL   Lymphocytes Relative 32 %   Lymphs Abs 2.5 0.7 - 4.0 K/uL   Monocytes Relative 9 %   Monocytes Absolute 0.7 0.1 - 1.0 K/uL   Eosinophils Relative 3 %   Eosinophils Absolute 0.2 0.0 - 0.7 K/uL   Basophils Relative 0 %   Basophils Absolute 0.0 0.0 - 0.1 K/uL  Comprehensive metabolic panel     Status: Abnormal   Collection Time: 08/20/16  4:34 PM  Result Value Ref Range   Sodium 141 135 - 145 mmol/L   Potassium 4.2 3.5 - 5.1 mmol/L   Chloride 110 101 - 111 mmol/L   CO2 21 (L) 22 - 32 mmol/L   Glucose, Bld 127 (H) 65 - 99 mg/dL   BUN 24 (H) 6 - 20 mg/dL   Creatinine, Ser 1.57 (H) 0.44 - 1.00 mg/dL   Calcium  9.6 8.9 - 10.3 mg/dL   Total Protein 7.3 6.5 - 8.1 g/dL   Albumin 3.8 3.5 - 5.0 g/dL   AST 50 (H) 15 - 41 U/L   ALT 37 14 - 54 U/L   Alkaline Phosphatase 132 (H) 38 - 126 U/L   Total Bilirubin 1.2 0.3 - 1.2 mg/dL   GFR calc non Af Amer 32 (L) >60 mL/min   GFR calc Af Amer 37 (L) >60 mL/min    Comment: (NOTE) The eGFR has been calculated using the CKD EPI equation. This calculation has not been validated in all clinical situations. eGFR's persistently <60 mL/min signify possible Chronic Kidney Disease.    Anion gap 10 5 - 15  I-stat troponin, ED     Status: None   Collection Time: 08/20/16  4:41 PM  Result Value Ref Range   Troponin i, poc 0.06 0.00 - 0.08 ng/mL   Comment 3            Comment: Due to the release kinetics of cTnI, a negative result within the first hours of the onset of symptoms does not rule out myocardial infarction with certainty. If myocardial infarction is still suspected, repeat the test at appropriate intervals.   I-Stat Chem 8, ED     Status: Abnormal   Collection Time: 08/20/16  4:43 PM  Result Value Ref Range   Sodium 141 135 - 145 mmol/L   Potassium 4.2 3.5 - 5.1 mmol/L   Chloride 110 101 - 111 mmol/L   BUN 33 (H) 6 - 20 mg/dL   Creatinine, Ser 1.50 (H) 0.44 - 1.00 mg/dL   Glucose, Bld 132 (H) 65 - 99 mg/dL   Calcium, Ion 1.06 (L) 1.15 - 1.40 mmol/L   TCO2 27 0 - 100 mmol/L   Hemoglobin 12.6 12.0 - 15.0 g/dL   HCT 37.0 36.0 - 46.0 %  CBC     Status: Abnormal   Collection Time: 08/20/16 10:48 PM  Result Value Ref Range   WBC 8.4 4.0 - 10.5 K/uL   RBC 4.36 3.87 - 5.11 MIL/uL   Hemoglobin 11.7 (L) 12.0 - 15.0 g/dL   HCT 36.8 36.0 - 46.0 %   MCV 84.4 78.0 - 100.0 fL   MCH 26.8 26.0 -  34.0 pg   MCHC 31.8 30.0 - 36.0 g/dL   RDW 16.9 (H) 11.5 - 15.5 %   Platelets 262 150 - 400 K/uL  Creatinine, serum     Status: Abnormal   Collection Time: 08/20/16 10:48 PM  Result Value Ref Range   Creatinine, Ser 1.47 (H) 0.44 - 1.00 mg/dL   GFR calc non  Af Amer 35 (L) >60 mL/min   GFR calc Af Amer 40 (L) >60 mL/min    Comment: (NOTE) The eGFR has been calculated using the CKD EPI equation. This calculation has not been validated in all clinical situations. eGFR's persistently <60 mL/min signify possible Chronic Kidney Disease.    Dg Chest 2 View  Result Date: 08/20/2016 CLINICAL DATA:  Right-sided chest pain after fall. Increased weakness and slurring of speech with lethargy. EXAM: CHEST  2 VIEW COMPARISON:  None. FINDINGS: AP semi upright view accentuates the cardiac silhouette and tortuosity of the thoracic aorta. There is aortic atherosclerosis at the arch as before. No pneumonic consolidation or pneumothorax. No effusion. No acute displaced rib fracture is noted. Chronic thoracic spondylosis of the dorsal spine. IMPRESSION: Cardiomegaly with aortic atherosclerosis. No acute pulmonary disease. No acute displaced rib fracture identified. Electronically Signed   By: Ashley Royalty M.D.   On: 08/20/2016 19:05   Ct Head Wo Contrast  Result Date: 08/20/2016 CLINICAL DATA:  Fall and weakness. Recent discharge for stroke. Residual slurred speech. EXAM: CT HEAD WITHOUT CONTRAST TECHNIQUE: Contiguous axial images were obtained from the base of the skull through the vertex without intravenous contrast. COMPARISON:  08/17/2016 MRI FINDINGS: Brain: Mild hypodensities along the left corpus callosum for example on image 18/3 reflect the known subacute infarcts in this vicinity. Small remote infarct in the anterior limb right internal capsule/ adjacent caudate head, image 13/3, chronic. Periventricular white matter and corona radiata hypodensities favor chronic ischemic microvascular white matter disease. Otherwise, the brainstem, cerebellum, cerebral peduncles, thalami, basal ganglia, basilar cisterns, and ventricular system appear within normal limits. No new stroke, mass lesion, or intracranial hemorrhage identified. Vascular: There is atherosclerotic  calcification of the cavernous carotid arteries bilaterally. Skull: Osteoma along the outer table of the left frontoparietal skull. This appears stable and has benign imaging characteristics. Sinuses/Orbits: Minimal chronic left maxillary sinusitis. Other: No supplemental non-categorized findings. IMPRESSION: 1. No new/acute intracranial findings are identified. 2. Hypodensities along the left corpus callosum corresponding to the sites of recent infarct shown on MRI from 08/17/2016. 3. Periventricular white matter and corona radiata hypodensities favor chronic ischemic microvascular white matter disease. 4. 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 5. Minimal chronic left maxillary sinusitis. Electronically Signed   By: Van Clines M.D.   On: 08/20/2016 17:43   Mr Brain Wo Contrast  Result Date: 08/20/2016 CLINICAL DATA:  Recent strokes. [place] yesterday going to the bathroom. Worsened weakness and balance disturbance. EXAM: MRI HEAD WITHOUT CONTRAST TECHNIQUE: Multiplanar, multiecho pulse sequences of the brain and surrounding structures were obtained without intravenous contrast. COMPARISON:  Head CT same day.  MRI 08/17/2016 FINDINGS: Brain: There is a newly seen small acute infarction in the right lateral thalamus/ posterior limb internal capsule. Previously seen infarctions in the left anterior cerebral artery territory appear the same. These are best compared in the coronal diffusion imaging. Elsewhere, there are old small vessel insults within the deep brain. No large vessel territory infarction elsewhere. No mass lesion, hemorrhage, hydrocephalus or extra-axial collection. Vascular: Major vessels at the base of the brain show flow.  Skull and upper cervical spine: Negative Sinuses/Orbits: Clear/normal Other: None significant IMPRESSION: Newly seen small infarction in the right lateral thalamus/ posterior limb internal capsule. No significant change in the previously seen  left ACA territory infarctions. Electronically Signed   By: Nelson Chimes M.D.   On: 08/20/2016 20:38    Assessment: 71 y.o. female with multiple risk factors for stroke as well as previous cerebral infarctions including stroke within the last 1 week, presenting with new right thalamic and internal capsular ischemic infarction.  Stroke Risk Factors - hyperlipidemia and hypertension  Plan: 1. HgbA1c, fasting lipid panel 2. MRA  of the brain without contrast 3. PT consult, OT consult 4. Carotid dopplers 5. Prophylactic therapy-Antiplatelet med: Aspirin and Plavix 6. Risk factor modification 7. Telemetry monitoring  C.R. Nicole Kindred, MD [facility] [phone]  08/21/2016, 1:36 AM

## 2016-08-21 NOTE — Progress Notes (Signed)
STROKE TEAM PROGRESS NOTE   SUBJECTIVE (INTERVAL HISTORY) Daughter at bedside. Patient started feeling weak and off balance until she couldn't walk yesterday. Also speech started to be slurred.   OBJECTIVE Temp:  [98.1 F (36.7 C)-98.8 F (37.1 C)] 98.2 F (36.8 C) (06/13 1200) Pulse Rate:  [51-80] 68 (06/13 1200) Cardiac Rhythm: Normal sinus rhythm (06/12 2356) Resp:  [11-19] 16 (06/13 1200) BP: (110-193)/(71-92) 184/90 (06/13 1200) SpO2:  [96 %-100 %] 96 % (06/13 1200) Weight:  [64 kg (141 lb 1.6 oz)-65.8 kg (145 lb)] 64 kg (141 lb 1.6 oz) (06/13 0000)  CBC:  Recent Labs Lab 08/16/16 1713 08/20/16 1634  08/20/16 2248 08/21/16 0448  WBC 6.7 7.7  --  8.4 7.5  NEUTROABS 3.7 4.4  --   --   --   HGB 11.4* 12.2  < > 11.7* 11.1*  HCT 35.7* 37.8  < > 36.8 35.1*  MCV 83.8 84.6  --  84.4 84.8  PLT 272 264  --  262 251  < > = values in this interval not displayed.  Basic Metabolic Panel:  Recent Labs Lab 08/20/16 1634 08/20/16 1643 08/20/16 2248 08/21/16 0448  NA 141 141  --  141  K 4.2 4.2  --  3.7  CL 110 110  --  107  CO2 21*  --   --  23  GLUCOSE 127* 132*  --  100*  BUN 24* 33*  --  18  CREATININE 1.57* 1.50* 1.47* 1.20*  CALCIUM 9.6  --   --  9.3    Lipid Panel:    Component Value Date/Time   CHOL 299 (H) 06/04/2016 0436   TRIG 217 (H) 06/04/2016 0436   HDL 34 (L) 06/04/2016 0436   CHOLHDL 8.8 06/04/2016 0436   VLDL 43 (H) 06/04/2016 0436   LDLCALC 222 (H) 06/04/2016 0436   HgbA1c:  Lab Results  Component Value Date   HGBA1C 6.4 (H) 06/03/2016   Urine Drug Screen:    Component Value Date/Time   LABOPIA NONE DETECTED 06/03/2016 0827   COCAINSCRNUR NONE DETECTED 06/03/2016 0827   LABBENZ NONE DETECTED 06/03/2016 0827   AMPHETMU NONE DETECTED 06/03/2016 0827   THCU NONE DETECTED 06/03/2016 0827   LABBARB NONE DETECTED 06/03/2016 0827    Alcohol Level     Component Value Date/Time   ETH <5 06/03/2016 0827    IMAGING  Ct Head Wo  Page 08/20/2016 1. No new/acute intracranial findings are identified. 2. Hypodensities along the left corpus callosum corresponding to the sites of recent infarct shown on MRI from 08/17/2016. 3. Periventricular white matter and corona radiata hypodensities favor chronic ischemic microvascular white matter disease. 4. 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 5. Minimal chronic left maxillary sinusitis.   Ct Angio Head W Or Wo Page Ct Angio Neck W Or Wo Page 08/21/2016 No extracranial stenosis of significance. Minor atheromatous change in both carotid bifurcations. Hypoplastic/atretic RIGHT A1 anterior cerebral. Dominant/sole contribution from the LEFT A1 ACA which supplies both anterior cerebral is distally. Occluded LEFT posterior cerebral artery in its distal P1 segment, as predicted from prior MRA. 50% stenosis RIGHT P1 PCA. Large thyroid goiter with substernal extension, approximate 6 x 6 x 9 cm. There is mass effect of both the trachea and the aortic arch. Surgical consultation is warranted.   Mr Brain Wo Page 08/20/2016 Newly seen small infarction in the right lateral thalamus/ posterior limb internal capsule. No significant change in the previously seen left ACA  territory infarctions.    PHYSICAL EXAM   ASSESSMENT/PLAN Ms. April Page is a 71 y.o. female with history of HTN, HLD, previous infarcts, CML and arthritis presenting with increasing weakness and difficulty with ambulation. She did not receive IV t-PA due to stroke within past 1 week.   Stroke:   Small R lateral thalamic/PLIC infarcts in setting of recurrent infarcts, concern for embolic source   CT head no acute finding. recent infarct L corpus callosum. Small vessel disease.   CTA H&N no significant stenosis. Mild atheromatous ICA bifurcations. R A1 hypoplastic. Dominant L A1. Occluded L PCA. R P1 50%. Large thyroid goiter w/ trachea and aortic arch goiter.  MRI head small R  lateral thalamic/PLIC infarct  2D Echo from March EF 60-56%. No source of embolus   TEE to look for embolic source. Arranged with [facility] for Friday.  If positive for PFO (patent foramen ovale), check bilateral lower extremity venous dopplers to rule out DVT as possible source of stroke. If negative for thrombus please evaluate for Loop.  If TEE negative, a [place] electrophysiologist will consult and consider placement of an implantable loop recorder to evaluate for atrial fibrillation as etiology of stroke. This has been explained to patient/family by Dr. Jaynee Eagles and they are agreeable.  LDL 222  HgbA1c 6.4  Lovenox 40 mg sq daily for VTE prophylaxis  Diet NPO time specified  DIET DYS 3 Room service appropriate? Yes; Fluid consistency: Thin  aspirin 325 mg daily and clopidogrel 75 mg daily prior to admission, now on aspirin 325 mg daily  Patient counseled to be compliant with her antithrombotic medications  Ongoing aggressive stroke risk factor management  Therapy recommendations: SNF   Disposition:  pending   Hypertension  Stable, 170-180s  Permissive hypertension (OK if < 220/120) but gradually normalize in 5-7 days  Long-term BP goal normotensive  Hyperlipidemia  Home meds:  lipitor 40  resumed lipitor in hospital  LDL 222, goal < 70  Continue statin at discharge  Diabetes  HgbA1c 6.4, goal < 7.0  Controlled  Other Stroke Risk Factors  Advanced age  Hx stroke  Family hx aneuysm (mother died from aneurysm at age 34)  Other Active Problems  Blood dyscrasia  History of cancer (CML)  Hospital day # 0  Personally examined patient and images, and have participated in and made any corrections needed to history, physical, neuro exam,assessment and plan as stated above.  I have personally obtained the history, evaluated lab date, reviewed imaging studies and agree with radiology interpretations.     [name], MD Stroke Neurology   To contact Stroke Continuity provider, please refer to [email or web address] After hours, contact General Neurology

## 2016-08-21 NOTE — Progress Notes (Signed)
On call neurologist, Rapid response  and MD notified on change on status.

## 2016-08-21 NOTE — Evaluation (Signed)
Physical Therapy Evaluation Patient Details Name: April Page MRN: [id number] DOB: 1945-04-02 Today's Date: 08/21/2016   History of Present Illness  Pt is a 71 y/o female admitted secondary to increasing difficulty ambulating and slurred speech. MRI revealed new thalamic infarct extending to the posterior limb of the internal capsule. Of note, pt recently admitted and d/c'd on 6/10 for CVA. PMH including but not limited to CML, HLD, HTN and R femoral IM nail on 04/12/16.  Clinical Impression  Pt presented supine in bed with HOB elevated, awake and willing to participate in therapy session. Prior to admission, pt was using a RW to ambulate within her home and required assistance from family for ADLs. Pt requesting assistance to use bathroom. Pt transferred bed <> BSC with max A. Pt then requesting to eat her lunch as she was just recently cleared for a diet. Pt would continue to benefit from skilled physical therapy services at this time while admitted and after d/c to address the below listed limitations in order to improve overall safety and independence with functional mobility.     Follow Up Recommendations SNF    Equipment Recommendations  None recommended by PT    Recommendations for Other Services       Precautions / Restrictions Precautions Precautions: Fall Restrictions Weight Bearing Restrictions: No      Mobility  Bed Mobility Overal bed mobility: Needs Assistance Bed Mobility: Supine to Sit;Sit to Supine     Supine to sit: Mod assist Sit to supine: Mod assist   General bed mobility comments: increased time, cueing for sequencing, mod A to elevate trunk to achieve sitting EOB and mod A at bilateral LEs to return to supine  Transfers Overall transfer level: Needs assistance Equipment used: 1 person hand held assist Transfers: Sit to/from [employer] Transfers Sit to Stand: Max assist Stand pivot transfers: Max assist       General transfer comment:  increased time, cueing for sequencing, max A to rise and with pivotal movement from bed <> BSC. pt's daughter performing transfer with pt, PT providing min guard for safety  Ambulation/Gait                Stairs            Wheelchair Mobility    Modified Rankin (Stroke Patients Only) Modified Rankin (Stroke Patients Only) Pre-Morbid Rankin Score: Moderate disability Modified Rankin: Severe disability     Balance Overall balance assessment: Needs assistance Sitting-balance support: Feet supported Sitting balance-Leahy Scale: Fair Sitting balance - Comments: pt able to sit EOB with close min guard   Standing balance support: Bilateral upper extremity supported Standing balance-Leahy Scale: Poor Standing balance comment: pt reliant on external supports                             Pertinent Vitals/Pain Pain Assessment: No/denies pain    Home Living Family/patient expects to be discharged to:: Skilled nursing facility                 Additional Comments: pt currently lives alone with daughters rotating as 24/7 caregivers. pt's family does not want her to return home at this time.    Prior Function Level of Independence: Needs assistance   Gait / Transfers Assistance Needed: pt used RW to ambulate within her home  ADL's / Homemaking Assistance Needed: pt requires assistance for dressing and bathing.        Hand Dominance  Dominant Hand: Right    Extremity/Trunk Assessment   Upper Extremity Assessment Upper Extremity Assessment: Defer to OT evaluation    Lower Extremity Assessment Lower Extremity Assessment: Generalized weakness    Cervical / Trunk Assessment Cervical / Trunk Assessment: Kyphotic  Communication   Communication: Expressive difficulties  Cognition Arousal/Alertness: Awake/alert Behavior During Therapy: Flat affect Overall Cognitive Status: Impaired/Different from baseline Area of Impairment: Following  commands;Safety/judgement;Problem solving                       Following Commands: Follows one step commands with increased time Safety/Judgement: Decreased awareness of safety;Decreased awareness of deficits   Problem Solving: Slow processing;Decreased initiation;Difficulty sequencing;Requires verbal cues        General Comments      Exercises     Assessment/Plan    PT Assessment Patient needs continued PT services  PT Problem List Decreased strength;Decreased activity tolerance;Decreased balance;Decreased mobility;Decreased coordination;Decreased cognition;Decreased knowledge of use of DME;Decreased safety awareness;Decreased knowledge of precautions       PT Treatment Interventions DME instruction;Gait training;Stair training;Functional mobility training;Therapeutic activities;Therapeutic exercise;Balance training;Neuromuscular re-education;Patient/family education;Cognitive remediation    PT Goals (Current goals can be found in the Care Plan section)  Acute Rehab PT Goals Patient Stated Goal: none stated PT Goal Formulation: With patient/family Time For Goal Achievement: 09/04/16 Potential to Achieve Goals: Fair    Frequency Min 4X/week   Barriers to discharge        Co-evaluation               AM-PAC PT "6 Clicks" Daily Activity  Outcome Measure Difficulty turning over in bed (including adjusting bedclothes, sheets and blankets)?: Total Difficulty moving from lying on back to sitting on the side of the bed? : Total Difficulty sitting down on and standing up from a chair with arms (e.g., wheelchair, bedside commode, etc,.)?: Total Help needed moving to and from a bed to chair (including a wheelchair)?: A Lot Help needed walking in hospital room?: Total Help needed climbing 3-5 steps with a railing? : Total 6 Click Score: 7    End of Session   Activity Tolerance: Patient tolerated treatment well Patient left: in bed;with call bell/phone within  reach;with family/visitor present Nurse Communication: Mobility status PT Visit Diagnosis: Other abnormalities of gait and mobility (R26.89);Other symptoms and signs involving the nervous system (R29.898)    Time: 7510-2585 PT Time Calculation (min) (ACUTE ONLY): 14 min   Charges:   PT Evaluation $PT Eval Moderate Complexity: 1 Procedure     PT G Codes:   PT G-Codes **NOT FOR INPATIENT CLASS** Functional Assessment Tool Used: AM-PAC 6 Clicks Basic Mobility;Clinical judgement Functional Limitation: Mobility: Walking and moving around Mobility: Walking and Moving Around Current Status (I7782): At least 80 percent but less than 100 percent impaired, limited or restricted Mobility: Walking and Moving Around Goal Status [phone]): At least 20 percent but less than 40 percent impaired, limited or restricted    [facility], [place], DPT Vivian 08/21/2016, 3:45 PM

## 2016-08-21 NOTE — Progress Notes (Signed)
Pt's daughter reported pt was throwing up. Rn went to check on pt. Pt was not following the command of sticking her tongue out, pt was also drooling, slow to respond , speech was slurred ( baseline at admission), pt is very lethargic, no narcotics have been given. vitals T 98.5 , BP 158/88, HR 88 , O2 100 ra . Rapid response called.

## 2016-08-21 NOTE — Evaluation (Addendum)
Occupational Therapy Evaluation Patient Details Name: April Page MRN: [id number] DOB: 03-13-1945 Today's Date: 08/21/2016    History of Present Illness Pt is a 71 y/o female admitted secondary to increasing difficulty ambulating and slurred speech. MRI revealed new thalamic infarct extending to the posterior limb of the internal capsule. Of note, pt recently admitted and d/c'd on 6/10 for CVA. PMH including but not limited to CML, HLD, HTN and R femoral IM nail on 04/12/16.   Clinical Impression   PTA, pt was living alone and had intermittent assistance from her daughters for bathing and dressing and was ambulating with modified independence with RW during functional mobility. Pt currently requires max-total assist overall for ADL and max assist +2 for safety with toilet transfers. She presents with decreased problem solving, B UE weakness, and poor motor planning. She would benefit from continued OT services while admitted to improve independence with ADL and functional mobility. Recommend SNF level rehabilitation post-acute D/C in order to maximize return to PLOF. OT will continue to follow while admitted.     Follow Up Recommendations  SNF;Supervision/Assistance - 24 hour    Equipment Recommendations  Other (comment) (TBD at next venue of care)    Recommendations for Other Services       Precautions / Restrictions Precautions Precautions: Fall Restrictions Weight Bearing Restrictions: No      Mobility Bed Mobility Overal bed mobility: Needs Assistance Bed Mobility: Supine to Sit;Sit to Supine     Supine to sit: Mod assist Sit to supine: Mod assist   General bed mobility comments: increased time, cueing for sequencing, mod A to elevate trunk to achieve sitting EOB and mod A at bilateral LEs to return to supine  Transfers Overall transfer level: Needs assistance Equipment used: 1 person hand held assist Transfers: Sit to/from [employer] Sit to Stand:  Max assist;+2 safety/equipment Stand pivot transfers: Max assist;+2 safety/equipment       General transfer comment: Increased time, cueing for sequencing, max A to rise from bed. Pt's daughter performing transfer with pt per her request.    Balance Overall balance assessment: Needs assistance Sitting-balance support: Feet supported Sitting balance-Leahy Scale: Fair Sitting balance - Comments: Pt able to sit EOB with close min guard   Standing balance support: Bilateral upper extremity supported Standing balance-Leahy Scale: Poor Standing balance comment: Pt reliant on external support.                           ADL either performed or assessed with clinical judgement   ADL Overall ADL's : Needs assistance/impaired Eating/Feeding: Maximal assistance;Sitting Eating/Feeding Details (indicate cue type and reason): Daughter reports pt's attempts to bring one hand to mouth result in bringing both hands to mouth.  Grooming: Sitting;Maximal assistance   Upper Body Bathing: Maximal assistance;Sitting   Lower Body Bathing: Total assistance;Sit to/from stand   Upper Body Dressing : Maximal assistance;Sitting   Lower Body Dressing: Total assistance;Sit to/from stand   Toilet Transfer: Maximal assistance;+2 for safety/equipment;BSC Toilet Transfer Details (indicate cue type and reason): Pt's daughter able to assist Toileting- Clothing Manipulation and Hygiene: Maximal assistance;Sit to/from stand       Functional mobility during ADLs: Maximal assistance;+2 for safety/equipment (stand-pivot only) General ADL Comments: Pt requiring increased response time with ADL participation. Max assist for static sitting balance.       Vision Patient Visual Report: No change from baseline Additional Comments: Will continue to assess. Able to functionally use vision  throughout session.      Perception     Praxis Praxis Praxis tested?: Deficits Deficits: Ideomotor;Limb  apraxia;Organization Praxis-Other Comments: Motor planning deficits noted as pt attempting to move L LE off of bed when attempting to get out of bed on R.    Pertinent Vitals/Pain Pain Assessment: No/denies pain Faces Pain Scale: Hurts little more Pain Intervention(s): Monitored during session     Hand Dominance Right   Extremity/Trunk Assessment Upper Extremity Assessment Upper Extremity Assessment: Generalized weakness   Lower Extremity Assessment Lower Extremity Assessment: Generalized weakness   Cervical / Trunk Assessment Cervical / Trunk Assessment: Kyphotic   Communication Communication Communication: Expressive difficulties   Cognition Arousal/Alertness: Awake/alert Behavior During Therapy: Flat affect Overall Cognitive Status: Impaired/Different from baseline Area of Impairment: Following commands;Safety/judgement;Problem solving                       Following Commands: Follows one step commands with increased time Safety/Judgement: Decreased awareness of safety;Decreased awareness of deficits   Problem Solving: Slow processing;Decreased initiation;Difficulty sequencing;Requires verbal cues General Comments: Pt slow to respond throughout session with minimal verbalization.    General Comments       Exercises     Shoulder Instructions      Home Living Family/patient expects to be discharged to:: Skilled nursing facility                                 Additional Comments: Pt currently lives alone with daughters rotating as 24/7 caregivers. Pt's family does not want her to return home at this time.       Prior Functioning/Environment Level of Independence: Needs assistance  Gait / Transfers Assistance Needed: pt used RW to ambulate within her home ADL's / Homemaking Assistance Needed: pt requires assistance for dressing and bathing.            OT Problem List: Decreased strength;Decreased range of motion;Decreased activity  tolerance;Impaired balance (sitting and/or standing);Decreased cognition;Decreased safety awareness;Decreased coordination;Decreased knowledge of use of DME or AE;Decreased knowledge of precautions;Pain;Impaired UE functional use      OT Treatment/Interventions: Self-care/ADL training;Therapeutic exercise;Energy conservation;DME and/or AE instruction;Therapeutic activities;Patient/family education;Balance training;Cognitive remediation/compensation    OT Goals(Current goals can be found in the care plan section) Acute Rehab OT Goals Patient Stated Goal: to sit in her chair OT Goal Formulation: With patient/family Time For Goal Achievement: 09/04/16 Potential to Achieve Goals: Good ADL Goals Pt Will Perform Eating: with min assist;sitting Pt Will Perform Grooming: with min assist;sitting Pt Will Transfer to Toilet: with min assist;stand pivot transfer;bedside commode Pt/caregiver will Perform Home Exercise Program: Both right and left upper extremity;With Supervision;With written HEP provided Additional ADL Goal #1: Pt will complete bed mobility in preparation for ADL seated at EOB with minimal assistance.   OT Frequency: Min 2X/week   Barriers to D/C:            Co-evaluation              AM-PAC PT "6 Clicks" Daily Activity     Outcome Measure Help from another person eating meals?: A Lot Help from another person taking care of personal grooming?: A Lot Help from another person toileting, which includes using toliet, bedpan, or urinal?: A Lot Help from another person bathing (including washing, rinsing, drying)?: A Lot Help from another person to put on and taking off regular upper body clothing?: A Lot Help from another person  to put on and taking off regular lower body clothing?: A Lot 6 Click Score: 12   End of Session Nurse Communication: Mobility status  Activity Tolerance: Patient tolerated treatment well Patient left: in chair;with call bell/phone within reach;with  family/visitor present  OT Visit Diagnosis: Unsteadiness on feet (R26.81);Other abnormalities of gait and mobility (R26.89);Muscle weakness (generalized) (M62.81);History of falling (Z91.81)                Time: 1550-1610 OT Time Calculation (min): 20 min Charges:  OT General Charges $OT Visit: 1 Procedure OT Evaluation $OT Eval Moderate Complexity: 1 Procedure G-Codes: OT G-codes **NOT FOR INPATIENT CLASS** Functional Assessment Tool Used: AM-PAC 6 Clicks Daily Activity Functional Limitation: Self care Self Care Current Status (T2458): At least 60 percent but less than 80 percent impaired, limited or restricted Self Care Goal Status (K9983): At least 20 percent but less than 40 percent impaired, limited or restricted   Norman Herrlich, MS OTR/L  Pager: [phone] 08/21/2016, 5:27 PM

## 2016-08-21 NOTE — Progress Notes (Signed)
Initial Nutrition Assessment  DOCUMENTATION CODES:   Not applicable  INTERVENTION:    Ensure Enlive po BID, each supplement provides 350 kcal and 20 grams of protein  NUTRITION DIAGNOSIS:   Predicted suboptimal nutrient intake related to dysphagia as evidenced by  (bedside swallow evaluation)  GOAL:   Patient will meet greater than or equal to 90% of their needs  MONITOR:   PO intake, Supplement acceptance, Labs, Weight trends, Skin, I & O's  REASON FOR ASSESSMENT:   Malnutrition Screening Tool  ASSESSMENT:   [name] with history of stroke who was recently discharged on June 10 after being admitted for stroke was brought to the ER after patient's daughter found that patient was having increasing difficulty walking with increasing difficulty swallowing and also slurred speech. MRI of the brain shows new thalamic infarct extending to the posterior limb of the internal capsule. CXR Cardiomegaly with aortic atherosclerosis. No acute pulmonary disease  SLP working with patient upon visit.  Did not disturb. Per nutrition screen, pt eating poorly because of a decreased appetite. Also with recent weight loss without trying.  Per readings below, pt has had an 8% weight loss in < 3 months.  Significant for time frame.  Bedside swallow evaluation completed.  Diet advanced to Dys 3-thin liquids 1038. Labs and medications reviewed. CBG 131.  Unable to complete Nutrition-Focused physical exam at this time.   Diet Order:  Diet NPO time specified DIET DYS 3 Room service appropriate? Yes; Fluid consistency: Thin  Skin:  Wound (see comment) (full thickness to sacrum)  Last BM:  6/5  Height:   Ht Readings from Last 1 Encounters:  08/21/16 5\' 7"  (1.702 m)   Weight:   Wt Readings from Last 1 Encounters:  08/21/16 141 lb 1.6 oz (64 kg)   Wt Readings from Last 10 Encounters:  08/21/16 141 lb 1.6 oz (64 kg)  08/16/16 145 lb 11.6 oz (66.1 kg)  07/10/16 149 lb 6.4 oz (67.8  kg)  06/03/16 153 lb (69.4 kg)  05/24/16 153 lb (69.4 kg)  04/11/16 158 lb (71.7 kg)  04/04/16 158 lb (71.7 kg)  01/24/16 166 lb 6.4 oz (75.5 kg)  10/24/15 175 lb 4.8 oz (79.5 kg)  09/08/15 177 lb (80.3 kg)   Ideal Body Weight:  61.3 kg  BMI:  Body mass index is 22.1 kg/m.  Estimated Nutritional Needs:   Kcal:  1600-1800  Protein:  75-85 gm  Fluid:  1.6-1.8 L  EDUCATION NEEDS:   No education needs identified at this time  Arthur Holms, RD, LDN Pager #: [phone] After-Hours Pager #: [phone]

## 2016-08-21 NOTE — Significant Event (Signed)
Rapid Response Event Note  Overview: Time Called: 1748 Arrival Time: 1750 Event Type: Neurologic  Initial Focused Assessment: Patient admitted with multiple infarcts per notes. Per RN about 1700 Patient was at her baseline since admission.  Alert and weak on the left.  About 15-20 minutes later family called staff to room because patient was vomiting. Assisted patient back to bed. Patient with with increased lethargy and drooling from mouth.  Family at bedside.  Upon my arrival patient sitting upright in the bed.  Lowered head of bed.  Assess patient with NIHSS. 3   Some weakness of left arm and leg but no drift on exam.  Drowsy.  New facial droop.  Dysarthria.   Interventions: Dr Cheral Marker notified Stat head CT done No new orders received.  Plan of Care (if not transferred): Re assess stroke swallow screen prior to giving patient anything by mouth because of new facial weakness. Rn to call if assistance needed.  Event Summary: Name of Physician Notified: Dr Dyann Kief paged by RN at [phone]  Name of Consulting Physician Notified: Dr Cheral Marker at [address], [place]

## 2016-08-21 NOTE — Telephone Encounter (Signed)
Placed order for 30 day event monitor per staff message for Dr.Goodrich. Marland Kitchen

## 2016-08-21 NOTE — Evaluation (Signed)
Clinical/Bedside Swallow Evaluation Patient Details  Name: LOREY PALLETT MRN: [id number] Date of Birth: [birth year]  Today's Date: 08/21/2016 Time: SLP Start Time (ACUTE ONLY): 6144      Past Medical History:  Past Medical History:  Diagnosis Date  . Arthritis    stiff knees  . Blood dyscrasia   . Cancer ([place])   . CVA (cerebral vascular accident) ([place])   . GERD (gastroesophageal reflux disease)   . H/O: CML (chronic myeloid leukemia)   . Hyperlipidemia   . Hyperlipidemia   . Hypertension   . Obesity   . Pessary maintenance 07/01/2013   Past Surgical History:  Past Surgical History:  Procedure Laterality Date  . ABDOMINAL HYSTERECTOMY    . CHOLECYSTECTOMY    . COLONOSCOPY  06/09/2002   RXV:[id number] hemorrhoids; otherwise normal rectum, colon   . COLONOSCOPY N/A 08/25/2013   PJK:DTOIZTI diverticulosis. Single colonic polyp-removed  s/p segmental biopsy and stool sample. random colon bx negative. +benign leiomyoma.  . cystocele/rectocele repair  2009  . ESOPHAGOGASTRODUODENOSCOPY  06/09/2002   WPY:[id number] upper gastrointestinal tract s/p  54-French Maloney dilator  . FEMUR IM NAIL Right 04/12/2016   Procedure: INTRAMEDULLARY (IM) NAIL FEMORAL;  Surgeon: Nicholes Stairs, MD;  Location: [facility];  Service: Orthopedics;  Laterality: Right;  . HERNIA REPAIR    . OOPHORECTOMY     HPI:  Miria Cappelli McCollumis a 71 y.o.femalewith history of stroke who was recently discharged on June 10 after being admitted for stroke was brought to the ER after patient's daughter found that patient was having increasing difficulty walking with increasing difficulty swallowing and also slurred speech. MRI of the brain shows new thalamic infarct extending to the posterior limb of the internal capsule. CXR Cardiomegaly with aortic atherosclerosis. No acute pulmonary disease. No acute displaced rib fracture identified. SLE at [facility] revealed a gradual decline since hip fracture in February and pt is not  quite back to baseline.    Assessment / Plan / Recommendation Clinical Impression  Delayed cough once after cracker, difficult to fully assess impact of solid. She denied globus sensation during this assessment but reports history of. Daughter reports pt swallows pills "but then they come back out.". Suspect difficulty with dual consistencies. Recommend Dys 3 texture, thin and crush pills. ST to follow for appropriateness of recommendations.   SLP Visit Diagnosis: Dysphagia, unspecified (R13.10)    Aspiration Risk  Mild aspiration risk    Diet Recommendation Dysphagia 3 (Mech soft);Thin liquid   Liquid Administration via: Cup;Straw Medication Administration: Crushed with puree Supervision: Patient able to self feed;Intermittent supervision to cue for compensatory strategies Compensations: Minimize environmental distractions;Slow rate;Small sips/bites Postural Changes: Seated upright at 90 degrees    Other  Recommendations Recommended Consults: Consider esophageal assessment Oral Care Recommendations: Oral care BID   Follow up Recommendations  (TBD)      Frequency and Duration min 2x/week  2 weeks       Prognosis Prognosis for Safe Diet Advancement: Good Barriers to Reach Goals: Cognitive deficits      Swallow Study   General HPI: Kassi Esteve McCollumis a 71 y.o.femalewith history of stroke who was recently discharged on June 10 after being admitted for stroke was brought to the ER after patient's daughter found that patient was having increasing difficulty walking with increasing difficulty swallowing and also slurred speech. MRI of the brain shows new thalamic infarct extending to the posterior limb of the internal capsule. CXR Cardiomegaly with aortic atherosclerosis. No acute pulmonary disease. No  acute displaced rib fracture identified. SLE at [facility] revealed a gradual decline since hip fracture in February and pt is not quite back to baseline.  Type of Study: Bedside  Swallow Evaluation Previous Swallow Assessment:  (see HPI) Diet Prior to this Study: NPO (BSE 6/10 rec'd D3, thin) Temperature Spikes Noted: No Respiratory Status: Room air History of Recent Intubation: No Behavior/Cognition: Alert;Cooperative;Requires cueing Oral Cavity Assessment: Excessive secretions Oral Care Completed by SLP: No Oral Cavity - Dentition: Adequate natural dentition;Missing dentition Vision: Functional for self-feeding Self-Feeding Abilities: Needs set up;Needs assist;Able to feed self Patient Positioning: Upright in bed Baseline Vocal Quality: Low vocal intensity Volitional Cough: Weak Volitional Swallow: Able to elicit    Oral/Motor/Sensory Function Overall Oral Motor/Sensory Function: Mild impairment Facial ROM: Reduced right Facial Symmetry: Abnormal symmetry right Facial Strength: Reduced right Facial Sensation: Reduced right Lingual ROM: Reduced right Lingual Symmetry: Within Functional Limits Lingual Strength: Within Functional Limits   Ice Chips Ice chips: Not tested   Thin Liquid Thin Liquid: Within functional limits Presentation: Cup;Straw    Nectar Thick Nectar Thick Liquid: Not tested   Honey Thick Honey Thick Liquid: Not tested   Puree Puree: Within functional limits   Solid   GO   Solid: Impaired Oral Phase Impairments: Reduced lingual movement/coordination Oral Phase Functional Implications: Prolonged oral transit Pharyngeal Phase Impairments: Cough - Delayed    Functional Assessment Tool Used: skilled clinical observation Functional Limitations: Swallowing Swallow Current Status (J2878): At least 20 percent but less than 40 percent impaired, limited or restricted Swallow Goal Status [phone]): At least 1 percent but less than 20 percent impaired, limited or restricted   Houston Siren 08/21/2016,10:41 AM  Orbie Pyo Colvin [employer] [phone]

## 2016-08-22 DIAGNOSIS — E119 Type 2 diabetes mellitus without complications: Secondary | ICD-10-CM

## 2016-08-22 MED ORDER — BOOST / RESOURCE BREEZE PO LIQD
1.0000 | Freq: Three times a day (TID) | ORAL | Status: DC
  Administered 2016-08-22 – 2016-08-24 (×7): 1 via ORAL
  Filled 2016-08-22 (×5): qty 1

## 2016-08-22 MED ORDER — SODIUM CHLORIDE 0.9 % IV SOLN
INTRAVENOUS | Status: DC
  Administered 2016-08-23: 11:00:00 via INTRAVENOUS

## 2016-08-22 NOTE — Progress Notes (Signed)
SLP Cancellation Note  Patient Details Name: April Page MRN: [id number] DOB: 1945-10-16   Cancelled treatment:        NPO for TEE. Will continue efforts.   Houston Siren 08/22/2016, 8:56 AM   Orbie Pyo [employer] [phone]

## 2016-08-22 NOTE — Progress Notes (Signed)
STROKE TEAM PROGRESS NOTE   SUBJECTIVE (INTERVAL HISTORY) Daughters at bedside. Plan for TEE tomorrow, and loop. Daughters are educated and understand.    OBJECTIVE Temp:  [97.8 F (36.6 C)-98.8 F (37.1 C)] 98.8 F (37.1 C) (06/14 0948) Pulse Rate:  [60-88] 72 (06/14 0948) Cardiac Rhythm: Sinus tachycardia (06/14 0700) Resp:  [12-18] 18 (06/14 0948) BP: (158-184)/(77-90) 168/80 (06/14 0948) SpO2:  [96 %-100 %] 98 % (06/14 0948)  CBC:  Recent Labs Lab 08/16/16 1713 08/20/16 1634  08/20/16 2248 08/21/16 0448  WBC 6.7 7.7  --  8.4 7.5  NEUTROABS 3.7 4.4  --   --   --   HGB 11.4* 12.2  < > 11.7* 11.1*  HCT 35.7* 37.8  < > 36.8 35.1*  MCV 83.8 84.6  --  84.4 84.8  PLT 272 264  --  262 251  < > = values in this interval not displayed.  Basic Metabolic Panel:   Recent Labs Lab 08/20/16 1634 08/20/16 1643 08/20/16 2248 08/21/16 0448  NA 141 141  --  141  K 4.2 4.2  --  3.7  CL 110 110  --  107  CO2 21*  --   --  23  GLUCOSE 127* 132*  --  100*  BUN 24* 33*  --  18  CREATININE 1.57* 1.50* 1.47* 1.20*  CALCIUM 9.6  --   --  9.3    Lipid Panel:     Component Value Date/Time   CHOL 299 (H) 06/04/2016 0436   TRIG 217 (H) 06/04/2016 0436   HDL 34 (L) 06/04/2016 0436   CHOLHDL 8.8 06/04/2016 0436   VLDL 43 (H) 06/04/2016 0436   LDLCALC 222 (H) 06/04/2016 0436   HgbA1c:  Lab Results  Component Value Date   HGBA1C 6.4 (H) 06/03/2016   Urine Drug Screen:     Component Value Date/Time   LABOPIA NONE DETECTED 06/03/2016 0827   COCAINSCRNUR NONE DETECTED 06/03/2016 0827   LABBENZ NONE DETECTED 06/03/2016 0827   AMPHETMU NONE DETECTED 06/03/2016 0827   THCU NONE DETECTED 06/03/2016 0827   LABBARB NONE DETECTED 06/03/2016 0827    Alcohol Level     Component Value Date/Time   ETH <5 06/03/2016 0827    IMAGING  Ct Head Wo Contrast 08/20/2016 1. No new/acute intracranial findings are identified. 2. Hypodensities along the left corpus callosum corresponding  to the sites of recent infarct shown on MRI from 08/17/2016. 3. Periventricular white matter and corona radiata hypodensities favor chronic ischemic microvascular white matter disease. 4. 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 5. Minimal chronic left maxillary sinusitis.   Ct Angio Head W Or Wo Contrast Ct Angio Neck W Or Wo Contrast 08/21/2016 No extracranial stenosis of significance. Minor atheromatous change in both carotid bifurcations. Hypoplastic/atretic RIGHT A1 anterior cerebral. Dominant/sole contribution from the LEFT A1 ACA which supplies both anterior cerebral is distally. Occluded LEFT posterior cerebral artery in its distal P1 segment, as predicted from prior MRA. 50% stenosis RIGHT P1 PCA. Large thyroid goiter with substernal extension, approximate 6 x 6 x 9 cm. There is mass effect of both the trachea and the aortic arch. Surgical consultation is warranted.   Mr Brain Wo Contrast 08/20/2016 Newly seen small infarction in the right lateral thalamus/ posterior limb internal capsule. No significant change in the previously seen left ACA territory infarctions.    PHYSICAL EXAM   ASSESSMENT/PLAN [name] is a 71 y.o. female with history of HTN,  HLD, previous infarcts, CML and arthritis presenting with increasing weakness and difficulty with ambulation. She did not receive IV t-PA due to stroke within past 1 week.   Stroke:   Small R lateral thalamic/PLIC infarcts in setting of recurrent infarcts, concern for embolic source   CT head no acute finding. recent infarct L corpus callosum. Small vessel disease.   CTA H&N no significant stenosis. Mild atheromatous ICA bifurcations. R A1 hypoplastic. Dominant L A1. Occluded L PCA. R P1 50%. Large thyroid goiter w/ trachea and aortic arch goiter.  MRI head small R lateral thalamic/PLIC infarct  2D Echo from March EF 60-56%. No source of embolus   TEE to look for embolic source. Arranged with [facility] for Friday.  If positive for PFO (patent foramen ovale), check bilateral lower extremity venous dopplers to rule out DVT as possible source of stroke. If negative for thrombus please evaluate for Loop.  If TEE negative, a [place] electrophysiologist will consult and consider placement of an implantable loop recorder to evaluate for atrial fibrillation as etiology of stroke. This has been explained to patient/family by Dr. Jaynee Page and they are agreeable.  LDL 222  HgbA1c 6.4  Lovenox 40 mg sq daily for VTE prophylaxis DIET DYS 3 Room service appropriate? Yes; Fluid consistency: Thin Diet NPO time specified  aspirin 325 mg daily and clopidogrel 75 mg daily prior to admission, now on aspirin 325 mg daily. Would discharge on previous doses of ASA and Plavix.   Patient counseled to be compliant with her antithrombotic medications  Ongoing aggressive stroke risk factor management  Therapy recommendations: SNF   Disposition:  pending   Hypertension  Stable, 170-180s  Permissive hypertension (OK if < 220/120) but gradually normalize in 5-7 days  Long-term BP goal normotensive  Hyperlipidemia  Home meds:  lipitor 40  resumed lipitor in hospital  LDL 222, goal < 70  Continue statin at discharge  Diabetes  HgbA1c 6.4, goal < 7.0  Controlled  Other Stroke Risk Factors  Advanced age  Hx stroke  Family hx aneuysm (mother died from aneurysm at age 58)  Other Active Problems  Blood dyscrasia  History of cancer (CML)  Hospital day # 1  Personally examined patient and images, and have participated in and made any corrections needed to history, physical, neuro exam,assessment and plan as stated above.  I have personally obtained the history, evaluated lab date, reviewed imaging studies and agree with radiology interpretations.    April Ill, MD Stroke Neurology   To contact Stroke Continuity provider, please  refer to [email or web address] After hours, contact General Neurology

## 2016-08-22 NOTE — NC FL2 (Signed)
[place] LEVEL OF CARE SCREENING TOOL     IDENTIFICATION  Patient Name: April Page Birthdate: [birth year] Sex: female Admission Date (Current Location): 08/20/2016  [facility] and [place] Number:  [employer] and Address:  [facility], [place] [address] [place], [place] [address]      Provider Number: [id number]  Attending Physician Name and Address:  Barton Dubois, MD  Relative Name and Phone Number:       Current Level of Care: Hospital Recommended Level of Care: [place] Prior Approval Number:    Date Approved/Denied:   PASRR Number: [id number]  Discharge Plan: SNF    Current Diagnoses: Patient Active Problem List   Diagnosis Date Noted  . Stroke ([place]) 08/21/2016  . Acute ischemic stroke ([place]) 08/20/2016  . Stroke (cerebrum) ([place]) 08/20/2016  . Pressure injury of skin 08/17/2016  . Acute CVA (cerebrovascular accident) ([place]) 08/17/2016  . UTI (urinary tract infection) 08/16/2016  . Constipation 07/10/2016  . Dysphagia 07/10/2016  . Loss of weight 07/10/2016  . Impaired glucose tolerance 06/05/2016  . Slurred speech   . CVA (cerebral vascular accident) ([place]) 3/18 06/03/2016  . Lightheadedness 04/13/2016  . Hypokalemia 04/13/2016  . Hyperglycemia 04/13/2016  . Essential hypertension 04/13/2016  . Hyperlipidemia 04/13/2016  . S/P right hip fracture 04/12/2016  . Hip fracture ([place]) 04/12/2016  . Fall   . Itching in the vaginal area 10/05/2014  . Chronic myelogenous leukemia (CML), BCR-ABL1-positive ([place])   . GERD (gastroesophageal reflux disease) 12/06/2013  . Leukocytosis 08/06/2013  . Atypical chest pain 08/06/2013  . Chronic diarrhea 08/05/2013  . Pessary maintenance 07/01/2013    Orientation RESPIRATION BLADDER Height & Weight     Self, Time, Place  Normal Continent Weight: 141 lb 1.6 oz (64 kg) Height:  5\' 7"  (170.2 cm)  BEHAVIORAL SYMPTOMS/MOOD NEUROLOGICAL BOWEL NUTRITION STATUS       Continent Diet (dysphagia)  AMBULATORY STATUS COMMUNICATION OF NEEDS Skin   Extensive Assist Verbally PU Stage and Appropriate Care     PU Stage 3 Dressing:  (no info available)                 Personal Care Assistance Level of Assistance  Bathing, Feeding, Dressing Bathing Assistance: Maximum assistance Feeding assistance: Limited assistance Dressing Assistance: Maximum assistance     Functional Limitations Info  Speech     Speech Info: Impaired (dysarthria)    SPECIAL CARE FACTORS FREQUENCY  PT (By licensed PT), OT (By licensed OT)     PT Frequency: 5x/wk OT Frequency: 5x/wk            Contractures      Additional Factors Info  Code Status, Allergies Code Status Info: full Allergies Info: nka           Current Medications (08/22/2016):  This is the current hospital active medication list Current Facility-Administered Medications  Medication Dose Route Frequency Provider Last Rate Last Dose  . acetaminophen (TYLENOL) tablet 650 mg  650 mg Oral Q4H PRN Rise Patience, MD       Or  . acetaminophen (TYLENOL) solution 650 mg  650 mg Per Tube Q4H PRN Rise Patience, MD       Or  . acetaminophen (TYLENOL) suppository 650 mg  650 mg Rectal Q4H PRN Rise Patience, MD      . aspirin suppository 300 mg  300 mg Rectal Daily Rise Patience, MD       Or  .  aspirin tablet 325 mg  325 mg Oral Daily Rise Patience, MD   325 mg at 08/22/16 1158  . atorvastatin (LIPITOR) tablet 40 mg  40 mg Oral q1800 Barton Dubois, MD      . enoxaparin (LOVENOX) injection 40 mg  40 mg Subcutaneous QHS Rise Patience, MD   40 mg at 08/21/16 2215  . feeding supplement (ENSURE ENLIVE) (ENSURE ENLIVE) liquid 237 mL  237 mL Oral BID BM Barton Dubois, MD      . hydrALAZINE (APRESOLINE) injection 10 mg  10 mg Intravenous Q4H PRN Rise Patience, MD      . ondansetron [facility]) injection 4 mg  4 mg Intravenous Q6H PRN Barton Dubois, MD      .  promethazine (PHENERGAN) injection 12.5 mg  12.5 mg Intravenous Once Kerney Elbe, MD         Discharge Medications: Please see discharge summary for a list of discharge medications.  Relevant Imaging Results:  Relevant Lab Results:   Additional Information SS#: [id number]  Geralynn Ochs, LCSW

## 2016-08-22 NOTE — Care Management Note (Signed)
Case Management Note  Patient Details  Name: April Page MRN: [id number] Date of Birth: 06/04/1945  Subjective/Objective:  Patient admitted with CVA. She if from home alone.                   Action/Plan: PT/OT recommending SNF. CM following for d/c disposition.  Expected Discharge Date:  08/22/16               Expected Discharge Plan:  Skilled Nursing Facility  In-House Referral:  Clinical Social Work  Discharge planning Services     Post Acute Care Choice:    Choice offered to:     DME Arranged:    DME Agency:     HH Arranged:    HH Agency:     Status of Service:  In process, will continue to follow  If discussed at Long Length of Stay Meetings, dates discussed:    Additional Comments:  Pollie Friar, RN 08/22/2016, 11:23 AM

## 2016-08-22 NOTE — Progress Notes (Signed)
[facility] PROGRESS NOTE  DANISHA BRASSFIELD RDE:[id number] DOB: 06-19-1945 DOA: 08/20/2016 PCP: Redmond School, MD  Interim summary and HPI 71 y.o. female with history of stroke who was recently discharged on June 10 after being admitted for stroke was brought to the ER after patient's daughter found that patient was having increasing difficulty walking with increasing difficulty swallowing and also slurred speech. Found to have new stroke affecting her internal capsule and right thalamic region.  Assessment/Plan: 1-acute small right lateral thalamic and internal capsule infarcts -With concerns for embolic source, will follow TEE (see below; planned for tomorrow 6/15) -patient still with mild slurred speech; has now passed swallowing test, started on Dys 3 diet with thin liquids.  -After discussing with neurology plan is for TEE on 6/15; base on results patient might end up requiring lower extremity Doppler if she has a positive PFO or insertion of a loop recorder -LDL 222 -A1c 6.4 -Physical therapy/occupational therapy recommending SNF at this moment. -Patient improved overall -Will discuss with neurology regarding continuation of nilotinib  -Will continue aspirin for now  2-hypertension -Stable overall  -Will be permissive with hypertension in the setting of acute CVA  3-hyperlipidemia -Will resume statins   4-diabetes mellitus type 2 -Patient following diet control as an outpatient  -A1c 6.4 demonstrating good control  -For now will monitor CBGs  -Continue modified carbohydrate diet  -No need for sliding scale insulin coverage currently.  5-AKI -Renal function has improved by holding nephrotoxic agents and providing IV fluids. -Follow renal function trend.   6-CML -Continue outpatient follow-up with oncology -Not in remission -Was on nilotinib prior to admission; will most likely resume once workup for stroke completed.   Code Status: Full code Family  Communication: Daughter at bedside Disposition Plan: Patient may need skilled nursing facility when medically stable; will complete stroke workup and follow neurology recommendations. Planning TEE 6/15.   Consultants:  Neurology  Cardiology   Procedures:  See below for x-ray reports   CT head and NecK: without significant stenosis  TEE: planned for 6/15  Antibiotics:  None   HPI/Subjective: Patient is afebrile, denies chest pain, no shortness of breath. Patient is alert, oriented 3; still with mild slurred speech and feeling weak. Slow to response but appropriate.  Objective: Vitals:   08/22/16 1347 08/22/16 1752  BP: (!) 162/86 (!) 175/96  Pulse: 99 65  Resp: 18 18  Temp: 98.8 F (37.1 C) 97.2 F (36.2 C)    Intake/Output Summary (Last 24 hours) at 08/22/16 1832 Last data filed at 08/22/16 1300  Gross per 24 hour  Intake              595 ml  Output                3 ml  Net              592 ml   Filed Weights   08/20/16 1634 08/21/16 0000  Weight: 65.8 kg (145 lb) 64 kg (141 lb 1.6 oz)    Exam:   General: Afebrile, patient is alert, awake and oriented 3; slow to response but appropriate. Following commands and currently denying chest pain, shortness of breath, nausea, vomiting, abdominal pain or any other complaints. Patient is to have mild slurred speech overall but improved from presentation).   Cardiovascular: S1 and S2, no rubs, no gallops, no JVD   Respiratory: No wheezing, no crackles, no rales, good air movement bilaterally; no using accessory muscles.   Abdomen:  Soft, nontender, nondistended, positive bowel sounds.  Musculoskeletal: No edema, no cyanosis or clubbing.   Neurologic exam: Patient awake, alert and oriented 3; able to follow commands even slow to response. Cranial nerve grossly intact . Mild slurred speech appreciated. Muscle strength 3-4/5 bilaterally symmetrically upper and lower extremities.  Data Reviewed: Basic Metabolic  Panel:  Recent Labs Lab 08/16/16 1713 08/20/16 1634 08/20/16 1643 08/20/16 2248 08/21/16 0448  NA 140 141 141  --  141  K 3.6 4.2 4.2  --  3.7  CL 107 110 110  --  107  CO2 25 21*  --   --  23  GLUCOSE 119* 127* 132*  --  100*  BUN 13 24* 33*  --  18  CREATININE 1.02* 1.57* 1.50* 1.47* 1.20*  CALCIUM 9.6 9.6  --   --  9.3   Liver Function Tests:  Recent Labs Lab 08/20/16 1634 08/21/16 0448  AST 50* 39  ALT 37 37  ALKPHOS 132* 116  BILITOT 1.2 1.0  PROT 7.3 6.6  ALBUMIN 3.8 3.4*   CBC:  Recent Labs Lab 08/16/16 1713 08/20/16 1634 08/20/16 1643 08/20/16 2248 08/21/16 0448  WBC 6.7 7.7  --  8.4 7.5  NEUTROABS 3.7 4.4  --   --   --   HGB 11.4* 12.2 12.6 11.7* 11.1*  HCT 35.7* 37.8 37.0 36.8 35.1*  MCV 83.8 84.6  --  84.4 84.8  PLT 272 264  --  262 251   CBG:  Recent Labs Lab 08/17/16 1621 08/17/16 2151 08/18/16 0750 08/18/16 1140 08/20/16 1633  GLUCAP 148* 112* 110* 129* 131*    Recent Results (from the past 240 hour(s))  Urine culture     Status: Abnormal   Collection Time: 08/16/16  9:16 PM  Result Value Ref Range Status   Specimen Description URINE, CLEAN CATCH  Final   Special Requests NONE  Final   Culture MULTIPLE SPECIES PRESENT, SUGGEST RECOLLECTION (A)  Final   Report Status 08/18/2016 FINAL  Final     Studies: Ct Angio Head W Or Wo Contrast  Result Date: 08/21/2016 CLINICAL DATA:  Acute multifocal infarcts. Worsening of weakness and ambulatory difficulty. EXAM: CT ANGIOGRAPHY HEAD AND NECK TECHNIQUE: Multidetector CT imaging of the head and neck was performed using the standard protocol during bolus administration of intravenous contrast. Multiplanar CT image reconstructions and MIPs were obtained to evaluate the vascular anatomy. Carotid stenosis measurements (when applicable) are obtained utilizing NASCET criteria, using the distal internal carotid diameter as the denominator. CONTRAST:  Isovue 370, 50 mL COMPARISON:  Multiple prior MR  scans, most recent 08/20/2016. FINDINGS: CTA NECK Aortic arch: Conventional branching. Significant mass effect due to a large substernal goiter, with displacement of the trachea to the RIGHT, and the arch anteriorly and to the LEFT. Imaged portion shows no evidence of aneurysm or dissection. No significant stenosis of the major arch vessel origins. Right carotid system: Minor atheromatous change. No evidence of dissection, stenosis (50% or greater) or occlusion. Left carotid system: Minor atheromatous change. No measurable stenosis. No evidence of dissection, or occlusion. Vertebral arteries: BILATERAL patent, LEFT dominant. Both vertebral arteries are stretched. No evidence of dissection, stenosis (50% or greater) or occlusion. Nonvascular soft tissues: Large thyroid goiter, approximately 6 x 6 x 9 cm, with significant mass effect on the superior mediastinal and lower LEFT neck structures. No lung apex lesion. Spondylosis. CTA HEAD Anterior circulation: Mild calcific atheromatous change of the skull-base cavernous internal carotid arteries. Hypoplastic/atretic RIGHT anterior cerebral  artery A1 segment. Both anterior cerebral arteries are fed the LEFT. No MCA M1 or M2 stenosis of significance. No proximal occlusion, aneurysm, or vascular malformation. Posterior circulation: Basilar artery widely patent with LEFT vertebral dominant. Atheromatous change in the BILATERAL V4 segments without significant stenosis. 50% stenosis RIGHT P1 PCA. Occluded LEFT PCA in its distal P1 segment. There is slight reconstitution distally. This correlates with the observed pattern of pericallosal infarction involving the splenium. No aneurysm, or vascular malformation. Venous sinuses: As permitted by contrast timing, patent. Anatomic variants: None of significance. Delayed phase: No abnormal intracranial enhancement. Acute, subacute, and chronic infarctions as previously noted. Review of the MIP images confirms the above findings.  IMPRESSION: No extracranial stenosis of significance. Minor atheromatous change in both carotid bifurcations. Hypoplastic/atretic RIGHT A1 anterior cerebral. Dominant/sole contribution from the LEFT A1 ACA which supplies both anterior cerebral is distally. Occluded LEFT posterior cerebral artery in its distal P1 segment, as predicted from prior MRA. 50% stenosis RIGHT P1 PCA. Large thyroid goiter with substernal extension, approximate 6 x 6 x 9 cm. There is mass effect of both the trachea and the aortic arch. Surgical consultation is warranted. Electronically Signed   By: Staci Righter M.D.   On: 08/21/2016 13:26   Dg Chest 2 View  Result Date: 08/20/2016 CLINICAL DATA:  Right-sided chest pain after fall. Increased weakness and slurring of speech with lethargy. EXAM: CHEST  2 VIEW COMPARISON:  None. FINDINGS: AP semi upright view accentuates the cardiac silhouette and tortuosity of the thoracic aorta. There is aortic atherosclerosis at the arch as before. No pneumonic consolidation or pneumothorax. No effusion. No acute displaced rib fracture is noted. Chronic thoracic spondylosis of the dorsal spine. IMPRESSION: Cardiomegaly with aortic atherosclerosis. No acute pulmonary disease. No acute displaced rib fracture identified. Electronically Signed   By: Ashley Royalty M.D.   On: 08/20/2016 19:05   Ct Head Wo Contrast  Result Date: 08/21/2016 CLINICAL DATA:  71 year old female with recent left ACA and right PCA territory infarcts. Worsening mental status. EXAM: CT HEAD WITHOUT CONTRAST TECHNIQUE: Contiguous axial images were obtained from the base of the skull through the vertex without intravenous contrast. COMPARISON:  CTA head and neck 1238 hours today, brain MRI and noncontrast head CT 08/20/2016 and earlier. FINDINGS: Brain: Distal left ACA territory infarct remains subtle by CT. The more recent right lateral thalamus/ right internal capsule lacune is also poorly visible by CT. No acute intracranial  hemorrhage identified. No midline shift, mass effect, or evidence of intracranial mass lesion. Chronic right basal ganglia lacunar infarct. Stable gray-white matter differentiation since the CT yesterday. No ventriculomegaly. Vascular: Calcified atherosclerosis at the skull base. Skull: No acute osseous abnormality identified. Sinuses/Orbits: Visualized paranasal sinuses and mastoids are stable and well pneumatized. Other: No acute orbit or scalp soft tissue findings. Trace retained secretions in the pharynx. IMPRESSION: 1. No new acute intracranial abnormality. Stable non contrast CT appearance of the brain since yesterday. 2. Left ACA and right thalamic infarcts remain poorly visible by CT. No associated hemorrhage or mass effect. Electronically Signed   By: Genevie Ann M.D.   On: 08/21/2016 18:44   Ct Angio Neck W Or Wo Contrast  Result Date: 08/21/2016 CLINICAL DATA:  Acute multifocal infarcts. Worsening of weakness and ambulatory difficulty. EXAM: CT ANGIOGRAPHY HEAD AND NECK TECHNIQUE: Multidetector CT imaging of the head and neck was performed using the standard protocol during bolus administration of intravenous contrast. Multiplanar CT image reconstructions and MIPs were obtained to evaluate the  vascular anatomy. Carotid stenosis measurements (when applicable) are obtained utilizing NASCET criteria, using the distal internal carotid diameter as the denominator. CONTRAST:  Isovue 370, 50 mL COMPARISON:  Multiple prior MR scans, most recent 08/20/2016. FINDINGS: CTA NECK Aortic arch: Conventional branching. Significant mass effect due to a large substernal goiter, with displacement of the trachea to the RIGHT, and the arch anteriorly and to the LEFT. Imaged portion shows no evidence of aneurysm or dissection. No significant stenosis of the major arch vessel origins. Right carotid system: Minor atheromatous change. No evidence of dissection, stenosis (50% or greater) or occlusion. Left carotid system: Minor  atheromatous change. No measurable stenosis. No evidence of dissection, or occlusion. Vertebral arteries: BILATERAL patent, LEFT dominant. Both vertebral arteries are stretched. No evidence of dissection, stenosis (50% or greater) or occlusion. Nonvascular soft tissues: Large thyroid goiter, approximately 6 x 6 x 9 cm, with significant mass effect on the superior mediastinal and lower LEFT neck structures. No lung apex lesion. Spondylosis. CTA HEAD Anterior circulation: Mild calcific atheromatous change of the skull-base cavernous internal carotid arteries. Hypoplastic/atretic RIGHT anterior cerebral artery A1 segment. Both anterior cerebral arteries are fed the LEFT. No MCA M1 or M2 stenosis of significance. No proximal occlusion, aneurysm, or vascular malformation. Posterior circulation: Basilar artery widely patent with LEFT vertebral dominant. Atheromatous change in the BILATERAL V4 segments without significant stenosis. 50% stenosis RIGHT P1 PCA. Occluded LEFT PCA in its distal P1 segment. There is slight reconstitution distally. This correlates with the observed pattern of pericallosal infarction involving the splenium. No aneurysm, or vascular malformation. Venous sinuses: As permitted by contrast timing, patent. Anatomic variants: None of significance. Delayed phase: No abnormal intracranial enhancement. Acute, subacute, and chronic infarctions as previously noted. Review of the MIP images confirms the above findings. IMPRESSION: No extracranial stenosis of significance. Minor atheromatous change in both carotid bifurcations. Hypoplastic/atretic RIGHT A1 anterior cerebral. Dominant/sole contribution from the LEFT A1 ACA which supplies both anterior cerebral is distally. Occluded LEFT posterior cerebral artery in its distal P1 segment, as predicted from prior MRA. 50% stenosis RIGHT P1 PCA. Large thyroid goiter with substernal extension, approximate 6 x 6 x 9 cm. There is mass effect of both the trachea and  the aortic arch. Surgical consultation is warranted. Electronically Signed   By: Staci Righter M.D.   On: 08/21/2016 13:26   Mr Brain Wo Contrast  Result Date: 08/20/2016 CLINICAL DATA:  Recent strokes. [place] yesterday going to the bathroom. Worsened weakness and balance disturbance. EXAM: MRI HEAD WITHOUT CONTRAST TECHNIQUE: Multiplanar, multiecho pulse sequences of the brain and surrounding structures were obtained without intravenous contrast. COMPARISON:  Head CT same day.  MRI 08/17/2016 FINDINGS: Brain: There is a newly seen small acute infarction in the right lateral thalamus/ posterior limb internal capsule. Previously seen infarctions in the left anterior cerebral artery territory appear the same. These are best compared in the coronal diffusion imaging. Elsewhere, there are old small vessel insults within the deep brain. No large vessel territory infarction elsewhere. No mass lesion, hemorrhage, hydrocephalus or extra-axial collection. Vascular: Major vessels at the base of the brain show flow. Skull and upper cervical spine: Negative Sinuses/Orbits: Clear/normal Other: None significant IMPRESSION: Newly seen small infarction in the right lateral thalamus/ posterior limb internal capsule. No significant change in the previously seen left ACA territory infarctions. Electronically Signed   By: Nelson Chimes M.D.   On: 08/20/2016 20:38    Scheduled Meds: . aspirin  300 mg Rectal Daily   Or  .  aspirin  325 mg Oral Daily  . atorvastatin  40 mg Oral q1800  . enoxaparin (LOVENOX) injection  40 mg Subcutaneous QHS  . feeding supplement  1 Container Oral TID BM  . promethazine  12.5 mg Intravenous Once   Continuous Infusions:   Principal Problem:   Acute ischemic stroke ([place]) Active Problems:   Chronic myelogenous leukemia (CML), BCR-ABL1-positive ([place])   Essential hypertension   Stroke (cerebrum) ([place])   Stroke ([place])    Time spent: 25 minutes    Barton Dubois  [facility] Pager [phone]. If 7PM-7AM, please contact night-coverage at [email or web address], password [facility] 08/22/2016, 6:32 PM  LOS: 1 day

## 2016-08-22 NOTE — Clinical Social Work Note (Signed)
Clinical Social Work Assessment  Patient Details  Name: April Page MRN: [id number] Date of Birth: 22-May-1945  Date of referral:  08/22/16               Reason for consult:  Facility Placement, Discharge Planning                Permission sought to share information with:  Facility Sport and exercise psychologist, Family Supports Permission granted to share information::  Yes, Verbal Permission Granted  Name::     Astronomer::  SNF  Relationship::  Daughter  Contact Information:     Housing/Transportation Living arrangements for the past 2 months:  Apartment Source of Information:  Adult Children Patient Interpreter Needed:  None Criminal Activity/Legal Involvement Pertinent to Current Situation/Hospitalization:  No - Comment as needed Significant Relationships:  Adult Children Lives with:  Self Do you feel safe going back to the place where you live?  Yes Need for family participation in patient care:  No (Coment)  Care giving concerns:  Patient has been residing at home alone but currently needs a higher level of care and support in order to function and complete ADLs.   Social Worker assessment / plan:  CSW introduced self to patient and patient's daughter, April Page, at bedside. CSW explained recommendation for SNF placement and discussed with patient's daughter. Patient's daughter indicated preference for facility in [place]. CSW explained role in discharge planning process and will follow to facilitate discharge to SNF when medically appropriate.  Employment status:  Retired Nurse, adult PT Recommendations:  [place] / Referral to community resources:     Patient/Family's Response to care:  Patient and patient's daughter agreeable to SNF placement.  Patient/Family's Understanding of and Emotional Response to Diagnosis, Current Treatment, and Prognosis:  Patient and patient's daughter seem to understand current  functional limitations and deficits. Patient and patient's daughter seem to understand the increased level of care required at this time. Patient and patient's daughter indicated understanding of CSW role in discharge planning process.  Emotional Assessment Appearance:  Appears stated age Attitude/Demeanor/Rapport:    Affect (typically observed):  Appropriate Orientation:  Oriented to Self, Oriented to Place, Oriented to  Time Alcohol / Substance use:  Not Applicable Psych involvement (Current and /or in the community):  No (Comment)  Discharge Needs  Concerns to be addressed:  Care Coordination, Discharge Planning Concerns Readmission within the last 30 days:  Yes Current discharge risk:  Lives alone, Physical Impairment Barriers to Discharge:  Continued Medical Work up   [employer], [place] 08/22/2016, 1:57 PM

## 2016-08-22 NOTE — Progress Notes (Signed)
    [facility] has been requested to perform a transesophageal echocardiogram on April Page for TEE for CVA scheduled tomorrow at 12pm.   After careful review of history and examination, the risks and benefits of transesophageal echocardiogram have been explained including risks of esophageal damage, perforation (1:10,000 risk), bleeding, pharyngeal hematoma as well as other potential complications associated with conscious sedation including aspiration, arrhythmia, respiratory failure and death. Alternatives to treatment were discussed, questions were answered. Patient is willing to proceed. NPO after midnight.   Angelena Form, PA-C  08/22/2016 10:07 AM

## 2016-08-23 ENCOUNTER — Encounter (HOSPITAL_COMMUNITY)
Admission: EM | Disposition: A | Discharge status: Skilled Nursing Facility | Payer: Self-pay | Source: Home / Self Care | Attending: Internal Medicine

## 2016-08-23 ENCOUNTER — Inpatient Hospital Stay (HOSPITAL_COMMUNITY): Payer: Medicare HMO

## 2016-08-23 ENCOUNTER — Encounter (HOSPITAL_COMMUNITY): Payer: Self-pay | Admitting: *Deleted

## 2016-08-23 DIAGNOSIS — I638 Other cerebral infarction: Secondary | ICD-10-CM

## 2016-08-23 DIAGNOSIS — I639 Cerebral infarction, unspecified: Secondary | ICD-10-CM

## 2016-08-23 DIAGNOSIS — I69391 Dysphagia following cerebral infarction: Secondary | ICD-10-CM

## 2016-08-23 HISTORY — PX: LOOP RECORDER INSERTION: EP1214

## 2016-08-23 HISTORY — PX: TEE WITHOUT CARDIOVERSION: SHX5443

## 2016-08-23 LAB — BASIC METABOLIC PANEL
ANION GAP: 8 (ref 5–15)
BUN: 15 mg/dL (ref 6–20)
CALCIUM: 9.6 mg/dL (ref 8.9–10.3)
CO2: 25 mmol/L (ref 22–32)
Chloride: 107 mmol/L (ref 101–111)
Creatinine, Ser: 1.08 mg/dL — ABNORMAL HIGH (ref 0.44–1.00)
GFR calc Af Amer: 58 mL/min — ABNORMAL LOW (ref >60)
GFR, EST NON AFRICAN AMERICAN: 50 mL/min — AB (ref >60)
GLUCOSE: 94 mg/dL (ref 65–99)
Potassium: 3.4 mmol/L — ABNORMAL LOW (ref 3.5–5.1)
Sodium: 140 mmol/L (ref 135–145)

## 2016-08-23 LAB — CBC
HCT: 36 % (ref 36.0–46.0)
Hemoglobin: 11.4 g/dL — ABNORMAL LOW (ref 12.0–15.0)
MCH: 26.9 pg (ref 26.0–34.0)
MCHC: 31.7 g/dL (ref 30.0–36.0)
MCV: 84.9 fL (ref 78.0–100.0)
PLATELETS: 259 10*3/uL (ref 150–400)
RBC: 4.24 MIL/uL (ref 3.87–5.11)
RDW: 16.8 % — AB (ref 11.5–15.5)
WBC: 6 10*3/uL (ref 4.0–10.5)

## 2016-08-23 SURGERY — LOOP RECORDER INSERTION

## 2016-08-23 SURGERY — ECHOCARDIOGRAM, TRANSESOPHAGEAL
Anesthesia: Moderate Sedation

## 2016-08-23 MED ORDER — LIDOCAINE VISCOUS 2 % MT SOLN
OROMUCOSAL | Status: DC | PRN
  Administered 2016-08-23: 10 mL via OROMUCOSAL

## 2016-08-23 MED ORDER — LIDOCAINE-EPINEPHRINE 1 %-1:100000 IJ SOLN
INTRAMUSCULAR | Status: AC
  Filled 2016-08-23: qty 1

## 2016-08-23 MED ORDER — HYDRALAZINE HCL 20 MG/ML IJ SOLN
INTRAMUSCULAR | Status: DC | PRN
  Administered 2016-08-23: 5 mg via INTRAVENOUS

## 2016-08-23 MED ORDER — MIDAZOLAM HCL 10 MG/2ML IJ SOLN
INTRAMUSCULAR | Status: DC | PRN
  Administered 2016-08-23 (×2): 2 mg via INTRAVENOUS

## 2016-08-23 MED ORDER — MIDAZOLAM HCL 5 MG/ML IJ SOLN
INTRAMUSCULAR | Status: AC
  Filled 2016-08-23: qty 2

## 2016-08-23 MED ORDER — LIDOCAINE HCL (PF) 1 % IJ SOLN
INTRAMUSCULAR | Status: DC | PRN
  Administered 2016-08-23: 5 mL

## 2016-08-23 MED ORDER — FENTANYL CITRATE (PF) 100 MCG/2ML IJ SOLN
INTRAMUSCULAR | Status: AC
  Filled 2016-08-23: qty 2

## 2016-08-23 MED ORDER — FENTANYL CITRATE (PF) 100 MCG/2ML IJ SOLN
INTRAMUSCULAR | Status: DC | PRN
  Administered 2016-08-23: 12.5 ug via INTRAVENOUS
  Administered 2016-08-23: 25 ug via INTRAVENOUS

## 2016-08-23 MED ORDER — LIDOCAINE VISCOUS 2 % MT SOLN
OROMUCOSAL | Status: AC
  Filled 2016-08-23: qty 15

## 2016-08-23 MED ORDER — HYDRALAZINE HCL 20 MG/ML IJ SOLN
INTRAMUSCULAR | Status: AC
  Filled 2016-08-23: qty 1

## 2016-08-23 SURGICAL SUPPLY — 2 items
LOOP REVEAL LINQSYS (Prosthesis & Implant Heart) ×3 IMPLANT
PACK LOOP INSERTION (CUSTOM PROCEDURE TRAY) ×3 IMPLANT

## 2016-08-23 NOTE — Progress Notes (Signed)
PT Cancellation Note  Patient Details Name: April Page MRN: [id number] DOB: 1946/03/09   Cancelled Treatment:    Reason Eval/Treat Not Completed: Patient at procedure or test/unavailable.  Pt gone to TEE this a.m.  Will return later to see pt as able. 08/23/2016  Donnella Sham, [place] [phone]  (pager)   [name] April Page 08/23/2016, 11:56 AM

## 2016-08-23 NOTE — Progress Notes (Signed)
[facility] PROGRESS NOTE  April Page [id number] DOB: 23-May-1945 DOA: 08/20/2016 PCP: Redmond School, MD  Interim summary and HPI 71 y.o. female with history of stroke who was recently discharged on June 10 after being admitted for stroke was brought to the ER after patient's daughter found that patient was having increasing difficulty walking with increasing difficulty swallowing and also slurred speech. Found to have new stroke affecting her internal capsule and right thalamic region.  Assessment/Plan: 1-acute small right lateral thalamic and internal capsule infarcts -patient still with mild slurred speech and residual dysphagia; using Dys 3 diet with thin liquids -After discussing with neurology/cardiology plan is for TEE later today 6/15; base on results patient might end up requiring lower extremity Doppler if she has a positive PFO or insertion of a loop recorder (EP is aware and on board) -LDL 222 -A1c 6.4 -patient condition has improved and still has some deficit is demonstrating preserved mentation and good insight. Will need SNF when medically ready -Will resume nilotinib at discharge; will need to discussed with oncology service about hypercoagulable state. -Will continue aspirin and plavix for secondary prevention  2-hypertension -Stable overall  -Will be permissive with hypertension in the setting of acute CVA -slowly will resume antihypertensive regimen to achieved long term normotensive state   3-hyperlipidemia -continue statins   4-diabetes mellitus type 2 -Patient following diet control as an outpatient  -A1c 6.4 demonstrating good control  -will continue monitoring CBG's; not coverage required currently  -continue modified carb diet    5-AKI -Follow renal function trend.  -currently stable and back to baseline after IVF's and holding nephrotoxic agents   6-CML -Continue outpatient follow-up with oncology -Not in remission -Was on nilotinib  prior to admission; will most likely resume once workup for stroke completed.   Code Status: Full code Family Communication: Daughter at bedside Disposition Plan: Patient may need skilled nursing facility when medically stable; will complete stroke workup and follow neurology recommendations. Planning TEE later today 6/15; base on results might required loop recorder insertion as well.    Consultants:  Neurology  Cardiology   Procedures:  See below for x-ray reports   CT head and NecK: without significant stenosis  TEE: planned for later today 6/15  Antibiotics:  None   HPI/Subjective: Afebrile, no CP, no SOB, no abdominal pain and denying dysuria. Patient still with slurred speech and muscle weakness.  Objective: Vitals:   08/23/16 1240 08/23/16 1400  BP: (!) 165/87 (!) 159/69  Pulse: 74 (!) 59  Resp: 15 16  Temp:  98.4 F (36.9 C)    Intake/Output Summary (Last 24 hours) at 08/23/16 1704 Last data filed at 08/23/16 0300  Gross per 24 hour  Intake              160 ml  Output                0 ml  Net              160 ml   Filed Weights   08/20/16 1634 08/21/16 0000  Weight: 65.8 kg (145 lb) 64 kg (141 lb 1.6 oz)    Exam:   General: patient remains afebrile, AAOX3 and denies CP, abd pain, nausea, vomiting and palpitations. Patient still weak and with mild slurred speech present, able to answer questions appropriately, but slow to response.   Cardiovascular: S1 and S2, no rubs, no gallops ; no JVD  Respiratory: good air movement, no wheezing, no crackles  Abdomen: soft, NT, ND, positive BS  Musculoskeletal: no edema, no cyanosis, no clubbing   Neurologic exam: Patient remains AAOX3. Patient responding appropriately and is following commands. CN grossly intact. Still with slurred speech and MS 3-4/5 bilaterally; normal bulk tone on exam.  Data Reviewed: Basic Metabolic Panel:  Recent Labs Lab 08/16/16 1713 08/20/16 1634 08/20/16 1643  08/20/16 2248 08/21/16 0448 08/23/16 0641  NA 140 141 141  --  141 140  K 3.6 4.2 4.2  --  3.7 3.4*  CL 107 110 110  --  107 107  CO2 25 21*  --   --  23 25  GLUCOSE 119* 127* 132*  --  100* 94  BUN 13 24* 33*  --  18 15  CREATININE 1.02* 1.57* 1.50* 1.47* 1.20* 1.08*  CALCIUM 9.6 9.6  --   --  9.3 9.6   Liver Function Tests:  Recent Labs Lab 08/20/16 1634 08/21/16 0448  AST 50* 39  ALT 37 37  ALKPHOS 132* 116  BILITOT 1.2 1.0  PROT 7.3 6.6  ALBUMIN 3.8 3.4*   CBC:  Recent Labs Lab 08/16/16 1713 08/20/16 1634 08/20/16 1643 08/20/16 2248 08/21/16 0448 08/23/16 0641  WBC 6.7 7.7  --  8.4 7.5 6.0  NEUTROABS 3.7 4.4  --   --   --   --   HGB 11.4* 12.2 12.6 11.7* 11.1* 11.4*  HCT 35.7* 37.8 37.0 36.8 35.1* 36.0  MCV 83.8 84.6  --  84.4 84.8 84.9  PLT 272 264  --  262 251 259   CBG:  Recent Labs Lab 08/17/16 1621 08/17/16 2151 08/18/16 0750 08/18/16 1140 08/20/16 1633  GLUCAP 148* 112* 110* 129* 131*    Recent Results (from the past 240 hour(s))  Urine culture     Status: Abnormal   Collection Time: 08/16/16  9:16 PM  Result Value Ref Range Status   Specimen Description URINE, CLEAN CATCH  Final   Special Requests NONE  Final   Culture MULTIPLE SPECIES PRESENT, SUGGEST RECOLLECTION (A)  Final   Report Status 08/18/2016 FINAL  Final     Studies: Ct Head Wo Contrast  Result Date: 08/21/2016 CLINICAL DATA:  71 year old female with recent left ACA and right PCA territory infarcts. Worsening mental status. EXAM: CT HEAD WITHOUT CONTRAST TECHNIQUE: Contiguous axial images were obtained from the base of the skull through the vertex without intravenous contrast. COMPARISON:  CTA head and neck 1238 hours today, brain MRI and noncontrast head CT 08/20/2016 and earlier. FINDINGS: Brain: Distal left ACA territory infarct remains subtle by CT. The more recent right lateral thalamus/ right internal capsule lacune is also poorly visible by CT. No acute intracranial  hemorrhage identified. No midline shift, mass effect, or evidence of intracranial mass lesion. Chronic right basal ganglia lacunar infarct. Stable gray-white matter differentiation since the CT yesterday. No ventriculomegaly. Vascular: Calcified atherosclerosis at the skull base. Skull: No acute osseous abnormality identified. Sinuses/Orbits: Visualized paranasal sinuses and mastoids are stable and well pneumatized. Other: No acute orbit or scalp soft tissue findings. Trace retained secretions in the pharynx. IMPRESSION: 1. No new acute intracranial abnormality. Stable non contrast CT appearance of the brain since yesterday. 2. Left ACA and right thalamic infarcts remain poorly visible by CT. No associated hemorrhage or mass effect. Electronically Signed   By: Genevie Ann M.D.   On: 08/21/2016 18:44    Scheduled Meds: . aspirin  300 mg Rectal Daily   Or  . aspirin  325 mg Oral  Daily  . atorvastatin  40 mg Oral q1800  . enoxaparin (LOVENOX) injection  40 mg Subcutaneous QHS  . feeding supplement  1 Container Oral TID BM  . promethazine  12.5 mg Intravenous Once   Continuous Infusions:   Principal Problem:   Acute ischemic stroke ([place]) Active Problems:   Chronic myelogenous leukemia (CML), BCR-ABL1-positive ([place])   Essential hypertension   Stroke (cerebrum) ([place])   Stroke ([place])    Time spent: 25 minutes    Barton Dubois  [facility] Pager [phone]. If 7PM-7AM, please contact night-coverage at [email or web address], password [email or web address] 08/23/2016, 5:04 PM  LOS: 2 days

## 2016-08-23 NOTE — Progress Notes (Signed)
OT Cancellation Note  Patient Details Name: April Page MRN: [id number] DOB: Feb 18, 1946   Cancelled Treatment:    Reason Eval/Treat Not Completed: Patient at procedure or test/ unavailable;Fatigue/lethargy limiting ability to participate.  Pt having recently returned from TEE and still lethargic from procedure.  Family present and deferring therapy at this time.  Will continue to follow as able.  [name] 08/23/2016, 2:21 PM

## 2016-08-23 NOTE — Progress Notes (Signed)
STROKE TEAM PROGRESS NOTE   SUBJECTIVE (INTERVAL HISTORY) Daughters at bedside. She is in bed. Can state her name. For TEE today. Daughters at bedside.   OBJECTIVE Temp:  [97.2 F (36.2 C)-98.8 F (37.1 C)] 98.7 F (37.1 C) (06/15 0500) Pulse Rate:  [56-102] 56 (06/15 0500) Cardiac Rhythm: Normal sinus rhythm (06/15 0700) Resp:  [16-18] 16 (06/15 0500) BP: (138-175)/(73-96) 152/75 (06/15 0500) SpO2:  [98 %-100 %] 99 % (06/15 0500)  CBC:  Recent Labs Lab 08/16/16 1713 08/20/16 1634  08/21/16 0448 08/23/16 0641  WBC 6.7 7.7  < > 7.5 6.0  NEUTROABS 3.7 4.4  --   --   --   HGB 11.4* 12.2  < > 11.1* 11.4*  HCT 35.7* 37.8  < > 35.1* 36.0  MCV 83.8 84.6  < > 84.8 84.9  PLT 272 264  < > 251 259  < > = values in this interval not displayed.  Basic Metabolic Panel:   Recent Labs Lab 08/21/16 0448 08/23/16 0641  NA 141 140  K 3.7 3.4*  CL 107 107  CO2 23 25  GLUCOSE 100* 94  BUN 18 15  CREATININE 1.20* 1.08*  CALCIUM 9.3 9.6    Lipid Panel:     Component Value Date/Time   CHOL 299 (H) 06/04/2016 0436   TRIG 217 (H) 06/04/2016 0436   HDL 34 (L) 06/04/2016 0436   CHOLHDL 8.8 06/04/2016 0436   VLDL 43 (H) 06/04/2016 0436   LDLCALC 222 (H) 06/04/2016 0436   HgbA1c:  Lab Results  Component Value Date   HGBA1C 6.4 (H) 06/03/2016   Urine Drug Screen:     Component Value Date/Time   LABOPIA NONE DETECTED 06/03/2016 0827   COCAINSCRNUR NONE DETECTED 06/03/2016 0827   LABBENZ NONE DETECTED 06/03/2016 0827   AMPHETMU NONE DETECTED 06/03/2016 0827   THCU NONE DETECTED 06/03/2016 0827   LABBARB NONE DETECTED 06/03/2016 0827    Alcohol Level     Component Value Date/Time   ETH <5 06/03/2016 0827    IMAGING  Ct Head Wo Contrast 08/20/2016 1. No new/acute intracranial findings are identified. 2. Hypodensities along the left corpus callosum corresponding to the sites of recent infarct shown on MRI from 08/17/2016. 3. Periventricular white matter and corona  radiata hypodensities favor chronic ischemic microvascular white matter disease. 4. 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 5. Minimal chronic left maxillary sinusitis.   Ct Angio Head W Or Wo Contrast Ct Angio Neck W Or Wo Contrast 08/21/2016 No extracranial stenosis of significance. Minor atheromatous change in both carotid bifurcations. Hypoplastic/atretic RIGHT A1 anterior cerebral. Dominant/sole contribution from the LEFT A1 ACA which supplies both anterior cerebral is distally. Occluded LEFT posterior cerebral artery in its distal P1 segment, as predicted from prior MRA. 50% stenosis RIGHT P1 PCA. Large thyroid goiter with substernal extension, approximate 6 x 6 x 9 cm. There is mass effect of both the trachea and the aortic arch. Surgical consultation is warranted.   Mr Brain Wo Contrast 08/20/2016 Newly seen small infarction in the right lateral thalamus/ posterior limb internal capsule. No significant change in the previously seen left ACA territory infarctions.    PHYSICAL EXAM Frail elderly african american patient. Afebrile. Head is nontraumatic. Neck is supple without bruit. Cardiac exam no murmur or gallop. Lungs are clear to auscultation. Distal pulses are well felt. Neurological Exam : Alert, paucity of speech, no aphasia possibly mild dysarthria, oriented to self, year, season not month. EOMI, PERRL.  Visual fields normal, face symmetric, palate elevates symmetrically, normal tongue, she has mild right hemiparesis, normal tone, absent LE DTRS, toes flexor, intact to LT  ASSESSMENT/PLAN Ms. ELLY HAFFEY is a 71 y.o. female with history of HTN, HLD, previous infarcts, CML and arthritis presenting with increasing weakness and difficulty with ambulation. She did not receive IV t-PA due to stroke within past 1 week.   Stroke:   Small R lateral thalamic/PLIC infarcts in setting of recurrent infarcts, concern for embolic source. May also consider  hypercoag disorder due to CML.  CT head no acute finding. recent infarct L corpus callosum. Small vessel disease.   CTA H&N no significant stenosis. Mild atheromatous ICA bifurcations. R A1 hypoplastic. Dominant L A1. Occluded L PCA. R P1 50%. Large thyroid goiter w/ trachea and aortic arch goiter.  MRI head small R lateral thalamic/PLIC infarct  2D Echo from March EF 60-56%. No source of embolus   TEE to look for embolic source. Arranged with [facility] for today.  If positive for PFO (patent foramen ovale), check bilateral lower extremity venous dopplers to rule out DVT as possible source of stroke. If negative for thrombus please evaluate for Loop.  If TEE negative, a [place] electrophysiologist will consult and consider placement of an implantable loop recorder to evaluate for atrial fibrillation as etiology of stroke. This has been explained to patient/family by Dr. Jaynee Eagles and they are agreeable.  LDL 222  HgbA1c 6.4  Lovenox 40 mg sq daily for VTE prophylaxis Diet NPO time specified Except for: Sips with Meds  aspirin 325 mg daily and clopidogrel 75 mg daily prior to admission, now on aspirin 325 mg daily. Would discharge on previous doses of ASA and Plavix.   Would recommend patient follow up with oncology/hematology to discuss possible anticoagulation for hypercoag disorder.   Patient counseled to be compliant with her antithrombotic medications  Ongoing aggressive stroke risk factor management  Therapy recommendations: SNF   Disposition:  pending   Hypertension  Stable, 150-170s  Permissive hypertension (OK if < 220/120) but gradually normalize in 5-7 days  Long-term BP goal normotensive  Hyperlipidemia  Home meds:  lipitor 40  resumed lipitor in hospital  LDL 222, goal < 70  Continue statin at discharge  Diabetes type II  HgbA1c 6.4, goal < 7.0  Controlled  Other Stroke Risk Factors  Advanced  age  Hx stroke  Family hx aneuysm (mother died from aneurysm at age 73)  Other Active Problems  Blood dyscrasia  CML, not in remission, on nilotinib  AKI  Hospital day # 2  Personally examined patient and images, and have participated in and made any corrections needed to history, physical, neuro exam,assessment and plan as stated above.  I have personally obtained the history, evaluated lab date, reviewed imaging studies and agree with radiology interpretations.   Patient to follow up with Dr. Erlinda Hong in 6-8 weeks  [name], MD Stroke Neurology     To contact Stroke Continuity provider, please refer to [email or web address] After hours, contact General Neurology

## 2016-08-23 NOTE — Consult Note (Signed)
ELECTROPHYSIOLOGY CONSULT NOTE  Patient ID: April Page MRN: [id number], DOB/AGE: 03/12/1945   Admit date: 08/20/2016 Date of Consult: 08/23/2016  Primary Physician: Redmond School, MD Primary Cardiologist: Dr. Debara Pickett (2015) Reason for Consultation: Cryptogenic stroke ; recommendations regarding Implantable Loop Recorder by Dr. Jaynee Eagles  History of Present Illness RUCHEL Page was admitted on 08/20/2016 with CVA.  They first developed symptoms while at home.  PMHx noted for HTN, HLD,prior strokes, CML, Imaging demonstrated Small R lateral thalamic/PLIC infarcts in setting of recurrent infarcts, concern for embolic source. May also consider hypercoag disorder due to CML.  she has undergone workup for stroke including echocardiogram and carotid dopplers.  The patient has been monitored on telemetry which has demonstrated sinus rhythm with no arrhythmias.  Inpatient stroke work-up is to be completed with a TEE.   Echocardiogram  06/04/16 TTE  Study Conclusions - Left ventricle: The cavity size was normal. Wall thickness was   increased in a pattern of moderate LVH. Systolic function was   normal. The estimated ejection fraction was in the range of 60%   to 65%. Doppler parameters are consistent with abnormal left   ventricular relaxation (grade 1 diastolic dysfunction). - Aortic valve: Mildly calcified annulus. Normal thickness   leaflets. Valve area (VTI): 2.61 cm^2. Valve area (Vmax): 2.18   cm^2. Valve area (Vmean): 2.46 cm^2. - Atrial septum: No defect or patent foramen ovale was identified. - Technically difficult study.   Lab work is reviewed.  Prior to admission, the patient denies chest pain, shortness of breath, dizziness, palpitations, or syncope.  They are recovering from their stroke with plans to SNF at discharge.  EP has been asked to evaluate for placement of an implantable loop recorder to monitor for atrial fibrillation.     Past Medical History:  Diagnosis  Date  . Arthritis    stiff knees  . Blood dyscrasia   . Cancer (April Page)   . CVA (cerebral vascular accident) (April Page)   . GERD (gastroesophageal reflux disease)   . H/O: CML (chronic myeloid leukemia)   . Hyperlipidemia   . Hyperlipidemia   . Hypertension   . Obesity   . Pessary maintenance 07/01/2013     Surgical History:  Past Surgical History:  Procedure Laterality Date  . ABDOMINAL HYSTERECTOMY    . CHOLECYSTECTOMY    . COLONOSCOPY  06/09/2002   [id number]:[id number] hemorrhoids; otherwise normal rectum, colon   . COLONOSCOPY N/A 08/25/2013   XFG:[id number] diverticulosis. Single colonic polyp-removed  s/p segmental biopsy and stool sample. random colon bx negative. +benign leiomyoma.  . cystocele/rectocele repair  2009  . ESOPHAGOGASTRODUODENOSCOPY  06/09/2002   JIR:CVELFY upper gastrointestinal tract s/p  54-French Maloney dilator  . FEMUR IM NAIL Right 04/12/2016   Procedure: INTRAMEDULLARY (IM) NAIL FEMORAL;  Surgeon: Nicholes Stairs, MD;  Location: [facility];  Service: Orthopedics;  Laterality: Right;  . HERNIA REPAIR    . OOPHORECTOMY       Prescriptions Prior to Admission  Medication Sig Dispense Refill Last Dose  . aspirin 325 MG tablet Take 1 tablet (325 mg total) by mouth daily. 30 tablet 4 08/20/2016 at 0900  . atorvastatin (LIPITOR) 40 MG tablet Take 1 tablet (40 mg total) by mouth daily at 6 PM. 30 tablet 0 08/19/2016 at pm  . clopidogrel (PLAVIX) 75 MG tablet Take 1 tablet (75 mg total) by mouth daily. 30 tablet 1 08/20/2016 at 0900  . losartan (COZAAR) 100 MG tablet Take 1 tablet (100 mg total) by  mouth daily. Resume on 3/28 (Patient taking differently: Take 100 mg by mouth daily. ) 30 tablet 0 08/20/2016 at am  . Multiple Vitamin (MULTIVITAMIN WITH MINERALS) TABS tablet Take 1 tablet by mouth daily.   08/20/2016 at Unknown time  . nilotinib (TASIGNA) 150 MG capsule Take 2 capsules (300 mg total) by mouth every 12 (twelve) hours. 120 capsule 6 08/20/2016 at 0900  . polyethylene  glycol (MIRALAX / GLYCOLAX) packet Take 17 g by mouth daily. 14 each 0 Past Week at Unknown time    Inpatient Medications:  . aspirin  300 mg Rectal Daily   Or  . aspirin  325 mg Oral Daily  . atorvastatin  40 mg Oral q1800  . enoxaparin (LOVENOX) injection  40 mg Subcutaneous QHS  . feeding supplement  1 Container Oral TID BM  . promethazine  12.5 mg Intravenous Once    Allergies: No Known Allergies  Social History   Social History  . Marital status: Widowed    Spouse name: N/A  . Number of children: 6  . Years of education: associate   Occupational History  .  Retired   Social History Main Topics  . Smoking status: Never Smoker  . Smokeless tobacco: Never Used  . Alcohol use No  . Drug use: No  . Sexual activity: Not Currently    Birth control/ protection: Surgical     Comment: hyst   Other Topics Concern  . Not on file   Social History Narrative  . No narrative on file     Family History  Problem Relation Age of Onset  . Anuerysm Mother        deceased age 19, brain anuerysm  . Early death Mother 87  . Heart disease Father   . Hypertension Sister   . Obesity Brother   . Hypertension Sister   . Arthritis Sister   . Other Son        cardiac arrest  . Heart disease Child 29       cardiac arrest  . Colon cancer Neg Hx       Review of Systems: All other systems reviewed and are otherwise negative except as noted above.  Physical Exam: Vitals:   08/23/16 1215 08/23/16 1224 08/23/16 1230 08/23/16 1240  BP: (!) 195/106 (!) 174/94 (!) 173/87 (!) 165/87  Pulse: 86 87 82 74  Resp: 20 18 16 15   Temp:   97.5 F (36.4 C)   TempSrc:   Oral   SpO2: 100% 99% 99% 100%  Weight:      Height:        GEN- The patient is well appearing, alert and oriented x 3 today.   Head- normocephalic, atraumatic Eyes-  Sclera clear, conjunctiva pink Ears- hearing intact Oropharynx- clear Neck- supple Lungs- CTA b/l, normal work of breathing Heart- RRR, no murmurs,  rubs or gallops  GI- soft, NT, ND Extremities- no clubbing, cyanosis, or edema MS- no significant deformity or atrophy Skin- no rash or lesion Psych- euthymic mood, full affect   Labs:   Lab Results  Component Value Date   WBC 6.0 08/23/2016   HGB 11.4 (L) 08/23/2016   HCT 36.0 08/23/2016   MCV 84.9 08/23/2016   PLT 259 08/23/2016    Recent Labs Lab 08/21/16 0448 08/23/16 0641  NA 141 140  K 3.7 3.4*  CL 107 107  CO2 23 25  BUN 18 15  CREATININE 1.20* 1.08*  CALCIUM 9.3 9.6  PROT 6.6  --  BILITOT 1.0  --   ALKPHOS 116  --   ALT 37  --   AST 39  --   GLUCOSE 100* 94   Lab Results  Component Value Date   CKTOTAL 197 04/11/2016   TROPONINI <0.03 06/03/2016   Lab Results  Component Value Date   CHOL 299 (H) 06/04/2016   Lab Results  Component Value Date   HDL 34 (L) 06/04/2016   Lab Results  Component Value Date   LDLCALC 222 (H) 06/04/2016   Lab Results  Component Value Date   TRIG 217 (H) 06/04/2016   Lab Results  Component Value Date   CHOLHDL 8.8 06/04/2016   No results found for: LDLDIRECT  No results found for: DDIMER    Radiology/Studies:  Ct Angio Head W Or Wo Contrast Result Date: 08/21/2016 CLINICAL DATA:  Acute multifocal infarcts. Worsening of weakness and ambulatory difficulty. EXAM: CT ANGIOGRAPHY HEAD AND NECK TECHNIQUE: Multidetector CT imaging of the head and neck was performed using the standard protocol during bolus administration of intravenous contrast. Multiplanar CT image reconstructions and MIPs were obtained to evaluate the vascular anatomy. Carotid stenosis measurements (when applicable) are obtained utilizing NASCET criteria, using the distal internal carotid diameter as the denominator. CONTRAST:  Isovue 370, 50 mL COMPARISON:  Multiple prior MR scans, most recent 08/20/2016. FINDINGS: CTA NECK Aortic arch: Conventional branching. Significant mass effect due to a large substernal goiter, with displacement of the trachea to  the RIGHT, and the arch anteriorly and to the LEFT. Imaged portion shows no evidence of aneurysm or dissection. No significant stenosis of the major arch vessel origins. Right carotid system: Minor atheromatous change. No evidence of dissection, stenosis (50% or greater) or occlusion. Left carotid system: Minor atheromatous change. No measurable stenosis. No evidence of dissection, or occlusion. Vertebral arteries: BILATERAL patent, LEFT dominant. Both vertebral arteries are stretched. No evidence of dissection, stenosis (50% or greater) or occlusion. Nonvascular soft tissues: Large thyroid goiter, approximately 6 x 6 x 9 cm, with significant mass effect on the superior mediastinal and lower LEFT neck structures. No lung apex lesion. Spondylosis. CTA HEAD Anterior circulation: Mild calcific atheromatous change of the skull-base cavernous internal carotid arteries. Hypoplastic/atretic RIGHT anterior cerebral artery A1 segment. Both anterior cerebral arteries are fed the LEFT. No MCA M1 or M2 stenosis of significance. No proximal occlusion, aneurysm, or vascular malformation. Posterior circulation: Basilar artery widely patent with LEFT vertebral dominant. Atheromatous change in the BILATERAL V4 segments without significant stenosis. 50% stenosis RIGHT P1 PCA. Occluded LEFT PCA in its distal P1 segment. There is slight reconstitution distally. This correlates with the observed pattern of pericallosal infarction involving the splenium. No aneurysm, or vascular malformation. Venous sinuses: As permitted by contrast timing, patent. Anatomic variants: None of significance. Delayed phase: No abnormal intracranial enhancement. Acute, subacute, and chronic infarctions as previously noted. Review of the MIP images confirms the above findings. IMPRESSION: No extracranial stenosis of significance. Minor atheromatous change in both carotid bifurcations. Hypoplastic/atretic RIGHT A1 anterior cerebral. Dominant/sole contribution  from the LEFT A1 ACA which supplies both anterior cerebral is distally. Occluded LEFT posterior cerebral artery in its distal P1 segment, as predicted from prior MRA. 50% stenosis RIGHT P1 PCA. Large thyroid goiter with substernal extension, approximate 6 x 6 x 9 cm. There is mass effect of both the trachea and the aortic arch. Surgical consultation is warranted. Electronically Signed   By: Staci Righter M.D.   On: 08/21/2016 13:26   Dg Chest 2 View  Result Date: 08/20/2016 CLINICAL DATA:  Right-sided chest pain after fall. Increased weakness and slurring of speech with lethargy. EXAM: CHEST  2 VIEW COMPARISON:  None. FINDINGS: AP semi upright view accentuates the cardiac silhouette and tortuosity of the thoracic aorta. There is aortic atherosclerosis at the arch as before. No pneumonic consolidation or pneumothorax. No effusion. No acute displaced rib fracture is noted. Chronic thoracic spondylosis of the dorsal spine. IMPRESSION: Cardiomegaly with aortic atherosclerosis. No acute pulmonary disease. No acute displaced rib fracture identified. Electronically Signed   By: Ashley Royalty M.D.   On: 08/20/2016 19:05   Ct Head Wo Contrast Result Date: 08/21/2016 CLINICAL DATA:  71 year old female with recent left ACA and right PCA territory infarcts. Worsening mental status. EXAM: CT HEAD WITHOUT CONTRAST TECHNIQUE: Contiguous axial images were obtained from the base of the skull through the vertex without intravenous contrast. COMPARISON:  CTA head and neck 1238 hours today, brain MRI and noncontrast head CT 08/20/2016 and earlier. FINDINGS: Brain: Distal left ACA territory infarct remains subtle by CT. The more recent right lateral thalamus/ right internal capsule lacune is also poorly visible by CT. No acute intracranial hemorrhage identified. No midline shift, mass effect, or evidence of intracranial mass lesion. Chronic right basal ganglia lacunar infarct. Stable gray-white matter differentiation since the CT  yesterday. No ventriculomegaly. Vascular: Calcified atherosclerosis at the skull base. Skull: No acute osseous abnormality identified. Sinuses/Orbits: Visualized paranasal sinuses and mastoids are stable and well pneumatized. Other: No acute orbit or scalp soft tissue findings. Trace retained secretions in the pharynx. IMPRESSION: 1. No new acute intracranial abnormality. Stable non contrast CT appearance of the brain since yesterday. 2. Left ACA and right thalamic infarcts remain poorly visible by CT. No associated hemorrhage or mass effect. Electronically Signed   By: Genevie Ann M.D.   On: 08/21/2016 18:44    Ct Head Wo Contrast Result Date: 08/16/2016 CLINICAL DATA:  71 year old female with slurred speech and weakness today. EXAM: CT HEAD WITHOUT CONTRAST TECHNIQUE: Contiguous axial images were obtained from the base of the skull through the vertex without intravenous contrast. COMPARISON:  06/03/2016 CT and MR and prior studies FINDINGS: Brain: No evidence of acute infarction, hemorrhage, hydrocephalus, extra-axial collection or mass lesion/mass effect. Mild chronic small-vessel white matter ischemic changes and remote right basal ganglia/ internal capsule infarct again noted. Vascular: Intracranial atherosclerotic calcifications noted. Skull: Normal. Negative for fracture or focal lesion. Sinuses/Orbits: No acute finding. Other: None IMPRESSION: No evidence of acute intracranial abnormality. Mild chronic small-vessel white matter ischemic changes and remote right basal ganglia/ internal capsule infarct. Electronically Signed   By: Margarette Canada M.D.   On: 08/16/2016 18:50    Mr Brain Wo Contrast Result Date: 08/20/2016 CLINICAL DATA:  Recent strokes. [place] yesterday going to the bathroom. Worsened weakness and balance disturbance. EXAM: MRI HEAD WITHOUT CONTRAST TECHNIQUE: Multiplanar, multiecho pulse sequences of the brain and surrounding structures were obtained without intravenous contrast. COMPARISON:   Head CT same day.  MRI 08/17/2016 FINDINGS: Brain: There is a newly seen small acute infarction in the right lateral thalamus/ posterior limb internal capsule. Previously seen infarctions in the left anterior cerebral artery territory appear the same. These are best compared in the coronal diffusion imaging. Elsewhere, there are old small vessel insults within the deep brain. No large vessel territory infarction elsewhere. No mass lesion, hemorrhage, hydrocephalus or extra-axial collection. Vascular: Major vessels at the base of the brain show flow. Skull and upper cervical spine: Negative Sinuses/Orbits: Clear/normal Other: None  significant IMPRESSION: Newly seen small infarction in the right lateral thalamus/ posterior limb internal capsule. No significant change in the previously seen left ACA territory infarctions. Electronically Signed   By: Nelson Chimes M.D.   On: 08/20/2016 20:38   Mr Brain Wo Contrast Result Date: 08/17/2016 CLINICAL DATA:  71 year old female with slurred speech in weakness. EXAM: MRI HEAD WITHOUT CONTRAST TECHNIQUE: Multiplanar, multiecho pulse sequences of the brain and surrounding structures were obtained without intravenous contrast. COMPARISON:  Head CT without contrast 08/16/2016. Brain MRI and intracranial MRA 06/03/2016. FINDINGS: Brain: Expected evolution of the diffusion restriction in the left splenium of the corpus callosum since March. New Patchy and confluent restricted diffusion along the left body of the corpus callosum and involving some of the adjacent left cingulate gyrus white matter (series 3, image 91). There is also occasional more superior subcortical anterior and posterior frontal white matter involvement (series 3, images 100 and 96). Associated T2 and FLAIR hyperintensity. No associated hemorrhage or mass effect. No contralateral right hemisphere or posterior fossa restricted diffusion. Chronic right corona radiata and basal ganglia lacunar infarcts. T2  heterogeneity in the other deep gray matter nuclei including both thalami suggesting chronic small vessel disease. Chronic microhemorrhage in the right caudate nucleus. Chronic Patchy T2 and FLAIR hyperintensity in the pons. No midline shift, mass effect, evidence of mass lesion, ventriculomegaly, extra-axial collection or acute intracranial hemorrhage. Cervicomedullary junction and pituitary are within normal limits. Vascular: Major intracranial vascular flow voids are stable since March. Skull and upper cervical spine: Negative. Visualized bone marrow signal is within normal limits. Sinuses/Orbits: Stable an negative. Other: Mastoids remain clear.  Negative scalp soft tissues. IMPRESSION: 1. Scattered acute small infarcts in the left hemisphere, primarily the left ACA territory. No associated hemorrhage or mass effect. 2. Expected evolution of the left splenium infarct since March, which could have been due to distal left ACA ischemia. 3. Underlying bilateral chronic small vessel ischemia especially in the deep gray matter nuclei. Electronically Signed   By: Genevie Ann M.D.   On: 08/17/2016 12:39    12-lead ECG SR All prior EKG's in EPIC reviewed with no documented atrial fibrillation  Telemetry SR  Assessment and Plan:  1. Cryptogenic stroke The patient presents with cryptogenic stroke.  The patient has a TEE planned for this AM.  Dr. Curt Bears spoke at length with the patient about monitoring for afib with either a 30 day event monitor or an implantable loop recorder.  Risks, benefits, and alteratives to implantable loop recorder were discussed with the patient today.   At this time, the patient is very clear in their decision to proceed with implantable loop recorder.   Wound care was reviewed with the patient (keep incision clean and dry for 3 days).  Wound check Dina Warbington be scheduled  for the patient.  Please call with questions.   Baldwin Jamaica, PA-C 08/23/2016   I have seen and examined this  patient with Tommye Standard.  Agree with above, note added to reflect my findings.  On exam, RRR, no murmurs, lungs clear. Presented with cryptogenic stroke. No cause has been found. Plan for TEE and if negative Ashaad Gaertner implant LINQ monitor. Risks and benefits discussed. Risks include but not limited to bleeding and infection. She understands the risks and has agreed to the procedure.    Narya Beavin M. Cashae Weich MD 08/23/2016 2:38 PM

## 2016-08-23 NOTE — H&P (View-Only) (Signed)
[facility] PROGRESS NOTE  April Page:[id number] DOB: [birth year] DOA: 08/20/2016 PCP: Redmond School, MD  Interim summary and HPI 71 y.o. female with history of stroke who was recently discharged on June 10 after being admitted for stroke was brought to the ER after patient's daughter found that patient was having increasing difficulty walking with increasing difficulty swallowing and also slurred speech. Found to have new stroke affecting her internal capsule and right thalamic region.  Assessment/Plan: 1-acute small right lateral thalamic and internal capsule infarcts -With concerns for embolic source, will follow TEE (see below; planned for tomorrow 6/15) -patient still with mild slurred speech; has now passed swallowing test, started on Dys 3 diet with thin liquids.  -After discussing with neurology plan is for TEE on 6/15; base on results patient might end up requiring lower extremity Doppler if she has a positive PFO or insertion of a loop recorder -LDL 222 -A1c 6.4 -Physical therapy/occupational therapy recommending SNF at this moment. -Patient improved overall -Will discuss with neurology regarding continuation of nilotinib  -Will continue aspirin for now  2-hypertension -Stable overall  -Will be permissive with hypertension in the setting of acute CVA  3-hyperlipidemia -Will resume statins   4-diabetes mellitus type 2 -Patient following diet control as an outpatient  -A1c 6.4 demonstrating good control  -For now will monitor CBGs  -Continue modified carbohydrate diet  -No need for sliding scale insulin coverage currently.  5-AKI -Renal function has improved by holding nephrotoxic agents and providing IV fluids. -Follow renal function trend.   6-CML -Continue outpatient follow-up with oncology -Not in remission -Was on nilotinib prior to admission; will most likely resume once workup for stroke completed.   Code Status: Full code Family  Communication: Daughter at bedside Disposition Plan: Patient may need skilled nursing facility when medically stable; will complete stroke workup and follow neurology recommendations. Planning TEE 6/15.   Consultants:  Neurology  Cardiology   Procedures:  See below for x-ray reports   CT head and NecK: without significant stenosis  TEE: planned for 6/15  Antibiotics:  None   HPI/Subjective: Patient is afebrile, denies chest pain, no shortness of breath. Patient is alert, oriented 3; still with mild slurred speech and feeling weak. Slow to response but appropriate.  Objective: Vitals:   08/22/16 1347 08/22/16 1752  BP: (!) 162/86 (!) 175/96  Pulse: 99 65  Resp: 18 18  Temp: 98.8 F (37.1 C) 97.2 F (36.2 C)    Intake/Output Summary (Last 24 hours) at 08/22/16 1832 Last data filed at 08/22/16 1300  Gross per 24 hour  Intake              595 ml  Output                3 ml  Net              592 ml   Filed Weights   08/20/16 1634 08/21/16 0000  Weight: 65.8 kg (145 lb) 64 kg (141 lb 1.6 oz)    Exam:   General: Afebrile, patient is alert, awake and oriented 3; slow to response but appropriate. Following commands and currently denying chest pain, shortness of breath, nausea, vomiting, abdominal pain or any other complaints. Patient is to have mild slurred speech overall but improved from presentation).   Cardiovascular: S1 and S2, no rubs, no gallops, no JVD   Respiratory: No wheezing, no crackles, no rales, good air movement bilaterally; no using accessory muscles.   Abdomen:  Soft, nontender, nondistended, positive bowel sounds.  Musculoskeletal: No edema, no cyanosis or clubbing.   Neurologic exam: Patient awake, alert and oriented 3; able to follow commands even slow to response. Cranial nerve grossly intact . Mild slurred speech appreciated. Muscle strength 3-4/5 bilaterally symmetrically upper and lower extremities.  Data Reviewed: Basic Metabolic  Panel:  Recent Labs Lab 08/16/16 1713 08/20/16 1634 08/20/16 1643 08/20/16 2248 08/21/16 0448  NA 140 141 141  --  141  K 3.6 4.2 4.2  --  3.7  CL 107 110 110  --  107  CO2 25 21*  --   --  23  GLUCOSE 119* 127* 132*  --  100*  BUN 13 24* 33*  --  18  CREATININE 1.02* 1.57* 1.50* 1.47* 1.20*  CALCIUM 9.6 9.6  --   --  9.3   Liver Function Tests:  Recent Labs Lab 08/20/16 1634 08/21/16 0448  AST 50* 39  ALT 37 37  ALKPHOS 132* 116  BILITOT 1.2 1.0  PROT 7.3 6.6  ALBUMIN 3.8 3.4*   CBC:  Recent Labs Lab 08/16/16 1713 08/20/16 1634 08/20/16 1643 08/20/16 2248 08/21/16 0448  WBC 6.7 7.7  --  8.4 7.5  NEUTROABS 3.7 4.4  --   --   --   HGB 11.4* 12.2 12.6 11.7* 11.1*  HCT 35.7* 37.8 37.0 36.8 35.1*  MCV 83.8 84.6  --  84.4 84.8  PLT 272 264  --  262 251   CBG:  Recent Labs Lab 08/17/16 1621 08/17/16 2151 08/18/16 0750 08/18/16 1140 08/20/16 1633  GLUCAP 148* 112* 110* 129* 131*    Recent Results (from the past 240 hour(s))  Urine culture     Status: Abnormal   Collection Time: 08/16/16  9:16 PM  Result Value Ref Range Status   Specimen Description URINE, CLEAN CATCH  Final   Special Requests NONE  Final   Culture MULTIPLE SPECIES PRESENT, SUGGEST RECOLLECTION (A)  Final   Report Status 08/18/2016 FINAL  Final     Studies: Ct Angio Head W Or Wo Contrast  Result Date: 08/21/2016 CLINICAL DATA:  Acute multifocal infarcts. Worsening of weakness and ambulatory difficulty. EXAM: CT ANGIOGRAPHY HEAD AND NECK TECHNIQUE: Multidetector CT imaging of the head and neck was performed using the standard protocol during bolus administration of intravenous contrast. Multiplanar CT image reconstructions and MIPs were obtained to evaluate the vascular anatomy. Carotid stenosis measurements (when applicable) are obtained utilizing NASCET criteria, using the distal internal carotid diameter as the denominator. CONTRAST:  Isovue 370, 50 mL COMPARISON:  Multiple prior MR  scans, most recent 08/20/2016. FINDINGS: CTA NECK Aortic arch: Conventional branching. Significant mass effect due to a large substernal goiter, with displacement of the trachea to the RIGHT, and the arch anteriorly and to the LEFT. Imaged portion shows no evidence of aneurysm or dissection. No significant stenosis of the major arch vessel origins. Right carotid system: Minor atheromatous change. No evidence of dissection, stenosis (50% or greater) or occlusion. Left carotid system: Minor atheromatous change. No measurable stenosis. No evidence of dissection, or occlusion. Vertebral arteries: BILATERAL patent, LEFT dominant. Both vertebral arteries are stretched. No evidence of dissection, stenosis (50% or greater) or occlusion. Nonvascular soft tissues: Large thyroid goiter, approximately 6 x 6 x 9 cm, with significant mass effect on the superior mediastinal and lower LEFT neck structures. No lung apex lesion. Spondylosis. CTA HEAD Anterior circulation: Mild calcific atheromatous change of the skull-base cavernous internal carotid arteries. Hypoplastic/atretic RIGHT anterior cerebral  artery A1 segment. Both anterior cerebral arteries are fed the LEFT. No MCA M1 or M2 stenosis of significance. No proximal occlusion, aneurysm, or vascular malformation. Posterior circulation: Basilar artery widely patent with LEFT vertebral dominant. Atheromatous change in the BILATERAL V4 segments without significant stenosis. 50% stenosis RIGHT P1 PCA. Occluded LEFT PCA in its distal P1 segment. There is slight reconstitution distally. This correlates with the observed pattern of pericallosal infarction involving the splenium. No aneurysm, or vascular malformation. Venous sinuses: As permitted by contrast timing, patent. Anatomic variants: None of significance. Delayed phase: No abnormal intracranial enhancement. Acute, subacute, and chronic infarctions as previously noted. Review of the MIP images confirms the above findings.  IMPRESSION: No extracranial stenosis of significance. Minor atheromatous change in both carotid bifurcations. Hypoplastic/atretic RIGHT A1 anterior cerebral. Dominant/sole contribution from the LEFT A1 ACA which supplies both anterior cerebral is distally. Occluded LEFT posterior cerebral artery in its distal P1 segment, as predicted from prior MRA. 50% stenosis RIGHT P1 PCA. Large thyroid goiter with substernal extension, approximate 6 x 6 x 9 cm. There is mass effect of both the trachea and the aortic arch. Surgical consultation is warranted. Electronically Signed   By: Staci Righter M.D.   On: 08/21/2016 13:26   Dg Chest 2 View  Result Date: 08/20/2016 CLINICAL DATA:  Right-sided chest pain after fall. Increased weakness and slurring of speech with lethargy. EXAM: CHEST  2 VIEW COMPARISON:  None. FINDINGS: AP semi upright view accentuates the cardiac silhouette and tortuosity of the thoracic aorta. There is aortic atherosclerosis at the arch as before. No pneumonic consolidation or pneumothorax. No effusion. No acute displaced rib fracture is noted. Chronic thoracic spondylosis of the dorsal spine. IMPRESSION: Cardiomegaly with aortic atherosclerosis. No acute pulmonary disease. No acute displaced rib fracture identified. Electronically Signed   By: Ashley Royalty M.D.   On: 08/20/2016 19:05   Ct Head Wo Contrast  Result Date: 08/21/2016 CLINICAL DATA:  71 year old female with recent left ACA and right PCA territory infarcts. Worsening mental status. EXAM: CT HEAD WITHOUT CONTRAST TECHNIQUE: Contiguous axial images were obtained from the base of the skull through the vertex without intravenous contrast. COMPARISON:  CTA head and neck 1238 hours today, brain MRI and noncontrast head CT 08/20/2016 and earlier. FINDINGS: Brain: Distal left ACA territory infarct remains subtle by CT. The more recent right lateral thalamus/ right internal capsule lacune is also poorly visible by CT. No acute intracranial  hemorrhage identified. No midline shift, mass effect, or evidence of intracranial mass lesion. Chronic right basal ganglia lacunar infarct. Stable gray-white matter differentiation since the CT yesterday. No ventriculomegaly. Vascular: Calcified atherosclerosis at the skull base. Skull: No acute osseous abnormality identified. Sinuses/Orbits: Visualized paranasal sinuses and mastoids are stable and well pneumatized. Other: No acute orbit or scalp soft tissue findings. Trace retained secretions in the pharynx. IMPRESSION: 1. No new acute intracranial abnormality. Stable non contrast CT appearance of the brain since yesterday. 2. Left ACA and right thalamic infarcts remain poorly visible by CT. No associated hemorrhage or mass effect. Electronically Signed   By: Genevie Ann M.D.   On: 08/21/2016 18:44   Ct Angio Neck W Or Wo Contrast  Result Date: 08/21/2016 CLINICAL DATA:  Acute multifocal infarcts. Worsening of weakness and ambulatory difficulty. EXAM: CT ANGIOGRAPHY HEAD AND NECK TECHNIQUE: Multidetector CT imaging of the head and neck was performed using the standard protocol during bolus administration of intravenous contrast. Multiplanar CT image reconstructions and MIPs were obtained to evaluate the  vascular anatomy. Carotid stenosis measurements (when applicable) are obtained utilizing NASCET criteria, using the distal internal carotid diameter as the denominator. CONTRAST:  Isovue 370, 50 mL COMPARISON:  Multiple prior MR scans, most recent 08/20/2016. FINDINGS: CTA NECK Aortic arch: Conventional branching. Significant mass effect due to a large substernal goiter, with displacement of the trachea to the RIGHT, and the arch anteriorly and to the LEFT. Imaged portion shows no evidence of aneurysm or dissection. No significant stenosis of the major arch vessel origins. Right carotid system: Minor atheromatous change. No evidence of dissection, stenosis (50% or greater) or occlusion. Left carotid system: Minor  atheromatous change. No measurable stenosis. No evidence of dissection, or occlusion. Vertebral arteries: BILATERAL patent, LEFT dominant. Both vertebral arteries are stretched. No evidence of dissection, stenosis (50% or greater) or occlusion. Nonvascular soft tissues: Large thyroid goiter, approximately 6 x 6 x 9 cm, with significant mass effect on the superior mediastinal and lower LEFT neck structures. No lung apex lesion. Spondylosis. CTA HEAD Anterior circulation: Mild calcific atheromatous change of the skull-base cavernous internal carotid arteries. Hypoplastic/atretic RIGHT anterior cerebral artery A1 segment. Both anterior cerebral arteries are fed the LEFT. No MCA M1 or M2 stenosis of significance. No proximal occlusion, aneurysm, or vascular malformation. Posterior circulation: Basilar artery widely patent with LEFT vertebral dominant. Atheromatous change in the BILATERAL V4 segments without significant stenosis. 50% stenosis RIGHT P1 PCA. Occluded LEFT PCA in its distal P1 segment. There is slight reconstitution distally. This correlates with the observed pattern of pericallosal infarction involving the splenium. No aneurysm, or vascular malformation. Venous sinuses: As permitted by contrast timing, patent. Anatomic variants: None of significance. Delayed phase: No abnormal intracranial enhancement. Acute, subacute, and chronic infarctions as previously noted. Review of the MIP images confirms the above findings. IMPRESSION: No extracranial stenosis of significance. Minor atheromatous change in both carotid bifurcations. Hypoplastic/atretic RIGHT A1 anterior cerebral. Dominant/sole contribution from the LEFT A1 ACA which supplies both anterior cerebral is distally. Occluded LEFT posterior cerebral artery in its distal P1 segment, as predicted from prior MRA. 50% stenosis RIGHT P1 PCA. Large thyroid goiter with substernal extension, approximate 6 x 6 x 9 cm. There is mass effect of both the trachea and  the aortic arch. Surgical consultation is warranted. Electronically Signed   By: Staci Righter M.D.   On: 08/21/2016 13:26   Mr Brain Wo Contrast  Result Date: 08/20/2016 CLINICAL DATA:  Recent strokes. [place] yesterday going to the bathroom. Worsened weakness and balance disturbance. EXAM: MRI HEAD WITHOUT CONTRAST TECHNIQUE: Multiplanar, multiecho pulse sequences of the brain and surrounding structures were obtained without intravenous contrast. COMPARISON:  Head CT same day.  MRI 08/17/2016 FINDINGS: Brain: There is a newly seen small acute infarction in the right lateral thalamus/ posterior limb internal capsule. Previously seen infarctions in the left anterior cerebral artery territory appear the same. These are best compared in the coronal diffusion imaging. Elsewhere, there are old small vessel insults within the deep brain. No large vessel territory infarction elsewhere. No mass lesion, hemorrhage, hydrocephalus or extra-axial collection. Vascular: Major vessels at the base of the brain show flow. Skull and upper cervical spine: Negative Sinuses/Orbits: Clear/normal Other: None significant IMPRESSION: Newly seen small infarction in the right lateral thalamus/ posterior limb internal capsule. No significant change in the previously seen left ACA territory infarctions. Electronically Signed   By: Nelson Chimes M.D.   On: 08/20/2016 20:38    Scheduled Meds: . aspirin  300 mg Rectal Daily   Or  .  aspirin  325 mg Oral Daily  . atorvastatin  40 mg Oral q1800  . enoxaparin (LOVENOX) injection  40 mg Subcutaneous QHS  . feeding supplement  1 Container Oral TID BM  . promethazine  12.5 mg Intravenous Once   Continuous Infusions:   Principal Problem:   Acute ischemic stroke ([place]) Active Problems:   Chronic myelogenous leukemia (CML), BCR-ABL1-positive ([place])   Essential hypertension   Stroke (cerebrum) ([place])   Stroke ([place])    Time spent: 25 minutes    Barton Dubois  [facility] Pager [phone]. If 7PM-7AM, please contact night-coverage at [email or web address], password [facility] 08/22/2016, 6:32 PM  LOS: 1 day

## 2016-08-23 NOTE — Progress Notes (Signed)
*  P Echocardiogram Echocardiogram Transesophageal has been performed.  April Page 08/23/2016, 12:28 PM

## 2016-08-23 NOTE — Progress Notes (Signed)
SLP Cancellation Note  Patient Details Name: April Page MRN: [id number] DOB: 1946-02-22   Cancelled treatment:       Reason Eval/Treat Not Completed: Medical issues which prohibited therapy - currently NPO pending TEE. Will continue efforts.   Germain Osgood 08/23/2016, 10:12 AM  Germain Osgood, M.A. CCC-SLP [phone]

## 2016-08-23 NOTE — Interval H&P Note (Signed)
History and Physical Interval Note:  08/23/2016 11:52 AM  April Page  has presented today for surgery, with the diagnosis of CVA  The various methods of treatment have been discussed with the patient and family. After consideration of risks, benefits and other options for treatment, the patient has consented to  Procedure(s): TRANSESOPHAGEAL ECHOCARDIOGRAM (TEE) (N/A) as a surgical intervention .  The patient's history has been reviewed, patient examined, no change in status, stable for surgery.  I have reviewed the patient's chart and labs.  Questions were answered to the patient's satisfaction.     Dorris Carnes

## 2016-08-23 NOTE — Interval H&P Note (Signed)
History and Physical Interval Note:  08/23/2016 11:15 AM  April Page  has presented today for surgery, with the diagnosis of CVA  The various methods of treatment have been discussed with the patient and family. After consideration of risks, benefits and other options for treatment, the patient has consented to  Procedure(s): TRANSESOPHAGEAL ECHOCARDIOGRAM (TEE) (N/A) as a surgical intervention .  The patient's history has been reviewed, patient examined, no change in status, stable for surgery.  I have reviewed the patient's chart and labs.  Questions were answered to the patient's satisfaction.     Dorris Carnes

## 2016-08-23 NOTE — Op Note (Signed)
LA, LAA without masses   Very small PFO as tested with injection of agitated saline with few bubbles seen in LA LVEF normal  LVH RVEF normal TV normal  MV normal  Trace MR AV normal PV normal NOrmal thoracic aorta

## 2016-08-24 ENCOUNTER — Encounter (HOSPITAL_COMMUNITY): Payer: Self-pay | Admitting: Internal Medicine

## 2016-08-24 DIAGNOSIS — I69891 Dysphagia following other cerebrovascular disease: Secondary | ICD-10-CM

## 2016-08-24 LAB — VITAMIN B12: Vitamin B-12: 1199 pg/mL — ABNORMAL HIGH (ref 180–914)

## 2016-08-24 NOTE — Progress Notes (Signed)
PT Cancellation Note  Patient Details Name: April Page MRN: [id number] DOB: 1945-10-11   Cancelled Treatment:    Reason Eval/Treat Not Completed: Medical issues which prohibited therapy. Pt with loop recorder placement yesterday, now blood has saturated dressing per RN. PT will continue to f/u with pt as appropriate.  Nikolai 08/24/2016, 10:13 AM

## 2016-08-24 NOTE — Progress Notes (Signed)
[facility] PROGRESS NOTE  April Page JOA:[id number] DOB: 09/18/1945 DOA: 08/20/2016 PCP: Redmond School, MD  Interim summary and HPI 71 y.o. female with history of stroke who was recently discharged on June 10 after being admitted for stroke was brought to the ER after patient's daughter found that patient was having increasing difficulty walking with increasing difficulty swallowing and also slurred speech. Found to have new stroke affecting her internal capsule and right thalamic region.  Assessment/Plan: 1-acute small right lateral thalamic and internal capsule infarcts -patient remains with slurred speech and with concerns for silent aspiration. Will pursuit MBS in am -TEE and loop recorder complete on 6/15 -will continue ASA and plavix for secondary prevention  -LDL 222; continue statins  -A1c 6.4 -patient condition overall stable. But slower most likely from anesthesia. Will need SNF at discharge -Will resume nilotinib at discharge; will need to discussed with oncology service about hypercoagulable state.   2-hypertension -Stable overall  -slowly resuming antihypertensive regimen   3-hyperlipidemia -continue statins    4-diabetes mellitus type 2 -Patient following diet control as an outpatient  -A1c 6.4 demonstrating good control  -will continue monitoring CBG's; patient has not required coverage currently  -continue modified carb diet    5-AKI -Follow renal function trend.  -currently stable and back to baseline after IVF's and holding nephrotoxic agents   6-CML -Continue outpatient follow-up with oncology -Not in remission -Was on nilotinib prior to admission; will most likely resume once workup for stroke completed.   Code Status: Full code Family Communication: Daughter at bedside Disposition Plan: Patient will need skilled nursing facility when medically stable; stroke work up essentially completed. S/p TEE and loop recorder insertion. Will follow MBS  and if stable discharge in am.    Consultants:  Neurology  Cardiology   Procedures:  See below for x-ray reports   CT head and NecK: without significant stenosis  TEE: planned for later today 6/15  Antibiotics:  None   HPI/Subjective: Afebrile, no CP, no SOB. Some bleeding appreciated from loop recorder insertion site and slower to respond (per family most likely from anesthesia   Objective: Vitals:   08/24/16 1000 08/24/16 1707  BP: (!) 151/84 (!) 182/89  Pulse: 78 82  Resp: 20 20  Temp: 99.1 F (37.3 C) 98.5 F (36.9 C)   No intake or output data in the 24 hours ending 08/24/16 1814 Filed Weights   08/20/16 1634 08/21/16 0000  Weight: 65.8 kg (145 lb) 64 kg (141 lb 1.6 oz)    Exam:   General: afebrile, no CP, no SOB. Patient slower to respond today and per SPL having some difficulty with following commands and with concerns for silent aspiration. Patient also with bleeding out of loop recorder insertion site. Oriented X3  Cardiovascular: S1 and S2, no rubs, no gallops, no mJVD  Respiratory: no wheezing, no crackles and overall good air movement.   Abdomen: soft, NT, ND, positive BS  Musculoskeletal: no edema, no cyanosis and no clubbing appreciated on exam.   Neurologic exam: except for being slower to response, overall neurologic exam is unchanged from yesterday. Remains AAOX3. CN grossly intact. Still with slurred speech and Muscle strength 3-4/5 bilaterally; normal bulk tone on exam.  Data Reviewed: Basic Metabolic Panel:  Recent Labs Lab 08/20/16 1634 08/20/16 1643 08/20/16 2248 08/21/16 0448 08/23/16 0641  NA 141 141  --  141 140  K 4.2 4.2  --  3.7 3.4*  CL 110 110  --  107 107  CO2 21*  --   --  23 25  GLUCOSE 127* 132*  --  100* 94  BUN 24* 33*  --  18 15  CREATININE 1.57* 1.50* 1.47* 1.20* 1.08*  CALCIUM 9.6  --   --  9.3 9.6   Liver Function Tests:  Recent Labs Lab 08/20/16 1634 08/21/16 0448  AST 50* 39  ALT 37 37  ALKPHOS  132* 116  BILITOT 1.2 1.0  PROT 7.3 6.6  ALBUMIN 3.8 3.4*   CBC:  Recent Labs Lab 08/20/16 1634 08/20/16 1643 08/20/16 2248 08/21/16 0448 08/23/16 0641  WBC 7.7  --  8.4 7.5 6.0  NEUTROABS 4.4  --   --   --   --   HGB 12.2 12.6 11.7* 11.1* 11.4*  HCT 37.8 37.0 36.8 35.1* 36.0  MCV 84.6  --  84.4 84.8 84.9  PLT 264  --  262 251 259   CBG:  Recent Labs Lab 08/17/16 2151 08/18/16 0750 08/18/16 1140 08/20/16 1633  GLUCAP 112* 110* 129* 131*    Recent Results (from the past 240 hour(s))  Urine culture     Status: Abnormal   Collection Time: 08/16/16  9:16 PM  Result Value Ref Range Status   Specimen Description URINE, CLEAN CATCH  Final   Special Requests NONE  Final   Culture MULTIPLE SPECIES PRESENT, SUGGEST RECOLLECTION (A)  Final   Report Status 08/18/2016 FINAL  Final     Studies: No results found.  Scheduled Meds: . aspirin  300 mg Rectal Daily   Or  . aspirin  325 mg Oral Daily  . atorvastatin  40 mg Oral q1800  . enoxaparin (LOVENOX) injection  40 mg Subcutaneous QHS  . feeding supplement  1 Container Oral TID BM  . promethazine  12.5 mg Intravenous Once   Continuous Infusions:   Principal Problem:   Acute ischemic stroke ([place]) Active Problems:   Chronic myelogenous leukemia (CML), BCR-ABL1-positive ([place])   Essential hypertension   Stroke (cerebrum) ([place])   Stroke ([place])    Time spent: 25 minutes    Barton Dubois  [facility] Pager [phone]. If 7PM-7AM, please contact night-coverage at [email or web address], password [facility] 08/24/2016, 6:14 PM  LOS: 3 days

## 2016-08-24 NOTE — Progress Notes (Signed)
  Speech Language Pathology Treatment: Dysphagia  Patient Details Name: April Page MRN: [id number] DOB: 18-Nov-1945 Today's Date: 08/24/2016 Time: 4627-0350 SLP Time Calculation (min) (ACUTE ONLY): 15 min  Assessment / Plan / Recommendation Clinical Impression  Patient seen for dysphagia treatment. Per MD on full liquids s/p EEG due to lethargy 2/2 anesthesia. F/u to reassess appropriateness for resuming prior diet of dysphagia 3, thin liquids. Pt is alert, following commands, daughter present at bedside. With sips of thin liquid via straw and pureed solids, pt with oral holding, anterior spillage and appearance of delayed swallow initiation ranging 10-20 seconds from time of bolus presentation. Following prolonged delay/oral holding, pt with immediate cough, suggestive of reduced airway protection. This appears inconsistent with her prior clinical presentation, spoke with MD who ?anesthesia from EEG vs neuro event. Pt noted to be pooling saliva in oral cavity. At this time pt is high risk for aspiration 2/2 reduced management of secretions, recommend ice chips only after oral care, necessary medications crushed in puree. SLP will f/u next date for improvements at bedside, MBS in the absence of functional improvements.    HPI HPI: April Pauls McCollumis a 71 y.o.femalewith history of stroke who was recently discharged on June 10 after being admitted for stroke was brought to the ER after patient's daughter found that patient was having increasing difficulty walking with increasing difficulty swallowing and also slurred speech. MRI of the brain shows new thalamic infarct extending to the posterior limb of the internal capsule. CXR Cardiomegaly with aortic atherosclerosis. No acute pulmonary disease. No acute displaced rib fracture identified. SLE at [facility] revealed a gradual decline since hip fracture in February and pt is not quite back to baseline.       SLP Plan  Continue with current plan of  care;MBS       Recommendations  Diet recommendations: NPO;Other(comment) (ice chips after oral care) Medication Administration: Crushed with puree                Oral Care Recommendations: Oral care BID Follow up Recommendations: Home health SLP;Outpatient SLP SLP Visit Diagnosis: Dysphagia, unspecified (R13.10) Plan: Continue with current plan of care;MBS       [place], [place], [place] Pathologist [place] 08/24/2016, 3:38 PM

## 2016-08-24 NOTE — Progress Notes (Addendum)
STROKE TEAM PROGRESS NOTE   SUBJECTIVE (INTERVAL HISTORY)   The patient reports that she's feeling well at this time. She is feeding herself with assistance of her daughter. The patient has had 3 ischemic events in the last month. She initially presented to the hospital in [place] with dysarthria. I did see the patient there and she did have a small infarct involving the corpus callosum associated with a stenosis of the PCA. Unfortunately she had another event associated with with it infarcts involving the left ACA distribution and more recently right thalamic region.   OBJECTIVE Temp:  [97.5 F (36.4 C)-98.7 F (37.1 C)] 98.2 F (36.8 C) (06/16 0625) Pulse Rate:  [59-92] 71 (06/16 0625) Cardiac Rhythm: Normal sinus rhythm (06/16 0700) Resp:  [15-20] 18 (06/16 0625) BP: (154-243)/(69-135) 154/80 (06/16 0625) SpO2:  [97 %-100 %] 97 % (06/16 0625)  CBC:  Recent Labs Lab 08/20/16 1634  08/21/16 0448 08/23/16 0641  WBC 7.7  < > 7.5 6.0  NEUTROABS 4.4  --   --   --   HGB 12.2  < > 11.1* 11.4*  HCT 37.8  < > 35.1* 36.0  MCV 84.6  < > 84.8 84.9  PLT 264  < > 251 259  < > = values in this interval not displayed.  Basic Metabolic Panel:   Recent Labs Lab 08/21/16 0448 08/23/16 0641  NA 141 140  K 3.7 3.4*  CL 107 107  CO2 23 25  GLUCOSE 100* 94  BUN 18 15  CREATININE 1.20* 1.08*  CALCIUM 9.3 9.6    Lipid Panel:     Component Value Date/Time   CHOL 299 (H) 06/04/2016 0436   TRIG 217 (H) 06/04/2016 0436   HDL 34 (L) 06/04/2016 0436   CHOLHDL 8.8 06/04/2016 0436   VLDL 43 (H) 06/04/2016 0436   LDLCALC 222 (H) 06/04/2016 0436   HgbA1c:  Lab Results  Component Value Date   HGBA1C 6.4 (H) 06/03/2016   Urine Drug Screen:     Component Value Date/Time   LABOPIA NONE DETECTED 06/03/2016 0827   COCAINSCRNUR NONE DETECTED 06/03/2016 0827   LABBENZ NONE DETECTED 06/03/2016 0827   AMPHETMU NONE DETECTED 06/03/2016 0827   THCU NONE DETECTED 06/03/2016 0827   LABBARB  NONE DETECTED 06/03/2016 0827    Alcohol Level     Component Value Date/Time   ETH <5 06/03/2016 0827    IMAGING  Ct Head Wo Contrast 08/20/2016 1. No new/acute intracranial findings are identified.  2. Hypodensities along the left corpus callosum corresponding to the sites of recent infarct shown on MRI from 08/17/2016.  3. Periventricular white matter and corona radiata hypodensities favor chronic ischemic microvascular white matter disease.  4. 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  5. Minimal chronic left maxillary sinusitis.    Ct Angio Head W Or Wo Contrast Ct Angio Neck W Or Wo Contrast 08/21/2016 No extracranial stenosis of significance. Minor atheromatous change in both carotid bifurcations. Hypoplastic/atretic RIGHT A1 anterior cerebral. Dominant/sole contribution from the LEFT A1 ACA which supplies both anterior cerebral is distally.  Occluded LEFT posterior cerebral artery in its distal P1 segment, as predicted from prior MRA.  50% stenosis RIGHT P1 PCA.  Large thyroid goiter with substernal extension, approximate 6 x 6 x 9 cm.  There is mass effect of both the trachea and the aortic arch. Surgical consultation is warranted.   Mr Brain Wo Contrast 08/20/2016 Newly seen small infarction in the right lateral thalamus/  posterior limb internal capsule.  No significant change in the previously seen left ACA territory infarctions.     PHYSICAL EXAM Frail elderly african american patient. Afebrile. Head is nontraumatic. Neck is supple without bruit. Cardiac exam no murmur or gallop.    Neurological Exam : Alert, paucity of speech, no aphasia possibly mild dysarthria, oriented to self, year, season not month. EOMI, PERRL. Visual fields normal, face symmetric, palate elevates symmetrically, normal tongue, she has mild right hemiparesis, normal tone  ASSESSMENT/PLAN Ms. MAKYNNA MANOCCHIO is a 71 y.o. female with history of HTN, HLD, previous  infarcts, CML and arthritis presenting with increasing weakness and difficulty with ambulation. She did not receive IV t-PA due to stroke within past 1 week. The patient has had multiple ischemic events in the last several weeks. This is despite a 5 that she is on dual antiplatelet agent. She does have underlying CML which may increase the risk of hypercoagulable state. She does have have a small PFO seen on TEE. A ultrasound DVT of the legs recommended. This is positive, think we should discuss with cardiology if closure of the PFO is warranted. Additional labs will also be obtained for RPR, vitamin B12 level and HIV.    Stroke:   Small R lateral thalamic/PLIC infarcts in setting of recurrent infarcts, concern for embolic source. May also consider hypercoag disorder due to CML.  CT head no acute finding. recent infarct L corpus callosum. Small vessel disease.   CTA H&N no significant stenosis. Mild atheromatous ICA bifurcations. R A1 hypoplastic. Dominant L A1. Occluded L PCA. R P1 50%. Large thyroid goiter w/ trachea and aortic arch goiter.  MRI head small R lateral thalamic/PLIC infarct  2D Echo from March EF 60-56%. No source of embolus   TEE - 08/23/2016 - Very small PFO as tested with injection of agitated saline with few bubbles seen in LA.   Check LE duplex.  Loop placed.  LDL 222  HgbA1c 6.4  Lovenox 40 mg sq daily for VTE prophylaxis Diet full liquid Room service appropriate? Yes; Fluid consistency: Thin  aspirin 325 mg daily and clopidogrel 75 mg daily prior to admission, now on aspirin 325 mg daily. Would discharge on previous doses of ASA and Plavix.   Would recommend patient follow up with oncology/hematology to discuss possible anticoagulation for hypercoag disorder.   Patient counseled to be compliant with her antithrombotic medications  Ongoing aggressive stroke risk factor management  Therapy recommendations: SNF   Disposition:  pending    Hypertension  Stable, 150-170s  Permissive hypertension (OK if < 220/120) but gradually normalize in 5-7 days  Long-term BP goal normotensive  Hyperlipidemia  Home meds:  lipitor 40  resumed lipitor in hospital  LDL 222, goal < 70  Continue statin at discharge  Diabetes type II  HgbA1c 6.4, goal < 7.0  Controlled  Other Stroke Risk Factors  Advanced age  Hx stroke  Family hx aneuysm (mother died from aneurysm at age 22)  Other Active Problems  Blood dyscrasia  CML, not in remission, on nilotinib  AKI  Small PFO on TEE -> check LE duplex - ordered.  Large thyroid goiter with substernal extension, approximate 6 x 6 x 9 cm. There is mass effect of both the trachea and the aortic arch. Surgical consultation is warranted per radiology.  Hospital day # 3  Personally examined patient and images, and have participated in and made any corrections needed to history, physical, neuro exam,assessment and plan as stated above.  I have personally obtained the history, evaluated lab date, reviewed imaging studies and agree with radiology interpretations.   Patient to follow up with Dr. Erlinda Hong in 6-8 weeks      To contact Stroke Continuity provider, please refer to [email or web address] After hours, contact General Neurology

## 2016-08-24 NOTE — Progress Notes (Signed)
Patient surgical site from loop recorder insertion, bleeding saturating original bandage.  Call to on call MD, given instructions to remove dressing, apply pressure for 5 minutes.  Bleeding from surgical site slowed then stopped. Dry dressing applied.  No noted saturation of blood at this time.  Patient is alert, verbally pleasant with no complaints.  Family at bedside and supportive.

## 2016-08-25 ENCOUNTER — Inpatient Hospital Stay (HOSPITAL_COMMUNITY): Payer: Medicare HMO

## 2016-08-25 ENCOUNTER — Encounter (HOSPITAL_COMMUNITY): Payer: Medicare HMO

## 2016-08-25 ENCOUNTER — Inpatient Hospital Stay
Admission: RE | Admit: 2016-08-25 | Discharge: 2016-09-25 | Disposition: A | Discharge status: Home / Self Care | Payer: Medicare HMO | Source: Ambulatory Visit | Attending: Internal Medicine | Admitting: Internal Medicine

## 2016-08-25 DIAGNOSIS — E785 Hyperlipidemia, unspecified: Secondary | ICD-10-CM | POA: Diagnosis not present

## 2016-08-25 DIAGNOSIS — R1310 Dysphagia, unspecified: Secondary | ICD-10-CM | POA: Diagnosis not present

## 2016-08-25 DIAGNOSIS — R5381 Other malaise: Secondary | ICD-10-CM

## 2016-08-25 DIAGNOSIS — N179 Acute kidney failure, unspecified: Secondary | ICD-10-CM | POA: Diagnosis not present

## 2016-08-25 DIAGNOSIS — K219 Gastro-esophageal reflux disease without esophagitis: Secondary | ICD-10-CM | POA: Diagnosis not present

## 2016-08-25 DIAGNOSIS — E119 Type 2 diabetes mellitus without complications: Secondary | ICD-10-CM | POA: Diagnosis not present

## 2016-08-25 DIAGNOSIS — E118 Type 2 diabetes mellitus with unspecified complications: Secondary | ICD-10-CM | POA: Diagnosis not present

## 2016-08-25 DIAGNOSIS — I639 Cerebral infarction, unspecified: Secondary | ICD-10-CM

## 2016-08-25 DIAGNOSIS — E1159 Type 2 diabetes mellitus with other circulatory complications: Secondary | ICD-10-CM | POA: Diagnosis not present

## 2016-08-25 DIAGNOSIS — I69391 Dysphagia following cerebral infarction: Secondary | ICD-10-CM | POA: Diagnosis not present

## 2016-08-25 DIAGNOSIS — Q211 Atrial septal defect: Secondary | ICD-10-CM | POA: Diagnosis not present

## 2016-08-25 DIAGNOSIS — M6281 Muscle weakness (generalized): Secondary | ICD-10-CM | POA: Diagnosis not present

## 2016-08-25 DIAGNOSIS — C9211 Chronic myeloid leukemia, BCR/ABL-positive, in remission: Secondary | ICD-10-CM | POA: Diagnosis not present

## 2016-08-25 DIAGNOSIS — R279 Unspecified lack of coordination: Secondary | ICD-10-CM | POA: Diagnosis not present

## 2016-08-25 DIAGNOSIS — I69891 Dysphagia following other cerebrovascular disease: Secondary | ICD-10-CM | POA: Diagnosis not present

## 2016-08-25 DIAGNOSIS — I10 Essential (primary) hypertension: Secondary | ICD-10-CM | POA: Diagnosis not present

## 2016-08-25 DIAGNOSIS — G464 Cerebellar stroke syndrome: Secondary | ICD-10-CM | POA: Diagnosis not present

## 2016-08-25 DIAGNOSIS — C9210 Chronic myeloid leukemia, BCR/ABL-positive, not having achieved remission: Secondary | ICD-10-CM | POA: Diagnosis not present

## 2016-08-25 DIAGNOSIS — K529 Noninfective gastroenteritis and colitis, unspecified: Secondary | ICD-10-CM | POA: Diagnosis not present

## 2016-08-25 DIAGNOSIS — D759 Disease of blood and blood-forming organs, unspecified: Secondary | ICD-10-CM | POA: Diagnosis not present

## 2016-08-25 DIAGNOSIS — R4781 Slurred speech: Secondary | ICD-10-CM | POA: Diagnosis not present

## 2016-08-25 LAB — BASIC METABOLIC PANEL
Anion gap: 9 (ref 5–15)
BUN: 14 mg/dL (ref 6–20)
CO2: 25 mmol/L (ref 22–32)
Calcium: 9.3 mg/dL (ref 8.9–10.3)
Chloride: 108 mmol/L (ref 101–111)
Creatinine, Ser: 0.98 mg/dL (ref 0.44–1.00)
GFR calc Af Amer: >60 mL/min (ref >60)
GFR, EST NON AFRICAN AMERICAN: 57 mL/min — AB (ref >60)
GLUCOSE: 96 mg/dL (ref 65–99)
POTASSIUM: 3.4 mmol/L — AB (ref 3.5–5.1)
Sodium: 142 mmol/L (ref 135–145)

## 2016-08-25 LAB — RPR: RPR Ser Ql: NONREACTIVE

## 2016-08-25 LAB — HIV ANTIBODY (ROUTINE TESTING W REFLEX): HIV SCREEN 4TH GENERATION: NONREACTIVE

## 2016-08-25 MED ORDER — ACETAMINOPHEN 325 MG PO TABS: 650.0000 mg | ORAL_TABLET | ORAL | Status: DC | PRN

## 2016-08-25 NOTE — Progress Notes (Signed)
Patient left unit by stretcher accompanied by [facility].

## 2016-08-25 NOTE — Progress Notes (Signed)
*  PRELIMINARY RESULTS* Vascular Ultrasound Lower extremity venous duplex has been completed.  Preliminary findings: No evidence of DVT or baker's cyst.  Landry Mellow, RDMS, RVT  08/25/2016, 11:02 AM

## 2016-08-25 NOTE — Progress Notes (Signed)
STROKE TEAM PROGRESS NOTE   SUBJECTIVE (INTERVAL HISTORY)   The patient reports that she's feeling well at this time. She has extensive family at bedside. The case is discussed at length with them.   OBJECTIVE Temp:  [98.3 F (36.8 C)-99.1 F (37.3 C)] 98.4 F (36.9 C) (06/17 0456) Pulse Rate:  [70-82] 74 (06/17 0456) Cardiac Rhythm: Sinus bradycardia (06/17 0700) Resp:  [18-20] 18 (06/17 0456) BP: (145-182)/(71-89) 145/71 (06/17 0456) SpO2:  [97 %-100 %] 97 % (06/17 0456)  CBC:  Recent Labs Lab 08/20/16 1634  08/21/16 0448 08/23/16 0641  WBC 7.7  < > 7.5 6.0  NEUTROABS 4.4  --   --   --   HGB 12.2  < > 11.1* 11.4*  HCT 37.8  < > 35.1* 36.0  MCV 84.6  < > 84.8 84.9  PLT 264  < > 251 259  < > = values in this interval not displayed.  Basic Metabolic Panel:   Recent Labs Lab 08/23/16 0641 08/25/16 0447  NA 140 142  K 3.4* 3.4*  CL 107 108  CO2 25 25  GLUCOSE 94 96  BUN 15 14  CREATININE 1.08* 0.98  CALCIUM 9.6 9.3    Lipid Panel:     Component Value Date/Time   CHOL 299 (H) 06/04/2016 0436   TRIG 217 (H) 06/04/2016 0436   HDL 34 (L) 06/04/2016 0436   CHOLHDL 8.8 06/04/2016 0436   VLDL 43 (H) 06/04/2016 0436   LDLCALC 222 (H) 06/04/2016 0436   HgbA1c:  Lab Results  Component Value Date   HGBA1C 6.4 (H) 06/03/2016   Urine Drug Screen:     Component Value Date/Time   LABOPIA NONE DETECTED 06/03/2016 0827   COCAINSCRNUR NONE DETECTED 06/03/2016 0827   LABBENZ NONE DETECTED 06/03/2016 0827   AMPHETMU NONE DETECTED 06/03/2016 0827   THCU NONE DETECTED 06/03/2016 0827   LABBARB NONE DETECTED 06/03/2016 0827    Alcohol Level     Component Value Date/Time   ETH <5 06/03/2016 0827    IMAGING  Ct Head Wo Contrast 08/20/2016 1. No new/acute intracranial findings are identified.  2. Hypodensities along the left corpus callosum corresponding to the sites of recent infarct shown on MRI from 08/17/2016.  3. Periventricular white matter and corona  radiata hypodensities favor chronic ischemic microvascular white matter disease.  4. 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  5. Minimal chronic left maxillary sinusitis.    Ct Angio Head W Or Wo Contrast Ct Angio Neck W Or Wo Contrast 08/21/2016 No extracranial stenosis of significance. Minor atheromatous change in both carotid bifurcations. Hypoplastic/atretic RIGHT A1 anterior cerebral. Dominant/sole contribution from the LEFT A1 ACA which supplies both anterior cerebral is distally.  Occluded LEFT posterior cerebral artery in its distal P1 segment, as predicted from prior MRA.  50% stenosis RIGHT P1 PCA.  Large thyroid goiter with substernal extension, approximate 6 x 6 x 9 cm.  There is mass effect of both the trachea and the aortic arch. Surgical consultation is warranted.   Mr Brain Wo Contrast 08/20/2016 Newly seen small infarction in the right lateral thalamus/ posterior limb internal capsule.  No significant change in the previously seen left ACA territory infarctions.   Modified barium swallow 08/25/2016 SLP Diet Recommendations Thin liquid;Dysphagia 2 (Fine chop) solids  Liquid Administration via Cup;Straw  Medication Administration Crushed with puree  Compensations Minimize environmental distractions;Slow rate;Small sips/bites;Other (Comment)      PHYSICAL EXAM Frail elderly african american patient.  Afebrile. Head is nontraumatic. Neck is supple without bruit. Cardiac exam no murmur or gallop.    Neurological Exam : Alert, paucity of speech, no aphasia possibly mild dysarthria, oriented to self, year, season not month. EOMI, PERRL. Visual fields normal, There is mild flattening of the nasolabial fold on the left, palate elevates symmetrically, normal tongue, she has mild right hemiparesis, normal tone  ASSESSMENT/PLAN Ms. QUYNH BASSO is a 71 y.o. female with history of HTN, HLD, previous infarcts, CML and arthritis presenting with  increasing weakness and difficulty with ambulation. She did not receive IV t-PA due to stroke within past 1 week. The patient has had multiple ischemic events in the last several weeks. This is despite a 5 that she is on dual antiplatelet agent. She does have underlying CML which may increase the risk of hypercoagulable state. She does have have a small PFO seen on TEE. A ultrasound DVT of the legs recommended. No DVTs shown on ultrasound. Unlikely this is the etiology of the patient's ischemic stroke.   Stroke:   Small R lateral thalamic/PLIC infarcts in setting of recurrent infarcts, concern for embolic source. May also consider hypercoag disorder due to CML.  CT head no acute finding. recent infarct L corpus callosum. Small vessel disease.   CTA H&N no significant stenosis. Mild atheromatous ICA bifurcations. R A1 hypoplastic. Dominant L A1. Occluded L PCA. R P1 50%. Large thyroid goiter w/ trachea and aortic arch goiter.  MRI head small R lateral thalamic/PLIC infarct  2D Echo from March EF 60-56%. No source of embolus   TEE - 08/23/2016 - Very small PFO as tested with injection of agitated saline with few bubbles seen in LA.   Check LE duplex - pending  Modified barium swallow today - diet advanced  Loop placed 08/23/2016  LDL 222  HgbA1c 6.4  Lovenox 40 mg sq daily for VTE prophylaxis DIET DYS 2 Room service appropriate? Yes; Fluid consistency: Thin  aspirin 325 mg daily and clopidogrel 75 mg daily prior to admission, now on aspirin 325 mg daily. Would discharge on previous doses of ASA and Plavix.   Would recommend patient follow up with oncology/hematology to discuss possible anticoagulation for hypercoag disorder.   Patient counseled to be compliant with her antithrombotic medications  Ongoing aggressive stroke risk factor management  Therapy recommendations: SNF   Disposition:  pending   Hypertension  Stable, 150-170s  Permissive hypertension (OK if < 220/120) but  gradually normalize in 5-7 days  Long-term BP goal normotensive  Hyperlipidemia  Home meds:  lipitor 40  resumed lipitor in hospital  LDL 222, goal < 70  Continue statin at discharge  Diabetes type II  HgbA1c 6.4, goal < 7.0  Controlled  Other Stroke Risk Factors  Advanced age  Hx stroke  Family hx aneuysm (mother died from aneurysm at age 34)  Other Active Problems  Blood dyscrasia  CML, not in remission, on nilotinib  AKI  Small PFO on TEE -> check LE duplex - ordered.  Large thyroid goiter with substernal extension, approximate 6 x 6 x 9 cm. There is mass effect of both the trachea and the aortic arch. Surgical consultation is warranted per radiology.  Hospital day # 4  Personally examined patient and images, and have participated in and made any corrections needed to history, physical, neuro exam,assessment and plan as stated above.  I have personally obtained the history, evaluated lab date, reviewed imaging studies and agree with radiology interpretations.   Patient to  follow up with Dr. Erlinda Hong in 6-8 weeks      To contact Stroke Continuity provider, please refer to [email or web address] After hours, contact General Neurology

## 2016-08-25 NOTE — Progress Notes (Addendum)
Modified Barium Swallow Progress Note  Patient Details  Name: April Page MRN: [id number] Date of Birth: 12/29/1945  Today's Date: 08/25/2016  Modified Barium Swallow completed.  Full report located under Chart Review in the Imaging Section.  Brief recommendations include the following:  Clinical Impression  Patient presents with moderate, cognitive/sensory-based oral dysphagia characterized by prolonged bolus holding and impacted by impaired sustained attention to PO. Pt's function appears improved with self-feeding and with preferred food items, though posterior transit/bolus propulsion remains significantly delayed. During the examination, pt required frequent verbal and tactile cues for attention/initiation of swallow sequence, with periods of oral holding >30 seconds. With regular solids, pt required extended time for mastication, multiple swallows to clear palatal/lingual residue. Despite prolonged oral phase, bolus containment in the oral cavity is adequate, with no premature spillage, timely swallow initiation at the base of tongue with adequate airway protection. Trace, flash penetration x1 with thin liquids, keeping within normal limits of pharyngeal phase. No aspiration observed. Pt remains at risk for aspiration given her cognitive impairment and significant oral deficits. Recommend pt receive f/u for oral dysphagia in next venue of care. Diet recommendation is finely chopped (dys 2) with thin liquids, with supervision to ensure adequate attention to PO and slow rate of intake (allowing up to 20 seconds for pt to swallow contents of oral cavity prior to presentation of subsequent boluses). Pt/family educated re: results and recommendations utilizing video review of the examination, verbalize understanding.   Swallow Evaluation Recommendations       SLP Diet Recommendations: Thin liquid;Dysphagia 2 (Fine chop) solids   Liquid Administration via: Cup;Straw   Medication  Administration: Crushed with puree   Supervision: Intermittent supervision to cue for compensatory strategies   Compensations: Minimize environmental distractions;Slow rate;Small sips/bites;Other (Comment) (tactile cues with spoon)       Oral Care Recommendations: Oral care BID      Deneise Lever, [place], [place] Speech-Language Pathologist [phone]  Aliene Altes 08/25/2016,9:40 AM

## 2016-08-25 NOTE — Progress Notes (Signed)
  Speech Language Pathology Treatment: Dysphagia  Patient Details Name: April Page MRN: [id number] DOB: [birth year] Today's Date: 08/25/2016 Time: 8115-7262 SLP Time Calculation (min) (ACUTE ONLY): 17 min  Assessment / Plan / Recommendation Clinical Impression  Patient seen for dysphagia f/u. Pt's daughter present, pt finishing breakfast tray. Pt continues with prolonged oral holding, delayed swallow initiation ranging from 10 to 20 seconds following presentation of bolus. Intermittent wet vocal quality, concerning for silent aspiration. Provided education to pt and her daughter, and daughter April Page via telephone re: swallow physiology and MBS to rule out aspiration, aid in determining safest, least restrictive diet and guiding rehabilitation planning. Recommend proceeding with MBS, to be performed shortly. Additional recommendations to follow instrumental study.     HPI HPI: April Deniston McCollumis a 71 y.o.femalewith history of stroke who was recently discharged on June 10 after being admitted for stroke was brought to the ER after patient's daughter found that patient was having increasing difficulty walking with increasing difficulty swallowing and also slurred speech. MRI of the brain shows new thalamic infarct extending to the posterior limb of the internal capsule. CXR Cardiomegaly with aortic atherosclerosis. No acute pulmonary disease. No acute displaced rib fracture identified. SLE at [facility] revealed a gradual decline since hip fracture in February and pt is not quite back to baseline.       SLP Plan  Continue with current plan of care;MBS       Recommendations                   Oral Care Recommendations: Oral care BID Follow up Recommendations: Skilled Nursing facility SLP Visit Diagnosis: Dysphagia, unspecified (R13.10) Plan: Continue with current plan of care;MBS       [place], [place], [place] Pathologist [phone]  April Page 08/25/2016, 8:37 AM

## 2016-08-25 NOTE — Clinical Social Work Placement (Signed)
   CLINICAL SOCIAL WORK PLACEMENT  NOTE  Date:  08/25/2016  Patient Details  Name: April Page MRN: [id number] Date of Birth: 1945/07/02  Clinical Social Work is seeking post-discharge placement for this patient at the [place] level of care (*CSW will initial, date and re-position this form in  chart as items are completed):  Yes   Patient/family provided with [place] Work Department's list of facilities offering this level of care within the geographic area requested by the patient (or if unable, by the patient's family).  Yes   Patient/family informed of their freedom to choose among providers that offer the needed level of care, that participate in Medicare, Medicaid or managed care program needed by the patient, have an available bed and are willing to accept the patient.  Yes   Patient/family informed of [place]'s ownership interest in [facility] and [facility], as well as of the fact that they are under no obligation to receive care at these facilities.  PASRR submitted to EDS on 08/23/16     PASRR number received on 08/23/16     Existing PASRR number confirmed on       FL2 transmitted to all facilities in geographic area requested by pt/family on 08/23/16     FL2 transmitted to all facilities within larger geographic area on       Patient informed that his/her managed care company has contracts with or will negotiate with certain facilities, including the following:        Yes   Patient/family informed of bed offers received.  Patient chooses bed at [facility]     Physician recommends and patient chooses bed at      Patient to be transferred to [facility] on 08/25/16.  Patient to be transferred to facility by Ambulance     Patient family notified on 08/25/16 of transfer.  Name of family member notified:  Patient daughter [facility]     PHYSICIAN       Additional Comment:   Barbette Or,  [place]

## 2016-08-25 NOTE — Clinical Social Work Note (Signed)
Clinical Social Worker facilitated patient discharge including contacting patient family and facility to confirm patient discharge plans.  Clinical information faxed to facility and family agreeable with plan.  CSW arranged ambulance transport via [facility] to [facility]  RN to call report prior to discharge.  Clinical Social Worker will sign off for now as social work intervention is no longer needed. Please consult [place] again if new need arises.  Barbette Or, [place]

## 2016-08-25 NOTE — Progress Notes (Signed)
Report given to [facility].

## 2016-08-25 NOTE — Discharge Summary (Signed)
Physician Discharge Summary  NITZIA Page GSU:[id number] DOB: [birth year] DOA: 08/20/2016  PCP: Redmond School, MD  Admit date: 08/20/2016 Discharge date: 08/25/2016  Time spent: 35 minutes  Recommendations for Outpatient Follow-up:  Dysphagia 2 diet with thin liquids Speech therapy, PT and OT as per SNF rehabilitation protocol  Maintain adequate hydration Repeat BMET in 5 days to follow electrolytes and renal function  Follow up with cardiology and neurology service as instructed.  Discharge Diagnoses:  Principal Problem:   Acute ischemic stroke [facility]) Active Problems:   Chronic myelogenous leukemia (CML), BCR-ABL1-positive ([place])   Essential hypertension   Stroke (cerebrum) (HCC)   Stroke [facility])   Physical deconditioning   Discharge Condition: stable and improved overall. Discharge to SNF for further care and rehabilitation. Follow up with neurology as an outpaitnet ([[[[  Diet recommendation: modified carb diet and heart healthy. Consistency diet changed to dysphagia 2 diet and thin liquids.    Filed Weights   08/20/16 1634 08/21/16 0000  Weight: 65.8 kg (145 lb) 64 kg (141 lb 1.6 oz)    Brief History of present illness:  71 y.o.femalewith history of stroke who was recently discharged on June 10 after being admitted for stroke was brought to the ER after patient's daughter found that patient was having increasing difficulty walking with increasing difficulty swallowing and also slurred speech. Found to have new stroke affecting her internal capsule and right thalamic region.  Hospital Course:  1-acute small right lateral thalamic and internal capsule infarcts -patient remains with slurred speech and with concerns for silent aspiration. MBS done and recommendations for dysphagia 2 with thin liquids provided. -TEE and loop recorder completed on 6/15 -will continue ASA and plavix for secondary prevention  -will follow up with neurology service  -LDL 222; continue statins   -A1c 6.4 -patient condition overall stable. Will need SNF at discharge for rehabilitation (PT/OT and SPL) -Will resume nilotinib at discharge; will need to discussed with oncology service about hypercoagulable state at follow up visit.   2-hypertension -Stable overall  -will resume home antihypertensive regimen   3-hyperlipidemia -continue statins    4-diabetes mellitus type 2 -Patient following diet control as an outpatient  -A1c 6.4 demonstrating good control  -continue modified carb diet    5-AKI -currently stable and back to baseline after IVF's and holding nephrotoxic agents  -repeat BMET in 5 days to follow electrolytes and Cr trend   6-CML -Continue outpatient follow-up with oncology -Not in remission -Will resume nilotinib prior to admission  7-small PFO -seen on TEE; no LE DVT's and or SVT's  Procedures:  See below for x-ray reports   CT head and NecK: without significant stenosis  TEE: preserved EF; no wall motion abnormalities, small PFO  LE duplex: no DVT, SVT's or baker's cyst   Insertion loop recorder   Consultations:  Neurology  Cardiology   Discharge Exam: Vitals:   08/25/16 0456 08/25/16 1000  BP: (!) 145/71 (!) 155/90  Pulse: 74 77  Resp: 18   Temp: 98.4 F (36.9 C) 99 F (37.2 C)    General: afebrile, no CP, no SOB. Patient feeling ok and oriented X3. No nausea and no vomiting. Completed MBS w/o problems; Dysphagia 2 diet recommended given concerns for silent aspiration.  Cardiovascular: S1 and S2, no rubs, no gallops, no mJVD  Respiratory: no wheezing, no crackles and overall good air movement.   Abdomen: soft, NT, ND, positive BS  Musculoskeletal: no edema, no cyanosis and no clubbing appreciated on exam.  Neurologic exam: except for being slower to response, overall neurologic exam is unchanged from yesterday. Remains AAOX3. CN grossly intact. Still with slurred speech and Muscle strength 3-4/5 bilaterally; normal bulk  tone on exam.   Discharge Instructions   Discharge Instructions    Ambulatory referral to Neurology    Complete by:  As directed    An appointment is requested in approximately: 6-8 weeks. Dr. Jaynee Eagles is requesting follow up with dr. Erlinda Hong. Thanks.   Diet - low sodium heart healthy    Complete by:  As directed    Discharge instructions    Complete by:  As directed    Dysphagia 2 diet with thin liquids Speech therapy, PT and OT as per SNF rehabilitation protocol  Maintain adequate hydration  Repeat BMET in 5 days to follow electrolytes and renal function  Follow up with cardiology and neurology service as instructed.     Current Discharge Medication List    START taking these medications   Details  acetaminophen (TYLENOL) 325 MG tablet Take 2 tablets (650 mg total) by mouth every 4 (four) hours as needed for mild pain (or temp > 37.5 C (99.5 F)).      CONTINUE these medications which have NOT CHANGED   Details  aspirin 325 MG tablet Take 1 tablet (325 mg total) by mouth daily. Qty: 30 tablet, Refills: 4    atorvastatin (LIPITOR) 40 MG tablet Take 1 tablet (40 mg total) by mouth daily at 6 PM. Qty: 30 tablet, Refills: 0    clopidogrel (PLAVIX) 75 MG tablet Take 1 tablet (75 mg total) by mouth daily. Qty: 30 tablet, Refills: 1    losartan (COZAAR) 100 MG tablet Take 1 tablet (100 mg total) by mouth daily. Resume on 3/28 Qty: 30 tablet, Refills: 0    Multiple Vitamin (MULTIVITAMIN WITH MINERALS) TABS tablet Take 1 tablet by mouth daily.    nilotinib (TASIGNA) 150 MG capsule Take 2 capsules (300 mg total) by mouth every 12 (twelve) hours. Qty: 120 capsule, Refills: 6   Associated Diagnoses: Chronic myeloid leukemia, BCR/ABL-positive, not having achieved remission ([place]); High risk medication use    polyethylene glycol (MIRALAX / GLYCOLAX) packet Take 17 g by mouth daily. Qty: 14 each, Refills: 0       No Known Allergies   The results of significant diagnostics from this  hospitalization (including imaging, microbiology, ancillary and laboratory) are listed below for reference.    Significant Diagnostic Studies: Ct Angio Head W Or Wo Contrast  Result Date: 08/21/2016 CLINICAL DATA:  Acute multifocal infarcts. Worsening of weakness and ambulatory difficulty. EXAM: CT ANGIOGRAPHY HEAD AND NECK TECHNIQUE: Multidetector CT imaging of the head and neck was performed using the standard protocol during bolus administration of intravenous contrast. Multiplanar CT image reconstructions and MIPs were obtained to evaluate the vascular anatomy. Carotid stenosis measurements (when applicable) are obtained utilizing NASCET criteria, using the distal internal carotid diameter as the denominator. CONTRAST:  Isovue 370, 50 mL COMPARISON:  Multiple prior MR scans, most recent 08/20/2016. FINDINGS: CTA NECK Aortic arch: Conventional branching. Significant mass effect due to a large substernal goiter, with displacement of the trachea to the RIGHT, and the arch anteriorly and to the LEFT. Imaged portion shows no evidence of aneurysm or dissection. No significant stenosis of the major arch vessel origins. Right carotid system: Minor atheromatous change. No evidence of dissection, stenosis (50% or greater) or occlusion. Left carotid system: Minor atheromatous change. No measurable stenosis. No evidence of dissection, or occlusion.  Vertebral arteries: BILATERAL patent, LEFT dominant. Both vertebral arteries are stretched. No evidence of dissection, stenosis (50% or greater) or occlusion. Nonvascular soft tissues: Large thyroid goiter, approximately 6 x 6 x 9 cm, with significant mass effect on the superior mediastinal and lower LEFT neck structures. No lung apex lesion. Spondylosis. CTA HEAD Anterior circulation: Mild calcific atheromatous change of the skull-base cavernous internal carotid arteries. Hypoplastic/atretic RIGHT anterior cerebral artery A1 segment. Both anterior cerebral arteries are fed  the LEFT. No MCA M1 or M2 stenosis of significance. No proximal occlusion, aneurysm, or vascular malformation. Posterior circulation: Basilar artery widely patent with LEFT vertebral dominant. Atheromatous change in the BILATERAL V4 segments without significant stenosis. 50% stenosis RIGHT P1 PCA. Occluded LEFT PCA in its distal P1 segment. There is slight reconstitution distally. This correlates with the observed pattern of pericallosal infarction involving the splenium. No aneurysm, or vascular malformation. Venous sinuses: As permitted by contrast timing, patent. Anatomic variants: None of significance. Delayed phase: No abnormal intracranial enhancement. Acute, subacute, and chronic infarctions as previously noted. Review of the MIP images confirms the above findings. IMPRESSION: No extracranial stenosis of significance. Minor atheromatous change in both carotid bifurcations. Hypoplastic/atretic RIGHT A1 anterior cerebral. Dominant/sole contribution from the LEFT A1 ACA which supplies both anterior cerebral is distally. Occluded LEFT posterior cerebral artery in its distal P1 segment, as predicted from prior MRA. 50% stenosis RIGHT P1 PCA. Large thyroid goiter with substernal extension, approximate 6 x 6 x 9 cm. There is mass effect of both the trachea and the aortic arch. Surgical consultation is warranted. Electronically Signed   By: Staci Righter M.D.   On: 08/21/2016 13:26   Dg Chest 2 View  Result Date: 08/20/2016 CLINICAL DATA:  Right-sided chest pain after fall. Increased weakness and slurring of speech with lethargy. EXAM: CHEST  2 VIEW COMPARISON:  None. FINDINGS: AP semi upright view accentuates the cardiac silhouette and tortuosity of the thoracic aorta. There is aortic atherosclerosis at the arch as before. No pneumonic consolidation or pneumothorax. No effusion. No acute displaced rib fracture is noted. Chronic thoracic spondylosis of the dorsal spine. IMPRESSION: Cardiomegaly with aortic  atherosclerosis. No acute pulmonary disease. No acute displaced rib fracture identified. Electronically Signed   By: Ashley Royalty M.D.   On: 08/20/2016 19:05   Ct Head Wo Contrast  Result Date: 08/21/2016 CLINICAL DATA:  71 year old female with recent left ACA and right PCA territory infarcts. Worsening mental status. EXAM: CT HEAD WITHOUT CONTRAST TECHNIQUE: Contiguous axial images were obtained from the base of the skull through the vertex without intravenous contrast. COMPARISON:  CTA head and neck 1238 hours today, brain MRI and noncontrast head CT 08/20/2016 and earlier. FINDINGS: Brain: Distal left ACA territory infarct remains subtle by CT. The more recent right lateral thalamus/ right internal capsule lacune is also poorly visible by CT. No acute intracranial hemorrhage identified. No midline shift, mass effect, or evidence of intracranial mass lesion. Chronic right basal ganglia lacunar infarct. Stable gray-white matter differentiation since the CT yesterday. No ventriculomegaly. Vascular: Calcified atherosclerosis at the skull base. Skull: No acute osseous abnormality identified. Sinuses/Orbits: Visualized paranasal sinuses and mastoids are stable and well pneumatized. Other: No acute orbit or scalp soft tissue findings. Trace retained secretions in the pharynx. IMPRESSION: 1. No new acute intracranial abnormality. Stable non contrast CT appearance of the brain since yesterday. 2. Left ACA and right thalamic infarcts remain poorly visible by CT. No associated hemorrhage or mass effect. Electronically Signed   By: Lemmie Evens  [place] Crane M.D.   On: 08/21/2016 18:44   Ct Head Wo Contrast  Result Date: 08/20/2016 CLINICAL DATA:  Fall and weakness. Recent discharge for stroke. Residual slurred speech. EXAM: CT HEAD WITHOUT CONTRAST TECHNIQUE: Contiguous axial images were obtained from the base of the skull through the vertex without intravenous contrast. COMPARISON:  08/17/2016 MRI FINDINGS: Brain: Mild hypodensities  along the left corpus callosum for example on image 18/3 reflect the known subacute infarcts in this vicinity. Small remote infarct in the anterior limb right internal capsule/ adjacent caudate head, image 13/3, chronic. Periventricular white matter and corona radiata hypodensities favor chronic ischemic microvascular white matter disease. Otherwise, the brainstem, cerebellum, cerebral peduncles, thalami, basal ganglia, basilar cisterns, and ventricular system appear within normal limits. No new stroke, mass lesion, or intracranial hemorrhage identified. Vascular: There is atherosclerotic calcification of the cavernous carotid arteries bilaterally. Skull: Osteoma along the outer table of the left frontoparietal skull. This appears stable and has benign imaging characteristics. Sinuses/Orbits: Minimal chronic left maxillary sinusitis. Other: No supplemental non-categorized findings. IMPRESSION: 1. No new/acute intracranial findings are identified. 2. Hypodensities along the left corpus callosum corresponding to the sites of recent infarct shown on MRI from 08/17/2016. 3. Periventricular white matter and corona radiata hypodensities favor chronic ischemic microvascular white matter disease. 4. 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 5. Minimal chronic left maxillary sinusitis. Electronically Signed   By: Van Clines M.D.   On: 08/20/2016 17:43   Ct Head Wo Contrast  Result Date: 08/16/2016 CLINICAL DATA:  71 year old female with slurred speech and weakness today. EXAM: CT HEAD WITHOUT CONTRAST TECHNIQUE: Contiguous axial images were obtained from the base of the skull through the vertex without intravenous contrast. COMPARISON:  06/03/2016 CT and MR and prior studies FINDINGS: Brain: No evidence of acute infarction, hemorrhage, hydrocephalus, extra-axial collection or mass lesion/mass effect. Mild chronic small-vessel white matter ischemic changes and remote right basal ganglia/  internal capsule infarct again noted. Vascular: Intracranial atherosclerotic calcifications noted. Skull: Normal. Negative for fracture or focal lesion. Sinuses/Orbits: No acute finding. Other: None IMPRESSION: No evidence of acute intracranial abnormality. Mild chronic small-vessel white matter ischemic changes and remote right basal ganglia/ internal capsule infarct. Electronically Signed   By: Margarette Canada M.D.   On: 08/16/2016 18:50   Ct Angio Neck W Or Wo Contrast  Result Date: 08/21/2016 CLINICAL DATA:  Acute multifocal infarcts. Worsening of weakness and ambulatory difficulty. EXAM: CT ANGIOGRAPHY HEAD AND NECK TECHNIQUE: Multidetector CT imaging of the head and neck was performed using the standard protocol during bolus administration of intravenous contrast. Multiplanar CT image reconstructions and MIPs were obtained to evaluate the vascular anatomy. Carotid stenosis measurements (when applicable) are obtained utilizing NASCET criteria, using the distal internal carotid diameter as the denominator. CONTRAST:  Isovue 370, 50 mL COMPARISON:  Multiple prior MR scans, most recent 08/20/2016. FINDINGS: CTA NECK Aortic arch: Conventional branching. Significant mass effect due to a large substernal goiter, with displacement of the trachea to the RIGHT, and the arch anteriorly and to the LEFT. Imaged portion shows no evidence of aneurysm or dissection. No significant stenosis of the major arch vessel origins. Right carotid system: Minor atheromatous change. No evidence of dissection, stenosis (50% or greater) or occlusion. Left carotid system: Minor atheromatous change. No measurable stenosis. No evidence of dissection, or occlusion. Vertebral arteries: BILATERAL patent, LEFT dominant. Both vertebral arteries are stretched. No evidence of dissection, stenosis (50% or greater) or occlusion. Nonvascular soft tissues: Large thyroid goiter, approximately  6 x 6 x 9 cm, with significant mass effect on the superior  mediastinal and lower LEFT neck structures. No lung apex lesion. Spondylosis. CTA HEAD Anterior circulation: Mild calcific atheromatous change of the skull-base cavernous internal carotid arteries. Hypoplastic/atretic RIGHT anterior cerebral artery A1 segment. Both anterior cerebral arteries are fed the LEFT. No MCA M1 or M2 stenosis of significance. No proximal occlusion, aneurysm, or vascular malformation. Posterior circulation: Basilar artery widely patent with LEFT vertebral dominant. Atheromatous change in the BILATERAL V4 segments without significant stenosis. 50% stenosis RIGHT P1 PCA. Occluded LEFT PCA in its distal P1 segment. There is slight reconstitution distally. This correlates with the observed pattern of pericallosal infarction involving the splenium. No aneurysm, or vascular malformation. Venous sinuses: As permitted by contrast timing, patent. Anatomic variants: None of significance. Delayed phase: No abnormal intracranial enhancement. Acute, subacute, and chronic infarctions as previously noted. Review of the MIP images confirms the above findings. IMPRESSION: No extracranial stenosis of significance. Minor atheromatous change in both carotid bifurcations. Hypoplastic/atretic RIGHT A1 anterior cerebral. Dominant/sole contribution from the LEFT A1 ACA which supplies both anterior cerebral is distally. Occluded LEFT posterior cerebral artery in its distal P1 segment, as predicted from prior MRA. 50% stenosis RIGHT P1 PCA. Large thyroid goiter with substernal extension, approximate 6 x 6 x 9 cm. There is mass effect of both the trachea and the aortic arch. Surgical consultation is warranted. Electronically Signed   By: Staci Righter M.D.   On: 08/21/2016 13:26   Mr Brain Wo Contrast  Result Date: 08/20/2016 CLINICAL DATA:  Recent strokes. [place] yesterday going to the bathroom. Worsened weakness and balance disturbance. EXAM: MRI HEAD WITHOUT CONTRAST TECHNIQUE: Multiplanar, multiecho pulse  sequences of the brain and surrounding structures were obtained without intravenous contrast. COMPARISON:  Head CT same day.  MRI 08/17/2016 FINDINGS: Brain: There is a newly seen small acute infarction in the right lateral thalamus/ posterior limb internal capsule. Previously seen infarctions in the left anterior cerebral artery territory appear the same. These are best compared in the coronal diffusion imaging. Elsewhere, there are old small vessel insults within the deep brain. No large vessel territory infarction elsewhere. No mass lesion, hemorrhage, hydrocephalus or extra-axial collection. Vascular: Major vessels at the base of the brain show flow. Skull and upper cervical spine: Negative Sinuses/Orbits: Clear/normal Other: None significant IMPRESSION: Newly seen small infarction in the right lateral thalamus/ posterior limb internal capsule. No significant change in the previously seen left ACA territory infarctions. Electronically Signed   By: Nelson Chimes M.D.   On: 08/20/2016 20:38   Mr Brain Wo Contrast  Result Date: 08/17/2016 CLINICAL DATA:  70 year old female with slurred speech in weakness. EXAM: MRI HEAD WITHOUT CONTRAST TECHNIQUE: Multiplanar, multiecho pulse sequences of the brain and surrounding structures were obtained without intravenous contrast. COMPARISON:  Head CT without contrast 08/16/2016. Brain MRI and intracranial MRA 06/03/2016. FINDINGS: Brain: Expected evolution of the diffusion restriction in the left splenium of the corpus callosum since March. New Patchy and confluent restricted diffusion along the left body of the corpus callosum and involving some of the adjacent left cingulate gyrus white matter (series 3, image 91). There is also occasional more superior subcortical anterior and posterior frontal white matter involvement (series 3, images 100 and 96). Associated T2 and FLAIR hyperintensity. No associated hemorrhage or mass effect. No contralateral right hemisphere or  posterior fossa restricted diffusion. Chronic right corona radiata and basal ganglia lacunar infarcts. T2 heterogeneity in the other deep gray matter nuclei  including both thalami suggesting chronic small vessel disease. Chronic microhemorrhage in the right caudate nucleus. Chronic Patchy T2 and FLAIR hyperintensity in the pons. No midline shift, mass effect, evidence of mass lesion, ventriculomegaly, extra-axial collection or acute intracranial hemorrhage. Cervicomedullary junction and pituitary are within normal limits. Vascular: Major intracranial vascular flow voids are stable since March. Skull and upper cervical spine: Negative. Visualized bone marrow signal is within normal limits. Sinuses/Orbits: Stable an negative. Other: Mastoids remain clear.  Negative scalp soft tissues. IMPRESSION: 1. Scattered acute small infarcts in the left hemisphere, primarily the left ACA territory. No associated hemorrhage or mass effect. 2. Expected evolution of the left splenium infarct since March, which could have been due to distal left ACA ischemia. 3. Underlying bilateral chronic small vessel ischemia especially in the deep gray matter nuclei. Electronically Signed   By: Genevie Ann M.D.   On: 08/17/2016 12:39   Dg Swallowing Func-speech Pathology  Result Date: 08/25/2016 Objective Swallowing Evaluation: Type of Study: MBS-Modified Barium Swallow Study Patient Details Name: April Page MRN: [id number] Date of Birth: 1946/02/22 Today's Date: 08/25/2016 Time: SLP Start Time (ACUTE ONLY): 0900-SLP Stop Time (ACUTE ONLY): 0920 SLP Time Calculation (min) (ACUTE ONLY): 20 min Past Medical History: Past Medical History: Diagnosis Date . Arthritis   stiff knees . Blood dyscrasia  . Cancer ([place])  . CVA (cerebral vascular accident) ([place])  . GERD (gastroesophageal reflux disease)  . H/O: CML (chronic myeloid leukemia)  . Hyperlipidemia  . Hyperlipidemia  . Hypertension  . Obesity  . Pessary maintenance 07/01/2013 Past Surgical  History: Past Surgical History: Procedure Laterality Date . ABDOMINAL HYSTERECTOMY   . CHOLECYSTECTOMY   . COLONOSCOPY  06/09/2002  [facility]:FYBOFBPZ hemorrhoids; otherwise normal rectum, colon  . COLONOSCOPY N/A 08/25/2013  [facility]:ENIDPOE diverticulosis. Single colonic polyp-removed  s/p segmental biopsy and stool sample. random colon bx negative. +benign leiomyoma. . cystocele/rectocele repair  2009 . ESOPHAGOGASTRODUODENOSCOPY  06/09/2002  [facility]:NTIRWE upper gastrointestinal tract s/p  54-French Maloney dilator . FEMUR IM NAIL Right 04/12/2016  Procedure: INTRAMEDULLARY (IM) NAIL FEMORAL;  Surgeon: Nicholes Stairs, MD;  Location: [place];  Service: Orthopedics;  Laterality: Right; . HERNIA REPAIR   . OOPHORECTOMY   . TEE WITHOUT CARDIOVERSION N/A 08/23/2016  Procedure: TRANSESOPHAGEAL ECHOCARDIOGRAM (TEE);  Surgeon: Fay Records, MD;  Location: [place];  Service: Cardiovascular;  Laterality: N/A; HPI: Porschea Borys McCollumis a 71 y.o.femalewith history of stroke who was recently discharged on June 10 after being admitted for stroke was brought to the ER after patient's daughter found that patient was having increasing difficulty walking with increasing difficulty swallowing and also slurred speech. MRI of the brain shows new thalamic infarct extending to the posterior limb of the internal capsule. CXR Cardiomegaly with aortic atherosclerosis. No acute pulmonary disease. No acute displaced rib fracture identified. [facility] revealed a gradual decline since hip fracture in February and pt is not quite back to baseline.  Subjective: Pt arrives for MBS with daughter Assessment / Plan / Recommendation CHL IP CLINICAL IMPRESSIONS 08/25/2016 Clinical Impression Patient presents with moderate, cognitive/sensory-based oral dysphagia characterized by prolonged bolus holding and impacted by impaired sustained attention to PO. Pt's function appears improved with self-feeding and with preferred food items, though posterior  transit/bolus propulsion remains significantly delayed. During the examination, pt required frequent verbal and tactile cues for attention/initiation of swallow sequence, with periods of oral holding >30 seconds. With regular solids, pt required extended time for mastication, multiple swallows to clear palatal/lingual residue. Despite  prolonged oral phase, bolus containment in the oral cavity is adequate, with no premature spillage, timely swallow initiation at the base of tongue with adequate airway protection. Trace, flash penetration x1 with thin liquids, keeping within normal limits of pharyngeal phase. No aspiration observed. Pt remains at risk for aspiration given her cognitive impairment and significant oral deficits. Recommend pt receive f/u for oral dysphagia in next venue of care. Diet recommendation is finely chopped (dys 2) with thin liquids, with supervision to ensure adequate attention to PO and slow rate of intake (allowing up to 20 seconds for pt to swallow contents of oral cavity prior to presentation of subsequent boluses).  SLP Visit Diagnosis Dysphagia, oral phase (R13.11) Attention and concentration deficit following -- Frontal lobe and executive function deficit following -- Impact on safety and function Mild aspiration risk;Moderate aspiration risk   CHL IP TREATMENT RECOMMENDATION 08/25/2016 Treatment Recommendations Defer treatment plan to f/u with SLP   Prognosis 08/25/2016 Prognosis for Safe Diet Advancement Fair Barriers to Reach Goals Cognitive deficits Barriers/Prognosis Comment -- CHL IP DIET RECOMMENDATION 08/25/2016 SLP Diet Recommendations Thin liquid;Dysphagia 2 (Fine chop) solids Liquid Administration via Cup;Straw Medication Administration Crushed with puree Compensations Minimize environmental distractions;Slow rate;Small sips/bites;Other (Comment) Postural Changes --   CHL IP OTHER RECOMMENDATIONS 08/25/2016 Recommended Consults -- Oral Care Recommendations Oral care BID Other  Recommendations --   CHL IP FOLLOW UP RECOMMENDATIONS 08/25/2016 Follow up Recommendations Skilled Nursing facility   [facility] IP FREQUENCY AND DURATION 08/21/2016 Speech Therapy Frequency (ACUTE ONLY) min 2x/week Treatment Duration --      CHL IP ORAL PHASE 08/25/2016 Oral Phase Impaired Oral - Pudding Teaspoon -- Oral - Pudding Cup -- Oral - Honey Teaspoon -- Oral - Honey Cup -- Oral - Nectar Teaspoon -- Oral - Nectar Cup -- Oral - Nectar Straw -- Oral - Thin Teaspoon Holding of bolus;Reduced posterior propulsion;Weak lingual manipulation;Delayed oral transit Oral - Thin Cup -- Oral - Thin Straw Weak lingual manipulation;Reduced posterior propulsion;Holding of bolus;Delayed oral transit Oral - Puree Weak lingual manipulation;Holding of bolus;Delayed oral transit;Reduced posterior propulsion Oral - Mech Soft -- Oral - Regular Impaired mastication;Lingual/palatal residue;Piecemeal swallowing;Delayed oral transit;Weak lingual manipulation;Reduced posterior propulsion;Holding of bolus Oral - Multi-Consistency Holding of bolus;Weak lingual manipulation Oral - Pill -- Oral Phase - Comment --   CHL IP CERVICAL ESOPHAGEAL PHASE 08/25/2016 Cervical Esophageal Phase WFL                                 CHL IP GO 08/21/2016 Functional Assessment Tool Used skilled clinical observation Functional Limitations Memory Swallow Current Status (T2671) CK Swallow Goal Status (I4580) Bayard Swallow Discharge Status [phone]) (None)                                           Deneise Lever, MS, CCC-SLP Speech-Language Pathologist [phone] Aliene Altes 08/25/2016, 9:41 AM               Microbiology: Recent Results (from the past 240 hour(s))  Urine culture     Status: Abnormal   Collection Time: 08/16/16  9:16 PM  Result Value Ref Range Status   Specimen Description URINE, CLEAN CATCH  Final   Special Requests NONE  Final   Culture MULTIPLE SPECIES PRESENT, SUGGEST RECOLLECTION (A)  Final   Report Status 08/18/2016 FINAL  Final      Labs: Basic Metabolic Panel:  Recent Labs Lab 08/20/16 1634 08/20/16 1643 08/20/16 2248 08/21/16 0448 08/23/16 0641 08/25/16 0447  NA 141 141  --  141 140 142  K 4.2 4.2  --  3.7 3.4* 3.4*  CL 110 110  --  107 107 108  CO2 21*  --   --  23 25 25   GLUCOSE 127* 132*  --  100* 94 96  BUN 24* 33*  --  18 15 14   CREATININE 1.57* 1.50* 1.47* 1.20* 1.08* 0.98  CALCIUM 9.6  --   --  9.3 9.6 9.3   Liver Function Tests:  Recent Labs Lab 08/20/16 1634 08/21/16 0448  AST 50* 39  ALT 37 37  ALKPHOS 132* 116  BILITOT 1.2 1.0  PROT 7.3 6.6  ALBUMIN 3.8 3.4*   CBC:  Recent Labs Lab 08/20/16 1634 08/20/16 1643 08/20/16 2248 08/21/16 0448 08/23/16 0641  WBC 7.7  --  8.4 7.5 6.0  NEUTROABS 4.4  --   --   --   --   HGB 12.2 12.6 11.7* 11.1* 11.4*  HCT 37.8 37.0 36.8 35.1* 36.0  MCV 84.6  --  84.4 84.8 84.9  PLT 264  --  262 251 259   CBG:  Recent Labs Lab 08/18/16 1140 08/20/16 1633  GLUCAP 129* 131*    Signed:  Barton Dubois MD.  [facility] 08/25/2016, 11:12 AM

## 2016-08-26 ENCOUNTER — Encounter (HOSPITAL_COMMUNITY)
Admission: RE | Admit: 2016-08-26 | Discharge: 2016-08-26 | Disposition: A | Discharge status: Home / Self Care | Payer: Medicare HMO | Source: Ambulatory Visit | Attending: Adult Health | Admitting: Adult Health

## 2016-08-26 ENCOUNTER — Encounter (HOSPITAL_COMMUNITY): Payer: Self-pay | Admitting: Cardiology

## 2016-08-26 ENCOUNTER — Non-Acute Institutional Stay (SKILLED_NURSING_FACILITY): Payer: Medicare HMO | Admitting: Internal Medicine

## 2016-08-26 DIAGNOSIS — E785 Hyperlipidemia, unspecified: Secondary | ICD-10-CM

## 2016-08-26 DIAGNOSIS — I639 Cerebral infarction, unspecified: Secondary | ICD-10-CM

## 2016-08-26 DIAGNOSIS — I10 Essential (primary) hypertension: Secondary | ICD-10-CM | POA: Diagnosis not present

## 2016-08-26 DIAGNOSIS — E118 Type 2 diabetes mellitus with unspecified complications: Secondary | ICD-10-CM | POA: Diagnosis not present

## 2016-08-26 DIAGNOSIS — C9210 Chronic myeloid leukemia, BCR/ABL-positive, not having achieved remission: Secondary | ICD-10-CM

## 2016-08-26 DIAGNOSIS — I69391 Dysphagia following cerebral infarction: Secondary | ICD-10-CM

## 2016-08-26 DIAGNOSIS — K219 Gastro-esophageal reflux disease without esophagitis: Secondary | ICD-10-CM

## 2016-08-26 DIAGNOSIS — C9211 Chronic myeloid leukemia, BCR/ABL-positive, in remission: Secondary | ICD-10-CM | POA: Insufficient documentation

## 2016-08-26 LAB — CBC WITH DIFFERENTIAL/PLATELET
Basophils Absolute: 0 10*3/uL (ref 0.0–0.1)
Basophils Relative: 1 %
EOS ABS: 0.1 10*3/uL (ref 0.0–0.7)
Eosinophils Relative: 2 %
HCT: 34.8 % — ABNORMAL LOW (ref 36.0–46.0)
HEMOGLOBIN: 11.4 g/dL — AB (ref 12.0–15.0)
LYMPHS ABS: 2.2 10*3/uL (ref 0.7–4.0)
LYMPHS PCT: 36 %
MCH: 27.4 pg (ref 26.0–34.0)
MCHC: 32.8 g/dL (ref 30.0–36.0)
MCV: 83.7 fL (ref 78.0–100.0)
MONOS PCT: 9 %
Monocytes Absolute: 0.5 10*3/uL (ref 0.1–1.0)
NEUTROS ABS: 3.2 10*3/uL (ref 1.7–7.7)
Neutrophils Relative %: 52 %
Platelets: 253 10*3/uL (ref 150–400)
RBC: 4.16 MIL/uL (ref 3.87–5.11)
RDW: 16.3 % — ABNORMAL HIGH (ref 11.5–15.5)
WBC: 6.1 10*3/uL (ref 4.0–10.5)

## 2016-08-26 LAB — BASIC METABOLIC PANEL
ANION GAP: 7 (ref 5–15)
BUN: 16 mg/dL (ref 6–20)
CO2: 26 mmol/L (ref 22–32)
Calcium: 9.5 mg/dL (ref 8.9–10.3)
Chloride: 109 mmol/L (ref 101–111)
Creatinine, Ser: 1 mg/dL (ref 0.44–1.00)
GFR calc non Af Amer: 55 mL/min — ABNORMAL LOW (ref >60)
GLUCOSE: 101 mg/dL — AB (ref 65–99)
Potassium: 3.5 mmol/L (ref 3.5–5.1)
Sodium: 142 mmol/L (ref 135–145)

## 2016-08-26 LAB — HOMOCYSTEINE: HOMOCYSTEINE-NORM: 17.9 umol/L — AB (ref 0.0–15.0)

## 2016-08-26 NOTE — Progress Notes (Signed)
Provider: Veleta Miners  Location:   [place] Room Number: 105/D Place of Service:  SNF (31)  PCP: Redmond School, MD Patient Care Team: Redmond School, MD as PCP - General (Internal Medicine) Gala Romney, Cristopher Estimable, MD as Consulting Physician (Gastroenterology)  Extended Emergency Contact Information Primary Emergency Contact: [facility] Address: [address]          [place], [place] [address] [place]te L[place] of [place] Phone: [phone] Relation: Daughter Secondary Emergency Contact: Blackwell,Michelle Address: [place], [place] [address] [place] Phone: [phone] Relation: Daughter  Code Status: Full Code Goals of Care: Advanced Directive information Advanced Directives 08/26/2016  Does Patient Have a Medical Advance Directive? Yes  Type of Advance Directive (No Data)  Does patient want to make changes to medical advance directive? No - Patient declined  Would patient like information on creating a medical advance directive? No - Patient declined      Chief Complaint  Patient presents with  . New Admit To SNF    HPI: Patient is a 71 y.o. female seen today for admission to SNF for therapy.  Patient has h/o Diabetes Mellitus, Hypertension, Hyperlipidemia, h/o CML in remission, right Femoral Fracture in 02/18,  Recurrent CVA, Dysphagia.   Patient had initial Stroke in 03/18 with small infarct involving the corpus callosum associated with a stenosis of the PCA. She  had small Infarct in Left ACA territory in 08/16/16.   This Admission She came back in hospital with Difficulty walking and Swallowing and repeat MRI showed New Thalamic Infarct. Her work up included TEE which Showed Small PFO. No Extra Cranial Stenosis. No LE DVT Loop recorder was placed to monitor rhythm. She is On Nilotinib For her CML and Family wants to hold it as they think it is causing to have patient these strokes. Patient did not have  any complains. Her daughter said that they would be able to take her home when she is independent in her ADL. She lives by herself and has family support.    Past Medical History:  Diagnosis Date  . Arthritis    stiff knees  . Blood dyscrasia   . Cancer ([place])   . CVA (cerebral vascular accident) ([place])   . GERD (gastroesophageal reflux disease)   . H/O: CML (chronic myeloid leukemia)   . Hyperlipidemia   . Hyperlipidemia   . Hypertension   . Obesity   . Pessary maintenance 07/01/2013   Past Surgical History:  Procedure Laterality Date  . ABDOMINAL HYSTERECTOMY    . CHOLECYSTECTOMY    . COLONOSCOPY  06/09/2002   ZSW:FUXNATFT hemorrhoids; otherwise normal rectum, colon   . COLONOSCOPY N/A 08/25/2013   DDU:KGURKYH diverticulosis. Single colonic polyp-removed  s/p segmental biopsy and stool sample. random colon bx negative. +benign leiomyoma.  . cystocele/rectocele repair  2009  . ESOPHAGOGASTRODUODENOSCOPY  06/09/2002   CWC:BJSEGB upper gastrointestinal tract s/p  54-French Maloney dilator  . FEMUR IM NAIL Right 04/12/2016   Procedure: INTRAMEDULLARY (IM) NAIL FEMORAL;  Surgeon: Nicholes Stairs, MD;  Location: [facility] [place];  Service: Orthopedics;  Laterality: Right;  . HERNIA REPAIR    . LOOP RECORDER INSERTION N/A 08/23/2016   Procedure: Loop Recorder Insertion;  Surgeon: Constance Haw, MD;  Location: [facility];  Service: Cardiovascular;  Laterality: N/A;  . OOPHORECTOMY    . TEE WITHOUT CARDIOVERSION N/A 08/23/2016   Procedure: TRANSESOPHAGEAL ECHOCARDIOGRAM (TEE);  Surgeon: Dorris Carnes  V, MD;  Location: [facility];  Service: Cardiovascular;  Laterality: N/A;    reports that she has never smoked. She has never used smokeless tobacco. She reports that she does not drink alcohol or use drugs. Social History   Social History  . Marital status: Widowed    Spouse name: N/A  . Number of children: 6  . Years of education: associate   Occupational History  .  Retired     Social History Main Topics  . Smoking status: Never Smoker  . Smokeless tobacco: Never Used  . Alcohol use No  . Drug use: No  . Sexual activity: Not Currently    Birth control/ protection: Surgical     Comment: hyst   Other Topics Concern  . Not on file   Social History Narrative  . No narrative on file    Functional Status Survey:    Family History  Problem Relation Age of Onset  . Anuerysm Mother        deceased age 62, brain anuerysm  . Early death Mother 21  . Heart disease Father   . Hypertension Sister   . Obesity Brother   . Hypertension Sister   . Arthritis Sister   . Other Son        cardiac arrest  . Heart disease Child 30       cardiac arrest  . Colon cancer Neg Hx     Health Maintenance  Topic Date Due  . Hepatitis C Screening  11-Oct-1945  . FOOT EXAM  04/05/1955  . OPHTHALMOLOGY EXAM  04/05/1955  . TETANUS/TDAP  04/04/1964  . PNA vac Low Risk Adult (1 of 2 - PCV13) 04/04/2010  . INFLUENZA VACCINE  10/09/2016  . HEMOGLOBIN A1C  12/04/2016  . MAMMOGRAM  05/31/2017  . COLONOSCOPY  08/26/2023  . DEXA SCAN  Completed    No Known Allergies  Allergies as of 08/26/2016   No Known Allergies     Medication List       Accurate as of 08/26/16 12:37 PM. Always use your most recent med list.          acetaminophen 325 MG tablet Commonly known as:  TYLENOL Take 2 tablets (650 mg total) by mouth every 4 (four) hours as needed for mild pain (or temp > 37.5 C (99.5 F)).   aspirin 325 MG tablet Take 1 tablet (325 mg total) by mouth daily.   atorvastatin 40 MG tablet Commonly known as:  LIPITOR Take 1 tablet (40 mg total) by mouth daily at 6 PM.   clopidogrel 75 MG tablet Commonly known as:  PLAVIX Take 1 tablet (75 mg total) by mouth daily.   losartan 100 MG tablet Commonly known as:  COZAAR Take 1 tablet (100 mg total) by mouth daily. Resume on 3/28   multivitamin with minerals Tabs tablet Take 1 tablet by mouth daily.   nilotinib  150 MG capsule Commonly known as:  TASIGNA Take 2 capsules (300 mg total) by mouth every 12 (twelve) hours.   polyethylene glycol packet Commonly known as:  MIRALAX / GLYCOLAX Take 17 g by mouth daily.       Review of Systems  Review of Systems  Constitutional: Negative for activity change, appetite change, chills, diaphoresis, fatigue and fever.  HENT: Negative for mouth sores, postnasal drip, rhinorrhea, sinus pain and sore throat.   Respiratory: Negative for apnea, cough, chest tightness, shortness of breath and wheezing.   Cardiovascular: Negative for chest pain, palpitations and  leg swelling.  Gastrointestinal: Negative for abdominal distention, abdominal pain, constipation, diarrhea, nausea and vomiting.  Genitourinary: Negative for dysuria and frequency.  Musculoskeletal: Negative for arthralgias, joint swelling and myalgias.  Skin: Negative for rash.  Neurological: Negative for dizziness, syncope, weakness, light-headedness and numbness.  Psychiatric/Behavioral: Negative for behavioral problems, confusion and sleep disturbance.     Vitals:   08/26/16 1234  BP: (!) 149/90  Pulse: 86  Resp: 20  Temp: 98.5 F (36.9 C)  TempSrc: Oral  SpO2: 100%   There is no height or weight on file to calculate BMI. Physical Exam  Constitutional: She is oriented to person, place, and time. She appears well-developed and well-nourished.  HENT:  Head: Normocephalic.  Mouth/Throat: Oropharynx is clear and moist.  Eyes: Pupils are equal, round, and reactive to light.  Neck: Neck supple.  Cardiovascular: Normal rate, regular rhythm and normal heart sounds.   No murmur heard. Pulmonary/Chest: Effort normal and breath sounds normal. No respiratory distress. She has no wheezes. She has no rales.  Abdominal: Soft. Bowel sounds are normal. She exhibits no distension. There is no tenderness. There is no rebound.  Musculoskeletal: She exhibits no edema.  Lymphadenopathy:    She has no  cervical adenopathy.  Neurological: She is alert and oriented to person, place, and time.  Had Slurred and Slow speech. Did not have any Focal deficits. Finger nose was intact.  Skin: Skin is warm and dry.  Psychiatric: She has a normal mood and affect. Her behavior is normal. Thought content normal. Her speech is delayed.    Labs reviewed: Basic Metabolic Panel:  Recent Labs  04/13/16 1007 04/14/16 0339 04/15/16 0505  08/23/16 0641 08/25/16 0447 08/26/16 0300  NA 138 137 136  < > 140 142 142  K 3.5 3.9 3.8  < > 3.4* 3.4* 3.5  CL 107 106 106  < > 107 108 109  CO2 23 23 24   < > 25 25 26   GLUCOSE 242* 115* 108*  < > 94 96 101*  BUN 11 11 11   < > 15 14 16   CREATININE 1.01* 0.96 0.82  < > 1.08* 0.98 1.00  CALCIUM 8.7* 8.8* 9.0  < > 9.6 9.3 9.5  MG 1.7 1.8 2.0  --   --   --   --   PHOS 2.8 3.0 3.9  --   --   --   --   < > = values in this interval not displayed. Liver Function Tests:  Recent Labs  06/14/16 1541 08/20/16 1634 08/21/16 0448  AST 36 50* 39  ALT 34 37 37  ALKPHOS 161* 132* 116  BILITOT 0.9 1.2 1.0  PROT 7.9 7.3 6.6  ALBUMIN 4.1 3.8 3.4*   No results for input(s): LIPASE, AMYLASE in the last 8760 hours. No results for input(s): AMMONIA in the last 8760 hours. CBC:  Recent Labs  08/16/16 1713 08/20/16 1634  08/21/16 0448 08/23/16 0641 08/26/16 0300  WBC 6.7 7.7  < > 7.5 6.0 6.1  NEUTROABS 3.7 4.4  --   --   --  3.2  HGB 11.4* 12.2  < > 11.1* 11.4* 11.4*  HCT 35.7* 37.8  < > 35.1* 36.0 34.8*  MCV 83.8 84.6  < > 84.8 84.9 83.7  PLT 272 264  < > 251 259 253  < > = values in this interval not displayed. Cardiac Enzymes:  Recent Labs  04/11/16 2132  04/12/16 1430 04/12/16 2054 06/03/16 0827  CKTOTAL 197  --   --   --   --  TROPONINI  --   < > <0.03 <0.03 <0.03  < > = values in this interval not displayed. BNP: Invalid input(s): POCBNP Lab Results  Component Value Date   HGBA1C 6.4 (H) 06/03/2016   Lab Results  Component Value Date    TSH 0.671 02/24/2015   Lab Results  Component Value Date   VITAMINB12 1,199 (H) 08/24/2016   Lab Results  Component Value Date   FOLATE 39.6 02/24/2015   No results found for: IRON, TIBC, FERRITIN  Imaging and Procedures obtained prior to SNF admission: No results found.  Assessment/Plan Acute Ischemic CVA  Cryptogenic ? Emboli or Hypercoag state due to CML. Patient doing well in facility. She is on aspirin and Plavix . Has Loop Recorder Placed. Follow Up with Neurology. And Cadiology PT and PT in the Facility. Plan for discharge home when able to transfer and do basic ADL  Essential hypertension Stable on Losartan. Continue to monitor. Eventual goal of Less then 140/80 Per neurology. Dysphagia  On Dysphagic Diet  Chronic myeloid leukemia, BCR/ABL-positive Per family request holding off Nilotinob till she sees her Oncologist.  Hyperlipidemia LDL was 222 in 03/18 Will repeat Fasting Lipids. Has been on Lipitor Since 03/18    Type 2 Diabetes mellitus  it is diet Controlled A1C was 6.4 in the Hospital.  Large Thyroid Goitre With Substernal Extension Needs Surgery Consult. D/W Patient daughter she says she is not interested right now till her Mom gets little Better.   Family/ staff Communication:   Labs/tests ordered: BMP, CBC Fasting Lipids.  Total time spent in this patient care encounter was _45 minutes; greater than 50% of the visit spent d/w Nurses and her daughter. and coordinating care for problems addressed at this encounter.

## 2016-08-26 NOTE — Clinical Social Work Note (Signed)
Clinical Social Worker received notification from [facility]) stating that patient SNF authorization started Sunday, 6/17 Josem Kaufmann # [id number] with a follow up review date of 6/24.  Information left for [place] at [facility] by voicemail.    Barbette Or, [place]

## 2016-08-27 ENCOUNTER — Other Ambulatory Visit: Payer: Self-pay | Admitting: *Deleted

## 2016-08-27 ENCOUNTER — Ambulatory Visit (HOSPITAL_COMMUNITY): Payer: Medicare HMO | Admitting: Speech Pathology

## 2016-08-27 ENCOUNTER — Telehealth: Payer: Self-pay | Admitting: *Deleted

## 2016-08-27 ENCOUNTER — Encounter (HOSPITAL_COMMUNITY): Payer: Medicare HMO

## 2016-08-27 NOTE — Telephone Encounter (Signed)
-----   Message from Lonn Georgia, PA-C sent at 08/24/2016  1:46 PM EDT ----- I sent this to you since Dr Curt Bears did her ILR.  There is an order for an event monitor in the system and she just had ILR placed. See phone notes.  Can you d/c the event monitor?  Thanks [employer]

## 2016-08-27 NOTE — Telephone Encounter (Signed)
Event monitor order cancelled

## 2016-08-28 ENCOUNTER — Encounter (HOSPITAL_COMMUNITY): Payer: Self-pay | Admitting: Physical Therapy

## 2016-08-28 NOTE — Therapy (Signed)
[place], [place], [address] Phone: [phone]   Fax:  [phone]  Patient Details  Name: April Page MRN: [id number] Date of Birth: [birth year] Referring Provider:  No ref. provider found  Encounter Date: 08/28/2016   PHYSICAL THERAPY DISCHARGE SUMMARY  Visits from Start of Care: 10  Current functional level related to goals / functional outcomes: Patient has had a change in medical status and is now receiving care in SNF. DC from skilled OP PT services.    Remaining deficits: Unable to assess    Education / Equipment: None  Plan: Patient agrees to discharge.  Patient goals were not met. Patient is being discharged due to a change in medical status.  ?????      Deniece Ree PT, DPT [facility] [address] [place], [place], [address] Phone: [phone]   Fax:  [phone]

## 2016-08-29 ENCOUNTER — Telehealth: Payer: Self-pay | Admitting: Cardiology

## 2016-08-29 ENCOUNTER — Encounter (HOSPITAL_COMMUNITY): Payer: Medicare HMO

## 2016-08-29 ENCOUNTER — Ambulatory Visit (INDEPENDENT_AMBULATORY_CARE_PROVIDER_SITE_OTHER): Payer: Self-pay | Admitting: *Deleted

## 2016-08-29 DIAGNOSIS — I639 Cerebral infarction, unspecified: Secondary | ICD-10-CM

## 2016-08-29 LAB — CUP PACEART INCLINIC DEVICE CHECK
Date Time Interrogation Session: 20180621122457
MDC IDC PG IMPLANT DT: 20180615

## 2016-08-29 NOTE — Progress Notes (Signed)
Loop wound check in clinic d/t daily bleeding from ILR implant site. Gauze and tegaderm removed. Incision edges approximated, unable to express any blood. Wound without redness, swelling, drainage. Battery status: good. R-waves 0.75mV. No episodes. Monthly summary reports and ROV with WC PRN.

## 2016-08-29 NOTE — Telephone Encounter (Signed)
Suanne Marker (nurse from [facility]) calling asking if we can get April Page today for a wound check. I notified her that I had spoken with Ms. Philipp Ovens and I offered her an appointment today, but I needed to know if she was able to be transported and what time she may be able to come.  Ms. Philipp Ovens is at the [facility] now and will leave shortly to come to the [facility].

## 2016-08-29 NOTE — Telephone Encounter (Signed)
New message      Pt had loop recorder put in on 09-22-16.  Daughter states that it has not stopped bleeding.  It bleeds daily.  Is this normal?  Please call

## 2016-08-29 NOTE — Telephone Encounter (Signed)
Ms. April Page reports that Ms. April Page's LINQ wound has been bleeding every day since implant on 08/23/16. She reports that the gauze was saturated the first day but has just been a "spot" of blood on bandage daily. Wound check not scheduled. Ms. April Page will go to see Ms. April Page today at [facility] rehab center and see if she can come for a wound check today. She will call me back.

## 2016-09-02 ENCOUNTER — Encounter (HOSPITAL_COMMUNITY): Payer: Medicare HMO | Admitting: Physical Therapy

## 2016-09-02 ENCOUNTER — Encounter (HOSPITAL_COMMUNITY)
Admission: AD | Admit: 2016-09-02 | Discharge: 2016-09-02 | Disposition: A | Discharge status: Home / Self Care | Payer: Medicare HMO | Source: Skilled Nursing Facility | Attending: *Deleted | Admitting: *Deleted

## 2016-09-02 LAB — BASIC METABOLIC PANEL
Anion gap: 7 (ref 5–15)
BUN: 18 mg/dL (ref 6–20)
CHLORIDE: 107 mmol/L (ref 101–111)
CO2: 27 mmol/L (ref 22–32)
Calcium: 9.6 mg/dL (ref 8.9–10.3)
Creatinine, Ser: 1 mg/dL (ref 0.44–1.00)
GFR calc Af Amer: >60 mL/min (ref >60)
GFR calc non Af Amer: 55 mL/min — ABNORMAL LOW (ref >60)
Glucose, Bld: 106 mg/dL — ABNORMAL HIGH (ref 65–99)
Potassium: 4.1 mmol/L (ref 3.5–5.1)
SODIUM: 141 mmol/L (ref 135–145)

## 2016-09-02 LAB — CBC WITH DIFFERENTIAL/PLATELET
Basophils Absolute: 0.1 10*3/uL (ref 0.0–0.1)
Basophils Relative: 1 %
EOS ABS: 0.2 10*3/uL (ref 0.0–0.7)
Eosinophils Relative: 3 %
HEMATOCRIT: 35.2 % — AB (ref 36.0–46.0)
HEMOGLOBIN: 11.2 g/dL — AB (ref 12.0–15.0)
LYMPHS ABS: 2.9 10*3/uL (ref 0.7–4.0)
Lymphocytes Relative: 43 %
MCH: 27.1 pg (ref 26.0–34.0)
MCHC: 31.8 g/dL (ref 30.0–36.0)
MCV: 85 fL (ref 78.0–100.0)
MONO ABS: 0.6 10*3/uL (ref 0.1–1.0)
MONOS PCT: 9 %
NEUTROS PCT: 44 %
Neutro Abs: 3 10*3/uL (ref 1.7–7.7)
Platelets: 259 10*3/uL (ref 150–400)
RBC: 4.14 MIL/uL (ref 3.87–5.11)
RDW: 16.6 % — AB (ref 11.5–15.5)
WBC: 6.7 10*3/uL (ref 4.0–10.5)

## 2016-09-02 LAB — LIPID PANEL
Cholesterol: 160 mg/dL (ref 0–200)
HDL: 35 mg/dL — ABNORMAL LOW (ref >40)
LDL CALC: 82 mg/dL (ref 0–99)
Total CHOL/HDL Ratio: 4.6 RATIO
Triglycerides: 217 mg/dL — ABNORMAL HIGH (ref <150)
VLDL: 43 mg/dL — AB (ref 0–40)

## 2016-09-05 ENCOUNTER — Encounter (HOSPITAL_COMMUNITY): Payer: Medicare HMO | Admitting: Physical Therapy

## 2016-09-05 ENCOUNTER — Ambulatory Visit: Payer: Medicare HMO | Admitting: Physician Assistant

## 2016-09-09 ENCOUNTER — Encounter (HOSPITAL_COMMUNITY): Payer: Medicare HMO | Admitting: Physical Therapy

## 2016-09-10 ENCOUNTER — Ambulatory Visit: Payer: Medicare HMO | Admitting: Nurse Practitioner

## 2016-09-12 ENCOUNTER — Encounter (HOSPITAL_COMMUNITY): Payer: Medicare HMO | Admitting: Physical Therapy

## 2016-09-16 ENCOUNTER — Encounter (HOSPITAL_COMMUNITY): Payer: Medicare HMO | Admitting: Physical Therapy

## 2016-09-19 ENCOUNTER — Encounter (HOSPITAL_COMMUNITY): Payer: Medicare HMO | Admitting: Physical Therapy

## 2016-09-23 ENCOUNTER — Ambulatory Visit (INDEPENDENT_AMBULATORY_CARE_PROVIDER_SITE_OTHER): Payer: Medicare HMO | Admitting: *Deleted

## 2016-09-23 ENCOUNTER — Ambulatory Visit (HOSPITAL_COMMUNITY): Payer: Medicare HMO

## 2016-09-23 ENCOUNTER — Encounter (HOSPITAL_COMMUNITY): Payer: Self-pay

## 2016-09-23 ENCOUNTER — Encounter (HOSPITAL_COMMUNITY): Payer: Medicare HMO

## 2016-09-23 ENCOUNTER — Encounter (HOSPITAL_COMMUNITY): Payer: Medicare HMO | Attending: Oncology | Admitting: Oncology

## 2016-09-23 VITALS — BP 166/83 | HR 90 | Temp 98.6°F | Resp 16 | Wt 148.6 lb

## 2016-09-23 DIAGNOSIS — Z8673 Personal history of transient ischemic attack (TIA), and cerebral infarction without residual deficits: Secondary | ICD-10-CM | POA: Insufficient documentation

## 2016-09-23 DIAGNOSIS — Z7902 Long term (current) use of antithrombotics/antiplatelets: Secondary | ICD-10-CM | POA: Insufficient documentation

## 2016-09-23 DIAGNOSIS — Z79899 Other long term (current) drug therapy: Secondary | ICD-10-CM | POA: Insufficient documentation

## 2016-09-23 DIAGNOSIS — I639 Cerebral infarction, unspecified: Secondary | ICD-10-CM

## 2016-09-23 DIAGNOSIS — C9211 Chronic myeloid leukemia, BCR/ABL-positive, in remission: Secondary | ICD-10-CM

## 2016-09-23 DIAGNOSIS — Z7982 Long term (current) use of aspirin: Secondary | ICD-10-CM | POA: Insufficient documentation

## 2016-09-23 DIAGNOSIS — I10 Essential (primary) hypertension: Secondary | ICD-10-CM | POA: Insufficient documentation

## 2016-09-23 DIAGNOSIS — E785 Hyperlipidemia, unspecified: Secondary | ICD-10-CM | POA: Insufficient documentation

## 2016-09-23 DIAGNOSIS — K219 Gastro-esophageal reflux disease without esophagitis: Secondary | ICD-10-CM | POA: Insufficient documentation

## 2016-09-23 LAB — COMPREHENSIVE METABOLIC PANEL
ALT: 16 U/L (ref 14–54)
AST: 25 U/L (ref 15–41)
Albumin: 4.1 g/dL (ref 3.5–5.0)
Alkaline Phosphatase: 107 U/L (ref 38–126)
Anion gap: 9 (ref 5–15)
BILIRUBIN TOTAL: 0.8 mg/dL (ref 0.3–1.2)
BUN: 20 mg/dL (ref 6–20)
CHLORIDE: 107 mmol/L (ref 101–111)
CO2: 28 mmol/L (ref 22–32)
CREATININE: 1.07 mg/dL — AB (ref 0.44–1.00)
Calcium: 10.7 mg/dL — ABNORMAL HIGH (ref 8.9–10.3)
GFR, EST AFRICAN AMERICAN: 59 mL/min — AB (ref >60)
GFR, EST NON AFRICAN AMERICAN: 51 mL/min — AB (ref >60)
Glucose, Bld: 120 mg/dL — ABNORMAL HIGH (ref 65–99)
POTASSIUM: 3.7 mmol/L (ref 3.5–5.1)
Sodium: 144 mmol/L (ref 135–145)
TOTAL PROTEIN: 7.6 g/dL (ref 6.5–8.1)

## 2016-09-23 LAB — CBC WITH DIFFERENTIAL/PLATELET
Basophils Absolute: 0 10*3/uL (ref 0.0–0.1)
Basophils Relative: 0 %
EOS PCT: 1 %
Eosinophils Absolute: 0.1 10*3/uL (ref 0.0–0.7)
HEMATOCRIT: 36.5 % (ref 36.0–46.0)
Hemoglobin: 11.7 g/dL — ABNORMAL LOW (ref 12.0–15.0)
LYMPHS ABS: 2.2 10*3/uL (ref 0.7–4.0)
LYMPHS PCT: 33 %
MCH: 27.5 pg (ref 26.0–34.0)
MCHC: 32.1 g/dL (ref 30.0–36.0)
MCV: 85.9 fL (ref 78.0–100.0)
MONO ABS: 0.4 10*3/uL (ref 0.1–1.0)
MONOS PCT: 6 %
Neutro Abs: 4 10*3/uL (ref 1.7–7.7)
Neutrophils Relative %: 60 %
PLATELETS: 245 10*3/uL (ref 150–400)
RBC: 4.25 MIL/uL (ref 3.87–5.11)
RDW: 15.8 % — AB (ref 11.5–15.5)
WBC: 6.7 10*3/uL (ref 4.0–10.5)

## 2016-09-23 NOTE — Progress Notes (Signed)
Carelink Summary Report / Loop Recorder 

## 2016-09-23 NOTE — Patient Instructions (Signed)
[place] at [facility] Discharge Instructions  RECOMMENDATIONS MADE BY THE CONSULTANT AND ANY TEST RESULTS WILL BE SENT TO YOUR REFERRING PHYSICIAN.  RETURN IN ABOUT 3 MONTHS ( around 12/24/2016)  Thank you for choosing [place] at [facility] to provide your oncology and hematology care.  To afford each patient quality time with our provider, please arrive at least 15 minutes before your scheduled appointment time.    If you have a lab appointment with the [facility] please come in thru the  Main Entrance and check in at the main information desk  You need to re-schedule your appointment should you arrive 10 or more minutes late.  We strive to give you quality time with our providers, and arriving late affects you and other patients whose appointments are after yours.  Also, if you no show three or more times for appointments you may be dismissed from the clinic at the providers discretion.     Again, thank you for choosing [facility].  Our hope is that these requests will decrease the amount of time that you wait before being seen by our physicians.       _____________________________________________________________  Should you have questions after your visit to [facility], please contact our office at [phone] between the hours of 8:30 a.m. and 4:30 p.m.  Voicemails left after 4:30 p.m. will not be returned until the following business day.  For prescription refill requests, have your pharmacy contact our office.       Resources For Cancer Patients and their Caregivers ? American Cancer Society: Can assist with transportation, wigs, general needs, runs Look Good Feel Better.        7574202524 ? Cancer Care: Provides financial assistance, online support groups, medication/co-pay assistance.  1-800-813-HOPE 434 781 8001) ? [place] Assists [place] cancer patients and  their families through emotional , educational and financial support.  985-153-8152 ? [place] DSS Where to apply for food stamps, Medicaid and utility assistance. 619-768-7044 ? [facility]: Transportation to medical appointments. 941-620-4572 ? Social Security Administration: May apply for disability if have a Stage IV cancer. 805-877-7513 (202) 166-0961 ? [employer], Disability and Transit Services: Assists with nutrition, care and transit needs. [place] Support Programs: @10RELATIVEDAYS @ > Cancer Support Group  2nd Tuesday of the month 1pm-2pm, Journey Room  > Creative Journey  3rd Tuesday of the month 1130am-1pm, Journey Room  > Look Good Feel Better  1st Wednesday of the month 10am-12 noon, Journey Room (Call [place] to register [phone])

## 2016-09-23 NOTE — Progress Notes (Signed)
April Page, [place] [address]   CLINIC:  Medical Oncology/Hematology  PCP:  Redmond School, [place] [address] [phone]   REASON FOR VISIT:  Follow-up for CML, BCR/ABL positive, now in remission   CURRENT THERAPY: Tasigna daily    BRIEF ONCOLOGIC HISTORY:    Chronic myelogenous leukemia (CML), BCR-ABL1-positive ([place])   06/01/2014 Tumor Marker    Results for April, Page (MRN [id number]) as of 12/02/2014 09:30  06/01/2014 09:59 BCR ABL1 / ABL1 IS: 71.220 (H)       07/14/2014 Bone Marrow Biopsy    Bone Marrow, Aspirate,Biopsy, and Clot, right iliac - HYPERCELLULAR BONE MARROW WITH CHRONIC MYELOGENOUS LEUKEMIA. - SEE COMMENT. PERIPHERAL BLOOD: - CHRONIC MYELOGENOUS LEUKEMIA. Diagnosis Note Previous PCR analysis performed for bcr-abl1 is report      07/21/2014 - 08/04/2014 Chemotherapy    Tasigna 300 mg BID      08/04/2014 Miscellaneous    Ran out of 2 week sample secondary to issues with insurance approval.      08/15/2014 -  Chemotherapy    Tasigna 300 mg BID      09/17/2014 Tumor Marker    b3a2 transcript % 4.122       12/02/2014 Tumor Marker    b3a2 transcript: 0.046       03/31/2015 Tumor Marker    b3a2 transcript:  <0.001 %         03/31/2015 Remission    Complete molecular response, no detectable BCR/ABL using PCR assay      05/24/2016 Remission    NEGATIVE for the BCR-ABL1 e1a2 (p190), e13a2 (b2a2, p210) and e14a2 (b3a2, p210) fusion transcripts.        HISTORY OF PRESENT ILLNESS:  (From Dr. Donald Pore last note on 01/14/16)     INTERVAL HISTORY:   Patient presents today with her daughter for continued follow-up of her CML currently in remission. Since her last visit she is experienced an acute ischemic stroke and was hospitalized on 08/20/16 through 08/25/16. She initially presented with slurred speech and increased difficulty walking and swallowing. She was found to have a new stroke  affecting her internal capsule and right thalamic region. At the time of discharge she was placed on aspirin and Plavix for secondary prevention. HER-2 signal was discontinued at that time since it was thought that it may have contributed to her acute stroke. She has been offered to signal since that time. Patient is currently in rehabilitation center. She states that all of her stroke symptoms are improving. She denies any chest pain, shortness breath, abdominal pain. No recent infections.    REVIEW OF SYSTEMS:  Review of Systems  Constitutional: Negative for appetite change, chills, fatigue and fever.  HENT:  Negative.  Negative for lump/mass.   Eyes: Negative.   Respiratory: Negative.  Negative for cough and shortness of breath.   Cardiovascular: Negative.  Negative for chest pain and palpitations.  Gastrointestinal: Negative for abdominal pain, blood in stool, constipation, diarrhea, nausea and vomiting.  Endocrine: Negative.   Genitourinary: Negative.  Negative for dysuria, hematuria and vaginal bleeding.   Musculoskeletal:          Skin: Negative.  Negative for rash.  Neurological: Positive for extremity weakness (legs) and speech difficulty. Negative for dizziness.  Hematological: Negative.  Negative for adenopathy. Does not bruise/bleed easily.  Psychiatric/Behavioral: Negative.  Negative for depression and sleep disturbance. The patient is not nervous/anxious.      PAST MEDICAL/SURGICAL HISTORY:  Past Medical History:  Diagnosis Date  . Arthritis    stiff knees  . Blood dyscrasia   . Cancer ([place])   . CVA (cerebral vascular accident) ([place])   . GERD (gastroesophageal reflux disease)   . H/O: CML (chronic myeloid leukemia)   . Hyperlipidemia   . Hyperlipidemia   . Hypertension   . Obesity   . Pessary maintenance 07/01/2013   Past Surgical History:  Procedure Laterality Date  . ABDOMINAL HYSTERECTOMY    . CHOLECYSTECTOMY    . COLONOSCOPY  06/09/2002   EZM:OQHUTMLY  hemorrhoids; otherwise normal rectum, colon   . COLONOSCOPY N/A 08/25/2013   YTK:PTWSFKC diverticulosis. Single colonic polyp-removed  s/p segmental biopsy and stool sample. random colon bx negative. +benign leiomyoma.  . cystocele/rectocele repair  2009  . ESOPHAGOGASTRODUODENOSCOPY  06/09/2002   LEX:NTZGYF upper gastrointestinal tract s/p  54-French Maloney dilator  . FEMUR IM NAIL Right 04/12/2016   Procedure: INTRAMEDULLARY (IM) NAIL FEMORAL;  Surgeon: Nicholes Stairs, MD;  Location: [place] [facility];  Service: Orthopedics;  Laterality: Right;  . HERNIA REPAIR    . LOOP RECORDER INSERTION N/A 08/23/2016   Procedure: Loop Recorder Insertion;  Surgeon: Constance Haw, MD;  Location: [place] [facility];  Service: Cardiovascular;  Laterality: N/A;  . OOPHORECTOMY    . TEE WITHOUT CARDIOVERSION N/A 08/23/2016   Procedure: TRANSESOPHAGEAL ECHOCARDIOGRAM (TEE);  Surgeon: Fay Records, MD;  Location: [facility] ENDOSCOPY;  Service: Cardiovascular;  Laterality: N/A;     SOCIAL HISTORY:  Social History   Social History  . Marital status: Widowed    Spouse name: N/A  . Number of children: 6  . Years of education: associate   Occupational History  .  Retired   Social History Main Topics  . Smoking status: Never Smoker  . Smokeless tobacco: Never Used  . Alcohol use No  . Drug use: No  . Sexual activity: Not Currently    Birth control/ protection: Surgical     Comment: hyst   Other Topics Concern  . Not on file   Social History Narrative  . No narrative on file    FAMILY HISTORY:  Family History  Problem Relation Age of Onset  . Anuerysm Mother        deceased age 55, brain anuerysm  . Early death Mother 39  . Heart disease Father   . Hypertension Sister   . Obesity Brother   . Hypertension Sister   . Arthritis Sister   . Other Son        cardiac arrest  . Heart disease Child 19       cardiac arrest  . Colon cancer Neg Hx     CURRENT MEDICATIONS:  Outpatient Encounter  Prescriptions as of 09/23/2016  Medication Sig Note  . acetaminophen (TYLENOL) 325 MG tablet Take 2 tablets (650 mg total) by mouth every 4 (four) hours as needed for mild pain (or temp > 37.5 C (99.5 F)).   Marland Kitchen aspirin 325 MG tablet Take 1 tablet (325 mg total) by mouth daily.   Marland Kitchen atorvastatin (LIPITOR) 40 MG tablet Take 1 tablet (40 mg total) by mouth daily at 6 PM.   . clopidogrel (PLAVIX) 75 MG tablet Take 1 tablet (75 mg total) by mouth daily.   Marland Kitchen losartan (COZAAR) 100 MG tablet Take 1 tablet (100 mg total) by mouth daily. Resume on 3/28   . Multiple Vitamin (MULTIVITAMIN WITH MINERALS) TABS tablet Take 1 tablet by mouth daily.   . nilotinib (  TASIGNA) 150 MG capsule Take 2 capsules (300 mg total) by mouth every 12 (twelve) hours.   . polyethylene glycol (MIRALAX / GLYCOLAX) packet Take 17 g by mouth daily. 08/20/2016: Patient had a stroke on Friday (08/16/16) and has had loose BM(s) since then   No facility-administered encounter medications on file as of 09/23/2016.     ALLERGIES:  No Known Allergies   PHYSICAL EXAM:  ECOG Performance status: 2 - Symptomatic (requires assistance secondary to recent right hip fracture/surgery)  Vitals:   09/23/16 1014  BP: (!) 166/83  Pulse: 90  Resp: 16  Temp: 98.6 F (37 C)   Filed Weights   09/23/16 1014  Weight: 148 lb 9.6 oz (67.4 kg)    Physical Exam  Constitutional: She is oriented to person, place, and time and well-developed, well-nourished, and in no distress.  HENT:  Head: Normocephalic.  Mouth/Throat: Oropharynx is clear and moist. No oropharyngeal exudate.  Eyes: Pupils are equal, round, and reactive to light. Conjunctivae are normal. No scleral icterus.  Neck: Normal range of motion. Neck supple.  Cardiovascular: Normal rate, regular rhythm and normal heart sounds.   Pulmonary/Chest: Effort normal and breath sounds normal. No respiratory distress.  Abdominal: Soft. Bowel sounds are normal. There is no tenderness.    Musculoskeletal: Normal range of motion. She exhibits no edema.  Lymphadenopathy:    She has no cervical adenopathy.       Right: No supraclavicular adenopathy present.       Left: No supraclavicular adenopathy present.  Neurological: She is alert and oriented to person, place, and time. No cranial nerve deficit.  Mild aphasia, slow speech  Skin: Skin is warm and dry. No rash noted.  Psychiatric: Mood, memory and judgment normal. She has a flat affect.  Nursing note and vitals reviewed.    LABORATORY DATA:  I have reviewed the labs as listed.  CBC    Component Value Date/Time   WBC 6.7 09/23/2016 0932   RBC 4.25 09/23/2016 0932   HGB 11.7 (L) 09/23/2016 0932   HCT 36.5 09/23/2016 0932   PLT 245 09/23/2016 0932   MCV 85.9 09/23/2016 0932   MCH 27.5 09/23/2016 0932   MCHC 32.1 09/23/2016 0932   RDW 15.8 (H) 09/23/2016 0932   LYMPHSABS 2.2 09/23/2016 0932   MONOABS 0.4 09/23/2016 0932   EOSABS 0.1 09/23/2016 0932   BASOSABS 0.0 09/23/2016 0932   CMP Latest Ref Rng & Units 09/23/2016 09/02/2016 08/26/2016  Glucose 65 - 99 mg/dL 120(H) 106(H) 101(H)  BUN 6 - 20 mg/dL _0 Creatinine 0.44 - 1.00 mg/dL 1.07(H) 1.00 1.00  Sodium 135 - 145 mmol/L 144 141 142  Potassium 3.5 - 5.1 mmol/L 3.7 4.1 3.5  Chloride 101 - 111 mmol/L 107 107 109  CO2 22 - 32 mmol/L _1 Calcium 8.9 - 10.3 mg/dL 10.7(H) 9.6 9.5  Total Protein 6.5 - 8.1 g/dL 7.6 - -  Total Bilirubin 0.3 - 1.2 mg/dL 0.8 - -  Alkaline Phos 38 - 126 U/L 107 - -  AST 15 - 41 U/L 25 - -  ALT 14 - 54 U/L 16 - -   Results for MIYANI, CRONIC (MRN [id number])   Ref. Range 06/01/2014 09:59  BCR ABL1 / ABL1 IS Latest Units: % 71.220 (H)      PENDING LABS:    DIAGNOSTIC IMAGING:    PATHOLOGY:  Bone marrow biopsy: 07/14/14    ASSESSMENT & PLAN:   CML, BCR/ALB positive,  in remission:  -Currently in complete molecular remission with BCR/ABL negative as well as hematologic remission.  BCR/ABL results pending for  today.  -Off of tasigna since June due to hospitalization for acute stroke. Tasigna has been linked with increased risk for developing atherosclerosis and experiencing vascular adverse events, including stroke. -After weighing the risk and benefits with the family and the patient regarding risk of progression of her CML while off TKI and risk of adverse events, it was decided that the patient is to resume her tasigna 365m PO BID. -RTC in 3 months for follow up with labs. Will need to repeat EKG on next visit to evaluation for prolonged QTc.  Acute Ischemic stroke - continue asa and plavix. - continue rehab.  Orders Placed This Encounter  Procedures  . BCR-ABL1, CML/ALL, PCR, QUANT    Standing Status:   Future    Standing Expiration Date:   09/23/2017  . CBC with Differential    Standing Status:   Future    Standing Expiration Date:   09/23/2017  . Comprehensive metabolic panel    Standing Status:   Future    Standing Expiration Date:   09/23/2017  . Magnesium    Standing Status:   Future    Standing Expiration Date:   09/23/2017  . Phosphorus    Standing Status:   Future    Standing Expiration Date:   09/23/2017

## 2016-09-24 ENCOUNTER — Encounter (HOSPITAL_COMMUNITY): Payer: Self-pay | Admitting: Oncology

## 2016-09-24 NOTE — Progress Notes (Signed)
Pt and family does decided not to take Tasigna anymore per [facility] x [phone].   Order is written for discontinuation of Tasigna.  Jakya Dovidio, PA-C 09/24/2016 3:12 PM

## 2016-09-25 ENCOUNTER — Encounter: Payer: Self-pay | Admitting: Internal Medicine

## 2016-09-25 ENCOUNTER — Non-Acute Institutional Stay (SKILLED_NURSING_FACILITY): Payer: Medicare HMO | Admitting: Internal Medicine

## 2016-09-25 DIAGNOSIS — I639 Cerebral infarction, unspecified: Secondary | ICD-10-CM | POA: Diagnosis not present

## 2016-09-25 DIAGNOSIS — E118 Type 2 diabetes mellitus with unspecified complications: Secondary | ICD-10-CM

## 2016-09-25 DIAGNOSIS — I10 Essential (primary) hypertension: Secondary | ICD-10-CM | POA: Diagnosis not present

## 2016-09-25 DIAGNOSIS — E785 Hyperlipidemia, unspecified: Secondary | ICD-10-CM | POA: Diagnosis not present

## 2016-09-25 DIAGNOSIS — C9211 Chronic myeloid leukemia, BCR/ABL-positive, in remission: Secondary | ICD-10-CM

## 2016-09-25 NOTE — Progress Notes (Signed)
Location:   [place] Room Number: 130/P Place of Service:  SNF (31)  Provider: Mirian Casco,Shankar Silber  PCP: Redmond School, MD Patient Care Team: Redmond School, MD as PCP - General (Internal Medicine) Gala Romney, Cristopher Estimable, MD as Consulting Physician (Gastroenterology)  Extended Emergency Contact Information Primary Emergency Contact: Henderson,Angela Address: [address]          [place], [place] [address] [place] Phone: [phone] Relation: Daughter Secondary Emergency Contact: Blackwell,Michelle Address: [place], [place] [address] [place] Phone: [phone] Relation: Daughter  Code Status: Full Code Goals of care:  Advanced Directive information Advanced Directives 09/25/2016  Does Patient Have a Medical Advance Directive? Yes  Type of Advance Directive (No Data)  Does patient want to make changes to medical advance directive? No - Patient declined  Would patient like information on creating a medical advance directive? No - Patient declined     No Known Allergies  Chief Complaint  Patient presents with  . Discharge Note    HPI:  71 y.o. female   seen today for discharge from facility.   Patient has h/o Diabetes Mellitus, Hypertension, Hyperlipidemia, h/o CML in remission, right Femoral Fracture in 02/18,  Recurrent CVA, Dysphagia.   Patient had initial Stroke in 03/18 with small infarct involving the corpus callosum associated with a stenosis of the PCA. She  had small Infarct in Left ACA territory in 08/16/16.   Prior to this admission she came back to the hospital with difficulty walking and swelling repeat MRI showed a new thalamic infarct-she was on Nilotinib for her CML-but currently she is not receiving this because of family concerns it may have helped cause the strokes.  She has gained strength here and will be going home she does have strong family support and will have 24-hour care  she also has follow-up with OB/GYN oncology as well as neurology.  She does have a history of diabetes type 2 diet controlled blood sugars have been quite stable largely in the lower 100s.  She does not have any complaints today vital signs appear to be stable blood pressure is mildly elevated at 150/83 and appears eventual goal is to get the systolic less than 419-FXT diastolic less than 02--IOX continues on Losartan     Past Medical History:  Diagnosis Date  . Arthritis    stiff knees  . Blood dyscrasia   . Cancer ([place])   . CVA (cerebral vascular accident) ([place])   . GERD (gastroesophageal reflux disease)   . H/O: CML (chronic myeloid leukemia)   . Hyperlipidemia   . Hyperlipidemia   . Hypertension   . Obesity   . Pessary maintenance 07/01/2013    Past Surgical History:  Procedure Laterality Date  . ABDOMINAL HYSTERECTOMY    . CHOLECYSTECTOMY    . COLONOSCOPY  06/09/2002   BDZ:HGDJMEQA hemorrhoids; otherwise normal rectum, colon   . COLONOSCOPY N/A 08/25/2013   STM:HDQQIWL diverticulosis. Single colonic polyp-removed  s/p segmental biopsy and stool sample. random colon bx negative. +benign leiomyoma.  . cystocele/rectocele repair  2009  . ESOPHAGOGASTRODUODENOSCOPY  06/09/2002   NLG:XQJJHE upper gastrointestinal tract s/p  54-French Maloney dilator  . FEMUR IM NAIL Right 04/12/2016   Procedure: INTRAMEDULLARY (IM) NAIL FEMORAL;  Surgeon: Nicholes Stairs, MD;  Location: [place];  Service: Orthopedics;  Laterality: Right;  . HERNIA REPAIR    . LOOP RECORDER INSERTION N/A 08/23/2016  Procedure: Loop Recorder Insertion;  Surgeon: Constance Haw, MD;  Location: [place] [facility];  Service: Cardiovascular;  Laterality: N/A;  . OOPHORECTOMY    . TEE WITHOUT CARDIOVERSION N/A 08/23/2016   Procedure: TRANSESOPHAGEAL ECHOCARDIOGRAM (TEE);  Surgeon: Fay Records, MD;  Location: [facility] ENDOSCOPY;  Service: Cardiovascular;  Laterality: N/A;      reports that she has never smoked.  She has never used smokeless tobacco. She reports that she does not drink alcohol or use drugs. Social History   Social History  . Marital status: Widowed    Spouse name: N/A  . Number of children: 6  . Years of education: associate   Occupational History  .  Retired   Social History Main Topics  . Smoking status: Never Smoker  . Smokeless tobacco: Never Used  . Alcohol use No  . Drug use: No  . Sexual activity: Not Currently    Birth control/ protection: Surgical     Comment: hyst   Other Topics Concern  . Not on file   Social History Narrative  . No narrative on file   Functional Status Survey:    No Known Allergies  Pertinent  Health Maintenance Due  Topic Date Due  . FOOT EXAM  04/05/1955  . OPHTHALMOLOGY EXAM  04/05/1955  . URINE MICROALBUMIN  04/05/1955  . PNA vac Low Risk Adult (1 of 2 - PCV13) 04/04/2010  . INFLUENZA VACCINE  10/09/2016  . HEMOGLOBIN A1C  12/04/2016  . MAMMOGRAM  05/31/2017  . COLONOSCOPY  08/26/2023  . DEXA SCAN  Completed    Medications: Outpatient Encounter Prescriptions as of 09/25/2016  Medication Sig  . acetaminophen (TYLENOL) 325 MG tablet Take 2 tablets (650 mg total) by mouth every 4 (four) hours as needed for mild pain (or temp > 37.5 C (99.5 F)).  Marland Kitchen aspirin 325 MG tablet Take 1 tablet (325 mg total) by mouth daily.  Marland Kitchen atorvastatin (LIPITOR) 40 MG tablet Take 1 tablet (40 mg total) by mouth daily at 6 PM.  . clopidogrel (PLAVIX) 75 MG tablet Take 1 tablet (75 mg total) by mouth daily.  Marland Kitchen losartan (COZAAR) 100 MG tablet Take 1 tablet (100 mg total) by mouth daily. Resume on 3/28  . Multiple Vitamin (MULTIVITAMIN WITH MINERALS) TABS tablet Take 1 tablet by mouth daily.  . nilotinib (TASIGNA) 150 MG capsule Take 2 capsules (300 mg total) by mouth every 12 (twelve) hours.  . polyethylene glycol (MIRALAX / GLYCOLAX) packet Take 17 g by mouth daily.   No facility-administered encounter medications on file as of 09/25/2016.       Review of Systems   General she does not complaining any fever or chills.  Skin does not complain of rashes or itching.  Head ears eyes nose mouth and throat doesn't have a history of goiter at some point will need surgery follow-up.  Does not complain of visual changes sore throat or difficulty swallowing.  Respiratory denies shortness breath or cough.  Cardiac denies chest pain does not appear to have significant lower extremity edema.  GI is not complaining of any abdominal discomfort session she has a pretty good appetite does not complaining nausea vomiting diarrhea or constipation.  GU denies dysuria.  Muscle skeletal continue his some weakness but does not complain of any joint pain at this time.  Neurologic does have a history of CVA with some slurred speech does not complain of headache dizziness or syncope currently  Psych she is alert and oriented pleasant  and appropriate  Vitals:   09/25/16 1305  BP: (!) 150/83  Pulse: 90  Resp: 20  Temp: 97.6 F (36.4 C)  TempSrc: Oral    Physical Exam   In general this is a pleasant elderly female in no distress sitting comfortably in her wheelchair.  Her skin is warm and dry.  Eyes pupils appear reactive light sclerae and conjunctivae are clear visual acuity appears grossly intact.  Neck is supple.  Chest is clear to auscultation there is no labored breathing.  Heart is regular rate and rhythm without murmur gallop or rub does not appear to have significant lower extremity edema.  Abdomen soft nontender with active bowel sounds.  Musculoskeletal is able to move all extremities 4 grip strength appears to be adequate bilaterally.  Ambulates in a wheelchair and with a walker at this point.  Neurologic she does continue to have some slightly slurred speech cranial nerves do appear to be intact otherwise-could not really appreciate lateralizing findings.  Psych she is alert and oriented pleasant and  appropriate somewhat slowed speech but this is not new  Labs reviewed: Basic Metabolic Panel:  Recent Labs  04/13/16 1007 04/14/16 0339 04/15/16 0505  08/26/16 0300 09/02/16 0400 09/23/16 0932  NA 138 137 136  < > 142 141 144  K 3.5 3.9 3.8  < > 3.5 4.1 3.7  CL 107 106 106  < > 109 107 107  CO2 23 23 24   < > 26 27 28   GLUCOSE 242* 115* 108*  < > 101* 106* 120*  BUN 11 11 11   < > 16 18 20   CREATININE 1.01* 0.96 0.82  < > 1.00 1.00 1.07*  CALCIUM 8.7* 8.8* 9.0  < > 9.5 9.6 10.7*  MG 1.7 1.8 2.0  --   --   --   --   PHOS 2.8 3.0 3.9  --   --   --   --   < > = values in this interval not displayed. Liver Function Tests:  Recent Labs  08/20/16 1634 08/21/16 0448 09/23/16 0932  AST 50* 39 25  ALT 37 37 16  ALKPHOS 132* 116 107  BILITOT 1.2 1.0 0.8  PROT 7.3 6.6 7.6  ALBUMIN 3.8 3.4* 4.1   No results for input(s): LIPASE, AMYLASE in the last 8760 hours. No results for input(s): AMMONIA in the last 8760 hours. CBC:  Recent Labs  08/26/16 0300 09/02/16 0400 09/23/16 0932  WBC 6.1 6.7 6.7  NEUTROABS 3.2 3.0 4.0  HGB 11.4* 11.2* 11.7*  HCT 34.8* 35.2* 36.5  MCV 83.7 85.0 85.9  PLT 253 259 245   Cardiac Enzymes:  Recent Labs  04/11/16 2132  04/12/16 1430 04/12/16 2054 06/03/16 0827  CKTOTAL 197  --   --   --   --   TROPONINI  --   < > <0.03 <0.03 <0.03  < > = values in this interval not displayed. BNP: Invalid input(s): POCBNP CBG:  Recent Labs  08/18/16 0750 08/18/16 1140 08/20/16 1633  GLUCAP 110* 129* 131*    Procedures and Imaging Studies During Stay: No results found.  Assessment/Plan:    Acute Ischemic CVA  Cryptogenic ? Emboli or Hypercoag state due to [facility]  She appears to have done well facility in gain strength continues on aspirin and Plavix.  She does have follow up with neurology as well.  She will have outpatient [facility] at [facility].  She also will have 24-hour care at home.  #2  hypertension t--s recent blood  pressures 720/[id number] to get systolic under 479 diastolic under [id number] will have follow-up by neurology at this point continue current dose since she is about to go home she continues on losartan.  #3 history of CML-she is followed by oncology and does have follow-up she is currently not receiving the Nilotinob secondary to family concerns of increased stroke risk this will need follow-up by oncology  CML is thought to be in remission.  #4-history hyperlipidemia--continues on Lipitor-lipid panel in June 2018 showed an LDL of 82.  #5 history of type 2 diabetes this appears to be diet-controlled hemoglobin A1c was 6.4 in the hospital blood sugars have been largely in the lower 100s during her stay here.  #6 history of large thyroid goiter with substernal extension-she will need a surgery consult this can be done as an outpatient this has been discussed previously with her daughter who was not interested at the moment because of her mom's fragile status recent CVA--.  Again she will have 24-hour care when she goes home she does have follow-up with oncology as well as neurology and OB/GYN.  She does have a very supportive family as well as her daughter actually is in the room with her today.  [id number] note greater than 30 minutes spent on this discharge summary-greater than 50% of time spent coordinating plan of care for numerous diagnoses

## 2016-09-26 ENCOUNTER — Ambulatory Visit (HOSPITAL_COMMUNITY): Payer: Medicare HMO | Attending: Internal Medicine | Admitting: Physical Therapy

## 2016-09-26 DIAGNOSIS — R278 Other lack of coordination: Secondary | ICD-10-CM | POA: Insufficient documentation

## 2016-09-26 DIAGNOSIS — R2681 Unsteadiness on feet: Secondary | ICD-10-CM | POA: Diagnosis not present

## 2016-09-26 DIAGNOSIS — R262 Difficulty in walking, not elsewhere classified: Secondary | ICD-10-CM | POA: Insufficient documentation

## 2016-09-26 DIAGNOSIS — M6281 Muscle weakness (generalized): Secondary | ICD-10-CM | POA: Insufficient documentation

## 2016-09-26 DIAGNOSIS — R29898 Other symptoms and signs involving the musculoskeletal system: Secondary | ICD-10-CM | POA: Diagnosis not present

## 2016-09-26 NOTE — Therapy (Signed)
[place], [place], [address] Phone: [phone]   Fax:  [phone]  Physical Therapy Evaluation  Patient Details  Name: April Page MRN: [id number] Date of Birth: 24-Aug-1945 Referring Provider: Redmond School   Encounter Date: 09/26/2016      PT End of Session - 09/26/16 1125    Visit Number 1   Number of Visits 17   Date for PT Re-Evaluation 10/24/16   Authorization Type [employer] H[employer]   Authorization Time Period 09/26/16 to 11/27/16   Authorization - Visit Number 1   Authorization - Number of Visits 10   PT Start Time 8242   PT Stop Time 1114   PT Time Calculation (min) 42 min   Activity Tolerance Patient tolerated treatment well   Behavior During Therapy [facility] for tasks assessed/performed      Past Medical History:  Diagnosis Date  . Arthritis    stiff knees  . Blood dyscrasia   . Cancer ([place])   . CVA (cerebral vascular accident) ([place])   . GERD (gastroesophageal reflux disease)   . H/O: CML (chronic myeloid leukemia)   . Hyperlipidemia   . Hyperlipidemia   . Hypertension   . Obesity   . Pessary maintenance 07/01/2013    Past Surgical History:  Procedure Laterality Date  . ABDOMINAL HYSTERECTOMY    . CHOLECYSTECTOMY    . COLONOSCOPY  06/09/2002   PNT:IRWERXVQ hemorrhoids; otherwise normal rectum, colon   . COLONOSCOPY N/A 08/25/2013   MGQ:QPYPPJK diverticulosis. Single colonic polyp-removed  s/p segmental biopsy and stool sample. random colon bx negative. +benign leiomyoma.  . cystocele/rectocele repair  2009  . ESOPHAGOGASTRODUODENOSCOPY  06/09/2002   DTO:IZTIWP upper gastrointestinal tract s/p  54-French Maloney dilator  . FEMUR IM NAIL Right 04/12/2016   Procedure: INTRAMEDULLARY (IM) NAIL FEMORAL;  Surgeon: Nicholes Stairs, MD;  Location: [facility];  Service: Orthopedics;  Laterality: Right;  . HERNIA REPAIR    . LOOP RECORDER INSERTION N/A 08/23/2016   Procedure: Loop Recorder  Insertion;  Surgeon: Constance Haw, MD;  Location: [place] [facility];  Service: Cardiovascular;  Laterality: N/A;  . OOPHORECTOMY    . TEE WITHOUT CARDIOVERSION N/A 08/23/2016   Procedure: TRANSESOPHAGEAL ECHOCARDIOGRAM (TEE);  Surgeon: Fay Records, MD;  Location: [facility];  Service: Cardiovascular;  Laterality: N/A;    There were no vitals filed for this visit.       Subjective Assessment - 09/26/16 1040    Subjective Patient had been attending PT for her hip and began to have multiple CVAs; she was sent to [facility] where she was stabilized and then sent to the [facility] for rehab. She is now at her home with her daughters living with her. She states taht a big concern of hers is balance, walking is also difficult. She states she can dress and bathe herself if she has to, but more often than not her daugthers help her. She states she was at one time choking on food and water but this is better and does not happen anymore. No recent falls but she feels unsteady in general.    Pertinent History HTN, CVA March 2018, DM, R hip fractures, leukemia, R femur IM nail s/p fall 04/12/16, multiple CVAs this year    Patient Stated Goals to regain independence    Currently in Pain? No/denies            [facility] PT Assessment - 09/26/16 0001  Assessment   Medical Diagnosis CVA    Referring Provider Redmond School    Onset Date/Surgical Date --  spring/summer 2018   Next MD Visit Dr. Gerarda Fraction 10/07/16  PT at [facility] for strokes; PT here for hip      Precautions   Precautions Fall     Balance Screen   Has the patient fallen in the past 6 months No   Has the patient had a decrease in activity level because of a fear of falling?  Yes   Is the patient reluctant to leave their home because of a fear of falling?  No     Prior Function   Level of Independence Requires assistive device for independence;Needs assistance with ADLs;Needs assistance with homemaking   Vocation Retired      Strength   Right Hip Flexion 3/5   Right Hip ABduction 3-/5   Left Hip Flexion 3/5   Left Hip ABduction 3+/5   Right Knee Extension 5/5   Left Knee Extension 5/5   Right Ankle Dorsiflexion 4+/5   Left Ankle Dorsiflexion 4+/5     Bed Mobility   Bed Mobility Rolling Right;Rolling Left;Supine to Sit;Sit to Supine     Transfers   Five time sit to stand comments  60 seconds with UEs      Ambulation/Gait   Ambulation Distance (Feet) 68 Feet   Assistive device Rolling walker   Gait Pattern Step-through pattern;Shuffle;Decreased trunk rotation;Trunk flexed;Narrow base of support   Ambulation Surface Level;Indoor   Gait Comments verbal cues for safety with walker      High Level Balance   High Level Balance Comments general unsteadiness noted with static stance with no device/min guard approx 1 minute; unable to achieve and maintain semi-tandem stance             Objective measurements completed on examination: See above findings.                  PT Education - 09/26/16 1125    Education provided Yes   Education Details prognosis, POC, HEP   Person(s) Educated Patient;Child(ren)   Methods Explanation   Comprehension Verbalized understanding;Returned demonstration;Need further instruction          PT Short Term Goals - 09/26/16 1132      PT SHORT TERM GOAL #1   Title Patient to be able to complete all functional mobility, including bed mobility, with Mod(I) in order to improve functional independence    Time 4   Period Weeks   Status New     PT SHORT TERM GOAL #2   Title Patient to be able to ambulate at gait speed of at least 0.74m/s with walker in order to improve access to home and community    Baseline 0.33m/s at eval    Time 4   Period Weeks   Status New     PT SHORT TERM GOAL #3   Title Patient to be able to complete 5 time sit to stand test in 30 seconds with UEs in order to show improved functional mobility and strength    Time 4    Period Weeks   Status New     PT SHORT TERM GOAL #4   Title Patient to be able to correctly and consistently perform HEP at least 5 days per week, to be updated as appropriate    Time 1   Period Weeks   Status New  PT Long Term Goals - 09/26/16 1136      PT LONG TERM GOAL #1   Title Patient to show functional strength as being 4+/5 in order to improve mobilty and balance    Time 8   Period Weeks   Status New     PT LONG TERM GOAL #2   Title Patient to be able to maintain tandem stance for at least 20 seconds bilaterally on solid surface and TUG test in 20 seconds with walker in order to show imrpoved balance and reduced fall risk    Time 8   Period Weeks   Status New     PT LONG TERM GOAL #3   Title Paitent to be able to ascend and descend full flight of stairs with U railing and min guard, step to pattern, no unsteadiness or fatigue, in order to improve home and community access    Time 8   Period Weeks   Status New     PT LONG TERM GOAL #4   Title Patient to be able to ambulate at at least 0.48m/s with walker in order to improve access to home and community, also to show general reduced fall risk    Time 8   Period Weeks   Status New                Plan - 09/26/16 1127    Clinical Impression Statement Patient arrives after being discharged from the [facility] after having experienced a series of multiple strokes within the past several months; she had been receiving PT for her hip prior to this and was discharged from this course of care due to change in medical status. Examination reveals significant deficits in gait mechanics and speed, functional strength, functional mobility, and mostly markedly in functional balance skills at this time. Her daughter is very supportive and is living with her but they would like her to be as independent as possible. Recommend skilled PT services to address functional deficits, reduce fall risk, and improve safe  functional independence.    History and Personal Factors relevant to plan of care: multiple strokes within the past several months; recent hip surgery; PT earlier this year    Clinical Presentation Stable   Clinical Presentation due to: multiple CVAs, deconditioning    Clinical Decision Making Low   Rehab Potential Good   Clinical Impairments Affecting Rehab Potential (+) supportive family; (-) hx of fall with serious injury, multiple recent CVAs   PT Frequency 2x / week   PT Duration 8 weeks   PT Treatment/Interventions ADLs/Self Care Home Management;Biofeedback;Cryotherapy;DME Instruction;Gait training;Stair training;Functional mobility training;Therapeutic activities;Therapeutic exercise;Balance training;Neuromuscular re-education;Patient/family education;Manual techniques;Energy conservation;Taping   PT Next Visit Plan review new initial eval and goals; focus on bed mobility, functional strength, balance. Nustep.    PT Home Exercise Plan Eval: bridges, supine hip ABD with red TB, LAQs with red TB    Recommended Other Services OT and possible speech therapy    Consulted and Agree with Plan of Care Patient;Family member/caregiver   Family Member Consulted daughter       Patient will benefit from skilled therapeutic intervention in order to improve the following deficits and impairments:  Abnormal gait, Decreased coordination, Decreased mobility, Decreased activity tolerance, Decreased strength, Decreased balance, Decreased safety awareness, Difficulty walking  Visit Diagnosis: Muscle weakness (generalized) - Plan: PT plan of care cert/re-cert  Difficulty in walking, not elsewhere classified - Plan: PT plan of care cert/re-cert  Unsteadiness on feet - Plan:  PT plan of care cert/re-cert  Other lack of coordination - Plan: PT plan of care cert/re-cert      G-Codes - 64/33/29 1139    Functional Assessment Tool Used (Outpatient Only) Based on skilled clinical assessment of strength,  balance, gait pattern, fall risk    Functional Limitation Mobility: Walking and moving around   Mobility: Walking and Moving Around Current Status (J1884) At least 60 percent but less than 80 percent impaired, limited or restricted   Mobility: Walking and Moving Around Goal Status [phone]) At least 40 percent but less than 60 percent impaired, limited or restricted       Problem List Patient Active Problem List   Diagnosis Date Noted  . Controlled diabetes mellitus type 2 with complications ([place]) 30/16/0109  . Physical deconditioning   . Acute ischemic stroke ([place]) 08/20/2016  . Pressure injury of skin 08/17/2016  . Acute CVA (cerebrovascular accident) ([place]) 08/17/2016  . UTI (urinary tract infection) 08/16/2016  . Constipation 07/10/2016  . Dysphagia as late effect of cerebrovascular accident (CVA) 07/10/2016  . Loss of weight 07/10/2016  . Impaired glucose tolerance 06/05/2016  . Slurred speech   . Hypokalemia 04/13/2016  . Hyperglycemia 04/13/2016  . Essential hypertension 04/13/2016  . Hyperlipidemia 04/13/2016  . S/P right hip fracture 04/12/2016  . Hip fracture ([place]) 04/12/2016  . Fall   . Itching in the vaginal area 10/05/2014  . Chronic myelogenous leukemia (CML), BCR-ABL1-positive ([place])   . GERD (gastroesophageal reflux disease) 12/06/2013  . Leukocytosis 08/06/2013  . Atypical chest pain 08/06/2013  . Chronic diarrhea 08/05/2013  . Pessary maintenance 07/01/2013    Deniece Ree PT, DPT [place] [address] [place], [place], [address] Phone: [phone]   Fax:  [phone]  Name: April Page MRN: [id number] Date of Birth: [birth year]

## 2016-09-26 NOTE — Patient Instructions (Signed)
   BRIDGING  While lying on your back, tighten your lower abdominals, squeeze your buttocks and then raise your buttocks off the floor/bed as creating a "Bridge" with your body.  Repeat 10 times, twice a day.    ELASTIC BAND - SUPINE HIP ABDUCTION  While lying on your back, slowly bring your leg out to the side. Keep  your knee straight the entire time. The band should be around your ankles.  Make sure your toes are pointing straight up to the ceiling, not rolling out to the side.  Repeat 5-10 times each leg, twice a day.    Seated LAQ with band  Start sitting with band under arch of unaffected leg and around ankle of affected leg. Straighten your leg and slowly return to starting position keeping knees tight together.  Make sure you are kicking your leg all the way out straight.  Repeat 5-10 times each leg, twice a day.

## 2016-09-27 ENCOUNTER — Ambulatory Visit (HOSPITAL_COMMUNITY): Payer: Medicare HMO | Admitting: Occupational Therapy

## 2016-09-27 ENCOUNTER — Encounter (HOSPITAL_COMMUNITY): Payer: Self-pay | Admitting: Occupational Therapy

## 2016-09-27 DIAGNOSIS — R278 Other lack of coordination: Secondary | ICD-10-CM

## 2016-09-27 DIAGNOSIS — R29898 Other symptoms and signs involving the musculoskeletal system: Secondary | ICD-10-CM | POA: Diagnosis not present

## 2016-09-27 DIAGNOSIS — R2681 Unsteadiness on feet: Secondary | ICD-10-CM | POA: Diagnosis not present

## 2016-09-27 DIAGNOSIS — R262 Difficulty in walking, not elsewhere classified: Secondary | ICD-10-CM | POA: Diagnosis not present

## 2016-09-27 DIAGNOSIS — M6281 Muscle weakness (generalized): Secondary | ICD-10-CM

## 2016-09-27 NOTE — Therapy (Signed)
April Page, [place], [address] Phone: [phone]   Fax:  [phone]  Occupational Therapy Evaluation  Patient Details  Name: April Page MRN: [id number] Date of Birth: June 19, 1945 Referring Provider: Dr. Redmond School  Encounter Date: 09/27/2016      OT End of Session - 09/27/16 1547    Visit Number 1   Number of Visits 16   Date for OT Re-Evaluation 11/26/16  mini-reassessment 10/25/2016   Authorization Type [employer]   Authorization Time Period Before 10th visit   Authorization - Visit Number 1   Authorization - Number of Visits 10   OT Start Time 1300   OT Stop Time 1345   OT Time Calculation (min) 45 min   Activity Tolerance Patient tolerated treatment well   Behavior During Therapy [facility] for tasks assessed/performed      Past Medical History:  Diagnosis Date  . Arthritis    stiff knees  . Blood dyscrasia   . Cancer ([place])   . CVA (cerebral vascular accident) ([place])   . GERD (gastroesophageal reflux disease)   . H/O: CML (chronic myeloid leukemia)   . Hyperlipidemia   . Hyperlipidemia   . Hypertension   . Obesity   . Pessary maintenance 07/01/2013    Past Surgical History:  Procedure Laterality Date  . ABDOMINAL HYSTERECTOMY    . CHOLECYSTECTOMY    . COLONOSCOPY  06/09/2002   OVZ:CHYIFOYD hemorrhoids; otherwise normal rectum, colon   . COLONOSCOPY N/A 08/25/2013   XAJ:OINOMVE diverticulosis. Single colonic polyp-removed  s/p segmental biopsy and stool sample. random colon bx negative. +benign leiomyoma.  . cystocele/rectocele repair  2009  . ESOPHAGOGASTRODUODENOSCOPY  06/09/2002   HMC:NOBSJG upper gastrointestinal tract s/p  54-French Maloney dilator  . FEMUR IM NAIL Right 04/12/2016   Procedure: INTRAMEDULLARY (IM) NAIL FEMORAL;  Surgeon: Nicholes Stairs, MD;  Location: [facility];  Service: Orthopedics;  Laterality: Right;  . HERNIA REPAIR    . LOOP RECORDER INSERTION N/A 08/23/2016    Procedure: Loop Recorder Insertion;  Surgeon: Constance Haw, MD;  Location: [place] [facility];  Service: Cardiovascular;  Laterality: N/A;  . OOPHORECTOMY    . TEE WITHOUT CARDIOVERSION N/A 08/23/2016   Procedure: TRANSESOPHAGEAL ECHOCARDIOGRAM (TEE);  Surgeon: Fay Records, MD;  Location: [facility];  Service: Cardiovascular;  Laterality: N/A;    There were no vitals filed for this visit.      Subjective Assessment - 09/27/16 1543    Subjective  S: I have church ladies that stay with me.    Pertinent History Pt is a 71 y/o female s/p right and left CVAs in March and in June of 2018, on discharge from [facility] pt spent 3 weeks at [facility] and received rehab services. Pt is now home, has 24/7 supervision from church friends and family, who also assist with ADL completion. Pt was referred to occupational therapy for evaluation and treatment by Dr. Redmond School.    Patient Stated Goals To be independent again.   Currently in Pain? No/denies           [facility] OT Assessment - 09/27/16 1256      Assessment   Diagnosis s/p CVA   Referring Provider Dr. Redmond School   Onset Date --  March-June 2018   Prior Therapy PT/OT/SLP at [facility] for approximately 3 weeks     Precautions   Precautions Fall     Restrictions   Weight Bearing Restrictions No  Balance Screen   Has the patient fallen in the past 6 months No   Has the patient had a decrease in activity level because of a fear of falling?  No   Is the patient reluctant to leave their home because of a fear of falling?  No     Home  Environment   Family/patient expects to be discharged to: Private residence   Living Arrangements Alone   Available Help at Discharge Friend(s)   Additional Comments Pt has 24/7 supervision     Prior Function   Level of Independence Needs assistance with ADLs   Vocation Retired   Leisure watching tv, eating, church     ADL   Eating/Feeding Set up   Grooming Min guard   Lower Body Bathing  Maximal assistance  sponge bathes, although has transfer bench   Upper Body Dressing Min guard   Lower Body Dressing Moderate assistance   Toilet Transfer Modified independent   Toileting - Clothing Manipulation Modified independent   Aeronautical engineer bench   ADL comments Pt reports she is able to complete some tasks, however requires assistance due to fatigue and weakness. Pt has difficulty using RUE as dominant, is unable to maintain hold on weighted objects, and has difficulty with coordination tasks     Written Expression   Dominant Hand Right     Vision - History   Baseline Vision Wears glasses only for reading     Cognition   Overall Cognitive Status Within Functional Limits for tasks assessed     Sensation   Light Touch Appears Intact     Coordination   9 Hole Peg Test Right;Left   Left 9 Hole Peg Test 48.51"   Box and Blocks 1'21"     ROM / Strength   AROM / PROM / Strength Strength     Strength   Strength Assessment Site Shoulder;Elbow;Hand   Right/Left Shoulder Right;Left   Right Shoulder Flexion 4/5   Right Shoulder ABduction 3+/5   Right Shoulder Internal Rotation 4-/5   Right Shoulder External Rotation 4-/5   Left Shoulder Flexion 4/5   Left Shoulder ABduction 4/5   Left Shoulder Internal Rotation 4/5   Left Shoulder External Rotation 4-/5   Right/Left Elbow Right;Left   Right Elbow Flexion 4-/5   Right Elbow Extension 4-/5   Left Elbow Flexion 4/5   Left Elbow Extension 4/5   Right/Left hand Right;Left   Right Hand Gross Grasp Functional   Right Hand Grip (lbs) 20   Right Hand Lateral Pinch 7 lbs   Right Hand 3 Point Pinch 2 lbs   Left Hand Gross Grasp Functional   Left Hand Grip (lbs) 30   Left Hand Lateral Pinch 10 lbs   Left Hand 3 Point Pinch 7 lbs                         OT Education - 09/27/16 1432    Education provided Yes   Education Details red theraputty  grip/strengthening, fine motor coordination exercises   Person(s) Educated Patient   Methods Explanation;Demonstration;Handout   Comprehension Verbalized understanding;Returned demonstration          OT Short Term Goals - 09/27/16 1608      OT SHORT TERM GOAL #1   Title Pt will be provided with and educated on HEP for improved independence during ADL completion.    Time 4  Period Weeks   Status New     OT SHORT TERM GOAL #2   Title Pt will improve independence in ADL completion by decreasing level of assistance required during bathing tasks from mod/max to minimal.    Time 4   Period Weeks   Status New     OT SHORT TERM GOAL #3   Title Pt will improve BUE grip strength by 6# and pinch strength by 2# to improve ability to grasp and hold grooming items.    Time 4   Period Weeks   Status New     OT SHORT TERM GOAL #4   Title Pt will improve right fine motor coordination required for ADL completion by completing 9 hole peg test in less than 1'.    Time 4   Period Weeks   Status New           OT Long Term Goals - 09/27/16 1613      OT LONG TERM GOAL #1   Title Pt will return to highest level of functioning and independence in B/ADL tasks, completing tasks at supervision level or better.    Time 8   Period Weeks   Status New     OT LONG TERM GOAL #2   Title Pt will improve BUE strength to 4+/5 to improve ability to complete ADL tasks with greater independence.    Time 8   Period Weeks   Status New     OT LONG TERM GOAL #3   Title Pt will improve BUE grip strength by 12# and pinch strength by 4# to improve ability to hold and utilize items for light meal preparation.    Time 8   Period Weeks   Status New     OT LONG TERM GOAL #4   Title Pt will improve right fine motor coordination by completing 9 hole peg test in 45" or less.    Time 8   Period Weeks   Status New               Plan - 09/27/16 1547    Clinical Impression Statement A: Pt is a 71  y/o female s/p multiple CVAs in March and June 2018, recently discharged from [facility], presenting with generalized weakness, RUE weakness greater than LUE. Pt is having difficulty with B/ADL completion, grasping and holding objects, and with coordination tasks using RUE as dominant. Pt provided with grip and pinch strengthening and fine motor coordination HEP.    Occupational Profile and client history currently impacting functional performance Pt was independent in B/ADL completion prior to CVA and is motivated to be able to live alone and complete desired ADLs and leisure tasks without assistance.    Occupational performance deficits (Please refer to evaluation for details): ADL's;IADL's;Leisure;Social Participation   Rehab Potential Good   OT Frequency 2x / week   OT Duration 8 weeks   OT Treatment/Interventions Self-care/ADL training;Therapeutic exercise;Patient/family education;Neuromuscular education;Therapeutic activities   Plan P: Pt will benefit from skilled OT services to improve independence and safety during ADL completion, strength in BUE including grip and pinch, and fine motor coordination to improve functional use of BUE during daily tasks. Treatment plan: general BUE strengthening, grip and pinch strengthening of BUE, fine motor coordination tasks, ADL training, DME/AE training as necessary    Clinical Decision Making Limited treatment options, no task modification necessary   OT Home Exercise Plan 7/20: red theraputty for grip and pinch strengthening, fine motor coordination tasks   Consulted  and Agree with Plan of Care Patient      Patient will benefit from skilled therapeutic intervention in order to improve the following deficits and impairments:  Decreased activity tolerance, Decreased strength, Impaired UE functional use, Decreased coordination  Visit Diagnosis: Other symptoms and signs involving the musculoskeletal system  Other lack of coordination  Muscle weakness  (generalized)      G-Codes - 10/06/16 1616    Functional Assessment Tool Used (Outpatient only) clinical judgement   Functional Limitation Self care   Self Care Current Status (B2841) At least 60 percent but less than 80 percent impaired, limited or restricted   Self Care Goal Status (L2440) At least 40 percent but less than 60 percent impaired, limited or restricted      Problem List Patient Active Problem List   Diagnosis Date Noted  . Controlled diabetes mellitus type 2 with complications ([place]) 01/05/2535  . Physical deconditioning   . Acute ischemic stroke ([place]) 08/20/2016  . Pressure injury of skin 08/17/2016  . Acute CVA (cerebrovascular accident) ([place]) 08/17/2016  . UTI (urinary tract infection) 08/16/2016  . Constipation 07/10/2016  . Dysphagia as late effect of cerebrovascular accident (CVA) 07/10/2016  . Loss of weight 07/10/2016  . Impaired glucose tolerance 06/05/2016  . Slurred speech   . Hypokalemia 04/13/2016  . Hyperglycemia 04/13/2016  . Essential hypertension 04/13/2016  . Hyperlipidemia 04/13/2016  . S/P right hip fracture 04/12/2016  . Hip fracture ([place]) 04/12/2016  . Fall   . Itching in the vaginal area 10/05/2014  . Chronic myelogenous leukemia (CML), BCR-ABL1-positive ([place])   . GERD (gastroesophageal reflux disease) 12/06/2013  . Leukocytosis 08/06/2013  . Atypical chest pain 08/06/2013  . Chronic diarrhea 08/05/2013  . Pessary maintenance 07/01/2013   Guadelupe Sabin, OTR/L  [phone] 2016/10/06, 4:18 PM  [place] [address] [place], [place], [address] Phone: [phone]   Fax:  [phone]  Name: JESSEE NEWNAM MRN: [id number] Date of Birth: 17-Oct-1945

## 2016-09-27 NOTE — Patient Instructions (Signed)
Home Exercises Program Theraputty Exercises  Do the following exercises 2 times a day using your affected hand.  1. Roll putty into a ball.  2. Make into a pancake.  3. Roll putty into a roll.  4. Pinch along log with first finger and thumb.   5. Make into a ball.  6. Roll it back into a log.   7. Pinch using thumb and side of first finger.  8. Roll into a ball, then flatten into a pancake.  9. Using your fingers, make putty into a mountain.    1) Towel crunch Place a small towel on a firm table top. Flatten out the towel and then place your hand on one end of it.  Next, flex your fingers 2-5 (index finger through pinky finger) as you pull the towel towards your hand.    2) Digit composite flexion/adduction (make a fist) Hold your hand up as shown. Open and close your hand into a fist and repeat. If you cannot make a full fist, then make a partial fist.    3) Thumb/finger opposition Touch the tip of the thumb to each fingertip one by one. Extend fingers fully after they are touched.      4) Finger Taps Start with the hand flat and fingers slightly spread.  One at a time, starting with the thumb, lift each finger up separately.     5) Digit Abduction/Adduction Hold hand palm down flat on table. Spread your fingers apart and back together.

## 2016-10-01 ENCOUNTER — Ambulatory Visit (HOSPITAL_COMMUNITY): Payer: Medicare HMO

## 2016-10-01 ENCOUNTER — Ambulatory Visit (HOSPITAL_COMMUNITY): Payer: Medicare HMO | Admitting: Physical Therapy

## 2016-10-01 ENCOUNTER — Encounter (HOSPITAL_COMMUNITY): Payer: Self-pay

## 2016-10-01 DIAGNOSIS — R29898 Other symptoms and signs involving the musculoskeletal system: Secondary | ICD-10-CM | POA: Diagnosis not present

## 2016-10-01 DIAGNOSIS — R262 Difficulty in walking, not elsewhere classified: Secondary | ICD-10-CM

## 2016-10-01 DIAGNOSIS — M6281 Muscle weakness (generalized): Secondary | ICD-10-CM

## 2016-10-01 DIAGNOSIS — R278 Other lack of coordination: Secondary | ICD-10-CM | POA: Diagnosis not present

## 2016-10-01 DIAGNOSIS — R2681 Unsteadiness on feet: Secondary | ICD-10-CM | POA: Diagnosis not present

## 2016-10-01 NOTE — Therapy (Signed)
[facility] [place], [place], [address] Phone: [phone]   Fax:  [phone]  Physical Therapy Treatment  Patient Details  Name: April Page MRN: [id number] Date of Birth: 1945-03-27 Referring Provider: [employer]   Encounter Date: 10/01/2016      PT End of Session - 10/01/16 1201    Visit Number 2   Number of Visits 17   Date for PT Re-Evaluation 10/24/16   Authorization Type [employer] H[employer]   Authorization Time Period 09/26/16 to 11/27/16   Authorization - Visit Number 2   Authorization - Number of Visits 10   PT Start Time 1120   PT Stop Time 1158   PT Time Calculation (min) 38 min   Activity Tolerance Patient tolerated treatment well   Behavior During Therapy [facility] for tasks assessed/performed      Past Medical History:  Diagnosis Date  . Arthritis    stiff knees  . Blood dyscrasia   . Cancer ([place])   . CVA (cerebral vascular accident) ([place])   . GERD (gastroesophageal reflux disease)   . H/O: CML (chronic myeloid leukemia)   . Hyperlipidemia   . Hyperlipidemia   . Hypertension   . Obesity   . Pessary maintenance 07/01/2013    Past Surgical History:  Procedure Laterality Date  . ABDOMINAL HYSTERECTOMY    . CHOLECYSTECTOMY    . COLONOSCOPY  06/09/2002   ACZ:YSAYTKZS hemorrhoids; otherwise normal rectum, colon   . COLONOSCOPY N/A 08/25/2013   WFU:XNATFTD diverticulosis. Single colonic polyp-removed  s/p segmental biopsy and stool sample. random colon bx negative. +benign leiomyoma.  . cystocele/rectocele repair  2009  . ESOPHAGOGASTRODUODENOSCOPY  06/09/2002   DUK:GURKYH upper gastrointestinal tract s/p  54-French Maloney dilator  . FEMUR IM NAIL Right 04/12/2016   Procedure: INTRAMEDULLARY (IM) NAIL FEMORAL;  Surgeon: Nicholes Stairs, MD;  Location: [facility];  Service: Orthopedics;  Laterality: Right;  . HERNIA REPAIR    . LOOP RECORDER INSERTION N/A 08/23/2016   Procedure: Loop Recorder  Insertion;  Surgeon: Constance Haw, MD;  Location: [facility];  Service: Cardiovascular;  Laterality: N/A;  . OOPHORECTOMY    . TEE WITHOUT CARDIOVERSION N/A 08/23/2016   Procedure: TRANSESOPHAGEAL ECHOCARDIOGRAM (TEE);  Surgeon: Fay Records, MD;  Location: [facility];  Service: Cardiovascular;  Laterality: N/A;    There were no vitals filed for this visit.      Subjective Assessment - 10/01/16 1125    Subjective Patient arrives stating she is doing well, no major changes since last tmie, no falls    Pertinent History HTN, CVA March 2018, DM, R hip fractures, leukemia, R femur IM nail s/p fall 04/12/16, multiple CVAs this year    Patient Stated Goals to regain independence    Currently in Pain? No/denies                         [facility] Adult PT Treatment/Exercise - 10/01/16 0001      Knee/Hip Exercises: Standing   Heel Raises Both;1 set;15 reps   Heel Raises Limitations heel, toe raises 1x10 with mod cues    Knee Flexion Both;1 set;10 reps   Forward Step Up Both;1 set;10 reps   Forward Step Up Limitations 4 inch box      Knee/Hip Exercises: Supine   Bridges Both;1 set;15 reps   Straight Leg Raises Both;1 set;10 reps   Other Supine Knee/Hip Exercises supine to sit/sit to supine  training      Knee/Hip Exercises: Sidelying   Hip ABduction Both;1 set;10 reps     Knee/Hip Exercises: Prone   Hamstring Curl 1 set;10 reps   Hip Extension Both;1 set;10 reps   Hip Extension Limitations attempted, unable due to  ROM limitations                 PT Education - 10/01/16 1201    Education provided Yes   Education Details review of initial eval/goals    Person(s) Educated Patient   Methods Explanation;Handout   Comprehension Verbalized understanding          PT Short Term Goals - 09/26/16 1132      PT SHORT TERM GOAL #1   Title Patient to be able to complete all functional mobility, including bed mobility, with Mod(I) in order to improve  functional independence    Time 4   Period Weeks   Status New     PT SHORT TERM GOAL #2   Title Patient to be able to ambulate at gait speed of at least 0.47m/s with walker in order to improve access to home and community    Baseline 0.79m/s at eval    Time 4   Period Weeks   Status New     PT SHORT TERM GOAL #3   Title Patient to be able to complete 5 time sit to stand test in 30 seconds with UEs in order to show improved functional mobility and strength    Time 4   Period Weeks   Status New     PT SHORT TERM GOAL #4   Title Patient to be able to correctly and consistently perform HEP at least 5 days per week, to be updated as appropriate    Time 1   Period Weeks   Status New           PT Long Term Goals - 09/26/16 1136      PT LONG TERM GOAL #1   Title Patient to show functional strength as being 4+/5 in order to improve mobilty and balance    Time 8   Period Weeks   Status New     PT LONG TERM GOAL #2   Title Patient to be able to maintain tandem stance for at least 20 seconds bilaterally on solid surface and TUG test in 20 seconds with walker in order to show imrpoved balance and reduced fall risk    Time 8   Period Weeks   Status New     PT LONG TERM GOAL #3   Title Paitent to be able to ascend and descend full flight of stairs with U railing and min guard, step to pattern, no unsteadiness or fatigue, in order to improve home and community access    Time 8   Period Weeks   Status New     PT LONG TERM GOAL #4   Title Patient to be able to ambulate at at least 0.48m/s with walker in order to improve access to home and community, also to show general reduced fall risk    Time 8   Period Weeks   Status New               Plan - 10/01/16 1203    Clinical Impression Statement Reviewed initial eval/goals and focused today on functional strength and bed mobility. Patient demonstrates quite a bit of functional weakness and muscle tightness as well as mild  difficulty with consistency with  bed mobility although this did improve with PT intervention, will require ongoing follow-up for consistency.    Rehab Potential Good   Clinical Impairments Affecting Rehab Potential (+) supportive family; (-) hx of fall with serious injury, multiple recent CVAs   PT Frequency 2x / week   PT Duration 8 weeks   PT Treatment/Interventions ADLs/Self Care Home Management;Biofeedback;Cryotherapy;DME Instruction;Gait training;Stair training;Functional mobility training;Therapeutic activities;Therapeutic exercise;Balance training;Neuromuscular re-education;Patient/family education;Manual techniques;Energy conservation;Taping   PT Next Visit Plan increase height of walker to reduce flexed posture with gait; continue bed mobility, strength, balance    PT Home Exercise Plan Eval: bridges, supine hip ABD with red TB, LAQs with red TB    Consulted and Agree with Plan of Care Patient      Patient will benefit from skilled therapeutic intervention in order to improve the following deficits and impairments:  Abnormal gait, Decreased coordination, Decreased mobility, Decreased activity tolerance, Decreased strength, Decreased balance, Decreased safety awareness, Difficulty walking  Visit Diagnosis: Other symptoms and signs involving the musculoskeletal system  Other lack of coordination  Muscle weakness (generalized)  Difficulty in walking, not elsewhere classified  Unsteadiness on feet     Problem List Patient Active Problem List   Diagnosis Date Noted  . Controlled diabetes mellitus type 2 with complications ([place]) 42/39/5320  . Physical deconditioning   . Acute ischemic stroke ([place]) 08/20/2016  . Pressure injury of skin 08/17/2016  . Acute CVA (cerebrovascular accident) ([place]) 08/17/2016  . UTI (urinary tract infection) 08/16/2016  . Constipation 07/10/2016  . Dysphagia as late effect of cerebrovascular accident (CVA) 07/10/2016  . Loss of weight 07/10/2016  .  Impaired glucose tolerance 06/05/2016  . Slurred speech   . Hypokalemia 04/13/2016  . Hyperglycemia 04/13/2016  . Essential hypertension 04/13/2016  . Hyperlipidemia 04/13/2016  . S/P right hip fracture 04/12/2016  . Hip fracture ([place]) 04/12/2016  . Fall   . Itching in the vaginal area 10/05/2014  . Chronic myelogenous leukemia (CML), BCR-ABL1-positive ([place])   . GERD (gastroesophageal reflux disease) 12/06/2013  . Leukocytosis 08/06/2013  . Atypical chest pain 08/06/2013  . Chronic diarrhea 08/05/2013  . Pessary maintenance 07/01/2013    Deniece Ree PT, DPT [facility] [address] [place], [place], [address] Phone: [phone]   Fax:  [phone]  Name: April Page MRN: [id number] Date of Birth: 1945/05/16

## 2016-10-01 NOTE — Therapy (Signed)
[facility] [place], [place], [address] Phone: [phone]   Fax:  [phone]  Occupational Therapy Treatment  Patient Details  Name: April Page MRN: [id number] Date of Birth: 1945/04/02 Referring Provider: Dr. [employer]  Encounter Date: 10/01/2016      OT End of Session - 10/01/16 1318    Visit Number 2   Number of Visits 16   Date for OT Re-Evaluation 11/26/16  mini-reassessment 10/25/2016   Authorization Type [employer]   Authorization Time Period Before 10th visit   Authorization - Visit Number 2   Authorization - Number of Visits 10   OT Start Time 1200   OT Stop Time 1247   OT Time Calculation (min) 47 min   Activity Tolerance Patient tolerated treatment well   Behavior During Therapy [facility] for tasks assessed/performed      Past Medical History:  Diagnosis Date  . Arthritis    stiff knees  . Blood dyscrasia   . Cancer ([place])   . CVA (cerebral vascular accident) ([place])   . GERD (gastroesophageal reflux disease)   . H/O: CML (chronic myeloid leukemia)   . Hyperlipidemia   . Hyperlipidemia   . Hypertension   . Obesity   . Pessary maintenance 07/01/2013    Past Surgical History:  Procedure Laterality Date  . ABDOMINAL HYSTERECTOMY    . CHOLECYSTECTOMY    . COLONOSCOPY  06/09/2002   RJJ:OACZYSAY hemorrhoids; otherwise normal rectum, colon   . COLONOSCOPY N/A 08/25/2013   TKZ:SWFUXNA diverticulosis. Single colonic polyp-removed  s/p segmental biopsy and stool sample. random colon bx negative. +benign leiomyoma.  . cystocele/rectocele repair  2009  . ESOPHAGOGASTRODUODENOSCOPY  06/09/2002   TFT:DDUKGU upper gastrointestinal tract s/p  54-French Maloney dilator  . FEMUR IM NAIL Right 04/12/2016   Procedure: INTRAMEDULLARY (IM) NAIL FEMORAL;  Surgeon: Nicholes Stairs, MD;  Location: [facility];  Service: Orthopedics;  Laterality: Right;  . HERNIA REPAIR    . LOOP RECORDER INSERTION N/A 08/23/2016    Procedure: Loop Recorder Insertion;  Surgeon: Constance Haw, MD;  Location: [facility];  Service: Cardiovascular;  Laterality: N/A;  . OOPHORECTOMY    . TEE WITHOUT CARDIOVERSION N/A 08/23/2016   Procedure: TRANSESOPHAGEAL ECHOCARDIOGRAM (TEE);  Surgeon: Fay Records, MD;  Location: [facility];  Service: Cardiovascular;  Laterality: N/A;    There were no vitals filed for this visit.      Subjective Assessment - 10/01/16 1204    Subjective  S: I don't like using the putty because of the way it feels.   Currently in Pain? No/denies            [facility] OT Assessment - 10/01/16 1204      Assessment   Diagnosis s/p CVA     Precautions   Precautions Fall                  OT Treatments/Exercises (OP) - 10/01/16 1205      Exercises   Exercises Shoulder;Elbow     Shoulder Exercises: Seated   Protraction Strengthening;10 reps   Protraction Weight (lbs) 1   Horizontal ABduction Strengthening;10 reps   Horizontal ABduction Weight (lbs) 1   External Rotation Strengthening;10 reps   External Rotation Weight (lbs) 1   Internal Rotation Strengthening;10 reps   Internal Rotation Weight (lbs) 1   Flexion Strengthening;10 reps   Flexion Weight (lbs) 1   Abduction Strengthening;10 reps   ABduction Weight (lbs) 1  Elbow Exercises   Bar Weights/Barbell (Elbow Flexion) 2 lbs  10X   Elbow Extension Strengthening;10 reps  2#   Forearm Supination Strengthening;10 reps  2#   Forearm Pronation Strengthening;10 reps  2#   Wrist Flexion Strengthening;10 reps   Bar Weights/Barbell (Wrist Flexion) 2 lbs   Wrist Extension Strengthening;10 reps   Bar Weights/Barbell (Wrist Extension) 2 lbs     Additional Elbow Exercises   Theraputty Flatten;Roll;Grip;Pinch  PVC pipe used to create 15 circles with each hand.   Theraputty - Flatten yellow- in standing   Theraputty - Roll yellow   Theraputty - Grip yellow   Hand Gripper with Large Beads right hand: 22# all  beads; left hand 22# all beads   Hand Gripper with Medium Beads right hand: 22# all beads; left hand 22# all beads   Hand Gripper with Small Beads right hand: 22# all beads; left hand 22# all beads     Neurological Re-education Exercises   Theraputty - Pinch yellow                OT Education - 10/01/16 1204    Education provided Yes   Education Details provided pt with evaluation papers and discussed goals   Person(s) Educated Patient   Methods Explanation;Handout   Comprehension Verbalized understanding          OT Short Term Goals - 10/01/16 1319      OT SHORT TERM GOAL #1   Title Pt will be provided with and educated on HEP for improved independence during ADL completion.    Time 4   Period Weeks   Status On-going     OT SHORT TERM GOAL #2   Title Pt will improve independence in ADL completion by decreasing level of assistance required during bathing tasks from mod/max to minimal.    Time 4   Period Weeks   Status On-going     OT SHORT TERM GOAL #3   Title Pt will improve BUE grip strength by 6# and pinch strength by 2# to improve ability to grasp and hold grooming items.    Time 4   Period Weeks   Status On-going     OT SHORT TERM GOAL #4   Title Pt will improve right fine motor coordination required for ADL completion by completing 9 hole peg test in less than 1'.    Time 4   Period Weeks   Status On-going           OT Long Term Goals - 10/01/16 1319      OT LONG TERM GOAL #1   Title Pt will return to highest level of functioning and independence in B/ADL tasks, completing tasks at supervision level or better.    Time 8   Period Weeks   Status On-going     OT LONG TERM GOAL #2   Title Pt will improve BUE strength to 4+/5 to improve ability to complete ADL tasks with greater independence.    Time 8   Period Weeks   Status On-going     OT LONG TERM GOAL #3   Title Pt will improve BUE grip strength by 12# and pinch strength by 4# to improve  ability to hold and utilize items for light meal preparation.    Time 8   Period Weeks   Status On-going     OT LONG TERM GOAL #4   Title Pt will improve right fine motor coordination by completing 9 hole peg test in  45" or less.    Time 8   Period Weeks   Status On-going               Plan - 10/01/16 1320    Clinical Impression Statement A: Pt completed BUE strengthening with moderate VC needed for form and technique as well as physical assistance from therapist needed for wrist flexion/extension. Significant fatigue present during strengthening exercises. Pt completed hand gripper one time with each hand for all beads with intermittent rest breaks needed. Pt expressed that she highly dislikes using theraputty due to the texture of it and wishes to not use it every session if possible.   Plan P: Continue with BUE strengthening. Do not use theraputty this session. Complete small peg board activity.      Patient will benefit from skilled therapeutic intervention in order to improve the following deficits and impairments:  Decreased activity tolerance, Decreased strength, Impaired UE functional use, Decreased coordination  Visit Diagnosis: Other symptoms and signs involving the musculoskeletal system  Other lack of coordination    Problem List Patient Active Problem List   Diagnosis Date Noted  . Controlled diabetes mellitus type 2 with complications ([place]) 76/81/1572  . Physical deconditioning   . Acute ischemic stroke ([place]) 08/20/2016  . Pressure injury of skin 08/17/2016  . Acute CVA (cerebrovascular accident) ([place]) 08/17/2016  . UTI (urinary tract infection) 08/16/2016  . Constipation 07/10/2016  . Dysphagia as late effect of cerebrovascular accident (CVA) 07/10/2016  . Loss of weight 07/10/2016  . Impaired glucose tolerance 06/05/2016  . Slurred speech   . Hypokalemia 04/13/2016  . Hyperglycemia 04/13/2016  . Essential hypertension 04/13/2016  . Hyperlipidemia  04/13/2016  . S/P right hip fracture 04/12/2016  . Hip fracture ([place]) 04/12/2016  . Fall   . Itching in the vaginal area 10/05/2014  . Chronic myelogenous leukemia (CML), BCR-ABL1-positive ([place])   . GERD (gastroesophageal reflux disease) 12/06/2013  . Leukocytosis 08/06/2013  . Atypical chest pain 08/06/2013  . Chronic diarrhea 08/05/2013  . Pessary maintenance 07/01/2013    Luther Hearing, OT Student [phone] 10/01/2016, 1:38 PM  [facility] [address] [place], [place], [address] Phone: [phone]   Fax:  [phone]  Name: April Page MRN: [id number] Date of Birth: [birth year]      This qualified practitioner was present in the room guiding the student in service delivery. Therapy student was participating in the provision of services, and the practitioner was not engaged in treating another patient or doing other tasks at the same time.  Ailene Ravel, OTR/L,CBIS  [phone]

## 2016-10-02 ENCOUNTER — Ambulatory Visit (HOSPITAL_COMMUNITY): Payer: Medicare HMO | Admitting: Occupational Therapy

## 2016-10-02 ENCOUNTER — Ambulatory Visit (HOSPITAL_COMMUNITY): Payer: Medicare HMO

## 2016-10-02 ENCOUNTER — Ambulatory Visit (INDEPENDENT_AMBULATORY_CARE_PROVIDER_SITE_OTHER): Payer: Medicare HMO | Admitting: Adult Health

## 2016-10-02 ENCOUNTER — Encounter: Payer: Self-pay | Admitting: Adult Health

## 2016-10-02 ENCOUNTER — Ambulatory Visit: Payer: Commercial Managed Care - HMO | Admitting: Adult Health

## 2016-10-02 ENCOUNTER — Telehealth (HOSPITAL_COMMUNITY): Payer: Self-pay | Admitting: Internal Medicine

## 2016-10-02 VITALS — BP 140/90 | HR 97 | Ht 64.0 in | Wt 151.0 lb

## 2016-10-02 DIAGNOSIS — Z4689 Encounter for fitting and adjustment of other specified devices: Secondary | ICD-10-CM

## 2016-10-02 NOTE — Progress Notes (Signed)
Subjective:     Patient ID: April Page, female   DOB: [birth year], 71 y.o.   MRN: [id number]  HPI April Page is a 71 year old black female in for her pessary removal and cleaning and reinsertion.She fractured a hip in February and had a stroke in March, she is walking with a walker.  PCP is Dr Gerarda Fraction.   Review of Systems No discharge,itching or odor Reviewed past medical,surgical, social and family history. Reviewed medications and allergies.     Objective:   Physical Exam BP 140/90 (BP Location: Left Arm, Patient Position: Sitting, Cuff Size: Normal)   Pulse 97   Ht 5\' 4"  (1.626 m)   Wt 151 lb (68.5 kg)   BMI 25.92 kg/m    Skin warm and dry, pessary removed and cleansed with soap and water, no lesions or soreness in vagina, tissues pale, no odor, pessary reinserted.  Assessment:     Pessary maintenance      Plan:     Return in 6 months for pessary maintenance, or sooner if needed

## 2016-10-02 NOTE — Telephone Encounter (Signed)
10/02/16  caller just said that she needed to cx today's appointments - no reason was given

## 2016-10-04 ENCOUNTER — Ambulatory Visit: Payer: Medicare HMO | Admitting: Obstetrics & Gynecology

## 2016-10-04 LAB — BCR-ABL1, CML/ALL, PCR, QUANT

## 2016-10-07 LAB — CUP PACEART REMOTE DEVICE CHECK
Date Time Interrogation Session: 20180715214426
MDC IDC PG IMPLANT DT: 20180615

## 2016-10-08 ENCOUNTER — Ambulatory Visit (HOSPITAL_COMMUNITY): Payer: Medicare HMO | Admitting: Physical Therapy

## 2016-10-08 DIAGNOSIS — R278 Other lack of coordination: Secondary | ICD-10-CM | POA: Diagnosis not present

## 2016-10-08 DIAGNOSIS — R262 Difficulty in walking, not elsewhere classified: Secondary | ICD-10-CM

## 2016-10-08 DIAGNOSIS — R2681 Unsteadiness on feet: Secondary | ICD-10-CM

## 2016-10-08 DIAGNOSIS — M6281 Muscle weakness (generalized): Secondary | ICD-10-CM

## 2016-10-08 DIAGNOSIS — R29898 Other symptoms and signs involving the musculoskeletal system: Secondary | ICD-10-CM | POA: Diagnosis not present

## 2016-10-08 NOTE — Therapy (Signed)
[place], [place], [address] Phone: [phone]   Fax:  [phone]  Physical Therapy Treatment  Patient Details  Name: April Page MRN: [id number] Date of Birth: 1945-04-18 Referring Provider: [employer]   Encounter Date: 10/08/2016      PT End of Session - 10/08/16 0943    Visit Number 3   Number of Visits 17   Date for PT Re-Evaluation 10/24/16   Authorization Type [employer] H[employer]   Authorization Time Period 09/26/16 to 11/27/16   Authorization - Visit Number 3   Authorization - Number of Visits 10   PT Start Time 0909  patient arrived late    PT Stop Time 0940  patient had to use bathroom    PT Time Calculation (min) 31 min   Activity Tolerance Patient tolerated treatment well   Behavior During Therapy [facility] for tasks assessed/performed      Past Medical History:  Diagnosis Date  . Arthritis    stiff knees  . Blood dyscrasia   . Cancer ([place])   . CVA (cerebral vascular accident) ([place])   . GERD (gastroesophageal reflux disease)   . H/O: CML (chronic myeloid leukemia)   . Hyperlipidemia   . Hyperlipidemia   . Hypertension   . Obesity   . Pessary maintenance 07/01/2013  . Stroke ([place])    x 3    Past Surgical History:  Procedure Laterality Date  . ABDOMINAL HYSTERECTOMY    . CHOLECYSTECTOMY    . COLONOSCOPY  06/09/2002   CBS:WHQPRFFM hemorrhoids; otherwise normal rectum, colon   . COLONOSCOPY N/A 08/25/2013   BWG:YKZLDJT diverticulosis. Single colonic polyp-removed  s/p segmental biopsy and stool sample. random colon bx negative. +benign leiomyoma.  . cystocele/rectocele repair  2009  . ESOPHAGOGASTRODUODENOSCOPY  06/09/2002   TSV:XBLTJQ upper gastrointestinal tract s/p  54-French Maloney dilator  . FEMUR IM NAIL Right 04/12/2016   Procedure: INTRAMEDULLARY (IM) NAIL FEMORAL;  Surgeon: Nicholes Stairs, MD;  Location: [place];  Service: Orthopedics;  Laterality: Right;  . HERNIA  REPAIR    . LOOP RECORDER INSERTION N/A 08/23/2016   Procedure: Loop Recorder Insertion;  Surgeon: Constance Haw, MD;  Location: [place] [facility];  Service: Cardiovascular;  Laterality: N/A;  . OOPHORECTOMY    . TEE WITHOUT CARDIOVERSION N/A 08/23/2016   Procedure: TRANSESOPHAGEAL ECHOCARDIOGRAM (TEE);  Surgeon: Fay Records, MD;  Location: [place];  Service: Cardiovascular;  Laterality: N/A;    There were no vitals filed for this visit.      Subjective Assessment - 10/08/16 0912    Subjective Patient arrives a few minutes late; she is doing well, no major changes, still no falls/LOB    Pertinent History HTN, CVA March 2018, DM, R hip fractures, leukemia, R femur IM nail s/p fall 04/12/16, multiple CVAs this year    Patient Stated Goals to regain independence    Currently in Pain? No/denies                         [facility] Adult PT Treatment/Exercise - 10/08/16 0001      Ambulation/Gait   Gait Comments gait with walker raised one notch to promote upright posture, cues for step lenghts and posture; unable to correct from and noted shouler and neck impairment from heavy UE use so dropped wlaker to orignial position      Knee/Hip Exercises: Standing   Heel Raises Both;1 set;15 reps  Heel Raises Limitations heel raises U HHA   ongoing heavy use of UEs    Knee Flexion Both;1 set;10 reps   Knee Flexion Limitations cues for form    Other Standing Knee Exercises standing marches U HHA 1x10 B      Knee/Hip Exercises: Supine   Other Supine Knee/Hip Exercises supine to sit/sit to supine training; mod assist to scoot laterally in bed    Other Supine Knee/Hip Exercises rolling side to side with mod verbal cues and cone targets                 PT Education - 10/08/16 0943    Education provided Yes   Education Details bed mobility technique    Person(s) Educated Patient   Methods Explanation   Comprehension Verbalized understanding;Need further  instruction          PT Short Term Goals - 09/26/16 1132      PT SHORT TERM GOAL #1   Title Patient to be able to complete all functional mobility, including bed mobility, with Mod(I) in order to improve functional independence    Time 4   Period Weeks   Status New     PT SHORT TERM GOAL #2   Title Patient to be able to ambulate at gait speed of at least 0.28m/s with walker in order to improve access to home and community    Baseline 0.55m/s at eval    Time 4   Period Weeks   Status New     PT SHORT TERM GOAL #3   Title Patient to be able to complete 5 time sit to stand test in 30 seconds with UEs in order to show improved functional mobility and strength    Time 4   Period Weeks   Status New     PT SHORT TERM GOAL #4   Title Patient to be able to correctly and consistently perform HEP at least 5 days per week, to be updated as appropriate    Time 1   Period Weeks   Status New           PT Long Term Goals - 09/26/16 1136      PT LONG TERM GOAL #1   Title Patient to show functional strength as being 4+/5 in order to improve mobilty and balance    Time 8   Period Weeks   Status New     PT LONG TERM GOAL #2   Title Patient to be able to maintain tandem stance for at least 20 seconds bilaterally on solid surface and TUG test in 20 seconds with walker in order to show imrpoved balance and reduced fall risk    Time 8   Period Weeks   Status New     PT LONG TERM GOAL #3   Title Paitent to be able to ascend and descend full flight of stairs with U railing and min guard, step to pattern, no unsteadiness or fatigue, in order to improve home and community access    Time 8   Period Weeks   Status New     PT LONG TERM GOAL #4   Title Patient to be able to ambulate at at least 0.7m/s with walker in order to improve access to home and community, also to show general reduced fall risk    Time 8   Period Weeks   Status New               Plan -  10/08/16 0944     Clinical Impression Statement Trialed raising walker today to reduce flexed trunk with gait however this causes UE and neck impairments that PT was unable to correct, so returned walker to original position. Otherwise worked on bed mobility today including sit to supine/supine to sit as well as rolling to improve efficiency and independence of this task at home. Noted poor carry over with technique for mobility tasks however. Otherwise worked on functional strength with time remaining; patient demonstrates heavy UE use for support and requires multiple cues for LE use and strengthening techniques.    Rehab Potential Good   Clinical Impairments Affecting Rehab Potential (+) supportive family; (-) hx of fall with serious injury, multiple recent CVAs   PT Frequency 2x / week   PT Duration 8 weeks   PT Treatment/Interventions ADLs/Self Care Home Management;Biofeedback;Cryotherapy;DME Instruction;Gait training;Stair training;Functional mobility training;Therapeutic activities;Therapeutic exercise;Balance training;Neuromuscular re-education;Patient/family education;Manual techniques;Energy conservation;Taping   PT Next Visit Plan cotninue functional strength and balance    PT Home Exercise Plan Eval: bridges, supine hip ABD with red TB, LAQs with red TB    Consulted and Agree with Plan of Care Patient      Patient will benefit from skilled therapeutic intervention in order to improve the following deficits and impairments:  Abnormal gait, Decreased coordination, Decreased mobility, Decreased activity tolerance, Decreased strength, Decreased balance, Decreased safety awareness, Difficulty walking  Visit Diagnosis: Muscle weakness (generalized)  Difficulty in walking, not elsewhere classified  Unsteadiness on feet     Problem List Patient Active Problem List   Diagnosis Date Noted  . Controlled diabetes mellitus type 2 with complications ([place]) 79/89/2119  . Physical deconditioning   . Acute  ischemic stroke ([place]) 08/20/2016  . Pressure injury of skin 08/17/2016  . Acute CVA (cerebrovascular accident) ([place]) 08/17/2016  . UTI (urinary tract infection) 08/16/2016  . Constipation 07/10/2016  . Dysphagia as late effect of cerebrovascular accident (CVA) 07/10/2016  . Loss of weight 07/10/2016  . Impaired glucose tolerance 06/05/2016  . Slurred speech   . Hypokalemia 04/13/2016  . Hyperglycemia 04/13/2016  . Essential hypertension 04/13/2016  . Hyperlipidemia 04/13/2016  . S/P right hip fracture 04/12/2016  . Hip fracture ([place]) 04/12/2016  . Fall   . Itching in the vaginal area 10/05/2014  . Chronic myelogenous leukemia (CML), BCR-ABL1-positive ([place])   . GERD (gastroesophageal reflux disease) 12/06/2013  . Leukocytosis 08/06/2013  . Atypical chest pain 08/06/2013  . Chronic diarrhea 08/05/2013  . Pessary maintenance 07/01/2013    Deniece Ree PT, DPT [facility] [address] [place], [place], [address] Phone: [phone]   Fax:  [phone]  Name: April Page MRN: [id number] Date of Birth: [birth year]

## 2016-10-10 ENCOUNTER — Ambulatory Visit (HOSPITAL_COMMUNITY): Payer: Medicare HMO | Attending: Internal Medicine

## 2016-10-10 ENCOUNTER — Encounter (HOSPITAL_COMMUNITY): Payer: Self-pay

## 2016-10-10 DIAGNOSIS — R2681 Unsteadiness on feet: Secondary | ICD-10-CM

## 2016-10-10 DIAGNOSIS — M6281 Muscle weakness (generalized): Secondary | ICD-10-CM | POA: Diagnosis not present

## 2016-10-10 DIAGNOSIS — R262 Difficulty in walking, not elsewhere classified: Secondary | ICD-10-CM | POA: Diagnosis not present

## 2016-10-10 DIAGNOSIS — R29898 Other symptoms and signs involving the musculoskeletal system: Secondary | ICD-10-CM | POA: Insufficient documentation

## 2016-10-10 DIAGNOSIS — R278 Other lack of coordination: Secondary | ICD-10-CM | POA: Diagnosis not present

## 2016-10-10 NOTE — Therapy (Signed)
[place], [place], [address] Phone: [phone]   Fax:  [phone]  Physical Therapy Treatment  Patient Details  Name: April Page MRN: [id number] Date of Birth: November 13, 1945 Referring Provider: [employer]   Encounter Date: 10/10/2016      PT End of Session - 10/10/16 1444    Visit Number 4   Number of Visits 17   Date for PT Re-Evaluation 10/24/16   Authorization Type [employer] H[employer]   Authorization Time Period 09/26/16 to 11/27/16   Authorization - Visit Number 4   Authorization - Number of Visits 10   PT Start Time 7253   PT Stop Time 1521   PT Time Calculation (min) 48 min   Equipment Utilized During Treatment Gait belt   Activity Tolerance Patient tolerated treatment well;No increased pain   Behavior During Therapy WFL for tasks assessed/performed      Past Medical History:  Diagnosis Date  . Arthritis    stiff knees  . Blood dyscrasia   . Cancer ([place])   . CVA (cerebral vascular accident) ([place])   . GERD (gastroesophageal reflux disease)   . H/O: CML (chronic myeloid leukemia)   . Hyperlipidemia   . Hyperlipidemia   . Hypertension   . Obesity   . Pessary maintenance 07/01/2013  . Stroke ([place])    x 3    Past Surgical History:  Procedure Laterality Date  . ABDOMINAL HYSTERECTOMY    . CHOLECYSTECTOMY    . COLONOSCOPY  06/09/2002   GUY:QIHKVQQV hemorrhoids; otherwise normal rectum, colon   . COLONOSCOPY N/A 08/25/2013   ZDG:LOVFIEP diverticulosis. Single colonic polyp-removed  s/p segmental biopsy and stool sample. random colon bx negative. +benign leiomyoma.  . cystocele/rectocele repair  2009  . ESOPHAGOGASTRODUODENOSCOPY  06/09/2002   PIR:JJOACZ upper gastrointestinal tract s/p  54-French Maloney dilator  . FEMUR IM NAIL Right 04/12/2016   Procedure: INTRAMEDULLARY (IM) NAIL FEMORAL;  Surgeon: Nicholes Stairs, MD;  Location: [facility];  Service: Orthopedics;  Laterality: Right;  .  HERNIA REPAIR    . LOOP RECORDER INSERTION N/A 08/23/2016   Procedure: Loop Recorder Insertion;  Surgeon: Constance Haw, MD;  Location: [place] [facility];  Service: Cardiovascular;  Laterality: N/A;  . OOPHORECTOMY    . TEE WITHOUT CARDIOVERSION N/A 08/23/2016   Procedure: TRANSESOPHAGEAL ECHOCARDIOGRAM (TEE);  Surgeon: Fay Records, MD;  Location: [facility];  Service: Cardiovascular;  Laterality: N/A;    There were no vitals filed for this visit.      Subjective Assessment - 10/10/16 1443    Subjective Pt reports she is feeling good today, no reports of pain of recenet falls.  Reports compliance wiht HEP 1x daily   Pertinent History HTN, CVA March 2018, DM, R hip fractures, leukemia, R femur IM nail s/p fall 04/12/16, multiple CVAs this year    Patient Stated Goals to regain independence    Currently in Pain? No/denies                         [facility] Adult PT Treatment/Exercise - 10/10/16 0001      Ambulation/Gait   Ambulation Distance (Feet) 45 Feet   Assistive device Rolling walker   Gait Pattern Step-through pattern;Shuffle;Decreased trunk rotation;Trunk flexed;Narrow base of support   Ambulation Surface Level;Indoor   Gait velocity slow   Gait Comments cueing to relax Rt upper trap during gait and therex, cueing for posture to assist with  balance and to increase stride length with gait to improve mechanics and energy conservation      Posture/Postural Control   Posture Comments decreased control with Rt lateral/posterior lean in sitting     Knee/Hip Exercises: Aerobic   Nustep Nustep level 2 no hills UE/LE x8 minute goal for 40 SPM, average 34 SPM     Knee/Hip Exercises: Standing   Heel Raises Both;1 set;15 reps   Heel Raises Limitations heel raises U HHA    Knee Flexion Both;1 set;10 reps   Knee Flexion Limitations cues for form    Hip Abduction 10 reps;Both;2 sets   Other Standing Knee Exercises standing marches U HHA 1x10 B    Other Standing  Knee Exercises NBOS 2x 1 min no HHA; side step in// bars with minimal HHA 2RT     Knee/Hip Exercises: Seated   Sit to Sand 5 reps;with UE support  1 HHA                  PT Short Term Goals - 09/26/16 1132      PT SHORT TERM GOAL #1   Title Patient to be able to complete all functional mobility, including bed mobility, with Mod(I) in order to improve functional independence    Time 4   Period Weeks   Status New     PT SHORT TERM GOAL #2   Title Patient to be able to ambulate at gait speed of at least 0.68m/s with walker in order to improve access to home and community    Baseline 0.26m/s at eval    Time 4   Period Weeks   Status New     PT SHORT TERM GOAL #3   Title Patient to be able to complete 5 time sit to stand test in 30 seconds with UEs in order to show improved functional mobility and strength    Time 4   Period Weeks   Status New     PT SHORT TERM GOAL #4   Title Patient to be able to correctly and consistently perform HEP at least 5 days per week, to be updated as appropriate    Time 1   Period Weeks   Status New           PT Long Term Goals - 09/26/16 1136      PT LONG TERM GOAL #1   Title Patient to show functional strength as being 4+/5 in order to improve mobilty and balance    Time 8   Period Weeks   Status New     PT LONG TERM GOAL #2   Title Patient to be able to maintain tandem stance for at least 20 seconds bilaterally on solid surface and TUG test in 20 seconds with walker in order to show imrpoved balance and reduced fall risk    Time 8   Period Weeks   Status New     PT LONG TERM GOAL #3   Title Paitent to be able to ascend and descend full flight of stairs with U railing and min guard, step to pattern, no unsteadiness or fatigue, in order to improve home and community access    Time 8   Period Weeks   Status New     PT LONG TERM GOAL #4   Title Patient to be able to ambulate at at least 0.70m/s with walker in order to improve  access to home and community, also to show general reduced fall risk  Time 8   Period Weeks   Status New               Plan - 10/10/16 1527    Clinical Impression Statement Began session with Nustep UE/LE for strengthening, improve sequence with opposite extremity with gait and increased speed for endurance training, average SPM at 34, goal was 40 SPM.  Therex focus on CKC for LE strenghtening and balance training with least HHA PRN with min A for safety.  Pt very slow with gait training, cueing to improve posture to assist with balance and increase stride length to normalize gait mechancis and energy conversation.  No reports of pain through session, was limited by fatigue with activities.     Rehab Potential Good   Clinical Impairments Affecting Rehab Potential (+) supportive family; (-) hx of fall with serious injury, multiple recent CVAs   PT Frequency 2x / week   PT Duration 8 weeks   PT Treatment/Interventions ADLs/Self Care Home Management;Biofeedback;Cryotherapy;DME Instruction;Gait training;Stair training;Functional mobility training;Therapeutic activities;Therapeutic exercise;Balance training;Neuromuscular re-education;Patient/family education;Manual techniques;Energy conservation;Taping   PT Next Visit Plan cotninue functional strength and balance    PT Home Exercise Plan Eval: bridges, supine hip ABD with red TB, LAQs with red TB       Patient will benefit from skilled therapeutic intervention in order to improve the following deficits and impairments:  Abnormal gait, Decreased coordination, Decreased mobility, Decreased activity tolerance, Decreased strength, Decreased balance, Decreased safety awareness, Difficulty walking  Visit Diagnosis: Muscle weakness (generalized)  Difficulty in walking, not elsewhere classified  Unsteadiness on feet  Other lack of coordination     Problem List Patient Active Problem List   Diagnosis Date Noted  . Controlled diabetes  mellitus type 2 with complications ([place]) 81/85/6314  . Physical deconditioning   . Acute ischemic stroke ([place]) 08/20/2016  . Pressure injury of skin 08/17/2016  . Acute CVA (cerebrovascular accident) ([place]) 08/17/2016  . UTI (urinary tract infection) 08/16/2016  . Constipation 07/10/2016  . Dysphagia as late effect of cerebrovascular accident (CVA) 07/10/2016  . Loss of weight 07/10/2016  . Impaired glucose tolerance 06/05/2016  . Slurred speech   . Hypokalemia 04/13/2016  . Hyperglycemia 04/13/2016  . Essential hypertension 04/13/2016  . Hyperlipidemia 04/13/2016  . S/P right hip fracture 04/12/2016  . Hip fracture ([place]) 04/12/2016  . Fall   . Itching in the vaginal area 10/05/2014  . Chronic myelogenous leukemia (CML), BCR-ABL1-positive ([place])   . GERD (gastroesophageal reflux disease) 12/06/2013  . Leukocytosis 08/06/2013  . Atypical chest pain 08/06/2013  . Chronic diarrhea 08/05/2013  . Pessary maintenance 07/01/2013   Ihor Austin, [place]; [place]  Aldona Lento 10/10/2016, 3:37 PM  [facility] [address] [place], [place], [address] Phone: [phone]   Fax:  [phone]  Name: April Page MRN: [id number] Date of Birth: [birth year]

## 2016-10-15 ENCOUNTER — Ambulatory Visit (HOSPITAL_COMMUNITY): Payer: Medicare HMO

## 2016-10-15 ENCOUNTER — Encounter (HOSPITAL_COMMUNITY): Payer: Self-pay

## 2016-10-15 ENCOUNTER — Ambulatory Visit (HOSPITAL_COMMUNITY): Payer: Medicare HMO | Admitting: Physical Therapy

## 2016-10-15 ENCOUNTER — Telehealth (HOSPITAL_COMMUNITY): Payer: Self-pay

## 2016-10-15 DIAGNOSIS — R262 Difficulty in walking, not elsewhere classified: Secondary | ICD-10-CM | POA: Diagnosis not present

## 2016-10-15 DIAGNOSIS — R2681 Unsteadiness on feet: Secondary | ICD-10-CM

## 2016-10-15 DIAGNOSIS — M6281 Muscle weakness (generalized): Secondary | ICD-10-CM | POA: Diagnosis not present

## 2016-10-15 DIAGNOSIS — R278 Other lack of coordination: Secondary | ICD-10-CM

## 2016-10-15 DIAGNOSIS — R29898 Other symptoms and signs involving the musculoskeletal system: Secondary | ICD-10-CM

## 2016-10-15 NOTE — Therapy (Signed)
[place], [place], [address] Phone: [phone]   Fax:  [phone]  Physical Therapy Treatment  Patient Details  Name: April Page MRN: [id number] Date of Birth: [birth year] Referring Provider: [employer]   Encounter Date: 10/15/2016      PT End of Session - 10/15/16 1213    Visit Number 5   Number of Visits 17   Date for PT Re-Evaluation 10/24/16   Authorization Type [employer] H[employer]   Authorization Time Period 09/26/16 to 11/27/16   Authorization - Visit Number 5   Authorization - Number of Visits 10   PT Start Time 4166   PT Stop Time 1156   PT Time Calculation (min) 40 min   Equipment Utilized During Treatment Gait belt   Activity Tolerance Patient tolerated treatment well   Behavior During Therapy [facility] for tasks assessed/performed      Past Medical History:  Diagnosis Date  . Arthritis    stiff knees  . Blood dyscrasia   . Cancer ([place])   . CVA (cerebral vascular accident) ([place])   . GERD (gastroesophageal reflux disease)   . H/O: CML (chronic myeloid leukemia)   . Hyperlipidemia   . Hyperlipidemia   . Hypertension   . Obesity   . Pessary maintenance 07/01/2013  . Stroke ([place])    x 3    Past Surgical History:  Procedure Laterality Date  . ABDOMINAL HYSTERECTOMY    . CHOLECYSTECTOMY    . COLONOSCOPY  06/09/2002   AYT:[id number] hemorrhoids; otherwise normal rectum, colon   . COLONOSCOPY N/A 08/25/2013   ATF:TDDUKGU diverticulosis. Single colonic polyp-removed  s/p segmental biopsy and stool sample. random colon bx negative. +benign leiomyoma.  . cystocele/rectocele repair  2009  . ESOPHAGOGASTRODUODENOSCOPY  06/09/2002   RKY:HCWCBJ upper gastrointestinal tract s/p  54-French Maloney dilator  . FEMUR IM NAIL Right 04/12/2016   Procedure: INTRAMEDULLARY (IM) NAIL FEMORAL;  Surgeon: Nicholes Stairs, MD;  Location: [place];  Service: Orthopedics;  Laterality: Right;  . HERNIA REPAIR     . LOOP RECORDER INSERTION N/A 08/23/2016   Procedure: Loop Recorder Insertion;  Surgeon: Constance Haw, MD;  Location: [place] [facility];  Service: Cardiovascular;  Laterality: N/A;  . OOPHORECTOMY    . TEE WITHOUT CARDIOVERSION N/A 08/23/2016   Procedure: TRANSESOPHAGEAL ECHOCARDIOGRAM (TEE);  Surgeon: Fay Records, MD;  Location: [facility];  Service: Cardiovascular;  Laterality: N/A;    There were no vitals filed for this visit.      Subjective Assessment - 10/15/16 1204    Subjective Patient arrives after OT today, doing well with no major changes    Pertinent History HTN, CVA March 2018, DM, R hip fractures, leukemia, R femur IM nail s/p fall 04/12/16, multiple CVAs this year    Patient Stated Goals to regain independence    Currently in Pain? No/denies                         [facility] Adult PT Treatment/Exercise - 10/15/16 1205      Knee/Hip Exercises: Aerobic   Nustep Nustep level 0 mod-max verbal cues for SPM 45-50SPM throughout UEs/LEs      Knee/Hip Exercises: Standing   Forward Step Up Limitations attempted on 4 inch stair and 2 inch step with mod assist from PT and no UEs; significant difficulty noted    Gait Training gait training for increased gait speed, initially at 0.47m/s and progressing  to 0.14m/s with mod assist from PT     Other Standing Knee Exercises toe taps on 4 inch box mod assist from PT no HHA                 PT Education - 10/15/16 1213    Education provided No          PT Short Term Goals - 09/26/16 1132      PT SHORT TERM GOAL #1   Title Patient to be able to complete all functional mobility, including bed mobility, with Mod(I) in order to improve functional independence    Time 4   Period Weeks   Status New     PT SHORT TERM GOAL #2   Title Patient to be able to ambulate at gait speed of at least 0.62m/s with walker in order to improve access to home and community    Baseline 0.9m/s at eval    Time 4    Period Weeks   Status New     PT SHORT TERM GOAL #3   Title Patient to be able to complete 5 time sit to stand test in 30 seconds with UEs in order to show improved functional mobility and strength    Time 4   Period Weeks   Status New     PT SHORT TERM GOAL #4   Title Patient to be able to correctly and consistently perform HEP at least 5 days per week, to be updated as appropriate    Time 1   Period Weeks   Status New           PT Long Term Goals - 09/26/16 1136      PT LONG TERM GOAL #1   Title Patient to show functional strength as being 4+/5 in order to improve mobilty and balance    Time 8   Period Weeks   Status New     PT LONG TERM GOAL #2   Title Patient to be able to maintain tandem stance for at least 20 seconds bilaterally on solid surface and TUG test in 20 seconds with walker in order to show imrpoved balance and reduced fall risk    Time 8   Period Weeks   Status New     PT LONG TERM GOAL #3   Title Paitent to be able to ascend and descend full flight of stairs with U railing and min guard, step to pattern, no unsteadiness or fatigue, in order to improve home and community access    Time 8   Period Weeks   Status New     PT LONG TERM GOAL #4   Title Patient to be able to ambulate at at least 0.73m/s with walker in order to improve access to home and community, also to show general reduced fall risk    Time 8   Period Weeks   Status New               Plan - 10/15/16 1213    Clinical Impression Statement Began session on Nustep with mod-max verbal cues for speed on Nustep today; followed this immediately by mod assist for increased gait speed drill, with improved outcomes but no carryover for gait rest of session. Spent majority of time attempting LE exercise with no HHA to more accurately target LE musculature but with attempts fairly unsuccessful and patient requiring Mod assist for this as well, noting extensive UE compensation patterns for LE  weakness as well as  extreme fear of falling. Recommend return to basic table/mat work next session.    Rehab Potential Good   Clinical Impairments Affecting Rehab Potential (+) supportive family; (-) hx of fall with serious injury, multiple recent CVAs   PT Frequency 2x / week   PT Duration 8 weeks   PT Treatment/Interventions ADLs/Self Care Home Management;Biofeedback;Cryotherapy;DME Instruction;Gait training;Stair training;Functional mobility training;Therapeutic activities;Therapeutic exercise;Balance training;Neuromuscular re-education;Patient/family education;Manual techniques;Energy conservation;Taping   PT Next Visit Plan Nustep with mod-max cues for speed/SPM at least 45-60. Gait speed training with cane pull. Otherwise return to basics/mat work and [place] strengthening for speed.    PT Home Exercise Plan Eval: bridges, supine hip ABD with red TB, LAQs with red TB    Consulted and Agree with Plan of Care Patient      Patient will benefit from skilled therapeutic intervention in order to improve the following deficits and impairments:  Abnormal gait, Decreased coordination, Decreased mobility, Decreased activity tolerance, Decreased strength, Decreased balance, Decreased safety awareness, Difficulty walking  Visit Diagnosis: Muscle weakness (generalized)  Difficulty in walking, not elsewhere classified  Unsteadiness on feet  Other lack of coordination     Problem List Patient Active Problem List   Diagnosis Date Noted  . Controlled diabetes mellitus type 2 with complications ([place]) 88/75/7972  . Physical deconditioning   . Acute ischemic stroke ([place]) 08/20/2016  . Pressure injury of skin 08/17/2016  . Acute CVA (cerebrovascular accident) ([place]) 08/17/2016  . UTI (urinary tract infection) 08/16/2016  . Constipation 07/10/2016  . Dysphagia as late effect of cerebrovascular accident (CVA) 07/10/2016  . Loss of weight 07/10/2016  . Impaired glucose tolerance 06/05/2016  . Slurred  speech   . Hypokalemia 04/13/2016  . Hyperglycemia 04/13/2016  . Essential hypertension 04/13/2016  . Hyperlipidemia 04/13/2016  . S/P right hip fracture 04/12/2016  . Hip fracture ([place]) 04/12/2016  . Fall   . Itching in the vaginal area 10/05/2014  . Chronic myelogenous leukemia (CML), BCR-ABL1-positive ([place])   . GERD (gastroesophageal reflux disease) 12/06/2013  . Leukocytosis 08/06/2013  . Atypical chest pain 08/06/2013  . Chronic diarrhea 08/05/2013  . Pessary maintenance 07/01/2013    Deniece Ree PT, DPT [facility] [address] [place], [place], [address] Phone: [phone]   Fax:  [phone]  Name: April Page MRN: [id number] Date of Birth: 1945/04/05

## 2016-10-15 NOTE — Therapy (Signed)
[place], [place], [address] Phone: [phone]   Fax:  [phone]  Occupational Therapy Treatment  Patient Details  Name: April Page MRN: [id number] Date of Birth: Apr 11, 1945 Referring Provider: Dr. [employer]  Encounter Date: 10/15/2016      OT End of Session - 10/15/16 1203    Visit Number 3   Number of Visits 16   Date for OT Re-Evaluation 11/26/16  mini-reassessment 10/25/2016   Authorization Type [employer]   Authorization Time Period Before 10th visit   Authorization - Visit Number 3   Authorization - Number of Visits 10   OT Start Time 1031   OT Stop Time 1115   OT Time Calculation (min) 44 min   Activity Tolerance Patient tolerated treatment well   Behavior During Therapy [facility] for tasks assessed/performed      Past Medical History:  Diagnosis Date  . Arthritis    stiff knees  . Blood dyscrasia   . Cancer ([place])   . CVA (cerebral vascular accident) ([place])   . GERD (gastroesophageal reflux disease)   . H/O: CML (chronic myeloid leukemia)   . Hyperlipidemia   . Hyperlipidemia   . Hypertension   . Obesity   . Pessary maintenance 07/01/2013  . Stroke ([place])    x 3    Past Surgical History:  Procedure Laterality Date  . ABDOMINAL HYSTERECTOMY    . CHOLECYSTECTOMY    . COLONOSCOPY  06/09/2002   CBJ:SEGBTDVV hemorrhoids; otherwise normal rectum, colon   . COLONOSCOPY N/A 08/25/2013   OHY:WVPXTGG diverticulosis. Single colonic polyp-removed  s/p segmental biopsy and stool sample. random colon bx negative. +benign leiomyoma.  . cystocele/rectocele repair  2009  . ESOPHAGOGASTRODUODENOSCOPY  06/09/2002   YIR:SWNIOE upper gastrointestinal tract s/p  54-French Maloney dilator  . FEMUR IM NAIL Right 04/12/2016   Procedure: INTRAMEDULLARY (IM) NAIL FEMORAL;  Surgeon: Nicholes Stairs, MD;  Location: [place];  Service: Orthopedics;  Laterality: Right;  . HERNIA REPAIR    . LOOP RECORDER  INSERTION N/A 08/23/2016   Procedure: Loop Recorder Insertion;  Surgeon: Constance Haw, MD;  Location: [place] [facility];  Service: Cardiovascular;  Laterality: N/A;  . OOPHORECTOMY    . TEE WITHOUT CARDIOVERSION N/A 08/23/2016   Procedure: TRANSESOPHAGEAL ECHOCARDIOGRAM (TEE);  Surgeon: Fay Records, MD;  Location: [place];  Service: Cardiovascular;  Laterality: N/A;    There were no vitals filed for this visit.      Subjective Assessment - 10/15/16 1046    Subjective  S: I'm not having any pain.   Currently in Pain? No/denies            [facility] OT Assessment - 10/15/16 1049      Assessment   Diagnosis s/p CVA     Precautions   Precautions Fall                  OT Treatments/Exercises (OP) - 10/15/16 1049      Exercises   Exercises Shoulder;Elbow;Hand     Shoulder Exercises: Supine   Protraction Strengthening;10 reps   Protraction Weight (lbs) 1   Horizontal ABduction Strengthening;10 reps   Horizontal ABduction Weight (lbs) 1   External Rotation Strengthening;10 reps   External Rotation Weight (lbs) 1   Internal Rotation Strengthening;10 reps   Internal Rotation Weight (lbs) 1   Flexion Strengthening;10 reps   Shoulder Flexion Weight (lbs) 1   ABduction Strengthening;10 reps   Shoulder ABduction  Weight (lbs) 1     Elbow Exercises   Bar Weights/Barbell (Elbow Flexion) 2 lbs  10X   Elbow Extension Strengthening;10 reps  2#   Forearm Supination Strengthening;10 reps  2#   Forearm Pronation Strengthening;10 reps  2#   Wrist Flexion Strengthening;10 reps   Bar Weights/Barbell (Wrist Flexion) 2 lbs   Wrist Extension Strengthening;10 reps   Bar Weights/Barbell (Wrist Extension) 2 lbs     Hand Exercises   Small Pegboard Pt completed small peg board activity using 2-point pinch with mod difficulty. Pt removed pegs using tweezers to place into bucket.                OT Education - 10/15/16 1048    Education provided No           OT Short Term Goals - 10/01/16 1319      OT SHORT TERM GOAL #1   Title Pt will be provided with and educated on HEP for improved independence during ADL completion.    Time 4   Period Weeks   Status On-going     OT SHORT TERM GOAL #2   Title Pt will improve independence in ADL completion by decreasing level of assistance required during bathing tasks from mod/max to minimal.    Time 4   Period Weeks   Status On-going     OT SHORT TERM GOAL #3   Title Pt will improve BUE grip strength by 6# and pinch strength by 2# to improve ability to grasp and hold grooming items.    Time 4   Period Weeks   Status On-going     OT SHORT TERM GOAL #4   Title Pt will improve right fine motor coordination required for ADL completion by completing 9 hole peg test in less than 1'.    Time 4   Period Weeks   Status On-going           OT Long Term Goals - 10/01/16 1319      OT LONG TERM GOAL #1   Title Pt will return to highest level of functioning and independence in B/ADL tasks, completing tasks at supervision level or better.    Time 8   Period Weeks   Status On-going     OT LONG TERM GOAL #2   Title Pt will improve BUE strength to 4+/5 to improve ability to complete ADL tasks with greater independence.    Time 8   Period Weeks   Status On-going     OT LONG TERM GOAL #3   Title Pt will improve BUE grip strength by 12# and pinch strength by 4# to improve ability to hold and utilize items for light meal preparation.    Time 8   Period Weeks   Status On-going     OT LONG TERM GOAL #4   Title Pt will improve right fine motor coordination by completing 9 hole peg test in 45" or less.    Time 8   Period Weeks   Status On-going               Plan - 10/15/16 1109    Clinical Impression Statement A: Pt required increased time to ambulate from waiting room to treatment bay. Pt completed BUE strengthening supine presenting as less difficult compared to seated. Completed small  peg board activity with VC needed for form and technique. Increased time needed for peg board activity as well.   Plan P: Continue with BUE strengthening.  Add pinch tree activity and upper arm bike.      Patient will benefit from skilled therapeutic intervention in order to improve the following deficits and impairments:  Decreased activity tolerance, Decreased strength, Impaired UE functional use, Decreased coordination  Visit Diagnosis: Other symptoms and signs involving the musculoskeletal system  Other lack of coordination    Problem List Patient Active Problem List   Diagnosis Date Noted  . Controlled diabetes mellitus type 2 with complications ([place]) 89/16/9450  . Physical deconditioning   . Acute ischemic stroke ([place]) 08/20/2016  . Pressure injury of skin 08/17/2016  . Acute CVA (cerebrovascular accident) ([place]) 08/17/2016  . UTI (urinary tract infection) 08/16/2016  . Constipation 07/10/2016  . Dysphagia as late effect of cerebrovascular accident (CVA) 07/10/2016  . Loss of weight 07/10/2016  . Impaired glucose tolerance 06/05/2016  . Slurred speech   . Hypokalemia 04/13/2016  . Hyperglycemia 04/13/2016  . Essential hypertension 04/13/2016  . Hyperlipidemia 04/13/2016  . S/P right hip fracture 04/12/2016  . Hip fracture ([place]) 04/12/2016  . Fall   . Itching in the vaginal area 10/05/2014  . Chronic myelogenous leukemia (CML), BCR-ABL1-positive ([place])   . GERD (gastroesophageal reflux disease) 12/06/2013  . Leukocytosis 08/06/2013  . Atypical chest pain 08/06/2013  . Chronic diarrhea 08/05/2013  . Pessary maintenance 07/01/2013    Luther Hearing, OT Student [phone] 10/15/2016, 12:03 PM  [place] [address] [place], [place], [address] Phone: [phone]   Fax:  [phone]  Name: ULIANA BRINKER MRN: [id number] Date of Birth: May 24, 1945     This qualified practitioner was present in the room guiding the  student in service delivery. Therapy student was participating in the provision of services, and the practitioner was not engaged in treating another patient or doing other tasks at the same time.  Ailene Ravel, OTR/L,CBIS  [phone]

## 2016-10-15 NOTE — Telephone Encounter (Signed)
Patient request to be treated by women only.

## 2016-10-17 ENCOUNTER — Encounter (HOSPITAL_COMMUNITY): Payer: Self-pay

## 2016-10-17 ENCOUNTER — Ambulatory Visit (HOSPITAL_COMMUNITY): Payer: Medicare HMO

## 2016-10-17 DIAGNOSIS — R278 Other lack of coordination: Secondary | ICD-10-CM

## 2016-10-17 DIAGNOSIS — M6281 Muscle weakness (generalized): Secondary | ICD-10-CM | POA: Diagnosis not present

## 2016-10-17 DIAGNOSIS — R2681 Unsteadiness on feet: Secondary | ICD-10-CM | POA: Diagnosis not present

## 2016-10-17 DIAGNOSIS — R262 Difficulty in walking, not elsewhere classified: Secondary | ICD-10-CM | POA: Diagnosis not present

## 2016-10-17 DIAGNOSIS — R29898 Other symptoms and signs involving the musculoskeletal system: Secondary | ICD-10-CM

## 2016-10-17 NOTE — Therapy (Signed)
[place], [place], [address] Phone: [phone]   Fax:  [phone]  Occupational Therapy Treatment  Patient Details  Name: April Page MRN: [id number] Date of Birth: Mar 22, 1945 Referring Provider: Dr. Redmond School  Encounter Date: 10/17/2016      OT End of Session - 10/17/16 1715    Visit Number 4   Number of Visits 16   Date for OT Re-Evaluation 11/26/16  mini-reassessment 10/25/2016   Authorization Type [employer]   Authorization Time Period Before 10th visit   Authorization - Visit Number 4   Authorization - Number of Visits 10   OT Start Time 3664   OT Stop Time 1428   OT Time Calculation (min) 40 min   Activity Tolerance Patient tolerated treatment well   Behavior During Therapy [facility] for tasks assessed/performed      Past Medical History:  Diagnosis Date  . Arthritis    stiff knees  . Blood dyscrasia   . Cancer ([place])   . CVA (cerebral vascular accident) ([place])   . GERD (gastroesophageal reflux disease)   . H/O: CML (chronic myeloid leukemia)   . Hyperlipidemia   . Hyperlipidemia   . Hypertension   . Obesity   . Pessary maintenance 07/01/2013  . Stroke ([place])    x 3    Past Surgical History:  Procedure Laterality Date  . ABDOMINAL HYSTERECTOMY    . CHOLECYSTECTOMY    . COLONOSCOPY  06/09/2002   QIH:KVQQVZDG hemorrhoids; otherwise normal rectum, colon   . COLONOSCOPY N/A 08/25/2013   LOV:FIEPPIR diverticulosis. Single colonic polyp-removed  s/p segmental biopsy and stool sample. random colon bx negative. +benign leiomyoma.  . cystocele/rectocele repair  2009  . ESOPHAGOGASTRODUODENOSCOPY  06/09/2002   JJO:ACZYSA upper gastrointestinal tract s/p  54-French Maloney dilator  . FEMUR IM NAIL Right 04/12/2016   Procedure: INTRAMEDULLARY (IM) NAIL FEMORAL;  Surgeon: Nicholes Stairs, MD;  Location: [place];  Service: Orthopedics;  Laterality: Right;  . HERNIA REPAIR    . LOOP RECORDER  INSERTION N/A 08/23/2016   Procedure: Loop Recorder Insertion;  Surgeon: Constance Haw, MD;  Location: [place] CV LAB;  Service: Cardiovascular;  Laterality: N/A;  . OOPHORECTOMY    . TEE WITHOUT CARDIOVERSION N/A 08/23/2016   Procedure: TRANSESOPHAGEAL ECHOCARDIOGRAM (TEE);  Surgeon: Fay Records, MD;  Location: [place];  Service: Cardiovascular;  Laterality: N/A;    There were no vitals filed for this visit.      Subjective Assessment - 10/17/16 1711    Subjective  S: I'm about the same with no pain.   Currently in Pain? No/denies            [facility] OT Assessment - 10/17/16 1711      Assessment   Diagnosis s/p CVA     Precautions   Precautions Fall                  OT Treatments/Exercises (OP) - 10/17/16 1712      Exercises   Exercises Shoulder;Elbow;Hand     Shoulder Exercises: Seated   Protraction Strengthening;10 reps   Protraction Weight (lbs) 1   Horizontal ABduction Strengthening;10 reps   Horizontal ABduction Weight (lbs) 1   External Rotation Strengthening;10 reps   External Rotation Weight (lbs) 1   Internal Rotation Strengthening;10 reps   Internal Rotation Weight (lbs) 1   Flexion Strengthening;10 reps   Flexion Weight (lbs) 1   Abduction Strengthening;10 reps   ABduction  Weight (lbs) 1     Shoulder Exercises: ROM/Strengthening   UBE (Upper Arm Bike) 4' forwards, level 1     Elbow Exercises   Bar Weights/Barbell (Elbow Flexion) 2 lbs  10X   Elbow Extension Strengthening;10 reps  2#   Forearm Supination Strengthening;10 reps  2#   Forearm Pronation Strengthening;10 reps  2#   Wrist Flexion Strengthening;10 reps   Bar Weights/Barbell (Wrist Flexion) 2 lbs   Wrist Extension Strengthening;10 reps   Bar Weights/Barbell (Wrist Extension) 2 lbs     Hand Exercises   Other Hand Exercises Pt completed pinch tree activity putting all blue, red, green and yellow clothes pins onto vertical pole with right hand then removing  them. Pt repeated this with left hand.   Other Hand Exercises Pt used eggcersizer eggs to increase grip strength squeezing each one 10X.                OT Education - 10/17/16 1715    Education provided No          OT Short Term Goals - 10/01/16 1319      OT SHORT TERM GOAL #1   Title Pt will be provided with and educated on HEP for improved independence during ADL completion.    Time 4   Period Weeks   Status On-going     OT SHORT TERM GOAL #2   Title Pt will improve independence in ADL completion by decreasing level of assistance required during bathing tasks from mod/max to minimal.    Time 4   Period Weeks   Status On-going     OT SHORT TERM GOAL #3   Title Pt will improve BUE grip strength by 6# and pinch strength by 2# to improve ability to grasp and hold grooming items.    Time 4   Period Weeks   Status On-going     OT SHORT TERM GOAL #4   Title Pt will improve right fine motor coordination required for ADL completion by completing 9 hole peg test in less than 1'.    Time 4   Period Weeks   Status On-going           OT Long Term Goals - 10/01/16 1319      OT LONG TERM GOAL #1   Title Pt will return to highest level of functioning and independence in B/ADL tasks, completing tasks at supervision level or better.    Time 8   Period Weeks   Status On-going     OT LONG TERM GOAL #2   Title Pt will improve BUE strength to 4+/5 to improve ability to complete ADL tasks with greater independence.    Time 8   Period Weeks   Status On-going     OT LONG TERM GOAL #3   Title Pt will improve BUE grip strength by 12# and pinch strength by 4# to improve ability to hold and utilize items for light meal preparation.    Time 8   Period Weeks   Status On-going     OT LONG TERM GOAL #4   Title Pt will improve right fine motor coordination by completing 9 hole peg test in 45" or less.    Time 8   Period Weeks   Status On-going               Plan  - 10/17/16 1716    Clinical Impression Statement A: Pt completed BUE strengthening using dowel rod, presented with increased  coordination using this. Completed pinch tree activity with VC needed for form and technique. Pt requires intermittent rest breaks due to fatigue.   Plan P: Continue with BUE strengthening using dowel rod. Complete hand gripper and grooved peg board.      Patient will benefit from skilled therapeutic intervention in order to improve the following deficits and impairments:  Decreased activity tolerance, Decreased strength, Impaired UE functional use, Decreased coordination  Visit Diagnosis: Other symptoms and signs involving the musculoskeletal system  Other lack of coordination    Problem List Patient Active Problem List   Diagnosis Date Noted  . Controlled diabetes mellitus type 2 with complications ([place]) 38/18/2993  . Physical deconditioning   . Acute ischemic stroke ([place]) 08/20/2016  . Pressure injury of skin 08/17/2016  . Acute CVA (cerebrovascular accident) ([place]) 08/17/2016  . UTI (urinary tract infection) 08/16/2016  . Constipation 07/10/2016  . Dysphagia as late effect of cerebrovascular accident (CVA) 07/10/2016  . Loss of weight 07/10/2016  . Impaired glucose tolerance 06/05/2016  . Slurred speech   . Hypokalemia 04/13/2016  . Hyperglycemia 04/13/2016  . Essential hypertension 04/13/2016  . Hyperlipidemia 04/13/2016  . S/P right hip fracture 04/12/2016  . Hip fracture ([place]) 04/12/2016  . Fall   . Itching in the vaginal area 10/05/2014  . Chronic myelogenous leukemia (CML), BCR-ABL1-positive ([place])   . GERD (gastroesophageal reflux disease) 12/06/2013  . Leukocytosis 08/06/2013  . Atypical chest pain 08/06/2013  . Chronic diarrhea 08/05/2013  . Pessary maintenance 07/01/2013    Luther Hearing, OT Student [phone] 10/17/2016, 5:19 PM  [facility] [address] [place], [place],  [address] Phone: [phone]   Fax:  [phone]  Name: REIGAN TOLLIVER MRN: [id number] Date of Birth: Aug 30, 1945     This qualified practitioner was present in the room guiding the student in service delivery. Therapy student was participating in the provision of services, and the practitioner was not engaged in treating another patient or doing other tasks at the same time.  Ailene Ravel, OTR/L,CBIS  [phone]

## 2016-10-17 NOTE — Therapy (Signed)
[place], [place], [address] Phone: [phone]   Fax:  [phone]  Physical Therapy Treatment  Patient Details  Name: MYSTIQUE BJELLAND MRN: [id number] Date of Birth: 11/30/1945 Referring Provider: Redmond School   Encounter Date: 10/17/2016      PT End of Session - 10/17/16 1334    Visit Number 6   Number of Visits 17   Date for PT Re-Evaluation 10/24/16   Authorization Type [employer] HMO   Authorization Time Period 09/26/16 to 11/27/16   Authorization - Visit Number 6   Authorization - Number of Visits 10   PT Start Time 1300   PT Stop Time 1346   PT Time Calculation (min) 46 min   Equipment Utilized During Treatment Gait belt   Activity Tolerance Patient tolerated treatment well;Patient limited by fatigue   Behavior During Therapy [facility] for tasks assessed/performed      Past Medical History:  Diagnosis Date  . Arthritis    stiff knees  . Blood dyscrasia   . Cancer ([place])   . CVA (cerebral vascular accident) ([place])   . GERD (gastroesophageal reflux disease)   . H/O: CML (chronic myeloid leukemia)   . Hyperlipidemia   . Hyperlipidemia   . Hypertension   . Obesity   . Pessary maintenance 07/01/2013  . Stroke ([place])    x 3    Past Surgical History:  Procedure Laterality Date  . ABDOMINAL HYSTERECTOMY    . CHOLECYSTECTOMY    . COLONOSCOPY  06/09/2002   TKW:IOXBDZHG hemorrhoids; otherwise normal rectum, colon   . COLONOSCOPY N/A 08/25/2013   DJM:EQASTMH diverticulosis. Single colonic polyp-removed  s/p segmental biopsy and stool sample. random colon bx negative. +benign leiomyoma.  . cystocele/rectocele repair  2009  . ESOPHAGOGASTRODUODENOSCOPY  06/09/2002   DQQ:IWLNLG upper gastrointestinal tract s/p  54-French Maloney dilator  . FEMUR IM NAIL Right 04/12/2016   Procedure: INTRAMEDULLARY (IM) NAIL FEMORAL;  Surgeon: Nicholes Stairs, MD;  Location: [place];  Service: Orthopedics;  Laterality:  Right;  . HERNIA REPAIR    . LOOP RECORDER INSERTION N/A 08/23/2016   Procedure: Loop Recorder Insertion;  Surgeon: Constance Haw, MD;  Location: [place] [facility];  Service: Cardiovascular;  Laterality: N/A;  . OOPHORECTOMY    . TEE WITHOUT CARDIOVERSION N/A 08/23/2016   Procedure: TRANSESOPHAGEAL ECHOCARDIOGRAM (TEE);  Surgeon: Fay Records, MD;  Location: [place];  Service: Cardiovascular;  Laterality: N/A;    There were no vitals filed for this visit.      Subjective Assessment - 10/17/16 1308    Subjective Caregiver arrived with pt. reports compliace with HEP trying for twice a day.  no reports of major changes                         [facility] Adult PT Treatment/Exercise - 10/17/16 0001      Knee/Hip Exercises: Aerobic   Nustep Nustep level 0 mod-max verbal cues for SPM 45-50SPM throughout UEs/LEs; average SPM 33     Knee/Hip Exercises: Seated   Long Arc Quad 20 reps   Long Arc Quad Weight --  RTB   Long Arc Quad Limitations sitting on dynadisc alternating LE no HHA   Marching Limitations sitting on dynadisc alternating LE no HHA   Hamstring Limitations sitting on dynadisc alternating LE no HHA     Knee/Hip Exercises: Supine   Bridges 2 sets;10 reps   Bridges Limitations RTB around  knee cueing for form and technique     Knee/Hip Exercises: Sidelying   Hip ABduction Both;1 set;10 reps                  PT Short Term Goals - 09/26/16 1132      PT SHORT TERM GOAL #1   Title Patient to be able to complete all functional mobility, including bed mobility, with Mod(I) in order to improve functional independence    Time 4   Period Weeks   Status New     PT SHORT TERM GOAL #2   Title Patient to be able to ambulate at gait speed of at least 0.30m/s with walker in order to improve access to home and community    Baseline 0.61m/s at eval    Time 4   Period Weeks   Status New     PT SHORT TERM GOAL #3   Title Patient to be able to  complete 5 time sit to stand test in 30 seconds with UEs in order to show improved functional mobility and strength    Time 4   Period Weeks   Status New     PT SHORT TERM GOAL #4   Title Patient to be able to correctly and consistently perform HEP at least 5 days per week, to be updated as appropriate    Time 1   Period Weeks   Status New           PT Long Term Goals - 09/26/16 1136      PT LONG TERM GOAL #1   Title Patient to show functional strength as being 4+/5 in order to improve mobilty and balance    Time 8   Period Weeks   Status New     PT LONG TERM GOAL #2   Title Patient to be able to maintain tandem stance for at least 20 seconds bilaterally on solid surface and TUG test in 20 seconds with walker in order to show imrpoved balance and reduced fall risk    Time 8   Period Weeks   Status New     PT LONG TERM GOAL #3   Title Paitent to be able to ascend and descend full flight of stairs with U railing and min guard, step to pattern, no unsteadiness or fatigue, in order to improve home and community access    Time 8   Period Weeks   Status New     PT LONG TERM GOAL #4   Title Patient to be able to ambulate at at least 0.72m/s with walker in order to improve access to home and community, also to show general reduced fall risk    Time 8   Period Weeks   Status New               Plan - 10/17/16 1412    Clinical Impression Statement Began session on Nustep wiht mod-max verbal cueing to increase speed with goal of 45 SPM, average 33 SPM.  Pt very slow through session and appeared not motivated to complete any tasks.  Reviewed goals as encouragement to complete tasks.  Therapist facilitation for appropriate form and to complete task.  Did not add any additional HEP this session as pt required moderate cueing to complete majority of therex with proper form and technqiues.     Rehab Potential Good   Clinical Impairments Affecting Rehab Potential (+) supportive  family; (-) hx of fall with serious injury, multiple recent CVAs  PT Frequency 2x / week   PT Duration 8 weeks   PT Treatment/Interventions ADLs/Self Care Home Management;Biofeedback;Cryotherapy;DME Instruction;Gait training;Stair training;Functional mobility training;Therapeutic activities;Therapeutic exercise;Balance training;Neuromuscular re-education;Patient/family education;Manual techniques;Energy conservation;Taping   PT Next Visit Plan Nustep with mod-max cues for speed/SPM at least 45-60. Gait speed training with cane pull. Otherwise return to basics/mat work and [place] strengthening for speed.    PT Home Exercise Plan Eval: bridges, supine hip ABD with red TB, LAQs with red TB       Patient will benefit from skilled therapeutic intervention in order to improve the following deficits and impairments:  Abnormal gait, Decreased coordination, Decreased mobility, Decreased activity tolerance, Decreased strength, Decreased balance, Decreased safety awareness, Difficulty walking  Visit Diagnosis: Muscle weakness (generalized)  Difficulty in walking, not elsewhere classified  Unsteadiness on feet  Other lack of coordination     Problem List Patient Active Problem List   Diagnosis Date Noted  . Controlled diabetes mellitus type 2 with complications ([place]) 76/73/4193  . Physical deconditioning   . Acute ischemic stroke ([place]) 08/20/2016  . Pressure injury of skin 08/17/2016  . Acute CVA (cerebrovascular accident) ([place]) 08/17/2016  . UTI (urinary tract infection) 08/16/2016  . Constipation 07/10/2016  . Dysphagia as late effect of cerebrovascular accident (CVA) 07/10/2016  . Loss of weight 07/10/2016  . Impaired glucose tolerance 06/05/2016  . Slurred speech   . Hypokalemia 04/13/2016  . Hyperglycemia 04/13/2016  . Essential hypertension 04/13/2016  . Hyperlipidemia 04/13/2016  . S/P right hip fracture 04/12/2016  . Hip fracture ([place]) 04/12/2016  . Fall   . Itching in the  vaginal area 10/05/2014  . Chronic myelogenous leukemia (CML), BCR-ABL1-positive ([place])   . GERD (gastroesophageal reflux disease) 12/06/2013  . Leukocytosis 08/06/2013  . Atypical chest pain 08/06/2013  . Chronic diarrhea 08/05/2013  . Pessary maintenance 07/01/2013   Ihor Austin, [place]; [place]  Aldona Lento 10/17/2016, 2:20 PM  [facility] [address] [place], [place], [address] Phone: [phone]   Fax:  [phone]  Name: SHEMICKA COHRS MRN: [id number] Date of Birth: 19-Apr-1945

## 2016-10-22 ENCOUNTER — Ambulatory Visit (INDEPENDENT_AMBULATORY_CARE_PROVIDER_SITE_OTHER): Payer: Medicare HMO | Admitting: *Deleted

## 2016-10-22 DIAGNOSIS — I639 Cerebral infarction, unspecified: Secondary | ICD-10-CM | POA: Diagnosis not present

## 2016-10-23 ENCOUNTER — Ambulatory Visit (HOSPITAL_COMMUNITY): Payer: Medicare HMO

## 2016-10-23 ENCOUNTER — Ambulatory Visit (HOSPITAL_COMMUNITY): Payer: Medicare HMO | Admitting: Occupational Therapy

## 2016-10-23 ENCOUNTER — Encounter (HOSPITAL_COMMUNITY): Payer: Self-pay | Admitting: Occupational Therapy

## 2016-10-23 DIAGNOSIS — R29898 Other symptoms and signs involving the musculoskeletal system: Secondary | ICD-10-CM

## 2016-10-23 DIAGNOSIS — M6281 Muscle weakness (generalized): Secondary | ICD-10-CM

## 2016-10-23 DIAGNOSIS — R262 Difficulty in walking, not elsewhere classified: Secondary | ICD-10-CM | POA: Diagnosis not present

## 2016-10-23 DIAGNOSIS — R278 Other lack of coordination: Secondary | ICD-10-CM

## 2016-10-23 DIAGNOSIS — R2681 Unsteadiness on feet: Secondary | ICD-10-CM

## 2016-10-23 NOTE — Therapy (Signed)
[place], [place], [address] Phone: [phone]   Fax:  [phone]  Occupational Therapy Treatment  Patient Details  Name: April Page MRN: [id number] Date of Birth: [birth year] Referring Provider: Dr. Redmond School  Encounter Date: 10/23/2016      OT End of Session - 10/23/16 1513    Visit Number 5   Number of Visits 16   Date for OT Re-Evaluation 11/26/16  mini-reassessment 10/25/2016   Authorization Type [employer]   Authorization Time Period Before 10th visit   Authorization - Visit Number 5   Authorization - Number of Visits 10   OT Start Time 1346   OT Stop Time 1430   OT Time Calculation (min) 44 min   Activity Tolerance Patient tolerated treatment well   Behavior During Therapy [facility] for tasks assessed/performed      Past Medical History:  Diagnosis Date  . Arthritis    stiff knees  . Blood dyscrasia   . Cancer ([place])   . CVA (cerebral vascular accident) ([place])   . GERD (gastroesophageal reflux disease)   . H/O: CML (chronic myeloid leukemia)   . Hyperlipidemia   . Hyperlipidemia   . Hypertension   . Obesity   . Pessary maintenance 07/01/2013  . Stroke ([place])    x 3    Past Surgical History:  Procedure Laterality Date  . ABDOMINAL HYSTERECTOMY    . CHOLECYSTECTOMY    . COLONOSCOPY  06/09/2002   NTI:RWERXVQM hemorrhoids; otherwise normal rectum, colon   . COLONOSCOPY N/A 08/25/2013   GQQ:PYPPJKD diverticulosis. Single colonic polyp-removed  s/p segmental biopsy and stool sample. random colon bx negative. +benign leiomyoma.  . cystocele/rectocele repair  2009  . ESOPHAGOGASTRODUODENOSCOPY  06/09/2002   TOI:ZTIWPY upper gastrointestinal tract s/p  54-French Maloney dilator  . FEMUR IM NAIL Right 04/12/2016   Procedure: INTRAMEDULLARY (IM) NAIL FEMORAL;  Surgeon: Nicholes Stairs, MD;  Location: [place];  Service: Orthopedics;  Laterality: Right;  . HERNIA REPAIR    . LOOP RECORDER  INSERTION N/A 08/23/2016   Procedure: Loop Recorder Insertion;  Surgeon: Constance Haw, MD;  Location: [place] CV LAB;  Service: Cardiovascular;  Laterality: N/A;  . OOPHORECTOMY    . TEE WITHOUT CARDIOVERSION N/A 08/23/2016   Procedure: TRANSESOPHAGEAL ECHOCARDIOGRAM (TEE);  Surgeon: Fay Records, MD;  Location: [place];  Service: Cardiovascular;  Laterality: N/A;    There were no vitals filed for this visit.      Subjective Assessment - 10/23/16 1347    Subjective  S: So far so good.    Currently in Pain? No/denies            [facility] OT Assessment - 10/23/16 1347      Assessment   Diagnosis s/p CVA     Precautions   Precautions Fall                  OT Treatments/Exercises (OP) - 10/23/16 1348      Exercises   Exercises Shoulder;Elbow;Hand     Shoulder Exercises: Seated   Protraction Strengthening;10 reps   Protraction Weight (lbs) 1   Horizontal ABduction Strengthening;10 reps   Horizontal ABduction Weight (lbs) 1   External Rotation Strengthening;10 reps   External Rotation Weight (lbs) 1   Internal Rotation Strengthening;10 reps   Internal Rotation Weight (lbs) 1   Flexion Strengthening;10 reps   Flexion Weight (lbs) 1   Abduction AROM;10 reps     Additional  Elbow Exercises   Hand Gripper with Large Beads right hand: 25# all beads; left hand 25# all beads   Hand Gripper with Medium Beads right hand: 28# 7 beads; left hand 28# 6 beads   Hand Gripper with Small Beads right hand: 22# 8 beads; left hand 22# 7 beads     Fine Motor Coordination   Fine Motor Coordination Grooved pegs   Grooved pegs Pt used right hand to place pegs into pegboard. Mod difficulty manipulating pegs into holes. Increased time required for task, pt placed 5 pegs total                   OT Short Term Goals - 10/01/16 1319      OT SHORT TERM GOAL #1   Title Pt will be provided with and educated on HEP for improved independence during ADL completion.     Time 4   Period Weeks   Status On-going     OT SHORT TERM GOAL #2   Title Pt will improve independence in ADL completion by decreasing level of assistance required during bathing tasks from mod/max to minimal.    Time 4   Period Weeks   Status On-going     OT SHORT TERM GOAL #3   Title Pt will improve BUE grip strength by 6# and pinch strength by 2# to improve ability to grasp and hold grooming items.    Time 4   Period Weeks   Status On-going     OT SHORT TERM GOAL #4   Title Pt will improve right fine motor coordination required for ADL completion by completing 9 hole peg test in less than 1'.    Time 4   Period Weeks   Status On-going           OT Long Term Goals - 10/01/16 1319      OT LONG TERM GOAL #1   Title Pt will return to highest level of functioning and independence in B/ADL tasks, completing tasks at supervision level or better.    Time 8   Period Weeks   Status On-going     OT LONG TERM GOAL #2   Title Pt will improve BUE strength to 4+/5 to improve ability to complete ADL tasks with greater independence.    Time 8   Period Weeks   Status On-going     OT LONG TERM GOAL #3   Title Pt will improve BUE grip strength by 12# and pinch strength by 4# to improve ability to hold and utilize items for light meal preparation.    Time 8   Period Weeks   Status On-going     OT LONG TERM GOAL #4   Title Pt will improve right fine motor coordination by completing 9 hole peg test in 45" or less.    Time 8   Period Weeks   Status On-going               Plan - 10/23/16 1421    Clinical Impression Statement A: Resumed grip strengthening this session pt able to increase gripper strength for medium beads, however unable to complete all beads for medium or small beads. Added grooved pegboard, pt with difficulty due to coordination deficits as well as keeping arm one table, limiting mobility required for placing pegs. Verbal cuing for technique during tasks.     Plan P: Mini-reassessment; continue with BUE strengthening, complete pinch task and coordination task.    OT Home Exercise  Plan 7/20: red theraputty for grip and pinch strengthening, fine motor coordination tasks   Consulted and Agree with Plan of Care Patient      Patient will benefit from skilled therapeutic intervention in order to improve the following deficits and impairments:  Decreased activity tolerance, Decreased strength, Impaired UE functional use, Decreased coordination  Visit Diagnosis: Other lack of coordination  Muscle weakness (generalized)  Other symptoms and signs involving the musculoskeletal system    Problem List Patient Active Problem List   Diagnosis Date Noted  . Controlled diabetes mellitus type 2 with complications ([place]) 58/85/0277  . Physical deconditioning   . Acute ischemic stroke ([place]) 08/20/2016  . Pressure injury of skin 08/17/2016  . Acute CVA (cerebrovascular accident) ([place]) 08/17/2016  . UTI (urinary tract infection) 08/16/2016  . Constipation 07/10/2016  . Dysphagia as late effect of cerebrovascular accident (CVA) 07/10/2016  . Loss of weight 07/10/2016  . Impaired glucose tolerance 06/05/2016  . Slurred speech   . Hypokalemia 04/13/2016  . Hyperglycemia 04/13/2016  . Essential hypertension 04/13/2016  . Hyperlipidemia 04/13/2016  . S/P right hip fracture 04/12/2016  . Hip fracture ([place]) 04/12/2016  . Fall   . Itching in the vaginal area 10/05/2014  . Chronic myelogenous leukemia (CML), BCR-ABL1-positive ([place])   . GERD (gastroesophageal reflux disease) 12/06/2013  . Leukocytosis 08/06/2013  . Atypical chest pain 08/06/2013  . Chronic diarrhea 08/05/2013  . Pessary maintenance 07/01/2013   Guadelupe Sabin, OTR/L  [phone] 10/23/2016, 3:14 PM  [facility] [address] [place], [place], [address] Phone: [phone]   Fax:  [phone]  Name: April Page MRN: [id number] Date  of Birth: [birth year]

## 2016-10-23 NOTE — Therapy (Signed)
[facility] [place], [place], [address] Phone: [phone]   Fax:  [phone]  Physical Therapy Treatment  Patient Details  Name: April Page MRN: [id number] Date of Birth: 1946/01/26 Referring Provider: [employer]   Encounter Date: 10/23/2016      PT End of Session - 10/23/16 1308    Visit Number 7   Number of Visits 17   Date for PT Re-Evaluation 10/24/16   Authorization Type [employer] H[employer]   Authorization Time Period 09/26/16 to 11/27/16   Authorization - Visit Number 7   Authorization - Number of Visits 10   PT Start Time 5916   PT Stop Time 1346   PT Time Calculation (min) 48 min   Equipment Utilized During Treatment Gait belt   Activity Tolerance Patient tolerated treatment well;Patient limited by fatigue   Behavior During Therapy [facility] for tasks assessed/performed      Past Medical History:  Diagnosis Date  . Arthritis    stiff knees  . Blood dyscrasia   . Cancer ([place])   . CVA (cerebral vascular accident) ([place])   . GERD (gastroesophageal reflux disease)   . H/O: CML (chronic myeloid leukemia)   . Hyperlipidemia   . Hyperlipidemia   . Hypertension   . Obesity   . Pessary maintenance 07/01/2013  . Stroke ([place])    x 3    Past Surgical History:  Procedure Laterality Date  . ABDOMINAL HYSTERECTOMY    . CHOLECYSTECTOMY    . COLONOSCOPY  06/09/2002   BWG:YKZLDJTT hemorrhoids; otherwise normal rectum, colon   . COLONOSCOPY N/A 08/25/2013   SVX:BLTJQZE diverticulosis. Single colonic polyp-removed  s/p segmental biopsy and stool sample. random colon bx negative. +benign leiomyoma.  . cystocele/rectocele repair  2009  . ESOPHAGOGASTRODUODENOSCOPY  06/09/2002   SPQ:ZRAQTM upper gastrointestinal tract s/p  54-French Maloney dilator  . FEMUR IM NAIL Right 04/12/2016   Procedure: INTRAMEDULLARY (IM) NAIL FEMORAL;  Surgeon: Nicholes Stairs, MD;  Location: [facility];  Service: Orthopedics;  Laterality:  Right;  . HERNIA REPAIR    . LOOP RECORDER INSERTION N/A 08/23/2016   Procedure: Loop Recorder Insertion;  Surgeon: Constance Haw, MD;  Location: [place] CV LAB;  Service: Cardiovascular;  Laterality: N/A;  . OOPHORECTOMY    . TEE WITHOUT CARDIOVERSION N/A 08/23/2016   Procedure: TRANSESOPHAGEAL ECHOCARDIOGRAM (TEE);  Surgeon: Fay Records, MD;  Location: [facility];  Service: Cardiovascular;  Laterality: N/A;    There were no vitals filed for this visit.      Subjective Assessment - 10/23/16 1307    Subjective No reports of pain today.  Reports she had a good night sleep and has been compliant with HEP daily.   Pertinent History HTN, CVA March 2018, DM, R hip fractures, leukemia, R femur IM nail s/p fall 04/12/16, multiple CVAs this year    Patient Stated Goals to regain independence    Currently in Pain? No/denies                         [facility] Adult PT Treatment/Exercise - 10/23/16 0001      Ambulation/Gait   Ambulation Distance (Feet) 552 Feet   Assistive device Rolling walker   Gait Pattern Step-through pattern   Ambulation Surface Level;Indoor   Gait velocity slow   Gait Comments Cueing to reduce UE weight bearing and to push forward vs push down, increased stride length     Knee/Hip Exercises:  Aerobic   Nustep Nustep L2 UE/LE goal of 45 SPM; average at 234 spm     Knee/Hip Exercises: Standing   Wall Squat 10 reps;3 seconds   Gait Training gait training for increased gait speed,      Knee/Hip Exercises: Seated   Sit to Sand 2 sets;5 reps;without UE support  elevated height     Knee/Hip Exercises: Supine   Bridges 2 sets;10 reps   Bridges Limitations RTB around knee cueing for form and technique     Knee/Hip Exercises: Sidelying   Hip ABduction Both;1 set;10 reps   Hip ABduction Limitations therapist facilitation for form                  PT Short Term Goals - 09/26/16 1132      PT SHORT TERM GOAL #1   Title Patient to be  able to complete all functional mobility, including bed mobility, with Mod(I) in order to improve functional independence    Time 4   Period Weeks   Status New     PT SHORT TERM GOAL #2   Title Patient to be able to ambulate at gait speed of at least 0.7m/s with walker in order to improve access to home and community    Baseline 0.12m/s at eval    Time 4   Period Weeks   Status New     PT SHORT TERM GOAL #3   Title Patient to be able to complete 5 time sit to stand test in 30 seconds with UEs in order to show improved functional mobility and strength    Time 4   Period Weeks   Status New     PT SHORT TERM GOAL #4   Title Patient to be able to correctly and consistently perform HEP at least 5 days per week, to be updated as appropriate    Time 1   Period Weeks   Status New           PT Long Term Goals - 09/26/16 1136      PT LONG TERM GOAL #1   Title Patient to show functional strength as being 4+/5 in order to improve mobilty and balance    Time 8   Period Weeks   Status New     PT LONG TERM GOAL #2   Title Patient to be able to maintain tandem stance for at least 20 seconds bilaterally on solid surface and TUG test in 20 seconds with walker in order to show imrpoved balance and reduced fall risk    Time 8   Period Weeks   Status New     PT LONG TERM GOAL #3   Title Paitent to be able to ascend and descend full flight of stairs with U railing and min guard, step to pattern, no unsteadiness or fatigue, in order to improve home and community access    Time 8   Period Weeks   Status New     PT LONG TERM GOAL #4   Title Patient to be able to ambulate at at least 0.3m/s with walker in order to improve access to home and community, also to show general reduced fall risk    Time 8   Period Weeks   Status New               Plan - 10/23/16 1413    Clinical Impression Statement Began session on Nustep for strengthening with min--mod cueing to increase speed with  goal  of 45 SPM, average 34 SPM.  Therex focus on proximal strengthening to improve hip extension with gait.  Pt continues to use heavy UE weight pushing down onto RW, educated on technqiues to improve pushing walker forward vs pushing down, moderate cueing to improve.  Therapist facilitation to stay on task and forn with therex through session   Rehab Potential Good   Clinical Impairments Affecting Rehab Potential (+) supportive family; (-) hx of fall with serious injury, multiple recent CVAs   PT Frequency 2x / week   PT Duration 8 weeks   PT Treatment/Interventions ADLs/Self Care Home Management;Biofeedback;Cryotherapy;DME Instruction;Gait training;Stair training;Functional mobility training;Therapeutic activities;Therapeutic exercise;Balance training;Neuromuscular re-education;Patient/family education;Manual techniques;Energy conservation;Taping   PT Next Visit Plan Reassess next session.     PT Home Exercise Plan Eval: bridges, supine hip ABD with red TB, LAQs with red TB       Patient will benefit from skilled therapeutic intervention in order to improve the following deficits and impairments:  Abnormal gait, Decreased coordination, Decreased mobility, Decreased activity tolerance, Decreased strength, Decreased balance, Decreased safety awareness, Difficulty walking  Visit Diagnosis: Other lack of coordination  Muscle weakness (generalized)  Difficulty in walking, not elsewhere classified  Unsteadiness on feet     Problem List Patient Active Problem List   Diagnosis Date Noted  . Controlled diabetes mellitus type 2 with complications ([place]) 29/56/2130  . Physical deconditioning   . Acute ischemic stroke ([place]) 08/20/2016  . Pressure injury of skin 08/17/2016  . Acute CVA (cerebrovascular accident) ([place]) 08/17/2016  . UTI (urinary tract infection) 08/16/2016  . Constipation 07/10/2016  . Dysphagia as late effect of cerebrovascular accident (CVA) 07/10/2016  . Loss of weight  07/10/2016  . Impaired glucose tolerance 06/05/2016  . Slurred speech   . Hypokalemia 04/13/2016  . Hyperglycemia 04/13/2016  . Essential hypertension 04/13/2016  . Hyperlipidemia 04/13/2016  . S/P right hip fracture 04/12/2016  . Hip fracture ([place]) 04/12/2016  . Fall   . Itching in the vaginal area 10/05/2014  . Chronic myelogenous leukemia (CML), BCR-ABL1-positive ([place])   . GERD (gastroesophageal reflux disease) 12/06/2013  . Leukocytosis 08/06/2013  . Atypical chest pain 08/06/2013  . Chronic diarrhea 08/05/2013  . Pessary maintenance 07/01/2013   Ihor Austin, Christian; Loco  Aldona Lento 10/23/2016, 3:42 PM  [facility] [address] [place], [place], [address] Phone: [phone]   Fax:  [phone]  Name: April Page MRN: [id number] Date of Birth: 1945/06/26

## 2016-10-25 ENCOUNTER — Encounter (HOSPITAL_COMMUNITY): Payer: Self-pay | Admitting: Occupational Therapy

## 2016-10-25 ENCOUNTER — Ambulatory Visit (HOSPITAL_COMMUNITY): Payer: Medicare HMO | Admitting: Occupational Therapy

## 2016-10-25 ENCOUNTER — Ambulatory Visit (HOSPITAL_COMMUNITY): Payer: Medicare HMO

## 2016-10-25 DIAGNOSIS — R278 Other lack of coordination: Secondary | ICD-10-CM

## 2016-10-25 DIAGNOSIS — R262 Difficulty in walking, not elsewhere classified: Secondary | ICD-10-CM

## 2016-10-25 DIAGNOSIS — R2681 Unsteadiness on feet: Secondary | ICD-10-CM | POA: Diagnosis not present

## 2016-10-25 DIAGNOSIS — M6281 Muscle weakness (generalized): Secondary | ICD-10-CM

## 2016-10-25 DIAGNOSIS — R29898 Other symptoms and signs involving the musculoskeletal system: Secondary | ICD-10-CM

## 2016-10-25 NOTE — Therapy (Signed)
[place], [place], [address] Phone: [phone]   Fax:  [phone]  Physical Therapy Treatment  Patient Details  Name: April Page MRN: [id number] Date of Birth: [birth year] Referring Provider: Dr. Gerarda Fraction   Encounter Date: 10/25/2016      PT End of Session - 10/25/16 1345    Visit Number 8   Number of Visits 17   Date for PT Re-Evaluation 11/24/16   Authorization Type [employer] HMO   Authorization Time Period 09/26/16 to 11/27/16   Authorization - Visit Number 8   Authorization - Number of Visits 19   PT Start Time 5009   PT Stop Time 1348   PT Time Calculation (min) 44 min   Equipment Utilized During Treatment Gait belt   Activity Tolerance Patient tolerated treatment well;No increased pain   Behavior During Therapy WFL for tasks assessed/performed      Past Medical History:  Diagnosis Date  . Arthritis    stiff knees  . Blood dyscrasia   . Cancer ([place])   . CVA (cerebral vascular accident) ([place])   . GERD (gastroesophageal reflux disease)   . H/O: CML (chronic myeloid leukemia)   . Hyperlipidemia   . Hyperlipidemia   . Hypertension   . Obesity   . Pessary maintenance 07/01/2013  . Stroke ([place])    x 3    Past Surgical History:  Procedure Laterality Date  . ABDOMINAL HYSTERECTOMY    . CHOLECYSTECTOMY    . COLONOSCOPY  06/09/2002   FGH:WEXHBZJI hemorrhoids; otherwise normal rectum, colon   . COLONOSCOPY N/A 08/25/2013   RCV:ELFYBOF diverticulosis. Single colonic polyp-removed  s/p segmental biopsy and stool sample. random colon bx negative. +benign leiomyoma.  . cystocele/rectocele repair  2009  . ESOPHAGOGASTRODUODENOSCOPY  06/09/2002   BPZ:WCHENI upper gastrointestinal tract s/p  54-French Maloney dilator  . FEMUR IM NAIL Right 04/12/2016   Procedure: INTRAMEDULLARY (IM) NAIL FEMORAL;  Surgeon: Nicholes Stairs, MD;  Location: [facility];  Service: Orthopedics;  Laterality: Right;  .  HERNIA REPAIR    . LOOP RECORDER INSERTION N/A 08/23/2016   Procedure: Loop Recorder Insertion;  Surgeon: Constance Haw, MD;  Location: [place] CV LAB;  Service: Cardiovascular;  Laterality: N/A;  . OOPHORECTOMY    . TEE WITHOUT CARDIOVERSION N/A 08/23/2016   Procedure: TRANSESOPHAGEAL ECHOCARDIOGRAM (TEE);  Surgeon: Fay Records, MD;  Location: [facility];  Service: Cardiovascular;  Laterality: N/A;    There were no vitals filed for this visit.      Subjective Assessment - 10/25/16 1312    Subjective Pt reports since startin gwith OPPT 4WA, she notices some improvements. She says her walking is different, now she is not affraid to make big steps. She reports her ADL are easier, her effort is decreased, and she is more statisfied then at eval.    Pertinent History HTN, CVA March 2018, DM, R hip fractures, leukemia, R femur IM nail s/p fall 04/12/16, multiple CVAs this year    How long can you sit comfortably? unlimited    How long can you stand comfortably? not sure    How long can you walk comfortably? 8-10 minutes   Currently in Pain? No/denies            [facility] PT Assessment - 10/25/16 0001      Assessment   Medical Diagnosis CVA    Referring Provider Dr. Gerarda Fraction    Onset Date/Surgical Date 08/18/16   Hand Dominance  Right   Next MD Visit unsure   Prior Therapy [facility], prior OPPT for hip fractures     Precautions   Precautions Fall     Balance Screen   Has the patient fallen in the past 6 months No   Has the patient had a decrease in activity level because of a fear of falling?  No   Is the patient reluctant to leave their home because of a fear of falling?  No     Home Environment   Living Environment Private residence   Living Arrangements Alone   Available Help at Discharge Available PRN/intermittently  friend stays overnight 4n/week; daughters overnight on [place]   Type of Home Apartment   Home Access Level entry   [place] - 2 wheels;Shower  seat     Prior Function   Vocation Retired     Strength   Right Hip Flexion 4-/5   Right Hip External Rotation  3+/5   Right Hip Internal Rotation 5/5   Left Hip Flexion 4/5   Left Hip External Rotation 5/5   Left Hip Internal Rotation 4/5   Right Knee Flexion 4-/5   Right Knee Extension 5/5   Left Knee Flexion 5/5   Left Knee Extension 5/5   Right Ankle Dorsiflexion 4+/5  lacks ~20 degrees   Left Ankle Dorsiflexion 5/5  lacks ~20 degrees     Transfers   Five time sit to stand comments  unable to test, pt cannot come to standing hands free from elevated surface     Ambulation/Gait   Ambulation Distance (Feet) 175 Feet   Assistive device Rolling walker   Gait Pattern --  3-point gait, step throught on Lt, Step-to on Rt   Gait velocity 0.24ms   Gait Comments distance limited due to time. [place] AMB time require to get to room is >7 minutes     Timed Up and Go Test   TUG Normal TUG   Normal TUG (seconds) 62  c RW;                                PT Short Term Goals - 10/25/16 1336      PT SHORT TERM GOAL #1   Title Patient to be able to complete all functional mobility, including bed mobility, with Mod(I) in order to improve functional independence    Time 4   Period Weeks   Status Achieved     PT SHORT TERM GOAL #2   Title Patient to be able to ambulate at gait speed of at least 0.441m with walker in order to improve access to home and community    Baseline 0.1019mat eval; 0.60m30mt reassessment    Time 4   Period Weeks   Status On-going     PT SHORT TERM GOAL #3   Title Patient to be able to complete 5 time sit to stand test in 30 seconds with UEs in order to show improved functional mobility and strength    Baseline unable to perform in <30second safely with UE and elevated surface.    Time 4   Period Weeks   Status On-going     PT SHORT TERM GOAL #4   Title Patient to be able to correctly and consistently perform HEP at least 5  days per week, to be updated as appropriate    Time 1   Period Weeks   Status  Achieved           PT Long Term Goals - 10/25/16 1339      PT LONG TERM GOAL #1   Title Patient to show functional strength as being 4+/5 in order to improve mobilty and balance    Baseline many groups are 4+-5/5 but isolated deficits on Right hamstrings, abductors, and external rotators; gross hip extension weakness bilat   Time 8   Period Weeks   Status Partially Met     PT LONG TERM GOAL #2   Title Patient to be able to maintain tandem stance for at least 20 seconds bilaterally on solid surface and TUG test in 20 seconds with walker in order to show imrpoved balance and reduced fall risk    Baseline TUG (8/17) 62s; tandem attempt limited/unsafe at this time.    Time 8   Period Weeks   Status On-going     PT LONG TERM GOAL #3   Title Paitent to be able to ascend and descend full flight of stairs with U railing and min guard, step to pattern, no unsteadiness or fatigue, in order to improve home and community access    Baseline 5/31- 32   Time 8   Period Weeks   Status Deferred  patient has no stairs in home nor at egress     PT LONG TERM GOAL #4   Title Patient to be able to ambulate at at least 0.52ms with walker in order to improve access to home and community, also to show general reduced fall risk    Baseline 0.140m on 8/17   Time 8   Period Weeks   Status On-going               Plan - 10/25/16 1742    Clinical Impression Statement Reassessment performed this date, 4 weeks into the patients 8 week certification. The patient has met 2 of 4 short term goals, and has made progress toward some of her long term goals. Most notable are increases in isolated strength in BLE, and subjectively patient reports improved confidence and decreased apprahension with AMB. Gait speed remains slow, without significant increase since evaluation. Functional strength remains largely unchanged as the  patient continues to struggle with rising from a standard height chair. These two areas will remain a strong focus moving forward. Overall patient is in good spirits, expressing a subjective report that is concordant with objective measures taken this date.    History and Personal Factors relevant to plan of care: Multiple recent CVA, prior Rt hip fracture with conitnued weakness.    Clinical Presentation Stable   Clinical Decision Making Moderate   Rehab Potential Good   Clinical Impairments Affecting Rehab Potential (+) supportive family; (-) hx of fall with serious injury, multiple recent CVAs   PT Frequency 2x / week   PT Duration 8 weeks   PT Treatment/Interventions ADLs/Self Care Home Management;Biofeedback;Cryotherapy;DME Instruction;Gait training;Stair training;Functional mobility training;Therapeutic activities;Therapeutic exercise;Balance training;Neuromuscular re-education;Patient/family education;Manual techniques;Energy conservation;Taping   PT Next Visit Plan Continue with current program; progress AMB activity in place of nustep for improved funcitonal carryover, targets hip extension strengtheing and Rt hip strengthening.    PT Home Exercise Plan Eval: bridges, supine hip ABD with red TB, LAQs with red TB    Consulted and Agree with Plan of Care Patient      Patient will benefit from skilled therapeutic intervention in order to improve the following deficits and impairments:  Abnormal gait, Decreased coordination, Decreased mobility,  Decreased activity tolerance, Decreased strength, Decreased balance, Decreased safety awareness, Difficulty walking  Visit Diagnosis: Other lack of coordination  Muscle weakness (generalized)  Other symptoms and signs involving the musculoskeletal system  Difficulty in walking, not elsewhere classified  Unsteadiness on feet       G-Codes - 2016-11-06 1748    Functional Assessment Tool Used (Outpatient Only) Based on skilled clinical  assessment of strength, balance, gait pattern, fall risk    Functional Limitation Mobility: Walking and moving around   Mobility: Walking and Moving Around Current Status ([id number]) At least 60 percent but less than 80 percent impaired, limited or restricted   Mobility: Walking and Moving Around Goal Status [phone]) At least 40 percent but less than 60 percent impaired, limited or restricted      Problem List Patient Active Problem List   Diagnosis Date Noted  . Controlled diabetes mellitus type 2 with complications ([place]) 67/02/4579  . Physical deconditioning   . Acute ischemic stroke ([place]) 08/20/2016  . Pressure injury of skin 08/17/2016  . Acute CVA (cerebrovascular accident) ([place]) 08/17/2016  . UTI (urinary tract infection) 08/16/2016  . Constipation 07/10/2016  . Dysphagia as late effect of cerebrovascular accident (CVA) 07/10/2016  . Loss of weight 07/10/2016  . Impaired glucose tolerance 06/05/2016  . Slurred speech   . Hypokalemia 04/13/2016  . Hyperglycemia 04/13/2016  . Essential hypertension 04/13/2016  . Hyperlipidemia 04/13/2016  . S/P right hip fracture 04/12/2016  . Hip fracture ([place]) 04/12/2016  . Fall   . Itching in the vaginal area 10/05/2014  . Chronic myelogenous leukemia (CML), BCR-ABL1-positive ([place])   . GERD (gastroesophageal reflux disease) 12/06/2013  . Leukocytosis 08/06/2013  . Atypical chest pain 08/06/2013  . Chronic diarrhea 08/05/2013  . Pessary maintenance 07/01/2013   5:51 PM, 11/06/2016 Etta Grandchild, PT, DPT Physical Therapist at [facility] [phone] (office)     Etta Grandchild 2016/11/06, 5:49 PM  [facility] [address] [place], [place], [address] Phone: [phone]   Fax:  [phone]  Name: April Page MRN: [id number] Date of Birth: 04/21/1945

## 2016-10-25 NOTE — Therapy (Signed)
[place], [place], [address] Phone: [phone]   Fax:  [phone]  Occupational Therapy Treatment (and mini-reassessment)  Patient Details  Name: April Page MRN: [id number] Date of Birth: Jul 29, 1945 Referring Provider: Dr. Redmond School  Encounter Date: 10/25/2016      OT End of Session - 10/25/16 1436    Visit Number 6   Number of Visits 16   Date for OT Re-Evaluation 11/26/16   Authorization Type [employer]   Authorization Time Period Before 10th visit   Authorization - Visit Number 6   Authorization - Number of Visits 10   OT Start Time 3888   OT Stop Time 1430   OT Time Calculation (min) 43 min   Activity Tolerance Patient tolerated treatment well   Behavior During Therapy [facility] for tasks assessed/performed      Past Medical History:  Diagnosis Date  . Arthritis    stiff knees  . Blood dyscrasia   . Cancer ([place])   . CVA (cerebral vascular accident) ([place])   . GERD (gastroesophageal reflux disease)   . H/O: CML (chronic myeloid leukemia)   . Hyperlipidemia   . Hyperlipidemia   . Hypertension   . Obesity   . Pessary maintenance 07/01/2013  . Stroke ([place])    x 3    Past Surgical History:  Procedure Laterality Date  . ABDOMINAL HYSTERECTOMY    . CHOLECYSTECTOMY    . COLONOSCOPY  06/09/2002   KCM:KLKJZPHX hemorrhoids; otherwise normal rectum, colon   . COLONOSCOPY N/A 08/25/2013   TAV:WPVXYIA diverticulosis. Single colonic polyp-removed  s/p segmental biopsy and stool sample. random colon bx negative. +benign leiomyoma.  . cystocele/rectocele repair  2009  . ESOPHAGOGASTRODUODENOSCOPY  06/09/2002   XKP:VVZSMO upper gastrointestinal tract s/p  54-French Maloney dilator  . FEMUR IM NAIL Right 04/12/2016   Procedure: INTRAMEDULLARY (IM) NAIL FEMORAL;  Surgeon: Nicholes Stairs, MD;  Location: [place];  Service: Orthopedics;  Laterality: Right;  . HERNIA REPAIR    . LOOP RECORDER  INSERTION N/A 08/23/2016   Procedure: Loop Recorder Insertion;  Surgeon: Constance Haw, MD;  Location: [place] [facility];  Service: Cardiovascular;  Laterality: N/A;  . OOPHORECTOMY    . TEE WITHOUT CARDIOVERSION N/A 08/23/2016   Procedure: TRANSESOPHAGEAL ECHOCARDIOGRAM (TEE);  Surgeon: Fay Records, MD;  Location: [place];  Service: Cardiovascular;  Laterality: N/A;    There were no vitals filed for this visit.      Subjective Assessment - 10/25/16 1341    Subjective  S: That last exercise we did on Wednesday was annoying.    Currently in Pain? No/denies            [facility] OT Assessment - 10/25/16 1340      Assessment   Diagnosis s/p CVA     Precautions   Precautions Fall     Coordination   9 Hole Peg Test Right   Right [address] Peg Test 1'  1'21" previous     Strength   Strength Assessment Site Shoulder;Elbow;Hand   Right Shoulder Flexion 4/5  same as previous   Right Shoulder ABduction 4-/5  3+/5 previous   Right Shoulder Internal Rotation 4-/5  same as previous   Right Shoulder External Rotation 4-/5  same as previous   Left Shoulder Flexion 4/5  same as previous   Left Shoulder ABduction 4/5  same as previous   Left Shoulder Internal Rotation 4/5  same as previous  Left Shoulder External Rotation 4-/5  same as previous   Right Elbow Flexion 4/5  4-/5 previous   Right Elbow Extension 4-/5  same as previous   Left Elbow Flexion 4/5  same as previous   Left Elbow Extension 4/5  same as previous   Right Hand Grip (lbs) 39  20 previous   Right Hand Lateral Pinch 11 lbs  7 previous   Right Hand 3 Point Pinch 5 lbs  2 previous   Left Hand Grip (lbs) 32  30 previous   Left Hand Lateral Pinch 13 lbs  10 previous   Left Hand 3 Point Pinch 10 lbs  7 previous                  OT Treatments/Exercises (OP) - 10/25/16 1359      Exercises   Exercises Shoulder;Elbow;Hand     Additional Elbow Exercises   Hand Gripper with Large  Beads right hand: 29# all beads; left hand 28# all beads   Hand Gripper with Medium Beads right hand: 25#; left hand 25#   Hand Gripper with Small Beads right: 22# all beads; left 22# all beads     Hand Exercises   Sponges Pt used red clothespin to grasp and place 20 sponges into bucket using left hand and 3 point pinch. Pt then completed task with right hand, using red clothespin for 5 sponges and yellow for remaining 15 sponges                  OT Short Term Goals - 10/25/16 1435      OT SHORT TERM GOAL #1   Title Pt will be provided with and educated on HEP for improved independence during ADL completion.    Time 4   Period Weeks   Status On-going     OT SHORT TERM GOAL #2   Title Pt will improve independence in ADL completion by decreasing level of assistance required during bathing tasks from mod/max to minimal.    Time 4   Period Weeks   Status On-going     OT SHORT TERM GOAL #3   Title Pt will improve BUE grip strength by 6# and pinch strength by 2# to improve ability to grasp and hold grooming items.    Time 4   Period Weeks   Status Partially Met     OT SHORT TERM GOAL #4   Title Pt will improve right fine motor coordination required for ADL completion by completing 9 hole peg test in less than 1'.    Time 4   Period Weeks   Status Achieved           OT Long Term Goals - 10/25/16 1435      OT LONG TERM GOAL #1   Title Pt will return to highest level of functioning and independence in B/ADL tasks, completing tasks at supervision level or better.    Time 8   Period Weeks   Status On-going     OT LONG TERM GOAL #2   Title Pt will improve BUE strength to 4+/5 to improve ability to complete ADL tasks with greater independence.    Time 8   Period Weeks   Status On-going     OT LONG TERM GOAL #3   Title Pt will improve BUE grip strength by 12# and pinch strength by 4# to improve ability to hold and utilize items for light meal preparation.    Time 8  Period Weeks   Status Partially Met     OT LONG TERM GOAL #4   Title Pt will improve right fine motor coordination by completing 9 hole peg test in 45" or less.    Time 8   Period Weeks   Status On-going               Plan - 10/25/16 1436    Clinical Impression Statement A: Mini-reassessment completed this session, pt has met 1/4 STGs, partially met an additional STG, and has partially met 1 LTG. Pt has made improvements in strength and coordination, however continues to have limitations in BUE strength, grip and pinch strength, and coordination limiting independence and safety during B/ADL completion. During session today pt demonstrates significant difficulty with right hand during grip and pinch activities, requiring consistent cuing for technique during activity completion. Pt requires increased time for all tasks, rest breaks intermittently for fatigue.    Plan P: Complete BUE strengthening first, coordination task with right hands. Update HEP for BUE strengthening if pt able to complete exercises with correct form   OT Home Exercise Plan 7/20: red theraputty for grip and pinch strengthening, fine motor coordination tasks   Consulted and Agree with Plan of Care Patient      Patient will benefit from skilled therapeutic intervention in order to improve the following deficits and impairments:  Decreased activity tolerance, Decreased strength, Impaired UE functional use, Decreased coordination  Visit Diagnosis: Other lack of coordination  Muscle weakness (generalized)  Other symptoms and signs involving the musculoskeletal system    Problem List Patient Active Problem List   Diagnosis Date Noted  . Controlled diabetes mellitus type 2 with complications ([place]) 74/14/2395  . Physical deconditioning   . Acute ischemic stroke ([place]) 08/20/2016  . Pressure injury of skin 08/17/2016  . Acute CVA (cerebrovascular accident) ([place]) 08/17/2016  . UTI (urinary tract infection)  08/16/2016  . Constipation 07/10/2016  . Dysphagia as late effect of cerebrovascular accident (CVA) 07/10/2016  . Loss of weight 07/10/2016  . Impaired glucose tolerance 06/05/2016  . Slurred speech   . Hypokalemia 04/13/2016  . Hyperglycemia 04/13/2016  . Essential hypertension 04/13/2016  . Hyperlipidemia 04/13/2016  . S/P right hip fracture 04/12/2016  . Hip fracture ([place]) 04/12/2016  . Fall   . Itching in the vaginal area 10/05/2014  . Chronic myelogenous leukemia (CML), BCR-ABL1-positive ([place])   . GERD (gastroesophageal reflux disease) 12/06/2013  . Leukocytosis 08/06/2013  . Atypical chest pain 08/06/2013  . Chronic diarrhea 08/05/2013  . Pessary maintenance 07/01/2013   Guadelupe Sabin, OTR/L  [phone] 10/25/2016, 2:41 PM  [facility] [address] [place], [place], [address] Phone: [phone]   Fax:  [phone]  Name: SHYENNE MAGGARD MRN: [id number] Date of Birth: 1945/08/02

## 2016-10-26 LAB — CUP PACEART REMOTE DEVICE CHECK
Date Time Interrogation Session: 20180814220608
MDC IDC PG IMPLANT DT: 20180615

## 2016-10-26 NOTE — Progress Notes (Signed)
Loop recorder summary report 

## 2016-10-26 NOTE — Progress Notes (Signed)
Carelink summary report received. Battery status OK. Normal device function. No new symptom episodes, tachy episodes, brady, or pause episodes. No new AF episodes. Monthly summary reports and ROV/PRN 

## 2016-10-29 ENCOUNTER — Ambulatory Visit (HOSPITAL_COMMUNITY): Payer: Medicare HMO

## 2016-10-29 DIAGNOSIS — R262 Difficulty in walking, not elsewhere classified: Secondary | ICD-10-CM | POA: Diagnosis not present

## 2016-10-29 DIAGNOSIS — R278 Other lack of coordination: Secondary | ICD-10-CM

## 2016-10-29 DIAGNOSIS — R2681 Unsteadiness on feet: Secondary | ICD-10-CM | POA: Diagnosis not present

## 2016-10-29 DIAGNOSIS — M6281 Muscle weakness (generalized): Secondary | ICD-10-CM

## 2016-10-29 DIAGNOSIS — R29898 Other symptoms and signs involving the musculoskeletal system: Secondary | ICD-10-CM

## 2016-10-29 NOTE — Patient Instructions (Signed)
Home Exercises Program Theraputty Exercises  Do the following exercises 1-2 times a day using your affected hand.  1. Roll putty into a ball.  2. Make into a pancake.  3. Roll putty into a roll.  4. Pinch along log with first finger and thumb.   5. Make into a ball.  6. Roll it back into a log.   7. Pinch using thumb and side of first finger.  8. Roll into a ball, then flatten into a pancake.  9. Using your fingers, make putty into a mountain.  ___________________________________________________  Repeat all exercises 10-15 times, 1-2 times per day.  1) Shoulder Protraction    Begin with elbows by your side, slowly "punch" straight out in front of you.      2) Shoulder Flexion  Standing:         Begin with arms at your side with thumbs pointed up, slowly raise both arms up and forward towards overhead.        3) Horizontal abduction/adduction   Standing:           Begin with arms straight out in front of you, bring out to the side in at "T" shape. Keep arms straight entire time.    4) Internal & External Rotation    *No band* -Stand with elbows at the side and elbows bent 90 degrees. Move your forearms away from your body, then bring back inward toward the body.     5) Shoulder Abduction  Standing:       Lying on your back begin with your arms flat on the table next to your side. Slowly move your arms out to the side so that they go overhead, in a jumping jack or snow angel movement.

## 2016-10-29 NOTE — Therapy (Signed)
[place], [place], [address] Phone: [phone]   Fax:  [phone]  Physical Therapy Treatment  Patient Details  Name: April Page MRN: [id number] Date of Birth: 1945-10-31 Referring Provider: Dr. Gerarda Fraction   Encounter Date: 10/29/2016      PT End of Session - 10/29/16 1353    Visit Number 9   Number of Visits 17   Date for PT Re-Evaluation 11/24/16   Authorization Type [employer] H[employer]   Authorization Time Period 09/26/16 to 11/27/16   Authorization - Visit Number 9   Authorization - Number of Visits 19   PT Start Time 8185   PT Stop Time 1433   PT Time Calculation (min) 47 min   Equipment Utilized During Treatment Gait belt   Activity Tolerance Patient tolerated treatment well;No increased pain   Behavior During Therapy WFL for tasks assessed/performed      Past Medical History:  Diagnosis Date  . Arthritis    stiff knees  . Blood dyscrasia   . Cancer ([place])   . CVA (cerebral vascular accident) ([place])   . GERD (gastroesophageal reflux disease)   . H/O: CML (chronic myeloid leukemia)   . Hyperlipidemia   . Hyperlipidemia   . Hypertension   . Obesity   . Pessary maintenance 07/01/2013  . Stroke ([place])    x 3    Past Surgical History:  Procedure Laterality Date  . ABDOMINAL HYSTERECTOMY    . CHOLECYSTECTOMY    . COLONOSCOPY  06/09/2002   UDJ:SHFWYOVZ hemorrhoids; otherwise normal rectum, colon   . COLONOSCOPY N/A 08/25/2013   CHY:IFOYDXA diverticulosis. Single colonic polyp-removed  s/p segmental biopsy and stool sample. random colon bx negative. +benign leiomyoma.  . cystocele/rectocele repair  2009  . ESOPHAGOGASTRODUODENOSCOPY  06/09/2002   JOI:NOMVEH upper gastrointestinal tract s/p  54-French Maloney dilator  . FEMUR IM NAIL Right 04/12/2016   Procedure: INTRAMEDULLARY (IM) NAIL FEMORAL;  Surgeon: Nicholes Stairs, MD;  Location: [facility];  Service: Orthopedics;  Laterality: Right;  .  HERNIA REPAIR    . LOOP RECORDER INSERTION N/A 08/23/2016   Procedure: Loop Recorder Insertion;  Surgeon: Constance Haw, MD;  Location: [facility];  Service: Cardiovascular;  Laterality: N/A;  . OOPHORECTOMY    . TEE WITHOUT CARDIOVERSION N/A 08/23/2016   Procedure: TRANSESOPHAGEAL ECHOCARDIOGRAM (TEE);  Surgeon: Fay Records, MD;  Location: [facility];  Service: Cardiovascular;  Laterality: N/A;    There were no vitals filed for this visit.      Subjective Assessment - 10/29/16 1352    Subjective Pt arrived with reports of minimal changes since last session, no reports of pain today.     Pertinent History HTN, CVA March 2018, DM, R hip fractures, leukemia, R femur IM nail s/p fall 04/12/16, multiple CVAs this year    Patient Stated Goals to regain independence    Currently in Pain? No/denies                         [facility] Adult PT Treatment/Exercise - 10/29/16 0001      Ambulation/Gait   Ambulation Distance (Feet) 520 Feet   Assistive device Rolling walker   Gait Pattern Step-through pattern   Ambulation Surface Level;Indoor   Gait Comments cueing to increase stride length and reduce UE pressure on walker; used SPC to increase gait speed     Knee/Hip Exercises: Aerobic   Nustep Nustep L2 UE/LE goal  of 45 SPM; average at 39/42 spm     Knee/Hip Exercises: Standing   Gait Training gait training 552 feet with cueing for stride length especially Rt LE, cueing to reduce UE pressure    Other Standing Knee Exercises sidestep 2RT in // bars; retro gait 2RT in // bars   Other Standing Knee Exercises forward step over 6 in hurdles initiially 2 HHA then reduced to 1 HHA     Knee/Hip Exercises: Seated   Sit to Sand 2 sets;5 reps;without UE support  elevated height                  PT Short Term Goals - 10/25/16 1336      PT SHORT TERM GOAL #1   Title Patient to be able to complete all functional mobility, including bed mobility, with Mod(I) in  order to improve functional independence    Time 4   Period Weeks   Status Achieved     PT SHORT TERM GOAL #2   Title Patient to be able to ambulate at gait speed of at least 0.47ms with walker in order to improve access to home and community    Baseline 0.128m at eval; 0.1259mat reassessment    Time 4   Period Weeks   Status On-going     PT SHORT TERM GOAL #3   Title Patient to be able to complete 5 time sit to stand test in 30 seconds with UEs in order to show improved functional mobility and strength    Baseline unable to perform in <30second safely with UE and elevated surface.    Time 4   Period Weeks   Status On-going     PT SHORT TERM GOAL #4   Title Patient to be able to correctly and consistently perform HEP at least 5 days per week, to be updated as appropriate    Time 1   Period Weeks   Status Achieved           PT Long Term Goals - 10/25/16 1339      PT LONG TERM GOAL #1   Title Patient to show functional strength as being 4+/5 in order to improve mobilty and balance    Baseline many groups are 4+-5/5 but isolated deficits on Right hamstrings, abductors, and external rotators; gross hip extension weakness bilat   Time 8   Period Weeks   Status Partially Met     PT LONG TERM GOAL #2   Title Patient to be able to maintain tandem stance for at least 20 seconds bilaterally on solid surface and TUG test in 20 seconds with walker in order to show imrpoved balance and reduced fall risk    Baseline TUG (8/17) 62s; tandem attempt limited/unsafe at this time.    Time 8   Period Weeks   Status On-going     PT LONG TERM GOAL #3   Title Paitent to be able to ascend and descend full flight of stairs with U railing and min guard, step to pattern, no unsteadiness or fatigue, in order to improve home and community access    Baseline 5/31- 32   Time 8   Period Weeks   Status Deferred  patient has no stairs in home nor at egress     PT LONG TERM GOAL #4   Title  Patient to be able to ambulate at at least 0.64m/49mith walker in order to improve access to home and community, also to show general  reduced fall risk    Baseline 0.47ms on 8/17   Time 8   Period Weeks   Status On-going               Plan - 10/29/16 1640    Clinical Impression Statement Session focus on improving functilnal strengthening and gait mechanics.  Pt continues to ambulate very slowly, with multimodal cueing to increase stride length and reduce UE pressure on walker.   Added hurdles as visual landmark to increase stride length and reduced to 1 HHA to improve confidence without HHA,  Pt with increased difficulty moving Lt LE with sidestep and retro gait this session.  No reports of pain through sessoin, was limited by fatigue with tasks.     Rehab Potential Good   Clinical Impairments Affecting Rehab Potential (+) supportive family; (-) hx of fall with serious injury, multiple recent CVAs   PT Frequency 2x / week   PT Duration 8 weeks   PT Treatment/Interventions ADLs/Self Care Home Management;Biofeedback;Cryotherapy;DME Instruction;Gait training;Stair training;Functional mobility training;Therapeutic activities;Therapeutic exercise;Balance training;Neuromuscular re-education;Patient/family education;Manual techniques;Energy conservation;Taping   PT Next Visit Plan Continue with current program; progress AMB activity in place of nustep for improved funcitonal carryover, targets hip extension strengtheing and Rt hip strengthening.    PT Home Exercise Plan Eval: bridges, supine hip ABD with red TB, LAQs with red TB       Patient will benefit from skilled therapeutic intervention in order to improve the following deficits and impairments:  Abnormal gait, Decreased coordination, Decreased mobility, Decreased activity tolerance, Decreased strength, Decreased balance, Decreased safety awareness, Difficulty walking  Visit Diagnosis: Other lack of coordination  Muscle weakness  (generalized)  Other symptoms and signs involving the musculoskeletal system  Difficulty in walking, not elsewhere classified     Problem List Patient Active Problem List   Diagnosis Date Noted  . Controlled diabetes mellitus type 2 with complications ([place] 012/24/8250 . Physical deconditioning   . Acute ischemic stroke ([place] 08/20/2016  . Pressure injury of skin 08/17/2016  . Acute CVA (cerebrovascular accident) ([place] 08/17/2016  . UTI (urinary tract infection) 08/16/2016  . Constipation 07/10/2016  . Dysphagia as late effect of cerebrovascular accident (CVA) 07/10/2016  . Loss of weight 07/10/2016  . Impaired glucose tolerance 06/05/2016  . Slurred speech   . Hypokalemia 04/13/2016  . Hyperglycemia 04/13/2016  . Essential hypertension 04/13/2016  . Hyperlipidemia 04/13/2016  . S/P right hip fracture 04/12/2016  . Hip fracture ([place] 04/12/2016  . Fall   . Itching in the vaginal area 10/05/2014  . Chronic myelogenous leukemia (CML), BCR-ABL1-positive ([place]   . GERD (gastroesophageal reflux disease) 12/06/2013  . Leukocytosis 08/06/2013  . Atypical chest pain 08/06/2013  . Chronic diarrhea 08/05/2013  . Pessary maintenance 07/01/2013   CIhor Austin LMidway [place]Minn[place] CAldona Lento8/21/2018, 4:51 PM  [address]ras[place] [address]Phone: [phone]  Fax:  [phone] Name: EELAYNE GRUVERMRN: [id number] of Birth: 1Nov 07, 1947

## 2016-10-29 NOTE — Therapy (Signed)
[facility] [place], [place], [address] Phone: [phone]   Fax:  [phone]  Occupational Therapy Treatment  Patient Details  Name: April Page MRN: [id number] Date of Birth: 12/11/1945 Referring Provider: Dr. Redmond School  Encounter Date: 10/29/2016      OT End of Session - 10/29/16 1544    Visit Number 7   Number of Visits 16   Date for OT Re-Evaluation 11/26/16   Authorization Type [employer]   Authorization Time Period Before 10th visit   Authorization - Visit Number 7   Authorization - Number of Visits 10   OT Start Time 1440   OT Stop Time 1517   OT Time Calculation (min) 37 min   Activity Tolerance Patient tolerated treatment well   Behavior During Therapy [facility] for tasks assessed/performed      Past Medical History:  Diagnosis Date  . Arthritis    stiff knees  . Blood dyscrasia   . Cancer ([place])   . CVA (cerebral vascular accident) ([place])   . GERD (gastroesophageal reflux disease)   . H/O: CML (chronic myeloid leukemia)   . Hyperlipidemia   . Hyperlipidemia   . Hypertension   . Obesity   . Pessary maintenance 07/01/2013  . Stroke ([place])    x 3    Past Surgical History:  Procedure Laterality Date  . ABDOMINAL HYSTERECTOMY    . CHOLECYSTECTOMY    . COLONOSCOPY  06/09/2002   DUK:GURKYHCW hemorrhoids; otherwise normal rectum, colon   . COLONOSCOPY N/A 08/25/2013   CBJ:SEGBTDV diverticulosis. Single colonic polyp-removed  s/p segmental biopsy and stool sample. random colon bx negative. +benign leiomyoma.  . cystocele/rectocele repair  2009  . ESOPHAGOGASTRODUODENOSCOPY  06/09/2002   VOH:YWVPXT upper gastrointestinal tract s/p  54-French Maloney dilator  . FEMUR IM NAIL Right 04/12/2016   Procedure: INTRAMEDULLARY (IM) NAIL FEMORAL;  Surgeon: Nicholes Stairs, MD;  Location: [place];  Service: Orthopedics;  Laterality: Right;  . HERNIA REPAIR    . LOOP RECORDER INSERTION N/A 08/23/2016   Procedure: Loop Recorder Insertion;  Surgeon: Constance Haw, MD;  Location: [place] CV LAB;  Service: Cardiovascular;  Laterality: N/A;  . OOPHORECTOMY    . TEE WITHOUT CARDIOVERSION N/A 08/23/2016   Procedure: TRANSESOPHAGEAL ECHOCARDIOGRAM (TEE);  Surgeon: Fay Records, MD;  Location: [place];  Service: Cardiovascular;  Laterality: N/A;    There were no vitals filed for this visit.                    OT Treatments/Exercises (OP) - 10/29/16 1509      Exercises   Exercises Shoulder;Hand     Shoulder Exercises: Seated   Protraction AROM;10 reps   Horizontal ABduction AROM;10 reps   External Rotation AROM;10 reps   Internal Rotation AROM;10 reps   Flexion AROM;10 reps   Abduction AROM;10 reps     Hand Exercises   Hand Gripper with Medium Beads right hand: 25# all beads   Hand Gripper with Small Beads right hand: pt was able to pick up 3 beads with gripper set at 25#                OT Education - 10/29/16 1543    Education provided Yes   Education Details HEP updated to include only theraputty and A/ROM exercises for shoulder strengthening.    Person(s) Educated Patient   Methods Explanation;Handout;Verbal cues;Demonstration   Comprehension Verbalized understanding;Returned demonstration  OT Short Term Goals - 10/29/16 1547      OT SHORT TERM GOAL #1   Title Pt will be provided with and educated on HEP for improved independence during ADL completion.    Time 4   Period Weeks   Status On-going     OT SHORT TERM GOAL #2   Title Pt will improve independence in ADL completion by decreasing level of assistance required during bathing tasks from mod/max to minimal.    Time 4   Period Weeks   Status On-going     OT SHORT TERM GOAL #3   Title Pt will improve BUE grip strength by 6# and pinch strength by 2# to improve ability to grasp and hold grooming items.    Time 4   Period Weeks   Status Partially Met     OT SHORT TERM  GOAL #4   Title Pt will improve right fine motor coordination required for ADL completion by completing 9 hole peg test in less than 1'.    Time 4   Period Weeks           OT Long Term Goals - 10/25/16 1435      OT LONG TERM GOAL #1   Title Pt will return to highest level of functioning and independence in B/ADL tasks, completing tasks at supervision level or better.    Time 8   Period Weeks   Status On-going     OT LONG TERM GOAL #2   Title Pt will improve BUE strength to 4+/5 to improve ability to complete ADL tasks with greater independence.    Time 8   Period Weeks   Status On-going     OT LONG TERM GOAL #3   Title Pt will improve BUE grip strength by 12# and pinch strength by 4# to improve ability to hold and utilize items for light meal preparation.    Time 8   Period Weeks   Status Partially Met     OT LONG TERM GOAL #4   Title Pt will improve right fine motor coordination by completing 9 hole peg test in 45" or less.    Time 8   Period Weeks   Status On-going               Plan - 10/29/16 1545    Clinical Impression Statement A: Attempted seated shoulder strengthening with 1# although patient was unable to complete with proper form and technique even with floor length mirror used. A/ROM shoulder strengthening was completed  for strengthening instead with greater success. HEP was updated. pt was instructed on which exercises to eliminate from first session and which ones to continue.    Plan P: Attempt to pick up all small beads with right with gripper set at 25#      Patient will benefit from skilled therapeutic intervention in order to improve the following deficits and impairments:  Decreased activity tolerance, Decreased strength, Impaired UE functional use, Decreased coordination  Visit Diagnosis: Other lack of coordination  Muscle weakness (generalized)  Other symptoms and signs involving the musculoskeletal system    Problem List Patient  Active Problem List   Diagnosis Date Noted  . Controlled diabetes mellitus type 2 with complications (HCC) 08/26/2016  . Physical deconditioning   . Acute ischemic stroke (HCC) 08/20/2016  . Pressure injury of skin 08/17/2016  . Acute CVA (cerebrovascular accident) (HCC) 08/17/2016  . UTI (urinary tract infection) 08/16/2016  . Constipation 07/10/2016  . Dysphagia  as late effect of cerebrovascular accident (CVA) 07/10/2016  . Loss of weight 07/10/2016  . Impaired glucose tolerance 06/05/2016  . Slurred speech   . Hypokalemia 04/13/2016  . Hyperglycemia 04/13/2016  . Essential hypertension 04/13/2016  . Hyperlipidemia 04/13/2016  . S/P right hip fracture 04/12/2016  . Hip fracture ([place]) 04/12/2016  . Fall   . Itching in the vaginal area 10/05/2014  . Chronic myelogenous leukemia (CML), BCR-ABL1-positive ([place])   . GERD (gastroesophageal reflux disease) 12/06/2013  . Leukocytosis 08/06/2013  . Atypical chest pain 08/06/2013  . Chronic diarrhea 08/05/2013  . Pessary maintenance 07/01/2013   Ailene Ravel, OTR/L,CBIS  [phone]  10/29/2016, 3:50 PM  [facility] [address] [place], [place], [address] Phone: [phone]   Fax:  [phone]  Name: April Page MRN: [id number] Date of Birth: Jul 14, 1945

## 2016-10-31 ENCOUNTER — Encounter (HOSPITAL_COMMUNITY): Payer: Self-pay | Admitting: Occupational Therapy

## 2016-10-31 ENCOUNTER — Ambulatory Visit (HOSPITAL_COMMUNITY): Payer: Medicare HMO | Admitting: Physical Therapy

## 2016-10-31 ENCOUNTER — Ambulatory Visit (HOSPITAL_COMMUNITY): Payer: Medicare HMO | Admitting: Occupational Therapy

## 2016-10-31 DIAGNOSIS — M6281 Muscle weakness (generalized): Secondary | ICD-10-CM

## 2016-10-31 DIAGNOSIS — R278 Other lack of coordination: Secondary | ICD-10-CM

## 2016-10-31 DIAGNOSIS — R29898 Other symptoms and signs involving the musculoskeletal system: Secondary | ICD-10-CM

## 2016-10-31 DIAGNOSIS — R262 Difficulty in walking, not elsewhere classified: Secondary | ICD-10-CM

## 2016-10-31 DIAGNOSIS — R2681 Unsteadiness on feet: Secondary | ICD-10-CM | POA: Diagnosis not present

## 2016-10-31 NOTE — Therapy (Signed)
[facility] [place], [place], [address] Phone: [phone]   Fax:  [phone]  Occupational Therapy Treatment  Patient Details  Name: April Page MRN: [id number] Date of Birth: 04/18/1945 Referring Provider: Dr. Redmond School  Encounter Date: 10/31/2016      OT End of Session - 10/31/16 1603    Visit Number 8   Number of Visits 16   Date for OT Re-Evaluation 11/26/16   Authorization Type [employer]   Authorization Time Period Before 10th visit   Authorization - Visit Number 8   Authorization - Number of Visits 10   OT Start Time 1350   OT Stop Time 1430   OT Time Calculation (min) 40 min   Activity Tolerance Patient tolerated treatment well   Behavior During Therapy [facility] for tasks assessed/performed      Past Medical History:  Diagnosis Date  . Arthritis    stiff knees  . Blood dyscrasia   . Cancer ([place])   . CVA (cerebral vascular accident) ([place])   . GERD (gastroesophageal reflux disease)   . H/O: CML (chronic myeloid leukemia)   . Hyperlipidemia   . Hyperlipidemia   . Hypertension   . Obesity   . Pessary maintenance 07/01/2013  . Stroke ([place])    x 3    Past Surgical History:  Procedure Laterality Date  . ABDOMINAL HYSTERECTOMY    . CHOLECYSTECTOMY    . COLONOSCOPY  06/09/2002   ONG:EXBMWUXL hemorrhoids; otherwise normal rectum, colon   . COLONOSCOPY N/A 08/25/2013   KGM:WNUUVOZ diverticulosis. Single colonic polyp-removed  s/p segmental biopsy and stool sample. random colon bx negative. +benign leiomyoma.  . cystocele/rectocele repair  2009  . ESOPHAGOGASTRODUODENOSCOPY  06/09/2002   DGU:YQIHKV upper gastrointestinal tract s/p  54-French Maloney dilator  . FEMUR IM NAIL Right 04/12/2016   Procedure: INTRAMEDULLARY (IM) NAIL FEMORAL;  Surgeon: Nicholes Stairs, MD;  Location: [place];  Service: Orthopedics;  Laterality: Right;  . HERNIA REPAIR    . LOOP RECORDER INSERTION N/A 08/23/2016    Procedure: Loop Recorder Insertion;  Surgeon: Constance Haw, MD;  Location: [place] CV LAB;  Service: Cardiovascular;  Laterality: N/A;  . OOPHORECTOMY    . TEE WITHOUT CARDIOVERSION N/A 08/23/2016   Procedure: TRANSESOPHAGEAL ECHOCARDIOGRAM (TEE);  Surgeon: Fay Records, MD;  Location: [place];  Service: Cardiovascular;  Laterality: N/A;    There were no vitals filed for this visit.      Subjective Assessment - 10/31/16 1354    Subjective  S: This is not too easy.    Currently in Pain? No/denies            [facility] OT Assessment - 10/31/16 1354      Assessment   Diagnosis s/p CVA     Precautions   Precautions Fall                  OT Treatments/Exercises (OP) - 10/31/16 1354      Exercises   Exercises Shoulder;Hand     Hand Exercises   Hand Gripper with Small Beads right hand: all beads 25# left hand: 5/13 beads 25#, 8/13 beads 22#   Small Pegboard Pt completed small pegboard, placing pegs along one side of board with left hand. Pt held 4 pegs in hand while placing one at a time. Verbal cuing not to use right hand to assist. Pt then placed pegs with right hand along one side of board, holding 4 pegs  at one time while placing one peg at a time. Pt required consistent cuing to only use right hand.      Functional Reaching Activities   Mid Level Pt completed pinch tree this session, focusing on shoulder protraction and flexion as well as pinch strengthening. Pt placed 22 pins along vertical pole using right arm/hand with lateral pinch. Pt then removed pins with left arm/hand and 3 point pinch.                   OT Short Term Goals - 10/29/16 1547      OT SHORT TERM GOAL #1   Title Pt will be provided with and educated on HEP for improved independence during ADL completion.    Time 4   Period Weeks   Status On-going     OT SHORT TERM GOAL #2   Title Pt will improve independence in ADL completion by decreasing level of assistance  required during bathing tasks from mod/max to minimal.    Time 4   Period Weeks   Status On-going     OT SHORT TERM GOAL #3   Title Pt will improve BUE grip strength by 6# and pinch strength by 2# to improve ability to grasp and hold grooming items.    Time 4   Period Weeks   Status Partially Met     OT SHORT TERM GOAL #4   Title Pt will improve right fine motor coordination required for ADL completion by completing 9 hole peg test in less than 1'.    Time 4   Period Weeks           OT Long Term Goals - 10/25/16 1435      OT LONG TERM GOAL #1   Title Pt will return to highest level of functioning and independence in B/ADL tasks, completing tasks at supervision level or better.    Time 8   Period Weeks   Status On-going     OT LONG TERM GOAL #2   Title Pt will improve BUE strength to 4+/5 to improve ability to complete ADL tasks with greater independence.    Time 8   Period Weeks   Status On-going     OT LONG TERM GOAL #3   Title Pt will improve BUE grip strength by 12# and pinch strength by 4# to improve ability to hold and utilize items for light meal preparation.    Time 8   Period Weeks   Status Partially Met     OT LONG TERM GOAL #4   Title Pt will improve right fine motor coordination by completing 9 hole peg test in 45" or less.    Time 8   Period Weeks   Status On-going               Plan - 10/31/16 1603    Clinical Impression Statement A: Continued with grip strengthening this session, pt requiring significantly increased time for hand gripper completion, as well as verbal and tactile cuing for correct technique. Added functional reaching task and fine motor task. Pt requiring verbal and tactile facilitation for correct technique for fine motor task as well.    Plan P: Fine motor task with right hand-focus on correct technqiue and using right hand only for task completion; update G-Code   OT Home Exercise Plan 7/20: red theraputty for grip and pinch  strengthening, fine motor coordination tasks   Consulted and Agree with Plan of Care Patient  Patient will benefit from skilled therapeutic intervention in order to improve the following deficits and impairments:  Decreased activity tolerance, Decreased strength, Impaired UE functional use, Decreased coordination  Visit Diagnosis: Other symptoms and signs involving the musculoskeletal system  Other lack of coordination  Muscle weakness (generalized)    Problem List Patient Active Problem List   Diagnosis Date Noted  . Controlled diabetes mellitus type 2 with complications ([place]) 02/89/0228  . Physical deconditioning   . Acute ischemic stroke ([place]) 08/20/2016  . Pressure injury of skin 08/17/2016  . Acute CVA (cerebrovascular accident) ([place]) 08/17/2016  . UTI (urinary tract infection) 08/16/2016  . Constipation 07/10/2016  . Dysphagia as late effect of cerebrovascular accident (CVA) 07/10/2016  . Loss of weight 07/10/2016  . Impaired glucose tolerance 06/05/2016  . Slurred speech   . Hypokalemia 04/13/2016  . Hyperglycemia 04/13/2016  . Essential hypertension 04/13/2016  . Hyperlipidemia 04/13/2016  . S/P right hip fracture 04/12/2016  . Hip fracture ([place]) 04/12/2016  . Fall   . Itching in the vaginal area 10/05/2014  . Chronic myelogenous leukemia (CML), BCR-ABL1-positive ([place])   . GERD (gastroesophageal reflux disease) 12/06/2013  . Leukocytosis 08/06/2013  . Atypical chest pain 08/06/2013  . Chronic diarrhea 08/05/2013  . Pessary maintenance 07/01/2013   Guadelupe Sabin, OTR/L  [phone] 10/31/2016, 4:06 PM  [facility] [address] [place], [place], [address] Phone: [phone]   Fax:  [phone]  Name: April Page MRN: [id number] Date of Birth: 12-24-1945

## 2016-10-31 NOTE — Therapy (Signed)
[facility] [place], [place], [address] Phone: [phone]   Fax:  [phone]  Physical Therapy Treatment  Patient Details  Name: April Page MRN: [id number] Date of Birth: 19-Dec-1945 Referring Provider: Dr. Gerarda Fraction   Encounter Date: 10/31/2016      PT End of Session - 10/31/16 1344    Visit Number 10   Number of Visits 17   Date for PT Re-Evaluation 11/24/16   Authorization Type [employer] H[employer]   Authorization Time Period 09/26/16 to 11/27/16   Authorization - Visit Number 10   Authorization - Number of Visits 19   PT Start Time 3159  patient late    PT Stop Time 1342   PT Time Calculation (min) 24 min   Activity Tolerance Patient tolerated treatment well   Behavior During Therapy [facility] for tasks assessed/performed      Past Medical History:  Diagnosis Date  . Arthritis    stiff knees  . Blood dyscrasia   . Cancer ([place])   . CVA (cerebral vascular accident) ([place])   . GERD (gastroesophageal reflux disease)   . H/O: CML (chronic myeloid leukemia)   . Hyperlipidemia   . Hyperlipidemia   . Hypertension   . Obesity   . Pessary maintenance 07/01/2013  . Stroke ([place])    x 3    Past Surgical History:  Procedure Laterality Date  . ABDOMINAL HYSTERECTOMY    . CHOLECYSTECTOMY    . COLONOSCOPY  06/09/2002   YVO:PFYTWKMQ hemorrhoids; otherwise normal rectum, colon   . COLONOSCOPY N/A 08/25/2013   KMM:NOTRRNH diverticulosis. Single colonic polyp-removed  s/p segmental biopsy and stool sample. random colon bx negative. +benign leiomyoma.  . cystocele/rectocele repair  2009  . ESOPHAGOGASTRODUODENOSCOPY  06/09/2002   AFB:XUXYBF upper gastrointestinal tract s/p  54-French Maloney dilator  . FEMUR IM NAIL Right 04/12/2016   Procedure: INTRAMEDULLARY (IM) NAIL FEMORAL;  Surgeon: Nicholes Stairs, MD;  Location: [place];  Service: Orthopedics;  Laterality: Right;  . HERNIA REPAIR    . LOOP RECORDER INSERTION N/A  08/23/2016   Procedure: Loop Recorder Insertion;  Surgeon: Constance Haw, MD;  Location: [place] CV LAB;  Service: Cardiovascular;  Laterality: N/A;  . OOPHORECTOMY    . TEE WITHOUT CARDIOVERSION N/A 08/23/2016   Procedure: TRANSESOPHAGEAL ECHOCARDIOGRAM (TEE);  Surgeon: Fay Records, MD;  Location: [place];  Service: Cardiovascular;  Laterality: N/A;    There were no vitals filed for this visit.      Subjective Assessment - 10/31/16 1328    Subjective Patient arrives late today, otehrewise doing well with no major changes    Pertinent History HTN, CVA March 2018, DM, R hip fractures, leukemia, R femur IM nail s/p fall 04/12/16, multiple CVAs this year    Patient Stated Goals to regain independence    Currently in Pain? No/denies                         [facility] Adult PT Treatment/Exercise - 10/31/16 0001      Knee/Hip Exercises: Aerobic   Nustep Nustep L0 focus on maintaining pace 45-63SPM x6 minutes; direct supervisoin/mod cues provided      Knee/Hip Exercises: Standing   Gait Training gait training with and without cane pull with cues for speed, posture, and reciprocal rather than step to pattern                 PT Education - 10/31/16 1344  Education provided Yes   Education Details review and enforcement of all gait mechanics practiced today    Person(s) Educated Patient   Methods Explanation   Comprehension Verbalized understanding          PT Short Term Goals - 10/25/16 1336      PT SHORT TERM GOAL #1   Title Patient to be able to complete all functional mobility, including bed mobility, with Mod(I) in order to improve functional independence    Time 4   Period Weeks   Status Achieved     PT SHORT TERM GOAL #2   Title Patient to be able to ambulate at gait speed of at least 0.61ms with walker in order to improve access to home and community    Baseline 0.142m at eval; 0.12579mat reassessment    Time 4   Period Weeks    Status On-going     PT SHORT TERM GOAL #3   Title Patient to be able to complete 5 time sit to stand test in 30 seconds with UEs in order to show improved functional mobility and strength    Baseline unable to perform in <30second safely with UE and elevated surface.    Time 4   Period Weeks   Status On-going     PT SHORT TERM GOAL #4   Title Patient to be able to correctly and consistently perform HEP at least 5 days per week, to be updated as appropriate    Time 1   Period Weeks   Status Achieved           PT Long Term Goals - 10/25/16 1339      PT LONG TERM GOAL #1   Title Patient to show functional strength as being 4+/5 in order to improve mobilty and balance    Baseline many groups are 4+-5/5 but isolated deficits on Right hamstrings, abductors, and external rotators; gross hip extension weakness bilat   Time 8   Period Weeks   Status Partially Met     PT LONG TERM GOAL #2   Title Patient to be able to maintain tandem stance for at least 20 seconds bilaterally on solid surface and TUG test in 20 seconds with walker in order to show imrpoved balance and reduced fall risk    Baseline TUG (8/17) 62s; tandem attempt limited/unsafe at this time.    Time 8   Period Weeks   Status On-going     PT LONG TERM GOAL #3   Title Paitent to be able to ascend and descend full flight of stairs with U railing and min guard, step to pattern, no unsteadiness or fatigue, in order to improve home and community access    Baseline 5/31- 32   Time 8   Period Weeks   Status Deferred  patient has no stairs in home nor at egress     PT LONG TERM GOAL #4   Title Patient to be able to ambulate at at least 0.79m/37mith walker in order to improve access to home and community, also to show general reduced fall risk    Baseline 0.88m/52m 8/17   Time 8   Period Weeks   Status On-going               Plan - 10/31/16 1345    Clinical Impression Statement Patient arrives late for today's  session. Began on Nustep with direct supervision by PT with cues for SPM speed and maintenance of 45-50SPM on  machine. Followed this immediately with gait assisted by cane pull for increased speed with ambulation for improved carryover. Patient limited by fatigue today, required Mod cues while on Nustep today for speed and pacing. Trialed raising walker to reduce strong dependence on UEs during gait as well with moderate cues required for pacing, reciprocal rather than step to pattern, and reduced UE use.    Rehab Potential Good   Clinical Impairments Affecting Rehab Potential (+) supportive family; (-) hx of fall with serious injury, multiple recent CVAs   PT Frequency 2x / week   PT Duration 8 weeks   PT Treatment/Interventions ADLs/Self Care Home Management;Biofeedback;Cryotherapy;DME Instruction;Gait training;Stair training;Functional mobility training;Therapeutic activities;Therapeutic exercise;Balance training;Neuromuscular re-education;Patient/family education;Manual techniques;Energy conservation;Taping   PT Next Visit Plan NUSTEP LEVEL 0 at at least 45-60SPM. Don't worry about resistance, focus on speed with immediate gait training following for carryover. Continue to reduce UE pressure on AD.    PT Home Exercise Plan Eval: bridges, supine hip ABD with red TB, LAQs with red TB    Consulted and Agree with Plan of Care Patient      Patient will benefit from skilled therapeutic intervention in order to improve the following deficits and impairments:  Abnormal gait, Decreased coordination, Decreased mobility, Decreased activity tolerance, Decreased strength, Decreased balance, Decreased safety awareness, Difficulty walking  Visit Diagnosis: Muscle weakness (generalized)  Other symptoms and signs involving the musculoskeletal system  Difficulty in walking, not elsewhere classified  Unsteadiness on feet  Other lack of coordination     Problem List Patient Active Problem List    Diagnosis Date Noted  . Controlled diabetes mellitus type 2 with complications ([place]) 01/11/1116  . Physical deconditioning   . Acute ischemic stroke ([place]) 08/20/2016  . Pressure injury of skin 08/17/2016  . Acute CVA (cerebrovascular accident) ([place]) 08/17/2016  . UTI (urinary tract infection) 08/16/2016  . Constipation 07/10/2016  . Dysphagia as late effect of cerebrovascular accident (CVA) 07/10/2016  . Loss of weight 07/10/2016  . Impaired glucose tolerance 06/05/2016  . Slurred speech   . Hypokalemia 04/13/2016  . Hyperglycemia 04/13/2016  . Essential hypertension 04/13/2016  . Hyperlipidemia 04/13/2016  . S/P right hip fracture 04/12/2016  . Hip fracture ([place]) 04/12/2016  . Fall   . Itching in the vaginal area 10/05/2014  . Chronic myelogenous leukemia (CML), BCR-ABL1-positive ([place])   . GERD (gastroesophageal reflux disease) 12/06/2013  . Leukocytosis 08/06/2013  . Atypical chest pain 08/06/2013  . Chronic diarrhea 08/05/2013  . Pessary maintenance 07/01/2013    Deniece Ree PT, DPT [facility] [address] [place], [place], [address] Phone: [phone]   Fax:  [phone]  Name: ISSABELA LESKO MRN: [id number] Date of Birth: Jun 09, 1945

## 2016-11-05 ENCOUNTER — Ambulatory Visit (HOSPITAL_COMMUNITY): Payer: Medicare HMO | Admitting: Occupational Therapy

## 2016-11-05 ENCOUNTER — Ambulatory Visit (HOSPITAL_COMMUNITY): Payer: Medicare HMO | Admitting: Physical Therapy

## 2016-11-05 ENCOUNTER — Encounter (HOSPITAL_COMMUNITY): Payer: Self-pay | Admitting: Occupational Therapy

## 2016-11-05 DIAGNOSIS — R29898 Other symptoms and signs involving the musculoskeletal system: Secondary | ICD-10-CM | POA: Diagnosis not present

## 2016-11-05 DIAGNOSIS — R278 Other lack of coordination: Secondary | ICD-10-CM

## 2016-11-05 DIAGNOSIS — R2681 Unsteadiness on feet: Secondary | ICD-10-CM | POA: Diagnosis not present

## 2016-11-05 DIAGNOSIS — M6281 Muscle weakness (generalized): Secondary | ICD-10-CM | POA: Diagnosis not present

## 2016-11-05 DIAGNOSIS — R262 Difficulty in walking, not elsewhere classified: Secondary | ICD-10-CM | POA: Diagnosis not present

## 2016-11-05 NOTE — Therapy (Signed)
[place], [place], [address] Phone: [phone]   Fax:  [phone]  Physical Therapy Treatment  Patient Details  Name: April Page MRN: [id number] Date of Birth: 05/25/1945 Referring Provider: Dr. Gerarda Fraction   Encounter Date: 11/05/2016      PT End of Session - 11/05/16 1343    Visit Number 11   Number of Visits 17   Date for PT Re-Evaluation 11/24/16   Authorization Type [employer] H[employer]   Authorization Time Period 09/26/16 to 11/27/16   Authorization - Visit Number 11   Authorization - Number of Visits 19   PT Start Time 1301   PT Stop Time 1340   PT Time Calculation (min) 39 min   Equipment Utilized During Treatment Gait belt   Activity Tolerance Patient tolerated treatment well   Behavior During Therapy [facility] for tasks assessed/performed      Past Medical History:  Diagnosis Date  . Arthritis    stiff knees  . Blood dyscrasia   . Cancer ([place])   . CVA (cerebral vascular accident) ([place])   . GERD (gastroesophageal reflux disease)   . H/O: CML (chronic myeloid leukemia)   . Hyperlipidemia   . Hyperlipidemia   . Hypertension   . Obesity   . Pessary maintenance 07/01/2013  . Stroke ([place])    x 3    Past Surgical History:  Procedure Laterality Date  . ABDOMINAL HYSTERECTOMY    . CHOLECYSTECTOMY    . COLONOSCOPY  06/09/2002   FOY:DXAJOINO hemorrhoids; otherwise normal rectum, colon   . COLONOSCOPY N/A 08/25/2013   MVE:HMCNOBS diverticulosis. Single colonic polyp-removed  s/p segmental biopsy and stool sample. random colon bx negative. +benign leiomyoma.  . cystocele/rectocele repair  2009  . ESOPHAGOGASTRODUODENOSCOPY  06/09/2002   JGG:EZMOQH upper gastrointestinal tract s/p  54-French Maloney dilator  . FEMUR IM NAIL Right 04/12/2016   Procedure: INTRAMEDULLARY (IM) NAIL FEMORAL;  Surgeon: Nicholes Stairs, MD;  Location: [place];  Service: Orthopedics;  Laterality: Right;  . HERNIA REPAIR    .  LOOP RECORDER INSERTION N/A 08/23/2016   Procedure: Loop Recorder Insertion;  Surgeon: Constance Haw, MD;  Location: [place] [facility];  Service: Cardiovascular;  Laterality: N/A;  . OOPHORECTOMY    . TEE WITHOUT CARDIOVERSION N/A 08/23/2016   Procedure: TRANSESOPHAGEAL ECHOCARDIOGRAM (TEE);  Surgeon: Fay Records, MD;  Location: [place];  Service: Cardiovascular;  Laterality: N/A;    There were no vitals filed for this visit.      Subjective Assessment - 11/05/16 1310    Subjective patient arrives in good spirits today, no major changes and her raised walker feels good    Pertinent History HTN, CVA March 2018, DM, R hip fractures, leukemia, R femur IM nail s/p fall 04/12/16, multiple CVAs this year    Patient Stated Goals to regain independence    Currently in Pain? No/denies                         [facility] Adult PT Treatment/Exercise - 11/05/16 0001      Knee/Hip Exercises: Standing   Gait Training x6 rounds of 56f at 0.630m walker with cane pull    Other Standing Knee Exercises sit to stand with static satnce holds, min assist and mod cues to come and remain upright   no UEs for LE strength focus    Other Standing Knee Exercises catch on solid surface min guard mild  posterior lean on chair no AD                 PT Education - 11/05/16 1343    Education provided No          PT Short Term Goals - 10/25/16 1336      PT SHORT TERM GOAL #1   Title Patient to be able to complete all functional mobility, including bed mobility, with Mod(I) in order to improve functional independence    Time 4   Period Weeks   Status Achieved     PT SHORT TERM GOAL #2   Title Patient to be able to ambulate at gait speed of at least 0.44ms with walker in order to improve access to home and community    Baseline 0.1455m at eval; 0.1243mat reassessment    Time 4   Period Weeks   Status On-going     PT SHORT TERM GOAL #3   Title Patient to be able to  complete 5 time sit to stand test in 30 seconds with UEs in order to show improved functional mobility and strength    Baseline unable to perform in <30second safely with UE and elevated surface.    Time 4   Period Weeks   Status On-going     PT SHORT TERM GOAL #4   Title Patient to be able to correctly and consistently perform HEP at least 5 days per week, to be updated as appropriate    Time 1   Period Weeks   Status Achieved           PT Long Term Goals - 10/25/16 1339      PT LONG TERM GOAL #1   Title Patient to show functional strength as being 4+/5 in order to improve mobilty and balance    Baseline many groups are 4+-5/5 but isolated deficits on Right hamstrings, abductors, and external rotators; gross hip extension weakness bilat   Time 8   Period Weeks   Status Partially Met     PT LONG TERM GOAL #2   Title Patient to be able to maintain tandem stance for at least 20 seconds bilaterally on solid surface and TUG test in 20 seconds with walker in order to show imrpoved balance and reduced fall risk    Baseline TUG (8/17) 62s; tandem attempt limited/unsafe at this time.    Time 8   Period Weeks   Status On-going     PT LONG TERM GOAL #3   Title Paitent to be able to ascend and descend full flight of stairs with U railing and min guard, step to pattern, no unsteadiness or fatigue, in order to improve home and community access    Baseline 5/31- 32   Time 8   Period Weeks   Status Deferred  patient has no stairs in home nor at egress     PT LONG TERM GOAL #4   Title Patient to be able to ambulate at at least 0.55m/29mith walker in order to improve access to home and community, also to show general reduced fall risk    Baseline 0.65m/47m 8/17   Time 8   Period Weeks   Status On-going               Plan - 11/05/16 1344    Clinical Impression Statement Performed Nustep today without UEs to facilitate and encourage LE use and strength due to patient's tendency  to over-depend on UEs during  gait; noted increased difficulty with this likely due to ongoing chronic LE weakness. Followed this immediately with gait training for speed to attempt to maximize carryover during ambulation. Otherwise continued attempts for LE strengthening and balance with minimal UE assist/involvement this session to continue to promote improvements in LE strength/endurance/reduced fall risk. LEs appear to remain very weak due to long term UE compensations with use of walker.    Rehab Potential Good   Clinical Impairments Affecting Rehab Potential (+) supportive family; (-) hx of fall with serious injury, multiple recent CVAs   PT Frequency 2x / week   PT Duration 8 weeks   PT Treatment/Interventions ADLs/Self Care Home Management;Biofeedback;Cryotherapy;DME Instruction;Gait training;Stair training;Functional mobility training;Therapeutic activities;Therapeutic exercise;Balance training;Neuromuscular re-education;Patient/family education;Manual techniques;Energy conservation;Taping   PT Next Visit Plan NUSTEP LEVEL 0 at at least 45-60SPM for UEs/LEs, 35-40SPM with ELs only.. Don't worry about resistance, focus on speed with immediate gait training following for carryover. Continue to reduce UE pressure on AD. Continue sit to stands with no UE assist.    PT Home Exercise Plan Eval: bridges, supine hip ABD with red TB, LAQs with red TB    Consulted and Agree with Plan of Care Patient      Patient will benefit from skilled therapeutic intervention in order to improve the following deficits and impairments:  Abnormal gait, Decreased coordination, Decreased mobility, Decreased activity tolerance, Decreased strength, Decreased balance, Decreased safety awareness, Difficulty walking  Visit Diagnosis: Muscle weakness (generalized)  Difficulty in walking, not elsewhere classified  Unsteadiness on feet     Problem List Patient Active Problem List   Diagnosis Date Noted  . Controlled  diabetes mellitus type 2 with complications ([place]) 43/32/9518  . Physical deconditioning   . Acute ischemic stroke ([place]) 08/20/2016  . Pressure injury of skin 08/17/2016  . Acute CVA (cerebrovascular accident) ([place]) 08/17/2016  . UTI (urinary tract infection) 08/16/2016  . Constipation 07/10/2016  . Dysphagia as late effect of cerebrovascular accident (CVA) 07/10/2016  . Loss of weight 07/10/2016  . Impaired glucose tolerance 06/05/2016  . Slurred speech   . Hypokalemia 04/13/2016  . Hyperglycemia 04/13/2016  . Essential hypertension 04/13/2016  . Hyperlipidemia 04/13/2016  . S/P right hip fracture 04/12/2016  . Hip fracture ([place]) 04/12/2016  . Fall   . Itching in the vaginal area 10/05/2014  . Chronic myelogenous leukemia (CML), BCR-ABL1-positive ([place])   . GERD (gastroesophageal reflux disease) 12/06/2013  . Leukocytosis 08/06/2013  . Atypical chest pain 08/06/2013  . Chronic diarrhea 08/05/2013  . Pessary maintenance 07/01/2013    Deniece Ree PT, DPT [facility] [address] [place], [place], [address] Phone: [phone]   Fax:  [phone]  Name: April Page MRN: [id number] Date of Birth: 1946-03-11

## 2016-11-05 NOTE — Therapy (Signed)
[place], [place], [address] Phone: [phone]   Fax:  [phone]  Occupational Therapy Treatment  Patient Details  Name: April Page MRN: [id number] Date of Birth: 1945-11-08 Referring Provider: Dr. Redmond School  Encounter Date: 11/05/2016      OT End of Session - 11/05/16 1621    Visit Number 9   Number of Visits 16   Date for OT Re-Evaluation 11/26/16   Authorization Type [employer]   Authorization Time Period Before 19th visit   Authorization - Visit Number 9   Authorization - Number of Visits 19   OT Start Time 2706   OT Stop Time 1430   OT Time Calculation (min) 41 min   Activity Tolerance Patient tolerated treatment well   Behavior During Therapy [facility] for tasks assessed/performed      Past Medical History:  Diagnosis Date  . Arthritis    stiff knees  . Blood dyscrasia   . Cancer ([place])   . CVA (cerebral vascular accident) ([place])   . GERD (gastroesophageal reflux disease)   . H/O: CML (chronic myeloid leukemia)   . Hyperlipidemia   . Hyperlipidemia   . Hypertension   . Obesity   . Pessary maintenance 07/01/2013  . Stroke ([place])    x 3    Past Surgical History:  Procedure Laterality Date  . ABDOMINAL HYSTERECTOMY    . CHOLECYSTECTOMY    . COLONOSCOPY  06/09/2002   CBJ:SEGBTDVV hemorrhoids; otherwise normal rectum, colon   . COLONOSCOPY N/A 08/25/2013   OHY:WVPXTGG diverticulosis. Single colonic polyp-removed  s/p segmental biopsy and stool sample. random colon bx negative. +benign leiomyoma.  . cystocele/rectocele repair  2009  . ESOPHAGOGASTRODUODENOSCOPY  06/09/2002   YIR:SWNIOE upper gastrointestinal tract s/p  54-French Maloney dilator  . FEMUR IM NAIL Right 04/12/2016   Procedure: INTRAMEDULLARY (IM) NAIL FEMORAL;  Surgeon: Nicholes Stairs, MD;  Location: [place];  Service: Orthopedics;  Laterality: Right;  . HERNIA REPAIR    . LOOP RECORDER INSERTION N/A 08/23/2016   Procedure: Loop Recorder Insertion;  Surgeon: Constance Haw, MD;  Location: [place] [facility];  Service: Cardiovascular;  Laterality: N/A;  . OOPHORECTOMY    . TEE WITHOUT CARDIOVERSION N/A 08/23/2016   Procedure: TRANSESOPHAGEAL ECHOCARDIOGRAM (TEE);  Surgeon: Fay Records, MD;  Location: [place];  Service: Cardiovascular;  Laterality: N/A;    There were no vitals filed for this visit.      Subjective Assessment - 11/05/16 1356    Subjective  S: So far so good.    Currently in Pain? No/denies            [facility] OT Assessment - 11/05/16 1356      Assessment   Diagnosis s/p CVA     Precautions   Precautions Fall                  OT Treatments/Exercises (OP) - 11/05/16 1357      Exercises   Exercises Hand     Hand Exercises   Small Pegboard Pt completed small pegboard task this session, focusing on fine motor coordination of right hand. Verbal cuing required to not use left hand. Increased time required to complete pattern, mod difficulty with grasping and placing pegs.      Fine Motor Coordination   Fine Motor Coordination Picking up coins;Manipulating coins   Picking up coins Pt attempted to pick up coins from tabletop, unable to pick up directly from  table, instead sliding to edge of table and picking up before dropping into slitted container.    Manipulating coins Pt held 5 and 8 coins in right hand and worked on in-hand manipulation and palm to fingertip translation prior to placing in slotted container. Pt attempts to drop on top of container and then pick up with thumb and index finger. OT provided cuing on proper technique; mod difficulty with completion. Pt also completed one trial with left hand, no difficulty                  OT Short Term Goals - 10/29/16 1547      OT SHORT TERM GOAL #1   Title Pt will be provided with and educated on HEP for improved independence during ADL completion.    Time 4   Period Weeks   Status  On-going     OT SHORT TERM GOAL #2   Title Pt will improve independence in ADL completion by decreasing level of assistance required during bathing tasks from mod/max to minimal.    Time 4   Period Weeks   Status On-going     OT SHORT TERM GOAL #3   Title Pt will improve BUE grip strength by 6# and pinch strength by 2# to improve ability to grasp and hold grooming items.    Time 4   Period Weeks   Status Partially Met     OT SHORT TERM GOAL #4   Title Pt will improve right fine motor coordination required for ADL completion by completing 9 hole peg test in less than 1'.    Time 4   Period Weeks           OT Long Term Goals - 10/25/16 1435      OT LONG TERM GOAL #1   Title Pt will return to highest level of functioning and independence in B/ADL tasks, completing tasks at supervision level or better.    Time 8   Period Weeks   Status On-going     OT LONG TERM GOAL #2   Title Pt will improve BUE strength to 4+/5 to improve ability to complete ADL tasks with greater independence.    Time 8   Period Weeks   Status On-going     OT LONG TERM GOAL #3   Title Pt will improve BUE grip strength by 12# and pinch strength by 4# to improve ability to hold and utilize items for light meal preparation.    Time 8   Period Weeks   Status Partially Met     OT LONG TERM GOAL #4   Title Pt will improve right fine motor coordination by completing 9 hole peg test in 45" or less.    Time 8   Period Weeks   Status On-going               Plan - 11/05/16 1419    Clinical Impression Statement A: Session focusing on fine motor coordination of right hand. Added coin task working on picking up coins and in-hand manipulation, pt required cuing for correct task completion. Continued with small pegboard activity, OT notes improvement in use of right hand to place pegs. Only initial verbal cuing required for not using left hand to assist.    Plan P: Resume BUE strengthening and grip/pinch  strengthening tasks.    OT Home Exercise Plan 7/20: red theraputty for grip and pinch strengthening, fine motor coordination tasks      Patient will  benefit from skilled therapeutic intervention in order to improve the following deficits and impairments:  Decreased activity tolerance, Decreased strength, Impaired UE functional use, Decreased coordination  Visit Diagnosis: Other symptoms and signs involving the musculoskeletal system  Other lack of coordination      G-Codes - 2016-11-17 1622    Functional Assessment Tool Used (Outpatient only) clinical judgement   Functional Limitation Self care   Self Care Current Status (X2811) At least 60 percent but less than 80 percent impaired, limited or restricted   Self Care Goal Status (W8677) At least 40 percent but less than 60 percent impaired, limited or restricted      Problem List Patient Active Problem List   Diagnosis Date Noted  . Controlled diabetes mellitus type 2 with complications ([place]) 37/36/6815  . Physical deconditioning   . Acute ischemic stroke ([place]) 08/20/2016  . Pressure injury of skin 08/17/2016  . Acute CVA (cerebrovascular accident) ([place]) 08/17/2016  . UTI (urinary tract infection) 08/16/2016  . Constipation 07/10/2016  . Dysphagia as late effect of cerebrovascular accident (CVA) 07/10/2016  . Loss of weight 07/10/2016  . Impaired glucose tolerance 06/05/2016  . Slurred speech   . Hypokalemia 04/13/2016  . Hyperglycemia 04/13/2016  . Essential hypertension 04/13/2016  . Hyperlipidemia 04/13/2016  . S/P right hip fracture 04/12/2016  . Hip fracture ([place]) 04/12/2016  . Fall   . Itching in the vaginal area 10/05/2014  . Chronic myelogenous leukemia (CML), BCR-ABL1-positive ([place])   . GERD (gastroesophageal reflux disease) 12/06/2013  . Leukocytosis 08/06/2013  . Atypical chest pain 08/06/2013  . Chronic diarrhea 08/05/2013  . Pessary maintenance 07/01/2013   Guadelupe Sabin, OTR/L  [phone] 11-17-16,  4:22 PM  [facility] [address] [place], [place], [address] Phone: [phone]   Fax:  [phone]  Name: April Page MRN: [id number] Date of Birth: 10-24-1945

## 2016-11-07 ENCOUNTER — Encounter (HOSPITAL_COMMUNITY): Payer: Self-pay | Admitting: Occupational Therapy

## 2016-11-07 ENCOUNTER — Ambulatory Visit (HOSPITAL_COMMUNITY): Payer: Medicare HMO | Admitting: Occupational Therapy

## 2016-11-07 ENCOUNTER — Ambulatory Visit (HOSPITAL_COMMUNITY): Payer: Medicare HMO | Admitting: Physical Therapy

## 2016-11-07 DIAGNOSIS — R29898 Other symptoms and signs involving the musculoskeletal system: Secondary | ICD-10-CM | POA: Diagnosis not present

## 2016-11-07 DIAGNOSIS — R262 Difficulty in walking, not elsewhere classified: Secondary | ICD-10-CM

## 2016-11-07 DIAGNOSIS — M6281 Muscle weakness (generalized): Secondary | ICD-10-CM

## 2016-11-07 DIAGNOSIS — R278 Other lack of coordination: Secondary | ICD-10-CM | POA: Diagnosis not present

## 2016-11-07 DIAGNOSIS — R2681 Unsteadiness on feet: Secondary | ICD-10-CM

## 2016-11-07 NOTE — Therapy (Signed)
[place], [place], [address] Phone: [phone]   Fax:  [phone]  Physical Therapy Treatment (Discharge)  Patient Details  Name: April Page MRN: [id number] Date of Birth: 19-Apr-1945 Referring Provider: Dr. Gerarda Fraction   Encounter Date: 11/07/2016      PT End of Session - 11/07/16 1352    Visit Number 12   Number of Visits 12   Authorization Type [employer] H[employer]   Authorization Time Period 09/26/16 to 11/27/16   Authorization - Visit Number 12   Authorization - Number of Visits 19   PT Start Time 1301   PT Stop Time 1343   PT Time Calculation (min) 42 min   Equipment Utilized During Treatment Gait belt   Activity Tolerance Patient tolerated treatment well   Behavior During Therapy [facility] for tasks assessed/performed      Past Medical History:  Diagnosis Date  . Arthritis    stiff knees  . Blood dyscrasia   . Cancer ([place])   . CVA (cerebral vascular accident) ([place])   . GERD (gastroesophageal reflux disease)   . H/O: CML (chronic myeloid leukemia)   . Hyperlipidemia   . Hyperlipidemia   . Hypertension   . Obesity   . Pessary maintenance 07/01/2013  . Stroke ([place])    x 3    Past Surgical History:  Procedure Laterality Date  . ABDOMINAL HYSTERECTOMY    . CHOLECYSTECTOMY    . COLONOSCOPY  06/09/2002   VFI:EPPIRJJO hemorrhoids; otherwise normal rectum, colon   . COLONOSCOPY N/A 08/25/2013   ACZ:YSAYTKZ diverticulosis. Single colonic polyp-removed  s/p segmental biopsy and stool sample. random colon bx negative. +benign leiomyoma.  . cystocele/rectocele repair  2009  . ESOPHAGOGASTRODUODENOSCOPY  06/09/2002   SWF:UXNATF upper gastrointestinal tract s/p  54-French Maloney dilator  . FEMUR IM NAIL Right 04/12/2016   Procedure: INTRAMEDULLARY (IM) NAIL FEMORAL;  Surgeon: Nicholes Stairs, MD;  Location: [place];  Service: Orthopedics;  Laterality: Right;  . HERNIA REPAIR    . LOOP RECORDER INSERTION  N/A 08/23/2016   Procedure: Loop Recorder Insertion;  Surgeon: Constance Haw, MD;  Location: [place] [facility];  Service: Cardiovascular;  Laterality: N/A;  . OOPHORECTOMY    . TEE WITHOUT CARDIOVERSION N/A 08/23/2016   Procedure: TRANSESOPHAGEAL ECHOCARDIOGRAM (TEE);  Surgeon: Fay Records, MD;  Location: [facility];  Service: Cardiovascular;  Laterality: N/A;    There were no vitals filed for this visit.      Subjective Assessment - 11/07/16 1313    Subjective Patient arrives doing well, no major chagnes    Pertinent History HTN, CVA March 2018, DM, R hip fractures, leukemia, R femur IM nail s/p fall 04/12/16, multiple CVAs this year    Patient Stated Goals to regain independence    Currently in Pain? No/denies            [facility] PT Assessment - 11/07/16 0001      Strength   Right Hip Flexion 3-/5   Right Hip Extension 2/5   Right Hip ABduction 3/5   Left Hip Flexion 3-/5   Left Hip Extension 2/5   Left Hip ABduction 3+/5   Right Knee Extension 5/5   Left Knee Extension 5/5   Right Ankle Dorsiflexion 5/5   Left Ankle Dorsiflexion 5/5     Transfers   Five time sit to stand comments  84 seconds with UEs      High Level Balance   High Level  Balance Comments genearl unsteadiness remains; refuses to take hands off of support for tandem stance balance test                      [facility] Adult PT Treatment/Exercise - 11/07/16 0001      Ambulation/Gait   Gait Comments 0.126 m/s with walker; moderate difficulty in stair ascent (7 inch steps) with B railings and step to pattern but could not descend, had to descend 4 inch stairs with B railings and also moderate difficulty                 PT Education - 11/07/16 1351    Education provided Yes   Education Details objective findings today, progress towards goals; DC today due to general lack of progress    Person(s) Educated Patient   Methods Explanation   Comprehension Verbalized understanding;Need  further instruction          PT Short Term Goals - 11/07/16 1325      PT SHORT TERM GOAL #1   Title Patient to be able to complete all functional mobility, including bed mobility, with Mod(I) in order to improve functional independence    Baseline 8/30- Min assist for bed mobility    Time 4   Period Weeks   Status Partially Met     PT SHORT TERM GOAL #2   Title Patient to be able to ambulate at gait speed of at least 0.11ms with walker in order to improve access to home and community    Baseline 8/30- 0.14m   Time 4   Period Weeks   Status Not Met     PT SHORT TERM GOAL #3   Title Patient to be able to complete 5 time sit to stand test in 30 seconds with UEs in order to show improved functional mobility and strength    Baseline 8/30- 84 seconds with UEs    Time 4   Period Weeks   Status Not Met     PT SHORT TERM GOAL #4   Title Patient to be able to correctly and consistently perform HEP at least 5 days per week, to be updated as appropriate    Baseline 8/30- 6 days a week   Time 1   Period Weeks   Status Achieved           PT Long Term Goals - 11/07/16 1327      PT LONG TERM GOAL #1   Title Patient to show functional strength as being 4+/5 in order to improve mobilty and balance    Baseline 8/30- most muscle groups especailly proximally are weak; LEs have good strength    Time 8   Period Weeks   Status Partially Met     PT LONG TERM GOAL #2   Title Patient to be able to maintain tandem stance for at least 20 seconds bilaterally on solid surface and TUG test in 20 seconds with walker in order to show imrpoved balance and reduced fall risk    Baseline 8/30- cannot perform tandem stance without UE support, DNT TUG today but measure on 8/17 62 seconds    Time 8   Period Weeks   Status Not Met     PT LONG TERM GOAL #3   Title Paitent to be able to ascend and descend full flight of stairs with U railing and min guard, step to pattern, no unsteadiness or fatigue,  in order to improve home and community access  Baseline Nov 22, 2022- severe limitation, unable to descend 7 inch steps so had to descend 4 inch side    Time 8   Period Weeks   Status Not Met     PT LONG TERM GOAL #4   Title Patient to be able to ambulate at at least 0.92ms with walker in order to improve access to home and community, also to show general reduced fall risk    Baseline 82024/09/13 0.188m                Plan - [id number]    Clinical Impression Statement Early re-assessment performed today due to DPT concerns regarding progress with PT. Patient does show mild improvement in gait speed however all other objective measures taken today are generally the same or worse than at initial evaluation. Attempted stairs with patient with B railings with moderate difficulty with step to pattern on ascent, however unable to descend 7 inch steps despite B HHA and assist from PT, and had to instead descend 4 inch stairs with B HHA and moderate difficulty. Patient has displayed poor carryover of all education and cues provided by PT throughout therapy, and at this point shows minimal progress with skilled PT services. Recommend DC today due to general lack of progress.    Rehab Potential Good   Clinical Impairments Affecting Rehab Potential (+) supportive family; (-) hx of fall with serious injury, multiple recent CVAs   PT Next Visit Plan DC today    PT Home Exercise Plan Eval: bridges, supine hip ABD with red TB, LAQs with red TB    Consulted and Agree with Plan of Care Patient      Patient will benefit from skilled therapeutic intervention in order to improve the following deficits and impairments:  Abnormal gait, Decreased coordination, Decreased mobility, Decreased activity tolerance, Decreased strength, Decreased balance, Decreased safety awareness, Difficulty walking  Visit Diagnosis: Muscle weakness (generalized)  Difficulty in walking, not elsewhere classified  Unsteadiness on  feet       G-Codes - 08September 13, 2018354    Functional Assessment Tool Used (Outpatient Only) Based on skilled clinical assessment of strength, balance, gait pattern, fall risk    Functional Limitation Mobility: Walking and moving around   Mobility: Walking and Moving Around Goal Status ([id number] least 40 percent but less than 60 percent impaired, limited or restricted   Mobility: Walking and Moving Around Discharge Status ([phone] least 60 percent but less than 80 percent impaired, limited or restricted      Problem List Patient Active Problem List   Diagnosis Date Noted  . Controlled diabetes mellitus type 2 with complications ([facility]0626/41/5830. Physical deconditioning   . Acute ischemic stroke ([facility]06/02/2017  . Pressure injury of skin 08/17/2016  . Acute CVA (cerebrovascular accident) ([facility]06/11/2016  . UTI (urinary tract infection) 08/16/2016  . Constipation 07/10/2016  . Dysphagia as late effect of cerebrovascular accident (CVA) 07/10/2016  . Loss of weight 07/10/2016  . Impaired glucose tolerance 06/05/2016  . Slurred speech   . Hypokalemia 04/13/2016  . Hyperglycemia 04/13/2016  . Essential hypertension 04/13/2016  . Hyperlipidemia 04/13/2016  . S/P right hip fracture 04/12/2016  . Hip fracture ([facility]02/04/2016  . Fall   . Itching in the vaginal area 10/05/2014  . Chronic myelogenous leukemia (CML), BCR-ABL1-positive ([facility]  . GERD (gastroesophageal reflux disease) 12/06/2013  . Leukocytosis 08/06/2013  . Atypical chest pain 08/06/2013  . Chronic diarrhea 08/05/2013  . Pessary maintenance 07/01/2013  PHYSICAL THERAPY DISCHARGE SUMMARY  Visits from Start of Care: 12  Current functional level related to goals / functional outcomes: Patient has not made significant enough improvement with skilled PT services to justify continuation of skilled services at this point; DC today due to lack of progress.    Remaining deficits: See above    Education /  Equipment: See above  Plan: Patient agrees to discharge.  Patient goals were not met. Patient is being discharged due to lack of progress.  ?????      Deniece Ree PT, DPT [facility] [address] [place], [place], [address] Phone: [phone]   Fax:  [phone]  Name: April Page MRN: [id number] Date of Birth: 03/23/1945

## 2016-11-07 NOTE — Therapy (Signed)
[place], [place], [address] Phone: [phone]   Fax:  [phone]  Occupational Therapy Treatment  Patient Details  Name: April Page MRN: [id number] Date of Birth: Nov 16, 1945 Referring Provider: Dr. Redmond School  Encounter Date: 11/07/2016      OT End of Session - 11/07/16 1452    Visit Number 10   Number of Visits 16   Date for OT Re-Evaluation 11/26/16   Authorization Type [employer]   Authorization Time Period Before 19th visit   Authorization - Visit Number 10   Authorization - Number of Visits 19   OT Start Time 1347   OT Stop Time 1430   OT Time Calculation (min) 43 min   Activity Tolerance Patient tolerated treatment well   Behavior During Therapy [facility] for tasks assessed/performed      Past Medical History:  Diagnosis Date  . Arthritis    stiff knees  . Blood dyscrasia   . Cancer ([place])   . CVA (cerebral vascular accident) ([place])   . GERD (gastroesophageal reflux disease)   . H/O: CML (chronic myeloid leukemia)   . Hyperlipidemia   . Hyperlipidemia   . Hypertension   . Obesity   . Pessary maintenance 07/01/2013  . Stroke ([place])    x 3    Past Surgical History:  Procedure Laterality Date  . ABDOMINAL HYSTERECTOMY    . CHOLECYSTECTOMY    . COLONOSCOPY  06/09/2002   CZY:SAYTKZSW hemorrhoids; otherwise normal rectum, colon   . COLONOSCOPY N/A 08/25/2013   FUX:NATFTDD diverticulosis. Single colonic polyp-removed  s/p segmental biopsy and stool sample. random colon bx negative. +benign leiomyoma.  . cystocele/rectocele repair  2009  . ESOPHAGOGASTRODUODENOSCOPY  06/09/2002   UKG:URKYHC upper gastrointestinal tract s/p  54-French Maloney dilator  . FEMUR IM NAIL Right 04/12/2016   Procedure: INTRAMEDULLARY (IM) NAIL FEMORAL;  Surgeon: Nicholes Stairs, MD;  Location: [facility];  Service: Orthopedics;  Laterality: Right;  . HERNIA REPAIR    . LOOP RECORDER INSERTION N/A 08/23/2016   Procedure: Loop Recorder Insertion;  Surgeon: Constance Haw, MD;  Location: [place] [facility];  Service: Cardiovascular;  Laterality: N/A;  . OOPHORECTOMY    . TEE WITHOUT CARDIOVERSION N/A 08/23/2016   Procedure: TRANSESOPHAGEAL ECHOCARDIOGRAM (TEE);  Surgeon: Fay Records, MD;  Location: [facility];  Service: Cardiovascular;  Laterality: N/A;    There were no vitals filed for this visit.      Subjective Assessment - 11/07/16 1346    Subjective  S: I don't do this at home. (arm strengthening)   Currently in Pain? No/denies            [facility] OT Assessment - 11/07/16 1345      Assessment   Diagnosis s/p CVA     Precautions   Precautions Fall                  OT Treatments/Exercises (OP) - 11/07/16 1347      Exercises   Exercises Shoulder;Hand;Elbow     Shoulder Exercises: Seated   Protraction Strengthening;10 reps   Protraction Weight (lbs) 1   Horizontal ABduction AROM;10 reps   Flexion Strengthening;10 reps   Flexion Weight (lbs) 1     Elbow Exercises   Elbow Extension Strengthening;10 reps   Bar Weights/Barbell (Elbow Extension) 1 lb   Other elbow exercises elbow flexion, 10X, 1# weight     Additional Elbow Exercises   Hand Gripper with Large Beads  right hand: 29# all beads; left hand 29# all beads   Hand Gripper with Medium Beads right hand: 25# all beads; left hand: 29#      Hand Exercises   Sponges Pt used red clothespin to grasp and plac 20 sponges into bucket using right hand and alternating between lateral and a four finger pinch. Improvement in coordination when grasping sponges noted.      Fine Motor Coordination   Fine Motor Coordination Grooved pegs   Grooved pegs Pt used right hand to place pegs into pegboard. Mod difficulty manipulating pegs into holes. Increased time required for task, pt placed 4 pegs total in 3 minutes                  OT Short Term Goals - 10/29/16 1547      OT SHORT TERM GOAL #1   Title Pt  will be provided with and educated on HEP for improved independence during ADL completion.    Time 4   Period Weeks   Status On-going     OT SHORT TERM GOAL #2   Title Pt will improve independence in ADL completion by decreasing level of assistance required during bathing tasks from mod/max to minimal.    Time 4   Period Weeks   Status On-going     OT SHORT TERM GOAL #3   Title Pt will improve BUE grip strength by 6# and pinch strength by 2# to improve ability to grasp and hold grooming items.    Time 4   Period Weeks   Status Partially Met     OT SHORT TERM GOAL #4   Title Pt will improve right fine motor coordination required for ADL completion by completing 9 hole peg test in less than 1'.    Time 4   Period Weeks           OT Long Term Goals - 10/25/16 1435      OT LONG TERM GOAL #1   Title Pt will return to highest level of functioning and independence in B/ADL tasks, completing tasks at supervision level or better.    Time 8   Period Weeks   Status On-going     OT LONG TERM GOAL #2   Title Pt will improve BUE strength to 4+/5 to improve ability to complete ADL tasks with greater independence.    Time 8   Period Weeks   Status On-going     OT LONG TERM GOAL #3   Title Pt will improve BUE grip strength by 12# and pinch strength by 4# to improve ability to hold and utilize items for light meal preparation.    Time 8   Period Weeks   Status Partially Met     OT LONG TERM GOAL #4   Title Pt will improve right fine motor coordination by completing 9 hole peg test in 45" or less.    Time 8   Period Weeks   Status On-going               Plan - 11/07/16 1452    Clinical Impression Statement A: Resumed BUE strengthening today, OT holding hand out at various heights to give pt a target to reach for. Pt with improvements in time of completion and with form, continues to required verbal and visual cuing for form and technique. Resumed grip strengthening,  significantly increased time required for completion with right hand. Pinch strengthening completed, pt with improvement in time of completion  and with technqiue.    Plan P: Continue with BUE strengthening, grip/pinch, and coordination tasks.    OT Home Exercise Plan 7/20: red theraputty for grip and pinch strengthening, fine motor coordination tasks   Consulted and Agree with Plan of Care Patient      Patient will benefit from skilled therapeutic intervention in order to improve the following deficits and impairments:  Decreased activity tolerance, Decreased strength, Impaired UE functional use, Decreased coordination  Visit Diagnosis: Muscle weakness (generalized)  Other symptoms and signs involving the musculoskeletal system  Other lack of coordination    Problem List Patient Active Problem List   Diagnosis Date Noted  . Controlled diabetes mellitus type 2 with complications ([place]) 67/73/7366  . Physical deconditioning   . Acute ischemic stroke ([place]) 08/20/2016  . Pressure injury of skin 08/17/2016  . Acute CVA (cerebrovascular accident) ([place]) 08/17/2016  . UTI (urinary tract infection) 08/16/2016  . Constipation 07/10/2016  . Dysphagia as late effect of cerebrovascular accident (CVA) 07/10/2016  . Loss of weight 07/10/2016  . Impaired glucose tolerance 06/05/2016  . Slurred speech   . Hypokalemia 04/13/2016  . Hyperglycemia 04/13/2016  . Essential hypertension 04/13/2016  . Hyperlipidemia 04/13/2016  . S/P right hip fracture 04/12/2016  . Hip fracture ([place]) 04/12/2016  . Fall   . Itching in the vaginal area 10/05/2014  . Chronic myelogenous leukemia (CML), BCR-ABL1-positive ([place])   . GERD (gastroesophageal reflux disease) 12/06/2013  . Leukocytosis 08/06/2013  . Atypical chest pain 08/06/2013  . Chronic diarrhea 08/05/2013  . Pessary maintenance 07/01/2013    Guadelupe Sabin, OTR/L  [phone] 11/07/2016, 2:56 PM  [facility] [address] [place], [place], [address] Phone: [phone]   Fax:  [phone]  Name: April Page MRN: [id number] Date of Birth: Dec 19, 1945

## 2016-11-12 ENCOUNTER — Ambulatory Visit (HOSPITAL_COMMUNITY): Payer: Medicare HMO | Attending: Internal Medicine | Admitting: Occupational Therapy

## 2016-11-12 ENCOUNTER — Encounter (HOSPITAL_COMMUNITY): Payer: Self-pay | Admitting: Occupational Therapy

## 2016-11-12 ENCOUNTER — Ambulatory Visit (HOSPITAL_COMMUNITY): Payer: Medicare HMO | Admitting: Physical Therapy

## 2016-11-12 DIAGNOSIS — R278 Other lack of coordination: Secondary | ICD-10-CM | POA: Insufficient documentation

## 2016-11-12 DIAGNOSIS — M6281 Muscle weakness (generalized): Secondary | ICD-10-CM | POA: Diagnosis not present

## 2016-11-12 DIAGNOSIS — R29898 Other symptoms and signs involving the musculoskeletal system: Secondary | ICD-10-CM | POA: Diagnosis not present

## 2016-11-12 NOTE — Therapy (Signed)
[place], [place], [address] Phone: [phone]   Fax:  [phone]  Occupational Therapy Treatment  Patient Details  Name: April Page MRN: [id number] Date of Birth: 06-Jan-1946 Referring Provider: Dr. Redmond School  Encounter Date: 11/12/2016      OT End of Session - 11/12/16 1433    Visit Number 11   Number of Visits 16   Date for OT Re-Evaluation 11/26/16   Authorization Type [employer]   Authorization Time Period Before 19th visit   Authorization - Visit Number 11   Authorization - Number of Visits 19   OT Start Time 1353  pt arrived late   OT Stop Time 1430   OT Time Calculation (min) 37 min   Activity Tolerance Patient tolerated treatment well   Behavior During Therapy [facility] for tasks assessed/performed      Past Medical History:  Diagnosis Date  . Arthritis    stiff knees  . Blood dyscrasia   . Cancer ([place])   . CVA (cerebral vascular accident) ([place])   . GERD (gastroesophageal reflux disease)   . H/O: CML (chronic myeloid leukemia)   . Hyperlipidemia   . Hyperlipidemia   . Hypertension   . Obesity   . Pessary maintenance 07/01/2013  . Stroke ([place])    x 3    Past Surgical History:  Procedure Laterality Date  . ABDOMINAL HYSTERECTOMY    . CHOLECYSTECTOMY    . COLONOSCOPY  06/09/2002   JIR:CVELFYBO hemorrhoids; otherwise normal rectum, colon   . COLONOSCOPY N/A 08/25/2013   FBP:ZWCHENI diverticulosis. Single colonic polyp-removed  s/p segmental biopsy and stool sample. random colon bx negative. +benign leiomyoma.  . cystocele/rectocele repair  2009  . ESOPHAGOGASTRODUODENOSCOPY  06/09/2002   DPO:EUMPNT upper gastrointestinal tract s/p  54-French Maloney dilator  . FEMUR IM NAIL Right 04/12/2016   Procedure: INTRAMEDULLARY (IM) NAIL FEMORAL;  Surgeon: Nicholes Stairs, MD;  Location: [place];  Service: Orthopedics;  Laterality: Right;  . HERNIA REPAIR    . LOOP RECORDER INSERTION  N/A 08/23/2016   Procedure: Loop Recorder Insertion;  Surgeon: Constance Haw, MD;  Location: [place] CV [facility];  Service: Cardiovascular;  Laterality: N/A;  . OOPHORECTOMY    . TEE WITHOUT CARDIOVERSION N/A 08/23/2016   Procedure: TRANSESOPHAGEAL ECHOCARDIOGRAM (TEE);  Surgeon: Fay Records, MD;  Location: [place];  Service: Cardiovascular;  Laterality: N/A;    There were no vitals filed for this visit.      Subjective Assessment - 11/12/16 1402    Subjective  S: I've been practicing   Currently in Pain? No/denies            [facility] OT Assessment - 11/12/16 1402      Assessment   Diagnosis s/p CVA     Precautions   Precautions Fall                  OT Treatments/Exercises (OP) - 11/12/16 1402      Exercises   Exercises Shoulder;Hand;Elbow     Shoulder Exercises: Seated   Protraction Strengthening;10 reps   Protraction Weight (lbs) 1   Horizontal ABduction AROM;10 reps   Flexion Strengthening;10 reps   Flexion Weight (lbs) 1   Abduction AROM;10 reps   Other Seated Exercises green physio gymnic ball: overhead press, wood chops each direction, 10X     Elbow Exercises   Elbow Extension Strengthening;10 reps   Bar Weights/Barbell (Elbow Extension) 1 lb   Other  elbow exercises elbow flexion, 10X, 1# weight     Hand Exercises   Other Hand Exercises Pt used pvc pipe to cut circles into red theraputty. Pt alternated right and left hands, min difficulty     Functional Reaching Activities   Mid Level Pt completed pinch tree this session, focusing on shoulder protraction and flexion as well as pinch strengthening. Pt placed 26 pins along vertical pole using right arm/hand with lateral pinch. Pt then removed pins with left arm/hand and 3 point pinch.                   OT Short Term Goals - 10/29/16 1547      OT SHORT TERM GOAL #1   Title Pt will be provided with and educated on HEP for improved independence during ADL completion.    Time 4    Period Weeks   Status On-going     OT SHORT TERM GOAL #2   Title Pt will improve independence in ADL completion by decreasing level of assistance required during bathing tasks from mod/max to minimal.    Time 4   Period Weeks   Status On-going     OT SHORT TERM GOAL #3   Title Pt will improve BUE grip strength by 6# and pinch strength by 2# to improve ability to grasp and hold grooming items.    Time 4   Period Weeks   Status Partially Met     OT SHORT TERM GOAL #4   Title Pt will improve right fine motor coordination required for ADL completion by completing 9 hole peg test in less than 1'.    Time 4   Period Weeks           OT Long Term Goals - 10/25/16 1435      OT LONG TERM GOAL #1   Title Pt will return to highest level of functioning and independence in B/ADL tasks, completing tasks at supervision level or better.    Time 8   Period Weeks   Status On-going     OT LONG TERM GOAL #2   Title Pt will improve BUE strength to 4+/5 to improve ability to complete ADL tasks with greater independence.    Time 8   Period Weeks   Status On-going     OT LONG TERM GOAL #3   Title Pt will improve BUE grip strength by 12# and pinch strength by 4# to improve ability to hold and utilize items for light meal preparation.    Time 8   Period Weeks   Status Partially Met     OT LONG TERM GOAL #4   Title Pt will improve right fine motor coordination by completing 9 hole peg test in 45" or less.    Time 8   Period Weeks   Status On-going               Plan - 11/12/16 1425    Clinical Impression Statement A: Continued with BUE strengthening, added therapy ball exercises. Also added hand strengthening with red theraputty and pvc pipe this session. Pt with improved form and activity tolerance this session, continued to give pt target for BUE strengthening exercises.    Plan P: Continue with BUE strengthening, resume coordination tasks. Add BUE to HEP   OT Home Exercise  Plan 7/20: red theraputty for grip and pinch strengthening, fine motor coordination tasks   Consulted and Agree with Plan of Care Patient  Patient will benefit from skilled therapeutic intervention in order to improve the following deficits and impairments:  Decreased activity tolerance, Decreased strength, Impaired UE functional use, Decreased coordination  Visit Diagnosis: Muscle weakness (generalized)  Other symptoms and signs involving the musculoskeletal system  Other lack of coordination    Problem List Patient Active Problem List   Diagnosis Date Noted  . Controlled diabetes mellitus type 2 with complications ([place]) 54/98/2641  . Physical deconditioning   . Acute ischemic stroke ([place]) 08/20/2016  . Pressure injury of skin 08/17/2016  . Acute CVA (cerebrovascular accident) ([place]) 08/17/2016  . UTI (urinary tract infection) 08/16/2016  . Constipation 07/10/2016  . Dysphagia as late effect of cerebrovascular accident (CVA) 07/10/2016  . Loss of weight 07/10/2016  . Impaired glucose tolerance 06/05/2016  . Slurred speech   . Hypokalemia 04/13/2016  . Hyperglycemia 04/13/2016  . Essential hypertension 04/13/2016  . Hyperlipidemia 04/13/2016  . S/P right hip fracture 04/12/2016  . Hip fracture ([place]) 04/12/2016  . Fall   . Itching in the vaginal area 10/05/2014  . Chronic myelogenous leukemia (CML), BCR-ABL1-positive ([place])   . GERD (gastroesophageal reflux disease) 12/06/2013  . Leukocytosis 08/06/2013  . Atypical chest pain 08/06/2013  . Chronic diarrhea 08/05/2013  . Pessary maintenance 07/01/2013   Guadelupe Sabin, OTR/L  [phone] 11/12/2016, 2:34 PM  [place] [address] [place], [place], [address] Phone: [phone]   Fax:  [phone]  Name: TALLULAH HOSMAN MRN: [id number] Date of Birth: 1945/09/26

## 2016-11-14 ENCOUNTER — Ambulatory Visit (HOSPITAL_COMMUNITY): Payer: Medicare HMO

## 2016-11-14 ENCOUNTER — Ambulatory Visit (HOSPITAL_COMMUNITY): Payer: Medicare HMO | Admitting: Occupational Therapy

## 2016-11-14 DIAGNOSIS — R29898 Other symptoms and signs involving the musculoskeletal system: Secondary | ICD-10-CM

## 2016-11-14 DIAGNOSIS — M6281 Muscle weakness (generalized): Secondary | ICD-10-CM | POA: Diagnosis not present

## 2016-11-14 DIAGNOSIS — R278 Other lack of coordination: Secondary | ICD-10-CM | POA: Diagnosis not present

## 2016-11-14 NOTE — Patient Instructions (Addendum)
Perform each exercise ___12_____ reps. 1-2x days.   1) Protraction  Start by holding a wand or cane at chest height.  Next, slowly push the wand outwards in front of your body so that your elbows become fully straightened. Then, return to the original position.     2) Shoulder FLEXION  In the standing position, hold a wand/cane with both arms, palms down on both sides. Raise up the wand/cane allowing your unaffected arm to perform most of the effort.          3) Horizontal abduction/adduction             Begin with arms straight out in front of you, bring out to the side in at "T" shape. Keep arms straight entire time.      4) Shoulder Abduction  Standing:       Lying on your back begin with your arms flat on the table next to your side. Slowly move your arms out to the side so that they go overhead, in a jumping jack or snow angel movement.     Fine Motor Coordination Exercises:  1) Coins: practice picking coins up off of a table and stacking them. Also, hold several coins in your hand and practice bringing them from your palm to your fingertips using your fingers (not shaking your hand!) and drop them into a cup.   2) Paperclips: Practice linking and unlinking paperclips. Alternate hands to practice with both hands.

## 2016-11-14 NOTE — Therapy (Signed)
[place], [place], [address] Phone: [phone]   Fax:  [phone]  Occupational Therapy Treatment  Patient Details  Name: April Page MRN: [id number] Date of Birth: 25-Apr-1945 Referring Provider: Dr. Redmond School  Encounter Date: 11/14/2016      OT End of Session - 11/14/16 1500    Visit Number 12   Number of Visits 16   Date for OT Re-Evaluation 11/26/16   Authorization Type [employer]   Authorization Time Period Before 19th visit   Authorization - Visit Number 12   Authorization - Number of Visits 19   OT Start Time 1402  pt arrived late   OT Stop Time 1431   OT Time Calculation (min) 29 min   Activity Tolerance Patient tolerated treatment well   Behavior During Therapy [facility] for tasks assessed/performed      Past Medical History:  Diagnosis Date  . Arthritis    stiff knees  . Blood dyscrasia   . Cancer ([place])   . CVA (cerebral vascular accident) ([place])   . GERD (gastroesophageal reflux disease)   . H/O: CML (chronic myeloid leukemia)   . Hyperlipidemia   . Hyperlipidemia   . Hypertension   . Obesity   . Pessary maintenance 07/01/2013  . Stroke ([place])    x 3    Past Surgical History:  Procedure Laterality Date  . ABDOMINAL HYSTERECTOMY    . CHOLECYSTECTOMY    . COLONOSCOPY  06/09/2002   BWI:OMBTDHRC hemorrhoids; otherwise normal rectum, colon   . COLONOSCOPY N/A 08/25/2013   BUL:AGTXMIW diverticulosis. Single colonic polyp-removed  s/p segmental biopsy and stool sample. random colon bx negative. +benign leiomyoma.  . cystocele/rectocele repair  2009  . ESOPHAGOGASTRODUODENOSCOPY  06/09/2002   OEH:OZYYQM upper gastrointestinal tract s/p  54-French Maloney dilator  . FEMUR IM NAIL Right 04/12/2016   Procedure: INTRAMEDULLARY (IM) NAIL FEMORAL;  Surgeon: Nicholes Stairs, MD;  Location: [place];  Service: Orthopedics;  Laterality: Right;  . HERNIA REPAIR    . LOOP RECORDER INSERTION  N/A 08/23/2016   Procedure: Loop Recorder Insertion;  Surgeon: Constance Haw, MD;  Location: [place] [facility];  Service: Cardiovascular;  Laterality: N/A;  . OOPHORECTOMY    . TEE WITHOUT CARDIOVERSION N/A 08/23/2016   Procedure: TRANSESOPHAGEAL ECHOCARDIOGRAM (TEE);  Surgeon: Fay Records, MD;  Location: [place];  Service: Cardiovascular;  Laterality: N/A;    There were no vitals filed for this visit.      Subjective Assessment - 11/14/16 1408    Subjective  S:    Currently in Pain? No/denies            [facility] OT Assessment - 11/14/16 1408      Assessment   Diagnosis s/p CVA     Precautions   Precautions Fall                  OT Treatments/Exercises (OP) - 11/14/16 1408      Exercises   Exercises Shoulder;Hand;Elbow     Shoulder Exercises: Seated   Row Theraband;10 reps   Theraband Level (Shoulder Row) Level 2 (Red)   Protraction Strengthening;10 reps   Protraction Weight (lbs) 1   Horizontal ABduction AROM;12 reps   Flexion Strengthening;10 reps   Flexion Weight (lbs) 1   Abduction AROM;12 reps     Shoulder Exercises: ROM/Strengthening   X to V Arms 10X     Elbow Exercises   Elbow Extension Strengthening;10 reps  Bar Weights/Barbell (Elbow Extension) 1 lb   Forearm Supination Strengthening;10 reps  1#   Forearm Pronation Strengthening;10 reps  1#   Other elbow exercises elbow flexion, 10X, 1# weight     Fine Motor Coordination   Other Fine Motor Exercises Pt used right hand and red clothespin to stack 5 sponges, mod difficulty with coordination. 3 trials.                 OT Education - 11/14/16 1427    Education provided Yes   Education Details shoulder exercises and fine motor coordination activities    Person(s) Educated Patient   Methods Explanation;Demonstration;Handout   Comprehension Verbalized understanding;Returned demonstration          OT Short Term Goals - 10/29/16 1547      OT SHORT TERM GOAL #1    Title Pt will be provided with and educated on HEP for improved independence during ADL completion.    Time 4   Period Weeks   Status On-going     OT SHORT TERM GOAL #2   Title Pt will improve independence in ADL completion by decreasing level of assistance required during bathing tasks from mod/max to minimal.    Time 4   Period Weeks   Status On-going     OT SHORT TERM GOAL #3   Title Pt will improve BUE grip strength by 6# and pinch strength by 2# to improve ability to grasp and hold grooming items.    Time 4   Period Weeks   Status Partially Met     OT SHORT TERM GOAL #4   Title Pt will improve right fine motor coordination required for ADL completion by completing 9 hole peg test in less than 1'.    Time 4   Period Weeks           OT Long Term Goals - 10/25/16 1435      OT LONG TERM GOAL #1   Title Pt will return to highest level of functioning and independence in B/ADL tasks, completing tasks at supervision level or better.    Time 8   Period Weeks   Status On-going     OT LONG TERM GOAL #2   Title Pt will improve BUE strength to 4+/5 to improve ability to complete ADL tasks with greater independence.    Time 8   Period Weeks   Status On-going     OT LONG TERM GOAL #3   Title Pt will improve BUE grip strength by 12# and pinch strength by 4# to improve ability to hold and utilize items for light meal preparation.    Time 8   Period Weeks   Status Partially Met     OT LONG TERM GOAL #4   Title Pt will improve right fine motor coordination by completing 9 hole peg test in 45" or less.    Time 8   Period Weeks   Status On-going               Plan - 11/14/16 1605    Clinical Impression Statement A: Pt arrived late today, continued with BUE strengthening adding row with theraband. Pt requiring verbal cuing for form, also continued to provide target for pt to touch with dowel rod. Pt with improved activity tolerance today during BUE exercises. Added  sponge stacking coordination task, pt with mod difficulty grasping and stacking sponges.    Plan P: Follow up on HEP. Continue with strengthening and coordination tasks.  OT Home Exercise Plan 7/20: red theraputty for grip and pinch strengthening, fine motor coordination tasks; BUE strengthening and coordination   Consulted and Agree with Plan of Care Patient      Patient will benefit from skilled therapeutic intervention in order to improve the following deficits and impairments:  Decreased activity tolerance, Decreased strength, Impaired UE functional use, Decreased coordination  Visit Diagnosis: Muscle weakness (generalized)  Other symptoms and signs involving the musculoskeletal system  Other lack of coordination    Problem List Patient Active Problem List   Diagnosis Date Noted  . Controlled diabetes mellitus type 2 with complications ([place]) 11/65/7903  . Physical deconditioning   . Acute ischemic stroke ([place]) 08/20/2016  . Pressure injury of skin 08/17/2016  . Acute CVA (cerebrovascular accident) ([place]) 08/17/2016  . UTI (urinary tract infection) 08/16/2016  . Constipation 07/10/2016  . Dysphagia as late effect of cerebrovascular accident (CVA) 07/10/2016  . Loss of weight 07/10/2016  . Impaired glucose tolerance 06/05/2016  . Slurred speech   . Hypokalemia 04/13/2016  . Hyperglycemia 04/13/2016  . Essential hypertension 04/13/2016  . Hyperlipidemia 04/13/2016  . S/P right hip fracture 04/12/2016  . Hip fracture ([place]) 04/12/2016  . Fall   . Itching in the vaginal area 10/05/2014  . Chronic myelogenous leukemia (CML), BCR-ABL1-positive ([place])   . GERD (gastroesophageal reflux disease) 12/06/2013  . Leukocytosis 08/06/2013  . Atypical chest pain 08/06/2013  . Chronic diarrhea 08/05/2013  . Pessary maintenance 07/01/2013   Guadelupe Sabin, OTR/L  [phone] 11/14/2016, 4:29 PM  [facility] [address] [place],  [place], [address] Phone: [phone]   Fax:  [phone]  Name: April Page MRN: [id number] Date of Birth: [birth year]

## 2016-11-19 ENCOUNTER — Encounter (HOSPITAL_COMMUNITY): Payer: Self-pay | Admitting: Occupational Therapy

## 2016-11-19 ENCOUNTER — Ambulatory Visit (HOSPITAL_COMMUNITY): Payer: Medicare HMO | Admitting: Occupational Therapy

## 2016-11-19 ENCOUNTER — Ambulatory Visit (HOSPITAL_COMMUNITY): Payer: Medicare HMO | Admitting: Physical Therapy

## 2016-11-19 DIAGNOSIS — R29898 Other symptoms and signs involving the musculoskeletal system: Secondary | ICD-10-CM

## 2016-11-19 DIAGNOSIS — M6281 Muscle weakness (generalized): Secondary | ICD-10-CM

## 2016-11-19 DIAGNOSIS — R278 Other lack of coordination: Secondary | ICD-10-CM | POA: Diagnosis not present

## 2016-11-19 NOTE — Therapy (Signed)
[place], [place], [address] Phone: [phone]   Fax:  [phone]  Occupational Therapy Treatment  Patient Details  Name: April Page MRN: [id number] Date of Birth: [birth year] Referring Provider: Dr. Redmond School  Encounter Date: 11/19/2016      OT End of Session - 11/19/16 1604    Visit Number 13   Number of Visits 16   Date for OT Re-Evaluation 11/26/16   Authorization Type [employer]   Authorization Time Period Before 19th visit   Authorization - Visit Number 13   Authorization - Number of Visits 19   OT Start Time 1433   OT Stop Time 1515   OT Time Calculation (min) 42 min   Activity Tolerance Patient tolerated treatment well   Behavior During Therapy [facility] for tasks assessed/performed      Past Medical History:  Diagnosis Date  . Arthritis    stiff knees  . Blood dyscrasia   . Cancer ([place])   . CVA (cerebral vascular accident) ([place])   . GERD (gastroesophageal reflux disease)   . H/O: CML (chronic myeloid leukemia)   . Hyperlipidemia   . Hyperlipidemia   . Hypertension   . Obesity   . Pessary maintenance 07/01/2013  . Stroke ([place])    x 3    Past Surgical History:  Procedure Laterality Date  . ABDOMINAL HYSTERECTOMY    . CHOLECYSTECTOMY    . COLONOSCOPY  06/09/2002   H[facility]:NOBSJGGE hemorrhoids; otherwise normal rectum, colon   . COLONOSCOPY N/A 08/25/2013   ZMO:[id number] diverticulosis. Single colonic polyp-removed  s/p segmental biopsy and stool sample. random colon bx negative. +benign leiomyoma.  . cystocele/rectocele repair  2009  . ESOPHAGOGASTRODUODENOSCOPY  06/09/2002   YTK:[id number] upper gastrointestinal tract s/p  54-French Maloney dilator  . FEMUR IM NAIL Right 04/12/2016   Procedure: INTRAMEDULLARY (IM) NAIL FEMORAL;  Surgeon: Nicholes Stairs, MD;  Location: [facility];  Service: Orthopedics;  Laterality: Right;  . HERNIA REPAIR    . LOOP RECORDER INSERTION N/A 08/23/2016    Procedure: Loop Recorder Insertion;  Surgeon: Constance Haw, MD;  Location: [place];  Service: Cardiovascular;  Laterality: N/A;  . OOPHORECTOMY    . TEE WITHOUT CARDIOVERSION N/A 08/23/2016   Procedure: TRANSESOPHAGEAL ECHOCARDIOGRAM (TEE);  Surgeon: Fay Records, MD;  Location: [facility];  Service: Cardiovascular;  Laterality: N/A;    There were no vitals filed for this visit.      Subjective Assessment - 11/19/16 1432    Subjective  S: I tried the exercises you gave me.    Currently in Pain? No/denies            [facility] OT Assessment - 11/19/16 1432      Assessment   Diagnosis s/p CVA     Precautions   Precautions Fall                  OT Treatments/Exercises (OP) - 11/19/16 1440      Exercises   Exercises Shoulder;Hand;Elbow     Shoulder Exercises: Seated   Protraction Strengthening;10 reps   Protraction Weight (lbs) 2   Horizontal ABduction AROM;12 reps   Flexion Strengthening;10 reps   Flexion Weight (lbs) 2   Abduction AROM;12 reps     Shoulder Exercises: ROM/Strengthening   X to V Arms 10X     Elbow Exercises   Elbow Extension Strengthening;15 reps   Bar Weights/Barbell (Elbow Extension) 1 lb   Forearm Supination  Strengthening;15 reps  1#   Forearm Pronation Strengthening;15 reps  1#   Other elbow exercises elbow flexion, 15X, 1# weight     Additional Elbow Exercises   Hand Gripper with Large Beads right hand: 29# all beads; left hand 29# all beads   Hand Gripper with Medium Beads right hand: 25# all beads; left hand: 25# all beads    Hand Gripper with Small Beads right hand 2/14 beads 25#; left hand 1/12 beads gripper 25#     Hand Exercises   Small Pegboard Pt completed pegboard task this session using tweezers and right hand to place pegs along each side of pegboard. Pt with mod/max difficulty with task. OT provided verbal cuing for technique. Significantly increased time for completion-pt placed 4 pegs                   OT Short Term Goals - 10/29/16 1547      OT SHORT TERM GOAL #1   Title Pt will be provided with and educated on HEP for improved independence during ADL completion.    Time 4   Period Weeks   Status On-going     OT SHORT TERM GOAL #2   Title Pt will improve independence in ADL completion by decreasing level of assistance required during bathing tasks from mod/max to minimal.    Time 4   Period Weeks   Status On-going     OT SHORT TERM GOAL #3   Title Pt will improve BUE grip strength by 6# and pinch strength by 2# to improve ability to grasp and hold grooming items.    Time 4   Period Weeks   Status Partially Met     OT SHORT TERM GOAL #4   Title Pt will improve right fine motor coordination required for ADL completion by completing 9 hole peg test in less than 1'.    Time 4   Period Weeks           OT Long Term Goals - 10/25/16 1435      OT LONG TERM GOAL #1   Title Pt will return to highest level of functioning and independence in B/ADL tasks, completing tasks at supervision level or better.    Time 8   Period Weeks   Status On-going     OT LONG TERM GOAL #2   Title Pt will improve BUE strength to 4+/5 to improve ability to complete ADL tasks with greater independence.    Time 8   Period Weeks   Status On-going     OT LONG TERM GOAL #3   Title Pt will improve BUE grip strength by 12# and pinch strength by 4# to improve ability to hold and utilize items for light meal preparation.    Time 8   Period Weeks   Status Partially Met     OT LONG TERM GOAL #4   Title Pt will improve right fine motor coordination by completing 9 hole peg test in 45" or less.    Time 8   Period Weeks   Status On-going               Plan - 11/19/16 1509    Clinical Impression Statement A: Continued with strengthening this session, pt with notable improvement in BUE strength and activity tolerance. Continued with coordination task, pt with mod/max  difficulty using tweezers for task. Verbal cuing intermittantly for form and technique.    Plan P: Attempt coordination task with left  hand-pegboard with tweezers.    OT Home Exercise Plan 7/20: red theraputty for grip and pinch strengthening, fine motor coordination tasks; BUE strengthening and coordination   Consulted and Agree with Plan of Care Patient      Patient will benefit from skilled therapeutic intervention in order to improve the following deficits and impairments:  Decreased activity tolerance, Decreased strength, Impaired UE functional use, Decreased coordination  Visit Diagnosis: Muscle weakness (generalized)  Other symptoms and signs involving the musculoskeletal system  Other lack of coordination    Problem List Patient Active Problem List   Diagnosis Date Noted  . Controlled diabetes mellitus type 2 with complications ([place]) 96/75/9163  . Physical deconditioning   . Acute ischemic stroke ([place]) 08/20/2016  . Pressure injury of skin 08/17/2016  . Acute CVA (cerebrovascular accident) ([place]) 08/17/2016  . UTI (urinary tract infection) 08/16/2016  . Constipation 07/10/2016  . Dysphagia as late effect of cerebrovascular accident (CVA) 07/10/2016  . Loss of weight 07/10/2016  . Impaired glucose tolerance 06/05/2016  . Slurred speech   . Hypokalemia 04/13/2016  . Hyperglycemia 04/13/2016  . Essential hypertension 04/13/2016  . Hyperlipidemia 04/13/2016  . S/P right hip fracture 04/12/2016  . Hip fracture ([place]) 04/12/2016  . Fall   . Itching in the vaginal area 10/05/2014  . Chronic myelogenous leukemia (CML), BCR-ABL1-positive ([place])   . GERD (gastroesophageal reflux disease) 12/06/2013  . Leukocytosis 08/06/2013  . Atypical chest pain 08/06/2013  . Chronic diarrhea 08/05/2013  . Pessary maintenance 07/01/2013   Guadelupe Sabin, OTR/L  [phone] 11/19/2016, 4:04 PM  [facility] [address] [place], [place],  [address] Phone: [phone]   Fax:  [phone]  Name: April Page MRN: [id number] Date of Birth: [birth year]

## 2016-11-21 ENCOUNTER — Encounter (HOSPITAL_COMMUNITY): Payer: Self-pay | Admitting: Occupational Therapy

## 2016-11-21 ENCOUNTER — Ambulatory Visit (HOSPITAL_COMMUNITY): Payer: Medicare HMO | Admitting: Physical Therapy

## 2016-11-21 ENCOUNTER — Ambulatory Visit (INDEPENDENT_AMBULATORY_CARE_PROVIDER_SITE_OTHER): Payer: Medicare HMO | Admitting: *Deleted

## 2016-11-21 ENCOUNTER — Ambulatory Visit (HOSPITAL_COMMUNITY): Payer: Medicare HMO | Admitting: Occupational Therapy

## 2016-11-21 DIAGNOSIS — M6281 Muscle weakness (generalized): Secondary | ICD-10-CM

## 2016-11-21 DIAGNOSIS — R29898 Other symptoms and signs involving the musculoskeletal system: Secondary | ICD-10-CM

## 2016-11-21 DIAGNOSIS — R278 Other lack of coordination: Secondary | ICD-10-CM

## 2016-11-21 DIAGNOSIS — I639 Cerebral infarction, unspecified: Secondary | ICD-10-CM

## 2016-11-21 NOTE — Therapy (Signed)
[place], [place], [address] Phone: [phone]   Fax:  [phone]  Occupational Therapy Treatment  Patient Details  Name: April Page MRN: [id number] Date of Birth: June 20, 1945 Referring Provider: Dr. Redmond School  Encounter Date: 11/21/2016      OT End of Session - 11/21/16 1510    Visit Number 14   Number of Visits 16   Date for OT Re-Evaluation 11/26/16   Authorization Type [employer]   Authorization Time Period Before 19th visit   Authorization - Visit Number 14   Authorization - Number of Visits 19   OT Start Time 1343   OT Stop Time 1430   OT Time Calculation (min) 47 min   Activity Tolerance Patient tolerated treatment well   Behavior During Therapy [facility] for tasks assessed/performed      Past Medical History:  Diagnosis Date  . Arthritis    stiff knees  . Blood dyscrasia   . Cancer ([place])   . CVA (cerebral vascular accident) ([place])   . GERD (gastroesophageal reflux disease)   . H/O: CML (chronic myeloid leukemia)   . Hyperlipidemia   . Hyperlipidemia   . Hypertension   . Obesity   . Pessary maintenance 07/01/2013  . Stroke ([place])    x 3    Past Surgical History:  Procedure Laterality Date  . ABDOMINAL HYSTERECTOMY    . CHOLECYSTECTOMY    . COLONOSCOPY  06/09/2002   WUX:[id number] hemorrhoids; otherwise normal rectum, colon   . COLONOSCOPY N/A 08/25/2013   VOZ:DGUYQIH diverticulosis. Single colonic polyp-removed  s/p segmental biopsy and stool sample. random colon bx negative. +benign leiomyoma.  . cystocele/rectocele repair  2009  . ESOPHAGOGASTRODUODENOSCOPY  06/09/2002   KVQ:[id number] upper gastrointestinal tract s/p  54-French Maloney dilator  . FEMUR IM NAIL Right 04/12/2016   Procedure: INTRAMEDULLARY (IM) NAIL FEMORAL;  Surgeon: Nicholes Stairs, MD;  Location: [facility];  Service: Orthopedics;  Laterality: Right;  . HERNIA REPAIR    . LOOP RECORDER INSERTION N/A 08/23/2016    Procedure: Loop Recorder Insertion;  Surgeon: Constance Haw, MD;  Location: [facility];  Service: Cardiovascular;  Laterality: N/A;  . OOPHORECTOMY    . TEE WITHOUT CARDIOVERSION N/A 08/23/2016   Procedure: TRANSESOPHAGEAL ECHOCARDIOGRAM (TEE);  Surgeon: Fay Records, MD;  Location: [facility];  Service: Cardiovascular;  Laterality: N/A;    There were no vitals filed for this visit.      Subjective Assessment - 11/21/16 1342    Subjective  S:    Currently in Pain? No/denies            [facility] OT Assessment - 11/21/16 1342      Assessment   Diagnosis s/p CVA     Precautions   Precautions Fall                  OT Treatments/Exercises (OP) - 11/21/16 1348      Exercises   Exercises Shoulder;Hand;Elbow     Shoulder Exercises: Seated   Protraction Strengthening;15 reps   Protraction Weight (lbs) 2   Horizontal ABduction AROM;15 reps   Flexion Strengthening;15 reps   Flexion Weight (lbs) 2   Abduction AROM;15 reps     Shoulder Exercises: ROM/Strengthening   X to V Arms 10X     Elbow Exercises   Elbow Extension Strengthening;15 reps   Bar Weights/Barbell (Elbow Extension) 1 lb   Other elbow exercises elbow flexion, 15X, 1# weight  Hand Exercises   Sponges Pt used red clothespin to stack sponges 5 high. Pt stacked one stack with each hand, minimal difficulty, left hand coordination greater than right. Pt then used green clothespin to grasp and place sponges into bucket using lateral and 3 point pinch. 10 sponges with each hand, min difficulty with left hand, min/mod difficulty with right   Small Pegboard Pt completed pegboard task using left hand and tweezers this session. Pt with mod difficulty with task, increased time required to complete, pt placed 10 pegs. Pt then placed pegs with right hand and tweezers, placing 10 pegs. Pt able to place in less time than prevoius session. Pt with greater ease when using left hand compared to right hand.       Fine Motor Coordination   Fine Motor Coordination Manipulating coins   Manipulating coins Pt held 5, 8, and 10 coins in palm practicing palm to fingertip translation and dropping into container. Pt with improved coordination this session, dropping only 4 coins on tabletop and approximately 3-4 on top of the container.                   OT Short Term Goals - 10/29/16 1547      OT SHORT TERM GOAL #1   Title Pt will be provided with and educated on HEP for improved independence during ADL completion.    Time 4   Period Weeks   Status On-going     OT SHORT TERM GOAL #2   Title Pt will improve independence in ADL completion by decreasing level of assistance required during bathing tasks from mod/max to minimal.    Time 4   Period Weeks   Status On-going     OT SHORT TERM GOAL #3   Title Pt will improve BUE grip strength by 6# and pinch strength by 2# to improve ability to grasp and hold grooming items.    Time 4   Period Weeks   Status Partially Met     OT SHORT TERM GOAL #4   Title Pt will improve right fine motor coordination required for ADL completion by completing 9 hole peg test in less than 1'.    Time 4   Period Weeks           OT Long Term Goals - 10/25/16 1435      OT LONG TERM GOAL #1   Title Pt will return to highest level of functioning and independence in B/ADL tasks, completing tasks at supervision level or better.    Time 8   Period Weeks   Status On-going     OT LONG TERM GOAL #2   Title Pt will improve BUE strength to 4+/5 to improve ability to complete ADL tasks with greater independence.    Time 8   Period Weeks   Status On-going     OT LONG TERM GOAL #3   Title Pt will improve BUE grip strength by 12# and pinch strength by 4# to improve ability to hold and utilize items for light meal preparation.    Time 8   Period Weeks   Status Partially Met     OT LONG TERM GOAL #4   Title Pt will improve right fine motor coordination by  completing 9 hole peg test in 45" or less.    Time 8   Period Weeks   Status On-going               Plan - 11/21/16  1511    Clinical Impression Statement A: Continued with coordination tasks, pinch strengthening, and BUE strengthening this session. Pt with improved coordination during pegboard activity, continues to have greater coordinatio with left hand, however notable decrease in time required for right hand coordination task today compared to prior session. Increased BUE strengthening repetitions to 15, one rest break required.    Plan P: Reassess and determine if ready for discharge.    OT Home Exercise Plan 7/20: red theraputty for grip and pinch strengthening, fine motor coordination tasks; BUE strengthening and coordination   Consulted and Agree with Plan of Care Patient      Patient will benefit from skilled therapeutic intervention in order to improve the following deficits and impairments:  Decreased activity tolerance, Decreased strength, Impaired UE functional use, Decreased coordination  Visit Diagnosis: Muscle weakness (generalized)  Other symptoms and signs involving the musculoskeletal system  Other lack of coordination    Problem List Patient Active Problem List   Diagnosis Date Noted  . Controlled diabetes mellitus type 2 with complications ([place]) 63/87/5643  . Physical deconditioning   . Acute ischemic stroke ([place]) 08/20/2016  . Pressure injury of skin 08/17/2016  . Acute CVA (cerebrovascular accident) ([place]) 08/17/2016  . UTI (urinary tract infection) 08/16/2016  . Constipation 07/10/2016  . Dysphagia as late effect of cerebrovascular accident (CVA) 07/10/2016  . Loss of weight 07/10/2016  . Impaired glucose tolerance 06/05/2016  . Slurred speech   . Hypokalemia 04/13/2016  . Hyperglycemia 04/13/2016  . Essential hypertension 04/13/2016  . Hyperlipidemia 04/13/2016  . S/P right hip fracture 04/12/2016  . Hip fracture ([place]) 04/12/2016  . Fall    . Itching in the vaginal area 10/05/2014  . Chronic myelogenous leukemia (CML), BCR-ABL1-positive ([place])   . GERD (gastroesophageal reflux disease) 12/06/2013  . Leukocytosis 08/06/2013  . Atypical chest pain 08/06/2013  . Chronic diarrhea 08/05/2013  . Pessary maintenance 07/01/2013   Guadelupe Sabin, OTR/L  [phone] 11/21/2016, 3:13 PM  [facility] [address] [place], [place], [address] Phone: [phone]   Fax:  [phone]  Name: April Page MRN: [id number] Date of Birth: 09/09/1945

## 2016-11-22 NOTE — Progress Notes (Signed)
Carelink Summary Report / Loop Recorder 

## 2016-11-26 ENCOUNTER — Ambulatory Visit (HOSPITAL_COMMUNITY): Payer: Medicare HMO

## 2016-11-26 ENCOUNTER — Encounter (HOSPITAL_COMMUNITY): Payer: Self-pay

## 2016-11-26 DIAGNOSIS — M6281 Muscle weakness (generalized): Secondary | ICD-10-CM

## 2016-11-26 DIAGNOSIS — R29898 Other symptoms and signs involving the musculoskeletal system: Secondary | ICD-10-CM | POA: Diagnosis not present

## 2016-11-26 DIAGNOSIS — R278 Other lack of coordination: Secondary | ICD-10-CM

## 2016-11-26 LAB — CUP PACEART REMOTE DEVICE CHECK
Date Time Interrogation Session: 20180913221013
Implantable Pulse Generator Implant Date: 20180615

## 2016-11-27 NOTE — Therapy (Signed)
[facility] [place], [place], [address] Phone: [phone]   Fax:  [phone]  Occupational Therapy Treatment  Patient Details  Name: April Page MRN: [id number] Date of Birth: [birth year] Referring Provider: Dr. Redmond School  Encounter Date: 11/26/2016      OT End of Session - 11/27/16 0835    Visit Number 15   Number of Visits 16   Authorization Type [employer]   Authorization Time Period Before 19th visit   Authorization - Visit Number 15   Authorization - Number of Visits 19   OT Start Time 1350   OT Stop Time 1430   OT Time Calculation (min) 40 min   Activity Tolerance Patient tolerated treatment well   Behavior During Therapy [facility] for tasks assessed/performed      Past Medical History:  Diagnosis Date  . Arthritis    stiff knees  . Blood dyscrasia   . Cancer ([place])   . CVA (cerebral vascular accident) ([place])   . GERD (gastroesophageal reflux disease)   . H/O: CML (chronic myeloid leukemia)   . Hyperlipidemia   . Hyperlipidemia   . Hypertension   . Obesity   . Pessary maintenance 07/01/2013  . Stroke ([place])    x 3    Past Surgical History:  Procedure Laterality Date  . ABDOMINAL HYSTERECTOMY    . CHOLECYSTECTOMY    . COLONOSCOPY  06/09/2002   NOM:VEHMCNOB hemorrhoids; otherwise normal rectum, colon   . COLONOSCOPY N/A 08/25/2013   SJG:GEZMOQH diverticulosis. Single colonic polyp-removed  s/p segmental biopsy and stool sample. random colon bx negative. +benign leiomyoma.  . cystocele/rectocele repair  2009  . ESOPHAGOGASTRODUODENOSCOPY  06/09/2002   UTM:LYYTKP upper gastrointestinal tract s/p  54-French Maloney dilator  . FEMUR IM NAIL Right 04/12/2016   Procedure: INTRAMEDULLARY (IM) NAIL FEMORAL;  Surgeon: Nicholes Stairs, MD;  Location: [facility];  Service: Orthopedics;  Laterality: Right;  . HERNIA REPAIR    . LOOP RECORDER INSERTION N/A 08/23/2016   Procedure: Loop Recorder Insertion;   Surgeon: Constance Haw, MD;  Location: [facility];  Service: Cardiovascular;  Laterality: N/A;  . OOPHORECTOMY    . TEE WITHOUT CARDIOVERSION N/A 08/23/2016   Procedure: TRANSESOPHAGEAL ECHOCARDIOGRAM (TEE);  Surgeon: Fay Records, MD;  Location: [facility];  Service: Cardiovascular;  Laterality: N/A;    There were no vitals filed for this visit.      Subjective Assessment - 11/26/16 1407    Subjective  S: One hand is always better than the other.   Currently in Pain? No/denies            [facility] OT Assessment - 11/26/16 1355      Assessment   Diagnosis s/p CVA     Precautions   Precautions Fall     Coordination   Right 9 Hole Peg Test 48 seconds  previous: 1'     Strength   Right/Left Shoulder Right;Left   Right Shoulder Flexion 4/5  previous: same   Right Shoulder ABduction 4-/5  previous: 4-/5   Right Shoulder Internal Rotation 4/5  previosu: 4-/5   Right Shoulder External Rotation 3/5  previous: 4-/5   Left Shoulder Flexion 4+/5  previous: 4/5   Left Shoulder ABduction 4/5  previous: same   Left Shoulder Internal Rotation 4+/5  previous: 4/5   Left Shoulder External Rotation 4/5  previous: 4-/5   Right/Left Elbow Right;Left   Right Elbow Flexion 5/5  previous: 4/5  Right Elbow Extension 5/5  previous: 4-/5   Left Elbow Flexion 5/5  previous: 4/5   Left Elbow Extension 5/5  previous: 4/5   Right/Left hand Right   Right Hand Grip (lbs) 35  previous: 39   Right Hand Lateral Pinch 12 lbs  previous: 11   Right Hand 3 Point Pinch 5 lbs  previous: same                  OT Treatments/Exercises (OP) - 11/26/16 0830      Exercises   Exercises Hand     Hand Exercises   Sponges Pt utilized both yellow and red resistive clothespins to increase 3 point pinch while stacking sponges and then transferring 30  sponges from table top to container. Patient was unable to use 3 point pinch continously during task and switched to  lateral pinch.                OT Education - 11/27/16 [phone]    Education provided Yes   Education Details Reviewed progress in therapy and goals met. Reviwed HEP and made recommendation for which exercises to continue at home independently.    Person(s) Educated Patient   Methods Explanation   Comprehension Verbalized understanding          OT Short Term Goals - 11/26/16 1407      OT SHORT TERM GOAL #1   Title Pt will be provided with and educated on HEP for improved independence during ADL completion.    Time 4   Period Weeks   Status Achieved     OT SHORT TERM GOAL #2   Title Pt will improve independence in ADL completion by decreasing level of assistance required during bathing tasks from mod/max to minimal.    Time 4   Period Weeks   Status Achieved     OT SHORT TERM GOAL #3   Title Pt will improve BUE grip strength by 6# and pinch strength by 2# to improve ability to grasp and hold grooming items.    Time 4   Period Weeks   Status Achieved     OT SHORT TERM GOAL #4   Title Pt will improve right fine motor coordination required for ADL completion by completing 9 hole peg test in less than 1'.    Time 4   Period Weeks           OT Long Term Goals - 11/26/16 1410      OT LONG TERM GOAL #1   Title Pt will return to highest level of functioning and independence in B/ADL tasks, completing tasks at supervision level or better.    Time 8   Period Weeks   Status Achieved     OT LONG TERM GOAL #2   Title Pt will improve BUE strength to 4+/5 to improve ability to complete ADL tasks with greater independence.    Time 8   Period Weeks   Status Partially Met     OT LONG TERM GOAL #3   Title Pt will improve BUE grip strength by 12# and pinch strength by 4# to improve ability to hold and utilize items for light meal preparation.    Time 8   Period Weeks   Status Achieved     OT LONG TERM GOAL #4   Title Pt will improve right fine motor coordination by  completing 9 hole peg test in 45" or less.    Time 8   Period Weeks  Status Partially Met               Plan - 11/27/16 0835    Clinical Impression Statement A: Reassessment completed this date. Patient has met all short term goals and 2/4 long term goals with last 2 remaining goals partially met. Reviewed HEP and recommended patient to continue exercises that were previously given. Patient reports that she is using her arms more and they feel stronger. She is not receivng physical assistance for ADL tasks as she was before therapy. She reports that her right hand is not as fast as her left. Therapist recommended D/C with HEP and patient  verbalized understanding.    Plan P: D/C from therapy with HEP.      Patient will benefit from skilled therapeutic intervention in order to improve the following deficits and impairments:  Decreased activity tolerance, Decreased strength, Impaired UE functional use, Decreased coordination  Visit Diagnosis: Muscle weakness (generalized)  Other symptoms and signs involving the musculoskeletal system  Other lack of coordination      G-Codes - 12-11-16 0840    Functional Assessment Tool Used (Outpatient only) clinical judgement   Functional Limitation Self care   Self Care Goal Status (K1224) At least 40 percent but less than 60 percent impaired, limited or restricted   Self Care Discharge Status [id number]) At least 20 percent but less than 40 percent impaired, limited or restricted      Problem List Patient Active Problem List   Diagnosis Date Noted  . Controlled diabetes mellitus type 2 with complications ([place]) 00/51/1021  . Physical deconditioning   . Acute ischemic stroke ([place]) 08/20/2016  . Pressure injury of skin 08/17/2016  . Acute CVA (cerebrovascular accident) ([place]) 08/17/2016  . UTI (urinary tract infection) 08/16/2016  . Constipation 07/10/2016  . Dysphagia as late effect of cerebrovascular accident (CVA) 07/10/2016  . Loss of  weight 07/10/2016  . Impaired glucose tolerance 06/05/2016  . Slurred speech   . Hypokalemia 04/13/2016  . Hyperglycemia 04/13/2016  . Essential hypertension 04/13/2016  . Hyperlipidemia 04/13/2016  . S/P right hip fracture 04/12/2016  . Hip fracture ([place]) 04/12/2016  . Fall   . Itching in the vaginal area 10/05/2014  . Chronic myelogenous leukemia (CML), BCR-ABL1-positive ([place])   . GERD (gastroesophageal reflux disease) 12/06/2013  . Leukocytosis 08/06/2013  . Atypical chest pain 08/06/2013  . Chronic diarrhea 08/05/2013  . Pessary maintenance 07/01/2013   OCCUPATIONAL THERAPY DISCHARGE SUMMARY  Visits from Start of Care: 15  Current functional level related to goals / functional outcomes: See above   Remaining deficits: See above   Education / Equipment: See above Plan: Patient agrees to discharge.  Patient goals were met. Patient is being discharged due to meeting the stated rehab goals.  ?????        Ailene Ravel, OTR/L,CBIS  [phone]  11/27/2016, 8:42 AM  [facility] [address] [place], [place], [address] Phone: [phone]   Fax:  [phone]  Name: VESTER TITSWORTH MRN: [id number] Date of Birth: 27-Jan-1946

## 2016-11-28 ENCOUNTER — Encounter (HOSPITAL_COMMUNITY): Payer: Medicare HMO | Admitting: Occupational Therapy

## 2016-11-28 ENCOUNTER — Ambulatory Visit (HOSPITAL_COMMUNITY): Payer: Medicare HMO

## 2016-12-03 ENCOUNTER — Encounter (HOSPITAL_COMMUNITY): Payer: Medicare HMO | Admitting: Occupational Therapy

## 2016-12-03 ENCOUNTER — Ambulatory Visit (HOSPITAL_COMMUNITY): Payer: Medicare HMO | Admitting: Physical Therapy

## 2016-12-10 ENCOUNTER — Encounter (HOSPITAL_COMMUNITY): Payer: Self-pay | Admitting: Cardiology

## 2016-12-10 ENCOUNTER — Emergency Department (HOSPITAL_COMMUNITY)
Admission: EM | Admit: 2016-12-10 | Discharge: 2016-12-10 | Disposition: A | Discharge status: Home / Self Care | Payer: Medicare HMO | Attending: Emergency Medicine | Admitting: Emergency Medicine

## 2016-12-10 ENCOUNTER — Emergency Department (HOSPITAL_COMMUNITY): Payer: Medicare HMO

## 2016-12-10 DIAGNOSIS — M545 Low back pain, unspecified: Secondary | ICD-10-CM

## 2016-12-10 DIAGNOSIS — I10 Essential (primary) hypertension: Secondary | ICD-10-CM | POA: Insufficient documentation

## 2016-12-10 DIAGNOSIS — W19XXXA Unspecified fall, initial encounter: Secondary | ICD-10-CM

## 2016-12-10 DIAGNOSIS — S3992XA Unspecified injury of lower back, initial encounter: Secondary | ICD-10-CM | POA: Diagnosis not present

## 2016-12-10 DIAGNOSIS — E119 Type 2 diabetes mellitus without complications: Secondary | ICD-10-CM | POA: Insufficient documentation

## 2016-12-10 DIAGNOSIS — W101XXA Fall (on)(from) sidewalk curb, initial encounter: Secondary | ICD-10-CM | POA: Diagnosis not present

## 2016-12-10 DIAGNOSIS — Z7982 Long term (current) use of aspirin: Secondary | ICD-10-CM | POA: Insufficient documentation

## 2016-12-10 DIAGNOSIS — Z79899 Other long term (current) drug therapy: Secondary | ICD-10-CM | POA: Insufficient documentation

## 2016-12-10 DIAGNOSIS — S3993XA Unspecified injury of pelvis, initial encounter: Secondary | ICD-10-CM | POA: Diagnosis not present

## 2016-12-10 NOTE — ED Notes (Signed)
Denies any chest pain or SOB.

## 2016-12-10 NOTE — ED Triage Notes (Signed)
Fall 2 days ago.  Pt states she was using a walker and tripped on sidewalk.  C/o lower back pain.  Per caregiver pt also has right hip and bilateral ankle edema.  Recent hip surgery.

## 2016-12-10 NOTE — ED Provider Notes (Signed)
[facility] DEPT Provider Note   CSN: [id number] Arrival date & time: 12/10/16  0930     History   Chief Complaint Chief Complaint  Patient presents with  . Fall    HPI April Page is a 71 y.o. female.  Accidental trip and fall several days ago while getting out of the car and stepping up on the sidewalk. She now complains of pain in her lower back and bilateral hip area. No extremity swelling noted. No change in behavior or neurological deficits.      Past Medical History:  Diagnosis Date  . Arthritis    stiff knees  . Blood dyscrasia   . Cancer ([place])   . CVA (cerebral vascular accident) ([place])   . GERD (gastroesophageal reflux disease)   . H/O: CML (chronic myeloid leukemia)   . Hyperlipidemia   . Hyperlipidemia   . Hypertension   . Obesity   . Pessary maintenance 07/01/2013  . Stroke [facility])    x 3    Patient Active Problem List   Diagnosis Date Noted  . Controlled diabetes mellitus type 2 with complications ([place]) 09/01/7626  . Physical deconditioning   . Acute ischemic stroke ([place]) 08/20/2016  . Pressure injury of skin 08/17/2016  . Acute CVA (cerebrovascular accident) ([place]) 08/17/2016  . UTI (urinary tract infection) 08/16/2016  . Constipation 07/10/2016  . Dysphagia as late effect of cerebrovascular accident (CVA) 07/10/2016  . Loss of weight 07/10/2016  . Impaired glucose tolerance 06/05/2016  . Slurred speech   . Hypokalemia 04/13/2016  . Hyperglycemia 04/13/2016  . Essential hypertension 04/13/2016  . Hyperlipidemia 04/13/2016  . S/P right hip fracture 04/12/2016  . Hip fracture ([place]) 04/12/2016  . Fall   . Itching in the vaginal area 10/05/2014  . Chronic myelogenous leukemia (CML), BCR-ABL1-positive ([place])   . GERD (gastroesophageal reflux disease) 12/06/2013  . Leukocytosis 08/06/2013  . Atypical chest pain 08/06/2013  . Chronic diarrhea 08/05/2013  . Pessary maintenance 07/01/2013    Past Surgical History:  Procedure Laterality  Date  . ABDOMINAL HYSTERECTOMY    . CHOLECYSTECTOMY    . COLONOSCOPY  06/09/2002   BTD:VVOHYWVP hemorrhoids; otherwise normal rectum, colon   . COLONOSCOPY N/A 08/25/2013   XTG:GYIRSWN diverticulosis. Single colonic polyp-removed  s/p segmental biopsy and stool sample. random colon bx negative. +benign leiomyoma.  . cystocele/rectocele repair  2009  . ESOPHAGOGASTRODUODENOSCOPY  06/09/2002   IOE:VOJJKK upper gastrointestinal tract s/p  54-French Maloney dilator  . FEMUR IM NAIL Right 04/12/2016   Procedure: INTRAMEDULLARY (IM) NAIL FEMORAL;  Surgeon: Nicholes Stairs, MD;  Location: [facility];  Service: Orthopedics;  Laterality: Right;  . HERNIA REPAIR    . LOOP RECORDER INSERTION N/A 08/23/2016   Procedure: Loop Recorder Insertion;  Surgeon: Constance Haw, MD;  Location: [facility];  Service: Cardiovascular;  Laterality: N/A;  . OOPHORECTOMY    . TEE WITHOUT CARDIOVERSION N/A 08/23/2016   Procedure: TRANSESOPHAGEAL ECHOCARDIOGRAM (TEE);  Surgeon: Fay Records, MD;  Location: [facility];  Service: Cardiovascular;  Laterality: N/A;    OB History    Gravida Para Term Preterm AB Living   6 6       5    SAB TAB Ectopic Multiple Live Births                   Home Medications    Prior to Admission medications   Medication Sig Start Date End Date Taking? Authorizing Provider  acetaminophen (TYLENOL) 325 MG tablet Take  2 tablets (650 mg total) by mouth every 4 (four) hours as needed for mild pain (or temp > 37.5 C (99.5 F)). 08/25/16  Yes Barton Dubois, MD  aspirin 325 MG tablet Take 1 tablet (325 mg total) by mouth daily. 06/05/16  Yes Kathie Dike, MD  atorvastatin (LIPITOR) 40 MG tablet Take 1 tablet (40 mg total) by mouth daily at 6 PM. 08/18/16  Yes Samuella Cota, MD  clopidogrel (PLAVIX) 75 MG tablet Take 1 tablet (75 mg total) by mouth daily. 08/18/16  Yes Samuella Cota, MD  isopropyl alcohol 70 % external solution Apply topically daily.   Yes [provider]  losartan (COZAAR) 100 MG tablet Take 1 tablet (100 mg total) by mouth daily. Resume on 3/28 08/18/16  Yes Samuella Cota, MD  Multiple Vitamin (MULTIVITAMIN WITH MINERALS) TABS tablet Take 1 tablet by mouth daily.   Yes [provider]  polyethylene glycol (MIRALAX / GLYCOLAX) packet Take 17 g by mouth daily. 06/06/16  Yes TatShanon Brow, MD    Family History Family History  Problem Relation Age of Onset  . Anuerysm Mother        deceased age 31, brain anuerysm  . Early death Mother 19  . Heart disease Father   . Hypertension Sister   . Obesity Brother   . Hypertension Sister   . Arthritis Sister   . Other Son        cardiac arrest  . Heart disease Child 25       cardiac arrest  . Colon cancer Neg Hx     Social History Social History  Substance Use Topics  . Smoking status: Never Smoker  . Smokeless tobacco: Never Used  . Alcohol use No     Allergies   Patient has no known allergies.   Review of Systems Review of Systems  All other systems reviewed and are negative.    Physical Exam Updated Vital Signs BP (!) 173/86   Pulse 71   Temp 98.2 F (36.8 C) (Oral)   Resp 20   Ht 5\' 4"  (1.626 m)   Wt 70.3 kg (155 lb)   SpO2 100%   BMI 26.61 kg/m   Physical Exam  Constitutional: She is oriented to person, place, and time. She appears well-developed and well-nourished.  HENT:  Head: Normocephalic and atraumatic.  Eyes: Conjunctivae are normal.  Neck: Neck supple.  Cardiovascular: Normal rate and regular rhythm.   Pulmonary/Chest: Effort normal and breath sounds normal.  Abdominal: Soft. Bowel sounds are normal.  Musculoskeletal:  Minimal lower back and bilateral lateral hip tenderness  Neurological: She is alert and oriented to person, place, and time.  Min right sided weakness secondary to old CVA  Skin: Skin is warm and dry.  Psychiatric: She has a normal mood and affect. Her behavior is normal.  Nursing note and vitals  reviewed.    ED Treatments / Results  Labs (all labs ordered are listed, but only abnormal results are displayed) Labs Reviewed - No data to display  EKG  EKG Interpretation None       Radiology Dg Lumbar Spine Complete  Result Date: 12/10/2016 CLINICAL DATA:  Pain following fall EXAM: LUMBAR SPINE - COMPLETE 4+ VIEW COMPARISON:  None. FINDINGS: Frontal, lateral, spot lumbosacral lateral, and bilateral oblique views were obtained. There are 6 non-rib-bearing lumbar type vertebral bodies. There is no apparent fracture or spondylolisthesis. There is moderate disc space narrowing at L5-6 with slight disc space narrowing at  L6-S1. There is moderate disc space narrowing as well at T12-L1. There are anterior osteophytes in the visualized lower thoracic regions. There is facet osteoarthritic change at L5-6 and L6-S1 bilaterally. There is also facet osteoarthritic change at L3-4 and L4-5 on the right. IMPRESSION: Note 6 non-rib-bearing lumbar type vertebral bodies. Osteoarthritic change in the lower lumbar spine as well as at T12-L1. No fracture or spondylolisthesis. Electronically Signed   By: Lowella Grip III M.D.   On: 12/10/2016 12:08   Dg Pelvis 1-2 Views  Result Date: 12/10/2016 CLINICAL DATA:  Pain following fall EXAM: PELVIS - 1-2 VIEW COMPARISON:  CT right hip April 11, 2016 FINDINGS: There has been previous screw and nail fixation through a fracture of the intertrochanteric region on the right with healing. Alignment anatomic in this area. There is no acute fracture or dislocation. Bones are osteoporotic. There is moderate narrowing of both hip joints, slightly more severe on the right than on the left. There is bony overgrowth in both hip joints, more severe on the right than on the left. No erosive change. IMPRESSION: Osteoarthritic change in both hip joints. Postoperative change on the right. No acute fracture or dislocation. Bones osteoporotic. Electronically Signed   By: Lowella Grip III M.D.   On: 12/10/2016 12:09    Procedures Procedures (including critical care time)  Medications Ordered in ED Medications - No data to display   Initial Impression / Assessment and Plan / ED Course  I have reviewed the triage vital signs and the nursing notes.  Pertinent labs & imaging results that were available during my care of the patient were reviewed by me and considered in my medical decision making (see chart for details).     Patient is alert out any new neurological deficits. Plain films of lumbar spine and pelvis show no acute fracture. This was discussed with the patient and her family.  Final Clinical Impressions(s) / ED Diagnoses   Final diagnoses:  Fall, initial encounter  Acute midline low back pain without sciatica    New Prescriptions Discharge Medication List as of 12/10/2016  1:55 PM       Nat Christen, MD 12/10/16 1503

## 2016-12-10 NOTE — ED Notes (Signed)
Fell at home on Thursday getting out of car.  steppping up on sidewalk.  Here today with concerns of swelling to right thigh, ankles and feet.  Pt rates pain 5/10.

## 2016-12-10 NOTE — Discharge Instructions (Signed)
X-rays show no fractures. You will be sore for several days. Tylenol for pain.

## 2016-12-18 DIAGNOSIS — I69391 Dysphagia following cerebral infarction: Secondary | ICD-10-CM | POA: Diagnosis not present

## 2016-12-18 DIAGNOSIS — R471 Dysarthria and anarthria: Secondary | ICD-10-CM | POA: Diagnosis not present

## 2016-12-18 DIAGNOSIS — R270 Ataxia, unspecified: Secondary | ICD-10-CM | POA: Diagnosis not present

## 2016-12-18 DIAGNOSIS — I63332 Cerebral infarction due to thrombosis of left posterior cerebral artery: Secondary | ICD-10-CM | POA: Diagnosis not present

## 2016-12-23 ENCOUNTER — Ambulatory Visit (INDEPENDENT_AMBULATORY_CARE_PROVIDER_SITE_OTHER): Payer: Medicare HMO | Admitting: *Deleted

## 2016-12-23 DIAGNOSIS — I639 Cerebral infarction, unspecified: Secondary | ICD-10-CM

## 2016-12-23 NOTE — Progress Notes (Signed)
Carelink Summary Report / Loop Recorder 

## 2016-12-24 ENCOUNTER — Encounter (HOSPITAL_COMMUNITY): Payer: Self-pay | Admitting: Adult Health

## 2016-12-24 ENCOUNTER — Encounter (HOSPITAL_COMMUNITY): Payer: Medicare HMO | Attending: Adult Health | Admitting: Adult Health

## 2016-12-24 ENCOUNTER — Encounter (HOSPITAL_COMMUNITY): Payer: Medicare HMO

## 2016-12-24 VITALS — BP 146/74 | HR 90 | Temp 98.4°F | Resp 16 | Ht 64.0 in | Wt 160.5 lb

## 2016-12-24 DIAGNOSIS — R748 Abnormal levels of other serum enzymes: Secondary | ICD-10-CM | POA: Diagnosis not present

## 2016-12-24 DIAGNOSIS — E785 Hyperlipidemia, unspecified: Secondary | ICD-10-CM | POA: Insufficient documentation

## 2016-12-24 DIAGNOSIS — Z9221 Personal history of antineoplastic chemotherapy: Secondary | ICD-10-CM | POA: Insufficient documentation

## 2016-12-24 DIAGNOSIS — N182 Chronic kidney disease, stage 2 (mild): Secondary | ICD-10-CM | POA: Insufficient documentation

## 2016-12-24 DIAGNOSIS — Z7982 Long term (current) use of aspirin: Secondary | ICD-10-CM | POA: Diagnosis not present

## 2016-12-24 DIAGNOSIS — I129 Hypertensive chronic kidney disease with stage 1 through stage 4 chronic kidney disease, or unspecified chronic kidney disease: Secondary | ICD-10-CM | POA: Diagnosis not present

## 2016-12-24 DIAGNOSIS — Z79899 Other long term (current) drug therapy: Secondary | ICD-10-CM | POA: Diagnosis not present

## 2016-12-24 DIAGNOSIS — C9211 Chronic myeloid leukemia, BCR/ABL-positive, in remission: Secondary | ICD-10-CM | POA: Diagnosis not present

## 2016-12-24 DIAGNOSIS — Z8673 Personal history of transient ischemic attack (TIA), and cerebral infarction without residual deficits: Secondary | ICD-10-CM | POA: Diagnosis not present

## 2016-12-24 DIAGNOSIS — K219 Gastro-esophageal reflux disease without esophagitis: Secondary | ICD-10-CM | POA: Insufficient documentation

## 2016-12-24 LAB — CUP PACEART REMOTE DEVICE CHECK
Date Time Interrogation Session: 20181013224227
Implantable Pulse Generator Implant Date: 20180615

## 2016-12-24 LAB — CBC WITH DIFFERENTIAL/PLATELET
Basophils Absolute: 0 10*3/uL (ref 0.0–0.1)
Basophils Relative: 1 %
Eosinophils Absolute: 0.1 10*3/uL (ref 0.0–0.7)
Eosinophils Relative: 2 %
HEMATOCRIT: 40.2 % (ref 36.0–46.0)
Hemoglobin: 12.7 g/dL (ref 12.0–15.0)
LYMPHS ABS: 2.3 10*3/uL (ref 0.7–4.0)
LYMPHS PCT: 35 %
MCH: 27.7 pg (ref 26.0–34.0)
MCHC: 31.6 g/dL (ref 30.0–36.0)
MCV: 87.6 fL (ref 78.0–100.0)
MONO ABS: 0.4 10*3/uL (ref 0.1–1.0)
MONOS PCT: 7 %
NEUTROS ABS: 3.5 10*3/uL (ref 1.7–7.7)
Neutrophils Relative %: 55 %
Platelets: 273 10*3/uL (ref 150–400)
RBC: 4.59 MIL/uL (ref 3.87–5.11)
RDW: 14.4 % (ref 11.5–15.5)
WBC: 6.4 10*3/uL (ref 4.0–10.5)

## 2016-12-24 LAB — COMPREHENSIVE METABOLIC PANEL
ALK PHOS: 193 U/L — AB (ref 38–126)
ALT: 12 U/L — ABNORMAL LOW (ref 14–54)
ANION GAP: 9 (ref 5–15)
AST: 16 U/L (ref 15–41)
Albumin: 4 g/dL (ref 3.5–5.0)
BILIRUBIN TOTAL: 0.6 mg/dL (ref 0.3–1.2)
BUN: 21 mg/dL — ABNORMAL HIGH (ref 6–20)
CALCIUM: 9.8 mg/dL (ref 8.9–10.3)
CO2: 28 mmol/L (ref 22–32)
Chloride: 105 mmol/L (ref 101–111)
Creatinine, Ser: 1.07 mg/dL — ABNORMAL HIGH (ref 0.44–1.00)
GFR calc Af Amer: 59 mL/min — ABNORMAL LOW (ref >60)
GFR, EST NON AFRICAN AMERICAN: 51 mL/min — AB (ref >60)
Glucose, Bld: 118 mg/dL — ABNORMAL HIGH (ref 65–99)
POTASSIUM: 3.9 mmol/L (ref 3.5–5.1)
Sodium: 142 mmol/L (ref 135–145)
TOTAL PROTEIN: 7.2 g/dL (ref 6.5–8.1)

## 2016-12-24 LAB — MAGNESIUM: MAGNESIUM: 2.1 mg/dL (ref 1.7–2.4)

## 2016-12-24 LAB — PHOSPHORUS: Phosphorus: 3.5 mg/dL (ref 2.5–4.6)

## 2016-12-24 NOTE — Progress Notes (Signed)
[place], [place] [address]   CLINIC:  Medical Oncology/Hematology  PCP:  Redmond School, MD [address] [place] [address] [phone]   REASON FOR VISIT:  Follow-up for CML, BCR/ABL positive  CURRENT THERAPY: Tasigna daily (STOPPED by patient/family in 09/2016).    BRIEF ONCOLOGIC HISTORY:    Chronic myelogenous leukemia (CML), BCR-ABL1-positive ([place])   06/01/2014 Tumor Marker    Results for April, Page (MRN [id number]) as of 12/02/2014 09:30  06/01/2014 09:59 BCR ABL1 / ABL1 IS: 71.220 (H)       07/14/2014 Bone Marrow Biopsy    Bone Marrow, Aspirate,Biopsy, and Clot, right iliac - HYPERCELLULAR BONE MARROW WITH CHRONIC MYELOGENOUS LEUKEMIA. - SEE COMMENT. PERIPHERAL BLOOD: - CHRONIC MYELOGENOUS LEUKEMIA. Diagnosis Note Previous PCR analysis performed for bcr-abl1 is report      07/21/2014 - 08/04/2014 Chemotherapy    Tasigna 300 mg BID      08/04/2014 Miscellaneous    Ran out of 2 week sample secondary to issues with insurance approval.      08/15/2014 -  Chemotherapy    Tasigna 300 mg BID      09/17/2014 Tumor Marker    b3a2 transcript % 4.122       12/02/2014 Tumor Marker    b3a2 transcript: 0.046       03/31/2015 Tumor Marker    b3a2 transcript:  <0.001 %         03/31/2015 Remission    Complete molecular response, no detectable BCR/ABL using PCR assay      05/24/2016 Remission    NEGATIVE for the BCR-ABL1 e1a2 (p190), e13a2 (b2a2, p210) and e14a2 (b3a2, p210) fusion transcripts.        HISTORY OF PRESENT ILLNESS:  (From Dr. Donald Pore last note on 01/14/16)     INTERVAL HISTORY:  April Page 71 y.o. female returns to cancer center for follow-up for CML.   Here today with one of her caregivers.    Since her last visit to the cancer center, she and her family decided to stop Tasigna chemotherapy treatment for her CML.  Patient states, "My daughter saw something about it on TV and wanted me to quit  taking it, so I did."  April Page states that she has been feeling well since stopping therapy.  She is not very interested in discussing other chemo treatment right now.  Appetite 100%; energy levels 75%.  She did have an ED visit for a fall on 12/10/16, where she said she missed a step and she had some associated back pain. This pain has since resolved.  She is ambulating with a walker.  She is back home from SNF/Rehab and feels like she is doing very well and getting stronger.   Her weight is up ~9 lbs since 09/2016.  She has trouble swallowing pills at times since her stroke. Denies any problems swallowing solid foods or liquids.  Denies any recent fever/chills, night sweats, severe fatigue, abnormal bruising, or frank bleeding episodes (including blood in her stools, hematuria, vaginal bleeding, or nosebleeds).   Overall, she tells me that she feels well and is otherwise without complaints today.      REVIEW OF SYSTEMS:  Review of Systems  Constitutional: Negative.  Negative for chills, fatigue and fever.  HENT:   Positive for trouble swallowing. Negative for lump/mass and nosebleeds.   Eyes: Negative.   Respiratory: Negative.  Negative for cough and shortness of breath.   Cardiovascular: Negative.  Negative  for chest pain and leg swelling.  Gastrointestinal: Negative.  Negative for abdominal pain, blood in stool, constipation, diarrhea, nausea and vomiting.  Endocrine: Negative.   Genitourinary: Negative.  Negative for dysuria and hematuria.   Musculoskeletal: Negative.  Negative for arthralgias.  Skin: Negative.  Negative for rash.  Neurological: Negative.  Negative for dizziness and headaches.  Hematological: Negative.  Negative for adenopathy. Does not bruise/bleed easily.  Psychiatric/Behavioral: Negative.  Negative for depression and sleep disturbance. The patient is not nervous/anxious.      PAST MEDICAL/SURGICAL HISTORY:  Past Medical History:  Diagnosis Date  . Arthritis      stiff knees  . Blood dyscrasia   . Cancer ([facility])   . CVA (cerebral vascular accident) ([facility])   . GERD (gastroesophageal reflux disease)   . H/O: CML (chronic myeloid leukemia)   . Hyperlipidemia   . Hyperlipidemia   . Hypertension   . Obesity   . Pessary maintenance 07/01/2013  . Stroke ([place])    x 3   Past Surgical History:  Procedure Laterality Date  . ABDOMINAL HYSTERECTOMY    . CHOLECYSTECTOMY    . COLONOSCOPY  06/09/2002   DQQ:IWLNLGXQ hemorrhoids; otherwise normal rectum, colon   . COLONOSCOPY N/A 08/25/2013   JJH:ERDEYCX diverticulosis. Single colonic polyp-removed  s/p segmental biopsy and stool sample. random colon bx negative. +benign leiomyoma.  . cystocele/rectocele repair  2009  . ESOPHAGOGASTRODUODENOSCOPY  06/09/2002   KGY:JEHUDJ upper gastrointestinal tract s/p  54-French Maloney dilator  . FEMUR IM NAIL Right 04/12/2016   Procedure: INTRAMEDULLARY (IM) NAIL FEMORAL;  Surgeon: Nicholes Stairs, MD;  Location: [facility];  Service: Orthopedics;  Laterality: Right;  . HERNIA REPAIR    . LOOP RECORDER INSERTION N/A 08/23/2016   Procedure: Loop Recorder Insertion;  Surgeon: Constance Haw, MD;  Location: [facility];  Service: Cardiovascular;  Laterality: N/A;  . OOPHORECTOMY    . TEE WITHOUT CARDIOVERSION N/A 08/23/2016   Procedure: TRANSESOPHAGEAL ECHOCARDIOGRAM (TEE);  Surgeon: Fay Records, MD;  Location[facility] END[facility];  Service: Cardiovascular;  Laterality: N/A;     SOCIAL HISTORY:  Social History   Social History  . Marital status: Widowed    Spouse name: N/A  . Number of children: 6  . Years of education: associate   Occupational History  .  Retired   Social History Main Topics  . Smoking status: Never Smoker  . Smokeless tobacco: Never Used  . Alcohol use No  . Drug use: No  . Sexual activity: Not Currently    Birth control/ protection: Surgical     Comment: hyst   Other Topics Concern  . Not on file   Social History Narrative  .  No narrative on file    FAMILY HISTORY:  Family History  Problem Relation Age of Onset  . Anuerysm Mother        deceased age 6, brain anuerysm  . Early death Mother 38  . Heart disease Father   . Hypertension Sister   . Obesity Brother   . Hypertension Sister   . Arthritis Sister   . Other Son        cardiac arrest  . Heart disease Child 19       cardiac arrest  . Colon cancer Neg Hx     CURRENT MEDICATIONS:  Outpatient Encounter Prescriptions as of 12/24/2016  Medication Sig  . acetaminophen (TYLENOL) 325 MG tablet Take 2 tablets (650 mg total) by mouth every 4 (four) hours as needed  for mild pain (or temp > 37.5 C (99.5 F)).  Marland Kitchen aspirin 325 MG tablet Take 1 tablet (325 mg total) by mouth daily.  Marland Kitchen atorvastatin (LIPITOR) 40 MG tablet Take 1 tablet (40 mg total) by mouth daily at 6 PM.  . clopidogrel (PLAVIX) 75 MG tablet Take 1 tablet (75 mg total) by mouth daily.  Marland Kitchen isopropyl alcohol 70 % external solution Apply topically daily.  Marland Kitchen losartan (COZAAR) 100 MG tablet Take 1 tablet (100 mg total) by mouth daily. Resume on 3/28  . Multiple Vitamin (MULTIVITAMIN WITH MINERALS) TABS tablet Take 1 tablet by mouth daily.  . polyethylene glycol (MIRALAX / GLYCOLAX) packet Take 17 g by mouth daily.   No facility-administered encounter medications on file as of 12/24/2016.     ALLERGIES:  No Known Allergies   PHYSICAL EXAM:  ECOG Performance status: 1-2 - Symptomatic; requires occasional assistance   Vitals:   12/24/16 1110  BP: (!) 146/74  Pulse: 90  Resp: 16  Temp: 98.4 F (36.9 C)  SpO2: 100%   Filed Weights   12/24/16 1110  Weight: 160 lb 8 oz (72.8 kg)    Physical Exam  Constitutional: She is oriented to person, place, and time.  HENT:  Head: Normocephalic.  Mouth/Throat: Oropharynx is clear and moist. No oropharyngeal exudate.  Eyes: Pupils are equal, round, and reactive to light. Conjunctivae are normal. No scleral icterus.  Neck: Normal range of motion.  Neck supple.  Cardiovascular: Normal rate and regular rhythm.   Pulmonary/Chest: Breath sounds normal. No respiratory distress.  Abdominal: Soft. Bowel sounds are normal. There is no tenderness.  Musculoskeletal: She exhibits no edema.  Ambulates with walker s/p stroke   Lymphadenopathy:    She has no cervical adenopathy.  Neurological: She is alert and oriented to person, place, and time.  Skin: Skin is warm and dry. No rash noted.  Psychiatric: Memory and judgment normal.  Mildly flat affect; does smile appropriately at times during visit.   Nursing note and vitals reviewed.    LABORATORY DATA:  I have reviewed the labs as listed.  CBC    Component Value Date/Time   WBC 6.4 12/24/2016 0954   RBC 4.59 12/24/2016 0954   HGB 12.7 12/24/2016 0954   HCT 40.2 12/24/2016 0954   PLT 273 12/24/2016 0954   MCV 87.6 12/24/2016 0954   MCH 27.7 12/24/2016 0954   MCHC 31.6 12/24/2016 0954   RDW 14.4 12/24/2016 0954   LYMPHSABS 2.3 12/24/2016 0954   MONOABS 0.4 12/24/2016 0954   EOSABS 0.1 12/24/2016 0954   BASOSABS 0.0 12/24/2016 0954   CMP Latest Ref Rng & Units 12/24/2016 09/23/2016 09/02/2016  Glucose 65 - 99 mg/dL 118(H) 120(H) 106(H)  BUN 6 - 20 mg/dL 21(H) 20 18  Creatinine 0.44 - 1.00 mg/dL 1.07(H) 1.07(H) 1.00  Sodium 135 - 145 mmol/L 142 144 141  Potassium 3.5 - 5.1 mmol/L 3.9 3.7 4.1  Chloride 101 - 111 mmol/L 105 107 107  CO2 22 - 32 mmol/L '28 28 27  ' Calcium 8.9 - 10.3 mg/dL 9.8 10.7(H) 9.6  Total Protein 6.5 - 8.1 g/dL 7.2 7.6 -  Total Bilirubin 0.3 - 1.2 mg/dL 0.6 0.8 -  Alkaline Phos 38 - 126 U/L 193(H) 107 -  AST 15 - 41 U/L 16 25 -  ALT 14 - 54 U/L 12(L) 16 -   Results for April, Page (MRN [id number])   Ref. Range 06/01/2014 09:59  BCR ABL1 / ABL1 IS Latest Units: %  71.220 (H)         PENDING LABS:  BCR-ABL pending   DIAGNOSTIC IMAGING:    PATHOLOGY:  Bone marrow biopsy: 07/14/14         ASSESSMENT & PLAN:   CML, BCR/ALB positive, in  remission:  -Noted complete molecular response with BCR/ABL negative in 09/2016. She elected to stop treatment with Nilotinib (Tasigna) around that time.  BCR/ABL results pending for today.  -CBC normal today, which is encouraging. Mild CKD noted; encouraged her to increase her non-caffeinated fluid intake as tolerated.  -We discussed that there are other treatment options for CML. However, she is not interested in pursuing any additional chemotherapy at this time. "I don't want any chemo right now."  Shared with her that we can continue to monitor her blood work and if we notice significant changes over time, then we can revisit the idea of resuming some type of treatment for her CML.  She is agreeable with this plan.   -Return to cancer center in 3 months for follow-up with labs.   Mildly elevated alkaline phosphatase:  -Alk phos 193 today.  Was normal in 09/2016.  Bilirubin normal today and AST/ALT largely normal/stable as well.   -Will continue to monitor with labs in 3 months.   Health maintenance/Wellness:  She declines flu shot today stating, "The last time I took that I was the sickest I've ever been in my life."  Reviewed important infection prevention precautions like avoiding sick contacts, frequent handwashing, etc.   -Encouraged continued healthy diet and exercise as tolerated. Caregiver notes that they work on her physical activities and strength exercises often.  Commended their efforts and encouraged them to keep up the good work.  -Patient states she is up-to-date with her mammogram. Encouraged continued follow-up with PCP for other age/gender appropriate cancer screenings as they deem clinically appropriate.       Dispo:  -Return to cancer center in 3 months with labs.    All questions were answered to patient's stated satisfaction. Encouraged patient to call with any new concerns or questions before her next visit to the cancer center and we can certain see her sooner, if  needed.      Orders placed this encounter:  Orders Placed This Encounter  Procedures  . CBC with Differential/Platelet  . Comprehensive metabolic panel  . BCR-ABL1, CML/ALL, PCR, [place], NP [place] [phone]

## 2016-12-24 NOTE — Patient Instructions (Signed)
[place] at [facility] Discharge Instructions  RECOMMENDATIONS MADE BY THE CONSULTANT AND ANY TEST RESULTS WILL BE SENT TO YOUR REFERRING PHYSICIAN.  You were seen today by Mike Craze NP. Return in 3 months for labs and follow up.   Thank you for choosing [place] at [facility] to provide your oncology and hematology care.  To afford each patient quality time with our provider, please arrive at least 15 minutes before your scheduled appointment time.    If you have a lab appointment with the [facility] please come in thru the  Main Entrance and check in at the main information desk  You need to re-schedule your appointment should you arrive 10 or more minutes late.  We strive to give you quality time with our providers, and arriving late affects you and other patients whose appointments are after yours.  Also, if you no show three or more times for appointments you may be dismissed from the clinic at the providers discretion.     Again, thank you for choosing [facility].  Our hope is that these requests will decrease the amount of time that you wait before being seen by our physicians.       _____________________________________________________________  Should you have questions after your visit to [facility], please contact our office at [phone] between the hours of 8:30 a.m. and 4:30 p.m.  Voicemails left after 4:30 p.m. will not be returned until the following business day.  For prescription refill requests, have your pharmacy contact our office.       Resources For Cancer Patients and their Caregivers ? American Cancer Society: Can assist with transportation, wigs, general needs, runs Look Good Feel Better.        586 733 9574 ? Cancer Care: Provides financial assistance, online support groups, medication/co-pay assistance.  1-800-813-HOPE 334-121-9129) ? [facility] Assists [place] cancer patients and their families through emotional , educational and financial support.  620-422-0850 ? [place] DSS Where to apply for food stamps, Medicaid and utility assistance. 201-638-3529 ? [employer]: Transportation to medical appointments. 838-156-9349 ? Social Security Administration: May apply for disability if have a Stage IV cancer. (613)318-6872 517-546-7647 ? [employer], Disability and Transit Services: Assists with nutrition, care and transit needs. [place] Support Programs: @10RELATIVEDAYS @ > Cancer Support Group  2nd Tuesday of the month 1pm-2pm, Journey Room  > Creative Journey  3rd Tuesday of the month 1130am-1pm, Journey Room  > Look Good Feel Better  1st Wednesday of the month 10am-12 noon, Journey Room (Call [place] to register [phone])

## 2016-12-31 LAB — BCR-ABL1, CML/ALL, PCR, QUANT
B2A2 TRANSCRIPT: 0.0547 %
B3A2 TRANSCRIPT: 0.786 %

## 2017-01-14 DIAGNOSIS — R627 Adult failure to thrive: Secondary | ICD-10-CM | POA: Diagnosis not present

## 2017-01-14 DIAGNOSIS — R6251 Failure to thrive (child): Secondary | ICD-10-CM | POA: Diagnosis not present

## 2017-01-14 DIAGNOSIS — I10 Essential (primary) hypertension: Secondary | ICD-10-CM | POA: Diagnosis not present

## 2017-01-14 DIAGNOSIS — E119 Type 2 diabetes mellitus without complications: Secondary | ICD-10-CM | POA: Diagnosis not present

## 2017-01-14 DIAGNOSIS — R531 Weakness: Secondary | ICD-10-CM | POA: Diagnosis not present

## 2017-01-14 DIAGNOSIS — Z0000 Encounter for general adult medical examination without abnormal findings: Secondary | ICD-10-CM | POA: Diagnosis not present

## 2017-01-14 DIAGNOSIS — Z6827 Body mass index (BMI) 27.0-27.9, adult: Secondary | ICD-10-CM | POA: Diagnosis not present

## 2017-01-14 DIAGNOSIS — Z1389 Encounter for screening for other disorder: Secondary | ICD-10-CM | POA: Diagnosis not present

## 2017-01-20 ENCOUNTER — Ambulatory Visit (INDEPENDENT_AMBULATORY_CARE_PROVIDER_SITE_OTHER): Payer: Medicare HMO | Admitting: *Deleted

## 2017-01-20 ENCOUNTER — Telehealth: Payer: Self-pay | Admitting: Cardiology

## 2017-01-20 ENCOUNTER — Telehealth (HOSPITAL_COMMUNITY): Payer: Self-pay | Admitting: Internal Medicine

## 2017-01-20 DIAGNOSIS — I639 Cerebral infarction, unspecified: Secondary | ICD-10-CM

## 2017-01-20 NOTE — Telephone Encounter (Signed)
Received an e-mail stating that patient called about her remote appt scheduled for today. Called pt and informed her that she does not have to do anything that the home monitor by her bed will send the information. Pt verbalized understanding.

## 2017-01-20 NOTE — Telephone Encounter (Signed)
01/20/17  A lady called for April Page... Said that today's appt needed to be cancelled because it was inconvenient for the patient to come today and no one called to remind them of this appointment.  They didn't have a phone number to call the office to cancel so they called our office, [facility], to let someone know to cancel and to call them to reschedule.  I sent an email to [facility] since it look like she scheduled the appt.

## 2017-01-21 DIAGNOSIS — C9200 Acute myeloblastic leukemia, not having achieved remission: Secondary | ICD-10-CM | POA: Diagnosis not present

## 2017-01-21 DIAGNOSIS — G3184 Mild cognitive impairment, so stated: Secondary | ICD-10-CM | POA: Diagnosis not present

## 2017-01-21 DIAGNOSIS — E119 Type 2 diabetes mellitus without complications: Secondary | ICD-10-CM | POA: Diagnosis not present

## 2017-01-21 DIAGNOSIS — I69351 Hemiplegia and hemiparesis following cerebral infarction affecting right dominant side: Secondary | ICD-10-CM | POA: Diagnosis not present

## 2017-01-21 DIAGNOSIS — I10 Essential (primary) hypertension: Secondary | ICD-10-CM | POA: Diagnosis not present

## 2017-01-21 DIAGNOSIS — R627 Adult failure to thrive: Secondary | ICD-10-CM | POA: Diagnosis not present

## 2017-01-21 NOTE — Progress Notes (Signed)
Carelink Summary Report / Loop Recorder 

## 2017-01-22 DIAGNOSIS — R627 Adult failure to thrive: Secondary | ICD-10-CM | POA: Diagnosis not present

## 2017-01-22 DIAGNOSIS — G3184 Mild cognitive impairment, so stated: Secondary | ICD-10-CM | POA: Diagnosis not present

## 2017-01-22 DIAGNOSIS — I10 Essential (primary) hypertension: Secondary | ICD-10-CM | POA: Diagnosis not present

## 2017-01-22 DIAGNOSIS — E119 Type 2 diabetes mellitus without complications: Secondary | ICD-10-CM | POA: Diagnosis not present

## 2017-01-22 DIAGNOSIS — C9200 Acute myeloblastic leukemia, not having achieved remission: Secondary | ICD-10-CM | POA: Diagnosis not present

## 2017-01-22 DIAGNOSIS — I69351 Hemiplegia and hemiparesis following cerebral infarction affecting right dominant side: Secondary | ICD-10-CM | POA: Diagnosis not present

## 2017-01-24 DIAGNOSIS — R627 Adult failure to thrive: Secondary | ICD-10-CM | POA: Diagnosis not present

## 2017-01-24 DIAGNOSIS — G3184 Mild cognitive impairment, so stated: Secondary | ICD-10-CM | POA: Diagnosis not present

## 2017-01-24 DIAGNOSIS — E119 Type 2 diabetes mellitus without complications: Secondary | ICD-10-CM | POA: Diagnosis not present

## 2017-01-24 DIAGNOSIS — I69351 Hemiplegia and hemiparesis following cerebral infarction affecting right dominant side: Secondary | ICD-10-CM | POA: Diagnosis not present

## 2017-01-24 DIAGNOSIS — I10 Essential (primary) hypertension: Secondary | ICD-10-CM | POA: Diagnosis not present

## 2017-01-24 DIAGNOSIS — C9200 Acute myeloblastic leukemia, not having achieved remission: Secondary | ICD-10-CM | POA: Diagnosis not present

## 2017-01-27 DIAGNOSIS — I10 Essential (primary) hypertension: Secondary | ICD-10-CM | POA: Diagnosis not present

## 2017-01-27 DIAGNOSIS — G3184 Mild cognitive impairment, so stated: Secondary | ICD-10-CM | POA: Diagnosis not present

## 2017-01-27 DIAGNOSIS — R627 Adult failure to thrive: Secondary | ICD-10-CM | POA: Diagnosis not present

## 2017-01-27 DIAGNOSIS — E119 Type 2 diabetes mellitus without complications: Secondary | ICD-10-CM | POA: Diagnosis not present

## 2017-01-27 DIAGNOSIS — C9200 Acute myeloblastic leukemia, not having achieved remission: Secondary | ICD-10-CM | POA: Diagnosis not present

## 2017-01-27 DIAGNOSIS — I69351 Hemiplegia and hemiparesis following cerebral infarction affecting right dominant side: Secondary | ICD-10-CM | POA: Diagnosis not present

## 2017-01-27 LAB — CUP PACEART REMOTE DEVICE CHECK
Date Time Interrogation Session: 20181112230845
MDC IDC PG IMPLANT DT: 20180615

## 2017-01-28 DIAGNOSIS — G3184 Mild cognitive impairment, so stated: Secondary | ICD-10-CM | POA: Diagnosis not present

## 2017-01-28 DIAGNOSIS — C9200 Acute myeloblastic leukemia, not having achieved remission: Secondary | ICD-10-CM | POA: Diagnosis not present

## 2017-01-28 DIAGNOSIS — I69351 Hemiplegia and hemiparesis following cerebral infarction affecting right dominant side: Secondary | ICD-10-CM | POA: Diagnosis not present

## 2017-01-28 DIAGNOSIS — E119 Type 2 diabetes mellitus without complications: Secondary | ICD-10-CM | POA: Diagnosis not present

## 2017-01-28 DIAGNOSIS — I10 Essential (primary) hypertension: Secondary | ICD-10-CM | POA: Diagnosis not present

## 2017-01-28 DIAGNOSIS — R627 Adult failure to thrive: Secondary | ICD-10-CM | POA: Diagnosis not present

## 2017-01-29 DIAGNOSIS — R627 Adult failure to thrive: Secondary | ICD-10-CM | POA: Diagnosis not present

## 2017-01-29 DIAGNOSIS — I10 Essential (primary) hypertension: Secondary | ICD-10-CM | POA: Diagnosis not present

## 2017-01-29 DIAGNOSIS — I69351 Hemiplegia and hemiparesis following cerebral infarction affecting right dominant side: Secondary | ICD-10-CM | POA: Diagnosis not present

## 2017-01-29 DIAGNOSIS — C9200 Acute myeloblastic leukemia, not having achieved remission: Secondary | ICD-10-CM | POA: Diagnosis not present

## 2017-01-29 DIAGNOSIS — G3184 Mild cognitive impairment, so stated: Secondary | ICD-10-CM | POA: Diagnosis not present

## 2017-01-29 DIAGNOSIS — E119 Type 2 diabetes mellitus without complications: Secondary | ICD-10-CM | POA: Diagnosis not present

## 2017-02-03 DIAGNOSIS — E119 Type 2 diabetes mellitus without complications: Secondary | ICD-10-CM | POA: Diagnosis not present

## 2017-02-03 DIAGNOSIS — I10 Essential (primary) hypertension: Secondary | ICD-10-CM | POA: Diagnosis not present

## 2017-02-03 DIAGNOSIS — I69351 Hemiplegia and hemiparesis following cerebral infarction affecting right dominant side: Secondary | ICD-10-CM | POA: Diagnosis not present

## 2017-02-03 DIAGNOSIS — C9200 Acute myeloblastic leukemia, not having achieved remission: Secondary | ICD-10-CM | POA: Diagnosis not present

## 2017-02-03 DIAGNOSIS — R627 Adult failure to thrive: Secondary | ICD-10-CM | POA: Diagnosis not present

## 2017-02-03 DIAGNOSIS — G3184 Mild cognitive impairment, so stated: Secondary | ICD-10-CM | POA: Diagnosis not present

## 2017-02-04 DIAGNOSIS — I69351 Hemiplegia and hemiparesis following cerebral infarction affecting right dominant side: Secondary | ICD-10-CM | POA: Diagnosis not present

## 2017-02-04 DIAGNOSIS — E119 Type 2 diabetes mellitus without complications: Secondary | ICD-10-CM | POA: Diagnosis not present

## 2017-02-04 DIAGNOSIS — R627 Adult failure to thrive: Secondary | ICD-10-CM | POA: Diagnosis not present

## 2017-02-04 DIAGNOSIS — C9200 Acute myeloblastic leukemia, not having achieved remission: Secondary | ICD-10-CM | POA: Diagnosis not present

## 2017-02-04 DIAGNOSIS — G3184 Mild cognitive impairment, so stated: Secondary | ICD-10-CM | POA: Diagnosis not present

## 2017-02-04 DIAGNOSIS — I10 Essential (primary) hypertension: Secondary | ICD-10-CM | POA: Diagnosis not present

## 2017-02-05 DIAGNOSIS — C9200 Acute myeloblastic leukemia, not having achieved remission: Secondary | ICD-10-CM | POA: Diagnosis not present

## 2017-02-05 DIAGNOSIS — I10 Essential (primary) hypertension: Secondary | ICD-10-CM | POA: Diagnosis not present

## 2017-02-05 DIAGNOSIS — I69351 Hemiplegia and hemiparesis following cerebral infarction affecting right dominant side: Secondary | ICD-10-CM | POA: Diagnosis not present

## 2017-02-05 DIAGNOSIS — G3184 Mild cognitive impairment, so stated: Secondary | ICD-10-CM | POA: Diagnosis not present

## 2017-02-05 DIAGNOSIS — R627 Adult failure to thrive: Secondary | ICD-10-CM | POA: Diagnosis not present

## 2017-02-05 DIAGNOSIS — E119 Type 2 diabetes mellitus without complications: Secondary | ICD-10-CM | POA: Diagnosis not present

## 2017-02-06 DIAGNOSIS — G3184 Mild cognitive impairment, so stated: Secondary | ICD-10-CM | POA: Diagnosis not present

## 2017-02-06 DIAGNOSIS — I10 Essential (primary) hypertension: Secondary | ICD-10-CM | POA: Diagnosis not present

## 2017-02-06 DIAGNOSIS — I69351 Hemiplegia and hemiparesis following cerebral infarction affecting right dominant side: Secondary | ICD-10-CM | POA: Diagnosis not present

## 2017-02-06 DIAGNOSIS — C9200 Acute myeloblastic leukemia, not having achieved remission: Secondary | ICD-10-CM | POA: Diagnosis not present

## 2017-02-06 DIAGNOSIS — E119 Type 2 diabetes mellitus without complications: Secondary | ICD-10-CM | POA: Diagnosis not present

## 2017-02-06 DIAGNOSIS — R627 Adult failure to thrive: Secondary | ICD-10-CM | POA: Diagnosis not present

## 2017-02-10 DIAGNOSIS — E119 Type 2 diabetes mellitus without complications: Secondary | ICD-10-CM | POA: Diagnosis not present

## 2017-02-10 DIAGNOSIS — R627 Adult failure to thrive: Secondary | ICD-10-CM | POA: Diagnosis not present

## 2017-02-10 DIAGNOSIS — G3184 Mild cognitive impairment, so stated: Secondary | ICD-10-CM | POA: Diagnosis not present

## 2017-02-10 DIAGNOSIS — I10 Essential (primary) hypertension: Secondary | ICD-10-CM | POA: Diagnosis not present

## 2017-02-10 DIAGNOSIS — C9200 Acute myeloblastic leukemia, not having achieved remission: Secondary | ICD-10-CM | POA: Diagnosis not present

## 2017-02-10 DIAGNOSIS — I69351 Hemiplegia and hemiparesis following cerebral infarction affecting right dominant side: Secondary | ICD-10-CM | POA: Diagnosis not present

## 2017-02-11 DIAGNOSIS — R627 Adult failure to thrive: Secondary | ICD-10-CM | POA: Diagnosis not present

## 2017-02-11 DIAGNOSIS — C9200 Acute myeloblastic leukemia, not having achieved remission: Secondary | ICD-10-CM | POA: Diagnosis not present

## 2017-02-11 DIAGNOSIS — E119 Type 2 diabetes mellitus without complications: Secondary | ICD-10-CM | POA: Diagnosis not present

## 2017-02-11 DIAGNOSIS — G3184 Mild cognitive impairment, so stated: Secondary | ICD-10-CM | POA: Diagnosis not present

## 2017-02-11 DIAGNOSIS — I10 Essential (primary) hypertension: Secondary | ICD-10-CM | POA: Diagnosis not present

## 2017-02-11 DIAGNOSIS — I69351 Hemiplegia and hemiparesis following cerebral infarction affecting right dominant side: Secondary | ICD-10-CM | POA: Diagnosis not present

## 2017-02-12 DIAGNOSIS — E119 Type 2 diabetes mellitus without complications: Secondary | ICD-10-CM | POA: Diagnosis not present

## 2017-02-12 DIAGNOSIS — R627 Adult failure to thrive: Secondary | ICD-10-CM | POA: Diagnosis not present

## 2017-02-12 DIAGNOSIS — G3184 Mild cognitive impairment, so stated: Secondary | ICD-10-CM | POA: Diagnosis not present

## 2017-02-12 DIAGNOSIS — I69351 Hemiplegia and hemiparesis following cerebral infarction affecting right dominant side: Secondary | ICD-10-CM | POA: Diagnosis not present

## 2017-02-12 DIAGNOSIS — I10 Essential (primary) hypertension: Secondary | ICD-10-CM | POA: Diagnosis not present

## 2017-02-12 DIAGNOSIS — C9200 Acute myeloblastic leukemia, not having achieved remission: Secondary | ICD-10-CM | POA: Diagnosis not present

## 2017-02-13 DIAGNOSIS — R627 Adult failure to thrive: Secondary | ICD-10-CM | POA: Diagnosis not present

## 2017-02-13 DIAGNOSIS — E119 Type 2 diabetes mellitus without complications: Secondary | ICD-10-CM | POA: Diagnosis not present

## 2017-02-13 DIAGNOSIS — I10 Essential (primary) hypertension: Secondary | ICD-10-CM | POA: Diagnosis not present

## 2017-02-13 DIAGNOSIS — G3184 Mild cognitive impairment, so stated: Secondary | ICD-10-CM | POA: Diagnosis not present

## 2017-02-13 DIAGNOSIS — C9200 Acute myeloblastic leukemia, not having achieved remission: Secondary | ICD-10-CM | POA: Diagnosis not present

## 2017-02-13 DIAGNOSIS — I69351 Hemiplegia and hemiparesis following cerebral infarction affecting right dominant side: Secondary | ICD-10-CM | POA: Diagnosis not present

## 2017-02-19 ENCOUNTER — Ambulatory Visit (INDEPENDENT_AMBULATORY_CARE_PROVIDER_SITE_OTHER): Payer: Medicare HMO | Admitting: *Deleted

## 2017-02-19 DIAGNOSIS — I69351 Hemiplegia and hemiparesis following cerebral infarction affecting right dominant side: Secondary | ICD-10-CM | POA: Diagnosis not present

## 2017-02-19 DIAGNOSIS — E119 Type 2 diabetes mellitus without complications: Secondary | ICD-10-CM | POA: Diagnosis not present

## 2017-02-19 DIAGNOSIS — R627 Adult failure to thrive: Secondary | ICD-10-CM | POA: Diagnosis not present

## 2017-02-19 DIAGNOSIS — G3184 Mild cognitive impairment, so stated: Secondary | ICD-10-CM | POA: Diagnosis not present

## 2017-02-19 DIAGNOSIS — I639 Cerebral infarction, unspecified: Secondary | ICD-10-CM | POA: Diagnosis not present

## 2017-02-19 DIAGNOSIS — C9200 Acute myeloblastic leukemia, not having achieved remission: Secondary | ICD-10-CM | POA: Diagnosis not present

## 2017-02-19 DIAGNOSIS — I10 Essential (primary) hypertension: Secondary | ICD-10-CM | POA: Diagnosis not present

## 2017-02-20 DIAGNOSIS — I69351 Hemiplegia and hemiparesis following cerebral infarction affecting right dominant side: Secondary | ICD-10-CM | POA: Diagnosis not present

## 2017-02-20 DIAGNOSIS — C9200 Acute myeloblastic leukemia, not having achieved remission: Secondary | ICD-10-CM | POA: Diagnosis not present

## 2017-02-20 DIAGNOSIS — E119 Type 2 diabetes mellitus without complications: Secondary | ICD-10-CM | POA: Diagnosis not present

## 2017-02-20 DIAGNOSIS — I10 Essential (primary) hypertension: Secondary | ICD-10-CM | POA: Diagnosis not present

## 2017-02-20 DIAGNOSIS — G3184 Mild cognitive impairment, so stated: Secondary | ICD-10-CM | POA: Diagnosis not present

## 2017-02-20 DIAGNOSIS — R627 Adult failure to thrive: Secondary | ICD-10-CM | POA: Diagnosis not present

## 2017-02-20 NOTE — Progress Notes (Signed)
Carelink Summary Report / Loop Recorder 

## 2017-02-24 DIAGNOSIS — G3184 Mild cognitive impairment, so stated: Secondary | ICD-10-CM | POA: Diagnosis not present

## 2017-02-24 DIAGNOSIS — R627 Adult failure to thrive: Secondary | ICD-10-CM | POA: Diagnosis not present

## 2017-02-24 DIAGNOSIS — I69351 Hemiplegia and hemiparesis following cerebral infarction affecting right dominant side: Secondary | ICD-10-CM | POA: Diagnosis not present

## 2017-02-24 DIAGNOSIS — C9200 Acute myeloblastic leukemia, not having achieved remission: Secondary | ICD-10-CM | POA: Diagnosis not present

## 2017-02-24 DIAGNOSIS — I10 Essential (primary) hypertension: Secondary | ICD-10-CM | POA: Diagnosis not present

## 2017-02-24 DIAGNOSIS — E119 Type 2 diabetes mellitus without complications: Secondary | ICD-10-CM | POA: Diagnosis not present

## 2017-02-25 DIAGNOSIS — C9200 Acute myeloblastic leukemia, not having achieved remission: Secondary | ICD-10-CM | POA: Diagnosis not present

## 2017-02-25 DIAGNOSIS — R627 Adult failure to thrive: Secondary | ICD-10-CM | POA: Diagnosis not present

## 2017-02-25 DIAGNOSIS — I69351 Hemiplegia and hemiparesis following cerebral infarction affecting right dominant side: Secondary | ICD-10-CM | POA: Diagnosis not present

## 2017-02-25 DIAGNOSIS — I10 Essential (primary) hypertension: Secondary | ICD-10-CM | POA: Diagnosis not present

## 2017-02-25 DIAGNOSIS — E119 Type 2 diabetes mellitus without complications: Secondary | ICD-10-CM | POA: Diagnosis not present

## 2017-02-25 DIAGNOSIS — G3184 Mild cognitive impairment, so stated: Secondary | ICD-10-CM | POA: Diagnosis not present

## 2017-02-26 DIAGNOSIS — I69351 Hemiplegia and hemiparesis following cerebral infarction affecting right dominant side: Secondary | ICD-10-CM | POA: Diagnosis not present

## 2017-02-26 DIAGNOSIS — I10 Essential (primary) hypertension: Secondary | ICD-10-CM | POA: Diagnosis not present

## 2017-02-26 DIAGNOSIS — G3184 Mild cognitive impairment, so stated: Secondary | ICD-10-CM | POA: Diagnosis not present

## 2017-02-26 DIAGNOSIS — C9200 Acute myeloblastic leukemia, not having achieved remission: Secondary | ICD-10-CM | POA: Diagnosis not present

## 2017-02-26 DIAGNOSIS — E119 Type 2 diabetes mellitus without complications: Secondary | ICD-10-CM | POA: Diagnosis not present

## 2017-02-26 DIAGNOSIS — R627 Adult failure to thrive: Secondary | ICD-10-CM | POA: Diagnosis not present

## 2017-02-27 LAB — CUP PACEART REMOTE DEVICE CHECK
Implantable Pulse Generator Implant Date: 20180615
MDC IDC SESS DTM: 20181212230916

## 2017-03-05 DIAGNOSIS — R627 Adult failure to thrive: Secondary | ICD-10-CM | POA: Diagnosis not present

## 2017-03-05 DIAGNOSIS — G3184 Mild cognitive impairment, so stated: Secondary | ICD-10-CM | POA: Diagnosis not present

## 2017-03-05 DIAGNOSIS — C9200 Acute myeloblastic leukemia, not having achieved remission: Secondary | ICD-10-CM | POA: Diagnosis not present

## 2017-03-05 DIAGNOSIS — I69351 Hemiplegia and hemiparesis following cerebral infarction affecting right dominant side: Secondary | ICD-10-CM | POA: Diagnosis not present

## 2017-03-05 DIAGNOSIS — I10 Essential (primary) hypertension: Secondary | ICD-10-CM | POA: Diagnosis not present

## 2017-03-05 DIAGNOSIS — E119 Type 2 diabetes mellitus without complications: Secondary | ICD-10-CM | POA: Diagnosis not present

## 2017-03-07 DIAGNOSIS — R627 Adult failure to thrive: Secondary | ICD-10-CM | POA: Diagnosis not present

## 2017-03-07 DIAGNOSIS — I69351 Hemiplegia and hemiparesis following cerebral infarction affecting right dominant side: Secondary | ICD-10-CM | POA: Diagnosis not present

## 2017-03-07 DIAGNOSIS — E119 Type 2 diabetes mellitus without complications: Secondary | ICD-10-CM | POA: Diagnosis not present

## 2017-03-07 DIAGNOSIS — I10 Essential (primary) hypertension: Secondary | ICD-10-CM | POA: Diagnosis not present

## 2017-03-07 DIAGNOSIS — G3184 Mild cognitive impairment, so stated: Secondary | ICD-10-CM | POA: Diagnosis not present

## 2017-03-07 DIAGNOSIS — C9200 Acute myeloblastic leukemia, not having achieved remission: Secondary | ICD-10-CM | POA: Diagnosis not present

## 2017-03-13 DIAGNOSIS — G3184 Mild cognitive impairment, so stated: Secondary | ICD-10-CM | POA: Diagnosis not present

## 2017-03-13 DIAGNOSIS — I69351 Hemiplegia and hemiparesis following cerebral infarction affecting right dominant side: Secondary | ICD-10-CM | POA: Diagnosis not present

## 2017-03-13 DIAGNOSIS — I10 Essential (primary) hypertension: Secondary | ICD-10-CM | POA: Diagnosis not present

## 2017-03-13 DIAGNOSIS — C9200 Acute myeloblastic leukemia, not having achieved remission: Secondary | ICD-10-CM | POA: Diagnosis not present

## 2017-03-13 DIAGNOSIS — E119 Type 2 diabetes mellitus without complications: Secondary | ICD-10-CM | POA: Diagnosis not present

## 2017-03-13 DIAGNOSIS — R627 Adult failure to thrive: Secondary | ICD-10-CM | POA: Diagnosis not present

## 2017-03-14 DIAGNOSIS — I10 Essential (primary) hypertension: Secondary | ICD-10-CM | POA: Diagnosis not present

## 2017-03-14 DIAGNOSIS — E119 Type 2 diabetes mellitus without complications: Secondary | ICD-10-CM | POA: Diagnosis not present

## 2017-03-14 DIAGNOSIS — C9200 Acute myeloblastic leukemia, not having achieved remission: Secondary | ICD-10-CM | POA: Diagnosis not present

## 2017-03-14 DIAGNOSIS — G3184 Mild cognitive impairment, so stated: Secondary | ICD-10-CM | POA: Diagnosis not present

## 2017-03-14 DIAGNOSIS — R627 Adult failure to thrive: Secondary | ICD-10-CM | POA: Diagnosis not present

## 2017-03-14 DIAGNOSIS — I69351 Hemiplegia and hemiparesis following cerebral infarction affecting right dominant side: Secondary | ICD-10-CM | POA: Diagnosis not present

## 2017-03-17 DIAGNOSIS — C9200 Acute myeloblastic leukemia, not having achieved remission: Secondary | ICD-10-CM | POA: Diagnosis not present

## 2017-03-17 DIAGNOSIS — R471 Dysarthria and anarthria: Secondary | ICD-10-CM | POA: Diagnosis not present

## 2017-03-17 DIAGNOSIS — F482 Pseudobulbar affect: Secondary | ICD-10-CM | POA: Diagnosis not present

## 2017-03-17 DIAGNOSIS — I63332 Cerebral infarction due to thrombosis of left posterior cerebral artery: Secondary | ICD-10-CM | POA: Diagnosis not present

## 2017-03-17 DIAGNOSIS — G3184 Mild cognitive impairment, so stated: Secondary | ICD-10-CM | POA: Diagnosis not present

## 2017-03-17 DIAGNOSIS — I69351 Hemiplegia and hemiparesis following cerebral infarction affecting right dominant side: Secondary | ICD-10-CM | POA: Diagnosis not present

## 2017-03-17 DIAGNOSIS — I10 Essential (primary) hypertension: Secondary | ICD-10-CM | POA: Diagnosis not present

## 2017-03-17 DIAGNOSIS — E119 Type 2 diabetes mellitus without complications: Secondary | ICD-10-CM | POA: Diagnosis not present

## 2017-03-17 DIAGNOSIS — I69391 Dysphagia following cerebral infarction: Secondary | ICD-10-CM | POA: Diagnosis not present

## 2017-03-17 DIAGNOSIS — R627 Adult failure to thrive: Secondary | ICD-10-CM | POA: Diagnosis not present

## 2017-03-20 DIAGNOSIS — I10 Essential (primary) hypertension: Secondary | ICD-10-CM | POA: Diagnosis not present

## 2017-03-20 DIAGNOSIS — G3184 Mild cognitive impairment, so stated: Secondary | ICD-10-CM | POA: Diagnosis not present

## 2017-03-20 DIAGNOSIS — C9200 Acute myeloblastic leukemia, not having achieved remission: Secondary | ICD-10-CM | POA: Diagnosis not present

## 2017-03-20 DIAGNOSIS — E119 Type 2 diabetes mellitus without complications: Secondary | ICD-10-CM | POA: Diagnosis not present

## 2017-03-20 DIAGNOSIS — R627 Adult failure to thrive: Secondary | ICD-10-CM | POA: Diagnosis not present

## 2017-03-20 DIAGNOSIS — I69351 Hemiplegia and hemiparesis following cerebral infarction affecting right dominant side: Secondary | ICD-10-CM | POA: Diagnosis not present

## 2017-03-21 ENCOUNTER — Ambulatory Visit (INDEPENDENT_AMBULATORY_CARE_PROVIDER_SITE_OTHER): Payer: Medicare HMO | Admitting: *Deleted

## 2017-03-21 DIAGNOSIS — I639 Cerebral infarction, unspecified: Secondary | ICD-10-CM

## 2017-03-25 NOTE — Progress Notes (Signed)
Carelink Summary Report / Loop Recorder 

## 2017-03-26 ENCOUNTER — Encounter (HOSPITAL_COMMUNITY): Payer: Self-pay | Admitting: Internal Medicine

## 2017-03-26 ENCOUNTER — Inpatient Hospital Stay (HOSPITAL_COMMUNITY): Payer: Medicare HMO

## 2017-03-26 ENCOUNTER — Other Ambulatory Visit: Payer: Self-pay

## 2017-03-26 ENCOUNTER — Inpatient Hospital Stay (HOSPITAL_COMMUNITY): Payer: Medicare HMO | Attending: Hematology and Oncology | Admitting: Internal Medicine

## 2017-03-26 VITALS — BP 162/76 | HR 74 | Temp 98.6°F | Resp 18 | Wt 168.3 lb

## 2017-03-26 DIAGNOSIS — E785 Hyperlipidemia, unspecified: Secondary | ICD-10-CM

## 2017-03-26 DIAGNOSIS — C9211 Chronic myeloid leukemia, BCR/ABL-positive, in remission: Secondary | ICD-10-CM

## 2017-03-26 DIAGNOSIS — N189 Chronic kidney disease, unspecified: Secondary | ICD-10-CM

## 2017-03-26 DIAGNOSIS — K219 Gastro-esophageal reflux disease without esophagitis: Secondary | ICD-10-CM | POA: Diagnosis not present

## 2017-03-26 DIAGNOSIS — Z7982 Long term (current) use of aspirin: Secondary | ICD-10-CM

## 2017-03-26 DIAGNOSIS — Z8673 Personal history of transient ischemic attack (TIA), and cerebral infarction without residual deficits: Secondary | ICD-10-CM

## 2017-03-26 DIAGNOSIS — I129 Hypertensive chronic kidney disease with stage 1 through stage 4 chronic kidney disease, or unspecified chronic kidney disease: Secondary | ICD-10-CM

## 2017-03-26 DIAGNOSIS — R269 Unspecified abnormalities of gait and mobility: Secondary | ICD-10-CM

## 2017-03-26 DIAGNOSIS — C9210 Chronic myeloid leukemia, BCR/ABL-positive, not having achieved remission: Secondary | ICD-10-CM

## 2017-03-26 DIAGNOSIS — Z90710 Acquired absence of both cervix and uterus: Secondary | ICD-10-CM

## 2017-03-26 DIAGNOSIS — Z9221 Personal history of antineoplastic chemotherapy: Secondary | ICD-10-CM

## 2017-03-26 DIAGNOSIS — M1990 Unspecified osteoarthritis, unspecified site: Secondary | ICD-10-CM | POA: Diagnosis not present

## 2017-03-26 DIAGNOSIS — Z79899 Other long term (current) drug therapy: Secondary | ICD-10-CM | POA: Diagnosis not present

## 2017-03-26 LAB — CBC WITH DIFFERENTIAL/PLATELET
BASOS ABS: 0 10*3/uL (ref 0.0–0.1)
BASOS PCT: 0 %
EOS ABS: 0.1 10*3/uL (ref 0.0–0.7)
EOS PCT: 1 %
HCT: 39.5 % (ref 36.0–46.0)
Hemoglobin: 12.3 g/dL (ref 12.0–15.0)
Lymphocytes Relative: 32 %
Lymphs Abs: 2 10*3/uL (ref 0.7–4.0)
MCH: 27.5 pg (ref 26.0–34.0)
MCHC: 31.1 g/dL (ref 30.0–36.0)
MCV: 88.2 fL (ref 78.0–100.0)
MONO ABS: 0.5 10*3/uL (ref 0.1–1.0)
Monocytes Relative: 8 %
NEUTROS ABS: 3.6 10*3/uL (ref 1.7–7.7)
Neutrophils Relative %: 59 %
PLATELETS: 211 10*3/uL (ref 150–400)
RBC: 4.48 MIL/uL (ref 3.87–5.11)
RDW: 14.2 % (ref 11.5–15.5)
WBC: 6.1 10*3/uL (ref 4.0–10.5)

## 2017-03-26 LAB — COMPREHENSIVE METABOLIC PANEL
ALBUMIN: 3.8 g/dL (ref 3.5–5.0)
ALT: 15 U/L (ref 14–54)
AST: 18 U/L (ref 15–41)
Alkaline Phosphatase: 101 U/L (ref 38–126)
Anion gap: 10 (ref 5–15)
BUN: 22 mg/dL — AB (ref 6–20)
CHLORIDE: 107 mmol/L (ref 101–111)
CO2: 25 mmol/L (ref 22–32)
Calcium: 9.8 mg/dL (ref 8.9–10.3)
Creatinine, Ser: 1.03 mg/dL — ABNORMAL HIGH (ref 0.44–1.00)
GFR calc Af Amer: >60 mL/min (ref >60)
GFR calc non Af Amer: 53 mL/min — ABNORMAL LOW (ref >60)
GLUCOSE: 154 mg/dL — AB (ref 65–99)
POTASSIUM: 3.7 mmol/L (ref 3.5–5.1)
SODIUM: 142 mmol/L (ref 135–145)
Total Bilirubin: 0.6 mg/dL (ref 0.3–1.2)
Total Protein: 7.3 g/dL (ref 6.5–8.1)

## 2017-03-26 NOTE — Patient Instructions (Signed)
[facility] at [facility] Discharge Instructions  RECOMMENDATIONS MADE BY THE CONSULTANT AND ANY TEST RESULTS WILL BE SENT TO YOUR REFERRING PHYSICIAN.  You were seen today by Dr. [place]  Thank you for choosing [facility] at [facility] to provide your oncology and hematology care.  To afford each patient quality time with our provider, please arrive at least 15 minutes before your scheduled appointment time.    If you have a lab appointment with the Cancer Center please come in thru the  Main Entrance and check in at the main information desk  You need to re-schedule your appointment should you arrive 10 or more minutes late.  We strive to give you quality time with our providers, and arriving late affects you and other patients whose appointments are after yours.  Also, if you no show three or more times for appointments you may be dismissed from the clinic at the providers discretion.     Again, thank you for choosing [facility].  Our hope is that these requests will decrease the amount of time that you wait before being seen by our physicians.       _____________________________________________________________  Should you have questions after your visit to [facility], please contact our office at [phone] between the hours of 8:30 a.m. and 4:30 p.m.  Voicemails left after 4:30 p.m. will not be returned until the following business day.  For prescription refill requests, have your pharmacy contact our office.       Resources For Cancer Patients and their Caregivers ? American Cancer Society: Can assist with transportation, wigs, general needs, runs Look Good Feel Better.        1-888-227-6333 ? Cancer Care: Provides financial assistance, online support groups, medication/co-pay assistance.  1-800-813-HOPE (4673) ? [facility] Assists [place] cancer patients and their families  through emotional , educational and financial support.  336-427-4357 ? [place] DSS Where to apply for food stamps, Medicaid and utility assistance. 336-342-1394 ? RC[employer]: Transportation to medical appointments. 336-347-2287 ? Social Security Administration: May apply for disability if have a Stage IV cancer. 336-342-7796 1-800-772-1213 ? [place] A[facility], Disability and Transit Services: Assists with nutrition, care and transit needs. 336-349-2343  Cancer Center Support Programs: @10RELATIVEDAYS@ > Cancer Support Group  2nd Tuesday of the month 1pm-2pm, Journey Room  > Creative Journey  3rd Tuesday of the month 1130am-1pm, Journey Room  > Look Good Feel Better  1st Wednesday of the month 10am-12 noon, Journey Room (Call American Cancer Society to register 1-800-395-5775)     

## 2017-03-26 NOTE — Progress Notes (Signed)
April Page, [place] [id number]   CLINIC:  Medical Oncology/Hematology  PCP:  [facility], [place] [address] [phone]   REASON FOR VISIT:  Follow-up for CML, BCR/ABL positive  CURRENT THERAPY: Tasigna daily (STOPPED by patient/family in 09/2016) due to concern about recurrent strokes.    BRIEF ONCOLOGIC HISTORY:    Chronic myelogenous leukemia (CML), BCR-ABL1-positive ([facility])   06/01/2014 Tumor Marker    Results for April, Page (MRN [id number]) as of 12/02/2014 09:30  06/01/2014 09:59 BCR ABL1 / ABL1 IS: 71.220 (H)       07/14/2014 Bone Marrow Biopsy    Bone Marrow, Aspirate,Biopsy, and Clot, right iliac - HYPERCELLULAR BONE MARROW WITH CHRONIC MYELOGENOUS LEUKEMIA. - SEE COMMENT. PERIPHERAL BLOOD: - CHRONIC MYELOGENOUS LEUKEMIA. Diagnosis Note Previous PCR analysis performed for bcr-abl1 is report      07/21/2014 - 08/04/2014 Chemotherapy    Tasigna 300 mg BID      08/04/2014 Miscellaneous    Ran out of 2 week sample secondary to issues with insurance approval.      08/15/2014 -  Chemotherapy    Tasigna 300 mg BID      09/17/2014 Tumor Marker    b3a2 transcript % 4.122       12/02/2014 Tumor Marker    b3a2 transcript: 0.046       03/31/2015 Tumor Marker    b3a2 transcript:  <0.001 %         03/31/2015 Remission    Complete molecular response, no detectable BCR/ABL using PCR assay      05/24/2016 Remission    NEGATIVE for the BCR-ABL1 e1a2 (p190), e13a2 (b2a2, p210) and e14a2 (b3a2, p210) fusion transcripts.        HISTORY OF PRESENT ILLNESS:   Per the earliest note I could find in Epic by Dr. Larey Seat from 07/2014, it is not clear when she was originally diagnosed but she was previously treated with Gleevac for several years, and was switched to Tasigna due to an apparent Gleevac failure .  She achieved a MMR with Tasigna, but she elected to discontinue Tasigna since 09/2016, reportedly due to an  ischemic stroke that she suffered in 08/2016. She is since ebeing followed off treatment with a plan to resume treatment upon progression. Her last BCR - ABL; was in 11/2016   INTERVAL HISTORY:  April Page 72 y.o. female returns to cancer center for follow-up for CML. She has been doing well so far, she deneisn any bleeding, bruising, lakc of appetite, abdominal discomfort, no nausea or vomting, weight loss, appetitie is good, she does have some speech difficulties and gait issues due to prior CVA, but per her daughter, they have been stable .   REVIEW OF SYSTEMS:  Review of Systems  Constitutional: Negative.  Negative for chills, fatigue and fever.  HENT:   Negative for lump/mass, nosebleeds and trouble swallowing.   Eyes: Negative.   Respiratory: Negative.  Negative for chest tightness, cough and shortness of breath.   Cardiovascular: Negative.  Negative for chest pain and leg swelling.  Gastrointestinal: Negative.  Negative for abdominal distention, blood in stool, constipation, diarrhea, nausea and vomiting.  Endocrine: Negative.   Genitourinary: Negative.  Negative for hematuria.   Musculoskeletal: Positive for gait problem. Negative for arthralgias.  Skin: Negative.  Negative for rash.  Neurological: Positive for gait problem and speech difficulty. Negative for dizziness and headaches.  Hematological: Negative.  Negative for adenopathy. Does not bruise/bleed easily.  Psychiatric/Behavioral: Negative for depression and sleep disturbance. The patient is not nervous/anxious.      PAST MEDICAL/SURGICAL HISTORY:  Past Medical History:  Diagnosis Date  . Arthritis    stiff knees  . Blood dyscrasia   . Cancer ([facility])   . CVA (cerebral vascular accident) ([facility])   . GERD (gastroesophageal reflux disease)   . H/O: CML (chronic myeloid leukemia)   . Hyperlipidemia   . Hyperlipidemia   . Hypertension   . Obesity   . Pessary maintenance 07/01/2013  . Stroke ([facility])    x 3   Past  Surgical History:  Procedure Laterality Date  . ABDOMINAL HYSTERECTOMY    . CHOLECYSTECTOMY    . COLONOSCOPY  06/09/2002   GYJ:EHUDJSHF hemorrhoids; otherwise normal rectum, colon   . COLONOSCOPY N/A 08/25/2013   WYO:VZCHYIF diverticulosis. Single colonic polyp-removed  s/p segmental biopsy and stool sample. random colon bx negative. +benign leiomyoma.  . cystocele/rectocele repair  2009  . ESOPHAGOGASTRODUODENOSCOPY  06/09/2002   OYD:XAJOIN upper gastrointestinal tract s/p  54-French Maloney dilator  . FEMUR IM NAIL Right 04/12/2016   Procedure: INTRAMEDULLARY (IM) NAIL FEMORAL;  Surgeon: Nicholes Stairs, MD;  Location: [facility];  Service: Orthopedics;  Laterality: Right;  . HERNIA REPAIR    . LOOP RECORDER INSERTION N/A 08/23/2016   Procedure: Loop Recorder Insertion;  Surgeon: Constance Haw, MD;  Location: [facility];  Service: Cardiovascular;  Laterality: N/A;  . OOPHORECTOMY    . TEE WITHOUT CARDIOVERSION N/A 08/23/2016   Procedure: TRANSESOPHAGEAL ECHOCARDIOGRAM (TEE);  Surgeon: Fay Records, MD;  Location: [facility] ENDOSCOPY;  Service: Cardiovascular;  Laterality: N/A;     SOCIAL HISTORY:  Social History   Socioeconomic History  . Marital status: Widowed    Spouse name: Not on file  . Number of children: 6  . Years of education: associate  . Highest education level: Not on file  Social Needs  . Financial resource strain: Not on file  . Food insecurity - worry: Not on file  . Food insecurity - inability: Not on file  . Transportation needs - medical: Not on file  . Transportation needs - non-medical: Not on file  Occupational History    Employer: RETIRED  Tobacco Use  . Smoking status: Never Smoker  . Smokeless tobacco: Never Used  Substance and Sexual Activity  . Alcohol use: No  . Drug use: No  . Sexual activity: Not Currently    Birth control/protection: Surgical    Comment: hyst  Other Topics Concern  . Not on file  Social History Narrative  . Not  on file    FAMILY HISTORY:  Family History  Problem Relation Age of Onset  . Anuerysm Mother        deceased age 82, brain anuerysm  . Early death Mother 62  . Heart disease Father   . Hypertension Sister   . Obesity Brother   . Hypertension Sister   . Arthritis Sister   . Other Son        cardiac arrest  . Heart disease Child 19       cardiac arrest  . Colon cancer Neg Hx     CURRENT MEDICATIONS:  Outpatient Encounter Medications as of 03/26/2017  Medication Sig  . acetaminophen (TYLENOL) 325 MG tablet Take 2 tablets (650 mg total) by mouth every 4 (four) hours as needed for mild pain (or temp > 37.5 C (99.5 F)).  Marland Kitchen aspirin 325 MG tablet Take 1 tablet (325  mg total) by mouth daily.  Marland Kitchen atorvastatin (LIPITOR) 40 MG tablet Take 1 tablet (40 mg total) by mouth daily at 6 PM.  . FLUoxetine (PROZAC) 20 MG capsule TK 1 C PO QD  . losartan (COZAAR) 100 MG tablet Take 1 tablet (100 mg total) by mouth daily. Resume on 3/28  . Multiple Vitamin (MULTIVITAMIN WITH MINERALS) TABS tablet Take 1 tablet by mouth daily.  . polyethylene glycol (MIRALAX / GLYCOLAX) packet Take 17 g by mouth daily.  . [DISCONTINUED] clopidogrel (PLAVIX) 75 MG tablet Take 1 tablet (75 mg total) by mouth daily.  . [DISCONTINUED] isopropyl alcohol 70 % external solution Apply topically daily.   No facility-administered encounter medications on file as of 03/26/2017.     ALLERGIES:  No Known Allergies   PHYSICAL EXAM:  ECOG Performance status: 1-2 - Symptomatic; requires occasional assistance   Vitals:   03/26/17 1102  BP: (!) 162/76  Pulse: 74  Resp: 18  Temp: 98.6 F (37 C)  SpO2: 98%   Filed Weights   03/26/17 1102  Weight: 168 lb 4.8 oz (76.3 kg)    Physical Exam  Constitutional: She is oriented to person, place, and time.  HENT:  Head: Normocephalic.  Mouth/Throat: Oropharynx is clear and moist. No oropharyngeal exudate.  Eyes: No scleral icterus.  Neck: Normal range of motion. Neck supple.   Cardiovascular: Normal rate and regular rhythm.  Pulmonary/Chest: Effort normal. No respiratory distress.  Abdominal: Soft. There is no tenderness.  Musculoskeletal: She exhibits no edema.  Lymphadenopathy:    She has no cervical adenopathy.  Neurological: She is alert and oriented to person, place, and time.  Skin: Skin is warm. No rash noted.  Psychiatric: Memory and judgment normal.  Nursing note and vitals reviewed.    LABORATORY DATA:  I have reviewed the labs as listed below; CBC    Component Value Date/Time   WBC 6.1 03/26/2017 1021   RBC 4.48 03/26/2017 1021   HGB 12.3 03/26/2017 1021   HCT 39.5 03/26/2017 1021   PLT 211 03/26/2017 1021   MCV 88.2 03/26/2017 1021   MCH 27.5 03/26/2017 1021   MCHC 31.1 03/26/2017 1021   RDW 14.2 03/26/2017 1021   LYMPHSABS 2.0 03/26/2017 1021   MONOABS 0.5 03/26/2017 1021   EOSABS 0.1 03/26/2017 1021   BASOSABS 0.0 03/26/2017 1021   CMP Latest Ref Rng & Units 03/26/2017 12/24/2016 09/23/2016  Glucose 65 - 99 mg/dL 154(H) 118(H) 120(H)  BUN 6 - 20 mg/dL 22(H) 21(H) 20  Creatinine 0.44 - 1.00 mg/dL 1.03(H) 1.07(H) 1.07(H)  Sodium 135 - 145 mmol/L 142 142 144  Potassium 3.5 - 5.1 mmol/L 3.7 3.9 3.7  Chloride 101 - 111 mmol/L 107 105 107  CO2 22 - 32 mmol/L _0 Calcium 8.9 - 10.3 mg/dL 9.8 9.8 10.7(H)  Total Protein 6.5 - 8.1 g/dL 7.3 7.2 7.6  Total Bilirubin 0.3 - 1.2 mg/dL 0.6 0.6 0.8  Alkaline Phos 38 - 126 U/L 101 193(H) 107  AST 15 - 41 U/L _1 ALT 14 - 54 U/L 15 12(L) 16    PENDING LABS:  BCR-ABL today pending      ASSESSMENT & PLAN:   CML, BCR/ALB positive, chronic phase in hematologic remission:  -Noted complete molecular response with BCR/ABL negative in 09/2016. She elected to stop treatment with Nilotinib (Tasigna) around that time.   -CBC normal today, but BCR/ abl from 12/2016 showed a resrugence of  in BCR- abl transcript in  peripheral blood , but still under 0.1% .  It is expected to increase  further without any therapy. Today's levels are pending. They continue to prefer observation only without any active treatment . They feel strongly that her QOL is much better off therapy.   Mild CKD noted- stable   Mildly elevated alkaline phosphatase: resolved, normal today -Alk phos 193 today.  Was normal in 09/2016.  Bilirubin normal today and AST/ALT largely normal/stable as well.   -Will continue to monitor with labs in 3 months.   H/o IIschemic stroke- last in 08/2016- no recurrence, she is now only on ASA, plavix has been discontinued by neurology  Dispo:  -Return to cancer center in 3 months with labs.    All questions were answered to patient's stated satisfaction. Encouraged patient to call with any new concerns or questions before her next visit to the cancer center and we can certain see her sooner, if needed.      Orders placed this encounter:  No orders of the defined types were placed in this encounter.

## 2017-04-01 LAB — CUP PACEART REMOTE DEVICE CHECK
Date Time Interrogation Session: 20190111233931
MDC IDC PG IMPLANT DT: 20180615

## 2017-04-02 LAB — BCR-ABL1, CML/ALL, PCR, QUANT: b3a2 transcript: 3.6637 %

## 2017-04-07 ENCOUNTER — Ambulatory Visit: Payer: Medicare HMO | Admitting: Adult Health

## 2017-04-07 ENCOUNTER — Encounter (HOSPITAL_COMMUNITY): Payer: Self-pay | Admitting: Lab

## 2017-04-07 ENCOUNTER — Inpatient Hospital Stay (HOSPITAL_COMMUNITY): Payer: Medicare HMO | Attending: Internal Medicine | Admitting: Internal Medicine

## 2017-04-07 ENCOUNTER — Encounter (HOSPITAL_COMMUNITY): Payer: Self-pay | Admitting: Internal Medicine

## 2017-04-07 ENCOUNTER — Other Ambulatory Visit: Payer: Self-pay

## 2017-04-07 ENCOUNTER — Encounter: Payer: Self-pay | Admitting: Adult Health

## 2017-04-07 VITALS — BP 142/86 | HR 104 | Ht 66.0 in | Wt 169.0 lb

## 2017-04-07 VITALS — BP 161/79 | HR 77 | Temp 98.3°F | Resp 20 | Ht 66.0 in | Wt 168.0 lb

## 2017-04-07 DIAGNOSIS — E119 Type 2 diabetes mellitus without complications: Secondary | ICD-10-CM | POA: Insufficient documentation

## 2017-04-07 DIAGNOSIS — Z8673 Personal history of transient ischemic attack (TIA), and cerebral infarction without residual deficits: Secondary | ICD-10-CM | POA: Diagnosis not present

## 2017-04-07 DIAGNOSIS — Z4689 Encounter for fitting and adjustment of other specified devices: Secondary | ICD-10-CM

## 2017-04-07 DIAGNOSIS — I10 Essential (primary) hypertension: Secondary | ICD-10-CM | POA: Diagnosis not present

## 2017-04-07 DIAGNOSIS — Z7982 Long term (current) use of aspirin: Secondary | ICD-10-CM | POA: Insufficient documentation

## 2017-04-07 DIAGNOSIS — M1990 Unspecified osteoarthritis, unspecified site: Secondary | ICD-10-CM | POA: Insufficient documentation

## 2017-04-07 DIAGNOSIS — C9211 Chronic myeloid leukemia, BCR/ABL-positive, in remission: Secondary | ICD-10-CM | POA: Diagnosis not present

## 2017-04-07 DIAGNOSIS — Z79899 Other long term (current) drug therapy: Secondary | ICD-10-CM | POA: Insufficient documentation

## 2017-04-07 DIAGNOSIS — C9210 Chronic myeloid leukemia, BCR/ABL-positive, not having achieved remission: Secondary | ICD-10-CM

## 2017-04-07 DIAGNOSIS — Z9221 Personal history of antineoplastic chemotherapy: Secondary | ICD-10-CM | POA: Diagnosis not present

## 2017-04-07 DIAGNOSIS — K219 Gastro-esophageal reflux disease without esophagitis: Secondary | ICD-10-CM | POA: Insufficient documentation

## 2017-04-07 NOTE — Progress Notes (Signed)
Subjective:     Patient ID: April Page, female   DOB: 1946/01/11, 72 y.o.   MRN: [id number]  HPI [place] is a 72 year old black female in for pessary maintenance.She is still using her walker,sp fracture last February.  PCP is Dr Gerarda Fraction.   Review of Systems Denies any discharge, odor or itching Denies any  problems with urination or BMs Reviewed past medical,surgical, social and family history. Reviewed medications and allergies.     Objective:   Physical Exam BP (!) 142/86 (BP Location: Right Arm, Patient Position: Sitting, Cuff Size: Normal)   Pulse (!) 104   Ht 5\' 6"  (1.676 m)   Wt 169 lb (76.7 kg)   BMI 27.28 kg/m    Skin warm and dry, pessary easily removed and cleansed with warm water and soap, no lesions or soreness in vagina, tissues pale, and pessary reinserted.  Assessment:     1. Pessary maintenance       Plan:     F/U in 6 months for pessary maintenance or before if needed

## 2017-04-07 NOTE — Progress Notes (Deleted)
Referral sent to [facility].  Records faxed on 1/28.  They will contact patient

## 2017-04-07 NOTE — Patient Instructions (Signed)
F/U in 6 months for pessary cleaning or sooner if needed

## 2017-04-08 ENCOUNTER — Telehealth (HOSPITAL_COMMUNITY): Payer: Self-pay | Admitting: Adult Health

## 2017-04-08 ENCOUNTER — Encounter (HOSPITAL_COMMUNITY): Payer: Self-pay | Admitting: Emergency Medicine

## 2017-04-08 MED ORDER — DASATINIB 100 MG PO TABS: 100.0000 mg | ORAL_TABLET | Freq: Every day | ORAL | 1 refills | Status: DC

## 2017-04-08 MED ORDER — ONDANSETRON HCL 8 MG PO TABS: 8.0000 mg | ORAL_TABLET | Freq: Three times a day (TID) | ORAL | 0 refills | Status: DC | PRN

## 2017-04-08 NOTE — Progress Notes (Signed)
[facility] chemotherapy teaching pulled together.

## 2017-04-08 NOTE — Patient Instructions (Signed)
[place]   CHEMOTHERAPY INSTRUCTIONS  We are going to treat your CML with Sprycel.  You will take 100 mg (1 tablet) once a day.  You can take Sprycel with or without food in the morning or the evening.     POTENTIAL SIDE EFFECTS OF TREATMENT:  Dasatinib (Sprycel) Dasatinib is used to treat cancer. It is given orally (by mouth).  Possible Side Effects . Bone marrow depression. This is a decrease in the number of white blood cells, red blood cells, and platelets. This may raise your risk of infection, make you tired and weak (fatigue), and raise your risk of bleeding . Fluid retention - swelling of your legs, ankles and/or feet, or fluid build-up around your lungs, heart or elsewhere . Loose bowel movements (diarrhea) . Nausea . Headache . Tiredness . Rash . Trouble breathing . Joint, muscle and bone pain . Abnormal bleeding - symptoms may be coughing up blood, throwing up blood (may look like coffee grounds), red or black tarry bowel movements, abnormally heavy menstrual flow, nosebleeds or any other unusual bleeding. Note: Each of the side effects above was reported in 15% or greater of patients treated with dasatinib. Not all possible side effects are included above.  Warnings and Precautions . Severe bone marrow depression . Abnormal bleeding which may be life-threatening- symptoms may be coughing up blood, throwing up blood (may look like coffee grounds), red or black tarry bowel movements, abnormally heavy menstrual flow, nosebleeds or any other unusual bleeding. . Severe fluid build-up (retention) events . Trouble breathing because high pressure in the arteries of your lungs. . Abnormal heart beat and/or changes in the tissue of the heart. Some changes may happen that can cause your heart to have less ability to pump blood. . Severe allergic skin reaction. You may develop blisters on your skin that are filled with fluid or a severe red rash  all over your body that may be painful. . Tumor lysis syndrome: This drug may act on the cancer cells very quickly. This may affect how your kidneys work. Note: Some of the side effects above are very rare. If you have concerns and/or questions, please discuss them with your medical team.  Important Information . This drug contains lactose  How to Take Your Medication . You can take the medicine whole with or without food. Do not chew, cut or crush it. . If you take antacids, take this drug at least 2 hours before or 2 hours after you take the antacid. . Missed dose: If you vomit or miss a dose, take your next dose at the regular time, and contact your physician. Do not take 2 doses at the same time and do not double up on the next dose. Marland Kitchen Handling: Wash your hands after handling your medicine, your caretakers should not handle your medicine with bare hands and should wear latex gloves. . This drug may be present in the saliva, tears, sweat, urine, stool, vomit, semen, and vaginal secretions. Talk to your doctor and/or your nurse about the necessary precautions to take during this time. . Storage: Store this medicine in the original container at room temperature. Discuss with your nurse or your doctor how to dispose of unused medicine.  Treating Side Effects . Manage tiredness by pacing your activities for the day. . Be sure to include periods of rest between energy-draining activities. . To decrease infection, wash your hands regularly. . Avoid close contact with people who have  a cold, the flu, or other infections. . Take your temperature as your doctor or nurse tells you, and whenever you feel like you may have a fever. . To help decrease bleeding, use a soft toothbrush. Check with your nurse before using dental floss. . Be very careful when using knives or tools. . Use an electric shaver instead of a razor. . Drink plenty of fluids (a minimum of eight glasses per day is recommended). .  If you throw up or have loose bowel movements, you should drink more fluids so that you do not become dehydrated (lack water in the body from losing too much fluid). . If you get diarrhea, eat low-fiber foods that are high in protein and calories and avoid foods that can irritate your digestive tracts or lead to cramping. . Ask your nurse or doctor about medicine that can lessen or stop your diarrhea. . To help with nausea and vomiting, eat small, frequent meals instead of three large meals a day. Choose foods and drinks that are at room temperature. Ask your nurse or doctor about other helpful tips and medicine that is available to help or stop lessen these symptoms. Marland Kitchen Keeping your pain under control is important to your well-being. Please tell your doctor or nurse if you are experiencing pain. . If you get a rash do not put anything on it unless your doctor or nurse says you may. Keep the area around the rash clean and dry. Ask your doctor for medicine if your rash bothers you.   Food and Drug Interactions . Do not eat grapefruit or drink grapefruit juice while taking this medicine. Grapefruit and grapefruit juice may raise the levels of dasatinib in your body. This could make side effects worse. . Check with your doctor or pharmacist about all other prescription medicines and over-the-counter medicines and dietary supplements (vitamins, minerals, herbs and others) you are taking before starting this medicine as there are known drug interactions with dasatinib. Also, check with your doctor or pharmacist before starting any new prescription or over-the-counter medicines, or dietary supplements to make sure that there are no interactions. . Avoid the use of St. John's Wort while taking dasatinib as this may lower the levels of the drug in your body, which can make it less effective . Drugs that treat heartburn and stomach upset such Maalox, Mylanta, Protonix, Nexium, Prilosec, Pepcid, Tagamet,  and Zantac may lower the effect of your cancer treatment if taken with dasatinib. If you take antacids, take this drug at least 2 hours before or 2 hours after you take the antacid.  When to Call the Doctor Call your doctor or nurse if you have any of these symptoms and/or any new or unusual symptoms: . Fever of 100.5 F (38 C) or higher . Chills . Fatigue that interferes with your daily activities . Feeling dizzy or lightheaded . Trouble breathing . Pain in your chest . Cough . Weight gain of 5 pounds in one week . Feeling that your heart is beating in a fast or not normal way (palpitations) . Easy bleeding or bruising . Blood in your urine, vomit (bright red or coffee-ground) and/or stools (bright red, or black/tarry) . Coughing up blood . Loose bowel movements (diarrhea) 4 times a day or loose bowel movements with lack of strength or a feeling of being dizzy . Nausea that stops you from eating or drinking and/or is not relieved by prescribed medicines . Throwing up more than 3 times a day .  Pain that does not go away, or is not relieved by prescribed medicines . A headache that does not go away . New rash and/or itching . Rash that is not relieved by prescribed medicines . Flu-like symptoms: fever, headache, muscle and joint aches, and fatigue (low energy, feeling weak) . Signs of tumor lysis: Confusion or agitation, decreased urine, nausea/vomiting, diarrhea, muscle cramping, numbness and/or tingling, seizures. . If you think you may be pregnant  Reproduction Warnings . Pregnancy warning: This drug can have harmful effects on the unborn baby. Women of childbearing potential should use effective methods of birth control during your cancer treatment and for at least 30 days after treatment. Let your doctor know right away if you think you may be pregnant. . Breastfeeding warning: Women should not breast feed during treatment and for 2 weeks after treatment because this drug could  enter the breast milk and cause harm to a breast feeding baby. . Fertility warning: In men and women both, this drug may affect your ability to have children in the future. Talk with your doctor or nurse if you plan to have children. Ask for information on sperm or egg banking.     SELF CARE ACTIVITIES WHILE ON CHEMOTHERAPY: Hydration Increase your fluid intake 48 hours prior to treatment and drink at least 8 to 12 cups (64 ounces) of water/decaff beverages per day after treatment. You can still have your cup of coffee or soda but these beverages do not count as part of your 8 to 12 cups that you need to drink daily. No alcohol intake.  Medications Continue taking your normal prescription medication as prescribed.  If you start any new herbal or new supplements please let us know first to make sure it is safe.  Mouth Care Have teeth cleaned professionally before starting treatment. Keep dentures and partial plates clean. Use soft toothbrush and do not use mouthwashes that contain alcohol. Biotene is a good mouthwash that is available at most pharmacies or may be ordered by calling [phone]. Use warm salt water gargles (1 teaspoon salt per 1 quart warm water) before and after meals and at bedtime. Or you may rinse with 2 tablespoons of three-percent hydrogen peroxide mixed in eight ounces of water. If you are still having problems with your mouth or sores in your mouth please call the clinic. If you need dental work, please let the doctor know before you go for your appointment so that we can coordinate the best possible time for you in regards to your chemo regimen. You need to also let your dentist know that you are actively taking chemo. We may need to do labs prior to your dental appointment.  Skin Care Always use sunscreen that has not expired and with SPF (Sun Protection Factor) of 50 or higher. Wear hats to protect your head from the sun. Remember to use sunscreen on your hands, ears,  face, & feet.  Use good moisturizing lotions such as udder cream, eucerin, or even Vaseline. Some chemotherapies can cause dry skin, color changes in your skin and nails.    . Avoid long, hot showers or baths. . Use gentle, fragrance-free soaps and laundry detergent. . Use moisturizers, preferably creams or ointments rather than lotions because the thicker consistency is better at preventing skin dehydration. Apply the cream or ointment within 15 minutes of showering. Reapply moisturizer at night, and moisturize your hands every time after you wash them.      Hair Loss (if your doctor  says your hair will fall out)  . If your doctor says that your hair is likely to fall out, decide before you begin chemo whether you want to wear a wig. You may want to shop before treatment to match your hair color. . Hats, turbans, and scarves can also camouflage hair loss, although some people prefer to leave their heads uncovered. If you go bare-headed outdoors, be sure to use sunscreen on your scalp. . Cut your hair short. It eases the inconvenience of shedding lots of hair, but it also can reduce the emotional impact of watching your hair fall out. . Don't perm or color your hair during chemotherapy. Those chemical treatments are already damaging to hair and can enhance hair loss. Once your chemo treatments are done and your hair has grown back, it's OK to resume dyeing or perming hair. With chemotherapy, hair loss is almost always temporary. But when it grows back, it may be a different color or texture. In older adults who still had hair color before chemotherapy, the new growth may be completely gray.  Often, new hair is very fine and soft.  Infection Prevention Please wash your hands for at least 30 seconds using warm soapy water. Handwashing is the #1 way to prevent the spread of germs. Stay away from sick people or people who are getting over a cold. If you develop respiratory systems such as green/yellow  mucus production or productive cough or persistent cough let us know and we will see if you need an antibiotic. It is a good idea to keep a pair of gloves on when going into grocery stores/[employer] to decrease your risk of coming into contact with germs on the carts, etc. Carry alcohol hand gel with you at all times and use it frequently if out in public. If your temperature reaches 100.5 or higher please call the clinic and let us know.  If it is after hours or on the weekend please go to the ER if your temperature is over 100.5.  Please have your own personal thermometer at home to use.    Sex and bodily fluids If you are going to have sex, a condom must be used to protect the person that isn't taking chemotherapy. Chemo can decrease your libido (sex drive). For a few days after chemotherapy, chemotherapy can be excreted through your bodily fluids.  When using the toilet please close the lid and flush the toilet twice.  Do this for a few day after you have had chemotherapy. Effects of chemotherapy on your sex life Some changes are simple and won't last long. They won't affect your sex life permanently. Sometimes you may feel: . too tired . not strong enough to be very active . sick or sore  . not in the mood . anxious or low Your anxiety might not seem related to sex. For example, you may be worried about the cancer and how your treatment is going. Or you may be worried about money, or about how you family are coping with your illness. These things can cause stress, which can affect your interest in sex. It's important to talk to your partner about how you feel. Remember - the changes to your sex life don't usually last long. There's usually no medical reason to stop having sex during chemo. The drugs won't have any long term physical effects on your performance or enjoyment of sex. Cancer can't be passed on to your partner during sex  Contraception It's important to use  reliable contraception  during treatment. Avoid getting pregnant while you or your partner are having chemotherapy. This is because the drugs may harm the baby. Sometimes chemotherapy drugs can leave a man or woman infertile.  This means you would not be able to have children in the future. You might want to talk to someone about permanent infertility. It can be very difficult to learn that you may no longer be able to have children. Some people find counselling helpful. There might be ways to preserve your fertility, although this is easier for men than for women. You may want to speak to a fertility expert. You can talk about sperm banking or harvesting your eggs. You can also ask about other fertility options, such as donor eggs. If you have or have had breast cancer, your doctor might advise you not to take the contraceptive pill. This is because the hormones in it might affect the cancer.  It is not known for sure whether or not chemotherapy drugs can be passed on through semen or secretions from the vagina. Because of this some doctors advise people to use a barrier method if you have sex during treatment. This applies to vaginal, anal or oral sex. Generally, doctors advise a barrier method only for the time you are actually having the treatment and for about a week after your treatment. Advice like this can be worrying, but this does not mean that you have to avoid being intimate with your partner. You can still have close contact with your partner and continue to enjoy sex.  Animals If you have cats or birds we just ask that you not change the litter or change the cage.  Please have someone else do this for you while you are on chemotherapy.   Food Safety During and After Cancer Treatment Food safety is important for people both during and after cancer treatment. Cancer and cancer treatments, such as chemotherapy, radiation therapy, and stem cell/bone marrow transplantation, often weaken the immune system. This makes it  harder for your body to protect itself from foodborne illness, also called food poisoning. Foodborne illness is caused by eating food that contains harmful bacteria, parasites, or viruses.  Foods to avoid Some foods have a higher risk of becoming tainted with bacteria. These include: Marland Kitchen Unwashed fresh fruit and vegetables, especially leafy vegetables that can hide dirt and other contaminants . Raw sprouts, such as alfalfa sprouts . Raw or undercooked beef, especially ground beef, or other raw or undercooked meat and poultry . Fatty, fried, or spicy foods immediately before or after treatment.  These can sit heavy on your stomach and make you feel nauseous. . Raw or undercooked shellfish, such as oysters. . Sushi and sashimi, which often contain raw fish.  . Unpasteurized beverages, such as unpasteurized fruit juices, raw milk, raw yogurt, or cider . Undercooked eggs, such as soft boiled, over easy, and poached; raw, unpasteurized eggs; or foods made with raw egg, such as homemade raw cookie dough and homemade mayonnaise Simple steps for food safety Shop smart. . Do not buy food stored or displayed in an unclean area. . Do not buy bruised or damaged fruits or vegetables. . Do not buy cans that have cracks, dents, or bulges. . Pick up foods that can spoil at the end of your shopping trip and store them in a cooler on the way home. Prepare and clean up foods carefully. . Rinse all fresh fruits and vegetables under running water, and dry them with a  clean towel or paper towel. . Clean the top of cans before opening them. . After preparing food, wash your hands for 20 seconds with hot water and soap. Pay special attention to areas between fingers and under nails. . Clean your utensils and dishes with hot water and soap. Marland Kitchen Disinfect your kitchen and cutting boards using 1 teaspoon of liquid, unscented bleach mixed into 1 quart of water.   Dispose of old food. . Eat canned and packaged food before  its expiration date (the "use by" or "best before" date). . Consume refrigerated leftovers within 3 to 4 days. After that time, throw out the food. Even if the food does not smell or look spoiled, it still may be unsafe. Some bacteria, such as Listeria, can grow even on foods stored in the refrigerator if they are kept for too long. Take precautions when eating out. . At restaurants, avoid buffets and salad bars where food sits out for a long time and comes in contact with many people. Food can become contaminated when someone with a virus, often a norovirus, or another "bug" handles it. . Put any leftover food in a "to-go" container yourself, rather than having the server do it. And, refrigerate leftovers as soon as you get home. . Choose restaurants that are clean and that are willing to prepare your food as you order it cooked.            MEDICATIONS: Over-the-Counter Meds:  Miralax 1 capful in 8 oz of fluid daily. May increase to two times a day if needed. This is a stool softener. If this doesn't work proceed you can add:  Senokot S-start with 1 tablet two times a day and increase to 4 tablets two times a day if needed. (total of 8 tablets in a 24 hour period). This is a stimulant laxative.   Call us if this does not help your bowels move.   Imodium 2mg  capsule. Take 2 capsules after the 1st loose stool and then 1 capsule every 2 hours until you go a total of 12 hours without having a loose stool. Call the [facility] if loose stools continue. If diarrhea occurs @ bedtime, take 2 capsules @ bedtime. Then take 2 capsules every 4 hours until morning. Call [facility].    Constipation Sheet *Miralax in 8 oz of fluid daily.  May increase to two times a day if needed.  This is a stool softener.  If this not enough to keep your bowel regular:  You can add:  *Senokot S, start with one tablet twice a day and can increase to 4 tablets twice a day if needed.  This is a stimulant  laxative.   Sometimes when you take pain medication you need BOTH a medicine to keep your stool soft and a medicine to help your bowel push it out!  Please call if the above does not work for you.   Do not go more than 2 days without a bowel movement.  It is very important that you do not become constipated.  It will make you feel sick to your stomach (nausea) and can cause abdominal pain and vomiting.   Diarrhea Sheet  If you are having loose stools/diarrhea, please purchase Imodium and begin taking as outlined:  At the first sign of poorly formed or loose stools you should begin taking Imodium(loperamide) 2 mg capsules.  Take two caplets (4mg ) followed by one caplet (2mg ) every 2 hours until you have had no diarrhea  for 12 hours.  During the night take two caplets (4mg ) at bedtime and continue every 4 hours during the night until the morning.  Stop taking Imodium only after there is no sign of diarrhea for 12 hours.    Always call the [facility] if you are having loose stools/diarrhea that you can't get under control.  Loose stools/diarrhea leads to dehydration (loss of water) in your body.  We have other options of trying to get the loose stools/diarrhea to stopped but you must let us know!    SYMPTOMS TO REPORT AS SOON AS POSSIBLE AFTER TREATMENT:  FEVER GREATER THAN 100.5 F  CHILLS WITH OR WITHOUT FEVER  NAUSEA AND VOMITING THAT IS NOT CONTROLLED WITH YOUR NAUSEA MEDICATION  UNUSUAL SHORTNESS OF BREATH  UNUSUAL BRUISING OR BLEEDING  TENDERNESS IN MOUTH AND THROAT WITH OR WITHOUT PRESENCE OF ULCERS  URINARY PROBLEMS  BOWEL PROBLEMS  UNUSUAL RASH     Wear comfortable clothing and clothing appropriate for easy access to any Portacath or PICC line. Let us know if there is anything that we can do to make your therapy better!      What to do if you need assistance after hours or on the weekends: CALL [phone].  HOLD on the line, do not hang up.  You will hear  multiple messages but at the end you will be connected with a nurse triage line.  They will contact the doctor if necessary.  Most of the time they will be able to assist you.  Do not call the hospital operator.     I have been informed and understand all of the instructions given to me and have received a copy. I have been instructed to call the clinic [phone] or my family physician as soon as possible for continued medical care, if indicated. I do not have any more questions at this time but understand that I may call the [facility] or the Patient Navigator at [phone] during office hours should I have questions or need assistance in obtaining follow-up care.

## 2017-04-08 NOTE — Telephone Encounter (Signed)
FAXED NEW [place]

## 2017-04-09 ENCOUNTER — Inpatient Hospital Stay (HOSPITAL_COMMUNITY): Payer: Medicare HMO

## 2017-04-09 NOTE — Progress Notes (Signed)
[facility] up:    [address], [place] [address]   DIAGNOSIS: Cancer Staging No matching staging information was found for the patient.   Assessment/plan:  1.  CML, BCR/ABL +.  Tasigna daily (STOPPED by patient/family in 09/2016) due to concern about recurrent strokes. She remains off TKI therapy.  I discussed with pt CML diagnosis and response to therapy with TKI therapy.  She was presented option of Sprycel 100 mg po daily.  I have discussed side effects of medication.  She will be set up for chemotherapy teaching.  I have discussed with them the abnormal labs are related to untreated CML and she will have repeat testing for  BCR/ABL in 3 months and will RTC to assess tolerance of therapy.  She is advised to notify the office if she has any problems prior to her next visit.    2.  Strokes.  Pt is advised to followup with PCP for monitoring.    3.  Hypertension.  BP is 161/79.  Continue to followup with PCP.      Diagnosis:  CML, BCR/ABL positive  CURRENT THERAPY: Tasigna daily (STOPPED by patient/family in 09/2016) due to concern about recurrent strokes.   Current Status:  Pt is seen today for followup.  She is accompanied by family member who reports she was contacted by office regarding abnormal labs.  When questioned, pt remains off TKI  Therapy.     SUMMARY OF ONCOLOGIC HISTORY:   Chronic myelogenous leukemia (CML), BCR-ABL1-positive ([place])   06/01/2014 Tumor Marker    Results for VIANA, SLEEP (MRN [id number]) as of 12/02/2014 09:30  06/01/2014 09:59 BCR ABL1 / ABL1 IS: 71.220 (H)       07/14/2014 Bone Marrow Biopsy    Bone Marrow, Aspirate,Biopsy, and Clot, right iliac - HYPERCELLULAR BONE MARROW WITH CHRONIC MYELOGENOUS LEUKEMIA. - SEE COMMENT. PERIPHERAL BLOOD: - CHRONIC MYELOGENOUS LEUKEMIA. Diagnosis Note Previous PCR analysis performed for bcr-abl1 is report      07/21/2014 - 08/04/2014 Chemotherapy    Tasigna 300  mg BID      08/04/2014 Miscellaneous    Ran out of 2 week sample secondary to issues with insurance approval.      08/15/2014 -  Chemotherapy    Tasigna 300 mg BID      09/17/2014 Tumor Marker    b3a2 transcript % 4.122       12/02/2014 Tumor Marker    b3a2 transcript: 0.046       03/31/2015 Tumor Marker    b3a2 transcript:  <0.001 %         03/31/2015 Remission    Complete molecular response, no detectable BCR/ABL using PCR assay      05/24/2016 Remission    NEGATIVE for the BCR-ABL1 e1a2 (p190), e13a2 (b2a2, p210) and e14a2 (b3a2, p210) fusion transcripts.       CURRENT THERAPY:  Pt has stopped TKI therapy.     Patient Active Problem List   Diagnosis Date Noted  . Controlled diabetes mellitus type 2 with complications ([place]) 47/11/6281  . Physical deconditioning   . Acute ischemic stroke ([place]) 08/20/2016  . Pressure injury of skin 08/17/2016  . Acute CVA (cerebrovascular accident) ([place]) 08/17/2016  . UTI (urinary tract infection) 08/16/2016  . Constipation 07/10/2016  . Dysphagia as late effect of cerebrovascular accident (CVA) 07/10/2016  . Loss of weight 07/10/2016  . Impaired glucose tolerance 06/05/2016  . Slurred speech   . Hypokalemia 04/13/2016  .  Hyperglycemia 04/13/2016  . Essential hypertension 04/13/2016  . Hyperlipidemia 04/13/2016  . S/P right hip fracture 04/12/2016  . Hip fracture ([place]) 04/12/2016  . Fall   . Itching in the vaginal area 10/05/2014  . Chronic myelogenous leukemia (CML), BCR-ABL1-positive ([place])   . GERD (gastroesophageal reflux disease) 12/06/2013  . Leukocytosis 08/06/2013  . Atypical chest pain 08/06/2013  . Chronic diarrhea 08/05/2013  . Pessary maintenance 07/01/2013    MEDICAL HISTORY: Past Medical History:  Diagnosis Date  . Arthritis    stiff knees  . Blood dyscrasia   . Cancer ([place])   . CVA (cerebral vascular accident) ([place])   . GERD (gastroesophageal reflux disease)   . H/O: CML (chronic myeloid leukemia)   .  Hyperlipidemia   . Hyperlipidemia   . Hypertension   . Obesity   . Pessary maintenance 07/01/2013  . Stroke ([place])    x 3    SURGICAL HISTORY: Past Surgical History:  Procedure Laterality Date  . ABDOMINAL HYSTERECTOMY    . CHOLECYSTECTOMY    . COLONOSCOPY  06/09/2002   FMB:[id number] hemorrhoids; otherwise normal rectum, colon   . COLONOSCOPY N/A 08/25/2013   TTS:[id number] diverticulosis. Single colonic polyp-removed  s/p segmental biopsy and stool sample. random colon bx negative. +benign leiomyoma.  . cystocele/rectocele repair  2009  . ESOPHAGOGASTRODUODENOSCOPY  06/09/2002   ZES:PQZRAQ upper gastrointestinal tract s/p  54-French Maloney dilator  . FEMUR IM NAIL Right 04/12/2016   Procedure: INTRAMEDULLARY (IM) NAIL FEMORAL;  Surgeon: Nicholes Stairs, MD;  Location: [facility];  Service: Orthopedics;  Laterality: Right;  . HERNIA REPAIR    . LOOP RECORDER INSERTION N/A 08/23/2016   Procedure: Loop Recorder Insertion;  Surgeon: Constance Haw, MD;  Location: [facility];  Service: Cardiovascular;  Laterality: N/A;  . OOPHORECTOMY    . TEE WITHOUT CARDIOVERSION N/A 08/23/2016   Procedure: TRANSESOPHAGEAL ECHOCARDIOGRAM (TEE);  Surgeon: Fay Records, MD;  Location: [facility] ENDOSCOPY;  Service: Cardiovascular;  Laterality: N/A;    SOCIAL HISTORY: Social History   Socioeconomic History  . Marital status: Widowed    Spouse name: Not on file  . Number of children: 6  . Years of education: associate  . Highest education level: Not on file  Social Needs  . Financial resource strain: Not on file  . Food insecurity - worry: Not on file  . Food insecurity - inability: Not on file  . Transportation needs - medical: Not on file  . Transportation needs - non-medical: Not on file  Occupational History    Employer: RETIRED  Tobacco Use  . Smoking status: Never Smoker  . Smokeless tobacco: Never Used  Substance and Sexual Activity  . Alcohol use: No  . Drug use: No  . Sexual  activity: Not Currently    Birth control/protection: Surgical    Comment: hyst  Other Topics Concern  . Not on file  Social History Narrative  . Not on file    FAMILY HISTORY: Family History  Problem Relation Age of Onset  . Anuerysm Mother        deceased age 53, brain anuerysm  . Early death Mother 74  . Heart disease Father   . Hypertension Sister   . Obesity Brother   . Hypertension Sister   . Arthritis Sister   . Other Son        cardiac arrest  . Heart disease Child 80       cardiac arrest  . Colon cancer Neg Hx  Prior to Admission medications   Medication Sig Start Date End Date Taking? Authorizing Provider  acetaminophen (TYLENOL) 325 MG tablet Take 2 tablets (650 mg total) by mouth every 4 (four) hours as needed for mild pain (or temp > 37.5 C (99.5 F)). 08/25/16  Yes Barton Dubois, MD  aspirin 325 MG tablet Take 1 tablet (325 mg total) by mouth daily. 06/05/16  Yes Kathie Dike, MD  atorvastatin (LIPITOR) 40 MG tablet Take 1 tablet (40 mg total) by mouth daily at 6 PM. 08/18/16  Yes Samuella Cota, MD  FLUoxetine (PROZAC) 20 MG capsule TK 1 C PO QD 03/17/17  Yes [provider]  losartan (COZAAR) 100 MG tablet Take 1 tablet (100 mg total) by mouth daily. Resume on 3/28 08/18/16  Yes Samuella Cota, MD  Multiple Vitamin (MULTIVITAMIN WITH MINERALS) TABS tablet Take 1 tablet by mouth daily.   Yes [provider]  polyethylene glycol (MIRALAX / GLYCOLAX) packet Take 17 g by mouth daily. 06/06/16  Yes Tat, Shanon Brow, MD  dasatinib (SPRYCEL) 100 MG tablet Take 1 tablet (100 mg total) by mouth daily. 04/08/17   Mansoor Hillyard, Mathis Dad, MD  ondansetron (ZOFRAN) 8 MG tablet Take 1 tablet (8 mg total) by mouth every 8 (eight) hours as needed for nausea or vomiting. 04/08/17   Kasyn Rolph, Mathis Dad, MD   Review of Systems - Oncology Negative except under neuro prior strokes.    NKDA  PHYSICAL EXAMINATION  ECOG PERFORMANCE STATUS: 2  Vitals:   04/07/17 1542  BP: (!)  161/79  Pulse: 77  Resp: 20  Temp: 98.3 F (36.8 C)  SpO2: 100%    Physical Exam:  Pt alert and oriented x 3, pleasant, cooperative. Vitals are as noted. Neck supple and free of adenopathy, or masses. No thyromegaly. PERLA. Ears, throat are normal. Lungs are clear to auscultation. Heart sounds are normal, no murmurs, clicks, gallops or rubs. Abdomen is soft, no tenderness, masses or organomegaly.  Extremities are normal. Peripheral pulses are normal. Neurological exam shows evidence of prior stroke. Skin is normal without suspicious lesions noted.  LABORATORY DATA:  CBC    Component Value Date/Time   WBC 6.1 03/26/2017 1021   RBC 4.48 03/26/2017 1021   HGB 12.3 03/26/2017 1021   HCT 39.5 03/26/2017 1021   PLT 211 03/26/2017 1021   MCV 88.2 03/26/2017 1021   MCH 27.5 03/26/2017 1021   MCHC 31.1 03/26/2017 1021   RDW 14.2 03/26/2017 1021   LYMPHSABS 2.0 03/26/2017 1021   MONOABS 0.5 03/26/2017 1021   EOSABS 0.1 03/26/2017 1021   BASOSABS 0.0 03/26/2017 1021    CMP     Component Value Date/Time   NA 142 03/26/2017 1021   K 3.7 03/26/2017 1021   CL 107 03/26/2017 1021   CO2 25 03/26/2017 1021   GLUCOSE 154 (H) 03/26/2017 1021   BUN 22 (H) 03/26/2017 1021   CREATININE 1.03 (H) 03/26/2017 1021   CALCIUM 9.8 03/26/2017 1021   PROT 7.3 03/26/2017 1021   ALBUMIN 3.8 03/26/2017 1021   AST 18 03/26/2017 1021   ALT 15 03/26/2017 1021   ALKPHOS 101 03/26/2017 1021   BILITOT 0.6 03/26/2017 1021   GFRNONAA 53 (L) 03/26/2017 1021   GFRAA >60 03/26/2017 1021    ASSESSMENT and THERAPY PLAN:  See above    Orders Placed This Encounter  Procedures  . CBC with Differential/Platelet    Standing Status:   Future    Standing Expiration Date:   04/07/2018  .  Comprehensive metabolic panel    Standing Status:   Future    Standing Expiration Date:   04/07/2018  . Lactate dehydrogenase    Standing Status:   Future    Standing Expiration Date:   04/07/2018    All questions were  answered. The patient knows to call the clinic with any problems, questions or concerns. We can certainly see the patient much sooner if necessary. This note was electronically signed. Zoila Shutter, MD 04/09/2017

## 2017-04-09 NOTE — Progress Notes (Signed)
Chemotherapy teaching completed with daughter [name] over the phone.  Extensive teaching packet ready for patient.

## 2017-04-22 ENCOUNTER — Ambulatory Visit (INDEPENDENT_AMBULATORY_CARE_PROVIDER_SITE_OTHER): Payer: Medicare HMO | Admitting: *Deleted

## 2017-04-22 DIAGNOSIS — I639 Cerebral infarction, unspecified: Secondary | ICD-10-CM | POA: Diagnosis not present

## 2017-04-23 NOTE — Progress Notes (Signed)
Carelink Summary Report / Loop Recorder 

## 2017-04-30 ENCOUNTER — Telehealth (HOSPITAL_COMMUNITY): Payer: Self-pay | Admitting: Emergency Medicine

## 2017-04-30 NOTE — Telephone Encounter (Signed)
Called to check with April Page to see if the pt has gotten her Sprycel yet.  The daughter and mother are suppose to call the drug company today together and give the company permission to speak with the daughter.  Explained to the daughter that I would like her to call me and let me know when she starts taking the medication.  She verbalized understanding.

## 2017-05-01 ENCOUNTER — Telehealth (HOSPITAL_COMMUNITY): Payer: Self-pay | Admitting: Emergency Medicine

## 2017-05-01 NOTE — Telephone Encounter (Signed)
pts daughter called to say that Sprycel will be delivered tomorrow, 05/02/2017.  She will call and let us know the exact day she starts taking the medication.

## 2017-05-07 ENCOUNTER — Telehealth (HOSPITAL_COMMUNITY): Payer: Self-pay | Admitting: Emergency Medicine

## 2017-05-07 NOTE — Telephone Encounter (Signed)
Spoke with Renea and pt started taking Sprycel on 05/03/2017.  Pt did have some diarrhea but resolved the next day on its own.  I explained how to use imodium if the pt did have diarrhea again.  She can take 2 at the first sign of diarrhea then 1 every 2 hours until she has no diarrhea for 12 hours.  She verbalized understanding.  Pt has an appt on 3/4 at 2:40 pm for lab work then to see the doctor.

## 2017-05-12 ENCOUNTER — Other Ambulatory Visit (HOSPITAL_COMMUNITY): Payer: Medicare HMO

## 2017-05-12 ENCOUNTER — Ambulatory Visit (HOSPITAL_COMMUNITY): Payer: Medicare HMO | Admitting: Internal Medicine

## 2017-05-14 LAB — CUP PACEART REMOTE DEVICE CHECK
Date Time Interrogation Session: 20190210233919
MDC IDC PG IMPLANT DT: 20180615

## 2017-05-23 ENCOUNTER — Ambulatory Visit (INDEPENDENT_AMBULATORY_CARE_PROVIDER_SITE_OTHER): Payer: Medicare HMO | Admitting: *Deleted

## 2017-05-23 DIAGNOSIS — I639 Cerebral infarction, unspecified: Secondary | ICD-10-CM

## 2017-05-26 NOTE — Progress Notes (Signed)
Carelink Summary Report / Loop Recorder 

## 2017-05-27 ENCOUNTER — Inpatient Hospital Stay (HOSPITAL_BASED_OUTPATIENT_CLINIC_OR_DEPARTMENT_OTHER): Payer: Medicare HMO | Admitting: Internal Medicine

## 2017-05-27 ENCOUNTER — Encounter (HOSPITAL_COMMUNITY): Payer: Self-pay | Admitting: Internal Medicine

## 2017-05-27 ENCOUNTER — Other Ambulatory Visit: Payer: Self-pay

## 2017-05-27 ENCOUNTER — Inpatient Hospital Stay (HOSPITAL_COMMUNITY): Payer: Medicare HMO | Attending: Internal Medicine

## 2017-05-27 VITALS — BP 161/70 | HR 71 | Temp 98.2°F | Resp 20 | Wt 163.5 lb

## 2017-05-27 DIAGNOSIS — E119 Type 2 diabetes mellitus without complications: Secondary | ICD-10-CM | POA: Insufficient documentation

## 2017-05-27 DIAGNOSIS — Z7982 Long term (current) use of aspirin: Secondary | ICD-10-CM | POA: Diagnosis not present

## 2017-05-27 DIAGNOSIS — K219 Gastro-esophageal reflux disease without esophagitis: Secondary | ICD-10-CM

## 2017-05-27 DIAGNOSIS — I10 Essential (primary) hypertension: Secondary | ICD-10-CM | POA: Insufficient documentation

## 2017-05-27 DIAGNOSIS — E786 Lipoprotein deficiency: Secondary | ICD-10-CM

## 2017-05-27 DIAGNOSIS — Z79899 Other long term (current) drug therapy: Secondary | ICD-10-CM | POA: Diagnosis not present

## 2017-05-27 DIAGNOSIS — Z8673 Personal history of transient ischemic attack (TIA), and cerebral infarction without residual deficits: Secondary | ICD-10-CM

## 2017-05-27 DIAGNOSIS — E785 Hyperlipidemia, unspecified: Secondary | ICD-10-CM | POA: Diagnosis not present

## 2017-05-27 DIAGNOSIS — Z9221 Personal history of antineoplastic chemotherapy: Secondary | ICD-10-CM

## 2017-05-27 DIAGNOSIS — C9210 Chronic myeloid leukemia, BCR/ABL-positive, not having achieved remission: Secondary | ICD-10-CM | POA: Insufficient documentation

## 2017-05-27 DIAGNOSIS — E876 Hypokalemia: Secondary | ICD-10-CM | POA: Insufficient documentation

## 2017-05-27 LAB — CBC WITH DIFFERENTIAL/PLATELET
Basophils Absolute: 0 10*3/uL (ref 0.0–0.1)
Basophils Relative: 1 %
Eosinophils Absolute: 0.1 10*3/uL (ref 0.0–0.7)
Eosinophils Relative: 1 %
HCT: 36.2 % (ref 36.0–46.0)
HEMOGLOBIN: 11.3 g/dL — AB (ref 12.0–15.0)
LYMPHS PCT: 39 %
Lymphs Abs: 2.1 10*3/uL (ref 0.7–4.0)
MCH: 27.7 pg (ref 26.0–34.0)
MCHC: 31.2 g/dL (ref 30.0–36.0)
MCV: 88.7 fL (ref 78.0–100.0)
MONO ABS: 0.4 10*3/uL (ref 0.1–1.0)
MONOS PCT: 7 %
NEUTROS PCT: 52 %
Neutro Abs: 2.8 10*3/uL (ref 1.7–7.7)
Platelets: 199 10*3/uL (ref 150–400)
RBC: 4.08 MIL/uL (ref 3.87–5.11)
RDW: 14.6 % (ref 11.5–15.5)
WBC: 5.4 10*3/uL (ref 4.0–10.5)

## 2017-05-27 LAB — COMPREHENSIVE METABOLIC PANEL
ALK PHOS: 119 U/L (ref 38–126)
ALT: 19 U/L (ref 14–54)
AST: 23 U/L (ref 15–41)
Albumin: 3.8 g/dL (ref 3.5–5.0)
Anion gap: 10 (ref 5–15)
BILIRUBIN TOTAL: 0.8 mg/dL (ref 0.3–1.2)
BUN: 19 mg/dL (ref 6–20)
CALCIUM: 9.2 mg/dL (ref 8.9–10.3)
CO2: 25 mmol/L (ref 22–32)
Chloride: 106 mmol/L (ref 101–111)
Creatinine, Ser: 1.06 mg/dL — ABNORMAL HIGH (ref 0.44–1.00)
GFR calc Af Amer: 59 mL/min — ABNORMAL LOW (ref >60)
GFR, EST NON AFRICAN AMERICAN: 51 mL/min — AB (ref >60)
Glucose, Bld: 133 mg/dL — ABNORMAL HIGH (ref 65–99)
POTASSIUM: 3.4 mmol/L — AB (ref 3.5–5.1)
Sodium: 141 mmol/L (ref 135–145)
TOTAL PROTEIN: 7.1 g/dL (ref 6.5–8.1)

## 2017-05-27 LAB — LACTATE DEHYDROGENASE: LDH: 184 U/L (ref 98–192)

## 2017-05-27 NOTE — Patient Instructions (Addendum)
[place] at [facility] Discharge Instructions  You were seen today by Dr. Walden Field. She went over your recent lab results and everything looked good. She wants you to take your new medication for 3 months and then we will follow up to see how you are doing.    Thank you for choosing [facility] at [facility] to provide your oncology and hematology care.  To afford each patient quality time with our provider, please arrive at least 15 minutes before your scheduled appointment time.   If you have a lab appointment with the [facility] please come in thru the  Main Entrance and check in at the main information desk  You need to re-schedule your appointment should you arrive 10 or more minutes late.  We strive to give you quality time with our providers, and arriving late affects you and other patients whose appointments are after yours.  Also, if you no show three or more times for appointments you may be dismissed from the clinic at the providers discretion.     Again, thank you for choosing [facility].  Our hope is that these requests will decrease the amount of time that you wait before being seen by our physicians.       _____________________________________________________________  Should you have questions after your visit to [facility], please contact our office at [phone] between the hours of 8:30 a.m. and 4:30 p.m.  Voicemails left after 4:30 p.m. will not be returned until the following business day.  For prescription refill requests, have your pharmacy contact our office.       Resources For Cancer Patients and their Caregivers ? American Cancer Society: Can assist with transportation, wigs, general needs, runs Look Good Feel Better.        640-204-2216 ? Cancer Care: Provides financial assistance, online support groups, medication/co-pay assistance.  1-800-813-HOPE (562) 295-1758) ? [facility] Assists [place] cancer patients and their families through emotional , educational and financial support.  720-669-2823 ? [place] DSS Where to apply for food stamps, Medicaid and utility assistance. (772)884-6390 ? [facility]: Transportation to medical appointments. 817-567-3621 ? Social Security Administration: May apply for disability if have a Stage IV cancer. 503-082-8906 409-867-7857 ? [employer], Disability and Transit Services: Assists with nutrition, care and transit needs. [facility] Support Programs:   > Cancer Support Group  2nd Tuesday of the month 1pm-2pm, Journey Room   > Creative Journey  3rd Tuesday of the month 1130am-1pm, Journey Room

## 2017-05-27 NOTE — Progress Notes (Signed)
Diagnosis Chronic myeloid leukemia, BCR/ABL-positive, not having achieved remission ([place]) - Plan: CBC with Differential/Platelet, Comprehensive metabolic panel, Lactate dehydrogenase, BCR-ABL1, CML/ALL, PCR, QUANT  Staging Cancer Staging No matching staging information was found for the patient.  Diagnosis:  CML, BCR/ABL positive  CURRENT THERAPY: Tasigna daily (STOPPED by patient/family in 09/2016) due to concern about recurrent strokes. She has now started Sprycel 04/2017.    Assessment and Plan:  1.  CML, BCR/ABL +.  Tasigna daily (STOPPED by patient/family in 09/2016) due to concern about recurrent strokes. She has now received Sprycel and reports she has been taking the medication for 1 month and is tolerating therapy.  Labs WNL today with WBC 5.4 HB 11 plts 199,000.  She should continue Sprycel 100 mg po daily.  He will return to clinic in 3 months for repeat labs and will recheck BCR/ABL at that time for comparison to prior results when pt was off medication.    2.  Strokes.  Pt is advised to followup with PCP for monitoring.    3.  Hypertension.  BP is 161/70.  Followup with PCP.     Current Status:  Pt is seen today for followup.  She is accompanied by family member.  Pt reports she has been taking Sprycel for a month and is tolerating therapy.       Chronic myelogenous leukemia (CML), BCR-ABL1-positive ([place])   06/01/2014 Tumor Marker    Results for KERYL, GHOLSON (MRN [id number]) as of 12/02/2014 09:30  06/01/2014 09:59 BCR ABL1 / ABL1 IS: 71.220 (H)       07/14/2014 Bone Marrow Biopsy    Bone Marrow, Aspirate,Biopsy, and Clot, right iliac - HYPERCELLULAR BONE MARROW WITH CHRONIC MYELOGENOUS LEUKEMIA. - SEE COMMENT. PERIPHERAL BLOOD: - CHRONIC MYELOGENOUS LEUKEMIA. Diagnosis Note Previous PCR analysis performed for bcr-abl1 is report      07/21/2014 - 08/04/2014 Chemotherapy    Tasigna 300 mg BID      08/04/2014 Miscellaneous    Ran out of 2 week sample secondary to issues with  insurance approval.      08/15/2014 -  Chemotherapy    Tasigna 300 mg BID      09/17/2014 Tumor Marker    b3a2 transcript % 4.122       12/02/2014 Tumor Marker    b3a2 transcript: 0.046       03/31/2015 Tumor Marker    b3a2 transcript:  <0.001 %         03/31/2015 Remission    Complete molecular response, no detectable BCR/ABL using PCR assay      05/24/2016 Remission    NEGATIVE for the BCR-ABL1 e1a2 (p190), e13a2 (b2a2, p210) and e14a2 (b3a2, p210) fusion transcripts.       Problem List Patient Active Problem List   Diagnosis Date Noted  . Controlled diabetes mellitus type 2 with complications ([place]) [D98.3] 08/26/2016  . Physical deconditioning [R53.81]   . Acute ischemic stroke ([place]) [I63.9] 08/20/2016  . Pressure injury of skin [L89.90] 08/17/2016  . Acute CVA (cerebrovascular accident) ([place]) [I63.9] 08/17/2016  . UTI (urinary tract infection) [N39.0] 08/16/2016  . Constipation [K59.00] 07/10/2016  . Dysphagia as late effect of cerebrovascular accident (CVA) [I69.391] 07/10/2016  . Loss of weight [R63.4] 07/10/2016  . Impaired glucose tolerance [R73.02] 06/05/2016  . Slurred speech [R47.81]   . Hypokalemia [E87.6] 04/13/2016  . Hyperglycemia [R73.9] 04/13/2016  . Essential hypertension [I10] 04/13/2016  . Hyperlipidemia [E78.5] 04/13/2016  . S/P right hip fracture [Z87.81] 04/12/2016  . Hip fracture (  [place]) [S72.009A] 04/12/2016  . Fall [W19.XXXA]   . Itching in the vaginal area [N89.8] 10/05/2014  . Chronic myelogenous leukemia (CML), BCR-ABL1-positive ([place]) [C92.10]   . GERD (gastroesophageal reflux disease) [K21.9] 12/06/2013  . Leukocytosis [D72.829] 08/06/2013  . Atypical chest pain [R07.89] 08/06/2013  . Chronic diarrhea [K52.9] 08/05/2013  . Pessary maintenance [Z46.89] 07/01/2013    Past Medical History Past Medical History:  Diagnosis Date  . Arthritis    stiff knees  . Blood dyscrasia   . Cancer ([place])   . CVA (cerebral vascular accident) ([place])    . GERD (gastroesophageal reflux disease)   . H/O: CML (chronic myeloid leukemia)   . Hyperlipidemia   . Hyperlipidemia   . Hypertension   . Obesity   . Pessary maintenance 07/01/2013  . Stroke ([place])    x 3    Past Surgical History Past Surgical History:  Procedure Laterality Date  . ABDOMINAL HYSTERECTOMY    . CHOLECYSTECTOMY    . COLONOSCOPY  06/09/2002   WUX:[id number] hemorrhoids; otherwise normal rectum, colon   . COLONOSCOPY N/A 08/25/2013   VOZ:DGUYQIH diverticulosis. Single colonic polyp-removed  s/p segmental biopsy and stool sample. random colon bx negative. +benign leiomyoma.  . cystocele/rectocele repair  2009  . ESOPHAGOGASTRODUODENOSCOPY  06/09/2002   KVQ:[id number] upper gastrointestinal tract s/p  54-French Maloney dilator  . FEMUR IM NAIL Right 04/12/2016   Procedure: INTRAMEDULLARY (IM) NAIL FEMORAL;  Surgeon: Nicholes Stairs, MD;  Location: [facility];  Service: Orthopedics;  Laterality: Right;  . HERNIA REPAIR    . LOOP RECORDER INSERTION N/A 08/23/2016   Procedure: Loop Recorder Insertion;  Surgeon: Constance Haw, MD;  Location: [facility];  Service: Cardiovascular;  Laterality: N/A;  . OOPHORECTOMY    . TEE WITHOUT CARDIOVERSION N/A 08/23/2016   Procedure: TRANSESOPHAGEAL ECHOCARDIOGRAM (TEE);  Surgeon: Fay Records, MD;  Location: [facility] ENDOSCOPY;  Service: Cardiovascular;  Laterality: N/A;    Family History Family History  Problem Relation Age of Onset  . Anuerysm Mother        deceased age 38, brain anuerysm  . Early death Mother 26  . Heart disease Father   . Hypertension Sister   . Obesity Brother   . Hypertension Sister   . Arthritis Sister   . Other Son        cardiac arrest  . Heart disease Child 62       cardiac arrest  . Colon cancer Neg Hx      Social History  reports that  has never smoked. she has never used smokeless tobacco. She reports that she does not drink alcohol or use drugs.  Medications  Current Outpatient  Medications:  .  acetaminophen (TYLENOL) 325 MG tablet, Take 2 tablets (650 mg total) by mouth every 4 (four) hours as needed for mild pain (or temp > 37.5 C (99.5 F))., Disp: , Rfl:  .  aspirin 325 MG tablet, Take 1 tablet (325 mg total) by mouth daily., Disp: 30 tablet, Rfl: 4 .  atorvastatin (LIPITOR) 40 MG tablet, Take 1 tablet (40 mg total) by mouth daily at 6 PM., Disp: 30 tablet, Rfl: 0 .  dasatinib (SPRYCEL) 100 MG tablet, Take 1 tablet (100 mg total) by mouth daily., Disp: 30 tablet, Rfl: 1 .  FLUoxetine (PROZAC) 20 MG capsule, TK 1 C PO QD, Disp: , Rfl: 2 .  losartan (COZAAR) 100 MG tablet, Take 1 tablet (100 mg total) by mouth daily. Resume on 3/28, Disp: 30 tablet,  Rfl: 0 .  Multiple Vitamin (MULTIVITAMIN WITH MINERALS) TABS tablet, Take 1 tablet by mouth daily., Disp: , Rfl:  .  ondansetron (ZOFRAN) 8 MG tablet, Take 1 tablet (8 mg total) by mouth every 8 (eight) hours as needed for nausea or vomiting., Disp: 30 tablet, Rfl: 0 .  polyethylene glycol (MIRALAX / GLYCOLAX) packet, Take 17 g by mouth daily., Disp: 14 each, Rfl: 0  Allergies Patient has no known allergies.  Review of Systems Review of Systems - Oncology ROS as per HPI otherwise 12 point ROS is negative.   Physical Exam  Vitals Wt Readings from Last 3 Encounters:  05/27/17 163 lb 8 oz (74.2 kg)  04/07/17 168 lb (76.2 kg)  04/07/17 169 lb (76.7 kg)   Temp Readings from Last 3 Encounters:  05/27/17 98.2 F (36.8 C) (Oral)  04/07/17 98.3 F (36.8 C) (Oral)  03/26/17 98.6 F (37 C) (Oral)   BP Readings from Last 3 Encounters:  05/27/17 (!) 161/70  04/07/17 (!) 161/79  04/07/17 (!) 142/86   Pulse Readings from Last 3 Encounters:  05/27/17 71  04/07/17 77  04/07/17 (!) 104   Constitutional: Well-developed, well-nourished, and in no distress.   HENT: Head: Normocephalic and atraumatic.  Mouth/Throat: No oropharyngeal exudate. Mucosa moist. Eyes: Pupils are equal, round, and reactive to light.  Conjunctivae are normal. No scleral icterus.  Neck: Normal range of motion. Neck supple. No JVD present.  Cardiovascular: Normal rate, regular rhythm and normal heart sounds.  Exam reveals no gallop and no friction rub.   No murmur heard. Pulmonary/Chest: Effort normal and breath sounds normal. No respiratory distress. No wheezes.No rales.  Abdominal: Soft. Bowel sounds are normal. No distension. There is no tenderness. There is no guarding.  Musculoskeletal: No edema or tenderness.  Lymphadenopathy: No cervical, axillary or supraclavicular adenopathy.  Neurological: Alert and oriented to person, place, and time. No cranial nerve deficit.  Skin: Skin is warm and dry. No rash noted. No erythema. No pallor.  Psychiatric: Affect and judgment normal.   Labs Appointment on 05/27/2017  Component Date Value Ref Range Status  . WBC 05/27/2017 5.4  4.0 - 10.5 K/uL Final  . RBC 05/27/2017 4.08  3.87 - 5.11 MIL/uL Final  . Hemoglobin 05/27/2017 11.3* 12.0 - 15.0 g/dL Final  . HCT 05/27/2017 36.2  36.0 - 46.0 % Final  . MCV 05/27/2017 88.7  78.0 - 100.0 fL Final  . MCH 05/27/2017 27.7  26.0 - 34.0 pg Final  . MCHC 05/27/2017 31.2  30.0 - 36.0 g/dL Final  . RDW 05/27/2017 14.6  11.5 - 15.5 % Final  . Platelets 05/27/2017 199  150 - 400 K/uL Final  . Neutrophils Relative % 05/27/2017 52  % Final  . Neutro Abs 05/27/2017 2.8  1.7 - 7.7 K/uL Final  . Lymphocytes Relative 05/27/2017 39  % Final  . Lymphs Abs 05/27/2017 2.1  0.7 - 4.0 K/uL Final  . Monocytes Relative 05/27/2017 7  % Final  . Monocytes Absolute 05/27/2017 0.4  0.1 - 1.0 K/uL Final  . Eosinophils Relative 05/27/2017 1  % Final  . Eosinophils Absolute 05/27/2017 0.1  0.0 - 0.7 K/uL Final  . Basophils Relative 05/27/2017 1  % Final  . Basophils Absolute 05/27/2017 0.0  0.0 - 0.1 K/uL Final   Performed at [facility], [address], [place], [place] [address]  . Sodium 05/27/2017 141  135 - 145 mmol/L Final  . Potassium 05/27/2017 3.4*  3.5 - 5.1 mmol/L Final  .  Chloride 05/27/2017 106  101 - 111 mmol/L Final  . CO2 05/27/2017 25  22 - 32 mmol/L Final  . Glucose, Bld 05/27/2017 133* 65 - 99 mg/dL Final  . BUN 05/27/2017 19  6 - 20 mg/dL Final  . Creatinine, Ser 05/27/2017 1.06* 0.44 - 1.00 mg/dL Final  . Calcium 05/27/2017 9.2  8.9 - 10.3 mg/dL Final  . Total Protein 05/27/2017 7.1  6.5 - 8.1 g/dL Final  . Albumin 05/27/2017 3.8  3.5 - 5.0 g/dL Final  . AST 05/27/2017 23  15 - 41 U/L Final  . ALT 05/27/2017 19  14 - 54 U/L Final  . Alkaline Phosphatase 05/27/2017 119  38 - 126 U/L Final  . Total Bilirubin 05/27/2017 0.8  0.3 - 1.2 mg/dL Final  . GFR calc non Af Amer 05/27/2017 51* >60 mL/min Final  . GFR calc Af Amer 05/27/2017 59* >60 mL/min Final   Comment: (NOTE) The eGFR has been calculated using the CKD EPI equation. This calculation has not been validated in all clinical situations. eGFR's persistently <60 mL/min signify possible Chronic Kidney Disease.   Georgiann Hahn gap 05/27/2017 10  5 - 15 Final   Performed at [facility], [address], [place], [place] [address]  . LDH 05/27/2017 184  98 - 192 U/L Final   Performed at [facility], [address] [place], [place] [address]     Pathology Orders Placed This Encounter  Procedures  . CBC with Differential/Platelet    Standing Status:   Future    Standing Expiration Date:   05/28/2018  . Comprehensive metabolic panel    Standing Status:   Future    Standing Expiration Date:   05/28/2018  . Lactate dehydrogenase    Standing Status:   Future    Standing Expiration Date:   05/28/2018  . BCR-ABL1, CML/ALL, PCR, QUANT    Standing Status:   Future    Standing Expiration Date:   05/28/2018       Zoila Shutter MD

## 2017-06-05 DIAGNOSIS — Z6827 Body mass index (BMI) 27.0-27.9, adult: Secondary | ICD-10-CM | POA: Diagnosis not present

## 2017-06-05 DIAGNOSIS — E119 Type 2 diabetes mellitus without complications: Secondary | ICD-10-CM | POA: Diagnosis not present

## 2017-06-05 DIAGNOSIS — I10 Essential (primary) hypertension: Secondary | ICD-10-CM | POA: Diagnosis not present

## 2017-06-05 DIAGNOSIS — Z1389 Encounter for screening for other disorder: Secondary | ICD-10-CM | POA: Diagnosis not present

## 2017-06-05 DIAGNOSIS — K219 Gastro-esophageal reflux disease without esophagitis: Secondary | ICD-10-CM | POA: Diagnosis not present

## 2017-06-16 DIAGNOSIS — I63332 Cerebral infarction due to thrombosis of left posterior cerebral artery: Secondary | ICD-10-CM | POA: Diagnosis not present

## 2017-06-16 DIAGNOSIS — R471 Dysarthria and anarthria: Secondary | ICD-10-CM | POA: Diagnosis not present

## 2017-06-16 DIAGNOSIS — I69391 Dysphagia following cerebral infarction: Secondary | ICD-10-CM | POA: Diagnosis not present

## 2017-06-16 DIAGNOSIS — F482 Pseudobulbar affect: Secondary | ICD-10-CM | POA: Diagnosis not present

## 2017-06-24 ENCOUNTER — Other Ambulatory Visit (HOSPITAL_COMMUNITY): Payer: Medicare HMO

## 2017-06-24 ENCOUNTER — Other Ambulatory Visit (HOSPITAL_COMMUNITY): Payer: Self-pay | Admitting: Internal Medicine

## 2017-06-24 ENCOUNTER — Ambulatory Visit (HOSPITAL_COMMUNITY): Payer: Medicare HMO

## 2017-06-24 DIAGNOSIS — Z1231 Encounter for screening mammogram for malignant neoplasm of breast: Secondary | ICD-10-CM

## 2017-06-25 ENCOUNTER — Ambulatory Visit (INDEPENDENT_AMBULATORY_CARE_PROVIDER_SITE_OTHER): Payer: Medicare HMO | Admitting: *Deleted

## 2017-06-25 DIAGNOSIS — I639 Cerebral infarction, unspecified: Secondary | ICD-10-CM

## 2017-06-26 ENCOUNTER — Other Ambulatory Visit (HOSPITAL_COMMUNITY): Payer: Self-pay | Admitting: Emergency Medicine

## 2017-06-26 MED ORDER — DASATINIB 100 MG PO TABS: 100.0000 mg | ORAL_TABLET | Freq: Every day | ORAL | 3 refills | Status: DC

## 2017-06-26 NOTE — Progress Notes (Signed)
Carelink Summary Report / Loop Recorder 

## 2017-06-26 NOTE — Progress Notes (Signed)
sprycel refilled

## 2017-06-28 LAB — CUP PACEART REMOTE DEVICE CHECK
Date Time Interrogation Session: 20190316003954
MDC IDC PG IMPLANT DT: 20180615

## 2017-07-02 ENCOUNTER — Telehealth (HOSPITAL_COMMUNITY): Payer: Self-pay | Admitting: Internal Medicine

## 2017-07-02 NOTE — Telephone Encounter (Signed)
Faxed refill req form to [facility] and contacted pts daughter [name] to let her know

## 2017-07-03 ENCOUNTER — Telehealth (HOSPITAL_COMMUNITY): Payer: Self-pay | Admitting: Internal Medicine

## 2017-07-03 NOTE — Telephone Encounter (Signed)
PC TO [facility]K WITH LATIFA. SPRYCEL SET TO BE SENT AS AN URGENT ORDER BUT WAS NOT SENT OVER TO [facility] TO BE DELIVERED. PER LATIFA SHE WILL SEND IT OVER TO  [facility] TO BE SHIPPED TO PT

## 2017-07-09 ENCOUNTER — Ambulatory Visit (HOSPITAL_COMMUNITY)
Admission: RE | Admit: 2017-07-09 | Discharge: 2017-07-09 | Disposition: A | Discharge status: Home / Self Care | Payer: Medicare HMO | Source: Ambulatory Visit | Attending: Internal Medicine | Admitting: Internal Medicine

## 2017-07-09 ENCOUNTER — Encounter (HOSPITAL_COMMUNITY): Payer: Self-pay

## 2017-07-09 DIAGNOSIS — Z1231 Encounter for screening mammogram for malignant neoplasm of breast: Secondary | ICD-10-CM | POA: Diagnosis not present

## 2017-07-25 ENCOUNTER — Other Ambulatory Visit (HOSPITAL_COMMUNITY): Payer: Self-pay | Admitting: Internal Medicine

## 2017-07-25 DIAGNOSIS — Z1231 Encounter for screening mammogram for malignant neoplasm of breast: Secondary | ICD-10-CM

## 2017-07-25 LAB — CUP PACEART REMOTE DEVICE CHECK
MDC IDC PG IMPLANT DT: 20180615
MDC IDC SESS DTM: 20190418003640

## 2017-07-28 ENCOUNTER — Ambulatory Visit (INDEPENDENT_AMBULATORY_CARE_PROVIDER_SITE_OTHER): Payer: Medicare HMO | Admitting: *Deleted

## 2017-07-28 DIAGNOSIS — I639 Cerebral infarction, unspecified: Secondary | ICD-10-CM | POA: Diagnosis not present

## 2017-07-29 NOTE — Progress Notes (Signed)
Carelink Summary Report / Loop Recorder 

## 2017-08-13 ENCOUNTER — Telehealth (HOSPITAL_COMMUNITY): Payer: Self-pay | Admitting: Hematology

## 2017-08-13 ENCOUNTER — Other Ambulatory Visit (HOSPITAL_COMMUNITY): Payer: Self-pay

## 2017-08-13 DIAGNOSIS — C9210 Chronic myeloid leukemia, BCR/ABL-positive, not having achieved remission: Secondary | ICD-10-CM

## 2017-08-13 MED ORDER — DASATINIB 100 MG PO TABS: 100.0000 mg | ORAL_TABLET | Freq: Every day | ORAL | 3 refills | Status: DC

## 2017-08-13 NOTE — Telephone Encounter (Signed)
FAXED SPRYCEL QM

## 2017-08-13 NOTE — Telephone Encounter (Signed)
Received refill request from patients pharmacy for Sprycel. Reviewed with provider, chart checked and refilled.

## 2017-08-19 LAB — CUP PACEART REMOTE DEVICE CHECK
Date Time Interrogation Session: 20190521013747
Implantable Pulse Generator Implant Date: 20180615

## 2017-08-20 ENCOUNTER — Inpatient Hospital Stay (HOSPITAL_COMMUNITY): Payer: Medicare HMO | Attending: Hematology

## 2017-08-20 DIAGNOSIS — C9210 Chronic myeloid leukemia, BCR/ABL-positive, not having achieved remission: Secondary | ICD-10-CM

## 2017-08-20 DIAGNOSIS — Z9221 Personal history of antineoplastic chemotherapy: Secondary | ICD-10-CM | POA: Insufficient documentation

## 2017-08-20 DIAGNOSIS — M1990 Unspecified osteoarthritis, unspecified site: Secondary | ICD-10-CM | POA: Insufficient documentation

## 2017-08-20 DIAGNOSIS — R21 Rash and other nonspecific skin eruption: Secondary | ICD-10-CM | POA: Diagnosis not present

## 2017-08-20 DIAGNOSIS — C9211 Chronic myeloid leukemia, BCR/ABL-positive, in remission: Secondary | ICD-10-CM | POA: Insufficient documentation

## 2017-08-20 DIAGNOSIS — K219 Gastro-esophageal reflux disease without esophagitis: Secondary | ICD-10-CM | POA: Insufficient documentation

## 2017-08-20 DIAGNOSIS — Z79899 Other long term (current) drug therapy: Secondary | ICD-10-CM | POA: Diagnosis not present

## 2017-08-20 DIAGNOSIS — Z7982 Long term (current) use of aspirin: Secondary | ICD-10-CM | POA: Diagnosis not present

## 2017-08-20 DIAGNOSIS — E785 Hyperlipidemia, unspecified: Secondary | ICD-10-CM | POA: Insufficient documentation

## 2017-08-20 DIAGNOSIS — I10 Essential (primary) hypertension: Secondary | ICD-10-CM | POA: Diagnosis not present

## 2017-08-20 DIAGNOSIS — Z8673 Personal history of transient ischemic attack (TIA), and cerebral infarction without residual deficits: Secondary | ICD-10-CM | POA: Insufficient documentation

## 2017-08-20 LAB — CBC WITH DIFFERENTIAL/PLATELET
BASOS PCT: 0 %
Basophils Absolute: 0 10*3/uL (ref 0.0–0.1)
Eosinophils Absolute: 0.1 10*3/uL (ref 0.0–0.7)
Eosinophils Relative: 2 %
HEMATOCRIT: 35.9 % — AB (ref 36.0–46.0)
HEMOGLOBIN: 11.3 g/dL — AB (ref 12.0–15.0)
LYMPHS ABS: 1.8 10*3/uL (ref 0.7–4.0)
Lymphocytes Relative: 40 %
MCH: 28.3 pg (ref 26.0–34.0)
MCHC: 31.5 g/dL (ref 30.0–36.0)
MCV: 89.8 fL (ref 78.0–100.0)
MONOS PCT: 14 %
Monocytes Absolute: 0.7 10*3/uL (ref 0.1–1.0)
NEUTROS ABS: 2.1 10*3/uL (ref 1.7–7.7)
NEUTROS PCT: 44 %
Platelets: 238 10*3/uL (ref 150–400)
RBC: 4 MIL/uL (ref 3.87–5.11)
RDW: 15.1 % (ref 11.5–15.5)
WBC: 4.6 10*3/uL (ref 4.0–10.5)

## 2017-08-20 LAB — COMPREHENSIVE METABOLIC PANEL
ALBUMIN: 3.9 g/dL (ref 3.5–5.0)
ALK PHOS: 128 U/L — AB (ref 38–126)
ALT: 23 U/L (ref 14–54)
ANION GAP: 9 (ref 5–15)
AST: 29 U/L (ref 15–41)
BUN: 20 mg/dL (ref 6–20)
CALCIUM: 9.9 mg/dL (ref 8.9–10.3)
CO2: 28 mmol/L (ref 22–32)
CREATININE: 1.07 mg/dL — AB (ref 0.44–1.00)
Chloride: 105 mmol/L (ref 101–111)
GFR calc Af Amer: 59 mL/min — ABNORMAL LOW (ref >60)
GFR calc non Af Amer: 51 mL/min — ABNORMAL LOW (ref >60)
GLUCOSE: 132 mg/dL — AB (ref 65–99)
Potassium: 3.5 mmol/L (ref 3.5–5.1)
Sodium: 142 mmol/L (ref 135–145)
Total Bilirubin: 0.6 mg/dL (ref 0.3–1.2)
Total Protein: 7.5 g/dL (ref 6.5–8.1)

## 2017-08-20 LAB — LACTATE DEHYDROGENASE: LDH: 207 U/L — ABNORMAL HIGH (ref 98–192)

## 2017-08-26 LAB — BCR-ABL1, CML/ALL, PCR, QUANT

## 2017-08-27 ENCOUNTER — Inpatient Hospital Stay (HOSPITAL_BASED_OUTPATIENT_CLINIC_OR_DEPARTMENT_OTHER): Payer: Medicare HMO | Admitting: Hematology

## 2017-08-27 ENCOUNTER — Encounter (HOSPITAL_COMMUNITY): Payer: Self-pay | Admitting: Hematology

## 2017-08-27 ENCOUNTER — Other Ambulatory Visit: Payer: Self-pay

## 2017-08-27 VITALS — BP 170/64 | HR 62 | Temp 98.6°F | Resp 18 | Wt 154.0 lb

## 2017-08-27 DIAGNOSIS — Z7982 Long term (current) use of aspirin: Secondary | ICD-10-CM

## 2017-08-27 DIAGNOSIS — I10 Essential (primary) hypertension: Secondary | ICD-10-CM | POA: Diagnosis not present

## 2017-08-27 DIAGNOSIS — Z9221 Personal history of antineoplastic chemotherapy: Secondary | ICD-10-CM | POA: Diagnosis not present

## 2017-08-27 DIAGNOSIS — Z79899 Other long term (current) drug therapy: Secondary | ICD-10-CM | POA: Diagnosis not present

## 2017-08-27 DIAGNOSIS — R21 Rash and other nonspecific skin eruption: Secondary | ICD-10-CM | POA: Diagnosis not present

## 2017-08-27 DIAGNOSIS — E785 Hyperlipidemia, unspecified: Secondary | ICD-10-CM | POA: Diagnosis not present

## 2017-08-27 DIAGNOSIS — Z8673 Personal history of transient ischemic attack (TIA), and cerebral infarction without residual deficits: Secondary | ICD-10-CM

## 2017-08-27 DIAGNOSIS — C9211 Chronic myeloid leukemia, BCR/ABL-positive, in remission: Secondary | ICD-10-CM

## 2017-08-27 DIAGNOSIS — K219 Gastro-esophageal reflux disease without esophagitis: Secondary | ICD-10-CM

## 2017-08-27 DIAGNOSIS — M1990 Unspecified osteoarthritis, unspecified site: Secondary | ICD-10-CM | POA: Diagnosis not present

## 2017-08-27 NOTE — Progress Notes (Signed)
[place], [place] [address]   CLINIC:  Medical Oncology/Hematology  PCP:  [facility], [place] [address] [phone]   REASON FOR VISIT:  Follow-up for CML.  CURRENT THERAPY: Dasatinib 100 mg daily.  BRIEF ONCOLOGIC HISTORY:    Chronic myelogenous leukemia (CML), BCR-ABL1-positive ([place])   06/01/2014 Tumor Marker    Results for SKYLAH, DELAUTER (MRN [id number]) as of 12/02/2014 09:30  06/01/2014 09:59 BCR ABL1 / ABL1 IS: 71.220 (H)       07/14/2014 Bone Marrow Biopsy    Bone Marrow, Aspirate,Biopsy, and Clot, right iliac - HYPERCELLULAR BONE MARROW WITH CHRONIC MYELOGENOUS LEUKEMIA. - SEE COMMENT. PERIPHERAL BLOOD: - CHRONIC MYELOGENOUS LEUKEMIA. Diagnosis Note Previous PCR analysis performed for bcr-abl1 is report      07/21/2014 - 08/04/2014 Chemotherapy    Tasigna 300 mg BID      08/04/2014 Miscellaneous    Ran out of 2 week sample secondary to issues with insurance approval.      08/15/2014 -  Chemotherapy    Tasigna 300 mg BID      09/17/2014 Tumor Marker    b3a2 transcript % 4.122       12/02/2014 Tumor Marker    b3a2 transcript: 0.046       03/31/2015 Tumor Marker    b3a2 transcript:  <0.001 %         03/31/2015 Remission    Complete molecular response, no detectable BCR/ABL using PCR assay      05/24/2016 Remission    NEGATIVE for the BCR-ABL1 e1a2 (p190), e13a2 (b2a2, p210) and e14a2 (b3a2, p210) fusion transcripts.        CANCER STAGING: Cancer Staging No matching staging information was found for the patient.   INTERVAL HISTORY:  April Page 72 y.o. female returns for follow-up of CML.  She is tolerating dasatinib 100 mg daily without any major problems.  Denies any shortness of breath on exertion.  She walks with the help of walker.  Denies any GI side effects including nausea, vomiting, diarrhea or constipation.  Noticed some hyperpigmentation on the lower extremities and sparsely  on upper extremities since the start of the suddenly.  It is not itching.  No fevers or infections were reported.  Energy levels reported as 100%.   REVIEW OF SYSTEMS:  Review of Systems  Skin: Positive for rash.  All other systems reviewed and are negative.    PAST MEDICAL/SURGICAL HISTORY:  Past Medical History:  Diagnosis Date  . Arthritis    stiff knees  . Blood dyscrasia   . Cancer ([place])   . CVA (cerebral vascular accident) ([place])   . GERD (gastroesophageal reflux disease)   . H/O: CML (chronic myeloid leukemia)   . Hyperlipidemia   . Hyperlipidemia   . Hypertension   . Obesity   . Pessary maintenance 07/01/2013  . Stroke ([place])    x 3   Past Surgical History:  Procedure Laterality Date  . ABDOMINAL HYSTERECTOMY    . CHOLECYSTECTOMY    . COLONOSCOPY  06/09/2002   MVH:QIONGEXB hemorrhoids; otherwise normal rectum, colon   . COLONOSCOPY N/A 08/25/2013   MWU:XLKGMWN diverticulosis. Single colonic polyp-removed  s/p segmental biopsy and stool sample. random colon bx negative. +benign leiomyoma.  . cystocele/rectocele repair  2009  . ESOPHAGOGASTRODUODENOSCOPY  06/09/2002   UUV:OZDGUY upper gastrointestinal tract s/p  54-French Maloney dilator  . FEMUR IM NAIL Right 04/12/2016   Procedure: INTRAMEDULLARY (IM) NAIL FEMORAL;  Surgeon:  Nicholes Stairs, MD;  Location: [facility];  Service: Orthopedics;  Laterality: Right;  . HERNIA REPAIR    . LOOP RECORDER INSERTION N/A 08/23/2016   Procedure: Loop Recorder Insertion;  Surgeon: Constance Haw, MD;  Location: [facility];  Service: Cardiovascular;  Laterality: N/A;  . OOPHORECTOMY    . TEE WITHOUT CARDIOVERSION N/A 08/23/2016   Procedure: TRANSESOPHAGEAL ECHOCARDIOGRAM (TEE);  Surgeon: Fay Records, MD;  Location: [name] ENDOSCOPY;  Service: Cardiovascular;  Laterality: N/A;     SOCIAL HISTORY:  Social History   Socioeconomic History  . Marital status: Widowed    Spouse name: Not on file  . Number of children: 6    . Years of education: associate  . Highest education level: Not on file  Occupational History    Employer: RETIRED  Social Needs  . Financial resource strain: Not on file  . Food insecurity:    Worry: Not on file    Inability: Not on file  . Transportation needs:    Medical: Not on file    Non-medical: Not on file  Tobacco Use  . Smoking status: Never Smoker  . Smokeless tobacco: Never Used  Substance and Sexual Activity  . Alcohol use: No  . Drug use: No  . Sexual activity: Not Currently    Birth control/protection: Surgical    Comment: hyst  Lifestyle  . Physical activity:    Days per week: Not on file    Minutes per session: Not on file  . Stress: Not on file  Relationships  . Social connections:    Talks on phone: Not on file    Gets together: Not on file    Attends religious service: Not on file    Active member of club or organization: Not on file    Attends meetings of clubs or organizations: Not on file    Relationship status: Not on file  . Intimate partner violence:    Fear of current or ex partner: Not on file    Emotionally abused: Not on file    Physically abused: Not on file    Forced sexual activity: Not on file  Other Topics Concern  . Not on file  Social History Narrative  . Not on file    FAMILY HISTORY:  Family History  Problem Relation Age of Onset  . Anuerysm Mother        deceased age 11, brain anuerysm  . Early death Mother 17  . Heart disease Father   . Hypertension Sister   . Obesity Brother   . Hypertension Sister   . Arthritis Sister   . Other Son        cardiac arrest  . Heart disease Child 19       cardiac arrest  . Colon cancer Neg Hx     CURRENT MEDICATIONS:  Outpatient Encounter Medications as of 08/27/2017  Medication Sig  . acetaminophen (TYLENOL) 325 MG tablet Take 2 tablets (650 mg total) by mouth every 4 (four) hours as needed for mild pain (or temp > 37.5 C (99.5 F)).  Marland Kitchen aspirin 325 MG tablet Take 1 tablet (325  mg total) by mouth daily.  Marland Kitchen atorvastatin (LIPITOR) 40 MG tablet Take 1 tablet (40 mg total) by mouth daily at 6 PM.  . dasatinib (SPRYCEL) 100 MG tablet Take 1 tablet (100 mg total) by mouth daily.  Marland Kitchen FLUoxetine (PROZAC) 20 MG capsule TK 1 C PO QD  . losartan (COZAAR) 100 MG  tablet Take 1 tablet (100 mg total) by mouth daily. Resume on 3/28  . Multiple Vitamin (MULTIVITAMIN WITH MINERALS) TABS tablet Take 1 tablet by mouth daily.  . polyethylene glycol (MIRALAX / GLYCOLAX) packet Take 17 g by mouth daily.  . [DISCONTINUED] ondansetron (ZOFRAN) 8 MG tablet Take 1 tablet (8 mg total) by mouth every 8 (eight) hours as needed for nausea or vomiting.   No facility-administered encounter medications on file as of 08/27/2017.     ALLERGIES:  No Known Allergies   PHYSICAL EXAM:  ECOG Performance status: 1  Vitals:   08/27/17 1525  BP: (!) 170/64  Pulse: 62  Resp: 18  Temp: 98.6 F (37 C)  SpO2: 99%   Filed Weights   08/27/17 1525  Weight: 154 lb (69.9 kg)    Physical Exam Hyperpigmented macular rash on the medial sides of both legs present.  LABORATORY DATA:  I have reviewed the labs as listed.  CBC    Component Value Date/Time   WBC 4.6 08/20/2017 1055   RBC 4.00 08/20/2017 1055   HGB 11.3 (L) 08/20/2017 1055   HCT 35.9 (L) 08/20/2017 1055   PLT 238 08/20/2017 1055   MCV 89.8 08/20/2017 1055   MCH 28.3 08/20/2017 1055   MCHC 31.5 08/20/2017 1055   RDW 15.1 08/20/2017 1055   LYMPHSABS 1.8 08/20/2017 1055   MONOABS 0.7 08/20/2017 1055   EOSABS 0.1 08/20/2017 1055   BASOSABS 0.0 08/20/2017 1055   CMP Latest Ref Rng & Units 08/20/2017 05/27/2017 03/26/2017  Glucose 65 - 99 mg/dL 132(H) 133(H) 154(H)  BUN 6 - 20 mg/dL 20 19 22(H)  Creatinine 0.44 - 1.00 mg/dL 1.07(H) 1.06(H) 1.03(H)  Sodium 135 - 145 mmol/L 142 141 142  Potassium 3.5 - 5.1 mmol/L 3.5 3.4(L) 3.7  Chloride 101 - 111 mmol/L 105 106 107  CO2 22 - 32 mmol/L '28 25 25  ' Calcium 8.9 - 10.3 mg/dL 9.9 9.2 9.8    Total Protein 6.5 - 8.1 g/dL 7.5 7.1 7.3  Total Bilirubin 0.3 - 1.2 mg/dL 0.6 0.8 0.6  Alkaline Phos 38 - 126 U/L 128(H) 119 101  AST 15 - 41 U/L '29 23 18  ' ALT 14 - 54 U/L '23 19 15          ' ASSESSMENT & PLAN:   Chronic myelogenous leukemia (CML), BCR-ABL1-positive ([facility]) 1.  CML in chronic phase: -Diagnosis in March 2016, was on Nilotinib 300 mg twice daily started in May 2016, discontinued in July 2018 secondary to recurrent strokes. - Dasatinib 100 mg daily started in February 2019, tolerating it very well. -RT-PCR on 08/20/2017 shows MR 4.5. -Slight hyperpigmented rash on the lower extremities thought to be secondary to dasatinib due to timing correlation. -No evidence of pleural effusion at this time.  I have warned the patient that if she develops any shortness of breath to call us immediately.  Otherwise I will see her back in 3 months with repeat BCR/ABL by quantitative PCR, CBC differential and CMP.      Orders placed this encounter:  Orders Placed This Encounter  Procedures  . BCR-ABL1, CML/ALL, PCR, QUANT  . CBC with Differential/Platelet  . Comprehensive metabolic panel      Derek Jack, MD [place] [phone]

## 2017-08-27 NOTE — Assessment & Plan Note (Signed)
1.  CML in chronic phase: -Diagnosis in March 2016, was on Nilotinib 300 mg twice daily started in May 2016, discontinued in July 2018 secondary to recurrent strokes. - Dasatinib 100 mg daily started in February 2019, tolerating it very well. -RT-PCR on 08/20/2017 shows MR 4.5. -Slight hyperpigmented rash on the lower extremities thought to be secondary to dasatinib due to timing correlation. -No evidence of pleural effusion at this time.  I have warned the patient that if she develops any shortness of breath to call us immediately.  Otherwise I will see her back in 3 months with repeat BCR/ABL by quantitative PCR, CBC differential and CMP.

## 2017-09-01 ENCOUNTER — Ambulatory Visit (INDEPENDENT_AMBULATORY_CARE_PROVIDER_SITE_OTHER): Payer: Medicare HMO | Admitting: *Deleted

## 2017-09-01 DIAGNOSIS — I639 Cerebral infarction, unspecified: Secondary | ICD-10-CM | POA: Diagnosis not present

## 2017-09-01 NOTE — Progress Notes (Signed)
Carelink Summary Report / Loop Recorder 

## 2017-10-02 ENCOUNTER — Ambulatory Visit (INDEPENDENT_AMBULATORY_CARE_PROVIDER_SITE_OTHER): Payer: Medicare HMO | Admitting: *Deleted

## 2017-10-02 DIAGNOSIS — I639 Cerebral infarction, unspecified: Secondary | ICD-10-CM

## 2017-10-03 NOTE — Progress Notes (Signed)
Carelink Summary Report / Loop Recorder 

## 2017-10-07 ENCOUNTER — Ambulatory Visit: Payer: Medicare HMO | Admitting: Adult Health

## 2017-10-08 LAB — CUP PACEART REMOTE DEVICE CHECK
Date Time Interrogation Session: 20190623020525
MDC IDC PG IMPLANT DT: 20180615

## 2017-11-04 ENCOUNTER — Ambulatory Visit: Payer: Medicare HMO | Admitting: Adult Health

## 2017-11-04 ENCOUNTER — Other Ambulatory Visit: Payer: Self-pay

## 2017-11-04 ENCOUNTER — Encounter (INDEPENDENT_AMBULATORY_CARE_PROVIDER_SITE_OTHER): Payer: Self-pay

## 2017-11-04 ENCOUNTER — Ambulatory Visit (INDEPENDENT_AMBULATORY_CARE_PROVIDER_SITE_OTHER): Payer: Medicare HMO | Admitting: *Deleted

## 2017-11-04 ENCOUNTER — Encounter: Payer: Self-pay | Admitting: Adult Health

## 2017-11-04 VITALS — BP 191/80 | HR 78 | Ht 66.0 in | Wt 158.0 lb

## 2017-11-04 DIAGNOSIS — N898 Other specified noninflammatory disorders of vagina: Secondary | ICD-10-CM

## 2017-11-04 DIAGNOSIS — Z4689 Encounter for fitting and adjustment of other specified devices: Secondary | ICD-10-CM | POA: Diagnosis not present

## 2017-11-04 DIAGNOSIS — N899 Noninflammatory disorder of vagina, unspecified: Secondary | ICD-10-CM | POA: Insufficient documentation

## 2017-11-04 DIAGNOSIS — N939 Abnormal uterine and vaginal bleeding, unspecified: Secondary | ICD-10-CM

## 2017-11-04 DIAGNOSIS — I639 Cerebral infarction, unspecified: Secondary | ICD-10-CM | POA: Diagnosis not present

## 2017-11-04 DIAGNOSIS — T8389XA Other specified complication of genitourinary prosthetic devices, implants and grafts, initial encounter: Secondary | ICD-10-CM | POA: Insufficient documentation

## 2017-11-04 MED ORDER — METRONIDAZOLE 0.75 % VA GEL: 1.0000 | Freq: Every day | VAGINAL | 1 refills | Status: DC

## 2017-11-04 NOTE — Progress Notes (Signed)
  Subjective:     Patient ID: April Page, female   DOB: [birth year], 72 y.o.   MRN: [id number]  HPI Gregory is a 72 year old black female in for pessary maintenance and has had vaginal bleeding for about a week, she is sp hysterectomy. She is in wheelchair today. PCP is Dr Gerarda Fraction.   Review of Systems +vaginal bleeding for about 2 weeks For pessary maintenance Reviewed past medical,surgical, social and family history. Reviewed medications and allergies.     Objective:   Physical Exam BP (!) 191/80 (BP Location: Left Arm, Patient Position: Sitting, Cuff Size: Normal)   Pulse 78   Ht 5\' 6"  (1.676 m)   Wt 158 lb (71.7 kg)   BMI 25.50 kg/m    Skin warm and dry, +red blood at introitus, pessary removed and cleaned with soap and water and dried, has odor, tissue looks irritated with +blood, Dr [name] in for Co exam, will leave pessary out and treat with Metrogel, and follow up in 1 week.  Assessment:     1. Vaginal irritation from pessary   2. Vaginal bleeding   3. Pessary maintenance       Plan:     Meds ordered this encounter  Medications  . metroNIDAZOLE (METROGEL) 0.75 % vaginal gel    Sig: Place 1 Applicatorful vaginally at bedtime.    Dispense:  70 g    Refill:  1    Order Specific Question:   Supervising Provider    Answer:   Tania Ade H [[id number]]  Use Metrogel for 1 week   Will leave pessary out F/U in 1 week and may insert pessary then

## 2017-11-05 NOTE — Progress Notes (Signed)
Carelink Summary Report / Loop Recorder 

## 2017-11-12 ENCOUNTER — Ambulatory Visit: Payer: Medicare HMO | Admitting: Adult Health

## 2017-11-12 ENCOUNTER — Encounter: Payer: Self-pay | Admitting: Adult Health

## 2017-11-12 VITALS — BP 168/82 | HR 69 | Ht 67.0 in | Wt 155.0 lb

## 2017-11-12 DIAGNOSIS — N899 Noninflammatory disorder of vagina, unspecified: Secondary | ICD-10-CM | POA: Diagnosis not present

## 2017-11-12 DIAGNOSIS — N898 Other specified noninflammatory disorders of vagina: Secondary | ICD-10-CM

## 2017-11-12 NOTE — Progress Notes (Addendum)
  Subjective:     Patient ID: April Page, female   DOB: 1946-02-21, 72 y.o.   MRN: [id number]  HPI April Page is a 72 year old black female, back in follow up of having vaginal spotting and irritation from pessary and it was left out and used Metrogel, and feels much better, no spotting or odor.   Review of Systems Feels better, no vaginal spotting now No vaginal odor  Reviewed past medical,surgical, social and family history. Reviewed medications and allergies.     Objective:   Physical Exam BP (!) 168/82 (BP Location: Right Arm, Patient Position: Sitting, Cuff Size: Normal)   Pulse 69   Ht 5\' 7"  (1.702 m)   Wt 155 lb (70.3 kg)   BMI 24.28 kg/m    Skin warm and dry.Pelvic: external genitalia is normal in appearance no lesions, vagina:pale, with loss of moisture and rugae, still has small area of irritation at about 5 o'clock, will leave pessary out for now,urethra has no lesions or masses noted, cervix and uterus are absent. If odor or spotting returns can use Metrogel again, Will recheck in 2 weeks, and if completely healed and she wants pessary reinserted will do then but I think she may be fine without it.  Examination chaperoned by Levy Pupa LPN.  Assessment:     1. Vaginal irritation from pessary       Plan:     F/U in about 2 weeks for recheck

## 2017-11-17 LAB — CUP PACEART REMOTE DEVICE CHECK
Implantable Pulse Generator Implant Date: 20180615
MDC IDC SESS DTM: 20190726024027

## 2017-11-18 ENCOUNTER — Other Ambulatory Visit (HOSPITAL_COMMUNITY): Payer: Medicare HMO

## 2017-11-25 ENCOUNTER — Ambulatory Visit (HOSPITAL_COMMUNITY): Payer: Medicare HMO | Admitting: Internal Medicine

## 2017-11-26 ENCOUNTER — Ambulatory Visit: Payer: Medicare HMO | Admitting: Adult Health

## 2017-11-26 ENCOUNTER — Inpatient Hospital Stay (HOSPITAL_COMMUNITY): Payer: Medicare HMO | Attending: Nurse Practitioner

## 2017-11-26 DIAGNOSIS — C9210 Chronic myeloid leukemia, BCR/ABL-positive, not having achieved remission: Secondary | ICD-10-CM | POA: Insufficient documentation

## 2017-11-26 DIAGNOSIS — C9211 Chronic myeloid leukemia, BCR/ABL-positive, in remission: Secondary | ICD-10-CM

## 2017-11-26 LAB — COMPREHENSIVE METABOLIC PANEL
ALBUMIN: 4 g/dL (ref 3.5–5.0)
ALK PHOS: 115 U/L (ref 38–126)
ALT: 28 U/L (ref 0–44)
AST: 31 U/L (ref 15–41)
Anion gap: 8 (ref 5–15)
BILIRUBIN TOTAL: 0.7 mg/dL (ref 0.3–1.2)
BUN: 16 mg/dL (ref 8–23)
CALCIUM: 9.3 mg/dL (ref 8.9–10.3)
CO2: 27 mmol/L (ref 22–32)
Chloride: 105 mmol/L (ref 98–111)
Creatinine, Ser: 1.08 mg/dL — ABNORMAL HIGH (ref 0.44–1.00)
GFR calc Af Amer: 58 mL/min — ABNORMAL LOW (ref >60)
GFR calc non Af Amer: 50 mL/min — ABNORMAL LOW (ref >60)
GLUCOSE: 93 mg/dL (ref 70–99)
Potassium: 4.2 mmol/L (ref 3.5–5.1)
SODIUM: 140 mmol/L (ref 135–145)
TOTAL PROTEIN: 7.7 g/dL (ref 6.5–8.1)

## 2017-11-26 LAB — CBC WITH DIFFERENTIAL/PLATELET
Basophils Absolute: 0 10*3/uL (ref 0.0–0.1)
Basophils Relative: 0 %
Eosinophils Absolute: 0.1 10*3/uL (ref 0.0–0.7)
Eosinophils Relative: 2 %
HCT: 35.1 % — ABNORMAL LOW (ref 36.0–46.0)
Hemoglobin: 11.3 g/dL — ABNORMAL LOW (ref 12.0–15.0)
Lymphocytes Relative: 52 %
Lymphs Abs: 2.4 10*3/uL (ref 0.7–4.0)
MCH: 29.5 pg (ref 26.0–34.0)
MCHC: 32.2 g/dL (ref 30.0–36.0)
MCV: 91.6 fL (ref 78.0–100.0)
Monocytes Absolute: 0.5 10*3/uL (ref 0.1–1.0)
Monocytes Relative: 10 %
Neutro Abs: 1.7 10*3/uL (ref 1.7–7.7)
Neutrophils Relative %: 36 %
Platelets: 191 10*3/uL (ref 150–400)
RBC: 3.83 MIL/uL — ABNORMAL LOW (ref 3.87–5.11)
RDW: 14.4 % (ref 11.5–15.5)
WBC: 4.6 10*3/uL (ref 4.0–10.5)

## 2017-11-28 LAB — BCR-ABL1, CML/ALL, PCR, QUANT

## 2017-12-02 LAB — CUP PACEART REMOTE DEVICE CHECK
MDC IDC PG IMPLANT DT: 20180615
MDC IDC SESS DTM: 20190828023605

## 2017-12-03 ENCOUNTER — Inpatient Hospital Stay (HOSPITAL_COMMUNITY): Payer: Medicare HMO

## 2017-12-03 ENCOUNTER — Encounter (HOSPITAL_COMMUNITY): Payer: Self-pay | Admitting: Internal Medicine

## 2017-12-03 ENCOUNTER — Inpatient Hospital Stay (HOSPITAL_COMMUNITY): Payer: Medicare HMO | Attending: Hematology | Admitting: Internal Medicine

## 2017-12-03 ENCOUNTER — Other Ambulatory Visit (HOSPITAL_COMMUNITY): Payer: Self-pay | Admitting: Internal Medicine

## 2017-12-03 VITALS — BP 181/72 | HR 62 | Temp 97.2°F | Resp 16 | Wt 148.1 lb

## 2017-12-03 DIAGNOSIS — I10 Essential (primary) hypertension: Secondary | ICD-10-CM | POA: Diagnosis not present

## 2017-12-03 DIAGNOSIS — C9210 Chronic myeloid leukemia, BCR/ABL-positive, not having achieved remission: Secondary | ICD-10-CM | POA: Insufficient documentation

## 2017-12-03 DIAGNOSIS — Z79899 Other long term (current) drug therapy: Secondary | ICD-10-CM | POA: Diagnosis not present

## 2017-12-03 DIAGNOSIS — Z8673 Personal history of transient ischemic attack (TIA), and cerebral infarction without residual deficits: Secondary | ICD-10-CM | POA: Insufficient documentation

## 2017-12-03 DIAGNOSIS — C9211 Chronic myeloid leukemia, BCR/ABL-positive, in remission: Secondary | ICD-10-CM

## 2017-12-03 DIAGNOSIS — Z9221 Personal history of antineoplastic chemotherapy: Secondary | ICD-10-CM | POA: Diagnosis not present

## 2017-12-03 NOTE — Patient Instructions (Signed)
[facility] Discharge Instructions  You saw Dr. Higgs today.   Thank you for choosing [facility] at [facility] to provide your oncology and hematology care.  To afford each patient quality time with our provider, please arrive at least 15 minutes before your scheduled appointment time.   If you have a lab appointment with the Cancer Center please come in thru the  Main Entrance and check in at the main information desk  You need to re-schedule your appointment should you arrive 10 or more minutes late.  We strive to give you quality time with our providers, and arriving late affects you and other patients whose appointments are after yours.  Also, if you no show three or more times for appointments you may be dismissed from the clinic at the providers discretion.     Again, thank you for choosing [facility].  Our hope is that these requests will decrease the amount of time that you wait before being seen by our physicians.       _____________________________________________________________  Should you have questions after your visit to [facility], please contact our office at [phone] between the hours of 8:00 a.m. and 4:30 p.m.  Voicemails left after 4:00 p.m. will not be returned until the following business day.  For prescription refill requests, have your pharmacy contact our office and allow 72 hours.    Cancer Center Support Programs:   > Cancer Support Group  2nd Tuesday of the month 1pm-2pm, Journey Room    

## 2017-12-03 NOTE — Progress Notes (Signed)
Diagnosis Chronic myeloid leukemia, BCR/ABL-positive, in remission ([place]) - Plan: CBC with Differential/Platelet, Comprehensive metabolic panel, Lactate dehydrogenase, BCR-ABL1, CML/ALL, PCR, QUANT  Staging Cancer Staging No matching staging information was found for the patient.  Assessment and Plan:  1.  Chronic myelogenous leukemia (CML), BCR-ABL1-positive ([place]) 1.  CML in chronic phase: -Diagnosis in March 2016, was on Nilotinib 300 mg twice daily started in May 2016, discontinued in July 2018 secondary to recurrent strokes. - Dasatinib 100 mg daily started in February 2019, tolerating it very well.  Labs done 11/26/2017 reviewed and showed WBC 4.6 HB 11.3 plts 191,000.  Chemistries WNL with K+ 4.2 Cr 1 and normal LFTs.  BCR/ABL 08/2017 was negative.  BCR/ABL pending.  If negative  Pt will continue Sprycel and RTC in 03/2018  for follow-up and repeat labs.  She denies any SOB.    2.  HTN.  BP is 181/72.  Follow-up with PCP for management.    Current Status:  Pt is seen today for follow-up.  She is here to go over labs.  She reports she is tolerating Sprycel without problems.       Chronic myelogenous leukemia (CML), BCR-ABL1-positive ([place])   06/01/2014 Tumor Marker    Results for JANIEL, DERHAMMER (MRN [id number]) as of 12/02/2014 09:30  06/01/2014 09:59 BCR ABL1 / ABL1 IS: 71.220 (H)     07/14/2014 Bone Marrow Biopsy    Bone Marrow, Aspirate,Biopsy, and Clot, right iliac - HYPERCELLULAR BONE MARROW WITH CHRONIC MYELOGENOUS LEUKEMIA. - SEE COMMENT. PERIPHERAL BLOOD: - CHRONIC MYELOGENOUS LEUKEMIA. Diagnosis Note Previous PCR analysis performed for bcr-abl1 is report    07/21/2014 - 08/04/2014 Chemotherapy    Tasigna 300 mg BID    08/04/2014 Miscellaneous    Ran out of 2 week sample secondary to issues with insurance approval.    08/15/2014 -  Chemotherapy    Tasigna 300 mg BID    09/17/2014 Tumor Marker    b3a2 transcript % 4.122     12/02/2014 Tumor Marker    b3a2 transcript: 0.046      03/31/2015 Tumor Marker    b3a2 transcript:  <0.001 %       03/31/2015 Remission    Complete molecular response, no detectable BCR/ABL using PCR assay    05/24/2016 Remission    NEGATIVE for the BCR-ABL1 e1a2 (p190), e13a2 (b2a2, p210) and e14a2 (b3a2, p210) fusion transcripts.      Problem List Patient Active Problem List   Diagnosis Date Noted  . Vaginal bleeding [N93.9] 11/04/2017  . Vaginal irritation from pessary [N89.9] 11/04/2017  . Controlled diabetes mellitus type 2 with complications ([place]) [F09.3] 08/26/2016  . Physical deconditioning [R53.81]   . Acute ischemic stroke ([place]) [I63.9] 08/20/2016  . Pressure injury of skin [L89.90] 08/17/2016  . Acute CVA (cerebrovascular accident) ([place]) [I63.9] 08/17/2016  . UTI (urinary tract infection) [N39.0] 08/16/2016  . Constipation [K59.00] 07/10/2016  . Dysphagia as late effect of cerebrovascular accident (CVA) [I69.391] 07/10/2016  . Loss of weight [R63.4] 07/10/2016  . Impaired glucose tolerance [R73.02] 06/05/2016  . Slurred speech [R47.81]   . Hypokalemia [E87.6] 04/13/2016  . Hyperglycemia [R73.9] 04/13/2016  . Essential hypertension [I10] 04/13/2016  . Hyperlipidemia [E78.5] 04/13/2016  . S/P right hip fracture [Z87.81] 04/12/2016  . Hip fracture ([place]) [S72.009A] 04/12/2016  . Fall [W19.XXXA]   . Itching in the vaginal area [N89.8] 10/05/2014  . Chronic myelogenous leukemia (CML), BCR-ABL1-positive ([place]) [C92.10]   . GERD (gastroesophageal reflux disease) [K21.9] 12/06/2013  . Leukocytosis [D72.829] 08/06/2013  .  Atypical chest pain [R07.89] 08/06/2013  . Chronic diarrhea [K52.9] 08/05/2013  . Pessary maintenance [Z46.89] 07/01/2013    Past Medical History Past Medical History:  Diagnosis Date  . Arthritis    stiff knees  . Blood dyscrasia   . Cancer ([place])   . CVA (cerebral vascular accident) ([place])   . GERD (gastroesophageal reflux disease)   . H/O: CML (chronic myeloid leukemia)   . Hyperlipidemia   .  Hyperlipidemia   . Hypertension   . Obesity   . Pessary maintenance 07/01/2013  . Stroke ([place])    x 3    Past Surgical History Past Surgical History:  Procedure Laterality Date  . ABDOMINAL HYSTERECTOMY    . CHOLECYSTECTOMY    . COLONOSCOPY  06/09/2002   HFW:YOVZCHYI hemorrhoids; otherwise normal rectum, colon   . COLONOSCOPY N/A 08/25/2013   FOY:DXAJOIN diverticulosis. Single colonic polyp-removed  s/p segmental biopsy and stool sample. random colon bx negative. +benign leiomyoma.  . cystocele/rectocele repair  2009  . ESOPHAGOGASTRODUODENOSCOPY  06/09/2002   OMV:EHMCNO upper gastrointestinal tract s/p  54-French Maloney dilator  . FEMUR IM NAIL Right 04/12/2016   Procedure: INTRAMEDULLARY (IM) NAIL FEMORAL;  Surgeon: Nicholes Stairs, MD;  Location: [facility];  Service: Orthopedics;  Laterality: Right;  . HERNIA REPAIR    . LOOP RECORDER INSERTION N/A 08/23/2016   Procedure: Loop Recorder Insertion;  Surgeon: Constance Haw, MD;  Location: [facility];  Service: Cardiovascular;  Laterality: N/A;  . OOPHORECTOMY    . TEE WITHOUT CARDIOVERSION N/A 08/23/2016   Procedure: TRANSESOPHAGEAL ECHOCARDIOGRAM (TEE);  Surgeon: Fay Records, MD;  Location: [facility];  Service: Cardiovascular;  Laterality: N/A;    Family History Family History  Problem Relation Age of Onset  . Anuerysm Mother        deceased age 34, brain anuerysm  . Early death Mother 45  . Heart disease Father   . Hypertension Sister   . Obesity Brother   . Hypertension Sister   . Arthritis Sister   . Other Son        cardiac arrest  . Heart disease Child 35       cardiac arrest  . Colon cancer Neg Hx      Social History  reports that she has never smoked. She has never used smokeless tobacco. She reports that she does not drink alcohol or use drugs.  Medications  Current Outpatient Medications:  .  acetaminophen (TYLENOL) 325 MG tablet, Take 2 tablets (650 mg total) by mouth every 4 (four)  hours as needed for mild pain (or temp > 37.5 C (99.5 F))., Disp: , Rfl:  .  aspirin 325 MG tablet, Take 1 tablet (325 mg total) by mouth daily., Disp: 30 tablet, Rfl: 4 .  atorvastatin (LIPITOR) 40 MG tablet, Take 1 tablet (40 mg total) by mouth daily at 6 PM., Disp: 30 tablet, Rfl: 0 .  dasatinib (SPRYCEL) 100 MG tablet, Take 1 tablet (100 mg total) by mouth daily., Disp: 30 tablet, Rfl: 3 .  FLUoxetine (PROZAC) 20 MG capsule, TK 1 C PO QD, Disp: , Rfl: 2 .  losartan (COZAAR) 100 MG tablet, Take 1 tablet (100 mg total) by mouth daily. Resume on 3/28, Disp: 30 tablet, Rfl: 0 .  Multiple Vitamin (MULTIVITAMIN WITH MINERALS) TABS tablet, Take 1 tablet by mouth daily., Disp: , Rfl:  .  polyethylene glycol (MIRALAX / GLYCOLAX) packet, Take 17 g by mouth daily. (Patient taking differently: Take 17 g by  mouth as needed. ), Disp: 14 each, Rfl: 0  Allergies Patient has no known allergies.  Review of Systems Review of Systems - Oncology ROS negative   Physical Exam  Vitals Wt Readings from Last 3 Encounters:  12/03/17 148 lb 1.6 oz (67.2 kg)  11/12/17 155 lb (70.3 kg)  11/04/17 158 lb (71.7 kg)   Temp Readings from Last 3 Encounters:  12/03/17 (!) 97.2 F (36.2 C) (Oral)  08/27/17 98.6 F (37 C) (Oral)  05/27/17 98.2 F (36.8 C) (Oral)   BP Readings from Last 3 Encounters:  12/03/17 (!) 181/72  11/12/17 (!) 168/82  11/04/17 (!) 191/80   Pulse Readings from Last 3 Encounters:  12/03/17 62  11/12/17 69  11/04/17 78   Constitutional: Well-developed, well-nourished, and in no distress.   HENT: Head: Normocephalic and atraumatic.  Mouth/Throat: No oropharyngeal exudate. Mucosa moist. Eyes: Pupils are equal, round, and reactive to light. Conjunctivae are normal. No scleral icterus.  Neck: Normal range of motion. Neck supple. No JVD present.  Cardiovascular: Normal rate, regular rhythm and normal heart sounds.  Exam reveals no gallop and no friction rub.   No murmur  heard. Pulmonary/Chest: Effort normal and breath sounds normal. No respiratory distress. No wheezes.No rales.  Abdominal: Soft. Bowel sounds are normal. No distension. There is no tenderness. There is no guarding.  Musculoskeletal: No edema or tenderness.  Lymphadenopathy: No cervical, axillary or supraclavicular adenopathy.  Neurological: Alert and oriented to person, place, and time. No cranial nerve deficit.  Skin: Skin is warm and dry. No rash noted. No erythema. No pallor.  Psychiatric: Affect and judgment normal.   Labs No visits with results within 3 Day(s) from this visit.  Latest known visit with results is:  Appointment on 11/26/2017  Component Date Value Ref Range Status  . b2a2 transcript 11/26/2017 REJ5  % Final   Comment: (NOTE) The specimen submitted does not meet the laboratory's criteria for acceptability. Refer to [employer] of Services for specimen acceptability criteria.      Required: Whole Blood or Bone Marrow      Received: Serum Gel      Hillary F. was notified 11/28/2017.   . Interpretation (BCRAL): 11/26/2017 NOT PERFORMED   Final   Test not performed  . WBC 11/26/2017 4.6  4.0 - 10.5 K/uL Final  . RBC 11/26/2017 3.83* 3.87 - 5.11 MIL/uL Final  . Hemoglobin 11/26/2017 11.3* 12.0 - 15.0 g/dL Final  . HCT 11/26/2017 35.1* 36.0 - 46.0 % Final  . MCV 11/26/2017 91.6  78.0 - 100.0 fL Final  . MCH 11/26/2017 29.5  26.0 - 34.0 pg Final  . MCHC 11/26/2017 32.2  30.0 - 36.0 g/dL Final  . RDW 11/26/2017 14.4  11.5 - 15.5 % Final  . Platelets 11/26/2017 191  150 - 400 K/uL Final  . Neutrophils Relative % 11/26/2017 36  % Final  . Neutro Abs 11/26/2017 1.7  1.7 - 7.7 K/uL Final  . Lymphocytes Relative 11/26/2017 52  % Final  . Lymphs Abs 11/26/2017 2.4  0.7 - 4.0 K/uL Final  . Monocytes Relative 11/26/2017 10  % Final  . Monocytes Absolute 11/26/2017 0.5  0.1 - 1.0 K/uL Final  . Eosinophils Relative 11/26/2017 2  % Final  . Eosinophils Absolute  11/26/2017 0.1  0.0 - 0.7 K/uL Final  . Basophils Relative 11/26/2017 0  % Final  . Basophils Absolute 11/26/2017 0.0  0.0 - 0.1 K/uL Final   Performed at [facility], [place]  [address] [place], [place] [address]  . Sodium 11/26/2017 140  135 - 145 mmol/L Final  . Potassium 11/26/2017 4.2  3.5 - 5.1 mmol/L Final  . Chloride 11/26/2017 105  98 - 111 mmol/L Final  . CO2 11/26/2017 27  22 - 32 mmol/L Final  . Glucose, Bld 11/26/2017 93  70 - 99 mg/dL Final  . BUN 11/26/2017 16  8 - 23 mg/dL Final  . Creatinine, Ser 11/26/2017 1.08* 0.44 - 1.00 mg/dL Final  . Calcium 11/26/2017 9.3  8.9 - 10.3 mg/dL Final  . Total Protein 11/26/2017 7.7  6.5 - 8.1 g/dL Final  . Albumin 11/26/2017 4.0  3.5 - 5.0 g/dL Final  . AST 11/26/2017 31  15 - 41 U/L Final  . ALT 11/26/2017 28  0 - 44 U/L Final  . Alkaline Phosphatase 11/26/2017 115  38 - 126 U/L Final  . Total Bilirubin 11/26/2017 0.7  0.3 - 1.2 mg/dL Final  . GFR calc non Af Amer 11/26/2017 50* >60 mL/min Final  . GFR calc Af Amer 11/26/2017 58* >60 mL/min Final   Comment: (NOTE) The eGFR has been calculated using the CKD EPI equation. This calculation has not been validated in all clinical situations. eGFR's persistently <60 mL/min signify possible Chronic Kidney Disease.   Georgiann Hahn gap 11/26/2017 8  5 - 15 Final   Performed at [facility], [address], [place], [place] [address]     Pathology Orders Placed This Encounter  Procedures  . BCR-ABL1, CML/ALL, PCR, QUANT  . CBC with Differential/Platelet    Standing Status:   Future    Standing Expiration Date:   12/04/2019  . Comprehensive metabolic panel    Standing Status:   Future    Standing Expiration Date:   12/04/2019  . Lactate dehydrogenase    Standing Status:   Future    Standing Expiration Date:   12/04/2019  . BCR-ABL1, CML/ALL, PCR, QUANT    Standing Status:   Future    Standing Expiration Date:   12/04/2019       Zoila Shutter MD

## 2017-12-08 ENCOUNTER — Ambulatory Visit (INDEPENDENT_AMBULATORY_CARE_PROVIDER_SITE_OTHER): Payer: Medicare HMO | Admitting: *Deleted

## 2017-12-08 DIAGNOSIS — I639 Cerebral infarction, unspecified: Secondary | ICD-10-CM

## 2017-12-08 NOTE — Progress Notes (Signed)
Carelink Summary Report / Loop Recorder 

## 2017-12-09 ENCOUNTER — Ambulatory Visit: Payer: Medicare HMO | Admitting: Adult Health

## 2017-12-09 LAB — BCR-ABL1, CML/ALL, PCR, QUANT

## 2017-12-10 LAB — CUP PACEART REMOTE DEVICE CHECK
Date Time Interrogation Session: 20190930073942
MDC IDC PG IMPLANT DT: 20180615

## 2017-12-11 ENCOUNTER — Ambulatory Visit: Payer: Medicare HMO | Admitting: Adult Health

## 2017-12-29 ENCOUNTER — Other Ambulatory Visit (HOSPITAL_COMMUNITY): Payer: Self-pay | Admitting: *Deleted

## 2017-12-29 DIAGNOSIS — C9210 Chronic myeloid leukemia, BCR/ABL-positive, not having achieved remission: Secondary | ICD-10-CM

## 2017-12-29 MED ORDER — DASATINIB 100 MG PO TABS: 100.0000 mg | ORAL_TABLET | Freq: Every day | ORAL | 3 refills | Status: DC

## 2017-12-29 NOTE — Telephone Encounter (Signed)
Chart reviewed, sprycel refilled.

## 2017-12-30 ENCOUNTER — Encounter: Payer: Self-pay | Admitting: Adult Health

## 2017-12-30 ENCOUNTER — Ambulatory Visit: Payer: Medicare HMO | Admitting: Adult Health

## 2017-12-30 VITALS — BP 159/82 | HR 68

## 2017-12-30 DIAGNOSIS — N899 Noninflammatory disorder of vagina, unspecified: Secondary | ICD-10-CM

## 2017-12-30 DIAGNOSIS — N898 Other specified noninflammatory disorders of vagina: Secondary | ICD-10-CM

## 2017-12-30 NOTE — Progress Notes (Signed)
  Subjective:     Patient ID: April Page, female   DOB: 05-01-1945, 72 y.o.   MRN: [id number]  HPI  April Page is a 72 year old black female back in follow up of having vaginal spotting and irritation from pessary and she has left it out and used Metrogel and has not had any further spotting or discharge.Daughter is with her.She is using wheelchair.    Review of Systems No bleeding or discharge   Reviewed past medical,surgical, social and family history. Reviewed medications and allergies.     Objective:   Physical Exam BP (!) 159/82 (BP Location: Right Arm, Patient Position: Sitting, Cuff Size: Normal)   Pulse 68 She declines height and weight  today.   Skin warm and dry.Pelvic: external genitalia is normal in appearance no lesions, vagina: pale, with loss of moisture and rugae, no red areas noted today, urethra has no lesions or masses noted, cervix and uterus are absent, adnexa: no masses or tenderness noted. Bladder is non tender and no masses felt.  She says she is good and will leave pessary out.  Assessment:     1. Vaginal irritation from pessary       Plan:     Will leave pessary out F/U prn

## 2018-01-12 ENCOUNTER — Ambulatory Visit (INDEPENDENT_AMBULATORY_CARE_PROVIDER_SITE_OTHER): Payer: Medicare HMO | Admitting: *Deleted

## 2018-01-12 DIAGNOSIS — I639 Cerebral infarction, unspecified: Secondary | ICD-10-CM | POA: Diagnosis not present

## 2018-01-12 NOTE — Progress Notes (Signed)
Carelink Summary Report / Loop Recorder 

## 2018-01-15 ENCOUNTER — Telehealth (HOSPITAL_COMMUNITY): Payer: Self-pay | Admitting: Internal Medicine

## 2018-02-12 ENCOUNTER — Ambulatory Visit (INDEPENDENT_AMBULATORY_CARE_PROVIDER_SITE_OTHER): Payer: Medicare HMO

## 2018-02-12 ENCOUNTER — Other Ambulatory Visit (HOSPITAL_COMMUNITY): Payer: Self-pay | Admitting: Emergency Medicine

## 2018-02-12 DIAGNOSIS — I63532 Cerebral infarction due to unspecified occlusion or stenosis of left posterior cerebral artery: Secondary | ICD-10-CM

## 2018-02-12 DIAGNOSIS — I639 Cerebral infarction, unspecified: Secondary | ICD-10-CM

## 2018-02-12 DIAGNOSIS — C9210 Chronic myeloid leukemia, BCR/ABL-positive, not having achieved remission: Secondary | ICD-10-CM

## 2018-02-12 MED ORDER — DASATINIB 100 MG PO TABS: 100.0000 mg | ORAL_TABLET | Freq: Every day | ORAL | 11 refills | Status: DC

## 2018-02-12 NOTE — Progress Notes (Signed)
Carelink Summary Report / Loop Recorder 

## 2018-02-27 ENCOUNTER — Telehealth (HOSPITAL_COMMUNITY): Payer: Self-pay | Admitting: Hematology

## 2018-03-07 LAB — CUP PACEART REMOTE DEVICE CHECK
Date Time Interrogation Session: 20191102073604
MDC IDC PG IMPLANT DT: 20180615

## 2018-03-17 ENCOUNTER — Ambulatory Visit (INDEPENDENT_AMBULATORY_CARE_PROVIDER_SITE_OTHER): Payer: Medicare HMO

## 2018-03-17 DIAGNOSIS — I639 Cerebral infarction, unspecified: Secondary | ICD-10-CM

## 2018-03-18 LAB — CUP PACEART REMOTE DEVICE CHECK
Date Time Interrogation Session: 20200107134059
Implantable Pulse Generator Implant Date: 20180615

## 2018-03-18 NOTE — Progress Notes (Signed)
Carelink Summary Report / Loop Recorder 

## 2018-03-27 ENCOUNTER — Telehealth (HOSPITAL_COMMUNITY): Payer: Self-pay | Admitting: Internal Medicine

## 2018-03-27 NOTE — Telephone Encounter (Signed)
BMSPAF PENDING PT FIN INFO.

## 2018-03-29 LAB — CUP PACEART REMOTE DEVICE CHECK
MDC IDC PG IMPLANT DT: 20180615
MDC IDC SESS DTM: 20191205131059

## 2018-03-31 ENCOUNTER — Other Ambulatory Visit (HOSPITAL_COMMUNITY): Payer: Medicare HMO

## 2018-04-03 ENCOUNTER — Ambulatory Visit (HOSPITAL_COMMUNITY): Payer: Medicare HMO | Admitting: Internal Medicine

## 2018-04-07 ENCOUNTER — Ambulatory Visit (HOSPITAL_COMMUNITY): Payer: Medicare HMO | Admitting: Hematology

## 2018-04-14 DIAGNOSIS — Z0001 Encounter for general adult medical examination with abnormal findings: Secondary | ICD-10-CM | POA: Diagnosis not present

## 2018-04-14 DIAGNOSIS — E118 Type 2 diabetes mellitus with unspecified complications: Secondary | ICD-10-CM | POA: Diagnosis not present

## 2018-04-14 DIAGNOSIS — E119 Type 2 diabetes mellitus without complications: Secondary | ICD-10-CM | POA: Diagnosis not present

## 2018-04-14 DIAGNOSIS — Z1389 Encounter for screening for other disorder: Secondary | ICD-10-CM | POA: Diagnosis not present

## 2018-04-14 DIAGNOSIS — E7849 Other hyperlipidemia: Secondary | ICD-10-CM | POA: Diagnosis not present

## 2018-04-14 DIAGNOSIS — Z6827 Body mass index (BMI) 27.0-27.9, adult: Secondary | ICD-10-CM | POA: Diagnosis not present

## 2018-04-14 DIAGNOSIS — I10 Essential (primary) hypertension: Secondary | ICD-10-CM | POA: Diagnosis not present

## 2018-04-14 DIAGNOSIS — Z0000 Encounter for general adult medical examination without abnormal findings: Secondary | ICD-10-CM | POA: Diagnosis not present

## 2018-04-14 DIAGNOSIS — E11319 Type 2 diabetes mellitus with unspecified diabetic retinopathy without macular edema: Secondary | ICD-10-CM | POA: Diagnosis not present

## 2018-04-20 ENCOUNTER — Ambulatory Visit (INDEPENDENT_AMBULATORY_CARE_PROVIDER_SITE_OTHER): Payer: Medicare HMO

## 2018-04-20 DIAGNOSIS — I639 Cerebral infarction, unspecified: Secondary | ICD-10-CM

## 2018-04-21 LAB — CUP PACEART REMOTE DEVICE CHECK
Date Time Interrogation Session: 20200209144203
MDC IDC PG IMPLANT DT: 20180615

## 2018-05-01 NOTE — Progress Notes (Signed)
Carelink Summary Report / Loop Recorder 

## 2018-05-06 ENCOUNTER — Encounter (HOSPITAL_COMMUNITY): Payer: Self-pay

## 2018-05-06 ENCOUNTER — Inpatient Hospital Stay (HOSPITAL_COMMUNITY)
Admission: EM | Admit: 2018-05-06 | Discharge: 2018-05-08 | DRG: 243 | Disposition: A | Discharge status: Home Health | Payer: Medicare HMO | Attending: Internal Medicine | Admitting: Internal Medicine

## 2018-05-06 ENCOUNTER — Emergency Department (HOSPITAL_COMMUNITY): Payer: Medicare HMO

## 2018-05-06 DIAGNOSIS — R001 Bradycardia, unspecified: Secondary | ICD-10-CM | POA: Diagnosis present

## 2018-05-06 DIAGNOSIS — I441 Atrioventricular block, second degree: Secondary | ICD-10-CM | POA: Diagnosis not present

## 2018-05-06 DIAGNOSIS — L89152 Pressure ulcer of sacral region, stage 2: Secondary | ICD-10-CM | POA: Diagnosis present

## 2018-05-06 DIAGNOSIS — Z8249 Family history of ischemic heart disease and other diseases of the circulatory system: Secondary | ICD-10-CM

## 2018-05-06 DIAGNOSIS — J90 Pleural effusion, not elsewhere classified: Secondary | ICD-10-CM | POA: Diagnosis present

## 2018-05-06 DIAGNOSIS — Z95818 Presence of other cardiac implants and grafts: Secondary | ICD-10-CM

## 2018-05-06 DIAGNOSIS — R159 Full incontinence of feces: Secondary | ICD-10-CM | POA: Diagnosis present

## 2018-05-06 DIAGNOSIS — N183 Chronic kidney disease, stage 3 (moderate): Secondary | ICD-10-CM | POA: Diagnosis not present

## 2018-05-06 DIAGNOSIS — I6389 Other cerebral infarction: Secondary | ICD-10-CM | POA: Diagnosis not present

## 2018-05-06 DIAGNOSIS — E860 Dehydration: Secondary | ICD-10-CM | POA: Diagnosis not present

## 2018-05-06 DIAGNOSIS — E1122 Type 2 diabetes mellitus with diabetic chronic kidney disease: Secondary | ICD-10-CM | POA: Diagnosis present

## 2018-05-06 DIAGNOSIS — K219 Gastro-esophageal reflux disease without esophagitis: Secondary | ICD-10-CM | POA: Diagnosis present

## 2018-05-06 DIAGNOSIS — Z8673 Personal history of transient ischemic attack (TIA), and cerebral infarction without residual deficits: Secondary | ICD-10-CM | POA: Diagnosis not present

## 2018-05-06 DIAGNOSIS — N179 Acute kidney failure, unspecified: Secondary | ICD-10-CM

## 2018-05-06 DIAGNOSIS — Z9889 Other specified postprocedural states: Secondary | ICD-10-CM | POA: Diagnosis not present

## 2018-05-06 DIAGNOSIS — Z7982 Long term (current) use of aspirin: Secondary | ICD-10-CM

## 2018-05-06 DIAGNOSIS — Z4682 Encounter for fitting and adjustment of non-vascular catheter: Secondary | ICD-10-CM | POA: Diagnosis not present

## 2018-05-06 DIAGNOSIS — C9211 Chronic myeloid leukemia, BCR/ABL-positive, in remission: Secondary | ICD-10-CM | POA: Diagnosis present

## 2018-05-06 DIAGNOSIS — Z8261 Family history of arthritis: Secondary | ICD-10-CM | POA: Diagnosis not present

## 2018-05-06 DIAGNOSIS — D631 Anemia in chronic kidney disease: Secondary | ICD-10-CM | POA: Diagnosis present

## 2018-05-06 DIAGNOSIS — R846 Abnormal cytological findings in specimens from respiratory organs and thorax: Secondary | ICD-10-CM | POA: Diagnosis not present

## 2018-05-06 DIAGNOSIS — R918 Other nonspecific abnormal finding of lung field: Secondary | ICD-10-CM | POA: Diagnosis not present

## 2018-05-06 DIAGNOSIS — I129 Hypertensive chronic kidney disease with stage 1 through stage 4 chronic kidney disease, or unspecified chronic kidney disease: Secondary | ICD-10-CM | POA: Diagnosis not present

## 2018-05-06 DIAGNOSIS — R9431 Abnormal electrocardiogram [ECG] [EKG]: Secondary | ICD-10-CM | POA: Diagnosis not present

## 2018-05-06 DIAGNOSIS — Z9221 Personal history of antineoplastic chemotherapy: Secondary | ICD-10-CM | POA: Diagnosis not present

## 2018-05-06 DIAGNOSIS — J9811 Atelectasis: Secondary | ICD-10-CM | POA: Diagnosis not present

## 2018-05-06 DIAGNOSIS — E785 Hyperlipidemia, unspecified: Secondary | ICD-10-CM | POA: Diagnosis present

## 2018-05-06 DIAGNOSIS — R7989 Other specified abnormal findings of blood chemistry: Secondary | ICD-10-CM

## 2018-05-06 DIAGNOSIS — R778 Other specified abnormalities of plasma proteins: Secondary | ICD-10-CM

## 2018-05-06 LAB — CBC WITH DIFFERENTIAL/PLATELET
ABS IMMATURE GRANULOCYTES: 0.01 10*3/uL (ref 0.00–0.07)
Basophils Absolute: 0 10*3/uL (ref 0.0–0.1)
Basophils Relative: 0 %
Eosinophils Absolute: 0 10*3/uL (ref 0.0–0.5)
Eosinophils Relative: 1 %
HCT: 30.3 % — ABNORMAL LOW (ref 36.0–46.0)
Hemoglobin: 9.5 g/dL — ABNORMAL LOW (ref 12.0–15.0)
IMMATURE GRANULOCYTES: 0 %
Lymphocytes Relative: 33 %
Lymphs Abs: 1.2 10*3/uL (ref 0.7–4.0)
MCH: 28.4 pg (ref 26.0–34.0)
MCHC: 31.4 g/dL (ref 30.0–36.0)
MCV: 90.4 fL (ref 80.0–100.0)
Monocytes Absolute: 0.4 10*3/uL (ref 0.1–1.0)
Monocytes Relative: 10 %
NEUTROS PCT: 56 %
NRBC: 0 % (ref 0.0–0.2)
Neutro Abs: 1.9 10*3/uL (ref 1.7–7.7)
Platelets: 176 10*3/uL (ref 150–400)
RBC: 3.35 MIL/uL — ABNORMAL LOW (ref 3.87–5.11)
RDW: 15.6 % — ABNORMAL HIGH (ref 11.5–15.5)
WBC: 3.5 10*3/uL — ABNORMAL LOW (ref 4.0–10.5)

## 2018-05-06 LAB — URINALYSIS, ROUTINE W REFLEX MICROSCOPIC
Bilirubin Urine: NEGATIVE
Glucose, UA: NEGATIVE mg/dL
Hgb urine dipstick: NEGATIVE
Ketones, ur: NEGATIVE mg/dL
Nitrite: NEGATIVE
Protein, ur: NEGATIVE mg/dL
Specific Gravity, Urine: 1.015 (ref 1.005–1.030)
pH: 5 (ref 5.0–8.0)

## 2018-05-06 LAB — MAGNESIUM: Magnesium: 2 mg/dL (ref 1.7–2.4)

## 2018-05-06 LAB — COMPREHENSIVE METABOLIC PANEL
ALT: 32 U/L (ref 0–44)
AST: 47 U/L — ABNORMAL HIGH (ref 15–41)
Albumin: 2.9 g/dL — ABNORMAL LOW (ref 3.5–5.0)
Alkaline Phosphatase: 125 U/L (ref 38–126)
Anion gap: 8 (ref 5–15)
BUN: 18 mg/dL (ref 8–23)
CHLORIDE: 113 mmol/L — AB (ref 98–111)
CO2: 20 mmol/L — ABNORMAL LOW (ref 22–32)
Calcium: 8.3 mg/dL — ABNORMAL LOW (ref 8.9–10.3)
Creatinine, Ser: 1.58 mg/dL — ABNORMAL HIGH (ref 0.44–1.00)
GFR calc Af Amer: 37 mL/min — ABNORMAL LOW (ref >60)
GFR calc non Af Amer: 32 mL/min — ABNORMAL LOW (ref >60)
Glucose, Bld: 83 mg/dL (ref 70–99)
Potassium: 4.1 mmol/L (ref 3.5–5.1)
Sodium: 141 mmol/L (ref 135–145)
Total Bilirubin: 0.5 mg/dL (ref 0.3–1.2)
Total Protein: 6 g/dL — ABNORMAL LOW (ref 6.5–8.1)

## 2018-05-06 LAB — LACTIC ACID, PLASMA: Lactic Acid, Venous: 0.8 mmol/L (ref 0.5–1.9)

## 2018-05-06 LAB — TROPONIN I: Troponin I: 0.03 ng/mL (ref <0.03)

## 2018-05-06 LAB — LIPASE, BLOOD: Lipase: 108 U/L — ABNORMAL HIGH (ref 11–51)

## 2018-05-06 LAB — TSH: TSH: 1.431 u[IU]/mL (ref 0.350–4.500)

## 2018-05-06 LAB — PHOSPHORUS: Phosphorus: 3.4 mg/dL (ref 2.5–4.6)

## 2018-05-06 MED ORDER — LACTATED RINGERS IV SOLN
INTRAVENOUS | Status: DC
  Administered 2018-05-06 – 2018-05-07 (×2): via INTRAVENOUS

## 2018-05-06 MED ORDER — SODIUM CHLORIDE 0.9 % IV BOLUS
500.0000 mL | Freq: Once | INTRAVENOUS | Status: AC
  Administered 2018-05-06: 500 mL via INTRAVENOUS

## 2018-05-06 NOTE — ED Notes (Signed)
RN informed Pt can receive 2 visitors 

## 2018-05-06 NOTE — H&P (Signed)
History and Physical    April Page:[id number] DOB: [birth year] DOA: 05/06/2018  PCP: Redmond School, MD Patient coming from: Home  Chief Complaint: None  HPI: April Page is a 73 y.o. female with medical history significant of hypertension, hyperlipidemia, prior CVA, CML currently in remission on dasatinib. Patient did not have any complaints.  Upon repeated questioning history could be obtained from her.  Reported having generalized weakness for the past 1 week.  Reports noticing leakage of stool only at night.  No diarrhea, melena, or hematochezia.  States her stools are normal during the day.  Daughter at bedside states she thinks patient's legs are swollen.  Patient denies any history of bradycardia.  Denies having any chest pain, dizziness, or episodes of syncope.  Denies having any shortness of breath.  Reports having some cough for a week.  Denies having any fevers or chills.  States food has been tasting bad and she has not been eating much.  Denies having any dysuria, urinary frequency, or urgency.  No additional history could be obtained.   Review of Systems: As per HPI otherwise 10 point review of systems negative.  Past Medical History:  Diagnosis Date  . Arthritis    stiff knees  . Blood dyscrasia   . Cancer ([place])   . CVA (cerebral vascular accident) ([place])   . GERD (gastroesophageal reflux disease)   . H/O: CML (chronic myeloid leukemia)   . Hyperlipidemia   . Hyperlipidemia   . Hypertension   . Obesity   . Pessary maintenance 07/01/2013  . Stroke ([place])    x 3    Past Surgical History:  Procedure Laterality Date  . ABDOMINAL HYSTERECTOMY    . CHOLECYSTECTOMY    . COLONOSCOPY  06/09/2002   XJO:ITGPQDIY hemorrhoids; otherwise normal rectum, colon   . COLONOSCOPY N/A 08/25/2013   MEB:RAXENMM diverticulosis. Single colonic polyp-removed  s/p segmental biopsy and stool sample. random colon bx negative. +benign leiomyoma.  . cystocele/rectocele repair  2009    . ESOPHAGOGASTRODUODENOSCOPY  06/09/2002   HWK:GSUPJS upper gastrointestinal tract s/p  54-French Maloney dilator  . FEMUR IM NAIL Right 04/12/2016   Procedure: INTRAMEDULLARY (IM) NAIL FEMORAL;  Surgeon: Nicholes Stairs, MD;  Location: [facility];  Service: Orthopedics;  Laterality: Right;  . HERNIA REPAIR    . LOOP RECORDER INSERTION N/A 08/23/2016   Procedure: Loop Recorder Insertion;  Surgeon: Constance Haw, MD;  Location: [facility];  Service: Cardiovascular;  Laterality: N/A;  . OOPHORECTOMY    . TEE WITHOUT CARDIOVERSION N/A 08/23/2016   Procedure: TRANSESOPHAGEAL ECHOCARDIOGRAM (TEE);  Surgeon: Fay Records, MD;  Location: [facility] ENDOSCOPY;  Service: Cardiovascular;  Laterality: N/A;     reports that she has never smoked. She has never used smokeless tobacco. She reports that she does not drink alcohol or use drugs.  No Known Allergies  Family History  Problem Relation Age of Onset  . Anuerysm Mother        deceased age 70, brain anuerysm  . Early death Mother 28  . Heart disease Father   . Hypertension Sister   . Obesity Brother   . Hypertension Sister   . Arthritis Sister   . Other Son        cardiac arrest  . Heart disease Child 68       cardiac arrest  . Colon cancer Neg Hx     Prior to Admission medications   Medication Sig Start Date End Date Taking? Authorizing  Provider  acetaminophen (TYLENOL) 325 MG tablet Take 2 tablets (650 mg total) by mouth every 4 (four) hours as needed for mild pain (or temp > 37.5 C (99.5 F)). 08/25/16  Yes Barton Dubois, MD  aspirin 325 MG tablet Take 1 tablet (325 mg total) by mouth daily. 06/05/16  Yes Kathie Dike, MD  atorvastatin (LIPITOR) 40 MG tablet Take 1 tablet (40 mg total) by mouth daily at 6 PM. 08/18/16  Yes Samuella Cota, MD  dasatinib (SPRYCEL) 100 MG tablet Take 1 tablet (100 mg total) by mouth daily. 02/12/18  Yes Higgs, Mathis Dad, MD  losartan (COZAAR) 100 MG tablet Take 1 tablet (100 mg total) by mouth  daily. Resume on 3/28 Patient taking differently: Take 100 mg by mouth daily.  08/18/16  Yes Samuella Cota, MD  Multiple Vitamin (MULTIVITAMIN WITH MINERALS) TABS tablet Take 1 tablet by mouth daily.   Yes [provider]  polyethylene glycol (MIRALAX / GLYCOLAX) packet Take 17 g by mouth daily. Patient taking differently: Take 17 g by mouth daily as needed for mild constipation.  06/06/16  Deveron Furlong, MD    Physical Exam: Vitals:   05/06/18 2057 05/06/18 2230 05/07/18 0137 05/07/18 0609  BP: (!) 157/55 (!) 175/52 (!) 178/52 (!) 162/57  Pulse:  (!) 35 (!) 38 (!) 37  Resp:  19 16   Temp:   98.6 F (37 C) 97.6 F (36.4 C)  TempSrc:   Oral Oral  SpO2:  100% 100% 97%  Weight:   65.4 kg   Height:   5\' 4"  (1.626 m)     Physical Exam  Constitutional: She is oriented to person, place, and time. She appears well-developed and well-nourished. No distress.  Resting comfortably in a hospital bed  HENT:  Head: Normocephalic.  Mouth/Throat: Oropharynx is clear and moist.  Eyes: Right eye exhibits no discharge. Left eye exhibits no discharge.  Neck: Neck supple.  Cardiovascular: Normal rate, regular rhythm and intact distal pulses.  Pulmonary/Chest: Effort normal. No respiratory distress. She has no wheezes. She has no rales.  Decreased breath sounds at right lung base No increased work of breathing Speaking clearly in full sentences  Abdominal: Soft. Bowel sounds are normal. She exhibits no distension. There is no abdominal tenderness. There is no guarding.  Musculoskeletal:     Comments: Trace bilateral pedal edema.  Neurological: She is alert and oriented to person, place, and time.  Skin: Skin is warm and dry. She is not diaphoretic.  Psychiatric:  Flat affect     Labs on Admission: I have personally reviewed following labs and imaging studies  CBC: Recent Labs  Lab 05/06/18 2154  WBC 3.5*  NEUTROABS 1.9  HGB 9.5*  HCT 30.3*  MCV 90.4  PLT 193   Basic  Metabolic Panel: Recent Labs  Lab 05/06/18 2154  NA 141  K 4.1  CL 113*  CO2 20*  GLUCOSE 83  BUN 18  CREATININE 1.58*  CALCIUM 8.3*  MG 2.0  PHOS 3.4   GFR: Estimated Creatinine Clearance: 27.4 mL/min (A) (by C-G formula based on SCr of 1.58 mg/dL (H)). Liver Function Tests: Recent Labs  Lab 05/06/18 2154  AST 47*  ALT 32  ALKPHOS 125  BILITOT 0.5  PROT 6.0*  ALBUMIN 2.9*   Recent Labs  Lab 05/06/18 2154  LIPASE 108*   No results for input(s): AMMONIA in the last 168 hours. Coagulation Profile: No results for input(s): INR, PROTIME in the last 168 hours. Cardiac  Enzymes: Recent Labs  Lab 05/06/18 2154 05/07/18 0256  TROPONINI 0.03* 0.03*   BNP (last 3 results) No results for input(s): PROBNP in the last 8760 hours. HbA1C: No results for input(s): HGBA1C in the last 72 hours. CBG: No results for input(s): GLUCAP in the last 168 hours. Lipid Profile: No results for input(s): CHOL, HDL, LDLCALC, TRIG, CHOLHDL, LDLDIRECT in the last 72 hours. Thyroid Function Tests: Recent Labs    05/06/18 2154  TSH 1.431   Anemia Panel: No results for input(s): VITAMINB12, FOLATE, FERRITIN, TIBC, IRON, RETICCTPCT in the last 72 hours. Urine analysis:    Component Value Date/Time   COLORURINE YELLOW 05/06/2018 2303   APPEARANCEUR HAZY (A) 05/06/2018 2303   LABSPEC 1.015 05/06/2018 2303   PHURINE 5.0 05/06/2018 2303   GLUCOSEU NEGATIVE 05/06/2018 2303   HGBUR NEGATIVE 05/06/2018 2303   BILIRUBINUR NEGATIVE 05/06/2018 2303   KETONESUR NEGATIVE 05/06/2018 2303   PROTEINUR NEGATIVE 05/06/2018 2303   NITRITE NEGATIVE 05/06/2018 2303   LEUKOCYTESUR MODERATE (A) 05/06/2018 2303    Radiological Exams on Admission: Dg Chest 2 View  Result Date: 05/06/2018 CLINICAL DATA:  Bradycardia. History of hypertension. EXAM: CHEST - 2 VIEW COMPARISON:  08/20/2016 FINDINGS: Shallow inspiration. Loop recorder is present. Cardiac enlargement without vascular congestion. Moderate  right pleural effusion with basilar atelectasis or consolidation. This could indicate pneumonia. Left lung is clear. No pneumothorax. Calcified and tortuous aorta. Degenerative changes in the spine. IMPRESSION: Cardiac enlargement. Moderate right pleural effusion with basilar atelectasis or consolidation. Electronically Signed   By: Lucienne Capers M.D.   On: 05/06/2018 20:42    EKG: Independently reviewed.  Sinus bradycardia (heart rate 37), second-degree AV block with 2:1 AV conduction.  Assessment/Plan Principal Problem:   Bradycardia Active Problems:   Elevated troponin   Encopresis   Pleural effusion   AKI (acute kidney injury) (HCC)   Bradycardia -Could explain generalized weakness.  Not hypotensive (blood pressure elevated). Bradycardic with 2 is to 1 conduction and heart rate mostly in the 30s. TSH normal. -Cardiac monitoring -Echo -ED physician discussed with cardiology who recommended monitoring overnight and echo.  Elevated troponin -Troponin 0.03 > 0.03.  EKG not suggestive of ACS.  Patient denies having any chest pain. -Cardiac monitoring -Continue to trend troponin  Encopresis -Reports bowel incontinence only at night.  No diarrhea or incontinence during the day.  Patient is on home laxatives which could also be contributing. -Hold laxatives  ?UTI -UA with moderate amount of leukocytes, 6-10 WBCs, rare bacteria, and negative nitrite.  Patient is asymptomatic.  Afebrile and no leukocytosis.  Lactic acid normal. -Check urine culture  Pleural effusion -No increased work of breathing and not hypoxic. -Chest x-ray with evidence of moderate right pleural effusion with basilar atelectasis or consolidation.   -Pneumonia less likely as patient is afebrile and does not have leukocytosis.  Lactic acid normal.  Procalcitonin normal. -IR guided thoracentesis with labs for pleural fluid analysis has been ordered  AKI on CKD 3 -Creatinine 1.5, baseline 1.0.  Likely prerenal  due to dehydration from decreased p.o. intake. -IV fluid hydration -Avoid nephrotoxic agents/contrast -Monitor urine output -Continue to monitor renal function  Hypertension -Blood pressure elevated with systolic in the 846K. -Hydralazine PRN  Leukopenia and anemia -Likely secondary to history of malignancy and prior chemo. -White count 3.5.  Hemoglobin 9.5, no signs of active bleeding.  Patient denies having any melena or hematochezia. -Continue to monitor CBC  Hyperlipidemia -Continue statin  CML -Continue home dasatinib  Physical deconditioning -  PT evaluation  DVT prophylaxis: SCDs Code Status: Full code.  Discussed with the patient. Family Communication: Daughter at bedside. Disposition Plan: Anticipate discharge in 1 to 2 days. Admission status: Observation, cardiac telemetry   Shela Leff MD [facility] Pager [phone]  If 7PM-7AM, please contact night-coverage [email or web address] Password [facility]  05/07/2018, 7:44 AM

## 2018-05-06 NOTE — ED Triage Notes (Signed)
Pt arrives with complaints of bowel incontinence during the night, lower leg edema, and loss of taste for several weeks. Pt here today per PCP to have blood drawn and have a CT scan. Pt bradycardic in triage, taking chemo meds at this time for leukemia.

## 2018-05-06 NOTE — ED Provider Notes (Signed)
Emergency Department Provider Note   I have reviewed the triage vital signs and the nursing notes.   HISTORY  Chief Complaint Encopresis and Leg Swelling   HPI April Page is a 73 y.o. female with PMH of HTN, HLD, prior CVA, and CML currently in remission but on Dasatinib 100 mg daily presents to the emergency department with fatigue, leg swelling, and loss of appetite.  Patient and family state that she is no longer tasting the food that she is given.  This is been ongoing for several weeks.  She has developed associated generalized weakness.  No fevers.  She denies any chest pain or shortness of breath.  No changes to medications or recent antibiotics.  Family also notes 3 separate episodes where the patient wakes up covered in diarrhea. No stool incontinence during the day.  No severe back pain or numbness/tingling symptoms. No history of bradycardia.    Past Medical History:  Diagnosis Date  . Arthritis    stiff knees  . Blood dyscrasia   . Cancer ([place])   . CVA (cerebral vascular accident) ([place])   . GERD (gastroesophageal reflux disease)   . H/O: CML (chronic myeloid leukemia)   . Hyperlipidemia   . Hyperlipidemia   . Hypertension   . Obesity   . Pessary maintenance 07/01/2013  . Stroke [facility])    x 3    Patient Active Problem List   Diagnosis Date Noted  . Vaginal bleeding 11/04/2017  . Vaginal irritation from pessary 11/04/2017  . Controlled diabetes mellitus type 2 with complications ([place]) 56/43/3295  . Physical deconditioning   . Acute ischemic stroke ([place]) 08/20/2016  . Pressure injury of skin 08/17/2016  . Acute CVA (cerebrovascular accident) ([place]) 08/17/2016  . UTI (urinary tract infection) 08/16/2016  . Constipation 07/10/2016  . Dysphagia as late effect of cerebrovascular accident (CVA) 07/10/2016  . Loss of weight 07/10/2016  . Impaired glucose tolerance 06/05/2016  . Slurred speech   . Hypokalemia 04/13/2016  . Hyperglycemia 04/13/2016  .  Essential hypertension 04/13/2016  . Hyperlipidemia 04/13/2016  . S/P right hip fracture 04/12/2016  . Hip fracture ([place]) 04/12/2016  . Fall   . Itching in the vaginal area 10/05/2014  . Chronic myelogenous leukemia (CML), BCR-ABL1-positive ([place])   . GERD (gastroesophageal reflux disease) 12/06/2013  . Leukocytosis 08/06/2013  . Atypical chest pain 08/06/2013  . Chronic diarrhea 08/05/2013  . Pessary maintenance 07/01/2013    Past Surgical History:  Procedure Laterality Date  . ABDOMINAL HYSTERECTOMY    . CHOLECYSTECTOMY    . COLONOSCOPY  06/09/2002   JOA:[id number] hemorrhoids; otherwise normal rectum, colon   . COLONOSCOPY N/A 08/25/2013   ZSW:FUXNATF diverticulosis. Single colonic polyp-removed  s/p segmental biopsy and stool sample. random colon bx negative. +benign leiomyoma.  . cystocele/rectocele repair  2009  . ESOPHAGOGASTRODUODENOSCOPY  06/09/2002   TDD:UKGURK upper gastrointestinal tract s/p  54-French Maloney dilator  . FEMUR IM NAIL Right 04/12/2016   Procedure: INTRAMEDULLARY (IM) NAIL FEMORAL;  Surgeon: Nicholes Stairs, MD;  Location: [facility];  Service: Orthopedics;  Laterality: Right;  . HERNIA REPAIR    . LOOP RECORDER INSERTION N/A 08/23/2016   Procedure: Loop Recorder Insertion;  Surgeon: Constance Haw, MD;  Location: [facility];  Service: Cardiovascular;  Laterality: N/A;  . OOPHORECTOMY    . TEE WITHOUT CARDIOVERSION N/A 08/23/2016   Procedure: TRANSESOPHAGEAL ECHOCARDIOGRAM (TEE);  Surgeon: Fay Records, MD;  Location: [facility];  Service: Cardiovascular;  Laterality: N/A;  Allergies Patient has no known allergies.  Family History  Problem Relation Age of Onset  . Anuerysm Mother        deceased age 20, brain anuerysm  . Early death Mother 70  . Heart disease Father   . Hypertension Sister   . Obesity Brother   . Hypertension Sister   . Arthritis Sister   . Other Son        cardiac arrest  . Heart disease Child 9       cardiac  arrest  . Colon cancer Neg Hx     Social History Social History   Tobacco Use  . Smoking status: Never Smoker  . Smokeless tobacco: Never Used  Substance Use Topics  . Alcohol use: No  . Drug use: No    Review of Systems  Constitutional: No fever/chills. Positive generalized weakness.  Eyes: No visual changes. ENT: No sore throat. Not tasting food well.  Cardiovascular: Denies chest pain. Positive leg swelling.  Respiratory: Denies shortness of breath. Gastrointestinal: No abdominal pain.  No nausea, no vomiting.  No diarrhea.  No constipation. Genitourinary: Negative for dysuria. Musculoskeletal: Negative for back pain.  Skin: Negative for rash. Neurological: Negative for headaches, focal weakness or numbness.  10-point ROS otherwise negative.  ____________________________________________   PHYSICAL EXAM:  VITAL SIGNS: ED Triage Vitals  Enc Vitals Group     BP 05/06/18 1917 (!) 178/48     Pulse Rate 05/06/18 1917 (!) 37     Resp 05/06/18 1917 16     Temp 05/06/18 1917 98.9 F (37.2 C)     Temp Source 05/06/18 1917 Oral     SpO2 05/06/18 1917 100 %     Weight 05/06/18 1914 160 lb (72.6 kg)     Height 05/06/18 1914 5\' 4"  (1.626 m)   Constitutional: Alert and oriented. Well appearing and in no acute distress. Eyes: Conjunctivae are normal.  Head: Atraumatic. Nose: No congestion/rhinnorhea. Mouth/Throat: Mucous membranes are moist.  Oropharynx non-erythematous. Neck: No stridor.  Cardiovascular: Sinus bradycardia with 2:1 conduction. Good peripheral circulation. Grossly normal heart sounds.   Respiratory: Normal respiratory effort.  No retractions. Lungs CTAB. Gastrointestinal: Soft and nontender. No distention.  Musculoskeletal: No lower extremity tenderness with trace pitting edema bilaterally. No gross deformities of extremities. Neurologic:  Normal speech and language. No gross focal neurologic deficits are appreciated.  Skin:  Skin is warm, dry and intact.  No rash noted. Mole to the bottom of the right foot. Callous to the right lateral foot noted.   ____________________________________________   LABS (all labs ordered are listed, but only abnormal results are displayed)  Labs Reviewed  COMPREHENSIVE METABOLIC PANEL - Abnormal; Notable for the following components:      Result Value   Chloride 113 (*)    CO2 20 (*)    Creatinine, Ser 1.58 (*)    Calcium 8.3 (*)    Total Protein 6.0 (*)    Albumin 2.9 (*)    AST 47 (*)    GFR calc non Af Amer 32 (*)    GFR calc Af Amer 37 (*)    All other components within normal limits  LIPASE, BLOOD - Abnormal; Notable for the following components:   Lipase 108 (*)    All other components within normal limits  TROPONIN I - Abnormal; Notable for the following components:   Troponin I 0.03 (*)    All other components within normal limits  CBC WITH DIFFERENTIAL/PLATELET - Abnormal; Notable for the  following components:   WBC 3.5 (*)    RBC 3.35 (*)    Hemoglobin 9.5 (*)    HCT 30.3 (*)    RDW 15.6 (*)    All other components within normal limits  URINALYSIS, ROUTINE W REFLEX MICROSCOPIC - Abnormal; Notable for the following components:   APPearance HAZY (*)    Leukocytes,Ua MODERATE (*)    Bacteria, UA RARE (*)    All other components within normal limits  LACTIC ACID, PLASMA  MAGNESIUM  PHOSPHORUS  TSH   ____________________________________________  EKG   EKG Interpretation  Date/Time:  Wednesday May 06 2018 19:14:10 EST Ventricular Rate:  37 PR Interval:  130 QRS Duration: 92 QT Interval:  512 QTC Calculation: 401 R Axis:   14 Text Interpretation:  Sinus rhythm with 2nd degree A-V block with 2:1 A-V conduction Incomplete right bundle branch block Abnormal ECG Bradycardia. No STEMI.  Confirmed by Nanda Quinton [phone]) on 05/06/2018 7:55:52 PM       ____________________________________________  RADIOLOGY  Dg Chest 2 View  Result Date: 05/06/2018 CLINICAL DATA:   Bradycardia. History of hypertension. EXAM: CHEST - 2 VIEW COMPARISON:  08/20/2016 FINDINGS: Shallow inspiration. Loop recorder is present. Cardiac enlargement without vascular congestion. Moderate right pleural effusion with basilar atelectasis or consolidation. This could indicate pneumonia. Left lung is clear. No pneumothorax. Calcified and tortuous aorta. Degenerative changes in the spine. IMPRESSION: Cardiac enlargement. Moderate right pleural effusion with basilar atelectasis or consolidation. Electronically Signed   By: Lucienne Capers M.D.   On: 05/06/2018 20:42    ____________________________________________   PROCEDURES  Procedure(s) performed:   Procedures  None  ____________________________________________   INITIAL IMPRESSION / ASSESSMENT AND PLAN / ED COURSE  Pertinent labs & imaging results that were available during my care of the patient were reviewed by me and considered in my medical decision making (see chart for details).  Patient presents to the emergency department with several nonspecific symptoms including generalized weakness and leg swelling.  Patient not tasting food well that has been ongoing for several weeks.  She has no abdominal tenderness or back pain to explain her incontinence.  Symptoms are only at night.  Patient does have significant bradycardia here with 2-1 conduction and heart rate mostly in the 30s.  Blood pressure is high in the setting.  Patient is awake and alert.  Plan for screening labs to evaluate for possible electrolyte abnormality.   11:30 PM Patient with no electrolyte abnormality.  TSH is normal.  Patient does have a very mild elevation in troponin 0.03 and some acute kidney injury.  I will give IV fluids and trend troponins overnight.  Patient is not having chest pain or other anginal equivalent.  I suspect that her generalized weakness is multifactorial.  Patient is hypertensive but bradycardia may be contributing to her fatigue.  Also  appears dehydrated.  I discussed the case with Dr. Paticia Stack with Cardiology who advises monitoring overnight and echo in the morning.  If no changes, patient can follow with cardiology as an outpatient.  He reviewed the EKG and does not think she needs urgent pacemaker placement as she is hypertensive.   Discussed patient's case with Hospitalist to request admission. Patient and family (if present) updated with plan. Care transferred to Hospitalist service.  I reviewed all nursing notes, vitals, pertinent old records, EKGs, labs, imaging (as available).   ____________________________________________  FINAL CLINICAL IMPRESSION(S) / ED DIAGNOSES  Final diagnoses:  Symptomatic bradycardia  AKI (acute kidney injury) ([place])  MEDICATIONS GIVEN DURING THIS VISIT:  Medications  lactated ringers infusion ( Intravenous New Bag/Given 05/06/18 2314)  sodium chloride 0.9 % bolus 500 mL (0 mLs Intravenous Stopped 05/06/18 2237)    Note:  This document was prepared using Dragon voice recognition software and may include unintentional dictation errors.  Nanda Quinton, MD Emergency Medicine    Long, [facility], MD 05/07/18 [phone]

## 2018-05-06 NOTE — ED Notes (Signed)
Date and time results received: 05/06/18 2300  Test: troponin Critical Value: 0.03  Name of Provider Notified:   Orders Received? Or Actions Taken?:

## 2018-05-06 NOTE — ED Notes (Signed)
Date and time results received: 05/06/18 2300  Test: troponin Critical Value: 0.03  Name of Provider Notified: Dr. Laverta [name]  Orders Received? Or Actions Taken?: No new orders at this time

## 2018-05-07 ENCOUNTER — Observation Stay (HOSPITAL_COMMUNITY): Payer: Medicare HMO

## 2018-05-07 ENCOUNTER — Ambulatory Visit (HOSPITAL_COMMUNITY): Admit: 2018-05-07 | Payer: Medicare HMO | Admitting: Cardiology

## 2018-05-07 ENCOUNTER — Encounter (HOSPITAL_COMMUNITY): Payer: Self-pay | Admitting: Physician Assistant

## 2018-05-07 ENCOUNTER — Other Ambulatory Visit: Payer: Self-pay

## 2018-05-07 ENCOUNTER — Inpatient Hospital Stay (HOSPITAL_COMMUNITY)
Admission: EM | Disposition: A | Discharge status: Home Health | Payer: Self-pay | Source: Home / Self Care | Attending: Internal Medicine

## 2018-05-07 DIAGNOSIS — J90 Pleural effusion, not elsewhere classified: Secondary | ICD-10-CM | POA: Diagnosis present

## 2018-05-07 DIAGNOSIS — R778 Other specified abnormalities of plasma proteins: Secondary | ICD-10-CM

## 2018-05-07 DIAGNOSIS — R159 Full incontinence of feces: Secondary | ICD-10-CM

## 2018-05-07 DIAGNOSIS — Z7982 Long term (current) use of aspirin: Secondary | ICD-10-CM | POA: Diagnosis not present

## 2018-05-07 DIAGNOSIS — E785 Hyperlipidemia, unspecified: Secondary | ICD-10-CM | POA: Diagnosis present

## 2018-05-07 DIAGNOSIS — E1122 Type 2 diabetes mellitus with diabetic chronic kidney disease: Secondary | ICD-10-CM | POA: Diagnosis present

## 2018-05-07 DIAGNOSIS — R001 Bradycardia, unspecified: Secondary | ICD-10-CM | POA: Diagnosis present

## 2018-05-07 DIAGNOSIS — E860 Dehydration: Secondary | ICD-10-CM | POA: Diagnosis present

## 2018-05-07 DIAGNOSIS — I441 Atrioventricular block, second degree: Secondary | ICD-10-CM | POA: Diagnosis present

## 2018-05-07 DIAGNOSIS — R9431 Abnormal electrocardiogram [ECG] [EKG]: Secondary | ICD-10-CM | POA: Diagnosis not present

## 2018-05-07 DIAGNOSIS — C9211 Chronic myeloid leukemia, BCR/ABL-positive, in remission: Secondary | ICD-10-CM | POA: Diagnosis present

## 2018-05-07 DIAGNOSIS — Z9889 Other specified postprocedural states: Secondary | ICD-10-CM | POA: Diagnosis not present

## 2018-05-07 DIAGNOSIS — N179 Acute kidney failure, unspecified: Secondary | ICD-10-CM | POA: Diagnosis present

## 2018-05-07 DIAGNOSIS — Z8673 Personal history of transient ischemic attack (TIA), and cerebral infarction without residual deficits: Secondary | ICD-10-CM | POA: Diagnosis not present

## 2018-05-07 DIAGNOSIS — R918 Other nonspecific abnormal finding of lung field: Secondary | ICD-10-CM | POA: Diagnosis not present

## 2018-05-07 DIAGNOSIS — I129 Hypertensive chronic kidney disease with stage 1 through stage 4 chronic kidney disease, or unspecified chronic kidney disease: Secondary | ICD-10-CM | POA: Diagnosis present

## 2018-05-07 DIAGNOSIS — I6389 Other cerebral infarction: Secondary | ICD-10-CM | POA: Diagnosis not present

## 2018-05-07 DIAGNOSIS — R7989 Other specified abnormal findings of blood chemistry: Secondary | ICD-10-CM

## 2018-05-07 DIAGNOSIS — Z9221 Personal history of antineoplastic chemotherapy: Secondary | ICD-10-CM | POA: Diagnosis not present

## 2018-05-07 DIAGNOSIS — Z8249 Family history of ischemic heart disease and other diseases of the circulatory system: Secondary | ICD-10-CM | POA: Diagnosis not present

## 2018-05-07 DIAGNOSIS — K219 Gastro-esophageal reflux disease without esophagitis: Secondary | ICD-10-CM | POA: Diagnosis present

## 2018-05-07 DIAGNOSIS — N183 Chronic kidney disease, stage 3 (moderate): Secondary | ICD-10-CM | POA: Diagnosis present

## 2018-05-07 DIAGNOSIS — D631 Anemia in chronic kidney disease: Secondary | ICD-10-CM | POA: Diagnosis present

## 2018-05-07 DIAGNOSIS — L89152 Pressure ulcer of sacral region, stage 2: Secondary | ICD-10-CM | POA: Diagnosis present

## 2018-05-07 DIAGNOSIS — R846 Abnormal cytological findings in specimens from respiratory organs and thorax: Secondary | ICD-10-CM | POA: Diagnosis not present

## 2018-05-07 DIAGNOSIS — Z8261 Family history of arthritis: Secondary | ICD-10-CM | POA: Diagnosis not present

## 2018-05-07 HISTORY — PX: LOOP RECORDER REMOVAL: EP1215

## 2018-05-07 HISTORY — PX: IR THORACENTESIS ASP PLEURAL SPACE W/IMG GUIDE: IMG5380

## 2018-05-07 HISTORY — PX: PACEMAKER IMPLANT: EP1218

## 2018-05-07 LAB — BODY FLUID CELL COUNT WITH DIFFERENTIAL
Eos, Fluid: 0 %
Lymphs, Fluid: 94 %
Monocyte-Macrophage-Serous Fluid: 4 % — ABNORMAL LOW (ref 50–90)
Neutrophil Count, Fluid: 2 % (ref 0–25)
Total Nucleated Cell Count, Fluid: 2730 cu mm — ABNORMAL HIGH (ref 0–1000)

## 2018-05-07 LAB — BASIC METABOLIC PANEL
Anion gap: 10 (ref 5–15)
BUN: 14 mg/dL (ref 8–23)
CALCIUM: 8.3 mg/dL — AB (ref 8.9–10.3)
CO2: 16 mmol/L — ABNORMAL LOW (ref 22–32)
Chloride: 115 mmol/L — ABNORMAL HIGH (ref 98–111)
Creatinine, Ser: 1.26 mg/dL — ABNORMAL HIGH (ref 0.44–1.00)
GFR calc Af Amer: 49 mL/min — ABNORMAL LOW (ref >60)
GFR, EST NON AFRICAN AMERICAN: 42 mL/min — AB (ref >60)
GLUCOSE: 75 mg/dL (ref 70–99)
Potassium: 4.2 mmol/L (ref 3.5–5.1)
Sodium: 141 mmol/L (ref 135–145)

## 2018-05-07 LAB — BRAIN NATRIURETIC PEPTIDE: B Natriuretic Peptide: 98.6 pg/mL (ref 0.0–100.0)

## 2018-05-07 LAB — GRAM STAIN

## 2018-05-07 LAB — PROCALCITONIN: Procalcitonin: 0.1 ng/mL

## 2018-05-07 LAB — PROTEIN, PLEURAL OR PERITONEAL FLUID: Total protein, fluid: 3.6 g/dL

## 2018-05-07 LAB — SURGICAL PCR SCREEN
MRSA, PCR: POSITIVE — AB
Staphylococcus aureus: POSITIVE — AB

## 2018-05-07 LAB — LACTATE DEHYDROGENASE, PLEURAL OR PERITONEAL FLUID: LD, Fluid: 184 U/L — ABNORMAL HIGH (ref 3–23)

## 2018-05-07 LAB — GLUCOSE, PLEURAL OR PERITONEAL FLUID: Glucose, Fluid: 85 mg/dL

## 2018-05-07 LAB — LACTATE DEHYDROGENASE
LDH: 291 U/L — ABNORMAL HIGH (ref 98–192)
LDH: 323 U/L — ABNORMAL HIGH (ref 98–192)

## 2018-05-07 LAB — TROPONIN I: TROPONIN I: 0.03 ng/mL — AB (ref <0.03)

## 2018-05-07 LAB — PROTEIN, TOTAL: Total Protein: 6.4 g/dL — ABNORMAL LOW (ref 6.5–8.1)

## 2018-05-07 LAB — ECHOCARDIOGRAM COMPLETE
Height: 64 in
Weight: 2307.2 oz

## 2018-05-07 SURGERY — PACEMAKER IMPLANT
Anesthesia: LOCAL

## 2018-05-07 MED ORDER — CEFAZOLIN SODIUM-DEXTROSE 2-4 GM/100ML-% IV SOLN
INTRAVENOUS | Status: AC
  Filled 2018-05-07: qty 100

## 2018-05-07 MED ORDER — SODIUM CHLORIDE 0.9 % IV SOLN
80.0000 mg | INTRAVENOUS | Status: AC
  Administered 2018-05-07: 80 mg
  Filled 2018-05-07: qty 2

## 2018-05-07 MED ORDER — CHLORHEXIDINE GLUCONATE 4 % EX LIQD
60.0000 mL | Freq: Once | CUTANEOUS | Status: AC
  Administered 2018-05-07: 4 via TOPICAL

## 2018-05-07 MED ORDER — CEFAZOLIN SODIUM-DEXTROSE 2-4 GM/100ML-% IV SOLN
2.0000 g | INTRAVENOUS | Status: AC
  Administered 2018-05-07: 2 g via INTRAVENOUS
  Filled 2018-05-07: qty 100

## 2018-05-07 MED ORDER — MUPIROCIN 2 % EX OINT
1.0000 "application " | TOPICAL_OINTMENT | Freq: Two times a day (BID) | CUTANEOUS | Status: DC
  Administered 2018-05-08 (×2): 1 via NASAL
  Filled 2018-05-07: qty 22

## 2018-05-07 MED ORDER — ATORVASTATIN CALCIUM 40 MG PO TABS
40.0000 mg | ORAL_TABLET | Freq: Every day | ORAL | Status: DC
  Administered 2018-05-07: 40 mg via ORAL
  Filled 2018-05-07: qty 1

## 2018-05-07 MED ORDER — ONDANSETRON HCL 4 MG/2ML IJ SOLN: 4.0000 mg | Freq: Four times a day (QID) | INTRAMUSCULAR | Status: DC | PRN

## 2018-05-07 MED ORDER — ACETAMINOPHEN 325 MG PO TABS
650.0000 mg | ORAL_TABLET | Freq: Four times a day (QID) | ORAL | Status: DC | PRN
  Filled 2018-05-07: qty 2

## 2018-05-07 MED ORDER — CHLORHEXIDINE GLUCONATE CLOTH 2 % EX PADS
6.0000 | MEDICATED_PAD | Freq: Every day | CUTANEOUS | Status: DC
  Administered 2018-05-08: 6 via TOPICAL

## 2018-05-07 MED ORDER — LIDOCAINE HCL 1 % IJ SOLN
INTRAMUSCULAR | Status: AC
  Filled 2018-05-07: qty 20

## 2018-05-07 MED ORDER — SODIUM CHLORIDE 0.9% FLUSH: 3.0000 mL | INTRAVENOUS | Status: DC | PRN

## 2018-05-07 MED ORDER — LIDOCAINE HCL (PF) 1 % IJ SOLN
INTRAMUSCULAR | Status: DC | PRN
  Administered 2018-05-07: 90 mL
  Administered 2018-05-07: 5 mL

## 2018-05-07 MED ORDER — SODIUM CHLORIDE 0.9 % IV SOLN: 250.0000 mL | INTRAVENOUS | Status: DC

## 2018-05-07 MED ORDER — FENTANYL CITRATE (PF) 100 MCG/2ML IJ SOLN
INTRAMUSCULAR | Status: AC
  Filled 2018-05-07: qty 2

## 2018-05-07 MED ORDER — MIDAZOLAM HCL 5 MG/5ML IJ SOLN
INTRAMUSCULAR | Status: AC
  Filled 2018-05-07: qty 5

## 2018-05-07 MED ORDER — CEFAZOLIN SODIUM-DEXTROSE 1-4 GM/50ML-% IV SOLN
1.0000 g | Freq: Four times a day (QID) | INTRAVENOUS | Status: AC
  Administered 2018-05-07 – 2018-05-08 (×2): 1 g via INTRAVENOUS
  Filled 2018-05-07 (×2): qty 50

## 2018-05-07 MED ORDER — HEPARIN (PORCINE) IN NACL 1000-0.9 UT/500ML-% IV SOLN
INTRAVENOUS | Status: DC | PRN
  Administered 2018-05-07: 500 mL

## 2018-05-07 MED ORDER — LIDOCAINE HCL 1 % IJ SOLN
INTRAMUSCULAR | Status: DC | PRN
  Administered 2018-05-07: 10 mL

## 2018-05-07 MED ORDER — SODIUM CHLORIDE 0.9% FLUSH: 3.0000 mL | Freq: Two times a day (BID) | INTRAVENOUS | Status: DC

## 2018-05-07 MED ORDER — ACETAMINOPHEN 650 MG RE SUPP: 650.0000 mg | Freq: Four times a day (QID) | RECTAL | Status: DC | PRN

## 2018-05-07 MED ORDER — HEPARIN (PORCINE) IN NACL 1000-0.9 UT/500ML-% IV SOLN
INTRAVENOUS | Status: AC
  Filled 2018-05-07: qty 500

## 2018-05-07 MED ORDER — SODIUM CHLORIDE 0.9 % IV SOLN: INTRAVENOUS | Status: DC

## 2018-05-07 MED ORDER — CHLORHEXIDINE GLUCONATE 4 % EX LIQD
CUTANEOUS | Status: AC
  Administered 2018-05-07: 4 via TOPICAL
  Filled 2018-05-07: qty 15

## 2018-05-07 MED ORDER — ENSURE ENLIVE PO LIQD
237.0000 mL | Freq: Two times a day (BID) | ORAL | Status: DC
  Administered 2018-05-08: 237 mL via ORAL

## 2018-05-07 MED ORDER — CHLORHEXIDINE GLUCONATE 4 % EX LIQD: 60.0000 mL | Freq: Once | CUTANEOUS | Status: DC

## 2018-05-07 MED ORDER — ADULT MULTIVITAMIN W/MINERALS CH
1.0000 | ORAL_TABLET | Freq: Every day | ORAL | Status: DC
  Administered 2018-05-08: 1 via ORAL
  Filled 2018-05-07: qty 1

## 2018-05-07 MED ORDER — LIDOCAINE HCL 1 % IJ SOLN
INTRAMUSCULAR | Status: AC
  Filled 2018-05-07: qty 40

## 2018-05-07 MED ORDER — DASATINIB 100 MG PO TABS: 100.0000 mg | ORAL_TABLET | Freq: Every evening | ORAL | Status: DC

## 2018-05-07 MED ORDER — LOSARTAN POTASSIUM 50 MG PO TABS
100.0000 mg | ORAL_TABLET | Freq: Every day | ORAL | Status: DC
  Administered 2018-05-07 – 2018-05-08 (×2): 100 mg via ORAL
  Filled 2018-05-07 (×2): qty 2

## 2018-05-07 MED ORDER — SODIUM CHLORIDE 0.9 % IV SOLN
INTRAVENOUS | Status: AC
  Filled 2018-05-07: qty 2

## 2018-05-07 MED ORDER — HYDRALAZINE HCL 20 MG/ML IJ SOLN: 5.0000 mg | INTRAMUSCULAR | Status: DC | PRN

## 2018-05-07 MED ORDER — SODIUM CHLORIDE 0.9 % IV SOLN
INTRAVENOUS | Status: AC
  Administered 2018-05-07: 09:00:00 via INTRAVENOUS

## 2018-05-07 SURGICAL SUPPLY — 8 items
CABLE SURGICAL S-101-97-12 (CABLE) ×2 IMPLANT
IPG PACE AZUR XT DR MRI W1DR01 (Pacemaker) ×1 IMPLANT
LEAD CAPSURE NOVUS 45CM (Lead) ×2 IMPLANT
LEAD CAPSURE NOVUS 5076-52CM (Lead) ×2 IMPLANT
PACE AZURE XT DR MRI W1DR01 (Pacemaker) ×2 IMPLANT
PAD PRO RADIOLUCENT 2001M-C (PAD) ×2 IMPLANT
SHEATH CLASSIC 7F (SHEATH) ×4 IMPLANT
TRAY PACEMAKER INSERTION (PACKS) ×2 IMPLANT

## 2018-05-07 NOTE — Care Management Obs Status (Signed)
[facility] NOTIFICATION   Patient Details  Name: April Page MRN: [id number] Date of Birth: [birth year]   Medicare Observation Status Notification Given:  Yes    Carles Collet, RN 05/07/2018, 11:50 AM

## 2018-05-07 NOTE — Evaluation (Signed)
Physical Therapy Evaluation Patient Details Name: April Page MRN: [id number] DOB: November 05, 1945 Today's Date: 05/07/2018   History of Present Illness  Pt is a 73 y.o. female admitted 05/06/18 with c/o generalized weakness and leaky stool. Worked up for bradycardia; potential UTI. CXR with moderate R pleural effusion. Also AKI on CKD3. PMH includes HTN, HLD, CVA.    Clinical Impression  Pt presents with an overall decrease in functional mobility secondary to above. PTA, pt lives alone, daughters assist daily with ADLs and household tasks; household ambulation with RW. Today, pt ambulatory with RW at supervision-level. HR 60-80s with mobility. Daughters looking into aide options so pt can have increased supervision/assist at home. Pt would benefit from continued acute PT services to maximize functional mobility and independence prior to d/c with HHPT services.     Follow Up Recommendations Home health PT;Supervision for mobility/OOB    Equipment Recommendations  None recommended by PT    Recommendations for Other Services       Precautions / Restrictions Precautions Precautions: Fall Restrictions Weight Bearing Restrictions: No      Mobility  Bed Mobility Overal bed mobility: Modified Independent             General bed mobility comments: HOB slightly elevated; increased time  Transfers Overall transfer level: Needs assistance Equipment used: Rolling walker (2 wheeled) Transfers: Sit to/from Stand Sit to Stand: Supervision         General transfer comment: Good hand placement without cues; supervision for safety. Good eccentric control into low chair height  Ambulation/Gait Ambulation/Gait assistance: Supervision Gait Distance (Feet): 80 Feet Assistive device: Rolling walker (2 wheeled) Gait Pattern/deviations: Step-through pattern;Decreased stride length;Trunk flexed Gait velocity: Decreased Gait velocity interpretation: <1.31 ft/sec, indicative of household  ambulator General Gait Details: Very slow, but steady gait with RW; supervision for safety. Daughter present and reports pt's baseline gait speed  Stairs            Wheelchair Mobility    Modified Rankin (Stroke Patients Only)       Balance Overall balance assessment: Needs assistance   Sitting balance-Leahy Scale: Good       Standing balance-Leahy Scale: Fair Standing balance comment: Can static stand at sink without UE support; holds sink with single UE for dynamic standing tasks (brushing teeth)                             Pertinent Vitals/Pain Pain Assessment: No/denies pain    Home Living Family/patient expects to be discharged to:: Private residence Living Arrangements: Alone Available Help at Discharge: Family;Available 24 hours/day Type of Home: Apartment Home Access: Level entry     Home Layout: One level Home Equipment: Bedside commode;Walker - 2 wheels;Tub bench;Hand held shower head      Prior Function Level of Independence: Needs assistance   Gait / Transfers Assistance Needed: Ambulatory with RW. Pushed in manual wheelchair for community distances. Daughters drive  ADL's / Homemaking Assistance Needed: Daughter always present when pt bathes, uses tub bench; if daughter not present, pt washes up at sink. Daughters provide meal prep and household tasks        Hand Dominance        Extremity/Trunk Assessment   Upper Extremity Assessment Upper Extremity Assessment: Overall WFL for tasks assessed    Lower Extremity Assessment Lower Extremity Assessment: Generalized weakness    Cervical / Trunk Assessment Cervical / Trunk Assessment: Kyphotic  Communication  Cognition Arousal/Alertness: Awake/alert Behavior During Therapy: Flat affect Overall Cognitive Status: History of cognitive impairments - at baseline                                 General Comments: Daughter reports baseline cognition generally  slowed processing. Answering questions and interacting appropriately      General Comments General comments (skin integrity, edema, etc.): HR 60-80s with mobility    Exercises     Assessment/Plan    PT Assessment Patient needs continued PT services  PT Problem List Decreased strength;Decreased activity tolerance;Decreased balance;Decreased mobility;Cardiopulmonary status limiting activity       PT Treatment Interventions DME instruction;Gait training;Functional mobility training;Therapeutic activities;Therapeutic exercise;Stair training;Balance training;Patient/family education    PT Goals (Current goals can be found in the Care Plan section)  Acute Rehab PT Goals Patient Stated Goal: Return home with more frequent supervision; daughter hopeful for aide support PT Goal Formulation: With patient/family Time For Goal Achievement: 05/21/18 Potential to Achieve Goals: Good    Frequency Min 3X/week   Barriers to discharge        Co-evaluation               AM-PAC PT "6 Clicks" Mobility  Outcome Measure Help needed turning from your back to your side while in a flat bed without using bedrails?: None Help needed moving from lying on your back to sitting on the side of a flat bed without using bedrails?: None Help needed moving to and from a bed to a chair (including a wheelchair)?: A Little Help needed standing up from a chair using your arms (e.g., wheelchair or bedside chair)?: A Little Help needed to walk in hospital room?: A Little Help needed climbing 3-5 steps with a railing? : A Lot 6 Click Score: 19    End of Session Equipment Utilized During Treatment: Gait belt Activity Tolerance: Patient tolerated treatment well Patient left: in chair;with call bell/phone within reach;with chair alarm set;with family/visitor present Nurse Communication: Mobility status(pt can walk with supervision from family) PT Visit Diagnosis: Other abnormalities of gait and mobility  (R26.89)    Time: 0940-7680 PT Time Calculation (min) (ACUTE ONLY): 29 min   Charges:   PT Evaluation $PT Eval Moderate Complexity: 1 Mod PT Treatments $Self Care/Home Management: 8-22      April Page, PT, DPT Acute Rehabilitation Services  Pager [phone] Office [phone]  Derry Lory 05/07/2018, 9:31 AM

## 2018-05-07 NOTE — Progress Notes (Signed)
PROGRESS NOTE                                                                                                                                                                                                             Patient Demographics:    April Page, is a 73 y.o. female, DOB - 1945-08-05, GXQ:[id number]  Admit date - 05/06/2018   Admitting Physician Shela Leff, MD  Outpatient Primary MD for the patient is Redmond School, MD  LOS - 0   Chief Complaint  Patient presents with  . Encopresis  . Leg Swelling       Brief Narrative    73 y.o. female with medical history significant of hypertension, hyperlipidemia, prior CVA, CML currently in remission on dasatinib.  Patient presents with complaints of generalized weakness, her work-up was significant for pleural effusion, and bradycardia, noticed to be in second-degree Mobitz 2 heart block, seen by electrophysiology urology, as well status post thoracentesis.   Subjective:    April Page today has, No headache, No chest pain, No abdominal pain - No Nausea, does report generalized weakness  Assessment  & Plan :    Principal Problem:   Bradycardia Active Problems:   Elevated troponin   Encopresis   Pleural effusion   AKI (acute kidney injury) ([place])  Bradycardia/2-1 AV block -Patient presents with generalized weakness, fatigue, on telemetry overnight heart rate averaging in the 30s to 40s, as bradycardia with significant grade 2 Mobitz 2 AV block. -Not on any AV nodal blocking agent -EP cardiology consult greatly appreciated, plan for permanent pacemaker insertion  Encopresis -Reports bowel incontinence only at night.  No diarrhea or incontinence during the day.  Patient is on home laxatives which could also be contributing. -Hold laxatives  Pleural effusion -No evidence of pneumonia or infection, no dyspnea, no hypoxia, no leukocytosis, lactic acid normal, status post  thoracentesis, will follow on cell count, cytology and work-up to determine if transudate versus exudate. -Encouraged to use incentive spirometry  AKI on CKD 3 -Baseline creatinine is 1, 1.5 on presentation, monitor BMP closely  Hypertension -Blood pressure uncontrolled, resume losartan, continue with PRN hydralazine  Leukopenia and anemia -Likely secondary to history of malignancy and prior chemo. -White count 3.5.  Hemoglobin 9.5, no signs of active bleeding.  Patient denies having any melena or hematochezia. -Continue to monitor  CBC  Hyperlipidemia -Continue statin  CML -Continue home dasatinib  Physical deconditioning -PT evaluation when stable   Code Status : Full code  Family Communication  : Daughter at bedside  Disposition Plan  : Home once stable  Barriers For Discharge : Remains with significant bradycardia, symptomatic, requiring intervention  Consults  : Electrophysiology  Procedures  : Thoracentesis  DVT Prophylaxis  :  SCD  Lab Results  Component Value Date   PLT 176 05/06/2018    Antibiotics  :    Anti-infectives (From admission, onward)   Start     Dose/Rate Route Frequency Ordered Stop   05/07/18 1400  gentamicin (GARAMYCIN) 80 mg in sodium chloride 0.9 % 500 mL irrigation     80 mg Irrigation To Cath Lab 05/07/18 1154 05/08/18 1400   05/07/18 1400  ceFAZolin (ANCEF) IVPB 2g/100 mL premix     2 g 200 mL/hr over 30 Minutes Intravenous To Cath Lab 05/07/18 1154 05/08/18 1400        Objective:   Vitals:   05/06/18 2057 05/06/18 2230 05/07/18 0137 05/07/18 0609  BP: (!) 157/55 (!) 175/52 (!) 178/52 (!) 162/57  Pulse:  (!) 35 (!) 38 (!) 37  Resp:  19 16   Temp:   98.6 F (37 C) 97.6 F (36.4 C)  TempSrc:   Oral Oral  SpO2:  100% 100% 97%  Weight:   65.4 kg   Height:   5\' 4"  (1.626 m)     Wt Readings from Last 3 Encounters:  05/07/18 65.4 kg  12/03/17 67.2 kg  11/12/17 70.3 kg     Intake/Output Summary (Last 24 hours) at  05/07/2018 1404 Last data filed at 05/07/2018 1300 Gross per 24 hour  Intake 301.73 ml  Output 300 ml  Net 1.73 ml     Physical Exam  Awake Alert, Oriented X 3, No new F.N deficits, Normal affect Symmetrical Chest wall movement, no wheezing, diminished entry in the right lung base Bradycardic,No Gallops,Rubs or new Murmurs, No Parasternal Heave +ve B.Sounds, Abd Soft, No tenderness,  No rebound - guarding or rigidity. No Cyanosis, Clubbing or edema, No new Rash or bruise      Data Review:    CBC Recent Labs  Lab 05/06/18 2154  WBC 3.5*  HGB 9.5*  HCT 30.3*  PLT 176  MCV 90.4  MCH 28.4  MCHC 31.4  RDW 15.6*  LYMPHSABS 1.2  MONOABS 0.4  EOSABS 0.0  BASOSABS 0.0    Chemistries  Recent Labs  Lab 05/06/18 2154 05/07/18 0737  NA 141 141  K 4.1 4.2  CL 113* 115*  CO2 20* 16*  GLUCOSE 83 75  BUN 18 14  CREATININE 1.58* 1.26*  CALCIUM 8.3* 8.3*  MG 2.0  --   AST 47*  --   ALT 32  --   ALKPHOS 125  --   BILITOT 0.5  --    ------------------------------------------------------------------------------------------------------------------ No results for input(s): CHOL, HDL, LDLCALC, TRIG, CHOLHDL, LDLDIRECT in the last 72 hours.  Lab Results  Component Value Date   HGBA1C 6.4 (H) 06/03/2016   ------------------------------------------------------------------------------------------------------------------ Recent Labs    05/06/18 2154  TSH 1.431   ------------------------------------------------------------------------------------------------------------------ No results for input(s): VITAMINB12, FOLATE, FERRITIN, TIBC, IRON, RETICCTPCT in the last 72 hours.  Coagulation profile No results for input(s): INR, PROTIME in the last 168 hours.  No results for input(s): DDIMER in the last 72 hours.  Cardiac Enzymes Recent Labs  Lab 05/06/18 2154 05/07/18 0256 05/07/18 1324  TROPONINI 0.03* 0.03* <0.03    ------------------------------------------------------------------------------------------------------------------    Component Value Date/Time   BNP 98.6 05/07/2018 0256    Inpatient Medications  Scheduled Meds: . atorvastatin  40 mg Oral q1800  . dasatinib  100 mg Oral QPM  . gentamicin irrigation  80 mg Irrigation To Cath  . lidocaine      . multivitamin with minerals  1 tablet Oral Daily  . sodium chloride flush  3 mL Intravenous Q12H   Continuous Infusions: . sodium chloride 125 mL/hr at 05/07/18 0830  . sodium chloride    . sodium chloride    .  ceFAZolin (ANCEF) IV     PRN Meds:.acetaminophen **OR** acetaminophen, hydrALAZINE, lidocaine, sodium chloride flush  Micro Results Recent Results (from the past 240 hour(s))  Gram stain     Status: None (Preliminary result)   Collection Time: 05/07/18 11:53 AM  Result Value Ref Range Status   Specimen Description PLEURAL RIGHT  Final   Special Requests NONE  Final   Gram Stain   Final    MODERATE WBC PRESENT, PREDOMINANTLY MONONUCLEAR NO ORGANISMS SEEN Performed at [facility], [place] [address], [place], [place] [address]    Report Status PENDING  Incomplete    Radiology Reports Dg Chest 1 View  Result Date: 05/07/2018 CLINICAL DATA:  Status post thoracentesis. EXAM: CHEST  1 VIEW COMPARISON:  Chest x-ray from earlier same day. FINDINGS: No pneumothorax. The lucency seen on today's earlier exam was a skin fold. There is a new opacity at the RIGHT costophrenic angle, atelectasis versus residual pleural effusion. Heart size and mediastinal contours are stable. IMPRESSION: 1. No pneumothorax. 2. New small opacity at the RIGHT costophrenic angle, atelectasis and/or small residual pleural effusion. Electronically Signed   By: Franki Cabot M.D.   On: 05/07/2018 12:55   Dg Chest 1 View  Result Date: 05/07/2018 CLINICAL DATA:  Status post thoracentesis. EXAM: CHEST  1 VIEW COMPARISON:  Chest x-ray dated 05/06/2018.  FINDINGS: Heart size and mediastinal contours are stable. Improved aeration at the RIGHT lung base status post thoracentesis. No definite pneumothorax. Lucency overlying the lateral aspects of the RIGHT hemithorax is most likely related to skin fold. IMPRESSION: 1. Lucency along the lateral margin of the RIGHT hemithorax is most likely a skin fold, and unlikely to represent pneumothorax. Would consider repeat chest x-ray, with slightly oblique patient positioning, to confirm. 2. Improved aeration at the RIGHT lung base status post thoracentesis. These results will be called to the ordering clinician or representative by the Radiologist Assistant, and communication documented in the PACS or zVision Dashboard. Electronically Signed   By: Franki Cabot M.D.   On: 05/07/2018 11:23   Dg Chest 2 View  Result Date: 05/06/2018 CLINICAL DATA:  Bradycardia. History of hypertension. EXAM: CHEST - 2 VIEW COMPARISON:  08/20/2016 FINDINGS: Shallow inspiration. Loop recorder is present. Cardiac enlargement without vascular congestion. Moderate right pleural effusion with basilar atelectasis or consolidation. This could indicate pneumonia. Left lung is clear. No pneumothorax. Calcified and tortuous aorta. Degenerative changes in the spine. IMPRESSION: Cardiac enlargement. Moderate right pleural effusion with basilar atelectasis or consolidation. Electronically Signed   By: Lucienne Capers M.D.   On: 05/06/2018 20:42   Ir Thoracentesis Asp Pleural Space W/img Guide  Result Date: 05/07/2018 INDICATION: Generalized weakness. Bradycardia. Right pleural effusion. Request for diagnostic and therapeutic thoracentesis. EXAM: ULTRASOUND GUIDED RIGHT THORACENTESIS MEDICATIONS: 1% lidocaine 10 mL COMPLICATIONS: None immediate. PROCEDURE: An ultrasound guided thoracentesis was thoroughly discussed with the  patient and questions answered. The benefits, risks, alternatives and complications were also discussed. The patient understands  and wishes to proceed with the procedure. Written consent was obtained. Ultrasound was performed to localize and mark an adequate pocket of fluid in the right chest. The area was then prepped and draped in the normal sterile fashion. 1% Lidocaine was used for local anesthesia. Under ultrasound guidance a 6 Fr Safe-T-Centesis catheter was introduced. Thoracentesis was performed. The catheter was removed and a dressing applied. FINDINGS: A total of approximately 800 mL of clear amber fluid was removed. Samples were sent to the laboratory as requested by the clinical team. IMPRESSION: Successful ultrasound guided right thoracentesis yielding 800 mL of pleural fluid. No pneumothorax on post-procedure chest x-ray. Read by: Gareth Eagle, PA-C Electronically Signed   By: Sandi Mariscal M.D.   On: 05/07/2018 13:26     Phillips Climes M.D on 05/07/2018 at 2:04 PM  Between 7am to 7pm - Pager - [phone]  After 7pm go to [email or web address] - password [facility]  [facility] -  Office  [phone]

## 2018-05-07 NOTE — Progress Notes (Signed)
  Echocardiogram 2D Echocardiogram has been performed.  Jennette Dubin 05/07/2018, 2:59 PM

## 2018-05-07 NOTE — ED Notes (Signed)
Attempted to call report x 1  

## 2018-05-07 NOTE — Progress Notes (Signed)
Initial Nutrition Assessment  DOCUMENTATION CODES:   Not applicable  INTERVENTION:    Ensure Enlive po BID, each supplement provides 350 kcal and 20 grams of protein  Continue multivitamin daily  NUTRITION DIAGNOSIS:   Increased nutrient needs related to wound healing as evidenced by estimated needs.  GOAL:   Patient will meet greater than or equal to 90% of their needs  MONITOR:   PO intake, Supplement acceptance, I & O's, Skin  REASON FOR ASSESSMENT:   Malnutrition Screening Tool    ASSESSMENT:   73 yo female with PMH of HTN, HLD, CML, CVA, HLD who was admitted with pleural effusion, bradycardia, second-degree Mobitz 2 heart block.  S/P thoracentesis this morning. Plans for pacemaker placement later today. Unable to speak with patient as she was having an ECHO in her room.  Labs reviewed.  Medications reviewed and include multivitamin. Per review of weight encounters, patient has lost 9% of her usual weight within the past 6 months, not significant for the time frame.   NUTRITION - FOCUSED PHYSICAL EXAM:  unable to complete at this time  Diet Order:   Diet Order            Diet NPO time specified Except for: Sips with Meds  Diet effective now              EDUCATION NEEDS:   No education needs have been identified at this time  Skin:  Skin Assessment: Skin Integrity Issues: Skin Integrity Issues:: Unstageable Unstageable: coccyx  Last BM:  2/25  Height:   Ht Readings from Last 1 Encounters:  05/07/18 5\' 4"  (1.626 m)    Weight:   Wt Readings from Last 1 Encounters:  05/07/18 65.4 kg    Ideal Body Weight:  54.5 kg  BMI:  Body mass index is 24.75 kg/m.  Estimated Nutritional Needs:   Kcal:  1500-1700  Protein:  80-90 gm  Fluid:  1.5 L    Molli Barrows, RD, LDN, [place] Pager [phone] After Hours Pager [phone]

## 2018-05-07 NOTE — Consult Note (Addendum)
Cardiology Consultation:   Patient ID: April Page MRN: [id number]; DOB: 1946/03/05  Admit date: 05/06/2018 Date of Consult: 05/07/2018  Primary Care Provider: Redmond School, MD Primary Cardiologist: Dr. Debara Pickett [phone]) Primary Electrophysiologist:  Dr. Curt Bears (2018, ILR implant 2/2 cryptogenic stroke)   Patient Profile:   April Page is a 73 y.o. female with a hx of HTN, HLD, CML (currently in remission on dasatinib), recurrent CVAs who had an ILR implanted in 2018 to monitor for possible AF, who is being seen today for the evaluation of heart block at the request of Dr. Waldron Labs.  History of Present Illness:   April Page was admitted to [facility] yesterday with c/o of about a week of generalized fatigue.  She was observed to have UA with moderate amount of leukocytes, 6-10 WBCs, rare bacteria, and negative nitrite (afebrile, WBC and procalcitonin are both wnl), mod R pleural effusion, planned for thoracentesis,with labs for pleural fluid analysis   (not felt to have pneumonia), presumably because of her h/o CML (?), and observed to have 2:1 AV block.  H&P reports d/w cardiology with plans to monitor the patient and get an echo.  LABS K+ 4.1, 4.2 BUN/Creat 14/1.26Mag 2.0 BNP 98.6 Trop I: 0.03, 0.03,, < 0.03 WBC 3.5 H/H 9.5/30.3 Plts 176 TSH 1.431  Home meds are reviewed, I do not appreciate any potential rate limiting/nodal blocking agents  The patient reports about a week of a sudden onset of weakness.  No strength, unusual fatigue.  No dizzy spells, no near syncope or syncope, no CP, no SOB.  Her daughter mentions that yesterday she was looking at her Mom's feet (keeping an eye on a large callus apparently) noted her legs swollen.  She called the PMD in d/w them with edema, fatigue was recommended to come to the hospital.  She also mentions that in the last 10-14 days to episodes some stool incontinence with sleep.  She denies any further neurological events since 2018,  ambulates at hime with a walker, longer trips uses a wheelchair.  Past Medical History:  Diagnosis Date  . Arthritis    stiff knees  . Blood dyscrasia   . Cancer ([place])   . CVA (cerebral vascular accident) ([place])   . GERD (gastroesophageal reflux disease)   . H/O: CML (chronic myeloid leukemia)   . Hyperlipidemia   . Hyperlipidemia   . Hypertension   . Obesity   . Pessary maintenance 07/01/2013  . Stroke ([place])    x 3    Past Surgical History:  Procedure Laterality Date  . ABDOMINAL HYSTERECTOMY    . CHOLECYSTECTOMY    . COLONOSCOPY  06/09/2002   IOX:BDZHGDJM hemorrhoids; otherwise normal rectum, colon   . COLONOSCOPY N/A 08/25/2013   EQA:STMHDQQ diverticulosis. Single colonic polyp-removed  s/p segmental biopsy and stool sample. random colon bx negative. +benign leiomyoma.  . cystocele/rectocele repair  2009  . ESOPHAGOGASTRODUODENOSCOPY  06/09/2002   IWL:NLGXQJ upper gastrointestinal tract s/p  54-French Maloney dilator  . FEMUR IM NAIL Right 04/12/2016   Procedure: INTRAMEDULLARY (IM) NAIL FEMORAL;  Surgeon: Nicholes Stairs, MD;  Location: [facility] [address];  Service: Orthopedics;  Laterality: Right;  . HERNIA REPAIR    . LOOP RECORDER INSERTION N/A 08/23/2016   Procedure: Loop Recorder Insertion;  Surgeon: Constance Haw, MD;  Location: [facility];  Service: Cardiovascular;  Laterality: N/A;  . OOPHORECTOMY    . TEE WITHOUT CARDIOVERSION N/A 08/23/2016   Procedure: TRANSESOPHAGEAL ECHOCARDIOGRAM (TEE);  Surgeon: Fay Records, MD;  Location: [facility];  Service: Cardiovascular;  Laterality: N/A;     Home Medications:  Prior to Admission medications   Medication Sig Start Date End Date Taking? Authorizing Provider  acetaminophen (TYLENOL) 325 MG tablet Take 2 tablets (650 mg total) by mouth every 4 (four) hours as needed for mild pain (or temp > 37.5 C (99.5 F)). 08/25/16  Yes Barton Dubois, MD  aspirin 325 MG tablet Take 1 tablet (325 mg total) by mouth daily.  06/05/16  Yes Kathie Dike, MD  atorvastatin (LIPITOR) 40 MG tablet Take 1 tablet (40 mg total) by mouth daily at 6 PM. 08/18/16  Yes Samuella Cota, MD  dasatinib (SPRYCEL) 100 MG tablet Take 1 tablet (100 mg total) by mouth daily. Patient taking differently: Take 100 mg by mouth every evening.  02/12/18  Yes Higgs, Mathis Dad, MD  losartan (COZAAR) 100 MG tablet Take 1 tablet (100 mg total) by mouth daily. Resume on 3/28 Patient taking differently: Take 100 mg by mouth daily.  08/18/16  Yes Samuella Cota, MD  Multiple Vitamin (MULTIVITAMIN WITH MINERALS) TABS tablet Take 1 tablet by mouth daily.   Yes [provider]  polyethylene glycol (MIRALAX / GLYCOLAX) packet Take 17 g by mouth daily. Patient taking differently: Take 17 g by mouth daily as needed for mild constipation.  06/06/16  Yes Tat, Shanon Brow, MD  sennosides-docusate sodium (SENOKOT-S) 8.6-50 MG tablet Take 1 tablet by mouth every evening.   Yes [provider]    Inpatient Medications: Scheduled Meds: . atorvastatin  40 mg Oral q1800  . dasatinib  100 mg Oral QPM  . multivitamin with minerals  1 tablet Oral Daily   Continuous Infusions: . sodium chloride     PRN Meds: acetaminophen **OR** acetaminophen, hydrALAZINE  Allergies:   No Known Allergies  Social History:   Social History   Socioeconomic History  . Marital status: Widowed    Spouse name: Not on file  . Number of children: 6  . Years of education: associate  . Highest education level: Not on file  Occupational History    Employer: RETIRED  Social Needs  . Financial resource strain: Not on file  . Food insecurity:    Worry: Not on file    Inability: Not on file  . Transportation needs:    Medical: Not on file    Non-medical: Not on file  Tobacco Use  . Smoking status: Never Smoker  . Smokeless tobacco: Never Used  Substance and Sexual Activity  . Alcohol use: No  . Drug use: No  . Sexual activity: Not Currently    Birth  control/protection: Surgical    Comment: hyst  Lifestyle  . Physical activity:    Days per week: Not on file    Minutes per session: Not on file  . Stress: Not on file  Relationships  . Social connections:    Talks on phone: Not on file    Gets together: Not on file    Attends religious service: Not on file    Active member of club or organization: Not on file    Attends meetings of clubs or organizations: Not on file    Relationship status: Not on file  . Intimate partner violence:    Fear of current or ex partner: Not on file    Emotionally abused: Not on file    Physically abused: Not on file    Forced sexual activity: Not on file  Other Topics Concern  . Not  on file  Social History Narrative  . Not on file    Family History:   Family History  Problem Relation Age of Onset  . Anuerysm Mother        deceased age 64, brain anuerysm  . Early death Mother 26  . Heart disease Father   . Hypertension Sister   . Obesity Brother   . Hypertension Sister   . Arthritis Sister   . Other Son        cardiac arrest  . Heart disease Child 59       cardiac arrest  . Colon cancer Neg Hx      ROS:  Please see the history of present illness.  All other ROS reviewed and negative.     Physical Exam/Data:   Vitals:   05/06/18 2057 05/06/18 2230 05/07/18 0137 05/07/18 0609  BP: (!) 157/55 (!) 175/52 (!) 178/52 (!) 162/57  Pulse:  (!) 35 (!) 38 (!) 37  Resp:  19 16   Temp:   98.6 F (37 C) 97.6 F (36.4 C)  TempSrc:   Oral Oral  SpO2:  100% 100% 97%  Weight:   65.4 kg   Height:   5\' 4"  (1.626 m)     Intake/Output Summary (Last 24 hours) at 05/07/2018 1884 Last data filed at 05/07/2018 0313 Gross per 24 hour  Intake 301.73 ml  Output -  Net 301.73 ml   Last 3 Weights 05/07/2018 05/06/2018 12/03/2017  Weight (lbs) 144 lb 3.2 oz 160 lb 148 lb 1.6 oz  Weight (kg) 65.409 kg 72.576 kg 67.178 kg     Body mass index is 24.75 kg/m.    General:  Well nourished, well  developed, in no acute distress HEENT: normal Lymph: no adenopathy Neck: no JVD Endocrine:  No thryomegaly Vascular: No carotid bruits Cardiac:   RRR; bradycardic, no murmurs, gallops or rubs Lungs:  CTA b/l, no wheezing, rhonchi or rales  Abd: soft, nontender  Ext: no edema  Musculoskeletal:  No deformities, age appropriate atrophy Skin: warm and dry  Neuro:  no gross focal motor abnormalities noted Psych:  Normal affect   EKG:  The EKG was personally reviewed and demonstrates:   2:1 AVBlock, V rate 37, QRS 92 08/20/16: SR, normal intervals  Telemetry:  Telemetry was personally reviewed and demonstrates:   2:1 AVBlock, intermittently she as some 1:1 conduction, QRS remains narrow, V rates 30's-40's  Relevant CV Studies:  08/23/16: TEE Study Conclusions - Left atrium: No evidence of thrombus in the atrial cavity or   appendage. - Atrial septum: Very small PFO as tested with injection of   agitated saline with few bubbles seen in LA.  06/04/16: TTE Study Conclusions - Left ventricle: The cavity size was normal. Wall thickness was   increased in a pattern of moderate LVH. Systolic function was   normal. The estimated ejection fraction was in the range of 60%   to 65%. Doppler parameters are consistent with abnormal left   ventricular relaxation (grade 1 diastolic dysfunction). - Aortic valve: Mildly calcified annulus. Normal thickness   leaflets. Valve area (VTI): 2.61 cm^2. Valve area (Vmax): 2.18   cm^2. Valve area (Vmean): 2.46 cm^2. - Atrial septum: No defect or patent foramen ovale was identified. - Technically difficult study.   Laboratory Data:  Chemistry Recent Labs  Lab 05/06/18 2154 05/07/18 0737  NA 141 141  K 4.1 4.2  CL 113* 115*  CO2 20* 16*  GLUCOSE 83 75  BUN 18  14  CREATININE 1.58* 1.26*  CALCIUM 8.3* 8.3*  GFRNONAA 32* 42*  GFRAA 37* 49*  ANIONGAP 8 10    Recent Labs  Lab 05/06/18 2154  PROT 6.0*  ALBUMIN 2.9*  AST 47*  ALT 32    ALKPHOS 125  BILITOT 0.5   Hematology Recent Labs  Lab 05/06/18 2154  WBC 3.5*  RBC 3.35*  HGB 9.5*  HCT 30.3*  MCV 90.4  MCH 28.4  MCHC 31.4  RDW 15.6*  PLT 176   Cardiac Enzymes Recent Labs  Lab 05/06/18 2154 05/07/18 0256 05/07/18 0737  TROPONINI 0.03* 0.03* <0.03   No results for input(s): TROPIPOC in the last 168 hours.  BNP Recent Labs  Lab 05/07/18 0256  BNP 98.6    DDimer No results for input(s): DDIMER in the last 168 hours.  Radiology/Studies:   Dg Chest 2 View Result Date: 05/06/2018 CLINICAL DATA:  Bradycardia. History of hypertension. EXAM: CHEST - 2 VIEW COMPARISON:  08/20/2016 FINDINGS: Shallow inspiration. Loop recorder is present. Cardiac enlargement without vascular congestion. Moderate right pleural effusion with basilar atelectasis or consolidation. This could indicate pneumonia. Left lung is clear. No pneumothorax. Calcified and tortuous aorta. Degenerative changes in the spine. IMPRESSION: Cardiac enlargement. Moderate right pleural effusion with basilar atelectasis or consolidation. Electronically Signed   By: Lucienne Capers M.D.   On: 05/06/2018 20:42    Assessment and Plan:   1.  2:1 AVblock      BP stable      No reversible causes are appreciated       Recommend PPM implant Discussed with the patient and family rational, the procedure it's benefits and risks, they are agreeable to proceed  2. HTN     No changes   3. H/o recurrent strokes     MDT ILR in place, implant 08/23/16 for cryptogenic stroke, given this brady detection is likely not programmed  4. R pleural effusion     Afebrile, normal WBC, no pulmonary complaints, no symptoms of illness     Not suspect to be a pneumonia     S/p Thoracentesis this AM            For questions or updates, please contact [place] Please consult [email or web address] for contact info under     Signed, Baldwin Jamaica, PA-C  05/07/2018 9:38 AM  I have seen and examined this patient  with Tommye Standard.  Agree with above, note added to reflect my findings.  On exam, bradycardic, no murmurs, lungs clear. Patent admitted and found to have 2:1 AV block with symptomatic bradycardia. No reversible causes. Harlea Goetzinger plan for pacemaker implant.   JANMARIE SMOOT has presented today for surgery, with the diagnosis of second degree AV block.  The various methods of treatment have been discussed with the patient and family. After consideration of risks, benefits and other options for treatment, the patient has consented to  Procedure(s): Pacemaker implant as a surgical intervention .  Risks include but not limited to bleeding, tamponade, infection, pneumothorax, among others. The patient's history has been reviewed, patient examined, no change in status, stable for surgery.  I have reviewed the patient's chart and labs.  Questions were answered to the patient's satisfaction.       Keyshawna Prouse M. Seydina Holliman MD 05/07/2018 3:16 PM

## 2018-05-07 NOTE — Procedures (Signed)
PROCEDURE SUMMARY:  Successful US guided right thoracentesis. Yielded 800 mL of amber fluid. Patient tolerated procedure well. No immediate complications. EBL = trace  Specimen was sent for labs.  Post procedure chest X-ray reveals no pneumothorax  Yoshio Seliga S Damiel Barthold PA-C 05/07/2018 1:27 PM

## 2018-05-07 NOTE — Procedures (Signed)
Echo attempted. Patient bathing. Will attempt later.

## 2018-05-07 NOTE — Discharge Instructions (Signed)
° ° °  Supplemental Discharge Instructions for  °Pacemaker/Defibrillator Patients ° °Activity °No heavy lifting or vigorous activity with your left/right arm for 6 to 8 weeks.  Do not raise your left/right arm above your head for one week.  Gradually raise your affected arm as drawn below. ° °        °    05/11/2018                   05/12/2018                 05/13/2018                 05/14/2018 °__ ° °NO DRIVING for  1 week   ; you may begin driving on  05/14/2018   . ° °WOUND CARE °- Keep the wound area clean and dry.  Do not get this area wet, no showers until cleared to at your wound check visit . °- The tape/steri-strips on your wound will fall off; do not pull them off.  No bandage is needed on the site.  DO  NOT apply any creams, oils, or ointments to the wound area. °- If you notice any drainage or discharge from the wound, any swelling or bruising at the site, or you develop a fever > 101? F after you are discharged home, call the office at once. ° °Special Instructions °- You are still able to use cellular telephones; use the ear opposite the side where you have your pacemaker/defibrillator.  Avoid carrying your cellular phone near your device. °- When traveling through airports, show security personnel your identification card to avoid being screened in the metal detectors.  Ask the security personnel to use the hand wand. °- Avoid arc welding equipment, MRI testing (magnetic resonance imaging), TENS units (transcutaneous nerve stimulators).  Call the office for questions about other devices. °- Avoid electrical appliances that are in poor condition or are not properly grounded. °- Microwave ovens are safe to be near or to operate. ° ° °

## 2018-05-08 ENCOUNTER — Encounter (HOSPITAL_COMMUNITY): Payer: Self-pay | Admitting: Cardiology

## 2018-05-08 ENCOUNTER — Inpatient Hospital Stay (HOSPITAL_COMMUNITY): Payer: Medicare HMO

## 2018-05-08 DIAGNOSIS — Z9889 Other specified postprocedural states: Secondary | ICD-10-CM

## 2018-05-08 LAB — BASIC METABOLIC PANEL
Anion gap: 9 (ref 5–15)
BUN: 10 mg/dL (ref 8–23)
CALCIUM: 8.1 mg/dL — AB (ref 8.9–10.3)
CO2: 20 mmol/L — ABNORMAL LOW (ref 22–32)
Chloride: 109 mmol/L (ref 98–111)
Creatinine, Ser: 1.15 mg/dL — ABNORMAL HIGH (ref 0.44–1.00)
GFR calc Af Amer: 55 mL/min — ABNORMAL LOW (ref >60)
GFR calc non Af Amer: 47 mL/min — ABNORMAL LOW (ref >60)
Glucose, Bld: 79 mg/dL (ref 70–99)
Potassium: 3.9 mmol/L (ref 3.5–5.1)
Sodium: 138 mmol/L (ref 135–145)

## 2018-05-08 LAB — PH, BODY FLUID: PH, BODY FLUID: 7.7

## 2018-05-08 LAB — CBC
HCT: 27 % — ABNORMAL LOW (ref 36.0–46.0)
Hemoglobin: 8.9 g/dL — ABNORMAL LOW (ref 12.0–15.0)
MCH: 28.8 pg (ref 26.0–34.0)
MCHC: 33 g/dL (ref 30.0–36.0)
MCV: 87.4 fL (ref 80.0–100.0)
Platelets: 173 10*3/uL (ref 150–400)
RBC: 3.09 MIL/uL — ABNORMAL LOW (ref 3.87–5.11)
RDW: 15.7 % — AB (ref 11.5–15.5)
WBC: 3.9 10*3/uL — ABNORMAL LOW (ref 4.0–10.5)
nRBC: 0 % (ref 0.0–0.2)

## 2018-05-08 MED ORDER — ENSURE ENLIVE PO LIQD: 237.0000 mL | Freq: Two times a day (BID) | ORAL | 12 refills | Status: AC

## 2018-05-08 MED ORDER — AMLODIPINE BESYLATE 10 MG PO TABS
10.0000 mg | ORAL_TABLET | Freq: Every day | ORAL | Status: DC
  Administered 2018-05-08: 10 mg via ORAL
  Filled 2018-05-08: qty 1

## 2018-05-08 MED ORDER — HYDRALAZINE HCL 25 MG PO TABS: 25.0000 mg | ORAL_TABLET | Freq: Two times a day (BID) | ORAL | 0 refills | Status: DC

## 2018-05-08 MED ORDER — HYDRALAZINE HCL 50 MG PO TABS
50.0000 mg | ORAL_TABLET | Freq: Once | ORAL | Status: AC
  Administered 2018-05-08: 50 mg via ORAL
  Filled 2018-05-08: qty 1

## 2018-05-08 MED ORDER — HYDRALAZINE HCL 20 MG/ML IJ SOLN: 10.0000 mg | INTRAMUSCULAR | Status: DC | PRN

## 2018-05-08 MED ORDER — ACETAMINOPHEN 325 MG PO TABS: 650.0000 mg | ORAL_TABLET | Freq: Four times a day (QID) | ORAL | Status: AC | PRN

## 2018-05-08 MED ORDER — AMLODIPINE BESYLATE 10 MG PO TABS: 10.0000 mg | ORAL_TABLET | Freq: Every day | ORAL | 0 refills | Status: AC

## 2018-05-08 MED ORDER — AMLODIPINE BESYLATE 10 MG PO TABS: 10.0000 mg | ORAL_TABLET | Freq: Every day | ORAL | Status: DC

## 2018-05-08 MED FILL — Fentanyl Citrate Preservative Free (PF) Inj 100 MCG/2ML: INTRAMUSCULAR | Qty: 2 | Status: AC

## 2018-05-08 MED FILL — Lidocaine HCl Local Inj 1%: INTRAMUSCULAR | Qty: 80 | Status: AC

## 2018-05-08 MED FILL — Midazolam HCl Inj 5 MG/5ML (Base Equivalent): INTRAMUSCULAR | Qty: 5 | Status: AC

## 2018-05-08 NOTE — Progress Notes (Addendum)
Progress Note  Patient Name: April Page Date of Encounter: 05/08/2018  Primary Electrophysiologist: Dr. Curt Bears   Subjective   Feels well, can tell a clear improvement post pacer, no CP or SOB, minimal site discomfort (at both)  Inpatient Medications    Scheduled Meds: . atorvastatin  40 mg Oral q1800  . Chlorhexidine Gluconate Cloth  6 each Topical Q0600  . dasatinib  100 mg Oral QPM  . feeding supplement (ENSURE ENLIVE)  237 mL Oral BID BM  . losartan  100 mg Oral Daily  . multivitamin with minerals  1 tablet Oral Daily  . mupirocin ointment  1 application Nasal BID   Continuous Infusions:  PRN Meds: acetaminophen **OR** acetaminophen, hydrALAZINE, lidocaine, ondansetron (ZOFRAN) IV   Vital Signs    Vitals:   05/07/18 2300 05/07/18 2330 05/08/18 0230 05/08/18 0602  BP: (!) 141/76 (!) 152/78 (!) 151/76 (!) 174/78  Pulse:    74  Resp:    18  Temp:      TempSrc:      SpO2:      Weight:    65.7 kg  Height:        Intake/Output Summary (Last 24 hours) at 05/08/2018 0801 Last data filed at 05/08/2018 0303 Gross per 24 hour  Intake 50 ml  Output 500 ml  Net -450 ml   Last 3 Weights 05/08/2018 05/07/2018 05/06/2018  Weight (lbs) 144 lb 13.5 oz 144 lb 3.2 oz 160 lb  Weight (kg) 65.7 kg 65.409 kg 72.576 kg      Telemetry    SR, V paced - Personally Reviewed  ECG    SR/V paced - Personally Reviewed  Physical Exam   GEN: No acute distress.   Neck: No JVD Cardiac: RRR, no murmurs, rubs, or gallops.  Respiratory: CTA b/l. GI: Soft, nontender, non-distended  MS: No edema; No deformity. Neuro:  Nonfocal  Psych: Normal affect   PPM site is dry, no hematoma ILR extraction site is dry, no hematoma  Labs    Chemistry Recent Labs  Lab 05/06/18 2154 05/07/18 0737 05/07/18 1508 05/08/18 0410  NA 141 141  --  138  K 4.1 4.2  --  3.9  CL 113* 115*  --  109  CO2 20* 16*  --  20*  GLUCOSE 83 75  --  79  BUN 18 14  --  10  CREATININE 1.58* 1.26*  --   1.15*  CALCIUM 8.3* 8.3*  --  8.1*  PROT 6.0*  --  6.4*  --   ALBUMIN 2.9*  --   --   --   AST 47*  --   --   --   ALT 32  --   --   --   ALKPHOS 125  --   --   --   BILITOT 0.5  --   --   --   GFRNONAA 32* 42*  --  47*  GFRAA 37* 49*  --  55*  ANIONGAP 8 10  --  9     Hematology Recent Labs  Lab 05/06/18 2154 05/08/18 0410  WBC 3.5* 3.9*  RBC 3.35* 3.09*  HGB 9.5* 8.9*  HCT 30.3* 27.0*  MCV 90.4 87.4  MCH 28.4 28.8  MCHC 31.4 33.0  RDW 15.6* 15.7*  PLT 176 173    Cardiac Enzymes Recent Labs  Lab 05/06/18 2154 05/07/18 0256 05/07/18 0737  TROPONINI 0.03* 0.03* <0.03   No results for input(s): TROPIPOC in the last  168 hours.   BNP Recent Labs  Lab 05/07/18 0256  BNP 98.6     DDimer No results for input(s): DDIMER in the last 168 hours.   Radiology    Dg Chest 1 View Result Date: 05/07/2018 CLINICAL DATA:  Status post thoracentesis. EXAM: CHEST  1 VIEW COMPARISON:  Chest x-ray from earlier same day. FINDINGS: No pneumothorax. The lucency seen on today's earlier exam was a skin fold. There is a new opacity at the RIGHT costophrenic angle, atelectasis versus residual pleural effusion. Heart size and mediastinal contours are stable. IMPRESSION: 1. No pneumothorax. 2. New small opacity at the RIGHT costophrenic angle, atelectasis and/or small residual pleural effusion. Electronically Signed   By: Franki Cabot M.D.   On: 05/07/2018 12:55     Ir Thoracentesis Asp Pleural Space W/img Guide Result Date: 05/07/2018 INDICATION: Generalized weakness. Bradycardia. Right pleural effusion. Request for diagnostic and therapeutic thoracentesis. EXAM: ULTRASOUND GUIDED RIGHT THORACENTESIS MEDICATIONS: 1% lidocaine 10 mL COMPLICATIONS: None immediate. PROCEDURE: An ultrasound guided thoracentesis was thoroughly discussed with the patient and questions answered. The benefits, risks, alternatives and complications were also discussed. The patient understands and wishes to proceed  with the procedure. Written consent was obtained. Ultrasound was performed to localize and mark an adequate pocket of fluid in the right chest. The area was then prepped and draped in the normal sterile fashion. 1% Lidocaine was used for local anesthesia. Under ultrasound guidance a 6 Fr Safe-T-Centesis catheter was introduced. Thoracentesis was performed. The catheter was removed and a dressing applied. FINDINGS: A total of approximately 800 mL of clear amber fluid was removed. Samples were sent to the laboratory as requested by the clinical team. IMPRESSION: Successful ultrasound guided right thoracentesis yielding 800 mL of pleural fluid. No pneumothorax on post-procedure chest x-ray. Read by: Gareth Eagle, PA-C Electronically Signed   By: Sandi Mariscal M.D.   On: 05/07/2018 13:26    Cardiac Studies   05/07/2018: TTE IMPRESSIONS  1. The left ventricle has normal systolic function with an ejection fraction of 60-65%. The cavity size was normal. There is mildly increased left ventricular wall thickness. Left ventricular diastolic Doppler parameters are indeterminate.  2. The right ventricle has normal systolic function. The cavity was normal. There is no increase in right ventricular wall thickness.  3. The mitral valve is normal in structure. Mild thickening of the mitral valve leaflet. Mild calcification of the mitral valve leaflet.  4. The tricuspid valve is normal in structure.  5. The aortic valve has an indeterminant number of cusps Moderate thickening of the aortic valve Sclerosis without any evidence of stenosis of the aortic valve.  6. Mean gradient 8 mmHg AVA over 2.0 cm 2.  7. The pulmonic valve was normal in structure.  8. The inferior vena cava was dilated in size with <50% respiratory variability.  Patient Profile     73 y.o. female  with a hx of HTN, HLD, CML (currently in remission on dasatinib), recurrent CVAs who had an ILR implanted in 2018, admitted with week of generalized  weakness, unusual fatigue, LE swelling, found in 2:1 AVBlock primarily, R pleural effusion  Assessment & Plan    1.  2:1 AVblock      No reversible causes are appreciated      now s/p PPM implant (loop extraction) yesterday with Dr. Curt Bears  Both procedure sites are stable, no bleeding or hematoma Device interrogation done this AM with intact function, stable measurements CXR reviewed by Dr. Curt Bears, stable lead  position, no obvious PTX await official read Wound care and activity restrictions were discussed with the patient Routine EP follow up is in place No new medication recommendations  2. HTN     not completely controlled, Merit Maybee defer to medicine team  3. H/o recurrent strokes     MDT ILR in place, implant 08/23/16 for cryptogenic stroke,     ILR extracted yesterday, Elvia Aydin monitor rhythm via PPM  4. R pleural effusion     Afebrile, normal WBC, no pulmonary complaints, no symptoms of illness     Not suspect to be a pneumonia     S/p Thoracentesis yesterday, 859ml, no ptx post        [facility] Richel Millspaugh sign off.   Medication Recommendations:  none Other recommendations (labs, testing, etc):  Pending official CXR read, if no ptx post pacer, nothing further Follow up as an outpatient:  Is in place  For questions or updates, please contact [facility] Please consult [email or web address] for contact info under        Signed, Baldwin Jamaica, PA-C  05/08/2018, 8:01 AM    I have seen and examined this patient with Tommye Standard.  Agree with above, note added to reflect my findings.  On exam, RRR, no murmurs, lungs clear.  She is now status post medtronic dual chamber pacemaker for second degree AV block.  Device functioning appropriately.  Chest x-ray and interrogation without issue.  OK for discharge today with follow-up in device clinic.  Aleene Swanner M. Jnya Brossard MD 05/08/2018 8:13 AM

## 2018-05-08 NOTE — Care Management Important Message (Signed)
Important Message  Patient Details  Name: CELINA SHILEY MRN: [id number] Date of Birth: 1945/10/07   Medicare Important Message Given:  Yes    Bethena Roys, RN 05/08/2018, 11:56 AM

## 2018-05-08 NOTE — Progress Notes (Signed)
Physical Therapy Treatment Patient Details Name: April Page MRN: [id number] DOB: 1945/04/16 Today's Date: 05/08/2018    History of Present Illness Pt is a 73 y.o. female admitted 05/06/18 with c/o generalized weakness and leaky stool. Worked up for bradycardia; potential UTI. CXR with moderate R pleural effusion. Also AKI on CKD3. S/p thoracentesis 2/27. S/p pacemaker implant 2/27. PMH includes HTN, HLD, CVA.   PT Comments    Pt progressing well with mobility. Increased ambulation distance this session with RW at supervision-level. Another daughter Angela Nevin) present this session to discuss home needs; own necessary DME. Reinforced pacemaker precautions. HR 82-104 this session. Pt hopeful for d/c home today.    Follow Up Recommendations  Home health PT;Supervision for mobility/OOB     Equipment Recommendations  None recommended by PT    Recommendations for Other Services       Precautions / Restrictions Precautions Precautions: Fall;ICD/Pacemaker Restrictions Weight Bearing Restrictions: No    Mobility  Bed Mobility Overal bed mobility: Modified Independent                Transfers Overall transfer level: Needs assistance Equipment used: Rolling walker (2 wheeled) Transfers: Sit to/from Stand Sit to Stand: Supervision            Ambulation/Gait Ambulation/Gait assistance: Supervision Gait Distance (Feet): 96 Feet Assistive device: Rolling walker (2 wheeled) Gait Pattern/deviations: Step-through pattern;Decreased stride length;Trunk flexed Gait velocity: Decreased Gait velocity interpretation: <1.31 ft/sec, indicative of household ambulator General Gait Details: Very slow, but steady gait with RW; supervision for safety. Daughter present and reports pt actually walking faster than baseline gait speed   Stairs             Wheelchair Mobility    Modified Rankin (Stroke Patients Only)       Balance Overall balance assessment: Needs assistance   Sitting balance-Leahy Scale: Good       Standing balance-Leahy Scale: Fair                              Cognition Arousal/Alertness: Awake/alert Behavior During Therapy: Flat affect Overall Cognitive Status: History of cognitive impairments - at baseline                                 General Comments: Daughter reports baseline cognition generally slowed processing. Answering questions and interacting appropriately      Exercises      General Comments General comments (skin integrity, edema, etc.): Daughter Angela Nevin) present during session; reports owns necessary DME. Educ pt and daughter on pacemaker precautions. HR 82-104      Pertinent Vitals/Pain Pain Assessment: No/denies pain    Home Living                      Prior Function            PT Goals (current goals can now be found in the care plan section) Acute Rehab PT Goals Patient Stated Goal: Return home with more frequent supervision; daughter hopeful for aide support PT Goal Formulation: With patient/family Time For Goal Achievement: 05/21/18 Potential to Achieve Goals: Good Progress towards PT goals: Progressing toward goals    Frequency    Min 3X/week      PT Plan Current plan remains appropriate    Co-evaluation  AM-PAC PT "6 Clicks" Mobility   Outcome Measure  Help needed turning from your back to your side while in a flat bed without using bedrails?: None Help needed moving from lying on your back to sitting on the side of a flat bed without using bedrails?: None Help needed moving to and from a bed to a chair (including a wheelchair)?: A Little Help needed standing up from a chair using your arms (e.g., wheelchair or bedside chair)?: A Little Help needed to walk in hospital room?: A Little Help needed climbing 3-5 steps with a railing? : A Lot 6 Click Score: 19    End of Session Equipment Utilized During Treatment: Gait belt Activity  Tolerance: Patient tolerated treatment well Patient left: in bed;with call bell/phone within reach;with family/visitor present Nurse Communication: Mobility status PT Visit Diagnosis: Other abnormalities of gait and mobility (R26.89)     Time: 1100-1118 PT Time Calculation (min) (ACUTE ONLY): 18 min  Charges:  $Gait Training: 8-22 mins                    Mabeline Caras, PT, DPT Acute Rehabilitation Services  Pager [phone] Office [place] 05/08/2018, 12:04 PM

## 2018-05-08 NOTE — Consult Note (Signed)
            [facility] CM Primary Care Navigator  05/08/2018  April Page Apr 03, 1945 [id number]   Went to see patientat the bedside to identify possible discharge needs butshewasalreadydischargedperstaff. [facility]home [facility] per therapy recommendation.  Per chart review, patient presented with generalized weakness and bradycardia, work-up significant for pleural effusion, second degree heart block. (symptomatic bradycardia, acute kidney injury, HTN, physical deconditioning)  Patient has discharge instruction to follow-up withprimary care provider in 1 week, cardiology, and oncology follow-up in 2 weeks.  Primary care provider's office is listed as providing transition of care (TOC) follow-up.   For additional questions please contact:  Edwena Felty A. Shaylea Ucci, BSN, RN-BC [facility] PRIMARY CARE Navigator Cell: [phone]

## 2018-05-08 NOTE — Progress Notes (Signed)
Orthopedic Tech Progress Note Patient Details:  April Page Dec 04, 1945 [id number] RN said patient has on arm sling already  Patient ID: [name], female   DOB: 1946/01/04, 73 y.o.   MRN: [id number]   Janit Pagan 05/08/2018, 7:38 AM

## 2018-05-08 NOTE — Progress Notes (Signed)
CXR is without pneumothorax, OK to discharge from [place], PA-C

## 2018-05-08 NOTE — Care Management Note (Signed)
Case Management Note  Patient Details  Name: April Page MRN: [id number] Date of Birth: [birth year]  Subjective/Objective: Pt presented for generalized weakness and bradycardia. PTA from home alone. However, has support of daugther April Page. PT recommendations for home with Home Health Services.                   Action/Plan: CM did offer [employer] list. Daughter April Page chose [facility]. Referral made to [facility] with [facility]- Awaiting call back to see if can accept.  Expected Discharge Date:  05/08/18               Expected Discharge Plan:  [place]  In-House Referral:  NA  Discharge planning Services  CM Consult  Post Acute Care Choice:  Home Health Choice offered to:  Patient, Adult Children  DME Arranged:  N/A DME Agency:  NA  HH Arranged:  PT, Nurse's Aide, Registered Nurse, Occupational Therapy HH Agency:  [facility]  Status of Service:  Completed, signed off  If discussed at [place] of Stay Meetings, dates discussed:    Additional Comments: 1219 05-08-18 Jacqlyn Krauss, RN,BSN [phone] CM did speak with liaison Dorian Pod and she is agreeable to accept the patient. Patient with elevated BP- may benefit from [facility] RN as well. CM will speak with MD regarding disciplines. SOC to begin within 24-48 hours post transition home. No further needs at this time.     Bethena Roys, RN 05/08/2018, 11:57 AM

## 2018-05-08 NOTE — Progress Notes (Signed)
Patient discharged home with daughters in stable condition. AVS reviewed and questions answered. IV removed in tact.

## 2018-05-08 NOTE — Discharge Summary (Signed)
April Page, is a 73 y.o. female  DOB March 21, 1945  MRN [id number].  Admission date:  05/06/2018  Admitting Physician  Shela Leff, MD  Discharge Date:  05/08/2018   Primary MD  Redmond School, MD  Recommendations for primary care physician for things to follow:  -Check CBC, BMP during next visit -Continue monitoring of blood pressure and titrate antihypertensive medication    Admission Diagnosis  Symptomatic bradycardia [R00.1] AKI (acute kidney injury) ([place]) [N17.9]   Discharge Diagnosis  Symptomatic bradycardia [R00.1] AKI (acute kidney injury) ([place]) [N17.9]    Principal Problem:   Bradycardia Active Problems:   Elevated troponin   Encopresis   Pleural effusion   AKI (acute kidney injury) ([place])      Past Medical History:  Diagnosis Date  . Arthritis    stiff knees  . Blood dyscrasia   . Cancer ([place])   . CVA (cerebral vascular accident) ([place])   . GERD (gastroesophageal reflux disease)   . H/O: CML (chronic myeloid leukemia)   . Hyperlipidemia   . Hyperlipidemia   . Hypertension   . Obesity   . Pessary maintenance 07/01/2013  . Stroke ([place])    x 3    Past Surgical History:  Procedure Laterality Date  . ABDOMINAL HYSTERECTOMY    . CHOLECYSTECTOMY    . COLONOSCOPY  06/09/2002   LFY:BOFBPZWC hemorrhoids; otherwise normal rectum, colon   . COLONOSCOPY N/A 08/25/2013   HEN:IDPOEUM diverticulosis. Single colonic polyp-removed  s/p segmental biopsy and stool sample. random colon bx negative. +benign leiomyoma.  . cystocele/rectocele repair  2009  . ESOPHAGOGASTRODUODENOSCOPY  06/09/2002   PNT:IRWERX upper gastrointestinal tract s/p  54-French Maloney dilator  . FEMUR IM NAIL Right 04/12/2016   Procedure: INTRAMEDULLARY (IM) NAIL FEMORAL;  Surgeon: Nicholes Stairs, MD;  Location: [facility];  Service: Orthopedics;  Laterality: Right;  . HERNIA REPAIR    . IR THORACENTESIS  ASP PLEURAL SPACE W/IMG GUIDE  05/07/2018  . LOOP RECORDER INSERTION N/A 08/23/2016   Procedure: Loop Recorder Insertion;  Surgeon: Constance Haw, MD;  Location: [facility];  Service: Cardiovascular;  Laterality: N/A;  . LOOP RECORDER REMOVAL N/A 05/07/2018   Procedure: LOOP RECORDER REMOVAL;  Surgeon: Constance Haw, MD;  Location: [facility];  Service: Cardiovascular;  Laterality: N/A;  . OOPHORECTOMY    . PACEMAKER IMPLANT N/A 05/07/2018   Procedure: PACEMAKER IMPLANT;  Surgeon: Constance Haw, MD;  Location: [facility];  Service: Cardiovascular;  Laterality: N/A;  . TEE WITHOUT CARDIOVERSION N/A 08/23/2016   Procedure: TRANSESOPHAGEAL ECHOCARDIOGRAM (TEE);  Surgeon: Fay Records, MD;  Location: [facility] ENDOSCOPY;  Service: Cardiovascular;  Laterality: N/A;       History of present illness and  Hospital Course:     Kindly see H&P for history of present illness and admission details, please review complete Labs, Consult reports and Test reports for all details in brief  HPI  from the history and physical done on the day of admission 05/06/2018  HPI: April Page is a 73 y.o. female with medical history significant of hypertension, hyperlipidemia, prior CVA, CML currently in remission on dasatinib. Patient did not have any complaints.  Upon repeated questioning history could be obtained from her.  Reported having generalized weakness for the past 1 week.  Reports noticing leakage of stool only at night.  No diarrhea, melena, or hematochezia.  States her stools are normal during the day.  Daughter at bedside states she thinks patient's legs are swollen.  Patient denies any history of bradycardia.  Denies having any chest pain, dizziness, or episodes of syncope.  Denies having any shortness of breath.  Reports having some cough for a week.  Denies having any fevers or chills.  States food has been tasting bad and she has not been eating much.  Denies having any  dysuria, urinary frequency, or urgency.  No additional history could be obtained.   Hospital Course   73 y.o.femalewith medical history significant ofhypertension, hyperlipidemia, prior CVA, CML currently in remission on dasatinib.  Patient presents with complaints of generalized weakness, her work-up was significant for pleural effusion, and bradycardia, noticed to be in second-degree Mobitz 2 heart block, seen by electrophysiology cardiology, had thoracentecis by IR as well.  Bradycardia/2-1 AV block -Patient presents with generalized weakness, fatigue, on telemetry overnight heart rate averaging in the 30s to 40s, as bradycardia with significant grade 2 Mobitz 2 AV block. -Not on any AV nodal blocking agent -EP cardiology consult greatly appreciated, s/p pacemaker insertion and loop recorder extraction 05/07/2018 by April Page interrogation by EP at day of discharge with intact function, stable measurements  Encopresis -Reports bowel incontinence only at night. No diarrhea or incontinence during the day. Patient is on home laxatives which could also be contributing. -Hold laxatives  Right pleural effusion -No evidence of pneumonia or infection, no dyspnea, no hypoxia, no leukocytosis, lactic acid normal, status post thoracentesis, 800 cc drained, Gram stain is negative, only reactive mesothelial cells with lymphocytosis, but no malignant cells , Significant for transudate as LDH ratio 0.56, and protein ratio less than 0.5   AKI on CKD 3 -Baseline creatinine is 1, 1.5 on presentation, also, back at baseline at 1.15  Hypertension -Blood pressure uncontrolled, on resumption of losartan, she was started on Norvasc, blood pressure remained elevated, have added low-dose hydralazine as well  Leukopenia and anemia -Likely secondary to history of malignancy and prior chemo. -White count 3.5. Hemoglobin 9.5,no signs of active bleeding. Patient denies having any melena or  hematochezia. -Continue to monitor CBC  Hyperlipidemia -Continue statin  CML -Continue home dasatinib  Physical deconditioning -Home health arranged  Sacral pressure injury stage II   Discharge Condition:  Stable  Follow UP  Follow-up Information    [facility].   Specialty:  Cardiology Contact information: [address], [place] [phone]       Constance Haw, MD .   Specialty:  Cardiology Contact information: [place] [address] [phone]        Zoila Shutter, MD Follow up in 2 week(s).   Specialty:  Oncology Contact information: [place] [address] [phone]        Redmond School, MD Follow up in 1 week(s).   Specialty:  Internal Medicine Contact information: [address] [place] [address] [phone]        Golda Acre, [facility]  up.   Specialty:  [place] Why:  Registered Nurse, Occupational Therapy, Physical Therapy, Aide Contact information: [place] [address] [phone]             Discharge Instructions  and  Discharge Medications    Discharge Instructions    Discharge instructions   Complete by:  As directed    Follow with Primary MD Redmond School, MD in 7 days   Get CBC, CMP, 2 view Chest X ray checked  by Primary MD next visit.    Activity: As tolerated with Full fall precautions use walker/cane & assistance as needed   Disposition Home    Diet: Heart Healthy  , with feeding assistance and aspiration precautions.  For Heart failure patients - Check your Weight same time everyday, if you gain over 2 pounds, or you develop in leg swelling, experience more shortness of breath or chest pain, call your Primary MD immediately. Follow Cardiac Low Salt Diet and 1.5 lit/day fluid restriction.   On your next visit  with your primary care physician please Get Medicines reviewed and adjusted.   Please request your Prim.MD to go over all Hospital Tests and Procedure/Radiological results at the follow up, please get all Hospital records sent to your Prim MD by signing hospital release before you go home.   If you experience worsening of your admission symptoms, develop shortness of breath, life threatening emergency, suicidal or homicidal thoughts you must seek medical attention immediately by calling 911 or calling your MD immediately  if symptoms less severe.  You Must read complete instructions/literature along with all the possible adverse reactions/side effects for all the Medicines you take and that have been prescribed to you. Take any new Medicines after you have completely understood and accpet all the possible adverse reactions/side effects.   Do not drive, operating heavy machinery, perform activities at heights, swimming or participation in water activities or provide baby sitting services if your were admitted for syncope or siezures until you have seen by Primary MD or a Neurologist and advised to do so again.  Do not drive when taking Pain medications.    Do not take more than prescribed Pain, Sleep and Anxiety Medications  Special Instructions: If you have smoked or chewed Tobacco  in the last 2 yrs please stop smoking, stop any regular Alcohol  and or any Recreational drug use.  Wear Seat belts while driving.   Please note  You were cared for by a hospitalist during your hospital stay. If you have any questions about your discharge medications or the care you received while you were in the hospital after you are discharged, you can call the unit and asked to speak with the hospitalist on call if the hospitalist that took care of you is not available. Once you are discharged, your primary care physician will handle any further medical issues. Please note that NO REFILLS for any discharge  medications will be authorized once you are discharged, as it is imperative that you return to your primary care physician (or establish a relationship with a primary care physician if you do not have one) for your aftercare needs so that they can reassess your need for medications and monitor your lab values.   Increase activity slowly   Complete by:  As directed      Allergies as of 05/08/2018   No Known Allergies     Medication List    TAKE these medications  acetaminophen 325 MG tablet Commonly known as:  TYLENOL Take 2 tablets (650 mg total) by mouth every 6 (six) hours as needed for mild pain (or temp > 37.5 C (99.5 F)). What changed:  when to take this   amLODipine 10 MG tablet Commonly known as:  NORVASC Take 1 tablet (10 mg total) by mouth daily. Start taking on:  May 09, 2018   aspirin 325 MG tablet Take 1 tablet (325 mg total) by mouth daily.   atorvastatin 40 MG tablet Commonly known as:  LIPITOR Take 1 tablet (40 mg total) by mouth daily at 6 PM.   dasatinib 100 MG tablet Commonly known as:  SPRYCEL Take 1 tablet (100 mg total) by mouth daily. What changed:  when to take this   feeding supplement (ENSURE ENLIVE) Liqd Take 237 mLs by mouth 2 (two) times daily between meals.   hydrALAZINE 25 MG tablet Commonly known as:  APRESOLINE Take 1 tablet (25 mg total) by mouth 2 (two) times daily.   losartan 100 MG tablet Commonly known as:  COZAAR Take 1 tablet (100 mg total) by mouth daily. Resume on 3/28 What changed:  additional instructions   multivitamin with minerals Tabs tablet Take 1 tablet by mouth daily.   polyethylene glycol packet Commonly known as:  MIRALAX / GLYCOLAX Take 17 g by mouth daily. What changed:    when to take this  reasons to take this   sennosides-docusate sodium 8.6-50 MG tablet Commonly known as:  SENOKOT-S Take 1 tablet by mouth every evening.         Diet and Activity recommendation: See Discharge Instructions  above   Consults obtained -  Cardiology, electrophysiology   Major procedures and Radiology Reports - PLEASE review detailed and final reports for all details, in brief -      Dg Chest 1 View  Result Date: 05/07/2018 CLINICAL DATA:  Status post thoracentesis. EXAM: CHEST  1 VIEW COMPARISON:  Chest x-ray from earlier same day. FINDINGS: No pneumothorax. The lucency seen on today's earlier exam was a skin fold. There is a new opacity at the RIGHT costophrenic angle, atelectasis versus residual pleural effusion. Heart size and mediastinal contours are stable. IMPRESSION: 1. No pneumothorax. 2. New small opacity at the RIGHT costophrenic angle, atelectasis and/or small residual pleural effusion. Electronically Signed   By: Franki Cabot M.D.   On: 05/07/2018 12:55   Dg Chest 1 View  Result Date: 05/07/2018 CLINICAL DATA:  Status post thoracentesis. EXAM: CHEST  1 VIEW COMPARISON:  Chest x-ray dated 05/06/2018. FINDINGS: Heart size and mediastinal contours are stable. Improved aeration at the RIGHT lung base status post thoracentesis. No definite pneumothorax. Lucency overlying the lateral aspects of the RIGHT hemithorax is most likely related to skin fold. IMPRESSION: 1. Lucency along the lateral margin of the RIGHT hemithorax is most likely a skin fold, and unlikely to represent pneumothorax. Would consider repeat chest x-ray, with slightly oblique patient positioning, to confirm. 2. Improved aeration at the RIGHT lung base status post thoracentesis. These results will be called to the ordering clinician or representative by the Radiologist Assistant, and communication documented in the PACS or zVision Dashboard. Electronically Signed   By: Franki Cabot M.D.   On: 05/07/2018 11:23   Dg Chest 2 View  Result Date: 05/08/2018 CLINICAL DATA:  Status post pacemaker placement. EXAM: CHEST - 2 VIEW COMPARISON:  Single-view of the chest 05/08/2007. FINDINGS: New dual lead pacing device is in place from a  left  subclavian approach. Leads are intact with their tips in the right atrium and right ventricle. No pneumothorax. There is cardiomegaly without edema. Small bilateral pleural effusions and basilar atelectasis noted. IMPRESSION: Leads of pacing device are in the right atrium and right ventricle. No pneumothorax. Cardiomegaly without edema. Small pleural effusions and basilar atelectasis. Electronically Signed   By: Inge Rise M.D.   On: 05/08/2018 09:40   Dg Chest 2 View  Result Date: 05/06/2018 CLINICAL DATA:  Bradycardia. History of hypertension. EXAM: CHEST - 2 VIEW COMPARISON:  08/20/2016 FINDINGS: Shallow inspiration. Loop recorder is present. Cardiac enlargement without vascular congestion. Moderate right pleural effusion with basilar atelectasis or consolidation. This could indicate pneumonia. Left lung is clear. No pneumothorax. Calcified and tortuous aorta. Degenerative changes in the spine. IMPRESSION: Cardiac enlargement. Moderate right pleural effusion with basilar atelectasis or consolidation. Electronically Signed   By: Lucienne Capers M.D.   On: 05/06/2018 20:42   Ir Thoracentesis Asp Pleural Space W/img Guide  Result Date: 05/07/2018 INDICATION: Generalized weakness. Bradycardia. Right pleural effusion. Request for diagnostic and therapeutic thoracentesis. EXAM: ULTRASOUND GUIDED RIGHT THORACENTESIS MEDICATIONS: 1% lidocaine 10 mL COMPLICATIONS: None immediate. PROCEDURE: An ultrasound guided thoracentesis was thoroughly discussed with the patient and questions answered. The benefits, risks, alternatives and complications were also discussed. The patient understands and wishes to proceed with the procedure. Written consent was obtained. Ultrasound was performed to localize and mark an adequate pocket of fluid in the right chest. The area was then prepped and draped in the normal sterile fashion. 1% Lidocaine was used for local anesthesia. Under ultrasound guidance a 6 Fr  Safe-T-Centesis catheter was introduced. Thoracentesis was performed. The catheter was removed and a dressing applied. FINDINGS: A total of approximately 800 mL of clear amber fluid was removed. Samples were sent to the laboratory as requested by the clinical team. IMPRESSION: Successful ultrasound guided right thoracentesis yielding 800 mL of pleural fluid. No pneumothorax on post-procedure chest x-ray. Read by: Gareth Eagle, PA-C Electronically Signed   By: Sandi Mariscal M.D.   On: 05/07/2018 13:26    Micro Results    Recent Results (from the past 240 hour(s))  Gram stain     Status: None   Collection Time: 05/07/18 11:53 AM  Result Value Ref Range Status   Specimen Description PLEURAL RIGHT  Final   Special Requests NONE  Final   Gram Stain   Final    MODERATE WBC PRESENT, PREDOMINANTLY MONONUCLEAR NO ORGANISMS SEEN Performed at [facility], [address], [place], [place] [address]    Report Status 05/07/2018 FINAL  Final  Culture, body fluid-bottle     Status: None (Preliminary result)   Collection Time: 05/07/18 11:53 AM  Result Value Ref Range Status   Specimen Description PLEURAL RIGHT  Final   Special Requests   Final    NONE Performed at [facility], [place] [address] [place], [place] [address]    Culture NO GROWTH 1 DAY  Final   Report Status PENDING  Incomplete  Surgical PCR screen     Status: Abnormal   Collection Time: 05/07/18  2:48 PM  Result Value Ref Range Status   MRSA, PCR POSITIVE (A) NEGATIVE Final    Comment: RESULT CALLED TO, READ BACK BY AND VERIFIED WITH: Sandria Senter 16:45 05/07/18 ([email or web address])    Staphylococcus aureus POSITIVE (A) NEGATIVE Final    Comment: (NOTE) The Xpert SA Assay (FDA approved for NASAL specimens in patients 69 years of age and  older), is one component of a comprehensive surveillance program. It is not intended to diagnose infection nor to guide or monitor treatment. Performed at [facility], [place] [address]  [place], [place] [address]        Today   Subjective:   Calandria Mullings today has no headache,no chest or abdominal pain,no new weakness tingling or numbness, feels much better wants to go home today.   Objective:   Blood pressure (!) 149/72, pulse 74, temperature 99.1 F (37.3 C), temperature source Oral, resp. rate 18, height 5\' 4"  (1.626 m), weight 65.7 kg, SpO2 98 %.   Intake/Output Summary (Last 24 hours) at 05/08/2018 1429 Last data filed at 05/08/2018 1046 Gross per 24 hour  Intake 168 ml  Output 300 ml  Net -132 ml    Exam Awake Alert, Oriented x 3, No new F.N deficits, Normal affect Symmetrical Chest wall movement, Good air movement bilaterally, CTAB RRR,No Gallops,Rubs or new Murmurs, No Parasternal Heave +ve B.Sounds, Abd Soft, Non tender, No rebound -guarding or rigidity. No Cyanosis, Clubbing or edema, No new Rash or bruise  Data Review   CBC w Diff:  Lab Results  Component Value Date   WBC 3.9 (L) 05/08/2018   HGB 8.9 (L) 05/08/2018   HCT 27.0 (L) 05/08/2018   PLT 173 05/08/2018   LYMPHOPCT 33 05/06/2018   MONOPCT 10 05/06/2018   EOSPCT 1 05/06/2018   BASOPCT 0 05/06/2018    CMP:  Lab Results  Component Value Date   NA 138 05/08/2018   K 3.9 05/08/2018   CL 109 05/08/2018   CO2 20 (L) 05/08/2018   BUN 10 05/08/2018   CREATININE 1.15 (H) 05/08/2018   PROT 6.4 (L) 05/07/2018   ALBUMIN 2.9 (L) 05/06/2018   BILITOT 0.5 05/06/2018   ALKPHOS 125 05/06/2018   AST 47 (H) 05/06/2018   ALT 32 05/06/2018  .   Total Time in preparing paper work, data evaluation and todays exam - 58 minutes  Phillips Climes M.D on 05/08/2018 at 2:29 PM  [facility]   Office  [phone]

## 2018-05-11 ENCOUNTER — Encounter (HOSPITAL_COMMUNITY): Payer: Self-pay | Admitting: Internal Medicine

## 2018-05-11 ENCOUNTER — Inpatient Hospital Stay (HOSPITAL_BASED_OUTPATIENT_CLINIC_OR_DEPARTMENT_OTHER): Payer: Medicare HMO | Admitting: Internal Medicine

## 2018-05-11 ENCOUNTER — Inpatient Hospital Stay (HOSPITAL_COMMUNITY): Payer: Medicare HMO | Attending: Hematology

## 2018-05-11 VITALS — BP 126/50 | HR 58 | Temp 98.0°F | Resp 16

## 2018-05-11 DIAGNOSIS — R971 Elevated cancer antigen 125 [CA 125]: Secondary | ICD-10-CM | POA: Diagnosis not present

## 2018-05-11 DIAGNOSIS — R197 Diarrhea, unspecified: Secondary | ICD-10-CM | POA: Diagnosis not present

## 2018-05-11 DIAGNOSIS — I10 Essential (primary) hypertension: Secondary | ICD-10-CM | POA: Insufficient documentation

## 2018-05-11 DIAGNOSIS — C9211 Chronic myeloid leukemia, BCR/ABL-positive, in remission: Secondary | ICD-10-CM | POA: Diagnosis not present

## 2018-05-11 DIAGNOSIS — R1310 Dysphagia, unspecified: Secondary | ICD-10-CM | POA: Diagnosis not present

## 2018-05-11 DIAGNOSIS — Z9221 Personal history of antineoplastic chemotherapy: Secondary | ICD-10-CM | POA: Diagnosis not present

## 2018-05-11 LAB — CBC WITH DIFFERENTIAL/PLATELET
Abs Immature Granulocytes: 0.01 10*3/uL (ref 0.00–0.07)
Basophils Absolute: 0 10*3/uL (ref 0.0–0.1)
Basophils Relative: 0 %
Eosinophils Absolute: 0.1 10*3/uL (ref 0.0–0.5)
Eosinophils Relative: 1 %
HCT: 31.2 % — ABNORMAL LOW (ref 36.0–46.0)
Hemoglobin: 9.7 g/dL — ABNORMAL LOW (ref 12.0–15.0)
IMMATURE GRANULOCYTES: 0 %
Lymphocytes Relative: 43 %
Lymphs Abs: 2.4 10*3/uL (ref 0.7–4.0)
MCH: 28.3 pg (ref 26.0–34.0)
MCHC: 31.1 g/dL (ref 30.0–36.0)
MCV: 91 fL (ref 80.0–100.0)
MONOS PCT: 9 %
Monocytes Absolute: 0.5 10*3/uL (ref 0.1–1.0)
Neutro Abs: 2.6 10*3/uL (ref 1.7–7.7)
Neutrophils Relative %: 47 %
Platelets: 229 10*3/uL (ref 150–400)
RBC: 3.43 MIL/uL — ABNORMAL LOW (ref 3.87–5.11)
RDW: 15.6 % — ABNORMAL HIGH (ref 11.5–15.5)
WBC: 5.5 10*3/uL (ref 4.0–10.5)
nRBC: 0 % (ref 0.0–0.2)

## 2018-05-11 LAB — COMPREHENSIVE METABOLIC PANEL
ALT: 24 U/L (ref 0–44)
AST: 44 U/L — ABNORMAL HIGH (ref 15–41)
Albumin: 2.9 g/dL — ABNORMAL LOW (ref 3.5–5.0)
Alkaline Phosphatase: 137 U/L — ABNORMAL HIGH (ref 38–126)
Anion gap: 7 (ref 5–15)
BUN: 20 mg/dL (ref 8–23)
CO2: 23 mmol/L (ref 22–32)
Calcium: 8.6 mg/dL — ABNORMAL LOW (ref 8.9–10.3)
Chloride: 108 mmol/L (ref 98–111)
Creatinine, Ser: 1.18 mg/dL — ABNORMAL HIGH (ref 0.44–1.00)
GFR calc non Af Amer: 46 mL/min — ABNORMAL LOW (ref >60)
GFR, EST AFRICAN AMERICAN: 53 mL/min — AB (ref >60)
Glucose, Bld: 100 mg/dL — ABNORMAL HIGH (ref 70–99)
Potassium: 4.5 mmol/L (ref 3.5–5.1)
Sodium: 138 mmol/L (ref 135–145)
Total Bilirubin: 0.5 mg/dL (ref 0.3–1.2)
Total Protein: 6.2 g/dL — ABNORMAL LOW (ref 6.5–8.1)

## 2018-05-11 LAB — LACTATE DEHYDROGENASE: LDH: 362 U/L — ABNORMAL HIGH (ref 98–192)

## 2018-05-11 NOTE — Progress Notes (Signed)
Diagnosis Chronic myeloid leukemia, BCR/ABL-positive, in remission (April Page) - Plan: C difficile quick screen w PCR reflex, CBC with Differential, Comprehensive metabolic panel, Lactate dehydrogenase, BCR-ABL1, CML/ALL, PCR, QUANT  Diarrhea, unspecified type - Plan: C difficile quick screen w PCR reflex, CBC with Differential, Comprehensive metabolic panel, Lactate dehydrogenase, BCR-ABL1, CML/ALL, PCR, QUANT  Staging Cancer Staging No matching staging information was found for the patient.  Assessment and Plan:   1.  Chronic myelogenous leukemia (CML), BCR-ABL1-positive ([place]) 1.  CML in chronic phase: -Diagnosis in March 2016, was on Nilotinib 300 mg twice daily started in May 2016, discontinued in July 2018 secondary to recurrent strokes. - Dasatinib 100 mg daily started in February 2019. Pt reportedly tolerated therapy.    Labs done 05/11/2018 reviewed and showed WBC 5.5 HB 9.7 Plts 229,000.  Chemistries WNL with K+ 4.5 Cr 1.18.  BCR/ABL is pending.  Pt will be notified for results. Family member [name] will be contacted at [phone].   Pt will continue Dasatinib.    2.  Bradycardia.  Pt was noted to have Mobitz 2 AV block.  Pacemaker placed when pt was admitted 05/06/2018 to 05/08/2018.  HR today 58.  Pt should follow-up with cardiology as directed.  Usually Nilotinib has more CV side effects than Dasatinib.  Pt stopped Nilotinib in 2018.    3.  Diarrhea.  Will check stool for C Diff.  May be potential side effect of Dasatinib.  Recommend imodium if symptoms continue.  Last colonoscopy was in 08/2013 and showed diverticulosis.  PT referred to GI  4.  Trouble swallowing.  PT referred to GI for evaluation.    5  HTN.  BP is 126/50.  Follow-up with PCP for management.    25 minutes spent with more than 50% spent in review of records, counseling and coordination of care .   Current Status:  Pt is seen today for follow-up.  She is accompanied by family members.  She was recently hospitalized  for bradycardia and had pacemaker placed.  She reports diarrhea and trouble swallowing.      Chronic myelogenous leukemia (CML), BCR-ABL1-positive ([place])   06/01/2014 Tumor Marker    Results for SHONTA, BOURQUE (MRN [id number]) as of 12/02/2014 09:30  06/01/2014 09:59 BCR ABL1 / ABL1 IS: 71.220 (H)     07/14/2014 Bone Marrow Biopsy    Bone Marrow, Aspirate,Biopsy, and Clot, right iliac - HYPERCELLULAR BONE MARROW WITH CHRONIC MYELOGENOUS LEUKEMIA. - SEE COMMENT. PERIPHERAL BLOOD: - CHRONIC MYELOGENOUS LEUKEMIA. Diagnosis Note Previous PCR analysis performed for bcr-abl1 is report    07/21/2014 - 08/04/2014 Chemotherapy    Tasigna 300 mg BID    08/04/2014 Miscellaneous    Ran out of 2 week sample secondary to issues with insurance approval.    08/15/2014 -  Chemotherapy    Tasigna 300 mg BID    09/17/2014 Tumor Marker    b3a2 transcript % 4.122     12/02/2014 Tumor Marker    b3a2 transcript: 0.046     03/31/2015 Tumor Marker    b3a2 transcript:  <0.001 %       03/31/2015 Remission    Complete molecular response, no detectable BCR/ABL using PCR assay    05/24/2016 Remission    NEGATIVE for the BCR-ABL1 e1a2 (p190), e13a2 (b2a2, p210) and e14a2 (b3a2, p210) fusion transcripts.      Problem List Patient Active Problem List   Diagnosis Date Noted  . Bradycardia [R00.1] 05/07/2018  . Elevated troponin [R79.89] 05/07/2018  . Encopresis [R15.9]  05/07/2018  . Pleural effusion [J90] 05/07/2018  . AKI (acute kidney injury) ([place]) [N17.9] 05/07/2018  . Vaginal bleeding [N93.9] 11/04/2017  . Vaginal irritation from pessary [N89.9] 11/04/2017  . Controlled diabetes mellitus type 2 with complications ([place]) [D42.8] 08/26/2016  . Physical deconditioning [R53.81]   . Acute ischemic stroke ([place]) [I63.9] 08/20/2016  . Pressure injury of skin [L89.90] 08/17/2016  . Acute CVA (cerebrovascular accident) ([place]) [I63.9] 08/17/2016  . UTI (urinary tract infection) [N39.0] 08/16/2016  . Constipation [K59.00]  07/10/2016  . Dysphagia as late effect of cerebrovascular accident (CVA) [I69.391] 07/10/2016  . Loss of weight [R63.4] 07/10/2016  . Impaired glucose tolerance [R73.02] 06/05/2016  . Slurred speech [R47.81]   . Hypokalemia [E87.6] 04/13/2016  . Hyperglycemia [R73.9] 04/13/2016  . Essential hypertension [I10] 04/13/2016  . Hyperlipidemia [E78.5] 04/13/2016  . S/P right hip fracture [Z87.81] 04/12/2016  . Hip fracture ([place]) [S72.009A] 04/12/2016  . Fall [W19.XXXA]   . Itching in the vaginal area [N89.8] 10/05/2014  . Chronic myelogenous leukemia (CML), BCR-ABL1-positive ([place]) [C92.10]   . GERD (gastroesophageal reflux disease) [K21.9] 12/06/2013  . Leukocytosis [D72.829] 08/06/2013  . Atypical chest pain [R07.89] 08/06/2013  . Chronic diarrhea [K52.9] 08/05/2013  . Pessary maintenance [Z46.89] 07/01/2013    Past Medical History Past Medical History:  Diagnosis Date  . Arthritis    stiff knees  . Blood dyscrasia   . Cancer ([place])   . CVA (cerebral vascular accident) ([place])   . GERD (gastroesophageal reflux disease)   . H/O: CML (chronic myeloid leukemia)   . Hyperlipidemia   . Hyperlipidemia   . Hypertension   . Obesity   . Pessary maintenance 07/01/2013  . Stroke ([place])    x 3    Past Surgical History Past Surgical History:  Procedure Laterality Date  . ABDOMINAL HYSTERECTOMY    . CHOLECYSTECTOMY    . COLONOSCOPY  06/09/2002   JGO:TLXBWIOM hemorrhoids; otherwise normal rectum, colon   . COLONOSCOPY N/A 08/25/2013   BTD:HRCBULA diverticulosis. Single colonic polyp-removed  s/p segmental biopsy and stool sample. random colon bx negative. +benign leiomyoma.  . cystocele/rectocele repair  2009  . ESOPHAGOGASTRODUODENOSCOPY  06/09/2002   GTX:MIWOEH upper gastrointestinal tract s/p  54-French Maloney dilator  . FEMUR IM NAIL Right 04/12/2016   Procedure: INTRAMEDULLARY (IM) NAIL FEMORAL;  Surgeon: Nicholes Stairs, MD;  Location: [facility];  Service: Orthopedics;  Laterality:  Right;  . HERNIA REPAIR    . IR THORACENTESIS ASP PLEURAL SPACE W/IMG GUIDE  05/07/2018  . LOOP RECORDER INSERTION N/A 08/23/2016   Procedure: Loop Recorder Insertion;  Surgeon: Constance Haw, MD;  Location: [facility] CV [facility];  Service: Cardiovascular;  Laterality: N/A;  . LOOP RECORDER REMOVAL N/A 05/07/2018   Procedure: LOOP RECORDER REMOVAL;  Surgeon: Constance Haw, MD;  Location: [facility];  Service: Cardiovascular;  Laterality: N/A;  . OOPHORECTOMY    . PACEMAKER IMPLANT N/A 05/07/2018   Procedure: PACEMAKER IMPLANT;  Surgeon: Constance Haw, MD;  Location: [facility];  Service: Cardiovascular;  Laterality: N/A;  . TEE WITHOUT CARDIOVERSION N/A 08/23/2016   Procedure: TRANSESOPHAGEAL ECHOCARDIOGRAM (TEE);  Surgeon: Fay Records, MD;  [facility] ENDOSCOPY;  Service: Cardiovascular;  Laterality: N/A;    Family History Family History  Problem Relation Age of Onset  . Anuerysm Mother        deceased age 23, brain anuerysm  . Early death Mother 81  . Heart disease Father   . Hypertension Sister   .  Obesity Brother   . Hypertension Sister   . Arthritis Sister   . Other Son        cardiac arrest  . Heart disease Child 9       cardiac arrest  . Colon cancer Neg Hx      Social History  reports that she has never smoked. She has never used smokeless tobacco. She reports that she does not drink alcohol or use drugs.  Medications  Current Outpatient Medications:  .  acetaminophen (TYLENOL) 325 MG tablet, Take 2 tablets (650 mg total) by mouth every 6 (six) hours as needed for mild pain (or temp > 37.5 C (99.5 F))., Disp: , Rfl:  .  amLODipine (NORVASC) 10 MG tablet, Take 1 tablet (10 mg total) by mouth daily., Disp: 30 tablet, Rfl: 0 .  aspirin 325 MG tablet, Take 1 tablet (325 mg total) by mouth daily., Disp: 30 tablet, Rfl: 4 .  atorvastatin (LIPITOR) 40 MG tablet, Take 1 tablet (40 mg total) by mouth daily at 6 PM., Disp: 30 tablet, Rfl: 0 .   dasatinib (SPRYCEL) 100 MG tablet, Take 1 tablet (100 mg total) by mouth daily. (Patient taking differently: Take 100 mg by mouth every evening. ), Disp: 30 tablet, Rfl: 11 .  feeding supplement, ENSURE ENLIVE, (ENSURE ENLIVE) LIQD, Take 237 mLs by mouth 2 (two) times daily between meals., Disp: 237 mL, Rfl: 12 .  hydrALAZINE (APRESOLINE) 25 MG tablet, Take 1 tablet (25 mg total) by mouth 2 (two) times daily., Disp: 60 tablet, Rfl: 0 .  losartan (COZAAR) 100 MG tablet, Take 1 tablet (100 mg total) by mouth daily. Resume on 3/28 (Patient taking differently: Take 100 mg by mouth daily. ), Disp: 30 tablet, Rfl: 0 .  Multiple Vitamin (MULTIVITAMIN WITH MINERALS) TABS tablet, Take 1 tablet by mouth daily., Disp: , Rfl:  .  sennosides-docusate sodium (SENOKOT-S) 8.6-50 MG tablet, Take 1 tablet by mouth every evening., Disp: , Rfl:  .  polyethylene glycol (MIRALAX / GLYCOLAX) packet, Take 17 g by mouth daily. (Patient not taking: Reported on 05/11/2018), Disp: 14 each, Rfl: 0  Allergies Patient has no known allergies.  Review of Systems Review of Systems - Oncology ROS negative other than diarrhea and trouble swallowing   Physical Exam  Vitals Wt Readings from Last 3 Encounters:  05/08/18 144 lb 13.5 oz (65.7 kg)  12/03/17 148 lb 1.6 oz (67.2 kg)  11/12/17 155 lb (70.3 kg)   Temp Readings from Last 3 Encounters:  05/11/18 98 F (36.7 C) (Oral)  05/08/18 99.4 F (37.4 C) (Oral)  12/03/17 (!) 97.2 F (36.2 C) (Oral)   BP Readings from Last 3 Encounters:  05/11/18 (!) 126/50  05/08/18 (!) 149/72  12/30/17 (!) 159/82   Pulse Readings from Last 3 Encounters:  05/11/18 (!) 58  05/08/18 91  12/30/17 68   Constitutional: Well-developed, well-nourished, and in no distress.  Pt seated in wheelchair HENT: Head: Normocephalic and atraumatic.  Mouth/Throat: No oropharyngeal exudate. Mucosa moist. Eyes: Pupils are equal, round, and reactive to light. Conjunctivae are normal. No scleral icterus.   Neck: Normal range of motion. Neck supple. No JVD present.  Cardiovascular: Normal rate, regular rhythm and normal heart sounds.  Exam reveals no gallop and no friction rub.  Left sided pacemaker noted.   No murmur heard. Pulmonary/Chest: Effort normal and breath sounds normal. No respiratory distress. No wheezes.No rales.  Abdominal: Soft. Bowel sounds are normal. No distension. There is no tenderness. There is no  guarding.  Musculoskeletal: No edema or tenderness.  Lymphadenopathy: No cervical, axillary or supraclavicular adenopathy.  Neurological: Alert and oriented to person, place, and time. No cranial nerve deficit.  Skin: Skin is warm and dry. No rash noted. No erythema. No pallor.  Psychiatric: Affect and judgment normal.   Labs Appointment on 05/11/2018  Component Date Value Ref Range Status  . LDH 05/11/2018 362* 98 - 192 U/L Final   Performed at [facility], [address], [place], [place] [address]  . Sodium 05/11/2018 138  135 - 145 mmol/L Final  . Potassium 05/11/2018 4.5  3.5 - 5.1 mmol/L Final  . Chloride 05/11/2018 108  98 - 111 mmol/L Final  . CO2 05/11/2018 23  22 - 32 mmol/L Final  . Glucose, Bld 05/11/2018 100* 70 - 99 mg/dL Final  . BUN 05/11/2018 20  8 - 23 mg/dL Final  . Creatinine, Ser 05/11/2018 1.18* 0.44 - 1.00 mg/dL Final  . Calcium 05/11/2018 8.6* 8.9 - 10.3 mg/dL Final  . Total Protein 05/11/2018 6.2* 6.5 - 8.1 g/dL Final  . Albumin 05/11/2018 2.9* 3.5 - 5.0 g/dL Final  . AST 05/11/2018 44* 15 - 41 U/L Final  . ALT 05/11/2018 24  0 - 44 U/L Final  . Alkaline Phosphatase 05/11/2018 137* 38 - 126 U/L Final  . Total Bilirubin 05/11/2018 0.5  0.3 - 1.2 mg/dL Final  . GFR calc non Af Amer 05/11/2018 46* >60 mL/min Final  . GFR calc Af Amer 05/11/2018 53* >60 mL/min Final  . Anion gap 05/11/2018 7  5 - 15 Final   Performed at [facility], [address], [place], [place] [address]  . WBC 05/11/2018 5.5  4.0 - 10.5 K/uL Final  . RBC 05/11/2018 3.43* 3.87  - 5.11 MIL/uL Final  . Hemoglobin 05/11/2018 9.7* 12.0 - 15.0 g/dL Final  . HCT 05/11/2018 31.2* 36.0 - 46.0 % Final  . MCV 05/11/2018 91.0  80.0 - 100.0 fL Final  . MCH 05/11/2018 28.3  26.0 - 34.0 pg Final  . MCHC 05/11/2018 31.1  30.0 - 36.0 g/dL Final  . RDW 05/11/2018 15.6* 11.5 - 15.5 % Final  . Platelets 05/11/2018 229  150 - 400 K/uL Final  . nRBC 05/11/2018 0.0  0.0 - 0.2 % Final  . Neutrophils Relative % 05/11/2018 47  % Final  . Neutro Abs 05/11/2018 2.6  1.7 - 7.7 K/uL Final  . Lymphocytes Relative 05/11/2018 43  % Final  . Lymphs Abs 05/11/2018 2.4  0.7 - 4.0 K/uL Final  . Monocytes Relative 05/11/2018 9  % Final  . Monocytes Absolute 05/11/2018 0.5  0.1 - 1.0 K/uL Final  . Eosinophils Relative 05/11/2018 1  % Final  . Eosinophils Absolute 05/11/2018 0.1  0.0 - 0.5 K/uL Final  . Basophils Relative 05/11/2018 0  % Final  . Basophils Absolute 05/11/2018 0.0  0.0 - 0.1 K/uL Final  . Immature Granulocytes 05/11/2018 0  % Final  . Abs Immature Granulocytes 05/11/2018 0.01  0.00 - 0.07 K/uL Final   Performed at [facility], [address], [place], [place] [address]     Pathology Orders Placed This Encounter  Procedures  . C difficile quick screen w PCR reflex    Standing Status:   Future    Standing Expiration Date:   05/11/2019  . CBC with Differential    Standing Status:   Future    Standing Expiration Date:   05/11/2019  . Comprehensive metabolic panel    Standing  Status:   Future    Standing Expiration Date:   05/11/2019  . Lactate dehydrogenase    Standing Status:   Future    Standing Expiration Date:   05/11/2019  . BCR-ABL1, CML/ALL, PCR, QUANT    Standing Status:   Future    Standing Expiration Date:   05/11/2019       Zoila Shutter MD

## 2018-05-12 ENCOUNTER — Encounter: Payer: Self-pay | Admitting: Internal Medicine

## 2018-05-12 DIAGNOSIS — L89151 Pressure ulcer of sacral region, stage 1: Secondary | ICD-10-CM | POA: Diagnosis not present

## 2018-05-12 DIAGNOSIS — E1122 Type 2 diabetes mellitus with diabetic chronic kidney disease: Secondary | ICD-10-CM | POA: Diagnosis not present

## 2018-05-12 DIAGNOSIS — R1310 Dysphagia, unspecified: Secondary | ICD-10-CM | POA: Diagnosis not present

## 2018-05-12 DIAGNOSIS — I69328 Other speech and language deficits following cerebral infarction: Secondary | ICD-10-CM | POA: Diagnosis not present

## 2018-05-12 DIAGNOSIS — N183 Chronic kidney disease, stage 3 (moderate): Secondary | ICD-10-CM | POA: Diagnosis not present

## 2018-05-12 DIAGNOSIS — Z48812 Encounter for surgical aftercare following surgery on the circulatory system: Secondary | ICD-10-CM | POA: Diagnosis not present

## 2018-05-12 DIAGNOSIS — I129 Hypertensive chronic kidney disease with stage 1 through stage 4 chronic kidney disease, or unspecified chronic kidney disease: Secondary | ICD-10-CM | POA: Diagnosis not present

## 2018-05-12 DIAGNOSIS — I69391 Dysphagia following cerebral infarction: Secondary | ICD-10-CM | POA: Diagnosis not present

## 2018-05-12 DIAGNOSIS — C9210 Chronic myeloid leukemia, BCR/ABL-positive, not having achieved remission: Secondary | ICD-10-CM | POA: Diagnosis not present

## 2018-05-12 LAB — CULTURE, BODY FLUID W GRAM STAIN -BOTTLE: Culture: NO GROWTH

## 2018-05-13 ENCOUNTER — Other Ambulatory Visit (HOSPITAL_COMMUNITY): Payer: Self-pay | Admitting: *Deleted

## 2018-05-13 DIAGNOSIS — I129 Hypertensive chronic kidney disease with stage 1 through stage 4 chronic kidney disease, or unspecified chronic kidney disease: Secondary | ICD-10-CM | POA: Diagnosis not present

## 2018-05-13 DIAGNOSIS — Z48812 Encounter for surgical aftercare following surgery on the circulatory system: Secondary | ICD-10-CM | POA: Diagnosis not present

## 2018-05-13 DIAGNOSIS — N183 Chronic kidney disease, stage 3 (moderate): Secondary | ICD-10-CM | POA: Diagnosis not present

## 2018-05-13 DIAGNOSIS — I69328 Other speech and language deficits following cerebral infarction: Secondary | ICD-10-CM | POA: Diagnosis not present

## 2018-05-13 DIAGNOSIS — I69391 Dysphagia following cerebral infarction: Secondary | ICD-10-CM | POA: Diagnosis not present

## 2018-05-13 DIAGNOSIS — C9210 Chronic myeloid leukemia, BCR/ABL-positive, not having achieved remission: Secondary | ICD-10-CM

## 2018-05-13 DIAGNOSIS — E1122 Type 2 diabetes mellitus with diabetic chronic kidney disease: Secondary | ICD-10-CM | POA: Diagnosis not present

## 2018-05-13 DIAGNOSIS — L89151 Pressure ulcer of sacral region, stage 1: Secondary | ICD-10-CM | POA: Diagnosis not present

## 2018-05-13 DIAGNOSIS — R1310 Dysphagia, unspecified: Secondary | ICD-10-CM | POA: Diagnosis not present

## 2018-05-13 MED ORDER — DASATINIB 100 MG PO TABS: 100.0000 mg | ORAL_TABLET | Freq: Every day | ORAL | 11 refills | Status: DC

## 2018-05-14 ENCOUNTER — Encounter: Payer: Self-pay | Admitting: Podiatry

## 2018-05-14 ENCOUNTER — Encounter: Payer: Self-pay | Admitting: Pharmacist

## 2018-05-14 ENCOUNTER — Telehealth (HOSPITAL_COMMUNITY): Payer: Self-pay | Admitting: Pharmacist

## 2018-05-14 ENCOUNTER — Ambulatory Visit: Payer: Medicare HMO | Admitting: Podiatry

## 2018-05-14 DIAGNOSIS — Z48812 Encounter for surgical aftercare following surgery on the circulatory system: Secondary | ICD-10-CM | POA: Diagnosis not present

## 2018-05-14 DIAGNOSIS — N183 Chronic kidney disease, stage 3 (moderate): Secondary | ICD-10-CM | POA: Diagnosis not present

## 2018-05-14 DIAGNOSIS — E1122 Type 2 diabetes mellitus with diabetic chronic kidney disease: Secondary | ICD-10-CM | POA: Diagnosis not present

## 2018-05-14 DIAGNOSIS — Q828 Other specified congenital malformations of skin: Secondary | ICD-10-CM

## 2018-05-14 DIAGNOSIS — M79674 Pain in right toe(s): Secondary | ICD-10-CM | POA: Diagnosis not present

## 2018-05-14 DIAGNOSIS — I69391 Dysphagia following cerebral infarction: Secondary | ICD-10-CM | POA: Diagnosis not present

## 2018-05-14 DIAGNOSIS — M79675 Pain in left toe(s): Secondary | ICD-10-CM

## 2018-05-14 DIAGNOSIS — B351 Tinea unguium: Secondary | ICD-10-CM

## 2018-05-14 DIAGNOSIS — R1310 Dysphagia, unspecified: Secondary | ICD-10-CM | POA: Diagnosis not present

## 2018-05-14 DIAGNOSIS — C9210 Chronic myeloid leukemia, BCR/ABL-positive, not having achieved remission: Secondary | ICD-10-CM | POA: Diagnosis not present

## 2018-05-14 DIAGNOSIS — I69328 Other speech and language deficits following cerebral infarction: Secondary | ICD-10-CM | POA: Diagnosis not present

## 2018-05-14 DIAGNOSIS — L89151 Pressure ulcer of sacral region, stage 1: Secondary | ICD-10-CM | POA: Diagnosis not present

## 2018-05-14 DIAGNOSIS — I129 Hypertensive chronic kidney disease with stage 1 through stage 4 chronic kidney disease, or unspecified chronic kidney disease: Secondary | ICD-10-CM | POA: Diagnosis not present

## 2018-05-14 LAB — BCR-ABL1, CML/ALL, PCR, QUANT

## 2018-05-14 NOTE — Progress Notes (Signed)
Subjective:    Patient ID: April Page, female    DOB: 23-Jun-1945, 73 y.o.   MRN: [id number]  HPI 73 year old female presents the office today for concerns of toenail fungus.  Is been getting worse the last 6 months that she said her nails are thick and discolored.  She has had no recent treatment for this.  She also has a callus on the ball of her right foot.  She states that it hurts with walking.  Denies any redness or drainage or any swelling to the area.  She has no other concerns today.   Review of Systems  All other systems reviewed and are negative.  Past Medical History:  Diagnosis Date  . Arthritis    stiff knees  . Blood dyscrasia   . Cancer ([place])   . CVA (cerebral vascular accident) ([place])   . GERD (gastroesophageal reflux disease)   . H/O: CML (chronic myeloid leukemia)   . Hyperlipidemia   . Hyperlipidemia   . Hypertension   . Obesity   . Pessary maintenance 07/01/2013  . Stroke ([place])    x 3    Past Surgical History:  Procedure Laterality Date  . ABDOMINAL HYSTERECTOMY    . CHOLECYSTECTOMY    . COLONOSCOPY  06/09/2002   OEV:[id number] hemorrhoids; otherwise normal rectum, colon   . COLONOSCOPY N/A 08/25/2013   HWE:XHBZJIR diverticulosis. Single colonic polyp-removed  s/p segmental biopsy and stool sample. random colon bx negative. +benign leiomyoma.  . cystocele/rectocele repair  2009  . ESOPHAGOGASTRODUODENOSCOPY  06/09/2002   CVE:LFYBOF upper gastrointestinal tract s/p  54-French Maloney dilator  . FEMUR IM NAIL Right 04/12/2016   Procedure: INTRAMEDULLARY (IM) NAIL FEMORAL;  Surgeon: Nicholes Stairs, MD;  Location: [facility];  Service: Orthopedics;  Laterality: Right;  . HERNIA REPAIR    . IR THORACENTESIS ASP PLEURAL SPACE W/IMG GUIDE  05/07/2018  . LOOP RECORDER INSERTION N/A 08/23/2016   Procedure: Loop Recorder Insertion;  Surgeon: Constance Haw, MD;  Location: [place] CV LAB;  Service: Cardiovascular;  Laterality: N/A;  . LOOP RECORDER  REMOVAL N/A 05/07/2018   Procedure: LOOP RECORDER REMOVAL;  Surgeon: Constance Haw, MD;  Location: [place] CV LAB;  Service: Cardiovascular;  Laterality: N/A;  . OOPHORECTOMY    . PACEMAKER IMPLANT N/A 05/07/2018   Procedure: PACEMAKER IMPLANT;  Surgeon: Constance Haw, MD;  Location: [place] CV LAB;  Service: Cardiovascular;  Laterality: N/A;  . TEE WITHOUT CARDIOVERSION N/A 08/23/2016   Procedure: TRANSESOPHAGEAL ECHOCARDIOGRAM (TEE);  Surgeon: Fay Records, MD;  Location: [facility] ENDOSCOPY;  Service: Cardiovascular;  Laterality: N/A;     Current Outpatient Medications:  .  acetaminophen (TYLENOL) 325 MG tablet, Take 2 tablets (650 mg total) by mouth every 6 (six) hours as needed for mild pain (or temp > 37.5 C (99.5 F))., Disp: , Rfl:  .  amLODipine (NORVASC) 10 MG tablet, Take 1 tablet (10 mg total) by mouth daily., Disp: 30 tablet, Rfl: 0 .  aspirin 325 MG tablet, Take 1 tablet (325 mg total) by mouth daily., Disp: 30 tablet, Rfl: 4 .  atorvastatin (LIPITOR) 40 MG tablet, Take 1 tablet (40 mg total) by mouth daily at 6 PM., Disp: 30 tablet, Rfl: 0 .  dasatinib (SPRYCEL) 100 MG tablet, Take 1 tablet (100 mg total) by mouth daily., Disp: 30 tablet, Rfl: 11 .  feeding supplement, ENSURE ENLIVE, (ENSURE ENLIVE) LIQD, Take 237 mLs by mouth 2 (two) times daily between meals., Disp: 237 mL,  Rfl: 12 .  hydrALAZINE (APRESOLINE) 25 MG tablet, Take 1 tablet (25 mg total) by mouth 2 (two) times daily., Disp: 60 tablet, Rfl: 0 .  losartan (COZAAR) 100 MG tablet, Take 1 tablet (100 mg total) by mouth daily. Resume on 3/28 (Patient taking differently: Take 100 mg by mouth daily. ), Disp: 30 tablet, Rfl: 0 .  Multiple Vitamin (MULTIVITAMIN WITH MINERALS) TABS tablet, Take 1 tablet by mouth daily., Disp: , Rfl:  .  polyethylene glycol (MIRALAX / GLYCOLAX) packet, Take 17 g by mouth daily. (Patient not taking: Reported on 05/11/2018), Disp: 14 each, Rfl: 0 .  sennosides-docusate sodium  (SENOKOT-S) 8.6-50 MG tablet, Take 1 tablet by mouth every evening., Disp: , Rfl:   No Known Allergies       Objective:   Physical Exam  General: AAO x3, NAD  Dermatological: Nails are hypertrophic, dystrophic, brittle, discolored, elongated 10. No surrounding redness or drainage. Tenderness nails 1-5 bilaterally.  Hyperkeratotic lesion present right foot submetatarsal area.  Upon debridement there is no underlying ulceration, drainage or any signs of infection.  No open lesions or pre-ulcerative lesions are identified today.   Vascular: Dorsalis Pedis artery and Posterior Tibial artery pedal pulses are palpable bilateral with immedate capillary fill time. There is no pain with calf compression, swelling, warmth, erythema.   Neruologic: Grossly intact via light touch bilateral. .   Musculoskeletal: No gross boney pedal deformities bilateral. No pain, crepitus, or limitation noted with foot and ankle range of motion bilateral.      Assessment & Plan:  73 year old female symptomatic onychomycosis, hyperkeratotic lesion -Treatment options discussed including all alternatives, risks, and complications -Etiology of symptoms were discussed -Nails debrided x10 without any complications or bleeding. -Debrided the hyperkeratotic lesion without any complications or bleeding. -Discussed importance of daily foot inspection  Return in about 3 months (around 08/14/2018).  Trula Slade DPM

## 2018-05-14 NOTE — Telephone Encounter (Signed)
Erroneous encounter

## 2018-05-14 NOTE — Telephone Encounter (Addendum)
Oral Oncology Pharmacist Encounter  Received refill prescription for Sprycel (dasatinib) for the treatment of CML, planned duration until disease progression or unacceptable drug toxicity. Ms. Hagan was previously filling at another pharmacy and is switching to out pharmacy.  CBC from 05/11/2018 assessed, no relevant lab abnormalities. Prescription dose and frequency assessed.   Current medication list in Epic reviewed, a few DDIs with dasatinib identified: - Dasatinib may enhance the anticoagulant effect of agents with antiplatelet properties, like aspirin. Monitor CBC and monitor for s/sx of bleeding.  - Acetaminophen may enhance the hepatotoxic effect of dasatinib. Monitor LFTs. Acetaminophen for prn use. Dose baseline dose adjustment needed.  Prescription has been e-scribed to the [facility] for benefits analysis and approval.  Oral Oncology Clinic will continue to follow for insurance authorization, copayment issues, initial counseling and start date.  Darl Pikes, PharmD, BCPS, [facility] Hematology/Oncology Clinical Pharmacist [facility]/AP Oral [place] [facility] [phone]  05/14/2018 3:46 PM

## 2018-05-15 ENCOUNTER — Telehealth (HOSPITAL_COMMUNITY): Payer: Self-pay | Admitting: Pharmacy Technician

## 2018-05-15 NOTE — Telephone Encounter (Signed)
Oral Oncology Patient Advocate Encounter  Received notification from [facility] that prior authorization for Sprycel is required.  PA submitted on CoverMyMeds Key [id number]  Status is pending  Oral Oncology Clinic will continue to follow.  [place] Patient [place] Phone [phone] Fax [phone] 05/15/2018 10:23 AM

## 2018-05-15 NOTE — Telephone Encounter (Signed)
Oral Oncology Patient Advocate Encounter  Submitted co-pay assistance application to [employer] online on 05/15/2018. Will check status of application this afternoon to see if patient was approved.  [place] Patient [place] Phone [phone] Fax [phone] 05/15/2018 11:13 AM

## 2018-05-15 NOTE — Telephone Encounter (Signed)
Oral Oncology Patient Advocate Encounter  Prior Authorization for Sprycel has been approved.    PA# [id number] Effective dates: 05/15/2018 through 03/11/2019  Patients co-pay is $2926.96.  Oral Oncology Clinic will continue to follow.   [place] Patient [place] Phone [phone] Fax [phone] 05/15/2018 10:50 AM

## 2018-05-15 NOTE — Telephone Encounter (Signed)
Called [employer] and conferenced in [place] on the call.  Debroah Baller will submit her mothers bank statement to [employer] for review.  She will call on Monday to make sure they received the income information and see if they have made a decision.  She will call me after she speaks with them on Monday.

## 2018-05-18 DIAGNOSIS — N183 Chronic kidney disease, stage 3 (moderate): Secondary | ICD-10-CM | POA: Diagnosis not present

## 2018-05-18 DIAGNOSIS — I69328 Other speech and language deficits following cerebral infarction: Secondary | ICD-10-CM | POA: Diagnosis not present

## 2018-05-18 DIAGNOSIS — I69391 Dysphagia following cerebral infarction: Secondary | ICD-10-CM | POA: Diagnosis not present

## 2018-05-18 DIAGNOSIS — Z48812 Encounter for surgical aftercare following surgery on the circulatory system: Secondary | ICD-10-CM | POA: Diagnosis not present

## 2018-05-18 DIAGNOSIS — C9210 Chronic myeloid leukemia, BCR/ABL-positive, not having achieved remission: Secondary | ICD-10-CM | POA: Diagnosis not present

## 2018-05-18 DIAGNOSIS — I129 Hypertensive chronic kidney disease with stage 1 through stage 4 chronic kidney disease, or unspecified chronic kidney disease: Secondary | ICD-10-CM | POA: Diagnosis not present

## 2018-05-18 DIAGNOSIS — E1122 Type 2 diabetes mellitus with diabetic chronic kidney disease: Secondary | ICD-10-CM | POA: Diagnosis not present

## 2018-05-18 DIAGNOSIS — L89151 Pressure ulcer of sacral region, stage 1: Secondary | ICD-10-CM | POA: Diagnosis not present

## 2018-05-18 DIAGNOSIS — R1310 Dysphagia, unspecified: Secondary | ICD-10-CM | POA: Diagnosis not present

## 2018-05-19 ENCOUNTER — Ambulatory Visit (INDEPENDENT_AMBULATORY_CARE_PROVIDER_SITE_OTHER): Payer: Medicare HMO | Admitting: Nurse Practitioner

## 2018-05-19 DIAGNOSIS — Z1389 Encounter for screening for other disorder: Secondary | ICD-10-CM | POA: Diagnosis not present

## 2018-05-19 DIAGNOSIS — R001 Bradycardia, unspecified: Secondary | ICD-10-CM | POA: Diagnosis not present

## 2018-05-19 DIAGNOSIS — K219 Gastro-esophageal reflux disease without esophagitis: Secondary | ICD-10-CM | POA: Diagnosis not present

## 2018-05-19 DIAGNOSIS — Z6823 Body mass index (BMI) 23.0-23.9, adult: Secondary | ICD-10-CM | POA: Diagnosis not present

## 2018-05-19 DIAGNOSIS — I441 Atrioventricular block, second degree: Secondary | ICD-10-CM | POA: Diagnosis not present

## 2018-05-19 LAB — CUP PACEART INCLINIC DEVICE CHECK
Date Time Interrogation Session: 20200310103907
Implantable Lead Implant Date: 20200227
Implantable Lead Implant Date: 20200227
Implantable Lead Location: 753859
Implantable Lead Location: 753860
Implantable Lead Model: 5076
Implantable Pulse Generator Implant Date: 20200227

## 2018-05-19 MED FILL — SPRYCEL 100 MG TABLET: 100 | 30 days supply | Qty: 30 | Fill #0

## 2018-05-19 NOTE — Telephone Encounter (Addendum)
Debroah Baller, patients daughter, called [employer] to check the status of application.  Patient has been approved.  Amount of grant and eligibility dates are not known at this time.  Billing information is below and has been shared with [facility].  BIN: [id number] PCN: [id number] ID: [id number] GP: [id number]  I have scheduled medication to be delivered to the patient on 05/20/2018.  [place] Patient [place] Phone [phone] Fax [phone] 05/19/2018 2:59 PM

## 2018-05-19 NOTE — Progress Notes (Signed)

## 2018-05-19 NOTE — Telephone Encounter (Signed)
Oral Chemotherapy Pharmacist Encounter    Spoke with patient's daughter to review dosing, administration, and side effects for Sprycel (dasatinib). Patient reported no new side effects or concerns since they were last seen in the office.   Darl Pikes, PharmD, BCPS, [facility] Hematology/Oncology Clinical Pharmacist [facility] Oral [place] [facility] [phone]  05/19/2018 3:10 PM

## 2018-05-20 DIAGNOSIS — I69391 Dysphagia following cerebral infarction: Secondary | ICD-10-CM | POA: Diagnosis not present

## 2018-05-20 DIAGNOSIS — R1310 Dysphagia, unspecified: Secondary | ICD-10-CM | POA: Diagnosis not present

## 2018-05-20 DIAGNOSIS — I129 Hypertensive chronic kidney disease with stage 1 through stage 4 chronic kidney disease, or unspecified chronic kidney disease: Secondary | ICD-10-CM | POA: Diagnosis not present

## 2018-05-20 DIAGNOSIS — E1122 Type 2 diabetes mellitus with diabetic chronic kidney disease: Secondary | ICD-10-CM | POA: Diagnosis not present

## 2018-05-20 DIAGNOSIS — Z1389 Encounter for screening for other disorder: Secondary | ICD-10-CM | POA: Diagnosis not present

## 2018-05-20 DIAGNOSIS — K219 Gastro-esophageal reflux disease without esophagitis: Secondary | ICD-10-CM | POA: Diagnosis not present

## 2018-05-20 DIAGNOSIS — R001 Bradycardia, unspecified: Secondary | ICD-10-CM | POA: Diagnosis not present

## 2018-05-20 DIAGNOSIS — L89151 Pressure ulcer of sacral region, stage 1: Secondary | ICD-10-CM | POA: Diagnosis not present

## 2018-05-20 DIAGNOSIS — Z6823 Body mass index (BMI) 23.0-23.9, adult: Secondary | ICD-10-CM | POA: Diagnosis not present

## 2018-05-20 DIAGNOSIS — I441 Atrioventricular block, second degree: Secondary | ICD-10-CM | POA: Diagnosis not present

## 2018-05-20 DIAGNOSIS — N183 Chronic kidney disease, stage 3 (moderate): Secondary | ICD-10-CM | POA: Diagnosis not present

## 2018-05-20 DIAGNOSIS — C9210 Chronic myeloid leukemia, BCR/ABL-positive, not having achieved remission: Secondary | ICD-10-CM | POA: Diagnosis not present

## 2018-05-20 DIAGNOSIS — I69328 Other speech and language deficits following cerebral infarction: Secondary | ICD-10-CM | POA: Diagnosis not present

## 2018-05-20 DIAGNOSIS — Z48812 Encounter for surgical aftercare following surgery on the circulatory system: Secondary | ICD-10-CM | POA: Diagnosis not present

## 2018-05-21 DIAGNOSIS — Z48812 Encounter for surgical aftercare following surgery on the circulatory system: Secondary | ICD-10-CM | POA: Diagnosis not present

## 2018-05-21 DIAGNOSIS — C9210 Chronic myeloid leukemia, BCR/ABL-positive, not having achieved remission: Secondary | ICD-10-CM | POA: Diagnosis not present

## 2018-05-21 DIAGNOSIS — I69328 Other speech and language deficits following cerebral infarction: Secondary | ICD-10-CM | POA: Diagnosis not present

## 2018-05-21 DIAGNOSIS — L89151 Pressure ulcer of sacral region, stage 1: Secondary | ICD-10-CM | POA: Diagnosis not present

## 2018-05-21 DIAGNOSIS — I129 Hypertensive chronic kidney disease with stage 1 through stage 4 chronic kidney disease, or unspecified chronic kidney disease: Secondary | ICD-10-CM | POA: Diagnosis not present

## 2018-05-21 DIAGNOSIS — E1122 Type 2 diabetes mellitus with diabetic chronic kidney disease: Secondary | ICD-10-CM | POA: Diagnosis not present

## 2018-05-21 DIAGNOSIS — N183 Chronic kidney disease, stage 3 (moderate): Secondary | ICD-10-CM | POA: Diagnosis not present

## 2018-05-21 DIAGNOSIS — I69391 Dysphagia following cerebral infarction: Secondary | ICD-10-CM | POA: Diagnosis not present

## 2018-05-21 DIAGNOSIS — R1310 Dysphagia, unspecified: Secondary | ICD-10-CM | POA: Diagnosis not present

## 2018-05-26 DIAGNOSIS — I69391 Dysphagia following cerebral infarction: Secondary | ICD-10-CM | POA: Diagnosis not present

## 2018-05-26 DIAGNOSIS — L89151 Pressure ulcer of sacral region, stage 1: Secondary | ICD-10-CM | POA: Diagnosis not present

## 2018-05-26 DIAGNOSIS — Z48812 Encounter for surgical aftercare following surgery on the circulatory system: Secondary | ICD-10-CM | POA: Diagnosis not present

## 2018-05-26 DIAGNOSIS — I129 Hypertensive chronic kidney disease with stage 1 through stage 4 chronic kidney disease, or unspecified chronic kidney disease: Secondary | ICD-10-CM | POA: Diagnosis not present

## 2018-05-26 DIAGNOSIS — N183 Chronic kidney disease, stage 3 (moderate): Secondary | ICD-10-CM | POA: Diagnosis not present

## 2018-05-26 DIAGNOSIS — R1310 Dysphagia, unspecified: Secondary | ICD-10-CM | POA: Diagnosis not present

## 2018-05-26 DIAGNOSIS — I69328 Other speech and language deficits following cerebral infarction: Secondary | ICD-10-CM | POA: Diagnosis not present

## 2018-05-26 DIAGNOSIS — E1122 Type 2 diabetes mellitus with diabetic chronic kidney disease: Secondary | ICD-10-CM | POA: Diagnosis not present

## 2018-05-26 DIAGNOSIS — C9210 Chronic myeloid leukemia, BCR/ABL-positive, not having achieved remission: Secondary | ICD-10-CM | POA: Diagnosis not present

## 2018-05-28 DIAGNOSIS — I129 Hypertensive chronic kidney disease with stage 1 through stage 4 chronic kidney disease, or unspecified chronic kidney disease: Secondary | ICD-10-CM | POA: Diagnosis not present

## 2018-05-28 DIAGNOSIS — N183 Chronic kidney disease, stage 3 (moderate): Secondary | ICD-10-CM | POA: Diagnosis not present

## 2018-05-28 DIAGNOSIS — I69328 Other speech and language deficits following cerebral infarction: Secondary | ICD-10-CM | POA: Diagnosis not present

## 2018-05-28 DIAGNOSIS — R1310 Dysphagia, unspecified: Secondary | ICD-10-CM | POA: Diagnosis not present

## 2018-05-28 DIAGNOSIS — L89151 Pressure ulcer of sacral region, stage 1: Secondary | ICD-10-CM | POA: Diagnosis not present

## 2018-05-28 DIAGNOSIS — E1122 Type 2 diabetes mellitus with diabetic chronic kidney disease: Secondary | ICD-10-CM | POA: Diagnosis not present

## 2018-05-28 DIAGNOSIS — I69391 Dysphagia following cerebral infarction: Secondary | ICD-10-CM | POA: Diagnosis not present

## 2018-05-28 DIAGNOSIS — Z48812 Encounter for surgical aftercare following surgery on the circulatory system: Secondary | ICD-10-CM | POA: Diagnosis not present

## 2018-05-28 DIAGNOSIS — C9210 Chronic myeloid leukemia, BCR/ABL-positive, not having achieved remission: Secondary | ICD-10-CM | POA: Diagnosis not present

## 2018-05-29 DIAGNOSIS — I69391 Dysphagia following cerebral infarction: Secondary | ICD-10-CM | POA: Diagnosis not present

## 2018-05-29 DIAGNOSIS — Z48812 Encounter for surgical aftercare following surgery on the circulatory system: Secondary | ICD-10-CM | POA: Diagnosis not present

## 2018-05-29 DIAGNOSIS — L89151 Pressure ulcer of sacral region, stage 1: Secondary | ICD-10-CM | POA: Diagnosis not present

## 2018-05-29 DIAGNOSIS — R1310 Dysphagia, unspecified: Secondary | ICD-10-CM | POA: Diagnosis not present

## 2018-05-29 DIAGNOSIS — C9210 Chronic myeloid leukemia, BCR/ABL-positive, not having achieved remission: Secondary | ICD-10-CM | POA: Diagnosis not present

## 2018-05-29 DIAGNOSIS — E1122 Type 2 diabetes mellitus with diabetic chronic kidney disease: Secondary | ICD-10-CM | POA: Diagnosis not present

## 2018-05-29 DIAGNOSIS — I69328 Other speech and language deficits following cerebral infarction: Secondary | ICD-10-CM | POA: Diagnosis not present

## 2018-05-29 DIAGNOSIS — N183 Chronic kidney disease, stage 3 (moderate): Secondary | ICD-10-CM | POA: Diagnosis not present

## 2018-05-29 DIAGNOSIS — I129 Hypertensive chronic kidney disease with stage 1 through stage 4 chronic kidney disease, or unspecified chronic kidney disease: Secondary | ICD-10-CM | POA: Diagnosis not present

## 2018-06-02 DIAGNOSIS — C9210 Chronic myeloid leukemia, BCR/ABL-positive, not having achieved remission: Secondary | ICD-10-CM | POA: Diagnosis not present

## 2018-06-02 DIAGNOSIS — I129 Hypertensive chronic kidney disease with stage 1 through stage 4 chronic kidney disease, or unspecified chronic kidney disease: Secondary | ICD-10-CM | POA: Diagnosis not present

## 2018-06-02 DIAGNOSIS — Z48812 Encounter for surgical aftercare following surgery on the circulatory system: Secondary | ICD-10-CM | POA: Diagnosis not present

## 2018-06-02 DIAGNOSIS — N183 Chronic kidney disease, stage 3 (moderate): Secondary | ICD-10-CM | POA: Diagnosis not present

## 2018-06-02 DIAGNOSIS — R1310 Dysphagia, unspecified: Secondary | ICD-10-CM | POA: Diagnosis not present

## 2018-06-02 DIAGNOSIS — L89151 Pressure ulcer of sacral region, stage 1: Secondary | ICD-10-CM | POA: Diagnosis not present

## 2018-06-02 DIAGNOSIS — I69328 Other speech and language deficits following cerebral infarction: Secondary | ICD-10-CM | POA: Diagnosis not present

## 2018-06-02 DIAGNOSIS — I69391 Dysphagia following cerebral infarction: Secondary | ICD-10-CM | POA: Diagnosis not present

## 2018-06-02 DIAGNOSIS — E1122 Type 2 diabetes mellitus with diabetic chronic kidney disease: Secondary | ICD-10-CM | POA: Diagnosis not present

## 2018-06-03 DIAGNOSIS — C9210 Chronic myeloid leukemia, BCR/ABL-positive, not having achieved remission: Secondary | ICD-10-CM | POA: Diagnosis not present

## 2018-06-03 DIAGNOSIS — R1310 Dysphagia, unspecified: Secondary | ICD-10-CM | POA: Diagnosis not present

## 2018-06-03 DIAGNOSIS — I69391 Dysphagia following cerebral infarction: Secondary | ICD-10-CM | POA: Diagnosis not present

## 2018-06-03 DIAGNOSIS — E1122 Type 2 diabetes mellitus with diabetic chronic kidney disease: Secondary | ICD-10-CM | POA: Diagnosis not present

## 2018-06-03 DIAGNOSIS — I69328 Other speech and language deficits following cerebral infarction: Secondary | ICD-10-CM | POA: Diagnosis not present

## 2018-06-03 DIAGNOSIS — I129 Hypertensive chronic kidney disease with stage 1 through stage 4 chronic kidney disease, or unspecified chronic kidney disease: Secondary | ICD-10-CM | POA: Diagnosis not present

## 2018-06-03 DIAGNOSIS — Z48812 Encounter for surgical aftercare following surgery on the circulatory system: Secondary | ICD-10-CM | POA: Diagnosis not present

## 2018-06-03 DIAGNOSIS — N183 Chronic kidney disease, stage 3 (moderate): Secondary | ICD-10-CM | POA: Diagnosis not present

## 2018-06-03 DIAGNOSIS — L89151 Pressure ulcer of sacral region, stage 1: Secondary | ICD-10-CM | POA: Diagnosis not present

## 2018-06-04 DIAGNOSIS — I69391 Dysphagia following cerebral infarction: Secondary | ICD-10-CM | POA: Diagnosis not present

## 2018-06-04 DIAGNOSIS — Z48812 Encounter for surgical aftercare following surgery on the circulatory system: Secondary | ICD-10-CM | POA: Diagnosis not present

## 2018-06-04 DIAGNOSIS — E1122 Type 2 diabetes mellitus with diabetic chronic kidney disease: Secondary | ICD-10-CM | POA: Diagnosis not present

## 2018-06-04 DIAGNOSIS — I129 Hypertensive chronic kidney disease with stage 1 through stage 4 chronic kidney disease, or unspecified chronic kidney disease: Secondary | ICD-10-CM | POA: Diagnosis not present

## 2018-06-04 DIAGNOSIS — I69328 Other speech and language deficits following cerebral infarction: Secondary | ICD-10-CM | POA: Diagnosis not present

## 2018-06-04 DIAGNOSIS — N183 Chronic kidney disease, stage 3 (moderate): Secondary | ICD-10-CM | POA: Diagnosis not present

## 2018-06-04 DIAGNOSIS — L89151 Pressure ulcer of sacral region, stage 1: Secondary | ICD-10-CM | POA: Diagnosis not present

## 2018-06-04 DIAGNOSIS — R1310 Dysphagia, unspecified: Secondary | ICD-10-CM | POA: Diagnosis not present

## 2018-06-04 DIAGNOSIS — C9210 Chronic myeloid leukemia, BCR/ABL-positive, not having achieved remission: Secondary | ICD-10-CM | POA: Diagnosis not present

## 2018-06-09 DIAGNOSIS — R1310 Dysphagia, unspecified: Secondary | ICD-10-CM | POA: Diagnosis not present

## 2018-06-09 DIAGNOSIS — I129 Hypertensive chronic kidney disease with stage 1 through stage 4 chronic kidney disease, or unspecified chronic kidney disease: Secondary | ICD-10-CM | POA: Diagnosis not present

## 2018-06-09 DIAGNOSIS — E1122 Type 2 diabetes mellitus with diabetic chronic kidney disease: Secondary | ICD-10-CM | POA: Diagnosis not present

## 2018-06-09 DIAGNOSIS — L89151 Pressure ulcer of sacral region, stage 1: Secondary | ICD-10-CM | POA: Diagnosis not present

## 2018-06-09 DIAGNOSIS — I69328 Other speech and language deficits following cerebral infarction: Secondary | ICD-10-CM | POA: Diagnosis not present

## 2018-06-09 DIAGNOSIS — N183 Chronic kidney disease, stage 3 (moderate): Secondary | ICD-10-CM | POA: Diagnosis not present

## 2018-06-09 DIAGNOSIS — C9210 Chronic myeloid leukemia, BCR/ABL-positive, not having achieved remission: Secondary | ICD-10-CM | POA: Diagnosis not present

## 2018-06-09 DIAGNOSIS — Z48812 Encounter for surgical aftercare following surgery on the circulatory system: Secondary | ICD-10-CM | POA: Diagnosis not present

## 2018-06-09 DIAGNOSIS — I69391 Dysphagia following cerebral infarction: Secondary | ICD-10-CM | POA: Diagnosis not present

## 2018-06-10 DIAGNOSIS — N183 Chronic kidney disease, stage 3 (moderate): Secondary | ICD-10-CM | POA: Diagnosis not present

## 2018-06-10 DIAGNOSIS — C9210 Chronic myeloid leukemia, BCR/ABL-positive, not having achieved remission: Secondary | ICD-10-CM | POA: Diagnosis not present

## 2018-06-10 DIAGNOSIS — R1310 Dysphagia, unspecified: Secondary | ICD-10-CM | POA: Diagnosis not present

## 2018-06-10 DIAGNOSIS — E1122 Type 2 diabetes mellitus with diabetic chronic kidney disease: Secondary | ICD-10-CM | POA: Diagnosis not present

## 2018-06-10 DIAGNOSIS — L89151 Pressure ulcer of sacral region, stage 1: Secondary | ICD-10-CM | POA: Diagnosis not present

## 2018-06-10 DIAGNOSIS — Z48812 Encounter for surgical aftercare following surgery on the circulatory system: Secondary | ICD-10-CM | POA: Diagnosis not present

## 2018-06-10 DIAGNOSIS — I129 Hypertensive chronic kidney disease with stage 1 through stage 4 chronic kidney disease, or unspecified chronic kidney disease: Secondary | ICD-10-CM | POA: Diagnosis not present

## 2018-06-10 DIAGNOSIS — I69391 Dysphagia following cerebral infarction: Secondary | ICD-10-CM | POA: Diagnosis not present

## 2018-06-10 DIAGNOSIS — I69328 Other speech and language deficits following cerebral infarction: Secondary | ICD-10-CM | POA: Diagnosis not present

## 2018-06-11 DIAGNOSIS — I69328 Other speech and language deficits following cerebral infarction: Secondary | ICD-10-CM | POA: Diagnosis not present

## 2018-06-11 DIAGNOSIS — I129 Hypertensive chronic kidney disease with stage 1 through stage 4 chronic kidney disease, or unspecified chronic kidney disease: Secondary | ICD-10-CM | POA: Diagnosis not present

## 2018-06-11 DIAGNOSIS — N183 Chronic kidney disease, stage 3 (moderate): Secondary | ICD-10-CM | POA: Diagnosis not present

## 2018-06-11 DIAGNOSIS — E1122 Type 2 diabetes mellitus with diabetic chronic kidney disease: Secondary | ICD-10-CM | POA: Diagnosis not present

## 2018-06-11 DIAGNOSIS — L89151 Pressure ulcer of sacral region, stage 1: Secondary | ICD-10-CM | POA: Diagnosis not present

## 2018-06-11 DIAGNOSIS — Z48812 Encounter for surgical aftercare following surgery on the circulatory system: Secondary | ICD-10-CM | POA: Diagnosis not present

## 2018-06-11 DIAGNOSIS — R1310 Dysphagia, unspecified: Secondary | ICD-10-CM | POA: Diagnosis not present

## 2018-06-11 DIAGNOSIS — I69391 Dysphagia following cerebral infarction: Secondary | ICD-10-CM | POA: Diagnosis not present

## 2018-06-11 DIAGNOSIS — C9210 Chronic myeloid leukemia, BCR/ABL-positive, not having achieved remission: Secondary | ICD-10-CM | POA: Diagnosis not present

## 2018-06-15 MED FILL — SPRYCEL 100 MG TABLET: 100 | 30 days supply | Qty: 30 | Fill #1

## 2018-06-17 DIAGNOSIS — I129 Hypertensive chronic kidney disease with stage 1 through stage 4 chronic kidney disease, or unspecified chronic kidney disease: Secondary | ICD-10-CM | POA: Diagnosis not present

## 2018-06-17 DIAGNOSIS — E1122 Type 2 diabetes mellitus with diabetic chronic kidney disease: Secondary | ICD-10-CM | POA: Diagnosis not present

## 2018-06-17 DIAGNOSIS — Z48812 Encounter for surgical aftercare following surgery on the circulatory system: Secondary | ICD-10-CM | POA: Diagnosis not present

## 2018-06-17 DIAGNOSIS — N183 Chronic kidney disease, stage 3 (moderate): Secondary | ICD-10-CM | POA: Diagnosis not present

## 2018-06-17 DIAGNOSIS — I69328 Other speech and language deficits following cerebral infarction: Secondary | ICD-10-CM | POA: Diagnosis not present

## 2018-06-17 DIAGNOSIS — R1310 Dysphagia, unspecified: Secondary | ICD-10-CM | POA: Diagnosis not present

## 2018-06-17 DIAGNOSIS — L89151 Pressure ulcer of sacral region, stage 1: Secondary | ICD-10-CM | POA: Diagnosis not present

## 2018-06-17 DIAGNOSIS — C9210 Chronic myeloid leukemia, BCR/ABL-positive, not having achieved remission: Secondary | ICD-10-CM | POA: Diagnosis not present

## 2018-06-17 DIAGNOSIS — I69391 Dysphagia following cerebral infarction: Secondary | ICD-10-CM | POA: Diagnosis not present

## 2018-07-03 ENCOUNTER — Ambulatory Visit (INDEPENDENT_AMBULATORY_CARE_PROVIDER_SITE_OTHER): Payer: Medicare HMO | Admitting: Internal Medicine

## 2018-07-03 ENCOUNTER — Other Ambulatory Visit: Payer: Self-pay | Admitting: *Deleted

## 2018-07-03 ENCOUNTER — Encounter: Payer: Self-pay | Admitting: Internal Medicine

## 2018-07-03 ENCOUNTER — Telehealth: Payer: Self-pay | Admitting: *Deleted

## 2018-07-03 ENCOUNTER — Other Ambulatory Visit: Payer: Self-pay

## 2018-07-03 DIAGNOSIS — R1310 Dysphagia, unspecified: Secondary | ICD-10-CM | POA: Diagnosis not present

## 2018-07-03 DIAGNOSIS — R1319 Other dysphagia: Secondary | ICD-10-CM

## 2018-07-03 NOTE — Telephone Encounter (Signed)
Called patient, spoke with daughter. She scheduled EGD/DIL with propofol on 6/11 at 9:30am. She is aware pt will need pre-op appt and I will mail this information once scheduled (confirmed address). Orders entered.

## 2018-07-03 NOTE — Progress Notes (Signed)
Referring Provider:  Primary Care Physician:  Redmond School, MD  Primary GI:   Virtual Visit via Telephone Note Due to COVID-19, visit is conducted virtually and was requested by patient.   I connected with April Page on 07/03/18 at  8:00 AM EDT by telephone and verified that I am speaking with the correct person using two identifiers.   I discussed the limitations, risks, security and privacy concerns of performing an evaluation and management service by telephone and the availability of in person appointments. I also discussed with the patient that there may be a patient responsible charge related to this service. The patient expressed understanding and agreed to proceed.  Chief Complaint  Patient presents with  . Dysphagia     History of Present Illness:  Pleasant 73 year old lady with recurrent esophageal dysphagia.  Has multiple comorbidities including CVA March 2018.  Daughter observes she is lost 15 pounds since January of this year.  Just about totally avoids meat and bread because they will not go down.  Sometimes chews food up and spits it out.  Prior to January, she was doing well.  Reportedly had a speech evaluation at home a couple of months ago but I do not have that report for review.  Patient has not had any GERD symptoms and is not on acid suppression therapy.  No odynophagia.  Back in 2004 I saw her for esophageal dysphagia.  EGD revealed a normal esophagus.  Maloney dilator was passed.  This was associated with resolution of her symptoms.  Following her stroke in January 2018 she had a speech therapy evaluation which revealed some oropharyngeal dysfunction which, clinically, essentially resolved over time.  History of CML followed by oncology.  She is not anticoagulated.  She is on no anti-platelet therapy. She has a pacemaker but not a defibrillator.  She is followed by Dr. Curt Bears in cardiology.  Status post thoracentesis for right pleural effusion  thoracentesis back in February of this year (fluid analysis-transudate.  Negative for infection malignancy).  Bowel function reported to be normal.  There is been no melena or hematochezia.  Last colonoscopy 2015 polyp-leiomyoma.  Biopsies negative for scopic colitis.   Past Medical History:  Diagnosis Date  . Arthritis    stiff knees  . Blood dyscrasia   . Cancer ([place])   . CVA (cerebral vascular accident) ([place])   . GERD (gastroesophageal reflux disease)   . H/O: CML (chronic myeloid leukemia)   . Hyperlipidemia   . Hyperlipidemia   . Hypertension   . Obesity   . Pessary maintenance 07/01/2013  . Stroke ([place])    x 3     Past Surgical History:  Procedure Laterality Date  . ABDOMINAL HYSTERECTOMY    . CHOLECYSTECTOMY    . COLONOSCOPY  06/09/2002   HWY:SHUOHFGB hemorrhoids; otherwise normal rectum, colon   . COLONOSCOPY N/A 08/25/2013   MSX:JDBZMCE diverticulosis. Single colonic polyp-removed  s/p segmental biopsy and stool sample. random colon bx negative. +benign leiomyoma.  . cystocele/rectocele repair  2009  . ESOPHAGOGASTRODUODENOSCOPY  06/09/2002   YEM:VVKPQA upper gastrointestinal tract s/p  54-French Maloney dilator  . FEMUR IM NAIL Right 04/12/2016   Procedure: INTRAMEDULLARY (IM) NAIL FEMORAL;  Surgeon: Nicholes Stairs, MD;  Location: [facility];  Service: Orthopedics;  Laterality: Right;  . HERNIA REPAIR    . IR THORACENTESIS ASP PLEURAL SPACE W/IMG GUIDE  05/07/2018  . LOOP RECORDER INSERTION N/A 08/23/2016   Procedure: Loop Recorder Insertion;  Surgeon: Constance Haw,  MD;  Location: [facility];  Service: Cardiovascular;  Laterality: N/A;  . LOOP RECORDER REMOVAL N/A 05/07/2018   Procedure: LOOP RECORDER REMOVAL;  Surgeon: Constance Haw, MD;  Location: [facility];  Service: Cardiovascular;  Laterality: N/A;  . OOPHORECTOMY    . PACEMAKER IMPLANT N/A 05/07/2018   Procedure: PACEMAKER IMPLANT;  Surgeon: Constance Haw, MD;  Location: [facility];  Service: Cardiovascular;  Laterality: N/A;  . TEE WITHOUT CARDIOVERSION N/A 08/23/2016   Procedure: TRANSESOPHAGEAL ECHOCARDIOGRAM (TEE);  Surgeon: Fay Records, MD;  Location: [facility];  Service: Cardiovascular;  Laterality: N/A;     Current Meds  Medication Sig  . acetaminophen (TYLENOL) 325 MG tablet Take 2 tablets (650 mg total) by mouth every 6 (six) hours as needed for mild pain (or temp > 37.5 C (99.5 F)).  Marland Kitchen amLODipine (NORVASC) 10 MG tablet Take 1 tablet (10 mg total) by mouth daily.  Marland Kitchen aspirin 325 MG tablet Take 1 tablet (325 mg total) by mouth daily.  Marland Kitchen atorvastatin (LIPITOR) 40 MG tablet Take 1 tablet (40 mg total) by mouth daily at 6 PM.  . dasatinib (SPRYCEL) 100 MG tablet Take 1 tablet (100 mg total) by mouth daily.  . hydrALAZINE (APRESOLINE) 25 MG tablet Take 1 tablet (25 mg total) by mouth 2 (two) times daily.  Marland Kitchen losartan (COZAAR) 100 MG tablet Take 1 tablet (100 mg total) by mouth daily. Resume on 3/28 (Patient taking differently: Take 100 mg by mouth daily. )  . Multiple Vitamin (MULTIVITAMIN WITH MINERALS) TABS tablet Take 1 tablet by mouth daily.  . Nutritional Supplements (BOOST PO) Take by mouth 2 (two) times daily.  . polyethylene glycol (MIRALAX / GLYCOLAX) packet Take 17 g by mouth daily. (Patient taking differently: Take 17 g by mouth every other day. )  . sennosides-docusate sodium (SENOKOT-S) 8.6-50 MG tablet Take 1 tablet by mouth every evening.      Review of Systems: As in history of present illness.  Observations/Objective: No distress. Unable to perform physical exam due to telephone encounter. No video available.   Assessment and Plan: Pleasant 73 year old lady with multiple comorbidities including recurrent CVA, bradycardia requiring pacemaker and CML presents with 73-month history of esophageal dysphagia-progressive with a 15 pound weight loss.  Some oropharyngeal issues over the years.  I do not have the latest speech pathology  report for review.  However, her history is compelling for esophageal origin.  She presented similarly in 2004.  EGD revealed no structural lesion but empiric dilation of her esophagus resulted in durable improvement in symptoms.   Recommendations: As discussed with patient and her daughter.  Further evaluation of esophageal dysphagia warranted.  We discussed a barium study versus going directly to EGD.  Even if barium study is unrevealing in terms of significant pathology, she still has symptoms and would likely need a esophageal dilation anyway as she has benefited previously. Not mentioned above, she does have poorly fitting dentures and at some point she should see the dentist and have them adjusted. The risks, benefits, limitations, alternatives and imponderables have been reviewed with the patient. Potential for esophageal dilation, biopsy, etc. have also been reviewed.  Questions have been answered. All parties agreeable.  Follow Up Instructions:    I discussed the assessment and treatment plan with the patient. The patient was provided an opportunity to ask questions and all were answered. The patient agreed with the plan and demonstrated an understanding of the instructions.  The patient was advised to call back or seek an in-person evaluation if the symptoms worsen or if the condition fails to improve as anticipated.    Will enlist the assistance of anesthesia or sedation of this nice lady for her upcoming procedure.  I provided 21 minutes of non-face-to-face time during this encounter.  R Garfield Cornea, MD [facility]

## 2018-07-03 NOTE — Patient Instructions (Signed)
As discussed, we will go directly to EGD with esophageal dilation as feasible/appropriate per plan.  We will have the anesthesiologist assist with sedation utilizing propofol.  Recommend you see the dentist to have your dentures adjusted.  We will retrieve the last outpatient speech pathology report for review  Further recommendations to follow.

## 2018-07-03 NOTE — Telephone Encounter (Signed)
PA approved via [employer]. Auth# [id number] dates 08/20/2018-11/18/2018

## 2018-07-07 ENCOUNTER — Other Ambulatory Visit (HOSPITAL_COMMUNITY): Payer: Self-pay

## 2018-07-07 DIAGNOSIS — C9211 Chronic myeloid leukemia, BCR/ABL-positive, in remission: Secondary | ICD-10-CM

## 2018-07-07 DIAGNOSIS — C9210 Chronic myeloid leukemia, BCR/ABL-positive, not having achieved remission: Secondary | ICD-10-CM

## 2018-07-08 ENCOUNTER — Inpatient Hospital Stay (HOSPITAL_COMMUNITY): Payer: Medicare HMO | Attending: Hematology

## 2018-07-08 ENCOUNTER — Other Ambulatory Visit: Payer: Self-pay

## 2018-07-08 DIAGNOSIS — C9210 Chronic myeloid leukemia, BCR/ABL-positive, not having achieved remission: Secondary | ICD-10-CM

## 2018-07-08 DIAGNOSIS — Z9221 Personal history of antineoplastic chemotherapy: Secondary | ICD-10-CM | POA: Insufficient documentation

## 2018-07-08 DIAGNOSIS — C9211 Chronic myeloid leukemia, BCR/ABL-positive, in remission: Secondary | ICD-10-CM | POA: Diagnosis not present

## 2018-07-08 LAB — CBC WITH DIFFERENTIAL/PLATELET
Abs Immature Granulocytes: 0.01 10*3/uL (ref 0.00–0.07)
Basophils Absolute: 0 10*3/uL (ref 0.0–0.1)
Basophils Relative: 1 %
Eosinophils Absolute: 0 10*3/uL (ref 0.0–0.5)
Eosinophils Relative: 0 %
HCT: 31.1 % — ABNORMAL LOW (ref 36.0–46.0)
Hemoglobin: 9.6 g/dL — ABNORMAL LOW (ref 12.0–15.0)
Immature Granulocytes: 0 %
Lymphocytes Relative: 58 %
Lymphs Abs: 2.9 10*3/uL (ref 0.7–4.0)
MCH: 28.8 pg (ref 26.0–34.0)
MCHC: 30.9 g/dL (ref 30.0–36.0)
MCV: 93.4 fL (ref 80.0–100.0)
Monocytes Absolute: 0.3 10*3/uL (ref 0.1–1.0)
Monocytes Relative: 7 %
Neutro Abs: 1.7 10*3/uL (ref 1.7–7.7)
Neutrophils Relative %: 34 %
Platelets: 235 10*3/uL (ref 150–400)
RBC: 3.33 MIL/uL — ABNORMAL LOW (ref 3.87–5.11)
RDW: 16.6 % — ABNORMAL HIGH (ref 11.5–15.5)
WBC: 4.9 10*3/uL (ref 4.0–10.5)
nRBC: 0 % (ref 0.0–0.2)

## 2018-07-08 LAB — COMPREHENSIVE METABOLIC PANEL
ALT: 37 U/L (ref 0–44)
AST: 52 U/L — ABNORMAL HIGH (ref 15–41)
Albumin: 3.5 g/dL (ref 3.5–5.0)
Alkaline Phosphatase: 183 U/L — ABNORMAL HIGH (ref 38–126)
Anion gap: 7 (ref 5–15)
BUN: 24 mg/dL — ABNORMAL HIGH (ref 8–23)
CO2: 20 mmol/L — ABNORMAL LOW (ref 22–32)
Calcium: 8.7 mg/dL — ABNORMAL LOW (ref 8.9–10.3)
Chloride: 114 mmol/L — ABNORMAL HIGH (ref 98–111)
Creatinine, Ser: 1.64 mg/dL — ABNORMAL HIGH (ref 0.44–1.00)
GFR calc Af Amer: 36 mL/min — ABNORMAL LOW (ref >60)
GFR calc non Af Amer: 31 mL/min — ABNORMAL LOW (ref >60)
Glucose, Bld: 83 mg/dL (ref 70–99)
Potassium: 5.2 mmol/L — ABNORMAL HIGH (ref 3.5–5.1)
Sodium: 141 mmol/L (ref 135–145)
Total Bilirubin: 0.4 mg/dL (ref 0.3–1.2)
Total Protein: 7.3 g/dL (ref 6.5–8.1)

## 2018-07-08 LAB — LACTATE DEHYDROGENASE: LDH: 321 U/L — ABNORMAL HIGH (ref 98–192)

## 2018-07-09 MED FILL — SPRYCEL 100 MG TABLET: 100 | 30 days supply | Qty: 30 | Fill #2

## 2018-07-13 LAB — BCR-ABL1, CML/ALL, PCR, QUANT

## 2018-07-15 ENCOUNTER — Inpatient Hospital Stay (HOSPITAL_COMMUNITY): Payer: Medicare HMO | Attending: Hematology | Admitting: Nurse Practitioner

## 2018-07-15 ENCOUNTER — Encounter (HOSPITAL_COMMUNITY): Payer: Self-pay | Admitting: Nurse Practitioner

## 2018-07-15 ENCOUNTER — Other Ambulatory Visit (HOSPITAL_COMMUNITY): Payer: Self-pay | Admitting: Nurse Practitioner

## 2018-07-15 ENCOUNTER — Other Ambulatory Visit: Payer: Self-pay

## 2018-07-15 ENCOUNTER — Inpatient Hospital Stay (HOSPITAL_COMMUNITY): Payer: Medicare HMO

## 2018-07-15 DIAGNOSIS — D508 Other iron deficiency anemias: Secondary | ICD-10-CM

## 2018-07-15 DIAGNOSIS — K219 Gastro-esophageal reflux disease without esophagitis: Secondary | ICD-10-CM | POA: Diagnosis not present

## 2018-07-15 DIAGNOSIS — R1310 Dysphagia, unspecified: Secondary | ICD-10-CM | POA: Insufficient documentation

## 2018-07-15 DIAGNOSIS — C9210 Chronic myeloid leukemia, BCR/ABL-positive, not having achieved remission: Secondary | ICD-10-CM

## 2018-07-15 DIAGNOSIS — Z79899 Other long term (current) drug therapy: Secondary | ICD-10-CM | POA: Insufficient documentation

## 2018-07-15 DIAGNOSIS — Z8673 Personal history of transient ischemic attack (TIA), and cerebral infarction without residual deficits: Secondary | ICD-10-CM | POA: Diagnosis not present

## 2018-07-15 DIAGNOSIS — C9211 Chronic myeloid leukemia, BCR/ABL-positive, in remission: Secondary | ICD-10-CM | POA: Diagnosis not present

## 2018-07-15 DIAGNOSIS — D509 Iron deficiency anemia, unspecified: Secondary | ICD-10-CM

## 2018-07-15 DIAGNOSIS — Z950 Presence of cardiac pacemaker: Secondary | ICD-10-CM | POA: Diagnosis not present

## 2018-07-15 DIAGNOSIS — I10 Essential (primary) hypertension: Secondary | ICD-10-CM | POA: Diagnosis not present

## 2018-07-15 DIAGNOSIS — M1990 Unspecified osteoarthritis, unspecified site: Secondary | ICD-10-CM | POA: Insufficient documentation

## 2018-07-15 DIAGNOSIS — Z9221 Personal history of antineoplastic chemotherapy: Secondary | ICD-10-CM | POA: Diagnosis not present

## 2018-07-15 DIAGNOSIS — R5383 Other fatigue: Secondary | ICD-10-CM | POA: Diagnosis not present

## 2018-07-15 DIAGNOSIS — Z7982 Long term (current) use of aspirin: Secondary | ICD-10-CM

## 2018-07-15 DIAGNOSIS — E785 Hyperlipidemia, unspecified: Secondary | ICD-10-CM | POA: Insufficient documentation

## 2018-07-15 DIAGNOSIS — E875 Hyperkalemia: Secondary | ICD-10-CM

## 2018-07-15 HISTORY — DX: Iron deficiency anemia, unspecified: D50.9

## 2018-07-15 LAB — COMPREHENSIVE METABOLIC PANEL
ALT: 39 U/L (ref 0–44)
AST: 59 U/L — ABNORMAL HIGH (ref 15–41)
Albumin: 3.4 g/dL — ABNORMAL LOW (ref 3.5–5.0)
Alkaline Phosphatase: 184 U/L — ABNORMAL HIGH (ref 38–126)
Anion gap: 11 (ref 5–15)
BUN: 26 mg/dL — ABNORMAL HIGH (ref 8–23)
CO2: 18 mmol/L — ABNORMAL LOW (ref 22–32)
Calcium: 8.6 mg/dL — ABNORMAL LOW (ref 8.9–10.3)
Chloride: 111 mmol/L (ref 98–111)
Creatinine, Ser: 1.71 mg/dL — ABNORMAL HIGH (ref 0.44–1.00)
GFR calc Af Amer: 34 mL/min — ABNORMAL LOW (ref >60)
GFR calc non Af Amer: 29 mL/min — ABNORMAL LOW (ref >60)
Glucose, Bld: 77 mg/dL (ref 70–99)
Potassium: 5.7 mmol/L — ABNORMAL HIGH (ref 3.5–5.1)
Sodium: 140 mmol/L (ref 135–145)
Total Bilirubin: 0.9 mg/dL (ref 0.3–1.2)
Total Protein: 7.4 g/dL (ref 6.5–8.1)

## 2018-07-15 LAB — CBC WITH DIFFERENTIAL/PLATELET
Abs Immature Granulocytes: 0 10*3/uL (ref 0.00–0.07)
Basophils Absolute: 0 10*3/uL (ref 0.0–0.1)
Basophils Relative: 0 %
Eosinophils Absolute: 0 10*3/uL (ref 0.0–0.5)
Eosinophils Relative: 0 %
HCT: 32 % — ABNORMAL LOW (ref 36.0–46.0)
Hemoglobin: 9.7 g/dL — ABNORMAL LOW (ref 12.0–15.0)
Immature Granulocytes: 0 %
Lymphocytes Relative: 42 %
Lymphs Abs: 1.7 10*3/uL (ref 0.7–4.0)
MCH: 28.7 pg (ref 26.0–34.0)
MCHC: 30.3 g/dL (ref 30.0–36.0)
MCV: 94.7 fL (ref 80.0–100.0)
Monocytes Absolute: 0.2 10*3/uL (ref 0.1–1.0)
Monocytes Relative: 6 %
Neutro Abs: 2.1 10*3/uL (ref 1.7–7.7)
Neutrophils Relative %: 52 %
Platelets: 212 10*3/uL (ref 150–400)
RBC: 3.38 MIL/uL — ABNORMAL LOW (ref 3.87–5.11)
RDW: 16.6 % — ABNORMAL HIGH (ref 11.5–15.5)
WBC: 4 10*3/uL (ref 4.0–10.5)
nRBC: 0 % (ref 0.0–0.2)

## 2018-07-15 LAB — FOLATE: Folate: 33 ng/mL (ref >5.9)

## 2018-07-15 LAB — IRON AND TIBC
Iron: 38 ug/dL (ref 28–170)
Saturation Ratios: 14 % (ref 10.4–31.8)
TIBC: 265 ug/dL (ref 250–450)
UIBC: 227 ug/dL

## 2018-07-15 LAB — FERRITIN: Ferritin: 311 ng/mL — ABNORMAL HIGH (ref 11–307)

## 2018-07-15 LAB — VITAMIN B12: Vitamin B-12: 1008 pg/mL — ABNORMAL HIGH (ref 180–914)

## 2018-07-15 MED ORDER — DASATINIB 100 MG PO TABS: 100.0000 mg | ORAL_TABLET | Freq: Every day | ORAL | 11 refills | Status: DC

## 2018-07-15 MED ORDER — SODIUM POLYSTYRENE SULFONATE 15 GM/60ML PO SUSP: 45.0000 g | Freq: Once | ORAL | 0 refills | Status: DC

## 2018-07-15 NOTE — Assessment & Plan Note (Addendum)
1.  Chronic myelogenous leukemia (CML), BCR-ABL1-positive ([place]): -CML in chronic phase. -Diagnosed in March 2016 -Was on Nilotinib 300 mg twice daily started in May 2016. -It was discontinued in July 2018 secondary to recurrent strokes. -Dasatinib 2 mg daily started in February 2019.  Patient reportedly tolerating therapy well.   -Labs done on 07/08/2018 showed WBC 4.9, hemoglobin 9.6, platelets 235.  Potassium was 5.2, and creatinine 1.64.  BCR/ABL is negative -We will continue the Dasatinib at this time. -She denies any shortness of breath, easy bruising, night sweats, fevers, chills, or unexplained weight loss. -We will send her down for labs today to check her iron panel. -If she needs IV iron we will call her daughter [name] at [phone] -We will follow-up in 3 months with repeat labs  2.  Bradycardia: - Patient was noted to have Mobitz 2 AV block. -Pacemaker placed when patient was admitted 05/06/2020 05/08/2018. -Heart rate today is 72 -Patient will follow-up with cardiology as directed.

## 2018-07-15 NOTE — Patient Instructions (Signed)
[place] at [facility] Discharge Instructions  Follow up in 3 months with labs 1 week prior.   Thank you for choosing [place] at [facility] to provide your oncology and hematology care.  To afford each patient quality time with our provider, please arrive at least 15 minutes before your scheduled appointment time.   If you have a lab appointment with the [facility] please come in thru the  Main Entrance and check in at the main information desk  You need to re-schedule your appointment should you arrive 10 or more minutes late.  We strive to give you quality time with our providers, and arriving late affects you and other patients whose appointments are after yours.  Also, if you no show three or more times for appointments you may be dismissed from the clinic at the providers discretion.     Again, thank you for choosing [facility].  Our hope is that these requests will decrease the amount of time that you wait before being seen by our physicians.       _____________________________________________________________  Should you have questions after your visit to [facility], please contact our office at [phone] between the hours of 8:00 a.m. and 4:30 p.m.  Voicemails left after 4:00 p.m. will not be returned until the following business day.  For prescription refill requests, have your pharmacy contact our office and allow 72 hours.    Cancer Center Support Programs:   > Cancer Support Group  2nd Tuesday of the month 1pm-2pm, Journey Room

## 2018-07-15 NOTE — Progress Notes (Signed)
Patients daughter was called and informed of her elevated potassium.  Kayexalate was called in to her pharmacy. -Her daughter was also informed of her iron deficiency and she was set up for 1 infusion of IV iron.

## 2018-07-15 NOTE — Progress Notes (Signed)
April Page, [place] [address]   CLINIC:  Medical Oncology/Hematology  PCP:  April Page, [place] [address] [phone]   REASON FOR VISIT: Follow-up for CML  CURRENT THERAPY: Dasatinib 162m daily  BRIEF ONCOLOGIC HISTORY:    Chronic myelogenous leukemia (CML), BCR-ABL1-positive ([place]   06/01/2014 Tumor Marker    Results for April Page(MRN [id number] as of 12/02/2014 09:30  06/01/2014 09:59 BCR ABL1 / ABL1 IS: 71.220 (H)     07/14/2014 Bone Marrow Biopsy    Bone Marrow, Aspirate,Biopsy, and Clot, right iliac - HYPERCELLULAR BONE MARROW WITH CHRONIC MYELOGENOUS LEUKEMIA. - SEE COMMENT. PERIPHERAL BLOOD: - CHRONIC MYELOGENOUS LEUKEMIA. Diagnosis Note Previous PCR analysis performed for bcr-abl1 is report    07/21/2014 - 08/04/2014 Chemotherapy    Tasigna 300 mg BID    08/04/2014 Miscellaneous    Ran out of 2 week sample secondary to issues with insurance approval.    08/15/2014 -  Chemotherapy    Tasigna 300 mg BID    09/17/2014 Tumor Marker    b3a2 transcript % 4.122     12/02/2014 Tumor Marker    b3a2 transcript: 0.046     03/31/2015 Tumor Marker    b3a2 transcript:  <0.001 %       03/31/2015 Remission    Complete molecular response, no detectable BCR/ABL using PCR assay    05/24/2016 Remission    NEGATIVE for the BCR-ABL1 e1a2 (p190), e13a2 (b2a2, p210) and e14a2 (b3a2, p210) fusion transcripts.      INTERVAL HISTORY:  [name]73y.o. female returns for routine follow-up for CML.  Patient is here today in a wheelchair.  Her daughter is on the phone listening to appointment.  She reports she is fatigued during the day and only watches TV with minimal activity.  She reports she is having more problems swallowing.  She has an appointment with GI for an endoscopy on June 11.  She reports she needs her esophagus stretched again.  She is not drinking as much fluid at this time.  She was encouraged to  start drinking boost or Ensure. Denies any nausea, vomiting, or diarrhea. Denies any new pains. Had not noticed any recent bleeding such as epistaxis, hematuria or hematochezia. Denies recent chest pain on exertion, shortness of breath on minimal exertion, pre-syncopal episodes, or palpitations. Denies any numbness or tingling in hands or feet. Denies any recent fevers, infections, or recent hospitalizations. Patient reports appetite at 25% and energy level at 25%.   REVIEW OF SYSTEMS:  Review of Systems  Constitutional: Positive for fatigue.  HENT:   Positive for trouble swallowing.   Gastrointestinal: Positive for constipation.  All other systems reviewed and are negative.    PAST MEDICAL/SURGICAL HISTORY:  Past Medical History:  Diagnosis Date  . Arthritis    stiff knees  . Blood dyscrasia   . Cancer ([place]   . CVA (cerebral vascular accident) ([place]   . GERD (gastroesophageal reflux disease)   . H/O: CML (chronic myeloid leukemia)   . Hyperlipidemia   . Hyperlipidemia   . Hypertension   . Obesity   . Pessary maintenance 07/01/2013  . Stroke ([place]    x 3   Past Surgical History:  Procedure Laterality Date  . ABDOMINAL HYSTERECTOMY    . CHOLECYSTECTOMY    . COLONOSCOPY  06/09/2002   ROMB:TDHRCBULhemorrhoids; otherwise normal rectum, colon   . COLONOSCOPY N/A 08/25/2013   RAGT:XMIWOEHdiverticulosis. Single colonic  polyp-removed  s/p segmental biopsy and stool sample. random colon bx negative. +benign leiomyoma.  . cystocele/rectocele repair  2009  . ESOPHAGOGASTRODUODENOSCOPY  06/09/2002   KGS:UPJSRP upper gastrointestinal tract s/p  54-French Maloney dilator  . FEMUR IM NAIL Right 04/12/2016   Procedure: INTRAMEDULLARY (IM) NAIL FEMORAL;  Surgeon: Nicholes Stairs, MD;  Location: [facility];  Service: Orthopedics;  Laterality: Right;  . HERNIA REPAIR    . IR THORACENTESIS ASP PLEURAL SPACE W/IMG GUIDE  05/07/2018  . LOOP RECORDER INSERTION N/A 08/23/2016   Procedure: Loop  Recorder Insertion;  Surgeon: Constance Haw, MD;  Location: [facility];  Service: Cardiovascular;  Laterality: N/A;  . LOOP RECORDER REMOVAL N/A 05/07/2018   Procedure: LOOP RECORDER REMOVAL;  Surgeon: Constance Haw, MD;  Location: [facility];  Service: Cardiovascular;  Laterality: N/A;  . OOPHORECTOMY    . PACEMAKER IMPLANT N/A 05/07/2018   Procedure: PACEMAKER IMPLANT;  Surgeon: Constance Haw, MD;  Location: [facility];  Service: Cardiovascular;  Laterality: N/A;  . TEE WITHOUT CARDIOVERSION N/A 08/23/2016   Procedure: TRANSESOPHAGEAL ECHOCARDIOGRAM (TEE);  Surgeon: Fay Records, MD;  Location: [facility] ENDOSCOPY;  Service: Cardiovascular;  Laterality: N/A;     SOCIAL HISTORY:  Social History   Socioeconomic History  . Marital status: Widowed    Spouse name: Not on file  . Number of children: 6  . Years of education: associate  . Highest education level: Not on file  Occupational History    Employer: RETIRED  Social Needs  . Financial resource strain: Not on file  . Food insecurity:    Worry: Not on file    Inability: Not on file  . Transportation needs:    Medical: Not on file    Non-medical: Not on file  Tobacco Use  . Smoking status: Never Smoker  . Smokeless tobacco: Never Used  Substance and Sexual Activity  . Alcohol use: No  . Drug use: No  . Sexual activity: Not Currently    Birth control/protection: Surgical    Comment: hyst  Lifestyle  . Physical activity:    Days per week: Not on file    Minutes per session: Not on file  . Stress: Not on file  Relationships  . Social connections:    Talks on phone: Not on file    Gets together: Not on file    Attends religious service: Not on file    Active member of club or organization: Not on file    Attends meetings of clubs or organizations: Not on file    Relationship status: Not on file  . Intimate partner violence:    Fear of current or ex partner: Not on file     Emotionally abused: Not on file    Physically abused: Not on file    Forced sexual activity: Not on file  Other Topics Concern  . Not on file  Social History Narrative  . Not on file    FAMILY HISTORY:  Family History  Problem Relation Age of Onset  . Anuerysm Mother        deceased age 52, brain anuerysm  . Early death Mother 27  . Heart disease Father   . Hypertension Sister   . Obesity Brother   . Hypertension Sister   . Arthritis Sister   . Other Son        cardiac arrest  . Heart disease Child 32       cardiac  arrest  . Colon cancer Neg Hx     CURRENT MEDICATIONS:  Outpatient Encounter Medications as of 07/15/2018  Medication Sig  . acetaminophen (TYLENOL) 325 MG tablet Take 2 tablets (650 mg total) by mouth every 6 (six) hours as needed for mild pain (or temp > 37.5 C (99.5 F)).  Marland Kitchen amLODipine (NORVASC) 10 MG tablet Take 1 tablet (10 mg total) by mouth daily.  Marland Kitchen aspirin 325 MG tablet Take 1 tablet (325 mg total) by mouth daily.  Marland Kitchen atorvastatin (LIPITOR) 40 MG tablet Take 1 tablet (40 mg total) by mouth daily at 6 PM.  . dasatinib (SPRYCEL) 100 MG tablet Take 1 tablet (100 mg total) by mouth daily.  . feeding supplement, ENSURE ENLIVE, (ENSURE ENLIVE) LIQD Take 237 mLs by mouth 2 (two) times daily between meals.  . hydrALAZINE (APRESOLINE) 25 MG tablet Take 1 tablet (25 mg total) by mouth 2 (two) times daily.  Marland Kitchen losartan (COZAAR) 100 MG tablet Take 1 tablet (100 mg total) by mouth daily. Resume on 3/28 (Patient taking differently: Take 100 mg by mouth daily. )  . Multiple Vitamin (MULTIVITAMIN WITH MINERALS) TABS tablet Take 1 tablet by mouth daily.  . Nutritional Supplements (BOOST PO) Take by mouth 2 (two) times daily.  . polyethylene glycol (MIRALAX / GLYCOLAX) packet Take 17 g by mouth daily. (Patient taking differently: Take 17 g by mouth every other day. )  . sennosides-docusate sodium (SENOKOT-S) 8.6-50 MG tablet Take 1 tablet by mouth every evening.   No  facility-administered encounter medications on file as of 07/15/2018.     ALLERGIES:  No Known Allergies   PHYSICAL EXAM:  ECOG Performance status: 2  Vitals:   07/15/18 1115  BP: (!) 108/46  Pulse: 72  Resp: 18  Temp: 98 F (36.7 C)  SpO2: 100%   Filed Weights   07/15/18 1115  Weight: 112 lb 4.8 oz (50.9 kg)    Physical Exam Constitutional:      Appearance: Normal appearance. She is normal weight.  Cardiovascular:     Rate and Rhythm: Normal rate and regular rhythm.     Heart sounds: Normal heart sounds.  Pulmonary:     Effort: Pulmonary effort is normal.     Breath sounds: Normal breath sounds.  Abdominal:     General: Bowel sounds are normal.     Palpations: Abdomen is soft.  Musculoskeletal:     Comments: wheelchair  Skin:    General: Skin is warm and dry.  Neurological:     Mental Status: She is alert and oriented to person, place, and time. Mental status is at baseline.  Psychiatric:        Mood and Affect: Mood normal.        Behavior: Behavior normal.        Thought Content: Thought content normal.        Judgment: Judgment normal.      LABORATORY DATA:  I have reviewed the labs as listed.  CBC    Component Value Date/Time   WBC 4.9 07/08/2018 1220   RBC 3.33 (L) 07/08/2018 1220   HGB 9.6 (L) 07/08/2018 1220   HCT 31.1 (L) 07/08/2018 1220   PLT 235 07/08/2018 1220   MCV 93.4 07/08/2018 1220   MCH 28.8 07/08/2018 1220   MCHC 30.9 07/08/2018 1220   RDW 16.6 (H) 07/08/2018 1220   LYMPHSABS 2.9 07/08/2018 1220   MONOABS 0.3 07/08/2018 1220   EOSABS 0.0 07/08/2018 1220   BASOSABS 0.0 07/08/2018  1220   CMP Latest Ref Rng & Units 07/08/2018 05/11/2018 05/08/2018  Glucose 70 - 99 mg/dL 83 100(H) 79  BUN 8 - 23 mg/dL 24(H) 20 10  Creatinine 0.44 - 1.00 mg/dL 1.64(H) 1.18(H) 1.15(H)  Sodium 135 - 145 mmol/L 141 138 138  Potassium 3.5 - 5.1 mmol/L 5.2(H) 4.5 3.9  Chloride 98 - 111 mmol/L 114(H) 108 109  CO2 22 - 32 mmol/L 20(L) 23 20(L)  Calcium  8.9 - 10.3 mg/dL 8.7(L) 8.6(L) 8.1(L)  Total Protein 6.5 - 8.1 g/dL 7.3 6.2(L) -  Total Bilirubin 0.3 - 1.2 mg/dL 0.4 0.5 -  Alkaline Phos 38 - 126 U/L 183(H) 137(H) -  AST 15 - 41 U/L 52(H) 44(H) -  ALT 0 - 44 U/L 37 24 -    I personally performed a face-to-face visit.  All questions were answered to patient's stated satisfaction. Encouraged patient to call with any new concerns or questions before his next visit to the cancer center and we can certain see him sooner, if needed.     ASSESSMENT & PLAN:   Chronic myelogenous leukemia (CML), BCR-ABL1-positive ([place]) 1.  Chronic myelogenous leukemia (CML), BCR-ABL1-positive ([place]): -CML in chronic phase. -Diagnosed in March 2016 -Was on Nilotinib 300 mg twice daily started in May 2016. -It was discontinued in July 2018 secondary to recurrent strokes. -Dasatinib 2 mg daily started in February 2019.  Patient reportedly tolerating therapy well.   -Labs done on 07/08/2018 showed WBC 4.9, hemoglobin 9.6, platelets 235.  Potassium was 5.2, and creatinine 1.64.  BCR/ABL is negative -We will continue the Dasatinib at this time. -She denies any shortness of breath, easy bruising, night sweats, fevers, chills, or unexplained weight loss. -We will send her down for labs today to check her iron panel. -If she needs IV iron we will call her daughter [name] at [phone] -We will follow-up in 3 months with repeat labs  2.  Bradycardia: - Patient was noted to have Mobitz 2 AV block. -Pacemaker placed when patient was admitted 05/06/2020 05/08/2018. -Heart rate today is 72 -Patient will follow-up with cardiology as directed.       Orders placed this encounter:  Orders Placed This Encounter  Procedures  . CBC with Differential/Platelet  . Comprehensive metabolic panel  . Ferritin  . Iron and TIBC  . VITAMIN D 25 Hydroxy (Vit-D Deficiency, Fractures)  . Vitamin B12  . Folate  . Lactate dehydrogenase  . BCR-ABL1, CML/ALL, PCR, QUANT  . CBC  with Differential/Platelet  . Comprehensive metabolic panel  . Ferritin  . Iron and TIBC  . Vitamin B12  . VITAMIN D 25 Hydroxy (Vit-D Deficiency, Fractures)  . Folate     Francene Finders, FNP-C [place] [phone]

## 2018-07-16 LAB — VITAMIN D 25 HYDROXY (VIT D DEFICIENCY, FRACTURES): Vit D, 25-Hydroxy: 34.9 ng/mL (ref 30.0–100.0)

## 2018-07-21 ENCOUNTER — Other Ambulatory Visit (HOSPITAL_COMMUNITY): Payer: Medicare HMO

## 2018-07-24 ENCOUNTER — Ambulatory Visit (HOSPITAL_COMMUNITY): Payer: Medicare HMO

## 2018-07-29 ENCOUNTER — Inpatient Hospital Stay (HOSPITAL_COMMUNITY): Payer: Medicare HMO

## 2018-07-29 ENCOUNTER — Other Ambulatory Visit: Payer: Self-pay

## 2018-07-29 ENCOUNTER — Encounter (HOSPITAL_COMMUNITY): Payer: Self-pay

## 2018-07-29 VITALS — BP 102/45 | HR 74 | Temp 98.0°F | Resp 16

## 2018-07-29 DIAGNOSIS — C9211 Chronic myeloid leukemia, BCR/ABL-positive, in remission: Secondary | ICD-10-CM | POA: Diagnosis not present

## 2018-07-29 DIAGNOSIS — Z9221 Personal history of antineoplastic chemotherapy: Secondary | ICD-10-CM | POA: Diagnosis not present

## 2018-07-29 DIAGNOSIS — K219 Gastro-esophageal reflux disease without esophagitis: Secondary | ICD-10-CM | POA: Diagnosis not present

## 2018-07-29 DIAGNOSIS — R1310 Dysphagia, unspecified: Secondary | ICD-10-CM | POA: Diagnosis not present

## 2018-07-29 DIAGNOSIS — E785 Hyperlipidemia, unspecified: Secondary | ICD-10-CM | POA: Diagnosis not present

## 2018-07-29 DIAGNOSIS — M1990 Unspecified osteoarthritis, unspecified site: Secondary | ICD-10-CM | POA: Diagnosis not present

## 2018-07-29 DIAGNOSIS — R5383 Other fatigue: Secondary | ICD-10-CM | POA: Diagnosis not present

## 2018-07-29 DIAGNOSIS — Z8673 Personal history of transient ischemic attack (TIA), and cerebral infarction without residual deficits: Secondary | ICD-10-CM | POA: Diagnosis not present

## 2018-07-29 DIAGNOSIS — I10 Essential (primary) hypertension: Secondary | ICD-10-CM | POA: Diagnosis not present

## 2018-07-29 DIAGNOSIS — D508 Other iron deficiency anemias: Secondary | ICD-10-CM

## 2018-07-29 MED ORDER — SODIUM CHLORIDE 0.9 % IV SOLN
Freq: Once | INTRAVENOUS | Status: AC
  Administered 2018-07-29: 09:00:00 via INTRAVENOUS

## 2018-07-29 MED ORDER — SODIUM CHLORIDE 0.9 % IV SOLN
510.0000 mg | Freq: Once | INTRAVENOUS | Status: AC
  Administered 2018-07-29: 510 mg via INTRAVENOUS
  Filled 2018-07-29: qty 510

## 2018-07-29 NOTE — Progress Notes (Signed)
Treatment given per orders. Patient tolerated it well without problems. Vitals stable and discharged home from clinic via wheelchair Follow up as scheduled.  

## 2018-07-29 NOTE — Patient Instructions (Signed)
[facility] at [facility]  Discharge Instructions:   _______________________________________________________________  Thank you for choosing [facility] at [facility] to provide your oncology and hematology care.  To afford each patient quality time with our providers, please arrive at least 15 minutes before your scheduled appointment.  You need to re-schedule your appointment if you arrive 10 or more minutes late.  We strive to give you quality time with our providers, and arriving late affects you and other patients whose appointments are after yours.  Also, if you no show three or more times for appointments you may be dismissed from the clinic.  Again, thank you for choosing  [facility]. Our hope is that these requests will allow you access to exceptional care and in a timely manner. _______________________________________________________________  If you have questions after your visit, please contact our office at [phone] between the hours of 8:30 a.m. and 5:00 p.m. Voicemails left after 4:30 p.m. will not be returned until the following business day. _______________________________________________________________  For prescription refill requests, have your pharmacy contact our office. _______________________________________________________________  Recommendations made by the consultant and any test results will be sent to your referring physician. _______________________________________________________________ 

## 2018-08-04 ENCOUNTER — Telehealth: Payer: Self-pay | Admitting: *Deleted

## 2018-08-04 ENCOUNTER — Ambulatory Visit (INDEPENDENT_AMBULATORY_CARE_PROVIDER_SITE_OTHER): Payer: Medicare HMO | Admitting: *Deleted

## 2018-08-04 DIAGNOSIS — I639 Cerebral infarction, unspecified: Secondary | ICD-10-CM | POA: Diagnosis not present

## 2018-08-04 DIAGNOSIS — I441 Atrioventricular block, second degree: Secondary | ICD-10-CM

## 2018-08-04 LAB — CUP PACEART REMOTE DEVICE CHECK
Battery Remaining Longevity: 134 mo
Battery Voltage: 3.18 V
Brady Statistic AP VP Percent: 3.37 %
Brady Statistic AP VS Percent: 0.28 %
Brady Statistic AS VP Percent: 84.81 %
Brady Statistic AS VS Percent: 11.54 %
Brady Statistic RA Percent Paced: 3.64 %
Brady Statistic RV Percent Paced: 88.18 %
Date Time Interrogation Session: 20200525231106
Implantable Lead Implant Date: 20200227
Implantable Lead Implant Date: 20200227
Implantable Lead Location: 753859
Implantable Lead Location: 753860
Implantable Lead Model: 5076
Implantable Lead Model: 5076
Implantable Pulse Generator Implant Date: 20200227
Lead Channel Impedance Value: 228 Ohm
Lead Channel Impedance Value: 285 Ohm
Lead Channel Impedance Value: 323 Ohm
Lead Channel Impedance Value: 399 Ohm
Lead Channel Pacing Threshold Amplitude: 0.5 V
Lead Channel Pacing Threshold Amplitude: 0.5 V
Lead Channel Pacing Threshold Pulse Width: 0.4 ms
Lead Channel Pacing Threshold Pulse Width: 0.4 ms
Lead Channel Sensing Intrinsic Amplitude: 1.5 mV
Lead Channel Sensing Intrinsic Amplitude: 1.5 mV
Lead Channel Sensing Intrinsic Amplitude: 9.25 mV
Lead Channel Sensing Intrinsic Amplitude: 9.25 mV
Lead Channel Setting Pacing Amplitude: 3.5 V
Lead Channel Setting Pacing Amplitude: 3.5 V
Lead Channel Setting Pacing Pulse Width: 0.4 ms
Lead Channel Setting Sensing Sensitivity: 2 mV

## 2018-08-04 NOTE — Telephone Encounter (Signed)

## 2018-08-06 ENCOUNTER — Other Ambulatory Visit: Payer: Self-pay

## 2018-08-06 ENCOUNTER — Ambulatory Visit: Payer: Medicare HMO | Admitting: Podiatry

## 2018-08-06 ENCOUNTER — Encounter: Payer: Medicare HMO | Admitting: Cardiology

## 2018-08-06 ENCOUNTER — Encounter: Payer: Self-pay | Admitting: Podiatry

## 2018-08-06 ENCOUNTER — Ambulatory Visit (INDEPENDENT_AMBULATORY_CARE_PROVIDER_SITE_OTHER): Payer: Medicare HMO | Admitting: Podiatry

## 2018-08-06 VITALS — Temp 97.9°F

## 2018-08-06 DIAGNOSIS — Q828 Other specified congenital malformations of skin: Secondary | ICD-10-CM

## 2018-08-06 DIAGNOSIS — M79674 Pain in right toe(s): Secondary | ICD-10-CM

## 2018-08-06 DIAGNOSIS — B351 Tinea unguium: Secondary | ICD-10-CM | POA: Diagnosis not present

## 2018-08-06 DIAGNOSIS — E119 Type 2 diabetes mellitus without complications: Secondary | ICD-10-CM

## 2018-08-06 DIAGNOSIS — M79675 Pain in left toe(s): Secondary | ICD-10-CM

## 2018-08-06 MED ORDER — CLOTRIMAZOLE 1 % EX CREA: TOPICAL_CREAM | CUTANEOUS | 1 refills | Status: DC

## 2018-08-06 NOTE — Progress Notes (Signed)
Electrophysiology TeleHealth Note   Due to national recommendations of social distancing due to COVID 19, an audio/video telehealth visit is felt to be most appropriate for this patient at this time.  See Epic message for the patient's consent to telehealth for [facility].   Date:  08/07/2018   ID:  April Page, DOB [birth year], MRN [id number]  Location: patient's home  Provider location: [address], [place]  Evaluation Performed: Follow-up visit  PCP:  Redmond School, MD  Cardiologist:  No primary care provider on file.  Electrophysiologist:  Dr Curt Bears  Chief Complaint:  pacemaker  History of Present Illness:    April Page is a 73 y.o. female who presents via audio/video conferencing for a telehealth visit today.  Since last being seen in our clinic, the patient reports doing very well.  Today, she denies symptoms of palpitations, chest pain, shortness of breath,  lower extremity edema, dizziness, presyncope, or syncope.  The patient is otherwise without complaint today.  The patient denies symptoms of fevers, chills, cough, or new SOB worrisome for COVID 19.  History of HTN, CVA and 2:1 AV block s/p medtronic pacemaker 05/07/18.  Today, denies symptoms of palpitations, chest pain, shortness of breath, orthopnea, PND, lower extremity edema, claudication, dizziness, presyncope, syncope, bleeding, or neurologic sequela. The patient is tolerating medications without difficulties.  She does say that she is having difficulties swallowing and a poor appetite.  Her primary care physician is in the process of the work-up.  She has plans for esophageal dilation coming up in June.  Past Medical History:  Diagnosis Date  . Arthritis    stiff knees  . Blood dyscrasia   . Cancer ([place])   . CVA (cerebral vascular accident) ([place])   . GERD (gastroesophageal reflux disease)   . H/O: CML (chronic myeloid leukemia)   . Hyperlipidemia   . Hyperlipidemia   .  Hypertension   . Iron deficiency anemia 07/15/2018  . Obesity   . Pessary maintenance 07/01/2013  . Stroke ([place])    x 3    Past Surgical History:  Procedure Laterality Date  . ABDOMINAL HYSTERECTOMY    . CHOLECYSTECTOMY    . COLONOSCOPY  06/09/2002   GNO:IBBCWUGQ hemorrhoids; otherwise normal rectum, colon   . COLONOSCOPY N/A 08/25/2013   BVQ:[id number] diverticulosis. Single colonic polyp-removed  s/p segmental biopsy and stool sample. random colon bx negative. +benign leiomyoma.  . cystocele/rectocele repair  2009  . ESOPHAGOGASTRODUODENOSCOPY  06/09/2002   EKC:[id number] upper gastrointestinal tract s/p  54-French Maloney dilator  . FEMUR IM NAIL Right 04/12/2016   Procedure: INTRAMEDULLARY (IM) NAIL FEMORAL;  Surgeon: Nicholes Stairs, MD;  Location: [facility];  Service: Orthopedics;  Laterality: Right;  . HERNIA REPAIR    . IR THORACENTESIS ASP PLEURAL SPACE W/IMG GUIDE  05/07/2018  . LOOP RECORDER INSERTION N/A 08/23/2016   Procedure: Loop Recorder Insertion;  Surgeon: Constance Haw, MD;  Location: [facility];  Service: Cardiovascular;  Laterality: N/A;  . LOOP RECORDER REMOVAL N/A 05/07/2018   Procedure: LOOP RECORDER REMOVAL;  Surgeon: Constance Haw, MD;  Location: [facility] [facility];  Service: Cardiovascular;  Laterality: N/A;  . OOPHORECTOMY    . PACEMAKER IMPLANT N/A 05/07/2018   Procedure: PACEMAKER IMPLANT;  Surgeon: Constance Haw, MD;  Location: [place] [facility];  Service: Cardiovascular;  Laterality: N/A;  . TEE WITHOUT CARDIOVERSION N/A 08/23/2016   Procedure: TRANSESOPHAGEAL ECHOCARDIOGRAM (TEE);  Surgeon: Fay Records,  MD;  Location: [facility];  Service: Cardiovascular;  Laterality: N/A;    Current Outpatient Medications  Medication Sig Dispense Refill  . acetaminophen (TYLENOL) 325 MG tablet Take 2 tablets (650 mg total) by mouth every 6 (six) hours as needed for mild pain (or temp > 37.5 C (99.5 F)).    Marland Kitchen amLODipine (NORVASC) 10 MG tablet Take  1 tablet (10 mg total) by mouth daily. 30 tablet 0  . aspirin 325 MG tablet Take 1 tablet (325 mg total) by mouth daily. 30 tablet 4  . atorvastatin (LIPITOR) 40 MG tablet Take 1 tablet (40 mg total) by mouth daily at 6 PM. 30 tablet 0  . clotrimazole (LOTRIMIN) 1 % cream Apply to both feet and between toes twice daily for 4 weeks. 30 g 1  . dasatinib (SPRYCEL) 100 MG tablet Take 1 tablet (100 mg total) by mouth daily. 30 tablet 11  . feeding supplement, ENSURE ENLIVE, (ENSURE ENLIVE) LIQD Take 237 mLs by mouth 2 (two) times daily between meals. 237 mL 12  . hydrALAZINE (APRESOLINE) 25 MG tablet Take 1 tablet (25 mg total) by mouth 2 (two) times daily. 60 tablet 0  . losartan (COZAAR) 100 MG tablet Take 1 tablet (100 mg total) by mouth daily. Resume on 3/28 (Patient taking differently: Take 100 mg by mouth daily. ) 30 tablet 0  . Multiple Vitamin (MULTIVITAMIN WITH MINERALS) TABS tablet Take 1 tablet by mouth daily.    . Nutritional Supplements (BOOST PO) Take by mouth 2 (two) times daily.    . polyethylene glycol (MIRALAX / GLYCOLAX) packet Take 17 g by mouth daily. (Patient taking differently: Take 17 g by mouth every other day. ) 14 each 0  . sennosides-docusate sodium (SENOKOT-S) 8.6-50 MG tablet Take 1 tablet by mouth every evening.     No current facility-administered medications for this visit.     Allergies:   Patient has no known allergies.   Social History:  The patient  reports that she has never smoked. She has never used smokeless tobacco. She reports that she does not drink alcohol or use drugs.   Family History:  The patient's  family history includes Anuerysm in her mother; Arthritis in her sister; Early death (age of onset: 108) in her mother; Heart disease in her father; Heart disease (age of onset: 2) in her child; Hypertension in her sister and sister; Obesity in her brother; Other in her son.   ROS:  Please see the history of present illness.   All other systems are  personally reviewed and negative.    Exam:    Vital Signs:  There were no vitals taken for this visit.  Well appearing, alert and conversant, regular work of breathing,  good skin color Eyes- anicteric, neuro- grossly intact, skin- no apparent rash or lesions or cyanosis, mouth- oral mucosa is pink   Labs/Other Tests and Data Reviewed:    Recent Labs: 05/06/2018: Magnesium 2.0; TSH 1.431 05/07/2018: B Natriuretic Peptide 98.6 07/15/2018: ALT 39; BUN 26; Creatinine, Ser 1.71; Hemoglobin 9.7; Platelets 212; Potassium 5.7; Sodium 140   Wt Readings from Last 3 Encounters:  07/15/18 112 lb 4.8 oz (50.9 kg)  05/08/18 144 lb 13.5 oz (65.7 kg)  12/03/17 148 lb 1.6 oz (67.2 kg)     Other studies personally reviewed: Additional studies/ records that were reviewed today include: ECG 05/08/18 personally reviewed  Review of the above records today demonstrates:  A sense, V paced  Last device remote is reviewed  from [facility] PDF dated 08/04/18 which reveals normal device function, no arrhythmias    ASSESSMENT & PLAN:    1.  2:1 AV block. S/p medtronic dueal chamber pacemaker implanted 05/07/18.  Device functioning appropriately.  The patient has no major complaints at this time.  No changes.  2. Hypertension: Has not been checked as the patient does not have a blood pressure cuff.  No changes.  3. Recurrent stroke: monitoring with pacemaker  COVID 19 screen The patient denies symptoms of COVID 19 at this time.  The importance of social distancing was discussed today.  Follow-up: 9 months  Current medicines are reviewed at length with the patient today.   The patient does not have concerns regarding her medicines.  The following changes were made today:  none  Labs/ tests ordered today include:  No orders of the defined types were placed in this encounter.    Patient Risk:  after full review of this patients clinical status, I feel that they are at moderate risk at this time.  Today, I  have spent 9 minutes with the patient with telehealth technology discussing pacemaker.    Signed,  Meredith Leeds, MD  08/07/2018 8:19 AM     [facility] [address] [place] [address] [phone] (office) [phone] (fax)

## 2018-08-06 NOTE — Patient Instructions (Signed)
Corns and Calluses Corns are small areas of thickened skin that occur on the top, sides, or tip of a toe. They contain a cone-shaped core with a point that can press on a nerve below. This causes pain.  Calluses are areas of thickened skin that can occur anywhere on the body, including the hands, fingers, palms, soles of the feet, and heels. Calluses are usually larger than corns. What are the causes? Corns and calluses are caused by rubbing (friction) or pressure, such as from shoes that are too tight or do not fit properly. What increases the risk? Corns are more likely to develop in people who have misshapen toes (toe deformities), such as hammer toes. Calluses can occur with friction to any area of the skin. They are more likely to develop in people who:  Work with their hands.  Wear shoes that fit poorly, are too tight, or are high-heeled.  Have toe deformities. What are the signs or symptoms? Symptoms of a corn or callus include:  A hard growth on the skin.  Pain or tenderness under the skin.  Redness and swelling.  Increased discomfort while wearing tight-fitting shoes, if your feet are affected. If a corn or callus becomes infected, symptoms may include:  Redness and swelling that gets worse.  Pain.  Fluid, blood, or pus draining from the corn or callus. How is this diagnosed? Corns and calluses may be diagnosed based on your symptoms, your medical history, and a physical exam. How is this treated? Treatment for corns and calluses may include:  Removing the cause of the friction or pressure. This may involve: ? Changing your shoes. ? Wearing shoe inserts (orthotics) or other protective layers in your shoes, such as a corn pad. ? Wearing gloves.  Applying medicine to the skin (topical medicine) to help soften skin in the hardened, thickened areas.  Removing layers of dead skin with a file to reduce the size of the corn or callus.  Removing the corn or callus with a  scalpel or laser.  Taking antibiotic medicines, if your corn or callus is infected.  Having surgery, if a toe deformity is the cause. Follow these instructions at home:   Take over-the-counter and prescription medicines only as told by your health care provider.  If you were prescribed an antibiotic, take it as told by your health care provider. Do not stop taking it even if your condition starts to improve.  Wear shoes that fit well. Avoid wearing high-heeled shoes and shoes that are too tight or too loose.  Wear any padding, protective layers, gloves, or orthotics as told by your health care provider.  Soak your hands or feet and then use a file or pumice stone to soften your corn or callus. Do this as told by your health care provider.  Check your corn or callus every day for symptoms of infection. Contact a health care provider if you:  Notice that your symptoms do not improve with treatment.  Have redness or swelling that gets worse.  Notice that your corn or callus becomes painful.  Have fluid, blood, or pus coming from your corn or callus.  Have new symptoms. Summary  Corns are small areas of thickened skin that occur on the top, sides, or tip of a toe.  Calluses are areas of thickened skin that can occur anywhere on the body, including the hands, fingers, palms, and soles of the feet. Calluses are usually larger than corns.  Corns and calluses are caused by   rubbing (friction) or pressure, such as from shoes that are too tight or do not fit properly.  Treatment may include wearing any padding, protective layers, gloves, or orthotics as told by your health care provider. This information is not intended to replace advice given to you by your health care provider. Make sure you discuss any questions you have with your health care provider. Document Released: 12/02/2003 Document Revised: 01/08/2017 Document Reviewed: 01/08/2017 Elsevier Interactive Patient Education   2019 [employer]  Onychomycosis/Fungal Toenails  WHAT IS IT? An infection that lies within the keratin of your nail plate that is caused by a fungus.  WHY ME? Fungal infections affect all ages, sexes, races, and creeds.  There may be many factors that predispose you to a fungal infection such as age, coexisting medical conditions such as diabetes, or an autoimmune disease; stress, medications, fatigue, genetics, etc.  Bottom line: fungus thrives in a warm, moist environment and your shoes offer such a location.  IS IT CONTAGIOUS? Theoretically, yes.  You do not want to share shoes, nail clippers or files with someone who has fungal toenails.  Walking around barefoot in the same room or sleeping in the same bed is unlikely to transfer the organism.  It is important to realize, however, that fungus can spread easily from one nail to the next on the same foot.  HOW DO WE TREAT THIS?  There are several ways to treat this condition.  Treatment may depend on many factors such as age, medications, pregnancy, liver and kidney conditions, etc.  It is best to ask your doctor which options are available to you.  1. No treatment.   Unlike many other medical concerns, you can live with this condition.  However for many people this can be a painful condition and may lead to ingrown toenails or a bacterial infection.  It is recommended that you keep the nails cut short to help reduce the amount of fungal nail. 2. Topical treatment.  These range from herbal remedies to prescription strength nail lacquers.  About 40-50% effective, topicals require twice daily application for approximately 9 to 12 months or until an entirely new nail has grown out.  The most effective topicals are medical grade medications available through physicians offices. 3. Oral antifungal medications.  With an 80-90% cure rate, the most common oral medication requires 3 to 4 months of therapy and stays in your system for a year as the new nail  grows out.  Oral antifungal medications do require blood work to make sure it is a safe drug for you.  A liver function panel will be performed prior to starting the medication and after the first month of treatment.  It is important to have the blood work performed to avoid any harmful side effects.  In general, this medication safe but blood work is required. 4. Laser Therapy.  This treatment is performed by applying a specialized laser to the affected nail plate.  This therapy is noninvasive, fast, and non-painful.  It is not covered by insurance and is therefore, out of pocket.  The results have been very good with a 80-95% cure rate.  The [facility] is the only practice in the area to offer this therapy. 5. Permanent Nail Avulsion.  Removing the entire nail so that a new nail will not grow back. 

## 2018-08-07 ENCOUNTER — Telehealth (INDEPENDENT_AMBULATORY_CARE_PROVIDER_SITE_OTHER): Payer: 59 | Admitting: Cardiology

## 2018-08-07 ENCOUNTER — Telehealth: Payer: Medicare HMO | Admitting: Cardiology

## 2018-08-07 ENCOUNTER — Other Ambulatory Visit: Payer: Self-pay

## 2018-08-07 DIAGNOSIS — R001 Bradycardia, unspecified: Secondary | ICD-10-CM

## 2018-08-09 ENCOUNTER — Encounter: Payer: Self-pay | Admitting: Podiatry

## 2018-08-09 NOTE — Progress Notes (Signed)
Subjective:  April Page presents to clinic today with cc of  painful, thick, discolored, elongated toenails 1-5 b/l that become tender and cannot cut because of thickness. Pain is aggravated when wearing enclosed shoe gear.  Redmond School, MD is her PCP. Last visit was January or February of this year per patient recall.   Medications reviewed.  No Known Allergies   Objective: Vitals:   08/06/18 1621  Temp: 97.9 F (36.6 C)    Physical Examination:  Vascular Examination: Capillary refill time immediate x 10 digits.  Palpable DP/PT pulses b/l.  Digital hair present b/l.  No edema noted b/l.  Skin temperature gradient WNL b/l.  No pain with calf compression b/l.   Dermatological Examination: Skin with normal turgor, texture and tone b/l.  No open wounds b/l.  No interdigital macerations noted b/l.  Elongated, thick, discolored brittle toenails with subungual debris and pain on dorsal palpation of nailbeds 1-5 b/l.  Porokeratotic lesions submet head 5 right foot with tenderness to palpation. No erythema, no edema, no drainage, no flocculence.  Macule noted plantar aspect of right foot measuring 0.5 x 1.2 cm. Ovoid in shape, black in color, no bleeding, no depth, no elevation, no asymmetry.  Musculoskeletal Examination: Muscle strength 5/5 to all muscle groups b/l  No pain, crepitus or joint discomfort with active/passive ROM.  Neurological Examination: Sensation intact 5/5 b/l with 10 gram monofilament.  Vibratory sensation intact b/l.  Proprioceptive sensation intact b/l.  Assessment: Mycotic nail infection with pain 1-5 b/l Porokeratosis submet head 5 right foot Macule right foot NIDDM  Plan: 1. Diabetic foot examination performed today. Continue diabetic foot care principles.  2. Toenails 1-5 b/l were debrided in length and girth without iatrogenic laceration. Rx written for Clotrimazole Cream to be applied to toenails b/l once daily.   3. Porokeratosis submet head 5 right foot pared and enucleated with sterile scalpel blade without incident. 4. Continue soft, supportive shoe gear daily. 5.  Report any pedal injuries to medical professional. 6. Follow up 10 weeks. 7. Patient/POA to call should there be a question/concern in there interim.

## 2018-08-11 NOTE — Patient Instructions (Signed)
April Page  08/11/2018     @PREFPERIOPPHARMACY @   Your procedure is scheduled on  08/20/2018 .  Report to [facility] at  800   A.M.  Call this number if you have problems the morning of surgery:  [phone]   Remember:             Follow the diet instructions given to you by Dr Roseanne Kaufman office.    Take these medicines the morning of surgery with A SIP OF WATER  Amlodipine, dasatinib.    Do not wear jewelry, make-up or nail polish.  Do not wear lotions, powders, or perfumes, or deodorant.  Do not shave 48 hours prior to surgery.  Men may shave face and neck.  Do not bring valuables to the hospital.  [facility] is not responsible for any belongings or valuables.  Contacts, dentures or bridgework may not be worn into surgery.  Leave your suitcase in the car.  After surgery it may be brought to your room.  For patients admitted to the hospital, discharge time will be determined by your treatment team.  Patients discharged the day of surgery will not be allowed to drive home.   Name and phone number of your driver:   family Special instructions:  None  Please read over the following fact sheets that you were given. Anesthesia Post-op Instructions and Care and Recovery After Surgery       Upper Endoscopy, Adult, Care After This sheet gives you information about how to care for yourself after your procedure. Your health care provider may also give you more specific instructions. If you have problems or questions, contact your health care provider. What can I expect after the procedure? After the procedure, it is common to have:  A sore throat.  Mild stomach pain or discomfort.  Bloating.  Nausea. Follow these instructions at home:   Follow instructions from your health care provider about what to eat or drink after your procedure.  Return to your normal activities as told by your health care provider. Ask your health care provider what activities are  safe for you.  Take over-the-counter and prescription medicines only as told by your health care provider.  Do not drive for 24 hours if you were given a sedative during your procedure.  Keep all follow-up visits as told by your health care provider. This is important. Contact a health care provider if you have:  A sore throat that lasts longer than one day.  Trouble swallowing. Get help right away if:  You vomit blood or your vomit looks like coffee grounds.  You have: ? A fever. ? Bloody, black, or tarry stools. ? A severe sore throat or you cannot swallow. ? Difficulty breathing. ? Severe pain in your chest or abdomen. Summary  After the procedure, it is common to have a sore throat, mild stomach discomfort, bloating, and nausea.  Do not drive for 24 hours if you were given a sedative during the procedure.  Follow instructions from your health care provider about what to eat or drink after your procedure.  Return to your normal activities as told by your health care provider. This information is not intended to replace advice given to you by your health care provider. Make sure you discuss any questions you have with your health care provider. Document Released: 08/27/2011 Document Revised: 07/28/2017 Document Reviewed: 07/28/2017 Elsevier Interactive Patient Education  2019 [employer]  Esophageal Dilatation  Esophageal dilatation, also called esophageal dilation, is a procedure to widen or open (dilate) a blocked or narrowed part of the esophagus. The esophagus is the part of the body that moves food and liquid from the mouth to the stomach. You may need this procedure if:  You have a buildup of scar tissue in your esophagus that makes it difficult, painful, or impossible to swallow. This can be caused by gastroesophageal reflux disease (GERD).  You have cancer of the esophagus.  There is a problem with how food moves through your esophagus. In some cases, you may  need this procedure repeated at a later time to dilate the esophagus gradually. Tell a health care provider about:  Any allergies you have.  All medicines you are taking, including vitamins, herbs, eye drops, creams, and over-the-counter medicines.  Any problems you or family members have had with anesthetic medicines.  Any blood disorders you have.  Any surgeries you have had.  Any medical conditions you have.  Any antibiotic medicines you are required to take before dental procedures.  Whether you are pregnant or may be pregnant. What are the risks? Generally, this is a safe procedure. However, problems may occur, including:  Bleeding due to a tear in the lining of the esophagus.  A hole (perforation) in the esophagus. What happens before the procedure?  Follow instructions from your health care provider about eating or drinking restrictions.  Ask your health care provider about changing or stopping your regular medicines. This is especially important if you are taking diabetes medicines or blood thinners.  Plan to have someone take you home from the hospital or clinic.  Plan to have a responsible adult care for you for at least 24 hours after you leave the hospital or clinic. This is important. What happens during the procedure?  You may be given a medicine to help you relax (sedative).  A numbing medicine may be sprayed into the back of your throat, or you may gargle the medicine.  Your health care provider may perform the dilatation using various surgical instruments, such as: ? Simple dilators. This instrument is carefully placed in the esophagus to stretch it. ? Guided wire bougies. This involves using an endoscope to insert a wire into the esophagus. A dilator is passed over this wire to enlarge the esophagus. Then the wire is removed. ? Balloon dilators. An endoscope with a small balloon at the end is inserted into the esophagus. The balloon is inflated to stretch  the esophagus and open it up. The procedure may vary among health care providers and hospitals. What happens after the procedure?  Your blood pressure, heart rate, breathing rate, and blood oxygen level will be monitored until the medicines you were given have worn off.  Your throat may feel slightly sore and numb. This will improve slowly over time.  You will not be allowed to eat or drink until your throat is no longer numb.  When you are able to drink, urinate, and sit on the edge of the bed without nausea or dizziness, you may be able to return home. Follow these instructions at home:  Take over-the-counter and prescription medicines only as told by your health care provider.  Do not drive for 24 hours if you were given a sedative during your procedure.  You should have a responsible adult with you for 24 hours after the procedure.  Follow instructions from your health care provider about any eating or drinking restrictions.  Do not use  any products that contain nicotine or tobacco, such as cigarettes and e-cigarettes. If you need help quitting, ask your health care provider.  Keep all follow-up visits as told by your health care provider. This is important. Get help right away if you:  Have a fever.  Have chest pain.  Have pain that is not relieved by medication.  Have trouble breathing.  Have trouble swallowing.  Vomit blood. Summary  Esophageal dilatation, also called esophageal dilation, is a procedure to widen or open (dilate) a blocked or narrowed part of the esophagus.  Plan to have someone take you home from the hospital or clinic.  For this procedure, a numbing medicine may be sprayed into the back of your throat, or you may gargle the medicine.  Do not drive for 24 hours if you were given a sedative during your procedure. This information is not intended to replace advice given to you by your health care provider. Make sure you discuss any questions you  have with your health care provider. Document Released: 04/18/2005 Document Revised: 12/31/2016 Document Reviewed: 12/31/2016 Elsevier Interactive Patient Education  2019 [place], Care After These instructions provide you with information about caring for yourself after your procedure. Your health care provider may also give you more specific instructions. Your treatment has been planned according to current medical practices, but problems sometimes occur. Call your health care provider if you have any problems or questions after your procedure. What can I expect after the procedure? After your procedure, you may:  Feel sleepy for several hours.  Feel clumsy and have poor balance for several hours.  Feel forgetful about what happened after the procedure.  Have poor judgment for several hours.  Feel nauseous or vomit.  Have a sore throat if you had a breathing tube during the procedure. Follow these instructions at home: For at least 24 hours after the procedure:      Have a responsible adult stay with you. It is important to have someone help care for you until you are awake and alert.  Rest as needed.  Do not: ? Participate in activities in which you could fall or become injured. ? Drive. ? Use heavy machinery. ? Drink alcohol. ? Take sleeping pills or medicines that cause drowsiness. ? Make important decisions or sign legal documents. ? Take care of children on your own. Eating and drinking  Follow the diet that is recommended by your health care provider.  If you vomit, drink water, juice, or soup when you can drink without vomiting.  Make sure you have little or no nausea before eating solid foods. General instructions  Take over-the-counter and prescription medicines only as told by your health care provider.  If you have sleep apnea, surgery and certain medicines can increase your risk for breathing problems. Follow instructions  from your health care provider about wearing your sleep device: ? Anytime you are sleeping, including during daytime naps. ? While taking prescription pain medicines, sleeping medicines, or medicines that make you drowsy.  If you smoke, do not smoke without supervision.  Keep all follow-up visits as told by your health care provider. This is important. Contact a health care provider if:  You keep feeling nauseous or you keep vomiting.  You feel light-headed.  You develop a rash.  You have a fever. Get help right away if:  You have trouble breathing. Summary  For several hours after your procedure, you may feel sleepy and have poor judgment.  Have a responsible adult stay with you for at least 24 hours or until you are awake and alert. This information is not intended to replace advice given to you by your health care provider. Make sure you discuss any questions you have with your health care provider. Document Released: 06/18/2015 Document Revised: 10/11/2016 Document Reviewed: 06/18/2015 Elsevier Interactive Patient Education  2019 [employer].

## 2018-08-13 MED FILL — SPRYCEL 100 MG TABLET: 100 | 30 days supply | Qty: 30 | Fill #3

## 2018-08-14 ENCOUNTER — Encounter (HOSPITAL_COMMUNITY): Payer: Self-pay

## 2018-08-14 ENCOUNTER — Other Ambulatory Visit (HOSPITAL_COMMUNITY)
Admission: RE | Admit: 2018-08-14 | Discharge: 2018-08-14 | Disposition: A | Discharge status: Home / Self Care | Payer: Medicare HMO | Source: Ambulatory Visit | Attending: Internal Medicine | Admitting: Internal Medicine

## 2018-08-14 ENCOUNTER — Other Ambulatory Visit: Payer: Self-pay

## 2018-08-14 ENCOUNTER — Encounter (HOSPITAL_COMMUNITY)
Admission: RE | Admit: 2018-08-14 | Discharge: 2018-08-14 | Disposition: A | Discharge status: Home / Self Care | Payer: Medicare HMO | Source: Ambulatory Visit | Attending: Internal Medicine | Admitting: Internal Medicine

## 2018-08-14 DIAGNOSIS — Z1159 Encounter for screening for other viral diseases: Secondary | ICD-10-CM | POA: Diagnosis not present

## 2018-08-14 DIAGNOSIS — Z01812 Encounter for preprocedural laboratory examination: Secondary | ICD-10-CM | POA: Diagnosis not present

## 2018-08-14 LAB — CBC WITH DIFFERENTIAL/PLATELET
Abs Immature Granulocytes: 0.01 10*3/uL (ref 0.00–0.07)
Basophils Absolute: 0 10*3/uL (ref 0.0–0.1)
Basophils Relative: 1 %
Eosinophils Absolute: 0 10*3/uL (ref 0.0–0.5)
Eosinophils Relative: 0 %
HCT: 31.4 % — ABNORMAL LOW (ref 36.0–46.0)
Hemoglobin: 9.6 g/dL — ABNORMAL LOW (ref 12.0–15.0)
Immature Granulocytes: 0 %
Lymphocytes Relative: 44 %
Lymphs Abs: 1.8 10*3/uL (ref 0.7–4.0)
MCH: 29.1 pg (ref 26.0–34.0)
MCHC: 30.6 g/dL (ref 30.0–36.0)
MCV: 95.2 fL (ref 80.0–100.0)
Monocytes Absolute: 0.5 10*3/uL (ref 0.1–1.0)
Monocytes Relative: 12 %
Neutro Abs: 1.8 10*3/uL (ref 1.7–7.7)
Neutrophils Relative %: 43 %
Platelets: 263 10*3/uL (ref 150–400)
RBC: 3.3 MIL/uL — ABNORMAL LOW (ref 3.87–5.11)
RDW: 17.9 % — ABNORMAL HIGH (ref 11.5–15.5)
WBC: 4.1 10*3/uL (ref 4.0–10.5)
nRBC: 0 % (ref 0.0–0.2)

## 2018-08-14 LAB — BASIC METABOLIC PANEL
Anion gap: 8 (ref 5–15)
BUN: 24 mg/dL — ABNORMAL HIGH (ref 8–23)
CO2: 25 mmol/L (ref 22–32)
Calcium: 8.9 mg/dL (ref 8.9–10.3)
Chloride: 108 mmol/L (ref 98–111)
Creatinine, Ser: 1.53 mg/dL — ABNORMAL HIGH (ref 0.44–1.00)
GFR calc Af Amer: 39 mL/min — ABNORMAL LOW (ref >60)
GFR calc non Af Amer: 33 mL/min — ABNORMAL LOW (ref >60)
Glucose, Bld: 103 mg/dL — ABNORMAL HIGH (ref 70–99)
Potassium: 5.1 mmol/L (ref 3.5–5.1)
Sodium: 141 mmol/L (ref 135–145)

## 2018-08-14 NOTE — Progress Notes (Signed)
Remote pacemaker transmission.   

## 2018-08-15 LAB — NOVEL CORONAVIRUS, NAA (HOSP ORDER, SEND-OUT TO REF LAB; TAT 18-24 HRS): SARS-CoV-2, NAA: NOT DETECTED

## 2018-08-19 ENCOUNTER — Other Ambulatory Visit (HOSPITAL_COMMUNITY): Payer: Self-pay | Admitting: Internal Medicine

## 2018-08-19 DIAGNOSIS — Z1231 Encounter for screening mammogram for malignant neoplasm of breast: Secondary | ICD-10-CM

## 2018-08-20 ENCOUNTER — Ambulatory Visit (HOSPITAL_COMMUNITY): Payer: Medicare HMO | Admitting: Anesthesiology

## 2018-08-20 ENCOUNTER — Encounter (HOSPITAL_COMMUNITY)
Admission: RE | Disposition: A | Discharge status: Home / Self Care | Payer: Self-pay | Source: Home / Self Care | Attending: Internal Medicine

## 2018-08-20 ENCOUNTER — Encounter (HOSPITAL_COMMUNITY): Payer: Self-pay | Admitting: Emergency Medicine

## 2018-08-20 ENCOUNTER — Ambulatory Visit (HOSPITAL_COMMUNITY)
Admission: RE | Admit: 2018-08-20 | Discharge: 2018-08-20 | Disposition: A | Discharge status: Home / Self Care | Payer: Medicare HMO | Attending: Internal Medicine | Admitting: Internal Medicine

## 2018-08-20 ENCOUNTER — Other Ambulatory Visit: Payer: Self-pay

## 2018-08-20 DIAGNOSIS — Z7982 Long term (current) use of aspirin: Secondary | ICD-10-CM | POA: Insufficient documentation

## 2018-08-20 DIAGNOSIS — E785 Hyperlipidemia, unspecified: Secondary | ICD-10-CM | POA: Diagnosis not present

## 2018-08-20 DIAGNOSIS — E119 Type 2 diabetes mellitus without complications: Secondary | ICD-10-CM | POA: Diagnosis not present

## 2018-08-20 DIAGNOSIS — K2950 Unspecified chronic gastritis without bleeding: Secondary | ICD-10-CM | POA: Diagnosis not present

## 2018-08-20 DIAGNOSIS — Z950 Presence of cardiac pacemaker: Secondary | ICD-10-CM | POA: Diagnosis not present

## 2018-08-20 DIAGNOSIS — Z856 Personal history of leukemia: Secondary | ICD-10-CM | POA: Insufficient documentation

## 2018-08-20 DIAGNOSIS — Z79899 Other long term (current) drug therapy: Secondary | ICD-10-CM | POA: Insufficient documentation

## 2018-08-20 DIAGNOSIS — R1319 Other dysphagia: Secondary | ICD-10-CM

## 2018-08-20 DIAGNOSIS — K449 Diaphragmatic hernia without obstruction or gangrene: Secondary | ICD-10-CM | POA: Diagnosis not present

## 2018-08-20 DIAGNOSIS — R1310 Dysphagia, unspecified: Secondary | ICD-10-CM | POA: Insufficient documentation

## 2018-08-20 DIAGNOSIS — I10 Essential (primary) hypertension: Secondary | ICD-10-CM | POA: Diagnosis not present

## 2018-08-20 DIAGNOSIS — I69328 Other speech and language deficits following cerebral infarction: Secondary | ICD-10-CM | POA: Insufficient documentation

## 2018-08-20 DIAGNOSIS — K3189 Other diseases of stomach and duodenum: Secondary | ICD-10-CM | POA: Diagnosis not present

## 2018-08-20 DIAGNOSIS — K2210 Ulcer of esophagus without bleeding: Secondary | ICD-10-CM | POA: Diagnosis not present

## 2018-08-20 DIAGNOSIS — K210 Gastro-esophageal reflux disease with esophagitis: Secondary | ICD-10-CM | POA: Diagnosis not present

## 2018-08-20 HISTORY — PX: BIOPSY: SHX5522

## 2018-08-20 HISTORY — PX: MALONEY DILATION: SHX5535

## 2018-08-20 HISTORY — PX: ESOPHAGOGASTRODUODENOSCOPY (EGD) WITH PROPOFOL: SHX5813

## 2018-08-20 SURGERY — ESOPHAGOGASTRODUODENOSCOPY (EGD) WITH PROPOFOL
Anesthesia: General

## 2018-08-20 MED ORDER — CHLORHEXIDINE GLUCONATE CLOTH 2 % EX PADS: 6.0000 | MEDICATED_PAD | Freq: Once | CUTANEOUS | Status: DC

## 2018-08-20 MED ORDER — LIDOCAINE HCL 1 % IJ SOLN
INTRAMUSCULAR | Status: DC | PRN
  Administered 2018-08-20: 40 mg via INTRADERMAL

## 2018-08-20 MED ORDER — PROPOFOL 500 MG/50ML IV EMUL
INTRAVENOUS | Status: DC | PRN
  Administered 2018-08-20: 75 ug/kg/min via INTRAVENOUS

## 2018-08-20 MED ORDER — KETAMINE HCL 10 MG/ML IJ SOLN
INTRAMUSCULAR | Status: DC | PRN
  Administered 2018-08-20: 10 mg via INTRAVENOUS

## 2018-08-20 MED ORDER — PROMETHAZINE HCL 25 MG/ML IJ SOLN: 6.2500 mg | INTRAMUSCULAR | Status: DC | PRN

## 2018-08-20 MED ORDER — PROPOFOL 10 MG/ML IV BOLUS
INTRAVENOUS | Status: AC
  Filled 2018-08-20: qty 40

## 2018-08-20 MED ORDER — PROPOFOL 10 MG/ML IV BOLUS
INTRAVENOUS | Status: DC | PRN
  Administered 2018-08-20: 10 mg via INTRAVENOUS

## 2018-08-20 MED ORDER — LACTATED RINGERS IV SOLN
INTRAVENOUS | Status: DC
  Administered 2018-08-20: 09:00:00 via INTRAVENOUS

## 2018-08-20 MED ORDER — GLYCOPYRROLATE 0.2 MG/ML IJ SOLN
INTRAMUSCULAR | Status: DC | PRN
  Administered 2018-08-20: 0.2 mg via INTRAVENOUS

## 2018-08-20 MED ORDER — HYDROMORPHONE HCL 1 MG/ML IJ SOLN: 0.2500 mg | INTRAMUSCULAR | Status: DC | PRN

## 2018-08-20 MED ORDER — KETAMINE HCL 50 MG/5ML IJ SOSY
PREFILLED_SYRINGE | INTRAMUSCULAR | Status: AC
  Filled 2018-08-20: qty 5

## 2018-08-20 MED ORDER — HYDROCODONE-ACETAMINOPHEN 7.5-325 MG PO TABS: 1.0000 | ORAL_TABLET | Freq: Once | ORAL | Status: DC | PRN

## 2018-08-20 MED ORDER — MIDAZOLAM HCL 2 MG/2ML IJ SOLN: 0.5000 mg | Freq: Once | INTRAMUSCULAR | Status: DC | PRN

## 2018-08-20 NOTE — Transfer of Care (Signed)
Immediate Anesthesia Transfer of Care Note  Patient: April Page  Procedure(s) Performed: ESOPHAGOGASTRODUODENOSCOPY (EGD) WITH PROPOFOL (N/A ) MALONEY DILATION (N/A ) BIOPSY  Patient Location: PACU  Anesthesia Type:General  Level of Consciousness: awake, oriented and patient cooperative  Airway & Oxygen Therapy: Patient Spontanous Breathing  Post-op Assessment: Report given to RN and Post -op Vital signs reviewed and stable  Post vital signs: Reviewed and stable  Last Vitals:  Vitals Value Taken Time  BP    Temp    Pulse 86 08/20/18 1015  Resp 17 08/20/18 1015  SpO2 93 % 08/20/18 1015  Vitals shown include unvalidated device data.  Last Pain:  Vitals:   08/20/18 0951  TempSrc:   PainSc: 0-No pain         Complications: No apparent anesthesia complications

## 2018-08-20 NOTE — Anesthesia Procedure Notes (Signed)
Procedure Name: Effort Performed by: Andree Elk Amy A, CRNA Pre-anesthesia Checklist: Patient identified, Emergency Drugs available, Suction available, Timeout performed and Patient being monitored Patient Re-evaluated:Patient Re-evaluated prior to induction Oxygen Delivery Method: Nasal Cannula

## 2018-08-20 NOTE — Op Note (Signed)
[facility] Patient Name: April Page Procedure Date: 08/20/2018 9:37 AM MRN: [id number] Date of Birth: [birth year] Attending MD: Norvel Richards , MD CSN: [id number] Age: 73 Admit Type: Outpatient Procedure:                Upper GI endoscopy Indications:              Dysphagia Providers:                Norvel Richards, MD, Jeanann Lewandowsky. Sharon Seller, RN,                            Aram Candela Referring MD:              Medicines:                Propofol per Anesthesia Complications:            No immediate complications. Estimated Blood Loss:     Estimated blood loss was minimal. Procedure:                Pre-Anesthesia Assessment:                           - Prior to the procedure, a History and Physical                            was performed, and patient medications and                            allergies were reviewed. The patient's tolerance of                            previous anesthesia was also reviewed. The risks                            and benefits of the procedure and the sedation                            options and risks were discussed with the patient.                            All questions were answered, and informed consent                            was obtained. Prior Anticoagulants: The patient has                            taken no previous anticoagulant or antiplatelet                            agents. ASA Grade Assessment: III - A patient with                            severe systemic disease. After reviewing the risks  and benefits, the patient was deemed in                            satisfactory condition to undergo the procedure.                           After obtaining informed consent, the endoscope was                            passed under direct vision. Throughout the                            procedure, the patient's blood pressure, pulse, and                            oxygen saturations were  monitored continuously. The                            GIF-H190 ([id number]) was introduced through the                            mouth, and advanced to the second part of duodenum.                            The upper GI endoscopy was accomplished without                            difficulty. The patient tolerated the procedure                            well. Scope In: 9:58:25 AM Scope Out: 10:05:42 AM Total Procedure Duration: 0 hours 7 minutes 17 seconds  Findings:      Circumferential distal esophageal erosions within 5 mm the GE junction.       Mild narrowing at this level. No definite stricture or ring. There is       epithelium seen no tumor seen. Small hiatal hernia. Nodular eroded to       antral mucosa without ulcer or infiltrating process observed. Patent       pylorus.      The duodenal bulb and second portion of the duodenum were normal. Scope       was withdrawn, a 54 [place] Maloney dilator was passed with mild       resistance. Look back revealed a small tear through the UES; otherwise       no apparent complications related to passage of the dilator. Estimated       blood loss minimal. Finally, biopsies of the abnormal antrum taken. Impression:               -Erosive reflux esophagitis. Status post April Page                            dilation                           -Hiatal Hernia; gastric erosions of uncertain  significance?"status post biopsy Moderate Sedation:      Moderate (conscious) sedation was personally administered by an       anesthesia professional. The following parameters were monitored: oxygen       saturation, heart rate, blood pressure, respiratory rate, EKG, adequacy       of pulmonary ventilation, and response to care. Recommendation:           - Patient has a contact number available for                            emergencies. The signs and symptoms of potential                            delayed complications were  discussed with the                            patient. Return to normal activities tomorrow.                            Written discharge instructions were provided to the                            patient. Begin Protonix 40 mg daily. Follow-up on                            pathology. Office visit with [place] in 6 months Procedure Code(s):        --- Professional ---                           [phone], Esophagogastroduodenoscopy, flexible,                            transoral; diagnostic, including collection of                            specimen(s) by brushing or washing, when performed                            (separate procedure) Diagnosis Code(s):        --- Professional ---                           R13.10, Dysphagia, unspecified CPT copyright 2019 American Medical Association. All rights reserved. The codes documented in this report are preliminary and upon coder review may  be revised to meet current compliance requirements. Cristopher Estimable. Antolin Belsito, MD Norvel Richards, MD 08/20/2018 10:23:20 AM This report has been signed electronically. Number of Addenda: 0

## 2018-08-20 NOTE — Anesthesia Preprocedure Evaluation (Signed)
Anesthesia Evaluation  Patient identified by MRN, date of birth, ID band Patient awake    Reviewed: Allergy & Precautions, NPO status , Patient's Chart, lab work & pertinent test results  Airway Mallampati: III  TM Distance: >3 FB Neck ROM: Full    Dental no notable dental hx. (+) Edentulous Upper, Edentulous Lower   Pulmonary neg pulmonary ROS,    Pulmonary exam normal breath sounds clear to auscultation       Cardiovascular Exercise Tolerance: Good hypertension, Pt. 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 daughter   Anesthesia Other Findings   Reproductive/Obstetrics negative OB ROS                             Anesthesia Physical Anesthesia Plan  ASA: III  Anesthesia Plan: General   Post-op Pain Management:    Induction: Intravenous  PONV Risk Score and Plan: 3 and Treatment may vary due to age or medical condition  Airway Management Planned: Nasal Cannula and Simple Face Mask  Additional Equipment:   Intra-op Plan:   Post-operative Plan: Extubation in OR  Informed Consent: I have reviewed the patients History and Physical, chart, labs and discussed the procedure including the risks, benefits and alternatives for the proposed anesthesia with the patient or authorized representative who has indicated his/her  understanding and acceptance.     Dental advisory given  Plan Discussed with: CRNA  Anesthesia Plan Comments: (Plan Full PPE use  Plan GA with GETA back up -d/w daughter and Pt-WTP with same  If plans on Cautery use will need to place magnet )        Anesthesia Quick Evaluation

## 2018-08-20 NOTE — H&P (Signed)
@LOGO @   Primary Care Physician:  Redmond School, MD Primary Gastroenterologist:  Dr. Gala Romney  Pre-Procedure History & Physical: HPI:  April Page is a 73 y.o. female here for further evaluation of dysphagia via EGD.  Preop prior history of dysphagia.  EGD demonstrated normal esophagus responded nicely to [facility] dilation.  Patient has a pacemaker but no defibrillator.  Discussed discussed with anesthesia (Amy).  No intervention, magnet, etc. anticipated.  Past Medical History:  Diagnosis Date  . Arthritis    stiff knees  . Blood dyscrasia   . Cancer ([place])   . CVA (cerebral vascular accident) ([place])   . GERD (gastroesophageal reflux disease)   . H/O: CML (chronic myeloid leukemia)   . Hyperlipidemia   . Hyperlipidemia   . Hypertension   . Iron deficiency anemia 07/15/2018  . Obesity   . Pessary maintenance 07/01/2013  . Stroke ([place])    x 3    Past Surgical History:  Procedure Laterality Date  . ABDOMINAL HYSTERECTOMY    . CHOLECYSTECTOMY    . COLONOSCOPY  06/09/2002   PPI:RJJOACZY hemorrhoids; otherwise normal rectum, colon   . COLONOSCOPY N/A 08/25/2013   SAY:TKZSWFU diverticulosis. Single colonic polyp-removed  s/p segmental biopsy and stool sample. random colon bx negative. +benign leiomyoma.  . cystocele/rectocele repair  2009  . ESOPHAGOGASTRODUODENOSCOPY  06/09/2002   XNA:TFTDDU upper gastrointestinal tract s/p  54-French Maloney dilator  . FEMUR IM NAIL Right 04/12/2016   Procedure: INTRAMEDULLARY (IM) NAIL FEMORAL;  Surgeon: Nicholes Stairs, MD;  Location: [place];  Service: Orthopedics;  Laterality: Right;  . HERNIA REPAIR    . IR THORACENTESIS ASP PLEURAL SPACE W/IMG GUIDE  05/07/2018  . LOOP RECORDER INSERTION N/A 08/23/2016   Procedure: Loop Recorder Insertion;  Surgeon: Constance Haw, MD;  Location: [place] [facility];  Service: Cardiovascular;  Laterality: N/A;  . LOOP RECORDER REMOVAL N/A 05/07/2018   Procedure: LOOP RECORDER REMOVAL;  Surgeon: Constance Haw, MD;  Location: [place] [facility];  Service: Cardiovascular;  Laterality: N/A;  . OOPHORECTOMY    . PACEMAKER IMPLANT N/A 05/07/2018   Procedure: PACEMAKER IMPLANT;  Surgeon: Constance Haw, MD;  Location: [place] [facility];  Service: Cardiovascular;  Laterality: N/A;  . TEE WITHOUT CARDIOVERSION N/A 08/23/2016   Procedure: TRANSESOPHAGEAL ECHOCARDIOGRAM (TEE);  Surgeon: Fay Records, MD;  Location: [place];  Service: Cardiovascular;  Laterality: N/A;    Prior to Admission medications   Medication Sig Start Date End Date Taking? Authorizing Provider  amLODipine (NORVASC) 10 MG tablet Take 1 tablet (10 mg total) by mouth daily. 05/09/18  Yes Elgergawy, Silver Huguenin, MD  aspirin 325 MG tablet Take 1 tablet (325 mg total) by mouth daily. 06/05/16  Yes Kathie Dike, MD  atorvastatin (LIPITOR) 40 MG tablet Take 1 tablet (40 mg total) by mouth daily at 6 PM. 08/18/16  Yes Samuella Cota, MD  clotrimazole (LOTRIMIN) 1 % cream Apply to both feet and between toes twice daily for 4 weeks. 08/06/18  Yes Marzetta Board, DPM  dasatinib (SPRYCEL) 100 MG tablet Take 1 tablet (100 mg total) by mouth daily. 07/15/18  Yes Lockamy, Randi L, NP-C  feeding supplement, ENSURE ENLIVE, (ENSURE ENLIVE) LIQD Take 237 mLs by mouth 2 (two) times daily between meals. 05/08/18  Yes Elgergawy, Silver Huguenin, MD  hydrALAZINE (APRESOLINE) 25 MG tablet Take 1 tablet (25 mg total) by mouth 2 (two) times daily. 05/08/18 05/08/19 Yes Elgergawy, Silver Huguenin, MD  losartan (COZAAR) 100  MG tablet Take 1 tablet (100 mg total) by mouth daily. Resume on 3/28 Patient taking differently: Take 100 mg by mouth daily.  08/18/16  Yes Samuella Cota, MD  Multiple Vitamin (MULTIVITAMIN WITH MINERALS) TABS tablet Take 1 tablet by mouth daily.   Yes [provider]  Nutritional Supplements (BOOST PO) Take by mouth 2 (two) times daily.   Yes [provider]  polyethylene glycol (MIRALAX / GLYCOLAX) packet Take 17  g by mouth daily. Patient taking differently: Take 17 g by mouth every other day.  06/06/16  Yes Tat, Shanon Brow, MD  sennosides-docusate sodium (SENOKOT-S) 8.6-50 MG tablet Take 1 tablet by mouth every evening.   Yes [provider]  acetaminophen (TYLENOL) 325 MG tablet Take 2 tablets (650 mg total) by mouth every 6 (six) hours as needed for mild pain (or temp > 37.5 C (99.5 F)). 05/08/18   Elgergawy, Silver Huguenin, MD    Allergies as of 07/03/2018  . (No Known Allergies)    Family History  Problem Relation Age of Onset  . Anuerysm Mother        deceased age 48, brain anuerysm  . Early death Mother 73  . Heart disease Father   . Hypertension Sister   . Obesity Brother   . Hypertension Sister   . Arthritis Sister   . Other Son        cardiac arrest  . Heart disease Child 35       cardiac arrest  . Colon cancer Neg Hx     Social History   Socioeconomic History  . Marital status: Widowed    Spouse name: Not on file  . Number of children: 6  . Years of education: associate  . Highest education level: Not on file  Occupational History    Employer: RETIRED  Social Needs  . Financial resource strain: Not on file  . Food insecurity    Worry: Not on file    Inability: Not on file  . Transportation needs    Medical: Not on file    Non-medical: Not on file  Tobacco Use  . Smoking status: Never Smoker  . Smokeless tobacco: Never Used  Substance and Sexual Activity  . Alcohol use: No  . Drug use: No  . Sexual activity: Not Currently    Birth control/protection: Surgical    Comment: hyst  Lifestyle  . Physical activity    Days per week: Not on file    Minutes per session: Not on file  . Stress: Not on file  Relationships  . Social Herbalist on phone: Not on file    Gets together: Not on file    Attends religious service: Not on file    Active member of club or organization: Not on file    Attends meetings of clubs or organizations: Not on file     Relationship status: Not on file  . Intimate partner violence    Fear of current or ex partner: Not on file    Emotionally abused: Not on file    Physically abused: Not on file    Forced sexual activity: Not on file  Other Topics Concern  . Not on file  Social History Narrative  . Not on file    Review of Systems: See HPI, otherwise negative ROS  Physical Exam: BP (!) 116/57   Pulse 67   Temp 97.9 F (36.6 C) (Oral)   Resp 17   SpO2 95%  General:   Alert,  Well-developed, well-nourished, pleasant and cooperative in NAD SNeck:  Supple; no masses or thyromegaly. No significant cervical adenopathy. Lungs:  Clear throughout to auscultation.   No wheezes, crackles, or rhonchi. No acute distress. Heart:  Regular rate and rhythm; no murmurs, clicks, rubs,  or gallops. Abdomen: Non-distended, normal bowel sounds.  Soft and nontender without appreciable mass or hepatosplenomegaly.  Pulses:  Normal pulses noted. Extremities:  Without clubbing or edema.  Impression/Plan: Pleasant 34-year-old lady with recurrent esophageal dysphagia.  May well have an underlying esophageal motility disorder.  She responded nicely to bougie dilation previously.  I have Offered the patient a repeat EGD with dilation as feasible/appropriate per plan.  The risks, benefits, limitations, alternatives and imponderables have been reviewed with the patient. Potential for esophageal dilation, biopsy, etc. have also been reviewed.  Questions have been answered. All parties agreeable.     Notice: This dictation was prepared with Dragon dictation along with smaller phrase technology. Any transcriptional errors that result from this process are unintentional and may not be corrected upon review.

## 2018-08-20 NOTE — Anesthesia Postprocedure Evaluation (Signed)
Anesthesia Post Note  Patient: April Page  Procedure(s) Performed: ESOPHAGOGASTRODUODENOSCOPY (EGD) WITH PROPOFOL (N/A ) MALONEY DILATION (N/A ) BIOPSY  Patient location during evaluation: PACU Anesthesia Type: General Level of consciousness: awake and alert and oriented Pain management: pain level controlled Vital Signs Assessment: post-procedure vital signs reviewed and stable Respiratory status: spontaneous breathing Cardiovascular status: stable Postop Assessment: no apparent nausea or vomiting Anesthetic complications: no     Last Vitals:  Vitals:   08/20/18 0849  BP: (!) 116/57  Pulse: 67  Resp: 17  Temp: 36.6 C  SpO2: 95%    Last Pain:  Vitals:   08/20/18 0951  TempSrc:   PainSc: 0-No pain                 ADAMS, AMY A

## 2018-08-20 NOTE — Addendum Note (Signed)
Addendum  created 08/20/18 1349 by Ollen Bowl, CRNA   Intraprocedure Event edited

## 2018-08-20 NOTE — Discharge Instructions (Signed)
EGD Discharge instructions Please read the instructions outlined below and refer to this sheet in the next few weeks. These discharge instructions provide you with general information on caring for yourself after you leave the hospital. Your doctor may also give you specific instructions. While your treatment has been planned according to the most current medical practices available, unavoidable complications occasionally occur. If you have any problems or questions after discharge, please call your doctor. ACTIVITY  You may resume your regular activity but move at a slower pace for the next 24 hours.   Take frequent rest periods for the next 24 hours.   Walking will help expel (get rid of) the air and reduce the bloated feeling in your abdomen.   No driving for 24 hours (because of the anesthesia (medicine) used during the test).   You may shower.   Do not sign any important legal documents or operate any machinery for 24 hours (because of the anesthesia used during the test).  NUTRITION  Drink plenty of fluids.   You may resume your normal diet.   Begin with a light meal and progress to your normal diet.   Avoid alcoholic beverages for 24 hours or as instructed by your caregiver.  MEDICATIONS  You may resume your normal medications unless your caregiver tells you otherwise.  WHAT YOU CAN EXPECT TODAY  You may experience abdominal discomfort such as a feeling of fullness or gas pains.  FOLLOW-UP  Your doctor will discuss the results of your test with you.  SEEK IMMEDIATE MEDICAL ATTENTION IF ANY OF THE FOLLOWING OCCUR:  Excessive nausea (feeling sick to your stomach) and/or vomiting.   Severe abdominal pain and distention (swelling).   Trouble swallowing.   Temperature over 101 F (37.8 C).   Rectal bleeding or vomiting of blood.   GERD information provided  Begin Protonix 40 mg daily.  Further recommendations to follow pending review of pathology  report  Office visit with [place] in 6 months  Spoke to, Azure, at patient's request regarding impression and recommendations.  [phone]    Gastroesophageal Reflux Disease, Adult Gastroesophageal reflux (GER) happens when acid from the stomach flows up into the tube that connects the mouth and the stomach (esophagus). Normally, food travels down the esophagus and stays in the stomach to be digested. With GER, food and stomach acid sometimes move back up into the esophagus. You may have a disease called gastroesophageal reflux disease (GERD) if the reflux:  Happens often.  Causes frequent or very bad symptoms.  Causes problems such as damage to the esophagus. When this happens, the esophagus becomes sore and swollen (inflamed). Over time, GERD can make small holes (ulcers) in the lining of the esophagus. What are the causes? This condition is caused by a problem with the muscle between the esophagus and the stomach. When this muscle is weak or not normal, it does not close properly to keep food and acid from coming back up from the stomach. The muscle can be weak because of:  Tobacco use.  Pregnancy.  Having a certain type of hernia (hiatal hernia).  Alcohol use.  Certain foods and drinks, such as coffee, chocolate, onions, and peppermint. What increases the risk? You are more likely to develop this condition if you:  Are overweight.  Have a disease that affects your connective tissue.  Use NSAID medicines. What are the signs or symptoms? Symptoms of this condition include:  Heartburn.  Difficult or painful swallowing.  The feeling of having a  lump in the throat.  A bitter taste in the mouth.  Bad breath.  Having a lot of saliva.  Having an upset or bloated stomach.  Belching.  Chest pain. Different conditions can cause chest pain. Make sure you see your doctor if you have chest pain.  Shortness of breath or noisy breathing (wheezing).  Ongoing (chronic)  cough or a cough at night.  Wearing away of the surface of teeth (tooth enamel).  Weight loss. How is this treated? Treatment will depend on how bad your symptoms are. Your doctor may suggest:  Changes to your diet.  Medicine.  Surgery. Follow these instructions at home: Eating and drinking   Follow a diet as told by your doctor. You may need to avoid foods and drinks such as: ? Coffee and tea (with or without caffeine). ? Drinks that contain alcohol. ? Energy drinks and sports drinks. ? Bubbly (carbonated) drinks or sodas. ? Chocolate and cocoa. ? Peppermint and mint flavorings. ? Garlic and onions. ? Horseradish. ? Spicy and acidic foods. These include peppers, chili powder, curry powder, vinegar, hot sauces, and BBQ sauce. ? Citrus fruit juices and citrus fruits, such as oranges, lemons, and limes. ? Tomato-based foods. These include red sauce, chili, salsa, and pizza with red sauce. ? Fried and fatty foods. These include donuts, french fries, potato chips, and high-fat dressings. ? High-fat meats. These include hot dogs, rib eye steak, sausage, ham, and bacon. ? High-fat dairy items, such as whole milk, butter, and cream cheese.  Eat small meals often. Avoid eating large meals.  Avoid drinking large amounts of liquid with your meals.  Avoid eating meals during the 2-3 hours before bedtime.  Avoid lying down right after you eat.  Do not exercise right after you eat. Lifestyle   Do not use any products that contain nicotine or tobacco. These include cigarettes, e-cigarettes, and chewing tobacco. If you need help quitting, ask your doctor.  Try to lower your stress. If you need help doing this, ask your doctor.  If you are overweight, lose an amount of weight that is healthy for you. Ask your doctor about a safe weight loss goal. General instructions  Pay attention to any changes in your symptoms.  Take over-the-counter and prescription medicines only as told by  your doctor. Do not take aspirin, ibuprofen, or other NSAIDs unless your doctor says it is okay.  Wear loose clothes. Do not wear anything tight around your waist.  Raise (elevate) the head of your bed about 6 inches (15 cm).  Avoid bending over if this makes your symptoms worse.  Keep all follow-up visits as told by your doctor. This is important. Contact a doctor if:  You have new symptoms.  You lose weight and you do not know why.  You have trouble swallowing or it hurts to swallow.  You have wheezing or a cough that keeps happening.  Your symptoms do not get better with treatment.  You have a hoarse voice. Get help right away if:  You have pain in your arms, neck, jaw, teeth, or back.  You feel sweaty, dizzy, or light-headed.  You have chest pain or shortness of breath.  You throw up (vomit) and your throw-up looks like blood or coffee grounds.  You pass out (faint).  Your poop (stool) is bloody or black.  You cannot swallow, drink, or eat. Summary  If a person has gastroesophageal reflux disease (GERD), food and stomach acid move back up into the esophagus  and cause symptoms or problems such as damage to the esophagus.  Treatment will depend on how bad your symptoms are.  Follow a diet as told by your doctor.  Take all medicines only as told by your doctor. This information is not intended to replace advice given to you by your health care provider. Make sure you discuss any questions you have with your health care provider. Document Released: 08/14/2007 Document Revised: 09/03/2017 Document Reviewed: 09/03/2017 Elsevier Interactive Patient Education  2019 [place], Care After These instructions provide you with information about caring for yourself after your procedure. Your health care provider may also give you more specific instructions. Your treatment has been planned according to current medical practices, but  problems sometimes occur. Call your health care provider if you have any problems or questions after your procedure. What can I expect after the procedure? After your procedure, you may:  Feel sleepy for several hours.  Feel clumsy and have poor balance for several hours.  Feel forgetful about what happened after the procedure.  Have poor judgment for several hours.  Feel nauseous or vomit.  Have a sore throat if you had a breathing tube during the procedure. Follow these instructions at home: For at least 24 hours after the procedure:      Have a responsible adult stay with you. It is important to have someone help care for you until you are awake and alert.  Rest as needed.  Do not: ? Participate in activities in which you could fall or become injured. ? Drive. ? Use heavy machinery. ? Drink alcohol. ? Take sleeping pills or medicines that cause drowsiness. ? Make important decisions or sign legal documents. ? Take care of children on your own. Eating and drinking  Follow the diet that is recommended by your health care provider.  If you vomit, drink water, juice, or soup when you can drink without vomiting.  Make sure you have little or no nausea before eating solid foods. General instructions  Take over-the-counter and prescription medicines only as told by your health care provider.  If you have sleep apnea, surgery and certain medicines can increase your risk for breathing problems. Follow instructions from your health care provider about wearing your sleep device: ? Anytime you are sleeping, including during daytime naps. ? While taking prescription pain medicines, sleeping medicines, or medicines that make you drowsy.  If you smoke, do not smoke without supervision.  Keep all follow-up visits as told by your health care provider. This is important. Contact a health care provider if:  You keep feeling nauseous or you keep vomiting.  You feel  light-headed.  You develop a rash.  You have a fever. Get help right away if:  You have trouble breathing. Summary  For several hours after your procedure, you may feel sleepy and have poor judgment.  Have a responsible adult stay with you for at least 24 hours or until you are awake and alert. This information is not intended to replace advice given to you by your health care provider. Make sure you discuss any questions you have with your health care provider. Document Released: 06/18/2015 Document Revised: 10/11/2016 Document Reviewed: 06/18/2015 Elsevier Interactive Patient Education  2019 [employer].

## 2018-08-26 ENCOUNTER — Encounter (HOSPITAL_COMMUNITY): Payer: Self-pay | Admitting: Internal Medicine

## 2018-08-30 ENCOUNTER — Encounter: Payer: Self-pay | Admitting: Internal Medicine

## 2018-09-14 MED FILL — SPRYCEL 100 MG TABLET: 100 | 30 days supply | Qty: 30 | Fill #4

## 2018-10-14 ENCOUNTER — Other Ambulatory Visit: Payer: Self-pay

## 2018-10-14 ENCOUNTER — Inpatient Hospital Stay (HOSPITAL_COMMUNITY): Payer: Medicare HMO | Attending: Hematology

## 2018-10-14 DIAGNOSIS — Z7982 Long term (current) use of aspirin: Secondary | ICD-10-CM | POA: Diagnosis not present

## 2018-10-14 DIAGNOSIS — Z950 Presence of cardiac pacemaker: Secondary | ICD-10-CM | POA: Diagnosis not present

## 2018-10-14 DIAGNOSIS — Z79899 Other long term (current) drug therapy: Secondary | ICD-10-CM | POA: Diagnosis not present

## 2018-10-14 DIAGNOSIS — R439 Unspecified disturbances of smell and taste: Secondary | ICD-10-CM | POA: Insufficient documentation

## 2018-10-14 DIAGNOSIS — C9210 Chronic myeloid leukemia, BCR/ABL-positive, not having achieved remission: Secondary | ICD-10-CM | POA: Insufficient documentation

## 2018-10-14 DIAGNOSIS — E785 Hyperlipidemia, unspecified: Secondary | ICD-10-CM | POA: Diagnosis not present

## 2018-10-14 DIAGNOSIS — R001 Bradycardia, unspecified: Secondary | ICD-10-CM | POA: Diagnosis not present

## 2018-10-14 DIAGNOSIS — Z9221 Personal history of antineoplastic chemotherapy: Secondary | ICD-10-CM | POA: Diagnosis not present

## 2018-10-14 DIAGNOSIS — R5383 Other fatigue: Secondary | ICD-10-CM | POA: Diagnosis not present

## 2018-10-14 DIAGNOSIS — M1990 Unspecified osteoarthritis, unspecified site: Secondary | ICD-10-CM | POA: Diagnosis not present

## 2018-10-14 DIAGNOSIS — Z8673 Personal history of transient ischemic attack (TIA), and cerebral infarction without residual deficits: Secondary | ICD-10-CM | POA: Insufficient documentation

## 2018-10-14 DIAGNOSIS — K219 Gastro-esophageal reflux disease without esophagitis: Secondary | ICD-10-CM | POA: Insufficient documentation

## 2018-10-14 DIAGNOSIS — I10 Essential (primary) hypertension: Secondary | ICD-10-CM | POA: Insufficient documentation

## 2018-10-14 DIAGNOSIS — R634 Abnormal weight loss: Secondary | ICD-10-CM | POA: Insufficient documentation

## 2018-10-14 LAB — COMPREHENSIVE METABOLIC PANEL
ALT: 44 U/L (ref 0–44)
AST: 49 U/L — ABNORMAL HIGH (ref 15–41)
Albumin: 3.5 g/dL (ref 3.5–5.0)
Alkaline Phosphatase: 158 U/L — ABNORMAL HIGH (ref 38–126)
Anion gap: 11 (ref 5–15)
BUN: 27 mg/dL — ABNORMAL HIGH (ref 8–23)
CO2: 23 mmol/L (ref 22–32)
Calcium: 9.1 mg/dL (ref 8.9–10.3)
Chloride: 107 mmol/L (ref 98–111)
Creatinine, Ser: 1.47 mg/dL — ABNORMAL HIGH (ref 0.44–1.00)
GFR calc Af Amer: 41 mL/min — ABNORMAL LOW (ref >60)
GFR calc non Af Amer: 35 mL/min — ABNORMAL LOW (ref >60)
Glucose, Bld: 94 mg/dL (ref 70–99)
Potassium: 5 mmol/L (ref 3.5–5.1)
Sodium: 141 mmol/L (ref 135–145)
Total Bilirubin: 0.4 mg/dL (ref 0.3–1.2)
Total Protein: 7.5 g/dL (ref 6.5–8.1)

## 2018-10-14 LAB — CBC WITH DIFFERENTIAL/PLATELET
Abs Immature Granulocytes: 0.01 10*3/uL (ref 0.00–0.07)
Basophils Absolute: 0 10*3/uL (ref 0.0–0.1)
Basophils Relative: 1 %
Eosinophils Absolute: 0 10*3/uL (ref 0.0–0.5)
Eosinophils Relative: 1 %
HCT: 34.1 % — ABNORMAL LOW (ref 36.0–46.0)
Hemoglobin: 10.4 g/dL — ABNORMAL LOW (ref 12.0–15.0)
Immature Granulocytes: 0 %
Lymphocytes Relative: 47 %
Lymphs Abs: 2 10*3/uL (ref 0.7–4.0)
MCH: 29.1 pg (ref 26.0–34.0)
MCHC: 30.5 g/dL (ref 30.0–36.0)
MCV: 95.5 fL (ref 80.0–100.0)
Monocytes Absolute: 0.5 10*3/uL (ref 0.1–1.0)
Monocytes Relative: 12 %
Neutro Abs: 1.6 10*3/uL — ABNORMAL LOW (ref 1.7–7.7)
Neutrophils Relative %: 39 %
Platelets: 235 10*3/uL (ref 150–400)
RBC: 3.57 MIL/uL — ABNORMAL LOW (ref 3.87–5.11)
RDW: 14.4 % (ref 11.5–15.5)
WBC: 4.2 10*3/uL (ref 4.0–10.5)
nRBC: 0 % (ref 0.0–0.2)

## 2018-10-14 LAB — IRON AND TIBC
Iron: 31 ug/dL (ref 28–170)
Saturation Ratios: 11 % (ref 10.4–31.8)
TIBC: 278 ug/dL (ref 250–450)
UIBC: 247 ug/dL

## 2018-10-14 LAB — VITAMIN B12: Vitamin B-12: 854 pg/mL (ref 180–914)

## 2018-10-14 LAB — LACTATE DEHYDROGENASE: LDH: 287 U/L — ABNORMAL HIGH (ref 98–192)

## 2018-10-14 LAB — FOLATE: Folate: 29.3 ng/mL (ref >5.9)

## 2018-10-14 LAB — FERRITIN: Ferritin: 525 ng/mL — ABNORMAL HIGH (ref 11–307)

## 2018-10-15 LAB — VITAMIN D 25 HYDROXY (VIT D DEFICIENCY, FRACTURES): Vit D, 25-Hydroxy: 36 ng/mL (ref 30.0–100.0)

## 2018-10-15 MED FILL — SPRYCEL 100 MG TABLET: 100 | 30 days supply | Qty: 30 | Fill #5

## 2018-10-19 ENCOUNTER — Other Ambulatory Visit: Payer: Self-pay

## 2018-10-19 ENCOUNTER — Ambulatory Visit (INDEPENDENT_AMBULATORY_CARE_PROVIDER_SITE_OTHER): Payer: Medicare HMO | Admitting: Podiatry

## 2018-10-19 ENCOUNTER — Encounter: Payer: Self-pay | Admitting: Podiatry

## 2018-10-19 ENCOUNTER — Ambulatory Visit: Payer: Medicare HMO | Admitting: Podiatry

## 2018-10-19 DIAGNOSIS — B351 Tinea unguium: Secondary | ICD-10-CM

## 2018-10-19 DIAGNOSIS — E119 Type 2 diabetes mellitus without complications: Secondary | ICD-10-CM

## 2018-10-19 DIAGNOSIS — Q828 Other specified congenital malformations of skin: Secondary | ICD-10-CM

## 2018-10-19 NOTE — Patient Instructions (Signed)
Corns and Calluses Corns are small areas of thickened skin that occur on the top, sides, or tip of a toe. They contain a cone-shaped core with a point that can press on a nerve below. This causes pain.  Calluses are areas of thickened skin that can occur anywhere on the body, including the hands, fingers, palms, soles of the feet, and heels. Calluses are usually larger than corns. What are the causes? Corns and calluses are caused by rubbing (friction) or pressure, such as from shoes that are too tight or do not fit properly. What increases the risk? Corns are more likely to develop in people who have misshapen toes (toe deformities), such as hammer toes. Calluses can occur with friction to any area of the skin. They are more likely to develop in people who:  Work with their hands.  Wear shoes that fit poorly, are too tight, or are high-heeled.  Have toe deformities. What are the signs or symptoms? Symptoms of a corn or callus include:  A hard growth on the skin.  Pain or tenderness under the skin.  Redness and swelling.  Increased discomfort while wearing tight-fitting shoes, if your feet are affected. If a corn or callus becomes infected, symptoms may include:  Redness and swelling that gets worse.  Pain.  Fluid, blood, or pus draining from the corn or callus. How is this diagnosed? Corns and calluses may be diagnosed based on your symptoms, your medical history, and a physical exam. How is this treated? Treatment for corns and calluses may include:  Removing the cause of the friction or pressure. This may involve: ? Changing your shoes. ? Wearing shoe inserts (orthotics) or other protective layers in your shoes, such as a corn pad. ? Wearing gloves.  Applying medicine to the skin (topical medicine) to help soften skin in the hardened, thickened areas.  Removing layers of dead skin with a file to reduce the size of the corn or callus.  Removing the corn or callus with a  scalpel or laser.  Taking antibiotic medicines, if your corn or callus is infected.  Having surgery, if a toe deformity is the cause. Follow these instructions at home:   Take over-the-counter and prescription medicines only as told by your health care provider.  If you were prescribed an antibiotic, take it as told by your health care provider. Do not stop taking it even if your condition starts to improve.  Wear shoes that fit well. Avoid wearing high-heeled shoes and shoes that are too tight or too loose.  Wear any padding, protective layers, gloves, or orthotics as told by your health care provider.  Soak your hands or feet and then use a file or pumice stone to soften your corn or callus. Do this as told by your health care provider.  Check your corn or callus every day for symptoms of infection. Contact a health care provider if you:  Notice that your symptoms do not improve with treatment.  Have redness or swelling that gets worse.  Notice that your corn or callus becomes painful.  Have fluid, blood, or pus coming from your corn or callus.  Have new symptoms. Summary  Corns are small areas of thickened skin that occur on the top, sides, or tip of a toe.  Calluses are areas of thickened skin that can occur anywhere on the body, including the hands, fingers, palms, and soles of the feet. Calluses are usually larger than corns.  Corns and calluses are caused by   rubbing (friction) or pressure, such as from shoes that are too tight or do not fit properly.  Treatment may include wearing any padding, protective layers, gloves, or orthotics as told by your health care provider. This information is not intended to replace advice given to you by your health care provider. Make sure you discuss any questions you have with your health care provider. Document Released: 12/02/2003 Document Revised: 06/17/2018 Document Reviewed: 01/08/2017 Elsevier Patient Education  2020 Elsevier  Inc.   Onychomycosis/Fungal Toenails  WHAT IS IT? An infection that lies within the keratin of your nail plate that is caused by a fungus.  WHY ME? Fungal infections affect all ages, sexes, races, and creeds.  There may be many factors that predispose you to a fungal infection such as age, coexisting medical conditions such as diabetes, or an autoimmune disease; stress, medications, fatigue, genetics, etc.  Bottom line: fungus thrives in a warm, moist environment and your shoes offer such a location.  IS IT CONTAGIOUS? Theoretically, yes.  You do not want to share shoes, nail clippers or files with someone who has fungal toenails.  Walking around barefoot in the same room or sleeping in the same bed is unlikely to transfer the organism.  It is important to realize, however, that fungus can spread easily from one nail to the next on the same foot.  HOW DO WE TREAT THIS?  There are several ways to treat this condition.  Treatment may depend on many factors such as age, medications, pregnancy, liver and kidney conditions, etc.  It is best to ask your doctor which options are available to you.  1. No treatment.   Unlike many other medical concerns, you can live with this condition.  However for many people this can be a painful condition and may lead to ingrown toenails or a bacterial infection.  It is recommended that you keep the nails cut short to help reduce the amount of fungal nail. 2. Topical treatment.  These range from herbal remedies to prescription strength nail lacquers.  About 40-50% effective, topicals require twice daily application for approximately 9 to 12 months or until an entirely new nail has grown out.  The most effective topicals are medical grade medications available through physicians offices. 3. Oral antifungal medications.  With an 80-90% cure rate, the most common oral medication requires 3 to 4 months of therapy and stays in your system for a year as the new nail grows out.   Oral antifungal medications do require blood work to make sure it is a safe drug for you.  A liver function panel will be performed prior to starting the medication and after the first month of treatment.  It is important to have the blood work performed to avoid any harmful side effects.  In general, this medication safe but blood work is required. 4. Laser Therapy.  This treatment is performed by applying a specialized laser to the affected nail plate.  This therapy is noninvasive, fast, and non-painful.  It is not covered by insurance and is therefore, out of pocket.  The results have been very good with a 80-95% cure rate.  The [facility] is the only practice in the area to offer this therapy. 5. Permanent Nail Avulsion.  Removing the entire nail so that a new nail will not grow back. 

## 2018-10-20 LAB — BCR-ABL1, CML/ALL, PCR, QUANT

## 2018-10-21 ENCOUNTER — Ambulatory Visit (HOSPITAL_COMMUNITY): Payer: Medicare HMO | Admitting: Nurse Practitioner

## 2018-10-22 ENCOUNTER — Other Ambulatory Visit: Payer: Self-pay

## 2018-10-22 ENCOUNTER — Inpatient Hospital Stay (HOSPITAL_BASED_OUTPATIENT_CLINIC_OR_DEPARTMENT_OTHER): Payer: Medicare HMO | Admitting: Nurse Practitioner

## 2018-10-22 ENCOUNTER — Encounter (HOSPITAL_COMMUNITY): Payer: Self-pay | Admitting: Nurse Practitioner

## 2018-10-22 VITALS — BP 135/53 | HR 85 | Temp 97.3°F | Resp 16 | Wt 129.1 lb

## 2018-10-22 DIAGNOSIS — K219 Gastro-esophageal reflux disease without esophagitis: Secondary | ICD-10-CM | POA: Diagnosis not present

## 2018-10-22 DIAGNOSIS — C9210 Chronic myeloid leukemia, BCR/ABL-positive, not having achieved remission: Secondary | ICD-10-CM

## 2018-10-22 DIAGNOSIS — Z9221 Personal history of antineoplastic chemotherapy: Secondary | ICD-10-CM | POA: Diagnosis not present

## 2018-10-22 DIAGNOSIS — R634 Abnormal weight loss: Secondary | ICD-10-CM | POA: Diagnosis not present

## 2018-10-22 DIAGNOSIS — R439 Unspecified disturbances of smell and taste: Secondary | ICD-10-CM | POA: Diagnosis not present

## 2018-10-22 DIAGNOSIS — R001 Bradycardia, unspecified: Secondary | ICD-10-CM | POA: Diagnosis not present

## 2018-10-22 DIAGNOSIS — E785 Hyperlipidemia, unspecified: Secondary | ICD-10-CM | POA: Diagnosis not present

## 2018-10-22 DIAGNOSIS — I10 Essential (primary) hypertension: Secondary | ICD-10-CM | POA: Diagnosis not present

## 2018-10-22 DIAGNOSIS — R5383 Other fatigue: Secondary | ICD-10-CM | POA: Diagnosis not present

## 2018-10-22 MED ORDER — MIRTAZAPINE 7.5 MG PO TABS: 7.5000 mg | ORAL_TABLET | Freq: Every day | ORAL | 1 refills | Status: DC

## 2018-10-22 NOTE — Patient Instructions (Signed)
[facility] at [facility] Discharge Instructions     Thank you for choosing [facility] at [facility] to provide your oncology and hematology care.  To afford each patient quality time with our provider, please arrive at least 15 minutes before your scheduled appointment time.   If you have a lab appointment with the Cancer Center please come in thru the  Main Entrance and check in at the main information desk  You need to re-schedule your appointment should you arrive 10 or more minutes late.  We strive to give you quality time with our providers, and arriving late affects you and other patients whose appointments are after yours.  Also, if you no show three or more times for appointments you may be dismissed from the clinic at the providers discretion.     Again, thank you for choosing [facility].  Our hope is that these requests will decrease the amount of time that you wait before being seen by our physicians.       _____________________________________________________________  Should you have questions after your visit to [facility], please contact our office at [phone] between the hours of 8:00 a.m. and 4:30 p.m.  Voicemails left after 4:00 p.m. will not be returned until the following business day.  For prescription refill requests, have your pharmacy contact our office and allow 72 hours.    Cancer Center Support Programs:   > Cancer Support Group  2nd Tuesday of the month 1pm-2pm, Journey Room    

## 2018-10-22 NOTE — Assessment & Plan Note (Signed)
1.  Chronic myelogenous leukemia (CML), BCR-ABL1-positive ([place]): -CML in chronic phase. -Diagnosed in March 2016 -Was on Nilotinib 300 mg twice daily started in May 2016. -It was discontinued in July 2018 secondary to recurrent strokes. -Dasatinib 2 mg daily started in February 2019.  Patient reportedly tolerating therapy well.   -Labs done on 10/22/2018 showed WBC 4.2, hemoglobin 10.4, platelets 235, potassium 5.0, creatinine 1.47.  Ferritin 525 percent saturation 11 BCR/ABL is negative -We will continue the Dasatinib at this time. -She denies any shortness of breath, easy bruising, night sweats, fevers, chills, or unexplained weight loss. -We will follow-up in 3 months with repeat labs  2.  Decreased appetite: - Patient has lost over 20 pounds since February of this year. -She reports she feels hungry however her taste is gone. -She is drinking 2-3 ensures daily. -I have prescribed Remeron 7.5 mg daily at bedtime.  3.  Bradycardia: - Patient was noted to have Mobitz 2 AV block. -Pacemaker placed when patient was admitted 05/06/2020 05/08/2018. -Heart rate today is 1 -Patient will follow-up with cardiology as directed.

## 2018-10-22 NOTE — Progress Notes (Signed)
[address] [place], [place] [address]   CLINIC:  Medical Oncology/Hematology  PCP:  [facility], [address] [place] [address] [phone]   REASON FOR VISIT: Follow-up for CLL  CURRENT THERAPY: Dasatinib  BRIEF ONCOLOGIC HISTORY:  Oncology History  Chronic myelogenous leukemia (CML), BCR-ABL1-positive ([place])  06/01/2014 Tumor Marker   Results for BERNIE, FOBES (MRN [id number]) as of 12/02/2014 09:30  06/01/2014 09:59 BCR ABL1 / ABL1 IS: 71.220 (H)    07/14/2014 Bone Marrow Biopsy   Bone Marrow, Aspirate,Biopsy, and Clot, right iliac - HYPERCELLULAR BONE MARROW WITH CHRONIC MYELOGENOUS LEUKEMIA. - SEE COMMENT. PERIPHERAL BLOOD: - CHRONIC MYELOGENOUS LEUKEMIA. Diagnosis Note Previous PCR analysis performed for bcr-abl1 is report   07/21/2014 - 08/04/2014 Chemotherapy   Tasigna 300 mg BID   08/04/2014 Miscellaneous   Ran out of 2 week sample secondary to issues with insurance approval.   08/15/2014 -  Chemotherapy   Tasigna 300 mg BID   09/17/2014 Tumor Marker   b3a2 transcript % 4.122    12/02/2014 Tumor Marker   b3a2 transcript: 0.046    03/31/2015 Tumor Marker   b3a2 transcript:  <0.001 %      03/31/2015 Remission   Complete molecular response, no detectable BCR/ABL using PCR assay   05/24/2016 Remission   NEGATIVE for the BCR-ABL1 e1a2 (p190), e13a2 (b2a2, p210) and e14a2 (b3a2, p210) fusion transcripts.       INTERVAL HISTORY:  Ms. Guiffre 73 y.o. female returns for routine follow-up for CLL.  Patient reports she has no energy and she is unable to taste any food which is causing her decreased appetite and her to lose weight.  She is drinking 2 to 3 cans of Ensure daily.  She denies any other symptoms. Denies any nausea, vomiting, or diarrhea. Denies any new pains. Had not noticed any recent bleeding such as epistaxis, hematuria or hematochezia. Denies recent chest pain on exertion, shortness of breath on minimal exertion,  pre-syncopal episodes, or palpitations. Denies any numbness or tingling in hands or feet. Denies any recent fevers, infections, or recent hospitalizations. Patient reports appetite at 0% and energy level at 0%.     REVIEW OF SYSTEMS:  Review of Systems  Constitutional: Positive for appetite change, fatigue and unexpected weight change.  HENT:   Positive for trouble swallowing.   All other systems reviewed and are negative.    PAST MEDICAL/SURGICAL HISTORY:  Past Medical History:  Diagnosis Date  . Arthritis    stiff knees  . Blood dyscrasia   . Cancer ([place])   . CVA (cerebral vascular accident) ([place])   . GERD (gastroesophageal reflux disease)   . H/O: CML (chronic myeloid leukemia)   . Hyperlipidemia   . Hyperlipidemia   . Hypertension   . Iron deficiency anemia 07/15/2018  . Obesity   . Pessary maintenance 07/01/2013  . Stroke ([place])    x 3   Past Surgical History:  Procedure Laterality Date  . ABDOMINAL HYSTERECTOMY    . BIOPSY  08/20/2018   Procedure: BIOPSY;  Surgeon: [name], MD;  [facility];  Service: Endoscopy;;  gastric  . CHOLECYSTECTOMY    . COLONOSCOPY  06/09/2002   EZM:OQHUTMLY hemorrhoids; otherwise normal rectum, colon   . COLONOSCOPY N/A 08/25/2013   YTK:PTWSFKC diverticulosis. Single colonic polyp-removed  s/p segmental biopsy and stool sample. random colon bx negative. +benign leiomyoma.  . cystocele/rectocele repair  2009  . ESOPHAGOGASTRODUODENOSCOPY  06/09/2002   LEX:NTZGYF upper  gastrointestinal tract s/p  54-French Maloney dilator  . ESOPHAGOGASTRODUODENOSCOPY (EGD) WITH PROPOFOL N/A 08/20/2018   Procedure: ESOPHAGOGASTRODUODENOSCOPY (EGD) WITH PROPOFOL;  Surgeon: [name], MD;  [facility];  Service: Endoscopy;  Laterality: N/A;  9:30am  . FEMUR IM NAIL Right 04/12/2016   Procedure: INTRAMEDULLARY (IM) NAIL FEMORAL;  Surgeon: Nicholes Stairs, MD;  Location: [facility];  Service: Orthopedics;  Laterality: Right;  .  HERNIA REPAIR    . IR THORACENTESIS ASP PLEURAL SPACE W/IMG GUIDE  05/07/2018  . LOOP RECORDER INSERTION N/A 08/23/2016   Procedure: Loop Recorder Insertion;  Surgeon: Constance Haw, MD;  Location: [facility];  Service: Cardiovascular;  Laterality: N/A;  . LOOP RECORDER REMOVAL N/A 05/07/2018   Procedure: LOOP RECORDER REMOVAL;  Surgeon: Constance Haw, MD;  Location: [facility];  Service: Cardiovascular;  Laterality: N/A;  . Venia Minks DILATION N/A 08/20/2018   Procedure: Venia Minks DILATION;  Surgeon: [name], MD;  [facility];  Service: Endoscopy;  Laterality: N/A;  . OOPHORECTOMY    . PACEMAKER IMPLANT N/A 05/07/2018   Procedure: PACEMAKER IMPLANT;  Surgeon: Constance Haw, MD;  Location: [facility];  Service: Cardiovascular;  Laterality: N/A;  . TEE WITHOUT CARDIOVERSION N/A 08/23/2016   Procedure: TRANSESOPHAGEAL ECHOCARDIOGRAM (TEE);  Surgeon: Fay Records, MD;  Location: [facility];  Service: Cardiovascular;  Laterality: N/A;     SOCIAL HISTORY:  Social History   Socioeconomic History  . Marital status: Widowed    Spouse name: Not on file  . Number of children: 6  . Years of education: associate  . Highest education level: Not on file  Occupational History    Employer: RETIRED  Social Needs  . Financial resource strain: Not on file  . Food insecurity    Worry: Not on file    Inability: Not on file  . Transportation needs    Medical: Not on file    Non-medical: Not on file  Tobacco Use  . Smoking status: Never Smoker  . Smokeless tobacco: Never Used  Substance and Sexual Activity  . Alcohol use: No  . Drug use: No  . Sexual activity: Not Currently    Birth control/protection: Surgical    Comment: hyst  Lifestyle  . Physical activity    Days per week: Not on file    Minutes per session: Not on file  . Stress: Not on file  Relationships  . Social Herbalist on phone: Not on file    Gets together:  Not on file    Attends religious service: Not on file    Active member of club or organization: Not on file    Attends meetings of clubs or organizations: Not on file    Relationship status: Not on file  . Intimate partner violence    Fear of current or ex partner: Not on file    Emotionally abused: Not on file    Physically abused: Not on file    Forced sexual activity: Not on file  Other Topics Concern  . Not on file  Social History Narrative  . Not on file    FAMILY HISTORY:  Family History  Problem Relation Age of Onset  . Anuerysm Mother        deceased age 83, brain anuerysm  . Early death Mother 33  . Heart disease Father   . Hypertension Sister   . Obesity Brother   . Hypertension  Sister   . Arthritis Sister   . Other Son        cardiac arrest  . Heart disease Child 19       cardiac arrest  . Colon cancer Neg Hx     CURRENT MEDICATIONS:  Outpatient Encounter Medications as of 10/22/2018  Medication Sig  . acetaminophen (TYLENOL) 325 MG tablet Take 2 tablets (650 mg total) by mouth every 6 (six) hours as needed for mild pain (or temp > 37.5 C (99.5 F)).  Marland Kitchen amLODipine (NORVASC) 10 MG tablet Take 1 tablet (10 mg total) by mouth daily.  Marland Kitchen aspirin 325 MG tablet Take 1 tablet (325 mg total) by mouth daily.  Marland Kitchen atorvastatin (LIPITOR) 40 MG tablet Take 1 tablet (40 mg total) by mouth daily at 6 PM.  . clotrimazole (LOTRIMIN) 1 % cream Apply to both feet and between toes twice daily for 4 weeks.  . dasatinib (SPRYCEL) 100 MG tablet Take 1 tablet (100 mg total) by mouth daily.  . feeding supplement, ENSURE ENLIVE, (ENSURE ENLIVE) LIQD Take 237 mLs by mouth 2 (two) times daily between meals.  . hydrALAZINE (APRESOLINE) 25 MG tablet Take 1 tablet (25 mg total) by mouth 2 (two) times daily.  Marland Kitchen losartan (COZAAR) 100 MG tablet Take 1 tablet (100 mg total) by mouth daily. Resume on 3/28 (Patient taking differently: Take 100 mg by mouth daily. )  . Multiple Vitamin (MULTIVITAMIN  WITH MINERALS) TABS tablet Take 1 tablet by mouth daily.  . Nutritional Supplements (BOOST PO) Take by mouth 2 (two) times daily.  . pantoprazole (PROTONIX) 40 MG tablet TK 1 T PO QD  . polyethylene glycol (MIRALAX / GLYCOLAX) packet Take 17 g by mouth daily. (Patient taking differently: Take 17 g by mouth every other day. )  . sennosides-docusate sodium (SENOKOT-S) 8.6-50 MG tablet Take 1 tablet by mouth every evening.  . mirtazapine (REMERON) 7.5 MG tablet Take 1 tablet (7.5 mg total) by mouth at bedtime.   No facility-administered encounter medications on file as of 10/22/2018.     ALLERGIES:  No Known Allergies   PHYSICAL EXAM:  ECOG Performance status: 2  Vitals:   10/22/18 1400  BP: (!) 135/53  Pulse: 85  Resp: 16  Temp: (!) 97.3 F (36.3 C)  SpO2: 97%   Filed Weights   10/22/18 1400  Weight: 129 lb 2 oz (58.6 kg)    Physical Exam Constitutional:      Appearance: She is normal weight.  Cardiovascular:     Rate and Rhythm: Normal rate and regular rhythm.     Heart sounds: Normal heart sounds.  Pulmonary:     Effort: Pulmonary effort is normal.     Breath sounds: Normal breath sounds.  Abdominal:     General: Bowel sounds are normal.     Palpations: Abdomen is soft.  Musculoskeletal: Normal range of motion.  Skin:    General: Skin is warm and dry.  Neurological:     Mental Status: She is alert and oriented to person, place, and time. Mental status is at baseline.  Psychiatric:        Mood and Affect: Mood normal.        Behavior: Behavior normal.        Thought Content: Thought content normal.        Judgment: Judgment normal.      LABORATORY DATA:  I have reviewed the labs as listed.  CBC    Component Value Date/Time   WBC  4.2 10/14/2018 1049   RBC 3.57 (L) 10/14/2018 1049   HGB 10.4 (L) 10/14/2018 1049   HCT 34.1 (L) 10/14/2018 1049   PLT 235 10/14/2018 1049   MCV 95.5 10/14/2018 1049   MCH 29.1 10/14/2018 1049   MCHC 30.5 10/14/2018 1049    RDW 14.4 10/14/2018 1049   LYMPHSABS 2.0 10/14/2018 1049   MONOABS 0.5 10/14/2018 1049   EOSABS 0.0 10/14/2018 1049   BASOSABS 0.0 10/14/2018 1049   CMP Latest Ref Rng & Units 10/14/2018 08/14/2018 07/15/2018  Glucose 70 - 99 mg/dL 94 103(H) 77  BUN 8 - 23 mg/dL 27(H) 24(H) 26(H)  Creatinine 0.44 - 1.00 mg/dL 1.47(H) 1.53(H) 1.71(H)  Sodium 135 - 145 mmol/L 141 141 140  Potassium 3.5 - 5.1 mmol/L 5.0 5.1 5.7(H)  Chloride 98 - 111 mmol/L 107 108 111  CO2 22 - 32 mmol/L 23 25 18(L)  Calcium 8.9 - 10.3 mg/dL 9.1 8.9 8.6(L)  Total Protein 6.5 - 8.1 g/dL 7.5 - 7.4  Total Bilirubin 0.3 - 1.2 mg/dL 0.4 - 0.9  Alkaline Phos 38 - 126 U/L 158(H) - 184(H)  AST 15 - 41 U/L 49(H) - 59(H)  ALT 0 - 44 U/L 44 - 39     I personally performed a face-to-face visit.  All questions were answered to patient's stated satisfaction. Encouraged patient to call with any new concerns or questions before his next visit to the cancer center and we can certain see him sooner, if needed.     ASSESSMENT & PLAN:   Chronic myelogenous leukemia (CML), BCR-ABL1-positive ([place]) 1.  Chronic myelogenous leukemia (CML), BCR-ABL1-positive ([place]): -CML in chronic phase. -Diagnosed in March 2016 -Was on Nilotinib 300 mg twice daily started in May 2016. -It was discontinued in July 2018 secondary to recurrent strokes. -Dasatinib 2 mg daily started in February 2019.  Patient reportedly tolerating therapy well.   -Labs done on 10/22/2018 showed WBC 4.2, hemoglobin 10.4, platelets 235, potassium 5.0, creatinine 1.47.  Ferritin 525 percent saturation 11 BCR/ABL is negative -We will continue the Dasatinib at this time. -She denies any shortness of breath, easy bruising, night sweats, fevers, chills, or unexplained weight loss. -We will follow-up in 3 months with repeat labs  2.  Decreased appetite: - Patient has lost over 20 pounds since February of this year. -She reports she feels hungry however her taste is gone. -She is  drinking 2-3 ensures daily. -I have prescribed Remeron 7.5 mg daily at bedtime.  3.  Bradycardia: - Patient was noted to have Mobitz 2 AV block. -Pacemaker placed when patient was admitted 05/06/2020 05/08/2018. -Heart rate today is 61 -Patient will follow-up with cardiology as directed.       Orders placed this encounter:  Orders Placed This Encounter  Procedures  . BCR-ABL1, CML/ALL, PCR, QUANT  . Lactate dehydrogenase  . CBC with Differential/Platelet  . Comprehensive metabolic panel  . Ferritin  . Iron and TIBC  . VITAMIN D 25 Hydroxy (Vit-D Deficiency, Fractures)  . Vitamin B12  . Folate      Francene Finders, FNP-C [facility] [phone]

## 2018-10-27 NOTE — Progress Notes (Signed)
Subjective:  April Page presents to clinic today with cc of  painful, thick, discolored, elongated toenails 1-5 b/l that become tender and cannot cut because of thickness.  Pain is aggravated when wearing enclosed shoe gear.  For her tinea pedis, she states she has used the prescribed cream and notices improvement.  She voices no new pedal concerns on today's visit.  [facility] School, MD is her PCP.   No Known Allergies   Objective: Physical Examination:  Vascular Examination: Capillary refill time immediate x 10 digits.  Palpable DP/PT pulses b/l.  Digital hair present b/l.  No edema noted b/l.  Skin temperature gradient WNL b/l.  Dermatological Examination: Skin with normal turgor, texture and tone b/l.  No open wounds b/l.  No interdigital macerations noted b/l.  Elongated, thick, discolored brittle toenails with subungual debris and pain on dorsal palpation of nailbeds 1-5 b/l.  Porokeratotic lesions submet head 5 right foot with tenderness to palpation. No erythema, no edema, no drainage, no flocculence.  Macule plantar right foot unchanged 0.5 x 1.2 cm. Ovoid. No change in color. No asymmetry. No bleeding. No depth or elevation.   Musculoskeletal Examination: Muscle strength 5/5 to all muscle groups b/l  No pain, crepitus or joint discomfort with active/passive ROM.  Neurological Examination: Sensation intact 5/5 b/l with 10 gram monofilament.  Vibratory sensation intact b/l.  Proprioceptive sensation intact b/l.  Assessment: Mycotic nail infection with pain 1-5 b/l Porokeratotic lesion submet head 5 right foot NIDDM  Plan: 1. Toenails 1-5 b/l were debrided in length and girth without iatrogenic laceration. Porokeratosis submet head 5 right foot pared and enucleated with sterile scalpel blade without incident. 2.  Continue soft, supportive shoe gear daily. 3.  Report any pedal injuries to medical professional. 4.  Follow up 3 months. 5.   Patient/POA to call should there be a question/concern in there interim.

## 2018-11-04 ENCOUNTER — Ambulatory Visit (INDEPENDENT_AMBULATORY_CARE_PROVIDER_SITE_OTHER): Payer: Medicare HMO | Admitting: *Deleted

## 2018-11-04 DIAGNOSIS — R001 Bradycardia, unspecified: Secondary | ICD-10-CM

## 2018-11-04 DIAGNOSIS — I441 Atrioventricular block, second degree: Secondary | ICD-10-CM | POA: Diagnosis not present

## 2018-11-04 DIAGNOSIS — I639 Cerebral infarction, unspecified: Secondary | ICD-10-CM

## 2018-11-06 LAB — CUP PACEART REMOTE DEVICE CHECK
Battery Remaining Longevity: 158 mo
Battery Voltage: 3.16 V
Brady Statistic AP VP Percent: 0.56 %
Brady Statistic AP VS Percent: 0.28 %
Brady Statistic AS VP Percent: 35.39 %
Brady Statistic AS VS Percent: 63.77 %
Brady Statistic RA Percent Paced: 0.85 %
Brady Statistic RV Percent Paced: 35.96 %
Date Time Interrogation Session: 20200828074659
Implantable Lead Implant Date: 20200227
Implantable Lead Implant Date: 20200227
Implantable Lead Location: 753859
Implantable Lead Location: 753860
Implantable Lead Model: 5076
Implantable Lead Model: 5076
Implantable Pulse Generator Implant Date: 20200227
Lead Channel Impedance Value: 228 Ohm
Lead Channel Impedance Value: 304 Ohm
Lead Channel Impedance Value: 342 Ohm
Lead Channel Impedance Value: 456 Ohm
Lead Channel Pacing Threshold Amplitude: 0.375 V
Lead Channel Pacing Threshold Amplitude: 0.5 V
Lead Channel Pacing Threshold Pulse Width: 0.4 ms
Lead Channel Pacing Threshold Pulse Width: 0.4 ms
Lead Channel Sensing Intrinsic Amplitude: 1.5 mV
Lead Channel Sensing Intrinsic Amplitude: 1.5 mV
Lead Channel Sensing Intrinsic Amplitude: 10.25 mV
Lead Channel Sensing Intrinsic Amplitude: 10.25 mV
Lead Channel Setting Pacing Amplitude: 1.5 V
Lead Channel Setting Pacing Amplitude: 2 V
Lead Channel Setting Pacing Pulse Width: 0.4 ms
Lead Channel Setting Sensing Sensitivity: 2 mV

## 2018-11-09 DIAGNOSIS — E782 Mixed hyperlipidemia: Secondary | ICD-10-CM | POA: Diagnosis not present

## 2018-11-09 DIAGNOSIS — I10 Essential (primary) hypertension: Secondary | ICD-10-CM | POA: Diagnosis not present

## 2018-11-09 DIAGNOSIS — E11319 Type 2 diabetes mellitus with unspecified diabetic retinopathy without macular edema: Secondary | ICD-10-CM | POA: Diagnosis not present

## 2018-11-09 DIAGNOSIS — R2681 Unsteadiness on feet: Secondary | ICD-10-CM | POA: Diagnosis not present

## 2018-11-11 MED FILL — SPRYCEL 100 MG TABLET: 100 | 30 days supply | Qty: 30 | Fill #6

## 2018-11-13 ENCOUNTER — Encounter: Payer: Self-pay | Admitting: Cardiology

## 2018-11-13 NOTE — Progress Notes (Signed)
Remote pacemaker transmission.   

## 2018-12-08 MED FILL — SPRYCEL 100 MG TABLET: 100 | 30 days supply | Qty: 30 | Fill #7

## 2018-12-09 DIAGNOSIS — I10 Essential (primary) hypertension: Secondary | ICD-10-CM | POA: Diagnosis not present

## 2018-12-09 DIAGNOSIS — E1165 Type 2 diabetes mellitus with hyperglycemia: Secondary | ICD-10-CM | POA: Diagnosis not present

## 2018-12-09 DIAGNOSIS — E7849 Other hyperlipidemia: Secondary | ICD-10-CM | POA: Diagnosis not present

## 2018-12-09 DIAGNOSIS — I639 Cerebral infarction, unspecified: Secondary | ICD-10-CM | POA: Diagnosis not present

## 2018-12-11 ENCOUNTER — Telehealth (HOSPITAL_COMMUNITY): Payer: Self-pay | Admitting: Pharmacy Technician

## 2018-12-11 NOTE — Telephone Encounter (Signed)
Oral Oncology Patient Advocate Encounter  I spoke with patients daughter, April Page, on 12/08/2018 in regards to the [place] not having enough funds to cover her copay for Sprycel at the [facility].  We talked about signing up for patient assistance.  April Page said that her mother had 12 tablets on 9/30.  April Page was to get her mother to sign the application I emailed her and her sister was to get the Out of Pocket statement from [employer].  Once she got it she was going to email it to me on Thursday.  I called and left a voicemail this morning, Friday 10/2, to check in to see if there were any issues since I hadn't received the application yet.  I will continue to update this encounter.  [place] Patient [place] Phone [phone] Fax [phone] 12/11/2018 8:53 AM

## 2018-12-14 NOTE — Telephone Encounter (Signed)
Sent email this morning and left voicemail this afternoon.

## 2018-12-15 NOTE — Telephone Encounter (Signed)
Left voicemail with daughter [name].

## 2018-12-17 MED FILL — SPRYCEL 100 MG TABLET: 100 | 15 days supply | Qty: 15 | Fill #7

## 2018-12-18 NOTE — Telephone Encounter (Signed)
Oral Chemotherapy Pharmacist Encounter   Received a call from [place] asking if we had received the BI fax from them. I told them we did receive their fax and that the patient still needs assistance due to a high copay. They asked for PA approval information for the Sprycel and that information was given to them.   They will now refer the patient over to their patient assistance program.  Darl Pikes, PharmD, BCPS, [facility] Hematology/Oncology Clinical Pharmacist [facility] [phone]  12/18/2018 3:25 PM

## 2018-12-25 NOTE — Telephone Encounter (Signed)
Oral Oncology Patient Advocate Encounter  Received notification from [facility] that patient has been denied enrollment into their program to receive Sprycel from the drug manufacturer.      Reason for denial:  Patient has not met out of pocket prescriptions costs of $483 to qualify for assistance.  I will call Debroah Baller, patients daughter, to let her know.     Oral Oncology Clinic will continue to follow.  [place] Patient [place] Phone [phone] Fax [phone] 12/25/2018 9:54 AM

## 2019-01-05 NOTE — Telephone Encounter (Signed)
Spoke with Debroah Baller yesterday and she was going to speak to her mother this morning about the cost of getting Sprycel without grant funding so that she could qualify for patient assistance.  Debroah Baller stated that she would call and let me know the decision today.

## 2019-01-07 MED FILL — SPRYCEL 100 MG TABLET: 100 | 16 days supply | Qty: 16 | Fill #8

## 2019-01-08 NOTE — Telephone Encounter (Signed)
Faxed receipt of OOP spending to [employer] on 01/07/2019.  Received fax today, 01/08/2019, that patient has been approved for assistance through their program.  Patient will receive Sprycel free-of-charge from [employer] from 01/07/2019-03/11/2019.  I will call April Page to let her know of the approval and to expect a call from the dispensing pharmacy to set up shipment of her mothers medication.

## 2019-01-09 DIAGNOSIS — E782 Mixed hyperlipidemia: Secondary | ICD-10-CM | POA: Diagnosis not present

## 2019-01-09 DIAGNOSIS — I10 Essential (primary) hypertension: Secondary | ICD-10-CM | POA: Diagnosis not present

## 2019-01-09 DIAGNOSIS — E119 Type 2 diabetes mellitus without complications: Secondary | ICD-10-CM | POA: Diagnosis not present

## 2019-01-12 NOTE — Telephone Encounter (Signed)
Oral Oncology Patient Advocate Encounter  Called [employer] to check shipment status of Sprycel.  Rep stated that Sprycel is scheduled to be delivered today 01/12/2019.

## 2019-01-14 ENCOUNTER — Other Ambulatory Visit: Payer: Self-pay

## 2019-01-14 ENCOUNTER — Inpatient Hospital Stay (HOSPITAL_COMMUNITY): Payer: Medicare HMO | Attending: Hematology

## 2019-01-14 DIAGNOSIS — Z8673 Personal history of transient ischemic attack (TIA), and cerebral infarction without residual deficits: Secondary | ICD-10-CM | POA: Insufficient documentation

## 2019-01-14 DIAGNOSIS — Z950 Presence of cardiac pacemaker: Secondary | ICD-10-CM | POA: Insufficient documentation

## 2019-01-14 DIAGNOSIS — Z9221 Personal history of antineoplastic chemotherapy: Secondary | ICD-10-CM | POA: Diagnosis not present

## 2019-01-14 DIAGNOSIS — Z79899 Other long term (current) drug therapy: Secondary | ICD-10-CM | POA: Insufficient documentation

## 2019-01-14 DIAGNOSIS — K5900 Constipation, unspecified: Secondary | ICD-10-CM | POA: Insufficient documentation

## 2019-01-14 DIAGNOSIS — R5383 Other fatigue: Secondary | ICD-10-CM | POA: Diagnosis not present

## 2019-01-14 DIAGNOSIS — K219 Gastro-esophageal reflux disease without esophagitis: Secondary | ICD-10-CM | POA: Insufficient documentation

## 2019-01-14 DIAGNOSIS — I10 Essential (primary) hypertension: Secondary | ICD-10-CM | POA: Diagnosis not present

## 2019-01-14 DIAGNOSIS — M1990 Unspecified osteoarthritis, unspecified site: Secondary | ICD-10-CM | POA: Diagnosis not present

## 2019-01-14 DIAGNOSIS — E785 Hyperlipidemia, unspecified: Secondary | ICD-10-CM | POA: Insufficient documentation

## 2019-01-14 DIAGNOSIS — C9210 Chronic myeloid leukemia, BCR/ABL-positive, not having achieved remission: Secondary | ICD-10-CM | POA: Diagnosis not present

## 2019-01-14 DIAGNOSIS — Z7982 Long term (current) use of aspirin: Secondary | ICD-10-CM | POA: Insufficient documentation

## 2019-01-14 DIAGNOSIS — R001 Bradycardia, unspecified: Secondary | ICD-10-CM | POA: Insufficient documentation

## 2019-01-14 DIAGNOSIS — R439 Unspecified disturbances of smell and taste: Secondary | ICD-10-CM | POA: Diagnosis not present

## 2019-01-14 LAB — COMPREHENSIVE METABOLIC PANEL
ALT: 32 U/L (ref 0–44)
AST: 37 U/L (ref 15–41)
Albumin: 3.8 g/dL (ref 3.5–5.0)
Alkaline Phosphatase: 109 U/L (ref 38–126)
Anion gap: 10 (ref 5–15)
BUN: 55 mg/dL — ABNORMAL HIGH (ref 8–23)
CO2: 23 mmol/L (ref 22–32)
Calcium: 8.9 mg/dL (ref 8.9–10.3)
Chloride: 104 mmol/L (ref 98–111)
Creatinine, Ser: 1.82 mg/dL — ABNORMAL HIGH (ref 0.44–1.00)
GFR calc Af Amer: 31 mL/min — ABNORMAL LOW (ref >60)
GFR calc non Af Amer: 27 mL/min — ABNORMAL LOW (ref >60)
Glucose, Bld: 110 mg/dL — ABNORMAL HIGH (ref 70–99)
Potassium: 4.5 mmol/L (ref 3.5–5.1)
Sodium: 137 mmol/L (ref 135–145)
Total Bilirubin: 0.3 mg/dL (ref 0.3–1.2)
Total Protein: 7.6 g/dL (ref 6.5–8.1)

## 2019-01-14 LAB — CBC WITH DIFFERENTIAL/PLATELET
Abs Immature Granulocytes: 0.01 10*3/uL (ref 0.00–0.07)
Basophils Absolute: 0 10*3/uL (ref 0.0–0.1)
Basophils Relative: 0 %
Eosinophils Absolute: 0 10*3/uL (ref 0.0–0.5)
Eosinophils Relative: 1 %
HCT: 33.7 % — ABNORMAL LOW (ref 36.0–46.0)
Hemoglobin: 10.6 g/dL — ABNORMAL LOW (ref 12.0–15.0)
Immature Granulocytes: 0 %
Lymphocytes Relative: 51 %
Lymphs Abs: 2.6 10*3/uL (ref 0.7–4.0)
MCH: 29.4 pg (ref 26.0–34.0)
MCHC: 31.5 g/dL (ref 30.0–36.0)
MCV: 93.4 fL (ref 80.0–100.0)
Monocytes Absolute: 0.6 10*3/uL (ref 0.1–1.0)
Monocytes Relative: 11 %
Neutro Abs: 1.9 10*3/uL (ref 1.7–7.7)
Neutrophils Relative %: 37 %
Platelets: 250 10*3/uL (ref 150–400)
RBC: 3.61 MIL/uL — ABNORMAL LOW (ref 3.87–5.11)
RDW: 15.2 % (ref 11.5–15.5)
WBC: 5.2 10*3/uL (ref 4.0–10.5)
nRBC: 0 % (ref 0.0–0.2)

## 2019-01-14 LAB — FERRITIN: Ferritin: 224 ng/mL (ref 11–307)

## 2019-01-14 LAB — IRON AND TIBC
Iron: 48 ug/dL (ref 28–170)
Saturation Ratios: 15 % (ref 10.4–31.8)
TIBC: 319 ug/dL (ref 250–450)
UIBC: 271 ug/dL

## 2019-01-14 LAB — FOLATE: Folate: 17.8 ng/mL (ref >5.9)

## 2019-01-14 LAB — VITAMIN D 25 HYDROXY (VIT D DEFICIENCY, FRACTURES): Vit D, 25-Hydroxy: 34.9 ng/mL (ref 30–100)

## 2019-01-14 LAB — LACTATE DEHYDROGENASE: LDH: 238 U/L — ABNORMAL HIGH (ref 98–192)

## 2019-01-14 LAB — VITAMIN B12: Vitamin B-12: 912 pg/mL (ref 180–914)

## 2019-01-18 ENCOUNTER — Other Ambulatory Visit: Payer: Self-pay

## 2019-01-18 ENCOUNTER — Ambulatory Visit (INDEPENDENT_AMBULATORY_CARE_PROVIDER_SITE_OTHER): Payer: Medicare HMO | Admitting: Podiatry

## 2019-01-18 DIAGNOSIS — L84 Corns and callosities: Secondary | ICD-10-CM

## 2019-01-18 DIAGNOSIS — B351 Tinea unguium: Secondary | ICD-10-CM | POA: Diagnosis not present

## 2019-01-18 DIAGNOSIS — M7752 Other enthesopathy of left foot: Secondary | ICD-10-CM | POA: Diagnosis not present

## 2019-01-18 DIAGNOSIS — E119 Type 2 diabetes mellitus without complications: Secondary | ICD-10-CM

## 2019-01-18 DIAGNOSIS — M79675 Pain in left toe(s): Secondary | ICD-10-CM | POA: Diagnosis not present

## 2019-01-18 DIAGNOSIS — M778 Other enthesopathies, not elsewhere classified: Secondary | ICD-10-CM

## 2019-01-18 DIAGNOSIS — M79674 Pain in right toe(s): Secondary | ICD-10-CM | POA: Diagnosis not present

## 2019-01-19 ENCOUNTER — Encounter: Payer: Self-pay | Admitting: Podiatry

## 2019-01-19 NOTE — Progress Notes (Signed)
  Subjective:  Patient ID: April Page, female    DOB: [birth year],  MRN: [id number]  Chief Complaint  Patient presents with  . Nail Problem    pt is here for a 3 month nail trim, pt also has a sore on the left 5th toenail, located on the medial side   73 y.o. female returns for the above complaint.  Patient is here to have her painful toenails debrided.  Patient also has a secondary complaint of left fourth and fifth digital hyperkeratotic lesion that is very painful upon ambulating.  She states that this started a couple of months ago she has been wearing shoes that are tighter in the toe box.  She has not done anything to help alleviate the pain.  She does not see any other podiatrist for this.  She denies any other acute complaints.  Objective:  There were no vitals filed for this visit. Podiatric Exam: Vascular: dorsalis pedis and posterior tibial pulses are palpable bilateral. Capillary return is immediate. Temperature gradient is WNL. Skin turgor WNL  Sensorium: Normal Semmes Weinstein monofilament test. Normal tactile sensation bilaterally. Nail Exam: Pt has thick disfigured discolored nails with subungual debris noted bilateral entire nail hallux through fifth toenails Ulcer Exam: There is no evidence of ulcer or pre-ulcerative changes or infection. Orthopedic Exam: Muscle tone and strength are WNL. No limitations in general ROM. No crepitus or effusions noted. HAV  B/L.  Hammer toes 2-5  B/L. Skin: No Porokeratosis. No infection or ulcers.  Left fourth and fifth digit hyperkeratotic lesion noted.  Pain on palpation of the lesion especially the fifth digit.  Hammertoe deformity and contracture noted of the fourth and fifth leading to increased pressure in between the toes.  Assessment & Plan:  Patient was evaluated and treated and all questions answered.  Left heloma molle of the fourth and fifth digit secondary to toe deformities/capsulitis -A steroid injection was performed at  right fifth digit using 1% plain Lidocaine and 10 mg of Kenalog. This was well tolerated to decrease inflammation around the fifth digit.  I believe that will help decrease the pain from acute inflammation of the fifth digit. -Using a chisel blade, the hyperkeratotic lesion was debrided down to healthy striated tissue.  No complications noted no pinpoint bleeding noted   Onychomycosis with pain  -Nails palliatively debrided as below. -Educated on self-care  Procedure: Nail Debridement Rationale: pain  Type of Debridement: manual, sharp debridement. Instrumentation: Nail nipper, rotary burr. Number of Nails: 10  Procedures and Treatment: Consent by patient was obtained for treatment procedures. The patient understood the discussion of treatment and procedures well. All questions were answered thoroughly reviewed. Debridement of mycotic and hypertrophic toenails, 1 through 5 bilateral and clearing of subungual debris. No ulceration, no infection noted.  Return Visit-Office Procedure: Patient instructed to return to the office for a follow up visit 3 months for continued evaluation and treatment.  Boneta Lucks, DPM    Return in about 2 months (around 03/21/2019) for Routine Foot Care.

## 2019-01-21 ENCOUNTER — Ambulatory Visit (HOSPITAL_COMMUNITY): Payer: Medicare HMO | Admitting: Nurse Practitioner

## 2019-01-21 ENCOUNTER — Other Ambulatory Visit: Payer: Self-pay

## 2019-01-21 LAB — BCR-ABL1, CML/ALL, PCR, QUANT

## 2019-01-22 ENCOUNTER — Inpatient Hospital Stay (HOSPITAL_BASED_OUTPATIENT_CLINIC_OR_DEPARTMENT_OTHER): Payer: Medicare HMO | Admitting: Nurse Practitioner

## 2019-01-22 DIAGNOSIS — K5900 Constipation, unspecified: Secondary | ICD-10-CM | POA: Diagnosis not present

## 2019-01-22 DIAGNOSIS — R001 Bradycardia, unspecified: Secondary | ICD-10-CM | POA: Diagnosis not present

## 2019-01-22 DIAGNOSIS — K219 Gastro-esophageal reflux disease without esophagitis: Secondary | ICD-10-CM | POA: Diagnosis not present

## 2019-01-22 DIAGNOSIS — Z9221 Personal history of antineoplastic chemotherapy: Secondary | ICD-10-CM | POA: Diagnosis not present

## 2019-01-22 DIAGNOSIS — E785 Hyperlipidemia, unspecified: Secondary | ICD-10-CM | POA: Diagnosis not present

## 2019-01-22 DIAGNOSIS — C9210 Chronic myeloid leukemia, BCR/ABL-positive, not having achieved remission: Secondary | ICD-10-CM

## 2019-01-22 DIAGNOSIS — R5383 Other fatigue: Secondary | ICD-10-CM | POA: Diagnosis not present

## 2019-01-22 DIAGNOSIS — R439 Unspecified disturbances of smell and taste: Secondary | ICD-10-CM | POA: Diagnosis not present

## 2019-01-22 DIAGNOSIS — I10 Essential (primary) hypertension: Secondary | ICD-10-CM | POA: Diagnosis not present

## 2019-01-22 NOTE — Assessment & Plan Note (Addendum)
1.  Chronic myelogenous leukemia (CML), BCR-ABL1-positive ([place]): -CML in chronic phase. -Diagnosed in March 2016 -Was on Nilotinib 300 mg twice daily started in May 2016. -It was discontinued in July 2018 secondary to recurrent strokes. -Dasatinib 2 mg daily started in February 2019.  Patient reportedly tolerating therapy well.   -Labs done on 01/14/2019 showed WBC 12.2, hemoglobin 10.6, platelets 250, potassium 4.5, creatinine 1.82.  Ferritin 224 percent saturation 15, and BCR/ABL is negative -We will continue the Dasatinib at this time. -She denies any shortness of breath, easy bruising, night sweats, fevers, chills, or unexplained weight loss. -We will follow-up in 3 months with repeat labs  2.  Decreased appetite: - Patient has lost over 20 pounds since February of this year. -She reports she feels hungry however her taste is gone. -She is drinking 2-3 ensures daily. -She is no longer taking the Remeron due to it not helping her appetite.  3.  Bradycardia: - Patient was noted to have Mobitz 2 AV block. -Pacemaker placed when patient was admitted 05/06/2020 05/08/2018. -Heart rate today is 46 -Patient will follow-up with cardiology as directed.

## 2019-01-22 NOTE — Progress Notes (Signed)
April Page, [place] [address]   CLINIC:  Medical Oncology/Hematology  PCP:  [facility], [place] [address] [phone]   REASON FOR VISIT: Follow-up for CML  CURRENT THERAPY: Dasatinib  BRIEF ONCOLOGIC HISTORY:  Oncology History  Chronic myelogenous leukemia (CML), BCR-ABL1-positive ([place])  06/01/2014 Tumor Marker   Results for COLLYN, SELK (MRN [id number]) as of 12/02/2014 09:30  06/01/2014 09:59 BCR ABL1 / ABL1 IS: 71.220 (H)    07/14/2014 Bone Marrow Biopsy   Bone Marrow, Aspirate,Biopsy, and Clot, right iliac - HYPERCELLULAR BONE MARROW WITH CHRONIC MYELOGENOUS LEUKEMIA. - SEE COMMENT. PERIPHERAL BLOOD: - CHRONIC MYELOGENOUS LEUKEMIA. Diagnosis Note Previous PCR analysis performed for bcr-abl1 is report   07/21/2014 - 08/04/2014 Chemotherapy   Tasigna 300 mg BID   08/04/2014 Miscellaneous   Ran out of 2 week sample secondary to issues with insurance approval.   08/15/2014 -  Chemotherapy   Tasigna 300 mg BID   09/17/2014 Tumor Marker   b3a2 transcript % 4.122    12/02/2014 Tumor Marker   b3a2 transcript: 0.046    03/31/2015 Tumor Marker   b3a2 transcript:  <0.001 %      03/31/2015 Remission   Complete molecular response, no detectable BCR/ABL using PCR assay   05/24/2016 Remission   NEGATIVE for the BCR-ABL1 e1a2 (p190), e13a2 (b2a2, p210) and e14a2 (b3a2, p210) fusion transcripts.      INTERVAL HISTORY:  Ms. April Page 73 y.o. female returns for routine follow-up for CML.  Patient reports she is doing well except for her lack of taste.  She is still not eating a whole lot of solid foods.  She is drinking Ensure daily 3-4 of them.  She denies any bright red bleeding per rectum or melena.  She denies any fevers night sweats chills or unexplained weight loss. Denies any nausea, vomiting, or diarrhea. Denies any new pains. Had not noticed any recent bleeding such as epistaxis, hematuria or hematochezia.  Denies recent chest pain on exertion, shortness of breath on minimal exertion, pre-syncopal episodes, or palpitations. Denies any numbness or tingling in hands or feet. Denies any recent fevers, infections, or recent hospitalizations. Patient reports appetite at 0% and energy level at 0%.    REVIEW OF SYSTEMS:  Review of Systems  Constitutional: Positive for appetite change and fatigue.  Gastrointestinal: Positive for constipation.  All other systems reviewed and are negative.    PAST MEDICAL/SURGICAL HISTORY:  Past Medical History:  Diagnosis Date  . Arthritis    stiff knees  . Blood dyscrasia   . Cancer ([place])   . CVA (cerebral vascular accident) ([place])   . GERD (gastroesophageal reflux disease)   . H/O: CML (chronic myeloid leukemia)   . Hyperlipidemia   . Hyperlipidemia   . Hypertension   . Iron deficiency anemia 07/15/2018  . Obesity   . Pessary maintenance 07/01/2013  . Stroke ([place])    x 3   Past Surgical History:  Procedure Laterality Date  . ABDOMINAL HYSTERECTOMY    . BIOPSY  08/20/2018   Procedure: BIOPSY;  Surgeon: [name], MD;  [facility];  Service: Endoscopy;;  gastric  . CHOLECYSTECTOMY    . COLONOSCOPY  06/09/2002   OEV:[id number] hemorrhoids; otherwise normal rectum, colon   . COLONOSCOPY N/A 08/25/2013   HWE:XHBZJIR diverticulosis. Single colonic polyp-removed  s/p segmental biopsy and stool sample. random colon bx negative. +benign leiomyoma.  . cystocele/rectocele repair  2009  . ESOPHAGOGASTRODUODENOSCOPY  06/09/2002   BDZ:HGDJME upper gastrointestinal tract s/p  54-French Maloney dilator  . ESOPHAGOGASTRODUODENOSCOPY (EGD) WITH PROPOFOL N/A 08/20/2018   Procedure: ESOPHAGOGASTRODUODENOSCOPY (EGD) WITH PROPOFOL;  Surgeon: [name], MD;  [facility];  Service: Endoscopy;  Laterality: N/A;  9:30am  . FEMUR IM NAIL Right 04/12/2016   Procedure: INTRAMEDULLARY (IM) NAIL FEMORAL;  Surgeon: Nicholes Stairs, MD;  Location:  [facility];  Service: Orthopedics;  Laterality: Right;  . HERNIA REPAIR    . IR THORACENTESIS ASP PLEURAL SPACE W/IMG GUIDE  05/07/2018  . LOOP RECORDER INSERTION N/A 08/23/2016   Procedure: Loop Recorder Insertion;  Surgeon: Constance Haw, MD;  Location: [facility] CV [facility];  Service: Cardiovascular;  Laterality: N/A;  . LOOP RECORDER REMOVAL N/A 05/07/2018   Procedure: LOOP RECORDER REMOVAL;  Surgeon: Constance Haw, MD;  Location: [facility];  Service: Cardiovascular;  Laterality: N/A;  . Venia Minks DILATION N/A 08/20/2018   Procedure: Venia Minks DILATION;  Surgeon: [name], MD;  [facility];  Service: Endoscopy;  Laterality: N/A;  . OOPHORECTOMY    . PACEMAKER IMPLANT N/A 05/07/2018   Procedure: PACEMAKER IMPLANT;  Surgeon: Constance Haw, MD;  Location: [facility];  Service: Cardiovascular;  Laterality: N/A;  . TEE WITHOUT CARDIOVERSION N/A 08/23/2016   Procedure: TRANSESOPHAGEAL ECHOCARDIOGRAM (TEE);  Surgeon: Fay Records, MD;  Location: [facility] ENDOSCOPY;  Service: Cardiovascular;  Laterality: N/A;     SOCIAL HISTORY:  Social History   Socioeconomic History  . Marital status: Widowed    Spouse name: Not on file  . Number of children: 6  . Years of education: associate  . Highest education level: Not on file  Occupational History    Employer: RETIRED  Social Needs  . Financial resource strain: Not on file  . Food insecurity    Worry: Not on file    Inability: Not on file  . Transportation needs    Medical: Not on file    Non-medical: Not on file  Tobacco Use  . Smoking status: Never Smoker  . Smokeless tobacco: Never Used  Substance and Sexual Activity  . Alcohol use: No  . Drug use: No  . Sexual activity: Not Currently    Birth control/protection: Surgical    Comment: hyst  Lifestyle  . Physical activity    Days per week: Not on file    Minutes per session: Not on file  . Stress: Not on file  Relationships  . Social  Herbalist on phone: Not on file    Gets together: Not on file    Attends religious service: Not on file    Active member of club or organization: Not on file    Attends meetings of clubs or organizations: Not on file    Relationship status: Not on file  . Intimate partner violence    Fear of current or ex partner: Not on file    Emotionally abused: Not on file    Physically abused: Not on file    Forced sexual activity: Not on file  Other Topics Concern  . Not on file  Social History Narrative  . Not on file    FAMILY HISTORY:  Family History  Problem Relation Age of Onset  . Anuerysm Mother        deceased age 66, brain anuerysm  . Early death Mother 84  . Heart disease Father   . Hypertension Sister   . Obesity  Brother   . Hypertension Sister   . Arthritis Sister   . Other Son        cardiac arrest  . Heart disease Child 58       cardiac arrest  . Colon cancer Neg Hx     CURRENT MEDICATIONS:  Outpatient Encounter Medications as of 01/22/2019  Medication Sig  . amLODipine (NORVASC) 10 MG tablet Take 1 tablet (10 mg total) by mouth daily.  Marland Kitchen aspirin 325 MG tablet Take 325 mg by mouth daily.  Marland Kitchen atorvastatin (LIPITOR) 40 MG tablet Take 1 tablet (40 mg total) by mouth daily at 6 PM.  . dasatinib (SPRYCEL) 100 MG tablet Take 1 tablet (100 mg total) by mouth daily.  . feeding supplement, ENSURE ENLIVE, (ENSURE ENLIVE) LIQD Take 237 mLs by mouth 2 (two) times daily between meals.  . hydrALAZINE (APRESOLINE) 25 MG tablet Take 1 tablet (25 mg total) by mouth 2 (two) times daily.  Marland Kitchen losartan (COZAAR) 100 MG tablet Take 1 tablet (100 mg total) by mouth daily. Resume on 3/28 (Patient taking differently: Take 100 mg by mouth daily. )  . Multiple Vitamin (MULTIVITAMIN WITH MINERALS) TABS tablet Take 1 tablet by mouth daily.  . Nutritional Supplements (BOOST PO) Take by mouth 2 (two) times daily.  . polyethylene glycol (MIRALAX / GLYCOLAX) packet Take 17 g by mouth daily.   . sennosides-docusate sodium (SENOKOT-S) 8.6-50 MG tablet Take 1 tablet by mouth every evening.  Marland Kitchen acetaminophen (TYLENOL) 325 MG tablet Take 2 tablets (650 mg total) by mouth every 6 (six) hours as needed for mild pain (or temp > 37.5 C (99.5 F)). (Patient not taking: Reported on 01/22/2019)  . [DISCONTINUED] aspirin 325 MG tablet Take 1 tablet (325 mg total) by mouth daily.  . [DISCONTINUED] clotrimazole (LOTRIMIN) 1 % cream Apply to both feet and between toes twice daily for 4 weeks.  . [DISCONTINUED] mirtazapine (REMERON) 7.5 MG tablet Take 1 tablet (7.5 mg total) by mouth at bedtime.  . [DISCONTINUED] pantoprazole (PROTONIX) 40 MG tablet Take 40 mg by mouth daily.    No facility-administered encounter medications on file as of 01/22/2019.     ALLERGIES:  No Known Allergies   PHYSICAL EXAM:  ECOG Performance status: 1  Vitals:   01/22/19 0934  BP: (!) 114/56  Pulse: 86  Resp: 15  Temp: 97.9 F (36.6 C)  SpO2: 100%   Filed Weights   01/22/19 0934  Weight: 123 lb (55.8 kg)    Physical Exam Constitutional:      Appearance: Normal appearance. She is normal weight.  Cardiovascular:     Rate and Rhythm: Normal rate and regular rhythm.     Heart sounds: Normal heart sounds.  Pulmonary:     Effort: Pulmonary effort is normal.     Breath sounds: Normal breath sounds.  Abdominal:     General: Bowel sounds are normal.     Palpations: Abdomen is soft.  Musculoskeletal: Normal range of motion.  Skin:    General: Skin is warm.  Neurological:     Mental Status: She is alert and oriented to person, place, and time. Mental status is at baseline.  Psychiatric:        Mood and Affect: Mood normal.        Behavior: Behavior normal.        Thought Content: Thought content normal.        Judgment: Judgment normal.      LABORATORY DATA:  I have reviewed the  labs as listed.  CBC    Component Value Date/Time   WBC 5.2 01/14/2019 1324   RBC 3.61 (L) 01/14/2019 1324   HGB  10.6 (L) 01/14/2019 1324   HCT 33.7 (L) 01/14/2019 1324   PLT 250 01/14/2019 1324   MCV 93.4 01/14/2019 1324   MCH 29.4 01/14/2019 1324   MCHC 31.5 01/14/2019 1324   RDW 15.2 01/14/2019 1324   LYMPHSABS 2.6 01/14/2019 1324   MONOABS 0.6 01/14/2019 1324   EOSABS 0.0 01/14/2019 1324   BASOSABS 0.0 01/14/2019 1324   CMP Latest Ref Rng & Units 01/14/2019 10/14/2018 08/14/2018  Glucose 70 - 99 mg/dL 110(H) 94 103(H)  BUN 8 - 23 mg/dL 55(H) 27(H) 24(H)  Creatinine 0.44 - 1.00 mg/dL 1.82(H) 1.47(H) 1.53(H)  Sodium 135 - 145 mmol/L 137 141 141  Potassium 3.5 - 5.1 mmol/L 4.5 5.0 5.1  Chloride 98 - 111 mmol/L 104 107 108  CO2 22 - 32 mmol/L '23 23 25  ' Calcium 8.9 - 10.3 mg/dL 8.9 9.1 8.9  Total Protein 6.5 - 8.1 g/dL 7.6 7.5 -  Total Bilirubin 0.3 - 1.2 mg/dL 0.3 0.4 -  Alkaline Phos 38 - 126 U/L 109 158(H) -  AST 15 - 41 U/L 37 49(H) -  ALT 0 - 44 U/L 32 44 -      I personally performed a face-to-face visit.  All questions were answered to patient's stated satisfaction. Encouraged patient to call with any new concerns or questions before his next visit to the cancer center and we can certain see him sooner, if needed.     ASSESSMENT & PLAN:   Chronic myelogenous leukemia (CML), BCR-ABL1-positive ([place]) 1.  Chronic myelogenous leukemia (CML), BCR-ABL1-positive ([place]): -CML in chronic phase. -Diagnosed in March 2016 -Was on Nilotinib 300 mg twice daily started in May 2016. -It was discontinued in July 2018 secondary to recurrent strokes. -Dasatinib 2 mg daily started in February 2019.  Patient reportedly tolerating therapy well.   -Labs done on 01/14/2019 showed WBC 12.2, hemoglobin 10.6, platelets 250, potassium 4.5, creatinine 1.82.  Ferritin 224 percent saturation 15, and BCR/ABL is negative -We will continue the Dasatinib at this time. -She denies any shortness of breath, easy bruising, night sweats, fevers, chills, or unexplained weight loss. -We will follow-up in 3 months with repeat  labs  2.  Decreased appetite: - Patient has lost over 20 pounds since February of this year. -She reports she feels hungry however her taste is gone. -She is drinking 2-3 ensures daily. -She is no longer taking the Remeron due to it not helping her appetite.  3.  Bradycardia: - Patient was noted to have Mobitz 2 AV block. -Pacemaker placed when patient was admitted 05/06/2020 05/08/2018. -Heart rate today is 24 -Patient will follow-up with cardiology as directed.       Orders placed this encounter:  Orders Placed This Encounter  Procedures  . Lactate dehydrogenase  . CBC with Differential/Platelet  . Comprehensive metabolic panel  . Ferritin  . Iron and TIBC  . Vitamin B12  . Vitamin D 25 hydroxy  . BCR-ABL1, CML/ALL, PCR, QUANT      Francene Finders, FNP-C [facility] [phone]

## 2019-01-22 NOTE — Patient Instructions (Signed)
[facility] at [facility] Discharge Instructions  Follow up in 3 months with lab s   Thank you for choosing [facility] at [facility] to provide your oncology and hematology care.  To afford each patient quality time with our provider, please arrive at least 15 minutes before your scheduled appointment time.   If you have a lab appointment with the Cancer Center please come in thru the Main Entrance and check in at the main information desk.  You need to re-schedule your appointment should you arrive 10 or more minutes late.  We strive to give you quality time with our providers, and arriving late affects you and other patients whose appointments are after yours.  Also, if you no show three or more times for appointments you may be dismissed from the clinic at the providers discretion.     Again, thank you for choosing [facility].  Our hope is that these requests will decrease the amount of time that you wait before being seen by our physicians.       _____________________________________________________________  Should you have questions after your visit to [facility], please contact our office at [phone] between the hours of 8:00 a.m. and 4:30 p.m.  Voicemails left after 4:00 p.m. will not be returned until the following business day.  For prescription refill requests, have your pharmacy contact our office and allow 72 hours.    Due to Covid, you will need to wear a mask upon entering the hospital. If you do not have a mask, a mask will be given to you at the Main Entrance upon arrival. For doctor visits, patients may have 1 support person with them. For treatment visits, patients can not have anyone with them due to social distancing guidelines and our immunocompromised population.      

## 2019-02-03 ENCOUNTER — Ambulatory Visit (INDEPENDENT_AMBULATORY_CARE_PROVIDER_SITE_OTHER): Payer: Medicare HMO | Admitting: *Deleted

## 2019-02-03 DIAGNOSIS — R001 Bradycardia, unspecified: Secondary | ICD-10-CM

## 2019-02-03 LAB — CUP PACEART REMOTE DEVICE CHECK
Battery Remaining Longevity: 152 mo
Battery Voltage: 3.12 V
Brady Statistic AP VP Percent: 1.45 %
Brady Statistic AP VS Percent: 0 %
Brady Statistic AS VP Percent: 98.5 %
Brady Statistic AS VS Percent: 0.04 %
Brady Statistic RA Percent Paced: 1.45 %
Brady Statistic RV Percent Paced: 99.96 %
Date Time Interrogation Session: 20201125002302
Implantable Lead Implant Date: 20200227
Implantable Lead Implant Date: 20200227
Implantable Lead Location: 753859
Implantable Lead Location: 753860
Implantable Lead Model: 5076
Implantable Lead Model: 5076
Implantable Pulse Generator Implant Date: 20200227
Lead Channel Impedance Value: 228 Ohm
Lead Channel Impedance Value: 323 Ohm
Lead Channel Impedance Value: 342 Ohm
Lead Channel Impedance Value: 475 Ohm
Lead Channel Pacing Threshold Amplitude: 0.5 V
Lead Channel Pacing Threshold Amplitude: 0.5 V
Lead Channel Pacing Threshold Pulse Width: 0.4 ms
Lead Channel Pacing Threshold Pulse Width: 0.4 ms
Lead Channel Sensing Intrinsic Amplitude: 1.5 mV
Lead Channel Sensing Intrinsic Amplitude: 1.5 mV
Lead Channel Sensing Intrinsic Amplitude: 10.25 mV
Lead Channel Sensing Intrinsic Amplitude: 10.25 mV
Lead Channel Setting Pacing Amplitude: 1.5 V
Lead Channel Setting Pacing Amplitude: 2 V
Lead Channel Setting Pacing Pulse Width: 0.4 ms
Lead Channel Setting Sensing Sensitivity: 2 mV

## 2019-02-19 ENCOUNTER — Ambulatory Visit: Payer: Medicare HMO | Admitting: Gastroenterology

## 2019-03-01 ENCOUNTER — Encounter: Payer: Self-pay | Admitting: Gastroenterology

## 2019-03-01 NOTE — Progress Notes (Deleted)
Primary Care Physician: Redmond School, MD  Primary Gastroenterologist:  Garfield Cornea, MD   No chief complaint on file.   HPI: April Page is a 73 y.o. female here for follow up of dysphagia and weight loss.   EGD in 08/2018 with erosive reflux esophagitis, s/p esophageal dilation, gastritis (neg for H.pylori).   Current Outpatient Medications  Medication Sig Dispense Refill  . acetaminophen (TYLENOL) 325 MG tablet Take 2 tablets (650 mg total) by mouth every 6 (six) hours as needed for mild pain (or temp > 37.5 C (99.5 F)). (Patient not taking: Reported on 01/22/2019)    . amLODipine (NORVASC) 10 MG tablet Take 1 tablet (10 mg total) by mouth daily. 30 tablet 0  . aspirin 325 MG tablet Take 325 mg by mouth daily.    Marland Kitchen atorvastatin (LIPITOR) 40 MG tablet Take 1 tablet (40 mg total) by mouth daily at 6 PM. 30 tablet 0  . dasatinib (SPRYCEL) 100 MG tablet Take 1 tablet (100 mg total) by mouth daily. 30 tablet 11  . feeding supplement, ENSURE ENLIVE, (ENSURE ENLIVE) LIQD Take 237 mLs by mouth 2 (two) times daily between meals. 237 mL 12  . hydrALAZINE (APRESOLINE) 25 MG tablet Take 1 tablet (25 mg total) by mouth 2 (two) times daily. 60 tablet 0  . losartan (COZAAR) 100 MG tablet Take 1 tablet (100 mg total) by mouth daily. Resume on 3/28 (Patient taking differently: Take 100 mg by mouth daily. ) 30 tablet 0  . Multiple Vitamin (MULTIVITAMIN WITH MINERALS) TABS tablet Take 1 tablet by mouth daily.    . Nutritional Supplements (BOOST PO) Take by mouth 2 (two) times daily.    . polyethylene glycol (MIRALAX / GLYCOLAX) packet Take 17 g by mouth daily. 14 each 0  . sennosides-docusate sodium (SENOKOT-S) 8.6-50 MG tablet Take 1 tablet by mouth every evening.     No current facility-administered medications for this visit.    Allergies as of 03/02/2019  . (No Known Allergies)    ROS:  General: Negative for anorexia, weight loss, fever, chills, fatigue, weakness. ENT:  Negative for hoarseness, difficulty swallowing , nasal congestion. CV: Negative for chest pain, angina, palpitations, dyspnea on exertion, peripheral edema.  Respiratory: Negative for dyspnea at rest, dyspnea on exertion, cough, sputum, wheezing.  GI: See history of present illness. GU:  Negative for dysuria, hematuria, urinary incontinence, urinary frequency, nocturnal urination.  Endo: Negative for unusual weight change.    Physical Examination:   There were no vitals taken for this visit.  General: Well-nourished, well-developed in no acute distress.  Eyes: No icterus. Mouth: Oropharyngeal mucosa moist and pink , no lesions erythema or exudate. Lungs: Clear to auscultation bilaterally.  Heart: Regular rate and rhythm, no murmurs rubs or gallops.  Abdomen: Bowel sounds are normal, nontender, nondistended, no hepatosplenomegaly or masses, no abdominal bruits or hernia , no rebound or guarding.   Extremities: No lower extremity edema. No clubbing or deformities. Neuro: Alert and oriented x 4   Skin: Warm and dry, no jaundice.   Psych: Alert and cooperative, normal mood and affect.  Labs:  Lab Results  Component Value Date   CREATININE 1.82 (H) 01/14/2019   BUN 55 (H) 01/14/2019   NA 137 01/14/2019   K 4.5 01/14/2019   CL 104 01/14/2019   CO2 23 01/14/2019   Lab Results  Component Value Date   ALT 32 01/14/2019   AST 37 01/14/2019   ALKPHOS 109 01/14/2019  BILITOT 0.3 01/14/2019   Lab Results  Component Value Date   WBC 5.2 01/14/2019   HGB 10.6 (L) 01/14/2019   HCT 33.7 (L) 01/14/2019   MCV 93.4 01/14/2019   PLT 250 01/14/2019   Lab Results  Component Value Date   TSH 1.431 05/06/2018     Lab Results  Component Value Date   XFFKVQOH00 979 01/14/2019   Lab Results  Component Value Date   FOLATE 17.8 01/14/2019   Lab Results  Component Value Date   IRON 48 01/14/2019   TIBC 319 01/14/2019   FERRITIN 224 01/14/2019    Imaging Studies: CUP PACEART  REMOTE DEVICE CHECK  Result Date: 02/03/2019 Scheduled remote reviewed.  Normal device function.  Next remote 91 days.  AManley

## 2019-03-02 ENCOUNTER — Ambulatory Visit: Payer: Medicare HMO | Admitting: Gastroenterology

## 2019-03-02 NOTE — Progress Notes (Signed)
PPM remote 

## 2019-03-24 MED FILL — SPRYCEL 100 MG TABLET: 100 | 30 days supply | Qty: 30 | Fill #9

## 2019-04-01 ENCOUNTER — Other Ambulatory Visit (HOSPITAL_COMMUNITY): Payer: Medicare HMO

## 2019-04-14 ENCOUNTER — Ambulatory Visit: Payer: Medicare HMO | Admitting: Podiatry

## 2019-04-16 ENCOUNTER — Telehealth (HOSPITAL_COMMUNITY): Payer: Self-pay | Admitting: Pharmacy Technician

## 2019-04-16 NOTE — Telephone Encounter (Signed)
Oral Oncology Patient Advocate Encounter  Was successful in securing patient a $15,000 grant from [facility] to provide copayment coverage for Sprycel.  This will keep the out of pocket expense at $0.     [employer] ID: [id number]  I have spoken with the patient.   The billing information is as follows and has been shared with [facility].    RxBin: [id number] PCN: [id number] Member ID: [id number] Group ID: [id number] Dates of Eligibility: 03/17/19 through 03/15/20  Fund:  [place] [facility] [place] Phone [phone] Fax [phone] 04/16/2019 2:47 PM

## 2019-04-22 ENCOUNTER — Other Ambulatory Visit (HOSPITAL_COMMUNITY): Payer: Medicare HMO

## 2019-04-26 ENCOUNTER — Other Ambulatory Visit (HOSPITAL_COMMUNITY)
Admission: RE | Admit: 2019-04-26 | Discharge: 2019-04-26 | Disposition: A | Discharge status: Home / Self Care | Payer: Medicare HMO | Source: Ambulatory Visit | Attending: Internal Medicine | Admitting: Internal Medicine

## 2019-04-26 ENCOUNTER — Other Ambulatory Visit: Payer: Self-pay

## 2019-04-26 ENCOUNTER — Inpatient Hospital Stay (HOSPITAL_COMMUNITY): Payer: Medicare HMO | Attending: Hematology

## 2019-04-26 DIAGNOSIS — D638 Anemia in other chronic diseases classified elsewhere: Secondary | ICD-10-CM | POA: Diagnosis not present

## 2019-04-26 DIAGNOSIS — I5032 Chronic diastolic (congestive) heart failure: Secondary | ICD-10-CM | POA: Diagnosis not present

## 2019-04-26 DIAGNOSIS — Z79899 Other long term (current) drug therapy: Secondary | ICD-10-CM | POA: Diagnosis not present

## 2019-04-26 DIAGNOSIS — C9210 Chronic myeloid leukemia, BCR/ABL-positive, not having achieved remission: Secondary | ICD-10-CM | POA: Diagnosis not present

## 2019-04-26 DIAGNOSIS — Z6820 Body mass index (BMI) 20.0-20.9, adult: Secondary | ICD-10-CM | POA: Insufficient documentation

## 2019-04-26 DIAGNOSIS — E559 Vitamin D deficiency, unspecified: Secondary | ICD-10-CM | POA: Diagnosis not present

## 2019-04-26 DIAGNOSIS — R634 Abnormal weight loss: Secondary | ICD-10-CM | POA: Diagnosis not present

## 2019-04-26 DIAGNOSIS — E119 Type 2 diabetes mellitus without complications: Secondary | ICD-10-CM | POA: Diagnosis not present

## 2019-04-26 DIAGNOSIS — I129 Hypertensive chronic kidney disease with stage 1 through stage 4 chronic kidney disease, or unspecified chronic kidney disease: Secondary | ICD-10-CM | POA: Diagnosis not present

## 2019-04-26 DIAGNOSIS — Z0001 Encounter for general adult medical examination with abnormal findings: Secondary | ICD-10-CM | POA: Insufficient documentation

## 2019-04-26 DIAGNOSIS — N184 Chronic kidney disease, stage 4 (severe): Secondary | ICD-10-CM | POA: Diagnosis not present

## 2019-04-26 DIAGNOSIS — Z1389 Encounter for screening for other disorder: Secondary | ICD-10-CM | POA: Diagnosis not present

## 2019-04-26 LAB — CBC WITH DIFFERENTIAL/PLATELET
Abs Immature Granulocytes: 0 10*3/uL (ref 0.00–0.07)
Basophils Absolute: 0 10*3/uL (ref 0.0–0.1)
Basophils Relative: 0 %
Eosinophils Absolute: 0 10*3/uL (ref 0.0–0.5)
Eosinophils Relative: 0 %
HCT: 35.1 % — ABNORMAL LOW (ref 36.0–46.0)
Hemoglobin: 10.6 g/dL — ABNORMAL LOW (ref 12.0–15.0)
Immature Granulocytes: 0 %
Lymphocytes Relative: 53 %
Lymphs Abs: 2 10*3/uL (ref 0.7–4.0)
MCH: 29 pg (ref 26.0–34.0)
MCHC: 30.2 g/dL (ref 30.0–36.0)
MCV: 96.2 fL (ref 80.0–100.0)
Monocytes Absolute: 0.3 10*3/uL (ref 0.1–1.0)
Monocytes Relative: 7 %
Neutro Abs: 1.5 10*3/uL — ABNORMAL LOW (ref 1.7–7.7)
Neutrophils Relative %: 40 %
Platelets: 245 10*3/uL (ref 150–400)
RBC: 3.65 MIL/uL — ABNORMAL LOW (ref 3.87–5.11)
RDW: 15.5 % (ref 11.5–15.5)
WBC: 3.8 10*3/uL — ABNORMAL LOW (ref 4.0–10.5)
nRBC: 0 % (ref 0.0–0.2)

## 2019-04-26 LAB — COMPREHENSIVE METABOLIC PANEL
ALT: 31 U/L (ref 0–44)
AST: 42 U/L — ABNORMAL HIGH (ref 15–41)
Albumin: 3.7 g/dL (ref 3.5–5.0)
Alkaline Phosphatase: 102 U/L (ref 38–126)
Anion gap: 10 (ref 5–15)
BUN: 41 mg/dL — ABNORMAL HIGH (ref 8–23)
CO2: 23 mmol/L (ref 22–32)
Calcium: 8.9 mg/dL (ref 8.9–10.3)
Chloride: 104 mmol/L (ref 98–111)
Creatinine, Ser: 1.79 mg/dL — ABNORMAL HIGH (ref 0.44–1.00)
GFR calc Af Amer: 32 mL/min — ABNORMAL LOW (ref >60)
GFR calc non Af Amer: 27 mL/min — ABNORMAL LOW (ref >60)
Glucose, Bld: 93 mg/dL (ref 70–99)
Potassium: 5.4 mmol/L — ABNORMAL HIGH (ref 3.5–5.1)
Sodium: 137 mmol/L (ref 135–145)
Total Bilirubin: 0.4 mg/dL (ref 0.3–1.2)
Total Protein: 7.7 g/dL (ref 6.5–8.1)

## 2019-04-26 LAB — LIPID PANEL
Cholesterol: 164 mg/dL (ref 0–200)
HDL: 46 mg/dL (ref >40)
LDL Cholesterol: 89 mg/dL (ref 0–99)
Total CHOL/HDL Ratio: 3.6 RATIO
Triglycerides: 145 mg/dL (ref <150)
VLDL: 29 mg/dL (ref 0–40)

## 2019-04-26 LAB — IRON AND TIBC
Iron: 33 ug/dL (ref 28–170)
Saturation Ratios: 10 % — ABNORMAL LOW (ref 10.4–31.8)
TIBC: 316 ug/dL (ref 250–450)
UIBC: 283 ug/dL

## 2019-04-26 LAB — LACTATE DEHYDROGENASE: LDH: 258 U/L — ABNORMAL HIGH (ref 98–192)

## 2019-04-26 LAB — VITAMIN B12: Vitamin B-12: 928 pg/mL — ABNORMAL HIGH (ref 180–914)

## 2019-04-26 LAB — TSH: TSH: 1.174 u[IU]/mL (ref 0.350–4.500)

## 2019-04-26 LAB — FERRITIN: Ferritin: 198 ng/mL (ref 11–307)

## 2019-04-26 LAB — VITAMIN D 25 HYDROXY (VIT D DEFICIENCY, FRACTURES): Vit D, 25-Hydroxy: 28.17 ng/mL — ABNORMAL LOW (ref 30–100)

## 2019-04-29 ENCOUNTER — Ambulatory Visit (HOSPITAL_COMMUNITY): Payer: Medicare HMO | Admitting: Nurse Practitioner

## 2019-04-30 ENCOUNTER — Other Ambulatory Visit: Payer: Self-pay | Admitting: Nephrology

## 2019-04-30 ENCOUNTER — Other Ambulatory Visit (HOSPITAL_COMMUNITY): Payer: Self-pay | Admitting: Nurse Practitioner

## 2019-04-30 ENCOUNTER — Other Ambulatory Visit (HOSPITAL_COMMUNITY): Payer: Self-pay | Admitting: Nephrology

## 2019-04-30 DIAGNOSIS — E875 Hyperkalemia: Secondary | ICD-10-CM

## 2019-04-30 DIAGNOSIS — N184 Chronic kidney disease, stage 4 (severe): Secondary | ICD-10-CM

## 2019-04-30 MED ORDER — SODIUM POLYSTYRENE SULFONATE 15 GM/60ML PO SUSP: 45.0000 g | Freq: Once | ORAL | 0 refills | Status: AC

## 2019-04-30 NOTE — Progress Notes (Signed)
Potassium 5.4 45 g of Kayexalate called into her pharmacy.  Patient and daughter aware

## 2019-05-03 LAB — BCR-ABL1, CML/ALL, PCR, QUANT: Interpretation (BCRAL):: NEGATIVE

## 2019-05-03 MED FILL — SPRYCEL 100 MG TABLET: 100 | 30 days supply | Qty: 30 | Fill #10

## 2019-05-05 ENCOUNTER — Ambulatory Visit (INDEPENDENT_AMBULATORY_CARE_PROVIDER_SITE_OTHER): Payer: Medicare HMO | Admitting: *Deleted

## 2019-05-05 DIAGNOSIS — R001 Bradycardia, unspecified: Secondary | ICD-10-CM | POA: Diagnosis not present

## 2019-05-05 LAB — CUP PACEART REMOTE DEVICE CHECK
Battery Remaining Longevity: 149 mo
Battery Voltage: 3.07 V
Brady Statistic AP VP Percent: 1.25 %
Brady Statistic AP VS Percent: 0.41 %
Brady Statistic AS VP Percent: 49.29 %
Brady Statistic AS VS Percent: 49.05 %
Brady Statistic RA Percent Paced: 1.67 %
Brady Statistic RV Percent Paced: 50.54 %
Date Time Interrogation Session: 20210224025119
Implantable Lead Implant Date: 20200227
Implantable Lead Implant Date: 20200227
Implantable Lead Location: 753859
Implantable Lead Location: 753860
Implantable Lead Model: 5076
Implantable Lead Model: 5076
Implantable Pulse Generator Implant Date: 20200227
Lead Channel Impedance Value: 228 Ohm
Lead Channel Impedance Value: 342 Ohm
Lead Channel Impedance Value: 380 Ohm
Lead Channel Impedance Value: 437 Ohm
Lead Channel Pacing Threshold Amplitude: 0.5 V
Lead Channel Pacing Threshold Amplitude: 0.625 V
Lead Channel Pacing Threshold Pulse Width: 0.4 ms
Lead Channel Pacing Threshold Pulse Width: 0.4 ms
Lead Channel Sensing Intrinsic Amplitude: 1.75 mV
Lead Channel Sensing Intrinsic Amplitude: 1.75 mV
Lead Channel Sensing Intrinsic Amplitude: 10.25 mV
Lead Channel Sensing Intrinsic Amplitude: 10.25 mV
Lead Channel Setting Pacing Amplitude: 1.5 V
Lead Channel Setting Pacing Amplitude: 2 V
Lead Channel Setting Pacing Pulse Width: 0.4 ms
Lead Channel Setting Sensing Sensitivity: 2 mV

## 2019-05-06 ENCOUNTER — Ambulatory Visit (HOSPITAL_COMMUNITY): Payer: Medicare HMO | Admitting: Nurse Practitioner

## 2019-05-06 NOTE — Progress Notes (Signed)
PPM Remote  

## 2019-05-10 ENCOUNTER — Encounter: Payer: Self-pay | Admitting: Internal Medicine

## 2019-05-12 ENCOUNTER — Other Ambulatory Visit (HOSPITAL_COMMUNITY): Payer: Self-pay | Admitting: *Deleted

## 2019-05-12 DIAGNOSIS — E875 Hyperkalemia: Secondary | ICD-10-CM

## 2019-05-12 DIAGNOSIS — C9210 Chronic myeloid leukemia, BCR/ABL-positive, not having achieved remission: Secondary | ICD-10-CM

## 2019-05-13 ENCOUNTER — Inpatient Hospital Stay (HOSPITAL_COMMUNITY): Payer: Medicare HMO

## 2019-05-13 ENCOUNTER — Ambulatory Visit (HOSPITAL_COMMUNITY)
Admission: RE | Admit: 2019-05-13 | Discharge: 2019-05-13 | Disposition: A | Discharge status: Home / Self Care | Payer: Medicare HMO | Source: Ambulatory Visit | Attending: Nephrology | Admitting: Nephrology

## 2019-05-13 ENCOUNTER — Other Ambulatory Visit: Payer: Self-pay

## 2019-05-13 ENCOUNTER — Inpatient Hospital Stay (HOSPITAL_COMMUNITY): Payer: Medicare HMO | Attending: Nurse Practitioner | Admitting: Nurse Practitioner

## 2019-05-13 DIAGNOSIS — I10 Essential (primary) hypertension: Secondary | ICD-10-CM | POA: Diagnosis not present

## 2019-05-13 DIAGNOSIS — E875 Hyperkalemia: Secondary | ICD-10-CM

## 2019-05-13 DIAGNOSIS — R001 Bradycardia, unspecified: Secondary | ICD-10-CM | POA: Diagnosis not present

## 2019-05-13 DIAGNOSIS — C9210 Chronic myeloid leukemia, BCR/ABL-positive, not having achieved remission: Secondary | ICD-10-CM

## 2019-05-13 DIAGNOSIS — N184 Chronic kidney disease, stage 4 (severe): Secondary | ICD-10-CM | POA: Diagnosis not present

## 2019-05-13 DIAGNOSIS — Z90710 Acquired absence of both cervix and uterus: Secondary | ICD-10-CM | POA: Diagnosis not present

## 2019-05-13 DIAGNOSIS — E669 Obesity, unspecified: Secondary | ICD-10-CM | POA: Diagnosis not present

## 2019-05-13 DIAGNOSIS — Z7982 Long term (current) use of aspirin: Secondary | ICD-10-CM | POA: Insufficient documentation

## 2019-05-13 DIAGNOSIS — Z79899 Other long term (current) drug therapy: Secondary | ICD-10-CM | POA: Diagnosis not present

## 2019-05-13 DIAGNOSIS — E785 Hyperlipidemia, unspecified: Secondary | ICD-10-CM | POA: Diagnosis not present

## 2019-05-13 DIAGNOSIS — D509 Iron deficiency anemia, unspecified: Secondary | ICD-10-CM | POA: Diagnosis not present

## 2019-05-13 DIAGNOSIS — Z8673 Personal history of transient ischemic attack (TIA), and cerebral infarction without residual deficits: Secondary | ICD-10-CM | POA: Insufficient documentation

## 2019-05-13 DIAGNOSIS — Z950 Presence of cardiac pacemaker: Secondary | ICD-10-CM | POA: Insufficient documentation

## 2019-05-13 LAB — COMPREHENSIVE METABOLIC PANEL
ALT: 28 U/L (ref 0–44)
AST: 41 U/L (ref 15–41)
Albumin: 3.6 g/dL (ref 3.5–5.0)
Alkaline Phosphatase: 100 U/L (ref 38–126)
Anion gap: 9 (ref 5–15)
BUN: 41 mg/dL — ABNORMAL HIGH (ref 8–23)
CO2: 23 mmol/L (ref 22–32)
Calcium: 8.8 mg/dL — ABNORMAL LOW (ref 8.9–10.3)
Chloride: 107 mmol/L (ref 98–111)
Creatinine, Ser: 1.61 mg/dL — ABNORMAL HIGH (ref 0.44–1.00)
GFR calc Af Amer: 36 mL/min — ABNORMAL LOW (ref >60)
GFR calc non Af Amer: 31 mL/min — ABNORMAL LOW (ref >60)
Glucose, Bld: 96 mg/dL (ref 70–99)
Potassium: 5.2 mmol/L — ABNORMAL HIGH (ref 3.5–5.1)
Sodium: 139 mmol/L (ref 135–145)
Total Bilirubin: 0.4 mg/dL (ref 0.3–1.2)
Total Protein: 7.3 g/dL (ref 6.5–8.1)

## 2019-05-13 LAB — CBC WITH DIFFERENTIAL/PLATELET
Abs Immature Granulocytes: 0.01 10*3/uL (ref 0.00–0.07)
Basophils Absolute: 0 10*3/uL (ref 0.0–0.1)
Basophils Relative: 0 %
Eosinophils Absolute: 0 10*3/uL (ref 0.0–0.5)
Eosinophils Relative: 0 %
HCT: 34.1 % — ABNORMAL LOW (ref 36.0–46.0)
Hemoglobin: 10.2 g/dL — ABNORMAL LOW (ref 12.0–15.0)
Immature Granulocytes: 0 %
Lymphocytes Relative: 49 %
Lymphs Abs: 1.7 10*3/uL (ref 0.7–4.0)
MCH: 28.9 pg (ref 26.0–34.0)
MCHC: 29.9 g/dL — ABNORMAL LOW (ref 30.0–36.0)
MCV: 96.6 fL (ref 80.0–100.0)
Monocytes Absolute: 0.4 10*3/uL (ref 0.1–1.0)
Monocytes Relative: 11 %
Neutro Abs: 1.4 10*3/uL — ABNORMAL LOW (ref 1.7–7.7)
Neutrophils Relative %: 40 %
Platelets: 250 10*3/uL (ref 150–400)
RBC: 3.53 MIL/uL — ABNORMAL LOW (ref 3.87–5.11)
RDW: 15 % (ref 11.5–15.5)
WBC: 3.6 10*3/uL — ABNORMAL LOW (ref 4.0–10.5)
nRBC: 0 % (ref 0.0–0.2)

## 2019-05-13 LAB — LACTATE DEHYDROGENASE: LDH: 275 U/L — ABNORMAL HIGH (ref 98–192)

## 2019-05-13 NOTE — Progress Notes (Signed)
[address] [place], [place] [id number]   CLINIC:  Medical Oncology/Hematology  PCP:  Redmond School, [address] [place] [address] [phone]   REASON FOR VISIT: Follow-up for CML  CURRENT THERAPY: Dasatinib  BRIEF ONCOLOGIC HISTORY:  Oncology History  Chronic myelogenous leukemia (CML), BCR-ABL1-positive ([place])  06/01/2014 Tumor Marker   Results for GEORGENE, KOPPER (MRN [id number]) as of 12/02/2014 09:30  06/01/2014 09:59 BCR ABL1 / ABL1 IS: 71.220 (H)    07/14/2014 Bone Marrow Biopsy   Bone Marrow, Aspirate,Biopsy, and Clot, right iliac - HYPERCELLULAR BONE MARROW WITH CHRONIC MYELOGENOUS LEUKEMIA. - SEE COMMENT. PERIPHERAL BLOOD: - CHRONIC MYELOGENOUS LEUKEMIA. Diagnosis Note Previous PCR analysis performed for bcr-abl1 is report   07/21/2014 - 08/04/2014 Chemotherapy   Tasigna 300 mg BID   08/04/2014 Miscellaneous   Ran out of 2 week sample secondary to issues with insurance approval.   08/15/2014 -  Chemotherapy   Tasigna 300 mg BID   09/17/2014 Tumor Marker   b3a2 transcript % 4.122    12/02/2014 Tumor Marker   b3a2 transcript: 0.046    03/31/2015 Tumor Marker   b3a2 transcript:  <0.001 %      03/31/2015 Remission   Complete molecular response, no detectable BCR/ABL using PCR assay   05/24/2016 Remission   NEGATIVE for the BCR-ABL1 e1a2 (p190), e13a2 (b2a2, p210) and e14a2 (b3a2, p210) fusion transcripts.     INTERVAL HISTORY:  Ms. April Page 74 y.o. female returns for routine follow-up for CML.  Patient reports she has been doing well since her last visit.  She does report her appetite is still poor.  Patient is still taking her medication as prescribed.  Her only side effect is her decreased appetite. Denies any nausea, vomiting, or diarrhea. Denies any new pains. Had not noticed any recent bleeding such as epistaxis, hematuria or hematochezia. Denies recent chest pain on exertion, shortness of breath on minimal exertion,  pre-syncopal episodes, or palpitations. Denies any numbness or tingling in hands or feet. Denies any recent fevers, infections, or recent hospitalizations. Patient reports appetite at 25% and energy level at 25%.     REVIEW OF SYSTEMS:  Review of Systems  Constitutional: Positive for appetite change and fatigue.  All other systems reviewed and are negative.    PAST MEDICAL/SURGICAL HISTORY:  Past Medical History:  Diagnosis Date  . Arthritis    stiff knees  . Blood dyscrasia   . Cancer ([place])   . CVA (cerebral vascular accident) ([place])   . GERD (gastroesophageal reflux disease)   . H/O: CML (chronic myeloid leukemia)   . Hyperlipidemia   . Hyperlipidemia   . Hypertension   . Iron deficiency anemia 07/15/2018  . Obesity   . Pessary maintenance 07/01/2013  . Stroke ([place])    x 3   Past Surgical History:  Procedure Laterality Date  . ABDOMINAL HYSTERECTOMY    . BIOPSY  08/20/2018   Procedure: BIOPSY;  Surgeon: [name], MD;  [facility];  Service: Endoscopy;;  gastric  . CHOLECYSTECTOMY    . COLONOSCOPY  06/09/2002   WCB:JSEGBTDV hemorrhoids; otherwise normal rectum, colon   . COLONOSCOPY N/A 08/25/2013   VOH:YWVPXTG diverticulosis. Single colonic polyp-removed  s/p segmental biopsy and stool sample. random colon bx negative. +benign leiomyoma.  . cystocele/rectocele repair  2009  . ESOPHAGOGASTRODUODENOSCOPY  06/09/2002   GYI:RSWNIO upper gastrointestinal tract s/p  54-French Maloney dilator  . ESOPHAGOGASTRODUODENOSCOPY (EGD) WITH PROPOFOL N/A 08/20/2018   Dr.  Rourk: erosive reflux esophagitis, gastritis but no h.pylori, hiatal hernia, s/p esophageal dilation  . FEMUR IM NAIL Right 04/12/2016   Procedure: INTRAMEDULLARY (IM) NAIL FEMORAL;  Surgeon: Nicholes Stairs, MD;  Location: [facility];  Service: Orthopedics;  Laterality: Right;  . HERNIA REPAIR    . IR THORACENTESIS ASP PLEURAL SPACE W/IMG GUIDE  05/07/2018  . LOOP RECORDER INSERTION N/A 08/23/2016    Procedure: Loop Recorder Insertion;  Surgeon: Constance Haw, MD;  Location: [facility];  Service: Cardiovascular;  Laterality: N/A;  . LOOP RECORDER REMOVAL N/A 05/07/2018   Procedure: LOOP RECORDER REMOVAL;  Surgeon: Constance Haw, MD;  Location: [facility];  Service: Cardiovascular;  Laterality: N/A;  . Venia Minks DILATION N/A 08/20/2018   Procedure: Venia Minks DILATION;  Surgeon: [name], MD;  [facility];  Service: Endoscopy;  Laterality: N/A;  . OOPHORECTOMY    . PACEMAKER IMPLANT N/A 05/07/2018   Procedure: PACEMAKER IMPLANT;  Surgeon: Constance Haw, MD;  Location: [facility];  Service: Cardiovascular;  Laterality: N/A;  . TEE WITHOUT CARDIOVERSION N/A 08/23/2016   Procedure: TRANSESOPHAGEAL ECHOCARDIOGRAM (TEE);  Surgeon: Fay Records, MD;  Location: [facility] ENDOSCOPY;  Service: Cardiovascular;  Laterality: N/A;     SOCIAL HISTORY:  Social History   Socioeconomic History  . Marital status: Widowed    Spouse name: Not on file  . Number of children: 6  . Years of education: associate  . Highest education level: Not on file  Occupational History    Employer: RETIRED  Tobacco Use  . Smoking status: Never Smoker  . Smokeless tobacco: Never Used  Substance and Sexual Activity  . Alcohol use: No  . Drug use: No  . Sexual activity: Not Currently    Birth control/protection: Surgical    Comment: hyst  Other Topics Concern  . Not on file  Social History Narrative  . Not on file   Social Determinants of Health   Financial Resource Strain:   . Difficulty of Paying Living Expenses: Not on file  Food Insecurity:   . Worried About Charity fundraiser in the Last Year: Not on file  . Ran Out of Food in the Last Year: Not on file  Transportation Needs:   . Lack of Transportation (Medical): Not on file  . Lack of Transportation (Non-Medical): Not on file  Physical Activity:   . Days of Exercise per Week: Not on file  . Minutes  of Exercise per Session: Not on file  Stress:   . Feeling of Stress : Not on file  Social Connections:   . Frequency of Communication with Friends and Family: Not on file  . Frequency of Social Gatherings with Friends and Family: Not on file  . Attends Religious Services: Not on file  . Active Member of Clubs or Organizations: Not on file  . Attends Archivist Meetings: Not on file  . Marital Status: Not on file  Intimate Partner Violence:   . Fear of Current or Ex-Partner: Not on file  . Emotionally Abused: Not on file  . Physically Abused: Not on file  . Sexually Abused: Not on file    FAMILY HISTORY:  Family History  Problem Relation Age of Onset  . Anuerysm Mother        deceased age 78, brain anuerysm  . Early death Mother 44  . Heart disease Father   . Hypertension Sister   . Obesity Brother   .  Hypertension Sister   . Arthritis Sister   . Other Son        cardiac arrest  . Heart disease Child 19       cardiac arrest  . Colon cancer Neg Hx     CURRENT MEDICATIONS:  Outpatient Encounter Medications as of 05/13/2019  Medication Sig  . acetaminophen (TYLENOL) 325 MG tablet Take 2 tablets (650 mg total) by mouth every 6 (six) hours as needed for mild pain (or temp > 37.5 C (99.5 F)). (Patient not taking: Reported on 01/22/2019)  . amLODipine (NORVASC) 10 MG tablet Take 1 tablet (10 mg total) by mouth daily.  Marland Kitchen aspirin 325 MG tablet Take 325 mg by mouth daily.  Marland Kitchen atorvastatin (LIPITOR) 40 MG tablet Take 1 tablet (40 mg total) by mouth daily at 6 PM.  . dasatinib (SPRYCEL) 100 MG tablet Take 1 tablet (100 mg total) by mouth daily.  . feeding supplement, ENSURE ENLIVE, (ENSURE ENLIVE) LIQD Take 237 mLs by mouth 2 (two) times daily between meals.  . hydrALAZINE (APRESOLINE) 25 MG tablet Take 1 tablet (25 mg total) by mouth 2 (two) times daily.  Marland Kitchen losartan (COZAAR) 100 MG tablet Take 1 tablet (100 mg total) by mouth daily. Resume on 3/28 (Patient taking differently:  Take 100 mg by mouth daily. )  . Multiple Vitamin (MULTIVITAMIN WITH MINERALS) TABS tablet Take 1 tablet by mouth daily.  . Nutritional Supplements (BOOST PO) Take by mouth 2 (two) times daily.  . polyethylene glycol (MIRALAX / GLYCOLAX) packet Take 17 g by mouth daily.  . sennosides-docusate sodium (SENOKOT-S) 8.6-50 MG tablet Take 1 tablet by mouth every evening.   No facility-administered encounter medications on file as of 05/13/2019.    ALLERGIES:  No Known Allergies  Vital signs: -Deferred due to telephone visit  Physical Exam -Deferred due to telephone visit -Patient was alert and oriented over the phone and in no acute distress  LABORATORY DATA:  I have reviewed the labs as listed.  CBC    Component Value Date/Time   WBC 3.6 (L) 05/13/2019 1448   RBC 3.53 (L) 05/13/2019 1448   HGB 10.2 (L) 05/13/2019 1448   HCT 34.1 (L) 05/13/2019 1448   PLT 250 05/13/2019 1448   MCV 96.6 05/13/2019 1448   MCH 28.9 05/13/2019 1448   MCHC 29.9 (L) 05/13/2019 1448   RDW 15.0 05/13/2019 1448   LYMPHSABS 1.7 05/13/2019 1448   MONOABS 0.4 05/13/2019 1448   EOSABS 0.0 05/13/2019 1448   BASOSABS 0.0 05/13/2019 1448   CMP Latest Ref Rng & Units 05/13/2019 04/26/2019 01/14/2019  Glucose 70 - 99 mg/dL 96 93 110(H)  BUN 8 - 23 mg/dL 41(H) 41(H) 55(H)  Creatinine 0.44 - 1.00 mg/dL 1.61(H) 1.79(H) 1.82(H)  Sodium 135 - 145 mmol/L 139 137 137  Potassium 3.5 - 5.1 mmol/L 5.2(H) 5.4(H) 4.5  Chloride 98 - 111 mmol/L 107 104 104  CO2 22 - 32 mmol/L '23 23 23  '$ Calcium 8.9 - 10.3 mg/dL 8.8(L) 8.9 8.9  Total Protein 6.5 - 8.1 g/dL 7.3 7.7 7.6  Total Bilirubin 0.3 - 1.2 mg/dL 0.4 0.4 0.3  Alkaline Phos 38 - 126 U/L 100 102 109  AST 15 - 41 U/L 41 42(H) 37  ALT 0 - 44 U/L 28 31 32     I personally performed a face-to-face visit.  All questions were answered to patient's stated satisfaction. Encouraged patient to call with any new concerns or questions before his next visit to the cancer  center and we  can certain see him sooner, if needed.     ASSESSMENT & PLAN:   Chronic myelogenous leukemia (CML), BCR-ABL1-positive ([place]) 1.  Chronic myelogenous leukemia (CML), BCR-ABL1-positive ([place]): -CML in chronic phase. -Diagnosed in March 2016 -Was on Nilotinib 300 mg twice daily started in May 2016. -It was discontinued in July 2018 secondary to recurrent strokes. -Dasatinib 2 mg daily started in February 2019.  Patient reportedly tolerating therapy well.   -Labs done on 04/26/2019 showed WBC 3.8, hemoglobin 10.6, platelets 245, potassium 5.4, creatinine 1.79.  Ferritin 198 percent saturation 10, and BCR/ABL is negative. -Labs done on 05/13/2019 we repeated potassium which is was down to 5.2. -We will continue the Dasatinib at this time. -She denies any shortness of breath, easy bruising, night sweats, fevers, chills, or unexplained weight loss. -We will set up to infusions of IV iron due to her anemia and decreased ferritin. -We will follow-up in 3 months with repeat labs  2.  Decreased appetite: - Patient has lost over 20 pounds since February of this year. -She reports she feels hungry however her taste is gone. -She is drinking 2-3 ensures daily. -She is no longer taking the Remeron due to it not helping her appetite.  3.  Bradycardia: - Patient was noted to have Mobitz 2 AV block. -Pacemaker placed when patient was admitted 05/06/2020 05/08/2018. -Heart rate today is 37 -Patient will follow-up with cardiology as directed.       Orders placed this encounter:  Orders Placed This Encounter  Procedures  . BCR-ABL1, CML/ALL, PCR, QUANT  . Lactate dehydrogenase  . CBC with Differential/Platelet  . Comprehensive metabolic panel  . Vitamin B12  . VITAMIN D 25 Hydroxy (Vit-D Deficiency, Fractures)  . Ferritin  . Iron and TIBC    I provided 19 minutes of non face-to-face telephone visit time during this encounter, and > 50% was spent counseling as documented under my assessment &  plan.  Francene Finders, FNP-C [place] [phone]

## 2019-05-13 NOTE — Assessment & Plan Note (Addendum)
1.  Chronic myelogenous leukemia (CML), BCR-ABL1-positive ([place]): -CML in chronic phase. -Diagnosed in March 2016 -Was on Nilotinib 300 mg twice daily started in May 2016. -It was discontinued in July 2018 secondary to recurrent strokes. -Dasatinib 2 mg daily started in February 2019.  Patient reportedly tolerating therapy well.   -Labs done on 04/26/2019 showed WBC 3.8, hemoglobin 10.6, platelets 245, potassium 5.4, creatinine 1.79.  Ferritin 198 percent saturation 10, and BCR/ABL is negative. -Labs done on 05/13/2019 we repeated potassium which is was down to 5.2. -We will continue the Dasatinib at this time. -She denies any shortness of breath, easy bruising, night sweats, fevers, chills, or unexplained weight loss. -We will set up to infusions of IV iron due to her anemia and decreased ferritin. -We will follow-up in 3 months with repeat labs  2.  Decreased appetite: - Patient has lost over 20 pounds since February of this year. -She reports she feels hungry however her taste is gone. -She is drinking 2-3 ensures daily. -She is no longer taking the Remeron due to it not helping her appetite.  3.  Bradycardia: - Patient was noted to have Mobitz 2 AV block. -Pacemaker placed when patient was admitted 05/06/2020 05/08/2018. -Heart rate today is 44 -Patient will follow-up with cardiology as directed.

## 2019-05-26 ENCOUNTER — Inpatient Hospital Stay (HOSPITAL_COMMUNITY): Payer: Medicare HMO

## 2019-05-26 ENCOUNTER — Encounter (HOSPITAL_COMMUNITY): Payer: Self-pay

## 2019-05-26 VITALS — BP 111/56 | HR 77 | Temp 96.8°F | Resp 16

## 2019-05-26 DIAGNOSIS — Z79899 Other long term (current) drug therapy: Secondary | ICD-10-CM | POA: Diagnosis not present

## 2019-05-26 DIAGNOSIS — I129 Hypertensive chronic kidney disease with stage 1 through stage 4 chronic kidney disease, or unspecified chronic kidney disease: Secondary | ICD-10-CM | POA: Diagnosis not present

## 2019-05-26 DIAGNOSIS — D508 Other iron deficiency anemias: Secondary | ICD-10-CM

## 2019-05-26 DIAGNOSIS — Z7982 Long term (current) use of aspirin: Secondary | ICD-10-CM | POA: Diagnosis not present

## 2019-05-26 DIAGNOSIS — E559 Vitamin D deficiency, unspecified: Secondary | ICD-10-CM | POA: Diagnosis not present

## 2019-05-26 DIAGNOSIS — C9210 Chronic myeloid leukemia, BCR/ABL-positive, not having achieved remission: Secondary | ICD-10-CM | POA: Diagnosis not present

## 2019-05-26 DIAGNOSIS — I5032 Chronic diastolic (congestive) heart failure: Secondary | ICD-10-CM | POA: Diagnosis not present

## 2019-05-26 DIAGNOSIS — N184 Chronic kidney disease, stage 4 (severe): Secondary | ICD-10-CM | POA: Diagnosis not present

## 2019-05-26 DIAGNOSIS — R001 Bradycardia, unspecified: Secondary | ICD-10-CM | POA: Diagnosis not present

## 2019-05-26 DIAGNOSIS — D638 Anemia in other chronic diseases classified elsewhere: Secondary | ICD-10-CM | POA: Diagnosis not present

## 2019-05-26 DIAGNOSIS — E669 Obesity, unspecified: Secondary | ICD-10-CM | POA: Diagnosis not present

## 2019-05-26 DIAGNOSIS — I10 Essential (primary) hypertension: Secondary | ICD-10-CM | POA: Diagnosis not present

## 2019-05-26 DIAGNOSIS — Z8673 Personal history of transient ischemic attack (TIA), and cerebral infarction without residual deficits: Secondary | ICD-10-CM | POA: Diagnosis not present

## 2019-05-26 DIAGNOSIS — D509 Iron deficiency anemia, unspecified: Secondary | ICD-10-CM | POA: Diagnosis not present

## 2019-05-26 DIAGNOSIS — E785 Hyperlipidemia, unspecified: Secondary | ICD-10-CM | POA: Diagnosis not present

## 2019-05-26 MED ORDER — SODIUM CHLORIDE 0.9 % IV SOLN: Freq: Once | INTRAVENOUS | Status: AC

## 2019-05-26 MED ORDER — SODIUM CHLORIDE 0.9 % IV SOLN
510.0000 mg | Freq: Once | INTRAVENOUS | Status: AC
  Administered 2019-05-26: 510 mg via INTRAVENOUS
  Filled 2019-05-26: qty 510

## 2019-05-26 NOTE — Progress Notes (Signed)
Patient presents today for Feraheme infusion. MAR reviewed and updated. Vital signs stable. Patient has no complaints of any changes since her last visit.   Treatment given today per MD orders. Tolerated infusion without adverse affects. Vital signs stable. No complaints at this time. Discharged from clinic via wheel chair. F/U with [facility] as scheduled.

## 2019-05-26 NOTE — Patient Instructions (Signed)
[facility]  Discharge Instructions:   _______________________________________________________________  Thank you for choosing [facility] to provide your oncology and hematology care.  To afford each patient quality time with our providers, please arrive at least 15 minutes before your scheduled appointment.  You need to re-schedule your appointment if you arrive 10 or more minutes late.  We strive to give you quality time with our providers, and arriving late affects you and other patients whose appointments are after yours.  Also, if you no show three or more times for appointments you may be dismissed from the clinic.  Again, thank you for choosing [facility] at [facility]. Our hope is that these requests will allow you access to exceptional care and in a timely manner. _______________________________________________________________  If you have questions after your visit, please contact our office at [phone] between the hours of 8:30 a.m. and 5:00 p.m. Voicemails left after 4:30 p.m. will not be returned until the following business day. _______________________________________________________________  For prescription refill requests, have your pharmacy contact our office. _______________________________________________________________  Recommendations made by the consultant and any test results will be sent to your referring physician. _______________________________________________________________ 

## 2019-05-29 ENCOUNTER — Ambulatory Visit: Payer: Medicare HMO

## 2019-06-02 ENCOUNTER — Ambulatory Visit (HOSPITAL_COMMUNITY): Payer: Medicare HMO

## 2019-06-04 ENCOUNTER — Other Ambulatory Visit (HOSPITAL_COMMUNITY): Payer: Self-pay | Admitting: Nephrology

## 2019-06-04 ENCOUNTER — Inpatient Hospital Stay (HOSPITAL_COMMUNITY): Payer: Medicare HMO

## 2019-06-04 ENCOUNTER — Other Ambulatory Visit: Payer: Self-pay

## 2019-06-04 ENCOUNTER — Encounter (HOSPITAL_COMMUNITY): Payer: Self-pay

## 2019-06-04 ENCOUNTER — Encounter: Payer: Self-pay | Admitting: Podiatry

## 2019-06-04 ENCOUNTER — Other Ambulatory Visit: Payer: Self-pay | Admitting: Nephrology

## 2019-06-04 ENCOUNTER — Ambulatory Visit: Payer: Medicare HMO | Admitting: Podiatry

## 2019-06-04 VITALS — BP 108/65 | HR 86 | Temp 96.9°F | Resp 18

## 2019-06-04 DIAGNOSIS — M79674 Pain in right toe(s): Secondary | ICD-10-CM

## 2019-06-04 DIAGNOSIS — M79675 Pain in left toe(s): Secondary | ICD-10-CM

## 2019-06-04 DIAGNOSIS — N184 Chronic kidney disease, stage 4 (severe): Secondary | ICD-10-CM

## 2019-06-04 DIAGNOSIS — D509 Iron deficiency anemia, unspecified: Secondary | ICD-10-CM | POA: Diagnosis not present

## 2019-06-04 DIAGNOSIS — B351 Tinea unguium: Secondary | ICD-10-CM

## 2019-06-04 DIAGNOSIS — I10 Essential (primary) hypertension: Secondary | ICD-10-CM | POA: Diagnosis not present

## 2019-06-04 DIAGNOSIS — E669 Obesity, unspecified: Secondary | ICD-10-CM | POA: Diagnosis not present

## 2019-06-04 DIAGNOSIS — R1903 Right lower quadrant abdominal swelling, mass and lump: Secondary | ICD-10-CM | POA: Diagnosis not present

## 2019-06-04 DIAGNOSIS — E118 Type 2 diabetes mellitus with unspecified complications: Secondary | ICD-10-CM | POA: Diagnosis not present

## 2019-06-04 DIAGNOSIS — Z79899 Other long term (current) drug therapy: Secondary | ICD-10-CM | POA: Diagnosis not present

## 2019-06-04 DIAGNOSIS — Z8673 Personal history of transient ischemic attack (TIA), and cerebral infarction without residual deficits: Secondary | ICD-10-CM | POA: Diagnosis not present

## 2019-06-04 DIAGNOSIS — Z7982 Long term (current) use of aspirin: Secondary | ICD-10-CM | POA: Diagnosis not present

## 2019-06-04 DIAGNOSIS — C9210 Chronic myeloid leukemia, BCR/ABL-positive, not having achieved remission: Secondary | ICD-10-CM | POA: Diagnosis not present

## 2019-06-04 DIAGNOSIS — T300 Burn of unspecified body region, unspecified degree: Secondary | ICD-10-CM

## 2019-06-04 DIAGNOSIS — I5032 Chronic diastolic (congestive) heart failure: Secondary | ICD-10-CM | POA: Diagnosis not present

## 2019-06-04 DIAGNOSIS — D508 Other iron deficiency anemias: Secondary | ICD-10-CM

## 2019-06-04 DIAGNOSIS — E875 Hyperkalemia: Secondary | ICD-10-CM | POA: Diagnosis not present

## 2019-06-04 DIAGNOSIS — E211 Secondary hyperparathyroidism, not elsewhere classified: Secondary | ICD-10-CM | POA: Diagnosis not present

## 2019-06-04 DIAGNOSIS — R001 Bradycardia, unspecified: Secondary | ICD-10-CM | POA: Diagnosis not present

## 2019-06-04 DIAGNOSIS — I129 Hypertensive chronic kidney disease with stage 1 through stage 4 chronic kidney disease, or unspecified chronic kidney disease: Secondary | ICD-10-CM | POA: Diagnosis not present

## 2019-06-04 DIAGNOSIS — D638 Anemia in other chronic diseases classified elsewhere: Secondary | ICD-10-CM | POA: Diagnosis not present

## 2019-06-04 DIAGNOSIS — E785 Hyperlipidemia, unspecified: Secondary | ICD-10-CM | POA: Diagnosis not present

## 2019-06-04 MED ORDER — SODIUM CHLORIDE 0.9 % IV SOLN
510.0000 mg | Freq: Once | INTRAVENOUS | Status: AC
  Administered 2019-06-04: 510 mg via INTRAVENOUS
  Filled 2019-06-04: qty 510

## 2019-06-04 MED ORDER — SODIUM CHLORIDE 0.9 % IV SOLN: INTRAVENOUS | Status: DC

## 2019-06-04 NOTE — Progress Notes (Signed)
Iron given per orders. Patient tolerated it well without problems. Vitals stable and discharged home from clinic via wheechair. Follow up as scheduled.  

## 2019-06-05 NOTE — Progress Notes (Signed)
This patient returns to my office for at risk foot care.  This patient requires this care by a professional since this patient will be at risk due to having diabetes type 2 and CVA.   This patient is unable to cut nails herself since the patient cannot reach her nails.These nails are painful walking and wearing shoes.  This patient presents for at risk foot care today. She presents to the office with her daughter.  Her daughter says her fifth toe left foot is painful since applying acid to the skin.  General Appearance  Alert, conversant and in no acute stress.  Vascular  Dorsalis pedis and posterior tibial  pulses are palpable  bilaterally.  Capillary return is within normal limits  bilaterally. Temperature is within normal limits  bilaterally.  Neurologic  Senn-Weinstein monofilament wire test within normal limits  bilaterally. Muscle power within normal limits bilaterally.  Nails Thick disfigured discolored nails with subungual debris  from hallux to fifth toes bilaterally. No evidence of bacterial infection or drainage bilaterally.  Orthopedic  No limitations of motion  feet .  No crepitus or effusions noted.  No bony pathology or digital deformities noted.  HAV  B/L.  Hammer toes 2-5  B/L.  Skin  normotropic skin with no porokeratosis noted bilaterally.  No signs of infections or ulcers noted.  Necrotic tissue fifth toe left foot.   Onychomycosis  Pain in right toes  Pain in left toes  Debride necrotic skin fifth toe left foot at proximal nail fold fifth toe left foot.  Consent was obtained for treatment procedures.   Mechanical debridement of nails 1-5  bilaterally performed with a nail nipper.  Filed with dremel without incident. Debride skin with # 15 blade.   Return office visit   3 months                  Told patient to return for periodic foot care and evaluation due to potential at risk complications.   Gardiner Barefoot DPM

## 2019-06-16 ENCOUNTER — Other Ambulatory Visit (HOSPITAL_COMMUNITY): Payer: Self-pay | Admitting: Nurse Practitioner

## 2019-06-16 DIAGNOSIS — C9210 Chronic myeloid leukemia, BCR/ABL-positive, not having achieved remission: Secondary | ICD-10-CM

## 2019-06-18 ENCOUNTER — Other Ambulatory Visit: Payer: Self-pay

## 2019-06-18 ENCOUNTER — Ambulatory Visit (HOSPITAL_COMMUNITY)
Admission: RE | Admit: 2019-06-18 | Discharge: 2019-06-18 | Disposition: A | Discharge status: Home / Self Care | Payer: Medicare HMO | Source: Ambulatory Visit | Attending: Nephrology | Admitting: Nephrology

## 2019-06-18 DIAGNOSIS — I5032 Chronic diastolic (congestive) heart failure: Secondary | ICD-10-CM | POA: Diagnosis not present

## 2019-06-18 DIAGNOSIS — E875 Hyperkalemia: Secondary | ICD-10-CM | POA: Diagnosis not present

## 2019-06-18 DIAGNOSIS — N184 Chronic kidney disease, stage 4 (severe): Secondary | ICD-10-CM

## 2019-06-18 DIAGNOSIS — E211 Secondary hyperparathyroidism, not elsewhere classified: Secondary | ICD-10-CM | POA: Diagnosis not present

## 2019-06-18 DIAGNOSIS — I129 Hypertensive chronic kidney disease with stage 1 through stage 4 chronic kidney disease, or unspecified chronic kidney disease: Secondary | ICD-10-CM | POA: Diagnosis not present

## 2019-06-18 DIAGNOSIS — D638 Anemia in other chronic diseases classified elsewhere: Secondary | ICD-10-CM | POA: Diagnosis not present

## 2019-06-21 MED FILL — SPRYCEL 100 MG TABLET: 100 | 30 days supply | Qty: 30 | Fill #0

## 2019-07-07 ENCOUNTER — Other Ambulatory Visit: Payer: Self-pay

## 2019-07-07 ENCOUNTER — Other Ambulatory Visit (HOSPITAL_COMMUNITY): Payer: Self-pay | Admitting: Nephrology

## 2019-07-07 ENCOUNTER — Ambulatory Visit (HOSPITAL_COMMUNITY)
Admission: RE | Admit: 2019-07-07 | Discharge: 2019-07-07 | Disposition: A | Discharge status: Home / Self Care | Payer: Medicare HMO | Source: Ambulatory Visit | Attending: Nephrology | Admitting: Nephrology

## 2019-07-07 DIAGNOSIS — E211 Secondary hyperparathyroidism, not elsewhere classified: Secondary | ICD-10-CM | POA: Diagnosis not present

## 2019-07-07 DIAGNOSIS — J90 Pleural effusion, not elsewhere classified: Secondary | ICD-10-CM

## 2019-07-07 DIAGNOSIS — I129 Hypertensive chronic kidney disease with stage 1 through stage 4 chronic kidney disease, or unspecified chronic kidney disease: Secondary | ICD-10-CM | POA: Diagnosis not present

## 2019-07-07 DIAGNOSIS — D638 Anemia in other chronic diseases classified elsewhere: Secondary | ICD-10-CM | POA: Diagnosis not present

## 2019-07-07 DIAGNOSIS — I5032 Chronic diastolic (congestive) heart failure: Secondary | ICD-10-CM | POA: Diagnosis not present

## 2019-07-07 DIAGNOSIS — N184 Chronic kidney disease, stage 4 (severe): Secondary | ICD-10-CM | POA: Diagnosis not present

## 2019-07-07 DIAGNOSIS — E559 Vitamin D deficiency, unspecified: Secondary | ICD-10-CM | POA: Diagnosis not present

## 2019-07-07 DIAGNOSIS — E875 Hyperkalemia: Secondary | ICD-10-CM | POA: Diagnosis not present

## 2019-07-09 ENCOUNTER — Other Ambulatory Visit: Payer: Self-pay | Admitting: Nephrology

## 2019-07-09 ENCOUNTER — Other Ambulatory Visit (HOSPITAL_COMMUNITY): Payer: Self-pay | Admitting: Nephrology

## 2019-07-09 ENCOUNTER — Other Ambulatory Visit (HOSPITAL_COMMUNITY)
Admission: RE | Admit: 2019-07-09 | Discharge: 2019-07-09 | Disposition: A | Discharge status: Home / Self Care | Payer: Medicare HMO | Source: Ambulatory Visit | Attending: Internal Medicine | Admitting: Internal Medicine

## 2019-07-09 ENCOUNTER — Other Ambulatory Visit: Payer: Self-pay

## 2019-07-09 DIAGNOSIS — E875 Hyperkalemia: Secondary | ICD-10-CM

## 2019-07-09 DIAGNOSIS — I5032 Chronic diastolic (congestive) heart failure: Secondary | ICD-10-CM

## 2019-07-09 DIAGNOSIS — Z20822 Contact with and (suspected) exposure to covid-19: Secondary | ICD-10-CM | POA: Insufficient documentation

## 2019-07-09 DIAGNOSIS — E211 Secondary hyperparathyroidism, not elsewhere classified: Secondary | ICD-10-CM

## 2019-07-09 DIAGNOSIS — N184 Chronic kidney disease, stage 4 (severe): Secondary | ICD-10-CM

## 2019-07-09 DIAGNOSIS — J90 Pleural effusion, not elsewhere classified: Secondary | ICD-10-CM

## 2019-07-09 DIAGNOSIS — D638 Anemia in other chronic diseases classified elsewhere: Secondary | ICD-10-CM

## 2019-07-10 LAB — SARS CORONAVIRUS 2 (TAT 6-24 HRS): SARS Coronavirus 2: NEGATIVE

## 2019-07-13 ENCOUNTER — Other Ambulatory Visit (HOSPITAL_COMMUNITY): Payer: Self-pay | Admitting: Student

## 2019-07-13 ENCOUNTER — Ambulatory Visit (HOSPITAL_COMMUNITY)
Admission: RE | Admit: 2019-07-13 | Discharge: 2019-07-13 | Disposition: A | Discharge status: Home / Self Care | Payer: Medicare HMO | Source: Ambulatory Visit | Attending: Student | Admitting: Student

## 2019-07-13 ENCOUNTER — Other Ambulatory Visit: Payer: Self-pay

## 2019-07-13 ENCOUNTER — Ambulatory Visit (HOSPITAL_COMMUNITY)
Admission: RE | Admit: 2019-07-13 | Discharge: 2019-07-13 | Disposition: A | Discharge status: Home / Self Care | Payer: Medicare HMO | Source: Ambulatory Visit | Attending: Nephrology | Admitting: Nephrology

## 2019-07-13 DIAGNOSIS — I130 Hypertensive heart and chronic kidney disease with heart failure and stage 1 through stage 4 chronic kidney disease, or unspecified chronic kidney disease: Secondary | ICD-10-CM | POA: Insufficient documentation

## 2019-07-13 DIAGNOSIS — D638 Anemia in other chronic diseases classified elsewhere: Secondary | ICD-10-CM

## 2019-07-13 DIAGNOSIS — D631 Anemia in chronic kidney disease: Secondary | ICD-10-CM | POA: Diagnosis not present

## 2019-07-13 DIAGNOSIS — E875 Hyperkalemia: Secondary | ICD-10-CM | POA: Insufficient documentation

## 2019-07-13 DIAGNOSIS — E211 Secondary hyperparathyroidism, not elsewhere classified: Secondary | ICD-10-CM | POA: Diagnosis not present

## 2019-07-13 DIAGNOSIS — I5032 Chronic diastolic (congestive) heart failure: Secondary | ICD-10-CM | POA: Diagnosis not present

## 2019-07-13 DIAGNOSIS — J90 Pleural effusion, not elsewhere classified: Secondary | ICD-10-CM | POA: Insufficient documentation

## 2019-07-13 DIAGNOSIS — N184 Chronic kidney disease, stage 4 (severe): Secondary | ICD-10-CM

## 2019-07-13 DIAGNOSIS — Z9889 Other specified postprocedural states: Secondary | ICD-10-CM

## 2019-07-13 HISTORY — PX: IR THORACENTESIS ASP PLEURAL SPACE W/IMG GUIDE: IMG5380

## 2019-07-13 LAB — BODY FLUID CELL COUNT WITH DIFFERENTIAL
Lymphs, Fluid: 94 %
Monocyte-Macrophage-Serous Fluid: 6 % — ABNORMAL LOW (ref 50–90)
Total Nucleated Cell Count, Fluid: 626 cu mm (ref 0–1000)

## 2019-07-13 LAB — GLUCOSE, PLEURAL OR PERITONEAL FLUID: Glucose, Fluid: 88 mg/dL

## 2019-07-13 LAB — PROTEIN, PLEURAL OR PERITONEAL FLUID: Total protein, fluid: 4.4 g/dL

## 2019-07-13 LAB — LACTATE DEHYDROGENASE, PLEURAL OR PERITONEAL FLUID: LD, Fluid: 151 U/L — ABNORMAL HIGH (ref 3–23)

## 2019-07-13 MED ORDER — LIDOCAINE HCL 1 % IJ SOLN
INTRAMUSCULAR | Status: AC
  Filled 2019-07-13: qty 20

## 2019-07-13 NOTE — Procedures (Signed)
PROCEDURE SUMMARY:  Successful US guided right thoracentesis. Yielded approximately 650 ml of pink-tinged fluid. Pt tolerated procedure well. No immediate complications.  Specimen sent for labs. CXR ordered/complete. No pneumothorax. Patient discharged home with daughter.  EBL < 5 mL  Theresa Duty, NP 07/13/2019 2:06 PM

## 2019-07-14 LAB — PATHOLOGIST SMEAR REVIEW

## 2019-07-21 ENCOUNTER — Inpatient Hospital Stay (HOSPITAL_COMMUNITY): Payer: Medicare HMO | Attending: Nurse Practitioner

## 2019-07-21 ENCOUNTER — Other Ambulatory Visit: Payer: Self-pay

## 2019-07-21 DIAGNOSIS — R001 Bradycardia, unspecified: Secondary | ICD-10-CM | POA: Insufficient documentation

## 2019-07-21 DIAGNOSIS — M1990 Unspecified osteoarthritis, unspecified site: Secondary | ICD-10-CM | POA: Diagnosis not present

## 2019-07-21 DIAGNOSIS — Z90721 Acquired absence of ovaries, unilateral: Secondary | ICD-10-CM | POA: Insufficient documentation

## 2019-07-21 DIAGNOSIS — Z8249 Family history of ischemic heart disease and other diseases of the circulatory system: Secondary | ICD-10-CM | POA: Diagnosis not present

## 2019-07-21 DIAGNOSIS — I441 Atrioventricular block, second degree: Secondary | ICD-10-CM | POA: Diagnosis not present

## 2019-07-21 DIAGNOSIS — Z8719 Personal history of other diseases of the digestive system: Secondary | ICD-10-CM | POA: Diagnosis not present

## 2019-07-21 DIAGNOSIS — Z8673 Personal history of transient ischemic attack (TIA), and cerebral infarction without residual deficits: Secondary | ICD-10-CM | POA: Insufficient documentation

## 2019-07-21 DIAGNOSIS — Z8261 Family history of arthritis: Secondary | ICD-10-CM | POA: Diagnosis not present

## 2019-07-21 DIAGNOSIS — J948 Other specified pleural conditions: Secondary | ICD-10-CM | POA: Diagnosis not present

## 2019-07-21 DIAGNOSIS — Z79899 Other long term (current) drug therapy: Secondary | ICD-10-CM | POA: Insufficient documentation

## 2019-07-21 DIAGNOSIS — C9210 Chronic myeloid leukemia, BCR/ABL-positive, not having achieved remission: Secondary | ICD-10-CM | POA: Insufficient documentation

## 2019-07-21 DIAGNOSIS — J90 Pleural effusion, not elsewhere classified: Secondary | ICD-10-CM | POA: Insufficient documentation

## 2019-07-21 LAB — CBC WITH DIFFERENTIAL/PLATELET
Abs Immature Granulocytes: 0.01 10*3/uL (ref 0.00–0.07)
Basophils Absolute: 0 10*3/uL (ref 0.0–0.1)
Basophils Relative: 0 %
Eosinophils Absolute: 0 10*3/uL (ref 0.0–0.5)
Eosinophils Relative: 1 %
HCT: 37.5 % (ref 36.0–46.0)
Hemoglobin: 11.5 g/dL — ABNORMAL LOW (ref 12.0–15.0)
Immature Granulocytes: 0 %
Lymphocytes Relative: 51 %
Lymphs Abs: 2.9 10*3/uL (ref 0.7–4.0)
MCH: 29.7 pg (ref 26.0–34.0)
MCHC: 30.7 g/dL (ref 30.0–36.0)
MCV: 96.9 fL (ref 80.0–100.0)
Monocytes Absolute: 0.4 10*3/uL (ref 0.1–1.0)
Monocytes Relative: 7 %
Neutro Abs: 2.4 10*3/uL (ref 1.7–7.7)
Neutrophils Relative %: 41 %
Platelets: 248 10*3/uL (ref 150–400)
RBC: 3.87 MIL/uL (ref 3.87–5.11)
RDW: 15.9 % — ABNORMAL HIGH (ref 11.5–15.5)
WBC: 5.7 10*3/uL (ref 4.0–10.5)
nRBC: 0 % (ref 0.0–0.2)

## 2019-07-21 LAB — COMPREHENSIVE METABOLIC PANEL
ALT: 24 U/L (ref 0–44)
AST: 39 U/L (ref 15–41)
Albumin: 3.6 g/dL (ref 3.5–5.0)
Alkaline Phosphatase: 114 U/L (ref 38–126)
Anion gap: 10 (ref 5–15)
BUN: 32 mg/dL — ABNORMAL HIGH (ref 8–23)
CO2: 25 mmol/L (ref 22–32)
Calcium: 8.9 mg/dL (ref 8.9–10.3)
Chloride: 102 mmol/L (ref 98–111)
Creatinine, Ser: 1.66 mg/dL — ABNORMAL HIGH (ref 0.44–1.00)
GFR calc Af Amer: 35 mL/min — ABNORMAL LOW (ref >60)
GFR calc non Af Amer: 30 mL/min — ABNORMAL LOW (ref >60)
Glucose, Bld: 93 mg/dL (ref 70–99)
Potassium: 5.1 mmol/L (ref 3.5–5.1)
Sodium: 137 mmol/L (ref 135–145)
Total Bilirubin: 0.4 mg/dL (ref 0.3–1.2)
Total Protein: 7.4 g/dL (ref 6.5–8.1)

## 2019-07-21 LAB — FERRITIN: Ferritin: 1327 ng/mL — ABNORMAL HIGH (ref 11–307)

## 2019-07-21 LAB — IRON AND TIBC
Iron: 53 ug/dL (ref 28–170)
Saturation Ratios: 19 % (ref 10.4–31.8)
TIBC: 286 ug/dL (ref 250–450)
UIBC: 233 ug/dL

## 2019-07-21 LAB — VITAMIN B12: Vitamin B-12: 805 pg/mL (ref 180–914)

## 2019-07-21 LAB — VITAMIN D 25 HYDROXY (VIT D DEFICIENCY, FRACTURES): Vit D, 25-Hydroxy: 20.76 ng/mL — ABNORMAL LOW (ref 30–100)

## 2019-07-21 LAB — LACTATE DEHYDROGENASE: LDH: 238 U/L — ABNORMAL HIGH (ref 98–192)

## 2019-07-26 LAB — BCR-ABL1, CML/ALL, PCR, QUANT: Interpretation (BCRAL):: NEGATIVE

## 2019-07-27 NOTE — Progress Notes (Signed)
[address], [place] [address]   CLINIC:  Medical Oncology/Hematology  PCP:  [facility], [place] / [place] [address] [phone]   REASON FOR VISIT:  Follow-up for CML  CURRENT THERAPY: dasatinib(Sprycel) 50 mg  BRIEF ONCOLOGIC HISTORY:  Oncology History  Chronic myelogenous leukemia (CML), BCR-ABL1-positive ([place])  06/01/2014 Tumor Marker   Results for MATTISON, GOLAY (MRN [id number]) as of 12/02/2014 09:30  06/01/2014 09:59 BCR ABL1 / ABL1 IS: 71.220 (H)    07/14/2014 Bone Marrow Biopsy   Bone Marrow, Aspirate,Biopsy, and Clot, right iliac - HYPERCELLULAR BONE MARROW WITH CHRONIC MYELOGENOUS LEUKEMIA. - SEE COMMENT. PERIPHERAL BLOOD: - CHRONIC MYELOGENOUS LEUKEMIA. Diagnosis Note Previous PCR analysis performed for bcr-abl1 is report   07/21/2014 - 08/04/2014 Chemotherapy   Tasigna 300 mg BID   08/04/2014 Miscellaneous   Ran out of 2 week sample secondary to issues with insurance approval.   08/15/2014 -  Chemotherapy   Tasigna 300 mg BID   09/17/2014 Tumor Marker   b3a2 transcript % 4.122    12/02/2014 Tumor Marker   b3a2 transcript: 0.046    03/31/2015 Tumor Marker   b3a2 transcript:  <0.001 %      03/31/2015 Remission   Complete molecular response, no detectable BCR/ABL using PCR assay   05/24/2016 Remission   NEGATIVE for the BCR-ABL1 e1a2 (p190), e13a2 (b2a2, p210) and e14a2 (b3a2, p210) fusion transcripts.     CANCER STAGING: Cancer Staging No matching staging information was found for the patient.  INTERVAL HISTORY:  April Page 74 y.o. female returns for routine follow-up. April Page was last seen on 05/13/2019.  She lives at home by herself although an aide is there during the daytime.  She walks with the help of walker.  Overall, she tells me she has been feeling pretty well. She continues to drink 3 Ensure per day, but she eats very little solid food per day. She might eat a cookie or a cake, but she  doesn't want any meats or vegetables.    REVIEW OF SYSTEMS:  Review of Systems  Constitutional: Positive for appetite change (no appetite) and fatigue (moderate). Negative for chills, diaphoresis and fever.  HENT:   Negative for mouth sores, sore throat and trouble swallowing.   Eyes: Negative for eye problems.  Respiratory: Negative for cough, shortness of breath and wheezing.   Cardiovascular: Negative for chest pain, leg swelling and palpitations.  Gastrointestinal: Negative for abdominal pain, constipation, diarrhea, nausea and vomiting.  Genitourinary: Negative for bladder incontinence, dysuria and frequency.   Musculoskeletal: Negative for arthralgias, back pain and myalgias.  Skin: Negative for rash.  Neurological: Negative for dizziness, extremity weakness, headaches and numbness.  Hematological: Does not bruise/bleed easily.  Psychiatric/Behavioral: Negative for depression and sleep disturbance. The patient is not nervous/anxious.     PAST MEDICAL/SURGICAL HISTORY:  Past Medical History:  Diagnosis Date  . Arthritis    stiff knees  . Blood dyscrasia   . Cancer ([place])   . CVA (cerebral vascular accident) ([place])   . GERD (gastroesophageal reflux disease)   . H/O: CML (chronic myeloid leukemia)   . Hyperlipidemia   . Hyperlipidemia   . Hypertension   . Iron deficiency anemia 07/15/2018  . Obesity   . Pessary maintenance 07/01/2013  . Stroke ([place])    x 3   Past Surgical History:  Procedure Laterality Date  . ABDOMINAL HYSTERECTOMY    . BIOPSY  08/20/2018   Procedure: BIOPSY;  Surgeon:  Rourk, Cristopher Estimable, MD;  [facility];  Service: Endoscopy;;  gastric  . CHOLECYSTECTOMY    . COLONOSCOPY  06/09/2002   INO:MVEHMCNO hemorrhoids; otherwise normal rectum, colon   . COLONOSCOPY N/A 08/25/2013   BSJ:GGEZMOQ diverticulosis. Single colonic polyp-removed  s/p segmental biopsy and stool sample. random colon bx negative. +benign leiomyoma.  . cystocele/rectocele repair   2009  . ESOPHAGOGASTRODUODENOSCOPY  06/09/2002   HUT:MLYYTK upper gastrointestinal tract s/p  54-French Maloney dilator  . ESOPHAGOGASTRODUODENOSCOPY (EGD) WITH PROPOFOL N/A 08/20/2018   Dr. Gala Romney: erosive reflux esophagitis, gastritis but no h.pylori, hiatal hernia, s/p esophageal dilation  . FEMUR IM NAIL Right 04/12/2016   Procedure: INTRAMEDULLARY (IM) NAIL FEMORAL;  Surgeon: Nicholes Stairs, MD;  Location: [facility];  Service: Orthopedics;  Laterality: Right;  . HERNIA REPAIR    . IR THORACENTESIS ASP PLEURAL SPACE W/IMG GUIDE  05/07/2018  . IR THORACENTESIS ASP PLEURAL SPACE W/IMG GUIDE  07/13/2019  . LOOP RECORDER INSERTION N/A 08/23/2016   Procedure: Loop Recorder Insertion;  Surgeon: Constance Haw, MD;  Location: [facility];  Service: Cardiovascular;  Laterality: N/A;  . LOOP RECORDER REMOVAL N/A 05/07/2018   Procedure: LOOP RECORDER REMOVAL;  Surgeon: Constance Haw, MD;  Location: [facility];  Service: Cardiovascular;  Laterality: N/A;  . Venia Minks DILATION N/A 08/20/2018   Procedure: Venia Minks DILATION;  Surgeon: [name], MD;  [facility];  Service: Endoscopy;  Laterality: N/A;  . OOPHORECTOMY    . PACEMAKER IMPLANT N/A 05/07/2018   Procedure: PACEMAKER IMPLANT;  Surgeon: Constance Haw, MD;  Location: [facility];  Service: Cardiovascular;  Laterality: N/A;  . TEE WITHOUT CARDIOVERSION N/A 08/23/2016   Procedure: TRANSESOPHAGEAL ECHOCARDIOGRAM (TEE);  Surgeon: Fay Records, MD;  Location: [facility];  Service: Cardiovascular;  Laterality: N/A;    SOCIAL HISTORY:  Social History   Socioeconomic History  . Marital status: Widowed    Spouse name: Not on file  . Number of children: 6  . Years of education: associate  . Highest education level: Not on file  Occupational History    Employer: RETIRED  Tobacco Use  . Smoking status: Never Smoker  . Smokeless tobacco: Never Used  Substance and Sexual Activity  . Alcohol use:  No  . Drug use: No  . Sexual activity: Not Currently    Birth control/protection: Surgical    Comment: hyst  Other Topics Concern  . Not on file  Social History Narrative  . Not on file   Social Determinants of Health   Financial Resource Strain:   . Difficulty of Paying Living Expenses:   Food Insecurity:   . Worried About Charity fundraiser in the Last Year:   . Arboriculturist in the Last Year:   Transportation Needs:   . Film/video editor (Medical):   Marland Kitchen Lack of Transportation (Non-Medical):   Physical Activity:   . Days of Exercise per Week:   . Minutes of Exercise per Session:   Stress:   . Feeling of Stress :   Social Connections:   . Frequency of Communication with Friends and Family:   . Frequency of Social Gatherings with Friends and Family:   . Attends Religious Services:   . Active Member of Clubs or Organizations:   . Attends Archivist Meetings:   Marland Kitchen Marital Status:   Intimate Partner Violence:   . Fear of Current or Ex-Partner:   . Emotionally  Abused:   Marland Kitchen Physically Abused:   . Sexually Abused:     FAMILY HISTORY:  Family History  Problem Relation Age of Onset  . Anuerysm Mother        deceased age 8, brain anuerysm  . Early death Mother 78  . Heart disease Father   . Hypertension Sister   . Obesity Brother   . Hypertension Sister   . Arthritis Sister   . Other Son        cardiac arrest  . Heart disease Child 82       cardiac arrest  . Colon cancer Neg Hx     CURRENT MEDICATIONS:  Current Outpatient Medications  Medication Sig Dispense Refill  . acetaminophen (TYLENOL) 325 MG tablet Take 2 tablets (650 mg total) by mouth every 6 (six) hours as needed for mild pain (or temp > 37.5 C (99.5 F)).    Marland Kitchen amLODipine (NORVASC) 10 MG tablet Take 1 tablet (10 mg total) by mouth daily. 30 tablet 0  . atorvastatin (LIPITOR) 40 MG tablet Take 1 tablet (40 mg total) by mouth daily at 6 PM. 30 tablet 0  . feeding supplement, ENSURE ENLIVE,  (ENSURE ENLIVE) LIQD Take 237 mLs by mouth 2 (two) times daily between meals. 237 mL 12  . hydrALAZINE (APRESOLINE) 25 MG tablet Take by mouth.    . losartan (COZAAR) 100 MG tablet Take 1 tablet (100 mg total) by mouth daily. Resume on 3/28 (Patient taking differently: Take 100 mg by mouth daily. ) 30 tablet 0  . Multiple Vitamin (MULTIVITAMIN WITH MINERALS) TABS tablet Take 1 tablet by mouth daily.    . polyethylene glycol (MIRALAX / GLYCOLAX) packet Take 17 g by mouth daily. 14 each 0  . sennosides-docusate sodium (SENOKOT-S) 8.6-50 MG tablet Take 1 tablet by mouth every evening.    . dasatinib (SPRYCEL) 50 MG tablet Take 1 tablet (50 mg total) by mouth daily. 30 tablet 3  . furosemide (LASIX) 20 MG tablet      No current facility-administered medications for this visit.    ALLERGIES:  No Known Allergies  PHYSICAL EXAM:  Performance status (ECOG): 2 - Symptomatic, <50% confined to bed  Vitals:   07/28/19 1000  BP: (!) 113/56  Pulse: 84  Resp: 18  Temp: (!) 97.1 F (36.2 C)  SpO2: 97%   Wt Readings from Last 3 Encounters:  07/28/19 117 lb 4 oz (53.2 kg)  01/22/19 123 lb (55.8 kg)  10/22/18 129 lb 2 oz (58.6 kg)   Physical Exam Constitutional:      General: She is not in acute distress.    Appearance: Normal appearance. She is normal weight. She is not ill-appearing.  HENT:     Nose: No congestion or rhinorrhea.     Mouth/Throat:     Mouth: Mucous membranes are moist.     Pharynx: No oropharyngeal exudate or posterior oropharyngeal erythema.  Eyes:     Extraocular Movements: Extraocular movements intact.     Pupils: Pupils are equal, round, and reactive to light.  Cardiovascular:     Rate and Rhythm: Normal rate and regular rhythm.     Pulses: Normal pulses.     Heart sounds: Normal heart sounds. No murmur. No friction rub. No gallop.   Pulmonary:     Effort: Pulmonary effort is normal.     Breath sounds: Normal breath sounds. No wheezing, rhonchi or rales.    Abdominal:     Palpations: There is no mass.  Tenderness: There is no abdominal tenderness. There is no guarding.  Musculoskeletal:        General: No swelling or tenderness.     Right lower leg: No edema.     Left lower leg: No edema.  Skin:    Findings: No bruising or erythema.  Neurological:     Mental Status: She is alert and oriented to person, place, and time.     Sensory: No sensory deficit.  Psychiatric:        Mood and Affect: Mood normal.        Behavior: Behavior normal.        Thought Content: Thought content normal.        Judgment: Judgment normal.     LABORATORY DATA:  I have reviewed the labs as listed.  CBC Latest Ref Rng & Units 07/21/2019 05/13/2019 04/26/2019  WBC 4.0 - 10.5 K/uL 5.7 3.6(L) 3.8(L)  Hemoglobin 12.0 - 15.0 g/dL 11.5(L) 10.2(L) 10.6(L)  Hematocrit 36.0 - 46.0 % 37.5 34.1(L) 35.1(L)  Platelets 150 - 400 K/uL 248 250 245   CMP Latest Ref Rng & Units 07/21/2019 05/13/2019 04/26/2019  Glucose 70 - 99 mg/dL 93 96 93  BUN 8 - 23 mg/dL 32(H) 41(H) 41(H)  Creatinine 0.44 - 1.00 mg/dL 1.66(H) 1.61(H) 1.79(H)  Sodium 135 - 145 mmol/L 137 139 137  Potassium 3.5 - 5.1 mmol/L 5.1 5.2(H) 5.4(H)  Chloride 98 - 111 mmol/L 102 107 104  CO2 22 - 32 mmol/L _0 Calcium 8.9 - 10.3 mg/dL 8.9 8.8(L) 8.9  Total Protein 6.5 - 8.1 g/dL 7.4 7.3 7.7  Total Bilirubin 0.3 - 1.2 mg/dL 0.4 0.4 0.4  Alkaline Phos 38 - 126 U/L 114 100 102  AST 15 - 41 U/L 39 41 42(H)  ALT 0 - 44 U/L _1 DIAGNOSTIC IMAGING:  I have independently reviewed the scans and discussed with the patient. DG Chest 1 View  Result Date: 07/13/2019 CLINICAL DATA:  Status post right thoracentesis. EXAM: CHEST  1 VIEW COMPARISON:  July 07, 2019. FINDINGS: Right pleural effusion is slightly decreased status post thoracentesis. No pneumothorax is noted. Stable left basilar opacity is noted concerning for effusion with underlying atelectasis. Left-sided pacemaker is unchanged. Bony thorax is  unremarkable. IMPRESSION: Right pleural effusion is slightly decreased status post thoracentesis. No pneumothorax is noted. Stable left basilar opacity is noted concerning for effusion with underlying atelectasis. Electronically Signed   By: Marijo Conception M.D.   On: 07/13/2019 11:07   DG Chest 2 View  Result Date: 07/08/2019 CLINICAL DATA:  Assess pleural effusion. EXAM: CHEST - 2 VIEW COMPARISON:  05/08/2018 FINDINGS: Patient is rotated to the right. Left-sided pacemaker unchanged. Interval development of a moderate to large right pleural effusion occupying approximately half of the right thorax. Small left pleural effusion. Likely associated bibasilar atelectasis. Cardiac silhouette is mostly obscured. Remainder of the exam is unchanged. IMPRESSION: Moderate to large right pleural effusion and small left pleural effusion likely with associated bibasilar atelectasis. Electronically Signed   By: Marin Olp M.D.   On: 07/08/2019 09:01   IR THORACENTESIS ASP PLEURAL SPACE W/IMG GUIDE  Result Date: 07/13/2019 INDICATION: Patient with a history of chronic myelogenous leukemia and pleural effusions. IR asked to perform a therapeutic and diagnostic thoracentesis EXAM: ULTRASOUND GUIDED RIGHT THORACENTESIS MEDICATIONS: 1% lidocaine, 10 ml COMPLICATIONS: None immediate. PROCEDURE: An ultrasound guided thoracentesis was thoroughly discussed with the patient and questions answered. The benefits, risks, alternatives and  complications were also discussed. The patient understands and wishes to proceed with the procedure. Written consent was obtained. Ultrasound was performed to localize and mark an adequate pocket of fluid in the right chest. The area was then prepped and draped in the normal sterile fashion. 1% Lidocaine was used for local anesthesia. Under ultrasound guidance a 6 Fr Safe-T-Centesis catheter was introduced. Thoracentesis was performed. The catheter was removed and a dressing applied. FINDINGS: A  total of approximately 650 ml of pink-tinged fluid was removed. Samples were sent to the laboratory as requested by the clinical team. IMPRESSION: Successful ultrasound guided right thoracentesis yielding 650 ml of pleural fluid. Read by: Soyla Dryer, NP Electronically Signed   By: Jerilynn Mages.  Shick M.D.   On: 07/13/2019 14:14     ASSESSMENT & PLAN:  Chronic myelogenous leukemia (CML), BCR-ABL1-positive ([place]) 1.  CML in chronic phase: -Diagnosed in March 2016, treated initially with Nilotinib from May 2016 through July 2018 secondary to recurrent strokes. -Dasatinib 100 mg daily started in February 2019.  She had thoracentesis done in February 2020. -She had thoracentesis done on 07/13/2018 on the right side with 650 mL of pink-tinged fluid. -BCR/ABL by quantitative PCR on 07/21/2019 was negative.  CBC shows white count 5.7, hemoglobin 11.5 and platelet count 248. -She started Lasix 20 mg back today per the recommendations of her kidney doctor. -As she has recurrent fluid retention, we will start her at a lower dose of dasatinib. -I plan to cut her dasatinib to 50 mg daily.  2.  CKD: -Creatinine was 1.66. -Ultrasound of the abdomen on 06/18/2019 shows kidneys slightly small and echogenic as seen previously with no obstruction.  Bilateral pleural effusions.  3.  Bradycardia: -Mobitz type II AV block.  Pacemaker placed on 05/06/2018. -She follows up with cardiology.  4.  Hypertension: -She is on losartan 100 mg daily, hydralazine 25 mg daily and Norvasc 10 mg daily.  Blood pressure is 113/56.  5.  Weight loss: -She lost 6 pounds from November last year.  She is drinking about 3 cans of Ensure per day and is snacking.  Not eating regular meals.  Denies any nausea vomiting or diarrhea. -Loose at home by herself with an aide during daytime.  Uses walker at home.  We will closely monitor the weight.    Orders placed this encounter:  Orders Placed This Encounter  Procedures  . CBC with Differential   . Comprehensive metabolic panel  . BCR-ABL1, CML/ALL, PCR, QUANT  . Vitamin B12  . Ferritin  . Iron and TIBC  . Vitamin D 25 hydroxy    Total time spent is 30 minutes with more than 50% of the time spent face-to-face discussing treatment plan, reviewing hospital records, counseling and coordination of care.   Derek Jack, MD, 07/30/19 5:02 PM  [place] [phone]   I, Jacqualyn Posey, am acting as a scribe for Dr. Sanda Linger.  I, Derek Jack MD, have reviewed the above documentation for accuracy and completeness, and I agree with the above.

## 2019-07-28 ENCOUNTER — Encounter (HOSPITAL_COMMUNITY): Payer: Self-pay | Admitting: Hematology

## 2019-07-28 ENCOUNTER — Inpatient Hospital Stay (HOSPITAL_BASED_OUTPATIENT_CLINIC_OR_DEPARTMENT_OTHER): Payer: Medicare HMO | Admitting: Hematology

## 2019-07-28 ENCOUNTER — Other Ambulatory Visit: Payer: Self-pay

## 2019-07-28 DIAGNOSIS — E211 Secondary hyperparathyroidism, not elsewhere classified: Secondary | ICD-10-CM | POA: Diagnosis not present

## 2019-07-28 DIAGNOSIS — J948 Other specified pleural conditions: Secondary | ICD-10-CM | POA: Diagnosis not present

## 2019-07-28 DIAGNOSIS — R001 Bradycardia, unspecified: Secondary | ICD-10-CM | POA: Diagnosis not present

## 2019-07-28 DIAGNOSIS — E559 Vitamin D deficiency, unspecified: Secondary | ICD-10-CM | POA: Diagnosis not present

## 2019-07-28 DIAGNOSIS — C9210 Chronic myeloid leukemia, BCR/ABL-positive, not having achieved remission: Secondary | ICD-10-CM | POA: Diagnosis not present

## 2019-07-28 DIAGNOSIS — M1990 Unspecified osteoarthritis, unspecified site: Secondary | ICD-10-CM | POA: Diagnosis not present

## 2019-07-28 DIAGNOSIS — E875 Hyperkalemia: Secondary | ICD-10-CM | POA: Diagnosis not present

## 2019-07-28 DIAGNOSIS — Z79899 Other long term (current) drug therapy: Secondary | ICD-10-CM | POA: Diagnosis not present

## 2019-07-28 DIAGNOSIS — I5032 Chronic diastolic (congestive) heart failure: Secondary | ICD-10-CM | POA: Diagnosis not present

## 2019-07-28 DIAGNOSIS — Z8673 Personal history of transient ischemic attack (TIA), and cerebral infarction without residual deficits: Secondary | ICD-10-CM | POA: Diagnosis not present

## 2019-07-28 DIAGNOSIS — I129 Hypertensive chronic kidney disease with stage 1 through stage 4 chronic kidney disease, or unspecified chronic kidney disease: Secondary | ICD-10-CM | POA: Diagnosis not present

## 2019-07-28 DIAGNOSIS — J90 Pleural effusion, not elsewhere classified: Secondary | ICD-10-CM | POA: Diagnosis not present

## 2019-07-28 DIAGNOSIS — N184 Chronic kidney disease, stage 4 (severe): Secondary | ICD-10-CM | POA: Diagnosis not present

## 2019-07-28 DIAGNOSIS — I441 Atrioventricular block, second degree: Secondary | ICD-10-CM | POA: Diagnosis not present

## 2019-07-28 DIAGNOSIS — Z8719 Personal history of other diseases of the digestive system: Secondary | ICD-10-CM | POA: Diagnosis not present

## 2019-07-28 DIAGNOSIS — D638 Anemia in other chronic diseases classified elsewhere: Secondary | ICD-10-CM | POA: Diagnosis not present

## 2019-07-28 NOTE — Patient Instructions (Signed)
[place] at [facility] Discharge Instructions  You were seen today by Dr. Delton Coombes. He went over your recent results. We will reduce the dose of of her chemo pill to half and send in the new prescription to the pharmacy. Discontinue Lasix at this time to see how you tolerate the new dosage of dasatinib(Sprycel). He will see you back in for labs and follow up.   Thank you for choosing [place] at [facility] to provide your oncology and hematology care.  To afford each patient quality time with our provider, please arrive at least 15 minutes before your scheduled appointment time.   If you have a lab appointment with the [facility] please come in thru the  Main Entrance and check in at the main information desk  You need to re-schedule your appointment should you arrive 10 or more minutes late.  We strive to give you quality time with our providers, and arriving late affects you and other patients whose appointments are after yours.  Also, if you no show three or more times for appointments you may be dismissed from the clinic at the providers discretion.     Again, thank you for choosing [facility].  Our hope is that these requests will decrease the amount of time that you wait before being seen by our physicians.       _____________________________________________________________  Should you have questions after your visit to [facility], please contact our office at [phone] between the hours of 8:00 a.m. and 4:30 p.m.  Voicemails left after 4:00 p.m. will not be returned until the following business day.  For prescription refill requests, have your pharmacy contact our office and allow 72 hours.    Cancer Center Support Programs:   > Cancer Support Group  2nd Tuesday of the month 1pm-2pm, Journey Room

## 2019-07-28 NOTE — Assessment & Plan Note (Addendum)
1.  CML in chronic phase: -Diagnosed in March 2016, treated initially with Nilotinib from May 2016 through July 2018 secondary to recurrent strokes. -Dasatinib 100 mg daily started in February 2019.  She had thoracentesis done in February 2020. -She had thoracentesis done on 07/13/2018 on the right side with 650 mL of pink-tinged fluid. -BCR/ABL by quantitative PCR on 07/21/2019 was negative.  CBC shows white count 5.7, hemoglobin 11.5 and platelet count 248. -She started Lasix 20 mg back today per the recommendations of her kidney doctor. -As she has recurrent fluid retention, we will start her at a lower dose of dasatinib. -I plan to cut her dasatinib to 50 mg daily.  2.  CKD: -Creatinine was 1.66. -Ultrasound of the abdomen on 06/18/2019 shows kidneys slightly small and echogenic as seen previously with no obstruction.  Bilateral pleural effusions.  3.  Bradycardia: -Mobitz type II AV block.  Pacemaker placed on 05/06/2018. -She follows up with cardiology.  4.  Hypertension: -She is on losartan 100 mg daily, hydralazine 25 mg daily and Norvasc 10 mg daily.  Blood pressure is 113/56.  5.  Weight loss: -She lost 6 pounds from November last year.  She is drinking about 3 cans of Ensure per day and is snacking.  Not eating regular meals.  Denies any nausea vomiting or diarrhea. -Loose at home by herself with an aide during daytime.  Uses walker at home.  We will closely monitor the weight.

## 2019-07-30 MED ORDER — DASATINIB 50 MG PO TABS: 50.0000 mg | ORAL_TABLET | Freq: Every day | ORAL | 3 refills | Status: DC

## 2019-08-04 ENCOUNTER — Ambulatory Visit (INDEPENDENT_AMBULATORY_CARE_PROVIDER_SITE_OTHER): Payer: Medicare HMO | Admitting: *Deleted

## 2019-08-04 DIAGNOSIS — I639 Cerebral infarction, unspecified: Secondary | ICD-10-CM | POA: Diagnosis not present

## 2019-08-04 LAB — CUP PACEART REMOTE DEVICE CHECK
Battery Remaining Longevity: 140 mo
Battery Voltage: 3.03 V
Brady Statistic AP VP Percent: 0.99 %
Brady Statistic AP VS Percent: 0 %
Brady Statistic AS VP Percent: 98.83 %
Brady Statistic AS VS Percent: 0.18 %
Brady Statistic RA Percent Paced: 1 %
Brady Statistic RV Percent Paced: 99.82 %
Date Time Interrogation Session: 20210526062720
Implantable Lead Implant Date: 20200227
Implantable Lead Implant Date: 20200227
Implantable Lead Location: 753859
Implantable Lead Location: 753860
Implantable Lead Model: 5076
Implantable Lead Model: 5076
Implantable Pulse Generator Implant Date: 20200227
Lead Channel Impedance Value: 228 Ohm
Lead Channel Impedance Value: 266 Ohm
Lead Channel Impedance Value: 361 Ohm
Lead Channel Impedance Value: 418 Ohm
Lead Channel Pacing Threshold Amplitude: 0.5 V
Lead Channel Pacing Threshold Amplitude: 0.625 V
Lead Channel Pacing Threshold Pulse Width: 0.4 ms
Lead Channel Pacing Threshold Pulse Width: 0.4 ms
Lead Channel Sensing Intrinsic Amplitude: 1.375 mV
Lead Channel Sensing Intrinsic Amplitude: 1.375 mV
Lead Channel Sensing Intrinsic Amplitude: 14.75 mV
Lead Channel Sensing Intrinsic Amplitude: 14.75 mV
Lead Channel Setting Pacing Amplitude: 1.5 V
Lead Channel Setting Pacing Amplitude: 2 V
Lead Channel Setting Pacing Pulse Width: 0.4 ms
Lead Channel Setting Sensing Sensitivity: 2 mV

## 2019-08-05 NOTE — Progress Notes (Signed)
Remote pacemaker transmission.   

## 2019-08-11 ENCOUNTER — Inpatient Hospital Stay (HOSPITAL_COMMUNITY): Payer: Medicare HMO | Attending: Hematology

## 2019-08-11 ENCOUNTER — Other Ambulatory Visit: Payer: Self-pay

## 2019-08-11 ENCOUNTER — Other Ambulatory Visit (HOSPITAL_COMMUNITY): Payer: Medicare HMO

## 2019-08-11 DIAGNOSIS — Z8349 Family history of other endocrine, nutritional and metabolic diseases: Secondary | ICD-10-CM | POA: Insufficient documentation

## 2019-08-11 DIAGNOSIS — I10 Essential (primary) hypertension: Secondary | ICD-10-CM | POA: Diagnosis not present

## 2019-08-11 DIAGNOSIS — Z9221 Personal history of antineoplastic chemotherapy: Secondary | ICD-10-CM | POA: Insufficient documentation

## 2019-08-11 DIAGNOSIS — N189 Chronic kidney disease, unspecified: Secondary | ICD-10-CM | POA: Diagnosis not present

## 2019-08-11 DIAGNOSIS — D649 Anemia, unspecified: Secondary | ICD-10-CM | POA: Diagnosis not present

## 2019-08-11 DIAGNOSIS — Z90710 Acquired absence of both cervix and uterus: Secondary | ICD-10-CM | POA: Insufficient documentation

## 2019-08-11 DIAGNOSIS — E785 Hyperlipidemia, unspecified: Secondary | ICD-10-CM | POA: Insufficient documentation

## 2019-08-11 DIAGNOSIS — C9210 Chronic myeloid leukemia, BCR/ABL-positive, not having achieved remission: Secondary | ICD-10-CM | POA: Insufficient documentation

## 2019-08-11 DIAGNOSIS — Z79899 Other long term (current) drug therapy: Secondary | ICD-10-CM | POA: Diagnosis not present

## 2019-08-11 DIAGNOSIS — Z8249 Family history of ischemic heart disease and other diseases of the circulatory system: Secondary | ICD-10-CM | POA: Insufficient documentation

## 2019-08-11 DIAGNOSIS — Z8261 Family history of arthritis: Secondary | ICD-10-CM | POA: Diagnosis not present

## 2019-08-11 DIAGNOSIS — E669 Obesity, unspecified: Secondary | ICD-10-CM | POA: Diagnosis not present

## 2019-08-11 LAB — COMPREHENSIVE METABOLIC PANEL
ALT: 19 U/L (ref 0–44)
AST: 27 U/L (ref 15–41)
Albumin: 3.8 g/dL (ref 3.5–5.0)
Alkaline Phosphatase: 109 U/L (ref 38–126)
Anion gap: 9 (ref 5–15)
BUN: 40 mg/dL — ABNORMAL HIGH (ref 8–23)
CO2: 28 mmol/L (ref 22–32)
Calcium: 9.5 mg/dL (ref 8.9–10.3)
Chloride: 102 mmol/L (ref 98–111)
Creatinine, Ser: 1.19 mg/dL — ABNORMAL HIGH (ref 0.44–1.00)
GFR calc Af Amer: 52 mL/min — ABNORMAL LOW (ref >60)
GFR calc non Af Amer: 45 mL/min — ABNORMAL LOW (ref >60)
Glucose, Bld: 86 mg/dL (ref 70–99)
Potassium: 4.7 mmol/L (ref 3.5–5.1)
Sodium: 139 mmol/L (ref 135–145)
Total Bilirubin: 0.5 mg/dL (ref 0.3–1.2)
Total Protein: 7.7 g/dL (ref 6.5–8.1)

## 2019-08-11 LAB — CBC WITH DIFFERENTIAL/PLATELET
Abs Immature Granulocytes: 0.01 10*3/uL (ref 0.00–0.07)
Basophils Absolute: 0 10*3/uL (ref 0.0–0.1)
Basophils Relative: 1 %
Eosinophils Absolute: 0.1 10*3/uL (ref 0.0–0.5)
Eosinophils Relative: 1 %
HCT: 35.7 % — ABNORMAL LOW (ref 36.0–46.0)
Hemoglobin: 10.9 g/dL — ABNORMAL LOW (ref 12.0–15.0)
Immature Granulocytes: 0 %
Lymphocytes Relative: 40 %
Lymphs Abs: 1.8 10*3/uL (ref 0.7–4.0)
MCH: 29.5 pg (ref 26.0–34.0)
MCHC: 30.5 g/dL (ref 30.0–36.0)
MCV: 96.7 fL (ref 80.0–100.0)
Monocytes Absolute: 0.4 10*3/uL (ref 0.1–1.0)
Monocytes Relative: 10 %
Neutro Abs: 2.1 10*3/uL (ref 1.7–7.7)
Neutrophils Relative %: 48 %
Platelets: 251 10*3/uL (ref 150–400)
RBC: 3.69 MIL/uL — ABNORMAL LOW (ref 3.87–5.11)
RDW: 15.4 % (ref 11.5–15.5)
WBC: 4.3 10*3/uL (ref 4.0–10.5)
nRBC: 0 % (ref 0.0–0.2)

## 2019-08-11 LAB — IRON AND TIBC
Iron: 52 ug/dL (ref 28–170)
Saturation Ratios: 18 % (ref 10.4–31.8)
TIBC: 292 ug/dL (ref 250–450)
UIBC: 240 ug/dL

## 2019-08-11 LAB — VITAMIN B12: Vitamin B-12: 983 pg/mL — ABNORMAL HIGH (ref 180–914)

## 2019-08-11 LAB — VITAMIN D 25 HYDROXY (VIT D DEFICIENCY, FRACTURES): Vit D, 25-Hydroxy: 32.05 ng/mL (ref 30–100)

## 2019-08-11 LAB — FERRITIN: Ferritin: 502 ng/mL — ABNORMAL HIGH (ref 11–307)

## 2019-08-11 MED FILL — SPRYCEL 50 MG TABLET: 50 | 30 days supply | Qty: 30 | Fill #0

## 2019-08-12 ENCOUNTER — Ambulatory Visit (HOSPITAL_COMMUNITY): Payer: Medicare HMO | Admitting: Hematology

## 2019-08-13 ENCOUNTER — Inpatient Hospital Stay (HOSPITAL_BASED_OUTPATIENT_CLINIC_OR_DEPARTMENT_OTHER): Payer: Medicare HMO | Admitting: Hematology

## 2019-08-13 ENCOUNTER — Other Ambulatory Visit: Payer: Self-pay

## 2019-08-13 VITALS — BP 108/65 | HR 80 | Temp 98.5°F | Resp 18

## 2019-08-13 DIAGNOSIS — N189 Chronic kidney disease, unspecified: Secondary | ICD-10-CM | POA: Diagnosis not present

## 2019-08-13 DIAGNOSIS — C9210 Chronic myeloid leukemia, BCR/ABL-positive, not having achieved remission: Secondary | ICD-10-CM | POA: Diagnosis not present

## 2019-08-13 DIAGNOSIS — E669 Obesity, unspecified: Secondary | ICD-10-CM | POA: Diagnosis not present

## 2019-08-13 DIAGNOSIS — E785 Hyperlipidemia, unspecified: Secondary | ICD-10-CM | POA: Diagnosis not present

## 2019-08-13 DIAGNOSIS — D649 Anemia, unspecified: Secondary | ICD-10-CM | POA: Diagnosis not present

## 2019-08-13 DIAGNOSIS — Z9221 Personal history of antineoplastic chemotherapy: Secondary | ICD-10-CM | POA: Diagnosis not present

## 2019-08-13 DIAGNOSIS — I10 Essential (primary) hypertension: Secondary | ICD-10-CM | POA: Diagnosis not present

## 2019-08-13 DIAGNOSIS — Z79899 Other long term (current) drug therapy: Secondary | ICD-10-CM | POA: Diagnosis not present

## 2019-08-13 DIAGNOSIS — Z90710 Acquired absence of both cervix and uterus: Secondary | ICD-10-CM | POA: Diagnosis not present

## 2019-08-13 NOTE — Patient Instructions (Signed)
[facility] at [facility] Discharge Instructions  You were seen today by Dr. Katragadda. He went over your recent lab results. He will see you back in 2 months for labs and follow up.   Thank you for choosing [facility] at [facility] to provide your oncology and hematology care.  To afford each patient quality time with our provider, please arrive at least 15 minutes before your scheduled appointment time.   If you have a lab appointment with the Cancer Center please come in thru the  Main Entrance and check in at the main information desk  You need to re-schedule your appointment should you arrive 10 or more minutes late.  We strive to give you quality time with our providers, and arriving late affects you and other patients whose appointments are after yours.  Also, if you no show three or more times for appointments you may be dismissed from the clinic at the providers discretion.     Again, thank you for choosing [facility].  Our hope is that these requests will decrease the amount of time that you wait before being seen by our physicians.       _____________________________________________________________  Should you have questions after your visit to [facility], please contact our office at [phone] between the hours of 8:00 a.m. and 4:30 p.m.  Voicemails left after 4:00 p.m. will not be returned until the following business day.  For prescription refill requests, have your pharmacy contact our office and allow 72 hours.    Cancer Center Support Programs:   > Cancer Support Group  2nd Tuesday of the month 1pm-2pm, Journey Room    

## 2019-08-13 NOTE — Progress Notes (Signed)
[place], [place] [address]   CLINIC:  Medical Oncology/Hematology  PCP:  [facility], [place] / [place] [address] [phone]   REASON FOR VISIT:  Follow-up for CML  CURRENT THERAPY: dasatinib(Sprycel) 50 mg  BRIEF ONCOLOGIC HISTORY:  Oncology History  Chronic myelogenous leukemia (CML), BCR-ABL1-positive ([place])  06/01/2014 Tumor Marker   Results for ALIZE, BORRAYO (MRN [id number]) as of 12/02/2014 09:30  06/01/2014 09:59 BCR ABL1 / ABL1 IS: 71.220 (H)    07/14/2014 Bone Marrow Biopsy   Bone Marrow, Aspirate,Biopsy, and Clot, right iliac - HYPERCELLULAR BONE MARROW WITH CHRONIC MYELOGENOUS LEUKEMIA. - SEE COMMENT. PERIPHERAL BLOOD: - CHRONIC MYELOGENOUS LEUKEMIA. Diagnosis Note Previous PCR analysis performed for bcr-abl1 is report   07/21/2014 - 08/04/2014 Chemotherapy   Tasigna 300 mg BID   08/04/2014 Miscellaneous   Ran out of 2 week sample secondary to issues with insurance approval.   08/15/2014 -  Chemotherapy   Tasigna 300 mg BID   09/17/2014 Tumor Marker   b3a2 transcript % 4.122    12/02/2014 Tumor Marker   b3a2 transcript: 0.046    03/31/2015 Tumor Marker   b3a2 transcript:  <0.001 %      03/31/2015 Remission   Complete molecular response, no detectable BCR/ABL using PCR assay   05/24/2016 Remission   NEGATIVE for the BCR-ABL1 e1a2 (p190), e13a2 (b2a2, p210) and e14a2 (b3a2, p210) fusion transcripts.     CANCER STAGING: Cancer Staging No matching staging information was found for the patient.  INTERVAL HISTORY:  Ms. April Page 74 y.o. female seen for follow-up of CML.  She was last seen in our clinic on 07/28/2019.  She has some constipation but she is taking stool softeners and MiraLAX as needed.  Reports some watering of both eyes.  Her daughter thinks this happened before and was helped with Claritin.  She apparently received 50 mg of Sprycel last night.  She will start taking it today.   REVIEW OF  SYSTEMS:  Review of Systems  Constitutional: Negative for chills, diaphoresis and fever.  HENT:   Negative for mouth sores, sore throat and trouble swallowing.   Eyes: Negative for eye problems.  Respiratory: Negative for cough, shortness of breath and wheezing.   Cardiovascular: Negative for chest pain, leg swelling and palpitations.  Gastrointestinal: Positive for constipation. Negative for abdominal pain, diarrhea, nausea and vomiting.  Genitourinary: Negative for bladder incontinence, dysuria and frequency.   Musculoskeletal: Negative for arthralgias, back pain and myalgias.  Skin: Negative for rash.  Neurological: Negative for dizziness, extremity weakness, headaches and numbness.  Hematological: Does not bruise/bleed easily.  Psychiatric/Behavioral: Negative for depression and sleep disturbance. The patient is not nervous/anxious.   All other systems reviewed and are negative.   PAST MEDICAL/SURGICAL HISTORY:  Past Medical History:  Diagnosis Date   Arthritis    stiff knees   Blood dyscrasia    Cancer (HCC)    CVA (cerebral vascular accident) ([facility])    GERD (gastroesophageal reflux disease)    H/O: CML (chronic myeloid leukemia)    Hyperlipidemia    Hyperlipidemia    Hypertension    Iron deficiency anemia 07/15/2018   Obesity    Pessary maintenance 07/01/2013   Stroke ([place])    x 3   Past Surgical History:  Procedure Laterality Date   ABDOMINAL HYSTERECTOMY     BIOPSY  08/20/2018   Procedure: BIOPSY;  Surgeon: [name], MD;  [facility];  Service: Endoscopy;;  gastric   CHOLECYSTECTOMY     COLONOSCOPY  06/09/2002   KRC:VKFMMCRF hemorrhoids; otherwise normal rectum, colon    COLONOSCOPY N/A 08/25/2013   VOH:KGOVPCH diverticulosis. Single colonic polyp-removed  s/p segmental biopsy and stool sample. random colon bx negative. +benign leiomyoma.   cystocele/rectocele repair  2009   ESOPHAGOGASTRODUODENOSCOPY  06/09/2002   EKB:TCYELY  upper gastrointestinal tract s/p  54-French Maloney dilator   ESOPHAGOGASTRODUODENOSCOPY (EGD) WITH PROPOFOL N/A 08/20/2018   Dr. Gala Romney: erosive reflux esophagitis, gastritis but no h.pylori, hiatal hernia, s/p esophageal dilation   FEMUR IM NAIL Right 04/12/2016   Procedure: INTRAMEDULLARY (IM) NAIL FEMORAL;  Surgeon: Nicholes Stairs, MD;  Location: [facility];  Service: Orthopedics;  Laterality: Right;   HERNIA REPAIR     IR THORACENTESIS ASP PLEURAL SPACE W/IMG GUIDE  05/07/2018   IR THORACENTESIS ASP PLEURAL SPACE W/IMG GUIDE  07/13/2019   LOOP RECORDER INSERTION N/A 08/23/2016   Procedure: Loop Recorder Insertion;  Surgeon: Constance Haw, MD;  Location: [facility];  Service: Cardiovascular;  Laterality: N/A;   LOOP RECORDER REMOVAL N/A 05/07/2018   Procedure: LOOP RECORDER REMOVAL;  Surgeon: Constance Haw, MD;  Location: [facility];  Service: Cardiovascular;  Laterality: N/A;   MALONEY DILATION N/A 08/20/2018   Procedure: Venia Minks DILATION;  Surgeon: [name], MD;  [facility];  Service: Endoscopy;  Laterality: N/A;   OOPHORECTOMY     PACEMAKER IMPLANT N/A 05/07/2018   Procedure: PACEMAKER IMPLANT;  Surgeon: Constance Haw, MD;  Location: [facility];  Service: Cardiovascular;  Laterality: N/A;   TEE WITHOUT CARDIOVERSION N/A 08/23/2016   Procedure: TRANSESOPHAGEAL ECHOCARDIOGRAM (TEE);  Surgeon: Fay Records, MD;  Location: [facility];  Service: Cardiovascular;  Laterality: N/A;    SOCIAL HISTORY:  Social History   Socioeconomic History   Marital status: Widowed    Spouse name: Not on file   Number of children: 6   Years of education: associate   Highest education level: Not on file  Occupational History    Employer: RETIRED  Tobacco Use   Smoking status: Never Smoker   Smokeless tobacco: Never Used  Substance and Sexual Activity   Alcohol use: No   Drug use: No   Sexual activity: Not Currently     Birth control/protection: Surgical    Comment: hyst  Other Topics Concern   Not on file  Social History Narrative   Not on file   Social Determinants of Health   Financial Resource Strain:    Difficulty of Paying Living Expenses:   Food Insecurity:    Worried About Charity fundraiser in the Last Year:    Arboriculturist in the Last Year:   Transportation Needs:    Film/video editor (Medical):    Lack of Transportation (Non-Medical):   Physical Activity:    Days of Exercise per Week:    Minutes of Exercise per Session:   Stress:    Feeling of Stress :   Social Connections:    Frequency of Communication with Friends and Family:    Frequency of Social Gatherings with Friends and Family:    Attends Religious Services:    Active Member of Clubs or Organizations:    Attends Archivist Meetings:    Marital Status:   Intimate Partner Violence:    Fear of Current or Ex-Partner:    Emotionally Abused:    Physically Abused:    Sexually Abused:  FAMILY HISTORY:  Family History  Problem Relation Age of Onset   Anuerysm Mother        deceased age 54, brain anuerysm   Early death Mother 82   Heart disease Father    Hypertension Sister    Obesity Brother    Hypertension Sister    Arthritis Sister    Other Son        cardiac arrest   Heart disease Child 44       cardiac arrest   Colon cancer Neg Hx     CURRENT MEDICATIONS:  Current Outpatient Medications  Medication Sig Dispense Refill   amLODipine (NORVASC) 10 MG tablet Take 1 tablet (10 mg total) by mouth daily. 30 tablet 0   atorvastatin (LIPITOR) 40 MG tablet Take 1 tablet (40 mg total) by mouth daily at 6 PM. 30 tablet 0   dasatinib (SPRYCEL) 50 MG tablet Take 1 tablet (50 mg total) by mouth daily. 30 tablet 3   feeding supplement, ENSURE ENLIVE, (ENSURE ENLIVE) LIQD Take 237 mLs by mouth 2 (two) times daily between meals. 237 mL 12   furosemide (LASIX) 20 MG  tablet Take 20 mg by mouth daily.      hydrALAZINE (APRESOLINE) 25 MG tablet Take 25 mg by mouth 3 (three) times daily.      losartan (COZAAR) 100 MG tablet Take 1 tablet (100 mg total) by mouth daily. Resume on 3/28 (Patient taking differently: Take 100 mg by mouth daily. ) 30 tablet 0   Multiple Vitamin (MULTIVITAMIN WITH MINERALS) TABS tablet Take 1 tablet by mouth daily.     polyethylene glycol (MIRALAX / GLYCOLAX) packet Take 17 g by mouth daily. 14 each 0   sennosides-docusate sodium (SENOKOT-S) 8.6-50 MG tablet Take 1 tablet by mouth every evening.     acetaminophen (TYLENOL) 325 MG tablet Take 2 tablets (650 mg total) by mouth every 6 (six) hours as needed for mild pain (or temp > 37.5 C (99.5 F)). (Patient not taking: Reported on 08/13/2019)     No current facility-administered medications for this visit.    ALLERGIES:  No Known Allergies  PHYSICAL EXAM:  Performance status (ECOG): 2 - Symptomatic, <50% confined to bed  Vitals:   08/13/19 0821  BP: 108/65  Pulse: 80  Resp: 18  Temp: 98.5 F (36.9 C)  SpO2: 97%   Wt Readings from Last 3 Encounters:  07/28/19 117 lb 4 oz (53.2 kg)  01/22/19 123 lb (55.8 kg)  10/22/18 129 lb 2 oz (58.6 kg)   Physical Exam Vitals reviewed.  Constitutional:      General: She is not in acute distress.    Appearance: Normal appearance. She is not ill-appearing.  HENT:     Nose: No congestion or rhinorrhea.     Mouth/Throat:     Pharynx: No oropharyngeal exudate or posterior oropharyngeal erythema.  Eyes:     Extraocular Movements: Extraocular movements intact.     Pupils: Pupils are equal, round, and reactive to light.  Cardiovascular:     Rate and Rhythm: Normal rate and regular rhythm.     Heart sounds: Normal heart sounds. No murmur. No friction rub. No gallop.   Pulmonary:     Effort: Pulmonary effort is normal.     Breath sounds: Normal breath sounds. No wheezing, rhonchi or rales.  Abdominal:     Palpations: There is no  mass.     Tenderness: There is no abdominal tenderness. There is no guarding.  Musculoskeletal:  General: No swelling or tenderness.     Right lower leg: No edema.     Left lower leg: No edema.  Skin:    Findings: No bruising or erythema.  Neurological:     General: No focal deficit present.     Mental Status: She is alert and oriented to person, place, and time.     Sensory: No sensory deficit.  Psychiatric:        Mood and Affect: Mood normal.        Behavior: Behavior normal.   Slightly decreased breath sounds in both bases.  LABORATORY DATA:  I have reviewed the labs as listed.  CBC Latest Ref Rng & Units 08/11/2019 07/21/2019 05/13/2019  WBC 4.0 - 10.5 K/uL 4.3 5.7 3.6(L)  Hemoglobin 12.0 - 15.0 g/dL 10.9(L) 11.5(L) 10.2(L)  Hematocrit 36.0 - 46.0 % 35.7(L) 37.5 34.1(L)  Platelets 150 - 400 K/uL 251 248 250   CMP Latest Ref Rng & Units 08/11/2019 07/21/2019 05/13/2019  Glucose 70 - 99 mg/dL 86 93 96  BUN 8 - 23 mg/dL 40(H) 32(H) 41(H)  Creatinine 0.44 - 1.00 mg/dL 1.19(H) 1.66(H) 1.61(H)  Sodium 135 - 145 mmol/L 139 137 139  Potassium 3.5 - 5.1 mmol/L 4.7 5.1 5.2(H)  Chloride 98 - 111 mmol/L 102 102 107  CO2 22 - 32 mmol/L '28 25 23  ' Calcium 8.9 - 10.3 mg/dL 9.5 8.9 8.8(L)  Total Protein 6.5 - 8.1 g/dL 7.7 7.4 7.3  Total Bilirubin 0.3 - 1.2 mg/dL 0.5 0.4 0.4  Alkaline Phos 38 - 126 U/L 109 114 100  AST 15 - 41 U/L 27 39 41  ALT 0 - 44 U/L '19 24 28    ' DIAGNOSTIC IMAGING:  I have independently reviewed the scans and discussed with the patient. CUP PACEART REMOTE DEVICE CHECK  Result Date: 08/04/2019 Scheduled remote reviewed. Normal device function.  1 NSVT 8 beats 190's. Next remote 91 days. [facility]    ASSESSMENT:  1.  CML in chronic phase: -Diagnosed in March 2016, treated initially with Nilotinib from May 2016 through July 2018 secondary to recurrent strokes. -Dasatinib 100 mg daily started in February 2019.  She had thoracentesis done in February 2020. -She  again had thoracentesis done on 07/13/2018 on the right side with 650 mL of pink-tinged fluid. -BCR/ABL by quantitative PCR on 07/21/2019 was negative.  2.  CKD: -Ultrasound of the abdomen on 06/18/2019 shows kidneys slightly small and echogenic as seen previously with no obstruction.  Bilateral pleural effusions.  3.  Bradycardia: -Mobitz type II AV block.  Pacemaker placed on 05/06/2018.  PLAN:  1.  CML in chronic phase: -At last visit on 07/28/2019, we have discontinued dasatinib 100 mg because of recurrent pleural effusions. -I have cut back on dasatinib to 50 mg daily.  She received a pill on 08/12/2019. -She will start the pill today. -I reviewed labs from 08/11/2019.  White count is 4.3.  Platelet count is 251.  Hemoglobin is 10.9. -I plan to see her back in 2 months for follow-up with repeat labs.  2.  CKD: -Creatinine today improved to 1.19.  3.  Hypertension: -Blood pressure today is 108/65.  Continue losartan 100 mg daily, hydralazine 25 mg daily and Norvasc 10 mg daily.  4.  Constipation: -She is taking stool softener and uses MiraLAX as needed.  If any worsening, consider lactulose.  5.  Normocytic anemia: -Last Feraheme on 05/26/2019 and 06/04/2019. -Hemoglobin today is 10.9.  Ferritin is 502.  Percent saturation is  18.  B12 is normal. -I will repeat labs in 2 months.  If there is any worsening of hemoglobin will consider parenteral iron therapy.    Orders placed this encounter:  No orders of the defined types were placed in this encounter.    Derek Jack, MD, 08/13/19 8:38 AM  [place] [phone]

## 2019-08-16 LAB — BCR-ABL1, CML/ALL, PCR, QUANT: Interpretation (BCRAL):: NEGATIVE

## 2019-08-18 ENCOUNTER — Ambulatory Visit (HOSPITAL_COMMUNITY): Payer: Medicare HMO | Admitting: Hematology

## 2019-09-08 MED FILL — SPRYCEL 50 MG TABLET: 50 | 30 days supply | Qty: 30 | Fill #1

## 2019-09-10 ENCOUNTER — Encounter: Payer: Self-pay | Admitting: Podiatry

## 2019-09-10 ENCOUNTER — Other Ambulatory Visit: Payer: Self-pay

## 2019-09-10 ENCOUNTER — Ambulatory Visit: Payer: Medicare HMO | Admitting: Podiatry

## 2019-09-10 DIAGNOSIS — E118 Type 2 diabetes mellitus with unspecified complications: Secondary | ICD-10-CM | POA: Diagnosis not present

## 2019-09-10 DIAGNOSIS — M79674 Pain in right toe(s): Secondary | ICD-10-CM | POA: Diagnosis not present

## 2019-09-10 DIAGNOSIS — M79675 Pain in left toe(s): Secondary | ICD-10-CM | POA: Diagnosis not present

## 2019-09-10 DIAGNOSIS — B351 Tinea unguium: Secondary | ICD-10-CM

## 2019-09-10 NOTE — Progress Notes (Signed)
This patient returns to my office for at risk foot care.  This patient requires this care by a professional since this patient will be at risk due to having diabetes type 2 and CVA.   This patient is unable to cut nails herself since the patient cannot reach her nails.These nails are painful walking and wearing shoes.  This patient presents for at risk foot care today. She presents to the office with her daughter.    General Appearance  Alert, conversant and in no acute stress.  Vascular  Dorsalis pedis and posterior tibial  pulses are palpable  bilaterally.  Capillary return is within normal limits  bilaterally. Temperature is within normal limits  bilaterally.  Neurologic  Senn-Weinstein monofilament wire test within normal limits  bilaterally. Muscle power within normal limits bilaterally.  Nails Thick disfigured discolored nails with subungual debris  from hallux to fifth toes bilaterally. No evidence of bacterial infection or drainage bilaterally.  Orthopedic  No limitations of motion  feet .  No crepitus or effusions noted.  No bony pathology or digital deformities noted.  HAV  B/L.  Hammer toes 2-5  B/L.  Skin  normotropic skin with no porokeratosis noted bilaterally.  No signs of infections or ulcers noted.    Onychomycosis  Pain in right toes  Pain in left toes    Consent was obtained for treatment procedures.   Mechanical debridement of nails 1-5  bilaterally performed with a nail nipper.  Filed with dremel without incident.   Return office visit   3 months                  Told patient to return for periodic foot care and evaluation due to potential at risk complications.   Gardiner Barefoot DPM

## 2019-10-06 ENCOUNTER — Inpatient Hospital Stay (HOSPITAL_COMMUNITY): Payer: Medicare HMO | Attending: Hematology

## 2019-10-06 ENCOUNTER — Other Ambulatory Visit: Payer: Self-pay

## 2019-10-06 DIAGNOSIS — C9210 Chronic myeloid leukemia, BCR/ABL-positive, not having achieved remission: Secondary | ICD-10-CM | POA: Diagnosis not present

## 2019-10-06 LAB — CBC WITH DIFFERENTIAL/PLATELET
Abs Immature Granulocytes: 0.01 10*3/uL (ref 0.00–0.07)
Basophils Absolute: 0 10*3/uL (ref 0.0–0.1)
Basophils Relative: 1 %
Eosinophils Absolute: 0 10*3/uL (ref 0.0–0.5)
Eosinophils Relative: 1 %
HCT: 32.9 % — ABNORMAL LOW (ref 36.0–46.0)
Hemoglobin: 10.1 g/dL — ABNORMAL LOW (ref 12.0–15.0)
Immature Granulocytes: 0 %
Lymphocytes Relative: 53 %
Lymphs Abs: 1.7 10*3/uL (ref 0.7–4.0)
MCH: 29.8 pg (ref 26.0–34.0)
MCHC: 30.7 g/dL (ref 30.0–36.0)
MCV: 97.1 fL (ref 80.0–100.0)
Monocytes Absolute: 0.4 10*3/uL (ref 0.1–1.0)
Monocytes Relative: 11 %
Neutro Abs: 1.1 10*3/uL — ABNORMAL LOW (ref 1.7–7.7)
Neutrophils Relative %: 34 %
Platelets: 232 10*3/uL (ref 150–400)
RBC: 3.39 MIL/uL — ABNORMAL LOW (ref 3.87–5.11)
RDW: 14.8 % (ref 11.5–15.5)
WBC: 3.3 10*3/uL — ABNORMAL LOW (ref 4.0–10.5)
nRBC: 0 % (ref 0.0–0.2)

## 2019-10-06 LAB — COMPREHENSIVE METABOLIC PANEL
ALT: 34 U/L (ref 0–44)
AST: 40 U/L (ref 15–41)
Albumin: 3.7 g/dL (ref 3.5–5.0)
Alkaline Phosphatase: 116 U/L (ref 38–126)
Anion gap: 9 (ref 5–15)
BUN: 37 mg/dL — ABNORMAL HIGH (ref 8–23)
CO2: 24 mmol/L (ref 22–32)
Calcium: 9 mg/dL (ref 8.9–10.3)
Chloride: 104 mmol/L (ref 98–111)
Creatinine, Ser: 1.51 mg/dL — ABNORMAL HIGH (ref 0.44–1.00)
GFR calc Af Amer: 39 mL/min — ABNORMAL LOW (ref >60)
GFR calc non Af Amer: 34 mL/min — ABNORMAL LOW (ref >60)
Glucose, Bld: 93 mg/dL (ref 70–99)
Potassium: 4.7 mmol/L (ref 3.5–5.1)
Sodium: 137 mmol/L (ref 135–145)
Total Bilirubin: 0.4 mg/dL (ref 0.3–1.2)
Total Protein: 7.4 g/dL (ref 6.5–8.1)

## 2019-10-06 LAB — IRON AND TIBC
Iron: 39 ug/dL (ref 28–170)
Saturation Ratios: 14 % (ref 10.4–31.8)
TIBC: 282 ug/dL (ref 250–450)
UIBC: 243 ug/dL

## 2019-10-06 LAB — FERRITIN: Ferritin: 679 ng/mL — ABNORMAL HIGH (ref 11–307)

## 2019-10-10 LAB — BCR-ABL1, CML/ALL, PCR, QUANT: Interpretation (BCRAL):: NEGATIVE

## 2019-10-13 ENCOUNTER — Other Ambulatory Visit: Payer: Self-pay

## 2019-10-13 ENCOUNTER — Inpatient Hospital Stay (HOSPITAL_COMMUNITY): Payer: Medicare HMO | Attending: Nurse Practitioner | Admitting: Hematology

## 2019-10-13 VITALS — BP 114/53 | HR 87 | Temp 97.3°F | Resp 16 | Wt 113.6 lb

## 2019-10-13 DIAGNOSIS — D649 Anemia, unspecified: Secondary | ICD-10-CM | POA: Diagnosis not present

## 2019-10-13 DIAGNOSIS — Z8673 Personal history of transient ischemic attack (TIA), and cerebral infarction without residual deficits: Secondary | ICD-10-CM | POA: Diagnosis not present

## 2019-10-13 DIAGNOSIS — E669 Obesity, unspecified: Secondary | ICD-10-CM | POA: Diagnosis not present

## 2019-10-13 DIAGNOSIS — K219 Gastro-esophageal reflux disease without esophagitis: Secondary | ICD-10-CM | POA: Insufficient documentation

## 2019-10-13 DIAGNOSIS — Z79899 Other long term (current) drug therapy: Secondary | ICD-10-CM | POA: Diagnosis not present

## 2019-10-13 DIAGNOSIS — Z7982 Long term (current) use of aspirin: Secondary | ICD-10-CM | POA: Diagnosis not present

## 2019-10-13 DIAGNOSIS — N189 Chronic kidney disease, unspecified: Secondary | ICD-10-CM | POA: Insufficient documentation

## 2019-10-13 DIAGNOSIS — Z8261 Family history of arthritis: Secondary | ICD-10-CM | POA: Diagnosis not present

## 2019-10-13 DIAGNOSIS — C9210 Chronic myeloid leukemia, BCR/ABL-positive, not having achieved remission: Secondary | ICD-10-CM | POA: Diagnosis not present

## 2019-10-13 DIAGNOSIS — I10 Essential (primary) hypertension: Secondary | ICD-10-CM | POA: Diagnosis not present

## 2019-10-13 DIAGNOSIS — Z8249 Family history of ischemic heart disease and other diseases of the circulatory system: Secondary | ICD-10-CM | POA: Insufficient documentation

## 2019-10-13 DIAGNOSIS — Z8349 Family history of other endocrine, nutritional and metabolic diseases: Secondary | ICD-10-CM | POA: Diagnosis not present

## 2019-10-13 DIAGNOSIS — E785 Hyperlipidemia, unspecified: Secondary | ICD-10-CM | POA: Insufficient documentation

## 2019-10-13 NOTE — Progress Notes (Signed)
[address] [place], [place] [address]   CLINIC:  Medical Oncology/Hematology  PCP:  [facility], [address] [address] / [place] [address]  [phone]  REASON FOR VISIT:  Follow-up for [facility]  PRIOR THERAPY: Nilotinib from 07/2014 to 09/2016  CURRENT THERAPY: Dasatinib 50 mg  INTERVAL HISTORY:  Ms. April Page, a 74 y.o. female, returns for routine follow-up for her CML. April Page was last seen on 08/13/2019.  Today April Page is accompanied by her caretaker. April Page reports that April Page is tolerating the dasatinib well and her breathing has improved. April Page needed to have a thoracentesis on her right side on 5/4 and had 650 mL of fluid removed. April Page is on Miralax and having regular BM's. April Page is drinking 4 cans of Ensure daily and eating less during the day.   REVIEW OF SYSTEMS:  Review of Systems  Constitutional: Positive for appetite change (severely decreased) and fatigue (severe).  HENT:   Positive for trouble swallowing.   Genitourinary: Positive for difficulty urinating (urinates once daily).   Psychiatric/Behavioral: Positive for depression. The patient is nervous/anxious.   All other systems reviewed and are negative.   PAST MEDICAL/SURGICAL HISTORY:  Past Medical History:  Diagnosis Date  . Arthritis    stiff knees  . Blood dyscrasia   . Cancer ([place])   . CVA (cerebral vascular accident) ([place])   . GERD (gastroesophageal reflux disease)   . H/O: CML (chronic myeloid leukemia)   . Hyperlipidemia   . Hyperlipidemia   . Hypertension   . Iron deficiency anemia 07/15/2018  . Obesity   . Pessary maintenance 07/01/2013  . Stroke ([place])    x 3   Past Surgical History:  Procedure Laterality Date  . ABDOMINAL HYSTERECTOMY    . BIOPSY  08/20/2018   Procedure: BIOPSY;  Surgeon: [name], MD;  [facility];  Service: Endoscopy;;  gastric  . CHOLECYSTECTOMY    . COLONOSCOPY  06/09/2002   CHE:NIDPOEUM hemorrhoids; otherwise normal  rectum, colon   . COLONOSCOPY N/A 08/25/2013   PNT:IRWERXV diverticulosis. Single colonic polyp-removed  s/p segmental biopsy and stool sample. random colon bx negative. +benign leiomyoma.  . cystocele/rectocele repair  2009  . ESOPHAGOGASTRODUODENOSCOPY  06/09/2002   QMG:QQPYPP upper gastrointestinal tract s/p  54-French Maloney dilator  . ESOPHAGOGASTRODUODENOSCOPY (EGD) WITH PROPOFOL N/A 08/20/2018   Dr. Gala Romney: erosive reflux esophagitis, gastritis but no h.pylori, hiatal hernia, s/p esophageal dilation  . FEMUR IM NAIL Right 04/12/2016   Procedure: INTRAMEDULLARY (IM) NAIL FEMORAL;  Surgeon: Nicholes Stairs, MD;  Location: [facility];  Service: Orthopedics;  Laterality: Right;  . HERNIA REPAIR    . IR THORACENTESIS ASP PLEURAL SPACE W/IMG GUIDE  05/07/2018  . IR THORACENTESIS ASP PLEURAL SPACE W/IMG GUIDE  07/13/2019  . LOOP RECORDER INSERTION N/A 08/23/2016   Procedure: Loop Recorder Insertion;  Surgeon: Constance Haw, MD;  Location: [facility];  Service: Cardiovascular;  Laterality: N/A;  . LOOP RECORDER REMOVAL N/A 05/07/2018   Procedure: LOOP RECORDER REMOVAL;  Surgeon: Constance Haw, MD;  Location: [facility];  Service: Cardiovascular;  Laterality: N/A;  . Venia Minks DILATION N/A 08/20/2018   Procedure: Venia Minks DILATION;  Surgeon: [name], MD;  [facility];  Service: Endoscopy;  Laterality: N/A;  . OOPHORECTOMY    . PACEMAKER IMPLANT N/A 05/07/2018   Procedure: PACEMAKER IMPLANT;  Surgeon: Constance Haw, MD;  Location: [facility];  Service: Cardiovascular;  Laterality: N/A;  . TEE WITHOUT CARDIOVERSION N/A 08/23/2016   Procedure: TRANSESOPHAGEAL ECHOCARDIOGRAM (TEE);  Surgeon: Fay Records, MD;  Location[facility];  Service: Cardiovascular;  Laterality: N/A;    SOCIAL HISTORY:  Social History   Socioeconomic History  . Marital status: Widowed    Spouse name: Not on file  . Number of children: 6  . Years of education:  associate  . Highest education level: Not on file  Occupational History    Employer: RETIRED  Tobacco Use  . Smoking status: Never Smoker  . Smokeless tobacco: Never Used  Vaping Use  . Vaping Use: Never used  Substance and Sexual Activity  . Alcohol use: No  . Drug use: No  . Sexual activity: Not Currently    Birth control/protection: Surgical    Comment: hyst  Other Topics Concern  . Not on file  Social History Narrative  . Not on file   Social Determinants of Health   Financial Resource Strain:   . Difficulty of Paying Living Expenses:   Food Insecurity:   . Worried About Charity fundraiser in the Last Year:   . Arboriculturist in the Last Year:   Transportation Needs:   . Film/video editor (Medical):   Marland Kitchen Lack of Transportation (Non-Medical):   Physical Activity:   . Days of Exercise per Week:   . Minutes of Exercise per Session:   Stress:   . Feeling of Stress :   Social Connections:   . Frequency of Communication with Friends and Family:   . Frequency of Social Gatherings with Friends and Family:   . Attends Religious Services:   . Active Member of Clubs or Organizations:   . Attends Archivist Meetings:   Marland Kitchen Marital Status:   Intimate Partner Violence:   . Fear of Current or Ex-Partner:   . Emotionally Abused:   Marland Kitchen Physically Abused:   . Sexually Abused:     FAMILY HISTORY:  Family History  Problem Relation Age of Onset  . Anuerysm Mother        deceased age 55, brain anuerysm  . Early death Mother 103  . Heart disease Father   . Hypertension Sister   . Obesity Brother   . Hypertension Sister   . Arthritis Sister   . Other Son        cardiac arrest  . Heart disease Child 49       cardiac arrest  . Colon cancer Neg Hx     CURRENT MEDICATIONS:  Current Outpatient Medications  Medication Sig Dispense Refill  . amLODipine (NORVASC) 10 MG tablet Take 1 tablet (10 mg total) by mouth daily. 30 tablet 0  . aspirin 325 MG tablet Take  325 mg by mouth daily.    Marland Kitchen atorvastatin (LIPITOR) 40 MG tablet Take 1 tablet (40 mg total) by mouth daily at 6 PM. 30 tablet 0  . dasatinib (SPRYCEL) 50 MG tablet Take 1 tablet (50 mg total) by mouth daily. 30 tablet 3  . feeding supplement, ENSURE ENLIVE, (ENSURE ENLIVE) LIQD Take 237 mLs by mouth 2 (two) times daily between meals. 237 mL 12  . furosemide (LASIX) 20 MG tablet Take 20 mg by mouth daily.     . hydrALAZINE (APRESOLINE) 25 MG tablet Take 25 mg by mouth 3 (three) times daily.     Marland Kitchen losartan (COZAAR) 100 MG tablet Take 1 tablet (100 mg total) by mouth daily. Resume on 3/28 (Patient taking  differently: Take 100 mg by mouth daily. ) 30 tablet 0  . Multiple Vitamin (MULTIVITAMIN WITH MINERALS) TABS tablet Take 1 tablet by mouth daily.    . polyethylene glycol (MIRALAX / GLYCOLAX) packet Take 17 g by mouth daily. 14 each 0  . sennosides-docusate sodium (SENOKOT-S) 8.6-50 MG tablet Take 1 tablet by mouth every evening.    Marland Kitchen acetaminophen (TYLENOL) 325 MG tablet Take 2 tablets (650 mg total) by mouth every 6 (six) hours as needed for mild pain (or temp > 37.5 C (99.5 F)). (Patient not taking: Reported on 10/13/2019)     No current facility-administered medications for this visit.    ALLERGIES:  No Known Allergies  PHYSICAL EXAM:  Performance status (ECOG): 2 - Symptomatic, <50% confined to bed  Vitals:   10/13/19 0751  BP: (!) 114/53  Pulse: 87  Resp: 16  Temp: (!) 97.3 F (36.3 C)  SpO2: 100%   Wt Readings from Last 3 Encounters:  10/13/19 113 lb 9.6 oz (51.5 kg)  07/28/19 117 lb 4 oz (53.2 kg)  01/22/19 123 lb (55.8 kg)   Physical Exam Vitals reviewed.  Constitutional:      Appearance: Normal appearance.  Cardiovascular:     Rate and Rhythm: Normal rate and regular rhythm.     Pulses: Normal pulses.     Heart sounds: Normal heart sounds.  Pulmonary:     Effort: Pulmonary effort is normal.     Breath sounds: Examination of the right-lower field reveals decreased  breath sounds. Decreased breath sounds present.  Musculoskeletal:     Right lower leg: No edema.     Left lower leg: No edema.  Neurological:     General: No focal deficit present.     Mental Status: April Page is alert and oriented to person, place, and time.  Psychiatric:        Mood and Affect: Mood normal.        Behavior: Behavior normal.     LABORATORY DATA:  I have reviewed the labs as listed.  CBC Latest Ref Rng & Units 10/06/2019 08/11/2019 07/21/2019  WBC 4.0 - 10.5 K/uL 3.3(L) 4.3 5.7  Hemoglobin 12.0 - 15.0 g/dL 10.1(L) 10.9(L) 11.5(L)  Hematocrit 36 - 46 % 32.9(L) 35.7(L) 37.5  Platelets 150 - 400 K/uL 232 251 248   CMP Latest Ref Rng & Units 10/06/2019 08/11/2019 07/21/2019  Glucose 70 - 99 mg/dL 93 86 93  BUN 8 - 23 mg/dL 37(H) 40(H) 32(H)  Creatinine 0.44 - 1.00 mg/dL 1.51(H) 1.19(H) 1.66(H)  Sodium 135 - 145 mmol/L 137 139 137  Potassium 3.5 - 5.1 mmol/L 4.7 4.7 5.1  Chloride 98 - 111 mmol/L 104 102 102  CO2 22 - 32 mmol/L 24 28 25   Calcium 8.9 - 10.3 mg/dL 9.0 9.5 8.9  Total Protein 6.5 - 8.1 g/dL 7.4 7.7 7.4  Total Bilirubin 0.3 - 1.2 mg/dL 0.4 0.5 0.4  Alkaline Phos 38 - 126 U/L 116 109 114  AST 15 - 41 U/L 40 27 39  ALT 0 - 44 U/L 34 19 24      Component Value Date/Time   RBC 3.39 (L) 10/06/2019 0827   MCV 97.1 10/06/2019 0827   MCH 29.8 10/06/2019 0827   MCHC 30.7 10/06/2019 0827   RDW 14.8 10/06/2019 0827   LYMPHSABS 1.7 10/06/2019 0827   MONOABS 0.4 10/06/2019 0827   EOSABS 0.0 10/06/2019 0827   BASOSABS 0.0 10/06/2019 0827   Lab Results  Component Value Date  TIBC 282 10/06/2019   TIBC 292 08/11/2019   TIBC 286 07/21/2019   FERRITIN 679 (H) 10/06/2019   FERRITIN 502 (H) 08/11/2019   FERRITIN 1,327 (H) 07/21/2019   IRONPCTSAT 14 10/06/2019   IRONPCTSAT 18 08/11/2019   IRONPCTSAT 19 07/21/2019   Lab Results  Component Value Date   LDH 238 (H) 07/21/2019   LDH 275 (H) 05/13/2019   LDH 258 (H) 04/26/2019    DIAGNOSTIC IMAGING:  I have  independently reviewed the scans and discussed with the patient. No results found.   ASSESSMENT:  1.  CML in chronic phase: -Diagnosed in March 2016, treated initially with Nilotinib from May 2016 through July 2018 secondary to recurrent strokes. -Dasatinib 100 mg daily started in February 2019.  April Page had thoracentesis done in February 2020. -April Page again had thoracentesis done on 07/13/2018 on the right side with 650 mL of pink-tinged fluid. -BCR/ABL by quantitative PCR on 07/21/2019 was negative.  2.  CKD: -Ultrasound of the abdomen on 06/18/2019 shows kidneys slightly small and echogenic as seen previously with no obstruction.  Bilateral pleural effusions.  3.  Bradycardia: -Mobitz type II AV block.  Pacemaker placed on 05/06/2018.  PLAN:  1.  CML in chronic phase: -April Page is tolerating dasatinib 50 mg daily very well. -I have reviewed her labs today.  CBC shows mildly decreased white count of 3.3.  Hemoglobin is 10.9.  Platelet count is 232.  Low white count and hemoglobin likely from bone marrow suppression from dasatinib.  BCR/ABL by quantitative PCR on 09/28/2019 was negative. -Patient does have decreased breath sounds on the right lung base, likely from effusion.  We will closely monitor. -We will check her back in 2 months for follow-up with repeat labs.  2.  CKD: -Creatinine today increased to 1.5.  We will closely monitor.  3.  Hypertension: -Continue losartan, hydralazine and Norvasc daily.  4.  Constipation: -Continue MiraLAX as needed.  5.  Normocytic anemia: -Last Feraheme on 06/04/2019.  Hemoglobin is 10.1.  Ferritin is 679.  Percent saturation is 14. -Normocytic anemia from bone marrow suppression and CKD.  6.  Weight loss: -April Page lost about 4 pounds since last visit.  April Page is eating couple of meals a day and drinking about 4 cans of Ensure Plus daily. -I have told her to increase it to 5 cans/day. Orders placed this encounter:  No orders of the defined types were placed  in this encounter.    Derek Jack, MD [facility] [phone]   I, Milinda Antis, am acting as a scribe for Dr. Sanda Linger.  I, Derek Jack MD, have reviewed the above documentation for accuracy and completeness, and I agree with the above.

## 2019-10-13 NOTE — Patient Instructions (Signed)
[place] at [facility] Discharge Instructions  You were seen today by Dr. Delton Coombes. He went over your recent results. Increase your caloric intake to increase your weight, or increase Ensure to 5 cans per day. Dr. Delton Coombes will see you back in 2 months for labs and follow up.   Thank you for choosing [place] at [facility] to provide your oncology and hematology care.  To afford each patient quality time with our provider, please arrive at least 15 minutes before your scheduled appointment time.   If you have a lab appointment with the [facility] please come in thru the Main Entrance and check in at the main information desk  You need to re-schedule your appointment should you arrive 10 or more minutes late.  We strive to give you quality time with our providers, and arriving late affects you and other patients whose appointments are after yours.  Also, if you no show three or more times for appointments you may be dismissed from the clinic at the providers discretion.     Again, thank you for choosing [facility].  Our hope is that these requests will decrease the amount of time that you wait before being seen by our physicians.       _____________________________________________________________  Should you have questions after your visit to [facility], please contact our office at [phone] between the hours of 8:00 a.m. and 4:30 p.m.  Voicemails left after 4:00 p.m. will not be returned until the following business day.  For prescription refill requests, have your pharmacy contact our office and allow 72 hours.    Cancer Center Support Programs:   > Cancer Support Group  2nd Tuesday of the month 1pm-2pm, Journey Room

## 2019-10-14 MED FILL — SPRYCEL 50 MG TABLET: 50 | 30 days supply | Qty: 30 | Fill #2

## 2019-11-03 ENCOUNTER — Ambulatory Visit (INDEPENDENT_AMBULATORY_CARE_PROVIDER_SITE_OTHER): Payer: Medicare HMO | Admitting: *Deleted

## 2019-11-03 DIAGNOSIS — I472 Ventricular tachycardia: Secondary | ICD-10-CM | POA: Diagnosis not present

## 2019-11-03 DIAGNOSIS — I639 Cerebral infarction, unspecified: Secondary | ICD-10-CM

## 2019-11-04 LAB — CUP PACEART REMOTE DEVICE CHECK
Battery Remaining Longevity: 135 mo
Battery Voltage: 3.02 V
Brady Statistic AP VP Percent: 0.57 %
Brady Statistic AP VS Percent: 0 %
Brady Statistic AS VP Percent: 99.25 %
Brady Statistic AS VS Percent: 0.17 %
Brady Statistic RA Percent Paced: 0.58 %
Brady Statistic RV Percent Paced: 99.83 %
Date Time Interrogation Session: 20210825050039
Implantable Lead Implant Date: 20200227
Implantable Lead Implant Date: 20200227
Implantable Lead Location: 753859
Implantable Lead Location: 753860
Implantable Lead Model: 5076
Implantable Lead Model: 5076
Implantable Pulse Generator Implant Date: 20200227
Lead Channel Impedance Value: 228 Ohm
Lead Channel Impedance Value: 285 Ohm
Lead Channel Impedance Value: 361 Ohm
Lead Channel Impedance Value: 418 Ohm
Lead Channel Pacing Threshold Amplitude: 0.5 V
Lead Channel Pacing Threshold Amplitude: 0.625 V
Lead Channel Pacing Threshold Pulse Width: 0.4 ms
Lead Channel Pacing Threshold Pulse Width: 0.4 ms
Lead Channel Sensing Intrinsic Amplitude: 0.75 mV
Lead Channel Sensing Intrinsic Amplitude: 0.75 mV
Lead Channel Sensing Intrinsic Amplitude: 14.75 mV
Lead Channel Sensing Intrinsic Amplitude: 14.75 mV
Lead Channel Setting Pacing Amplitude: 1.5 V
Lead Channel Setting Pacing Amplitude: 2 V
Lead Channel Setting Pacing Pulse Width: 0.4 ms
Lead Channel Setting Sensing Sensitivity: 2 mV

## 2019-11-08 DIAGNOSIS — R069 Unspecified abnormalities of breathing: Secondary | ICD-10-CM | POA: Diagnosis not present

## 2019-11-09 NOTE — Progress Notes (Signed)
Remote pacemaker transmission.   

## 2019-11-18 MED FILL — SPRYCEL 50 MG TABLET: 50 | 30 days supply | Qty: 30 | Fill #3

## 2019-11-22 DIAGNOSIS — I5032 Chronic diastolic (congestive) heart failure: Secondary | ICD-10-CM | POA: Diagnosis not present

## 2019-11-22 DIAGNOSIS — D638 Anemia in other chronic diseases classified elsewhere: Secondary | ICD-10-CM | POA: Diagnosis not present

## 2019-11-22 DIAGNOSIS — N184 Chronic kidney disease, stage 4 (severe): Secondary | ICD-10-CM | POA: Diagnosis not present

## 2019-11-22 DIAGNOSIS — I129 Hypertensive chronic kidney disease with stage 1 through stage 4 chronic kidney disease, or unspecified chronic kidney disease: Secondary | ICD-10-CM | POA: Diagnosis not present

## 2019-11-22 DIAGNOSIS — E559 Vitamin D deficiency, unspecified: Secondary | ICD-10-CM | POA: Diagnosis not present

## 2019-11-22 DIAGNOSIS — E875 Hyperkalemia: Secondary | ICD-10-CM | POA: Diagnosis not present

## 2019-11-22 DIAGNOSIS — J90 Pleural effusion, not elsewhere classified: Secondary | ICD-10-CM | POA: Diagnosis not present

## 2019-11-22 DIAGNOSIS — E211 Secondary hyperparathyroidism, not elsewhere classified: Secondary | ICD-10-CM | POA: Diagnosis not present

## 2019-11-24 DIAGNOSIS — E211 Secondary hyperparathyroidism, not elsewhere classified: Secondary | ICD-10-CM | POA: Diagnosis not present

## 2019-11-24 DIAGNOSIS — N184 Chronic kidney disease, stage 4 (severe): Secondary | ICD-10-CM | POA: Diagnosis not present

## 2019-11-24 DIAGNOSIS — I129 Hypertensive chronic kidney disease with stage 1 through stage 4 chronic kidney disease, or unspecified chronic kidney disease: Secondary | ICD-10-CM | POA: Diagnosis not present

## 2019-11-24 DIAGNOSIS — E559 Vitamin D deficiency, unspecified: Secondary | ICD-10-CM | POA: Diagnosis not present

## 2019-11-24 DIAGNOSIS — J90 Pleural effusion, not elsewhere classified: Secondary | ICD-10-CM | POA: Diagnosis not present

## 2019-11-24 DIAGNOSIS — D638 Anemia in other chronic diseases classified elsewhere: Secondary | ICD-10-CM | POA: Diagnosis not present

## 2019-11-24 DIAGNOSIS — I5032 Chronic diastolic (congestive) heart failure: Secondary | ICD-10-CM | POA: Diagnosis not present

## 2019-11-24 DIAGNOSIS — E875 Hyperkalemia: Secondary | ICD-10-CM | POA: Diagnosis not present

## 2019-11-24 DIAGNOSIS — N1831 Chronic kidney disease, stage 3a: Secondary | ICD-10-CM | POA: Diagnosis not present

## 2019-12-17 ENCOUNTER — Other Ambulatory Visit: Payer: Self-pay

## 2019-12-17 ENCOUNTER — Encounter: Payer: Self-pay | Admitting: Podiatry

## 2019-12-17 ENCOUNTER — Ambulatory Visit: Payer: Medicare HMO | Admitting: Podiatry

## 2019-12-17 DIAGNOSIS — B351 Tinea unguium: Secondary | ICD-10-CM | POA: Diagnosis not present

## 2019-12-17 DIAGNOSIS — M79675 Pain in left toe(s): Secondary | ICD-10-CM

## 2019-12-17 DIAGNOSIS — E118 Type 2 diabetes mellitus with unspecified complications: Secondary | ICD-10-CM

## 2019-12-17 DIAGNOSIS — M79674 Pain in right toe(s): Secondary | ICD-10-CM

## 2019-12-17 NOTE — Progress Notes (Signed)
This patient returns to my office for at risk foot care.  This patient requires this care by a professional since this patient will be at risk due to having diabetes type 2 and CVA.   This patient is unable to cut nails herself since the patient cannot reach her nails.These nails are painful walking and wearing shoes.  This patient presents for at risk foot care today. She presents to the office with her daughter.    General Appearance  Alert, conversant and in no acute stress.  Vascular  Dorsalis pedis and posterior tibial  pulses are weakly  palpable  bilaterally.  Capillary return is within normal limits  bilaterally. Temperature is within normal limits  bilaterally.  Neurologic  Senn-Weinstein monofilament wire test diminished   bilaterally. Muscle power within normal limits bilaterally.  Nails Thick disfigured discolored nails with subungual debris  from hallux to fifth toes bilaterally. No evidence of bacterial infection or drainage bilaterally.  Orthopedic  No limitations of motion  feet .  No crepitus or effusions noted.  No bony pathology or digital deformities noted.  HAV  B/L.  Hammer toes 2-5  B/L.  Skin  normotropic skin with no porokeratosis noted bilaterally.  No signs of infections or ulcers noted.    Onychomycosis  Pain in right toes  Pain in left toes    Consent was obtained for treatment procedures.   Mechanical debridement of nails 1-5  bilaterally performed with a nail nipper.  Filed with dremel without incident.   Return office visit   3 months                  Told patient to return for periodic foot care and evaluation due to potential at risk complications.   Gardiner Barefoot DPM

## 2019-12-27 ENCOUNTER — Other Ambulatory Visit (HOSPITAL_COMMUNITY): Payer: Self-pay | Admitting: Hematology

## 2019-12-27 DIAGNOSIS — C9210 Chronic myeloid leukemia, BCR/ABL-positive, not having achieved remission: Secondary | ICD-10-CM

## 2019-12-27 MED FILL — SPRYCEL 50 MG TABLET: 50 | 30 days supply | Qty: 30 | Fill #0

## 2019-12-28 ENCOUNTER — Other Ambulatory Visit: Payer: Self-pay

## 2019-12-28 ENCOUNTER — Inpatient Hospital Stay (HOSPITAL_COMMUNITY): Payer: Medicare HMO | Attending: Hematology

## 2019-12-28 DIAGNOSIS — K5900 Constipation, unspecified: Secondary | ICD-10-CM | POA: Insufficient documentation

## 2019-12-28 DIAGNOSIS — Z90721 Acquired absence of ovaries, unilateral: Secondary | ICD-10-CM | POA: Diagnosis not present

## 2019-12-28 DIAGNOSIS — R634 Abnormal weight loss: Secondary | ICD-10-CM | POA: Diagnosis not present

## 2019-12-28 DIAGNOSIS — Z79899 Other long term (current) drug therapy: Secondary | ICD-10-CM | POA: Diagnosis not present

## 2019-12-28 DIAGNOSIS — Z8249 Family history of ischemic heart disease and other diseases of the circulatory system: Secondary | ICD-10-CM | POA: Diagnosis not present

## 2019-12-28 DIAGNOSIS — Z9049 Acquired absence of other specified parts of digestive tract: Secondary | ICD-10-CM | POA: Insufficient documentation

## 2019-12-28 DIAGNOSIS — I441 Atrioventricular block, second degree: Secondary | ICD-10-CM | POA: Diagnosis not present

## 2019-12-28 DIAGNOSIS — N189 Chronic kidney disease, unspecified: Secondary | ICD-10-CM | POA: Insufficient documentation

## 2019-12-28 DIAGNOSIS — Z8261 Family history of arthritis: Secondary | ICD-10-CM | POA: Diagnosis not present

## 2019-12-28 DIAGNOSIS — Z8673 Personal history of transient ischemic attack (TIA), and cerebral infarction without residual deficits: Secondary | ICD-10-CM | POA: Diagnosis not present

## 2019-12-28 DIAGNOSIS — C9210 Chronic myeloid leukemia, BCR/ABL-positive, not having achieved remission: Secondary | ICD-10-CM | POA: Insufficient documentation

## 2019-12-28 DIAGNOSIS — J90 Pleural effusion, not elsewhere classified: Secondary | ICD-10-CM | POA: Diagnosis not present

## 2019-12-28 DIAGNOSIS — L8990 Pressure ulcer of unspecified site, unspecified stage: Secondary | ICD-10-CM | POA: Insufficient documentation

## 2019-12-28 DIAGNOSIS — R001 Bradycardia, unspecified: Secondary | ICD-10-CM | POA: Insufficient documentation

## 2019-12-28 DIAGNOSIS — R7401 Elevation of levels of liver transaminase levels: Secondary | ICD-10-CM | POA: Diagnosis not present

## 2019-12-28 DIAGNOSIS — D649 Anemia, unspecified: Secondary | ICD-10-CM | POA: Diagnosis not present

## 2019-12-28 DIAGNOSIS — Z8349 Family history of other endocrine, nutritional and metabolic diseases: Secondary | ICD-10-CM | POA: Diagnosis not present

## 2019-12-28 DIAGNOSIS — Z8719 Personal history of other diseases of the digestive system: Secondary | ICD-10-CM | POA: Insufficient documentation

## 2019-12-28 LAB — VITAMIN D 25 HYDROXY (VIT D DEFICIENCY, FRACTURES): Vit D, 25-Hydroxy: 38.25 ng/mL (ref 30–100)

## 2019-12-28 LAB — CBC WITH DIFFERENTIAL/PLATELET
Abs Immature Granulocytes: 0 10*3/uL (ref 0.00–0.07)
Basophils Absolute: 0 10*3/uL (ref 0.0–0.1)
Basophils Relative: 0 %
Eosinophils Absolute: 0 10*3/uL (ref 0.0–0.5)
Eosinophils Relative: 1 %
HCT: 31.2 % — ABNORMAL LOW (ref 36.0–46.0)
Hemoglobin: 9.7 g/dL — ABNORMAL LOW (ref 12.0–15.0)
Immature Granulocytes: 0 %
Lymphocytes Relative: 55 %
Lymphs Abs: 1.9 10*3/uL (ref 0.7–4.0)
MCH: 30 pg (ref 26.0–34.0)
MCHC: 31.1 g/dL (ref 30.0–36.0)
MCV: 96.6 fL (ref 80.0–100.0)
Monocytes Absolute: 0.4 10*3/uL (ref 0.1–1.0)
Monocytes Relative: 12 %
Neutro Abs: 1.1 10*3/uL — ABNORMAL LOW (ref 1.7–7.7)
Neutrophils Relative %: 32 %
Platelets: 238 10*3/uL (ref 150–400)
RBC: 3.23 MIL/uL — ABNORMAL LOW (ref 3.87–5.11)
RDW: 15.4 % (ref 11.5–15.5)
WBC: 3.5 10*3/uL — ABNORMAL LOW (ref 4.0–10.5)
nRBC: 0 % (ref 0.0–0.2)

## 2019-12-28 LAB — COMPREHENSIVE METABOLIC PANEL
ALT: 32 U/L (ref 0–44)
AST: 43 U/L — ABNORMAL HIGH (ref 15–41)
Albumin: 3.5 g/dL (ref 3.5–5.0)
Alkaline Phosphatase: 98 U/L (ref 38–126)
Anion gap: 8 (ref 5–15)
BUN: 29 mg/dL — ABNORMAL HIGH (ref 8–23)
CO2: 26 mmol/L (ref 22–32)
Calcium: 9 mg/dL (ref 8.9–10.3)
Chloride: 104 mmol/L (ref 98–111)
Creatinine, Ser: 1.38 mg/dL — ABNORMAL HIGH (ref 0.44–1.00)
GFR, Estimated: 38 mL/min — ABNORMAL LOW (ref >60)
Glucose, Bld: 99 mg/dL (ref 70–99)
Potassium: 5 mmol/L (ref 3.5–5.1)
Sodium: 138 mmol/L (ref 135–145)
Total Bilirubin: 0.5 mg/dL (ref 0.3–1.2)
Total Protein: 7.4 g/dL (ref 6.5–8.1)

## 2019-12-28 LAB — IRON AND TIBC
Iron: 35 ug/dL (ref 28–170)
Saturation Ratios: 13 % (ref 10.4–31.8)
TIBC: 276 ug/dL (ref 250–450)
UIBC: 241 ug/dL

## 2019-12-28 LAB — VITAMIN B12: Vitamin B-12: 902 pg/mL (ref 180–914)

## 2019-12-28 LAB — FERRITIN: Ferritin: 585 ng/mL — ABNORMAL HIGH (ref 11–307)

## 2019-12-30 ENCOUNTER — Inpatient Hospital Stay (HOSPITAL_COMMUNITY): Payer: Medicare HMO

## 2020-01-02 LAB — BCR-ABL1, CML/ALL, PCR, QUANT: Interpretation (BCRAL):: NEGATIVE

## 2020-01-06 ENCOUNTER — Other Ambulatory Visit: Payer: Self-pay

## 2020-01-06 ENCOUNTER — Inpatient Hospital Stay (HOSPITAL_BASED_OUTPATIENT_CLINIC_OR_DEPARTMENT_OTHER): Payer: Medicare HMO | Admitting: Hematology

## 2020-01-06 VITALS — BP 115/54 | HR 68 | Temp 97.0°F | Resp 16

## 2020-01-06 DIAGNOSIS — R7401 Elevation of levels of liver transaminase levels: Secondary | ICD-10-CM | POA: Diagnosis not present

## 2020-01-06 DIAGNOSIS — N189 Chronic kidney disease, unspecified: Secondary | ICD-10-CM | POA: Diagnosis not present

## 2020-01-06 DIAGNOSIS — D649 Anemia, unspecified: Secondary | ICD-10-CM | POA: Diagnosis not present

## 2020-01-06 DIAGNOSIS — R001 Bradycardia, unspecified: Secondary | ICD-10-CM | POA: Diagnosis not present

## 2020-01-06 DIAGNOSIS — C9210 Chronic myeloid leukemia, BCR/ABL-positive, not having achieved remission: Secondary | ICD-10-CM

## 2020-01-06 DIAGNOSIS — L8990 Pressure ulcer of unspecified site, unspecified stage: Secondary | ICD-10-CM | POA: Diagnosis not present

## 2020-01-06 DIAGNOSIS — K5900 Constipation, unspecified: Secondary | ICD-10-CM | POA: Diagnosis not present

## 2020-01-06 DIAGNOSIS — J90 Pleural effusion, not elsewhere classified: Secondary | ICD-10-CM | POA: Diagnosis not present

## 2020-01-06 DIAGNOSIS — I441 Atrioventricular block, second degree: Secondary | ICD-10-CM | POA: Diagnosis not present

## 2020-01-06 NOTE — Progress Notes (Signed)
[name], [place] [address]   CLINIC:  Medical Oncology/Hematology  PCP:  [facility], [place] / [place] [address]  [phone]  REASON FOR VISIT:  Follow-up for [facility]  PRIOR THERAPY: Nilotinib from 07/2014 to 09/2016  CURRENT THERAPY: Dasatinib 50 mg  INTERVAL HISTORY:  [name], a 74 y.o. female, returns for routine follow-up for her CML. April Page was last seen on 10/13/2019.  Today she is accompanied by her caretaker and she reports feeling okay. She denies having melena, hematochezia or hematuria. She is tolerating the dasatinib well and denies N/V/D. She was weighed at Dr. Toya Smothers office and her weight was down 1 lb. She drinks 3 cans of Ensure daily.  She spends majority of the day sitting and has developed a pressure ulcer. She uses a walker to ambulate to bathroom PRN. She is not currently in therapy.   REVIEW OF SYSTEMS:  Review of Systems  Constitutional: Positive for appetite change (depleted). Negative for fatigue.  Gastrointestinal: Negative for blood in stool, diarrhea, nausea and vomiting.  Genitourinary: Negative for hematuria.   Musculoskeletal: Negative for arthralgias.  All other systems reviewed and are negative.   PAST MEDICAL/SURGICAL HISTORY:  Past Medical History:  Diagnosis Date  . Arthritis    stiff knees  . Blood dyscrasia   . Cancer ([name])   . CVA (cerebral vascular accident) ([place])   . GERD (gastroesophageal reflux disease)   . H/O: CML (chronic myeloid leukemia)   . Hyperlipidemia   . Hyperlipidemia   . Hypertension   . Iron deficiency anemia 07/15/2018  . Obesity   . Pessary maintenance 07/01/2013  . Stroke ([place])    x 3   Past Surgical History:  Procedure Laterality Date  . ABDOMINAL HYSTERECTOMY    . BIOPSY  08/20/2018   Procedure: BIOPSY;  Surgeon: [name], MD;  [facility];  Service: Endoscopy;;  gastric  . CHOLECYSTECTOMY    . COLONOSCOPY   06/09/2002   OEV:[id number] hemorrhoids; otherwise normal rectum, colon   . COLONOSCOPY N/A 08/25/2013   HWE:[id number] diverticulosis. Single colonic polyp-removed  s/p segmental biopsy and stool sample. random colon bx negative. +benign leiomyoma.  . cystocele/rectocele repair  2009  . ESOPHAGOGASTRODUODENOSCOPY  06/09/2002   CVE:LFYBOF upper gastrointestinal tract s/p  54-French Maloney dilator  . ESOPHAGOGASTRODUODENOSCOPY (EGD) WITH PROPOFOL N/A 08/20/2018   Dr. Gala Romney: erosive reflux esophagitis, gastritis but no h.pylori, hiatal hernia, s/p esophageal dilation  . FEMUR IM NAIL Right 04/12/2016   Procedure: INTRAMEDULLARY (IM) NAIL FEMORAL;  Surgeon: Nicholes Stairs, MD;  Location: [facility];  Service: Orthopedics;  Laterality: Right;  . HERNIA REPAIR    . IR THORACENTESIS ASP PLEURAL SPACE W/IMG GUIDE  05/07/2018  . IR THORACENTESIS ASP PLEURAL SPACE W/IMG GUIDE  07/13/2019  . LOOP RECORDER INSERTION N/A 08/23/2016   Procedure: Loop Recorder Insertion;  Surgeon: Constance Haw, MD;  Location: [facility] CV LAB;  Service: Cardiovascular;  Laterality: N/A;  . LOOP RECORDER REMOVAL N/A 05/07/2018   Procedure: LOOP RECORDER REMOVAL;  Surgeon: Constance Haw, MD;  Location: [facility] CV LAB;  Service: Cardiovascular;  Laterality: N/A;  . Venia Minks DILATION N/A 08/20/2018   Procedure: Venia Minks DILATION;  Surgeon: [name], MD;  [facility];  Service: Endoscopy;  Laterality: N/A;  . OOPHORECTOMY    . PACEMAKER IMPLANT N/A 05/07/2018   Procedure: PACEMAKER IMPLANT;  Surgeon: Constance Haw, MD;  Location:  [facility]AS[facility] LAB;  Service: Cardiovascular;  Laterality: N/A;  . TEE WITHOUT CARDIOVERSION N/A 08/23/2016   Procedure: TRANSESOPHAGEAL ECHOCARDIOGRAM (TEE);  Surgeon: Fay Records, MD;  Location: [facility] ENDOSCOPY;  Service: Cardiovascular;  Laterality: N/A;    SOCIAL HISTORY:  Social History   Socioeconomic History  . Marital status: Widowed    Spouse name: Not on  file  . Number of children: 6  . Years of education: associate  . Highest education level: Not on file  Occupational History    Employer: RETIRED  Tobacco Use  . Smoking status: Never Smoker  . Smokeless tobacco: Never Used  Vaping Use  . Vaping Use: Never used  Substance and Sexual Activity  . Alcohol use: No  . Drug use: No  . Sexual activity: Not Currently    Birth control/protection: Surgical    Comment: hyst  Other Topics Concern  . Not on file  Social History Narrative  . Not on file   Social Determinants of Health   Financial Resource Strain:   . Difficulty of Paying Living Expenses: Not on file  Food Insecurity:   . Worried About Charity fundraiser in the Last Year: Not on file  . Ran Out of Food in the Last Year: Not on file  Transportation Needs:   . Lack of Transportation (Medical): Not on file  . Lack of Transportation (Non-Medical): Not on file  Physical Activity:   . Days of Exercise per Week: Not on file  . Minutes of Exercise per Session: Not on file  Stress:   . Feeling of Stress : Not on file  Social Connections:   . Frequency of Communication with Friends and Family: Not on file  . Frequency of Social Gatherings with Friends and Family: Not on file  . Attends Religious Services: Not on file  . Active Member of Clubs or Organizations: Not on file  . Attends Archivist Meetings: Not on file  . Marital Status: Not on file  Intimate Partner Violence:   . Fear of Current or Ex-Partner: Not on file  . Emotionally Abused: Not on file  . Physically Abused: Not on file  . Sexually Abused: Not on file    FAMILY HISTORY:  Family History  Problem Relation Age of Onset  . Anuerysm Mother        deceased age 26, brain anuerysm  . Early death Mother 52  . Heart disease Father   . Hypertension Sister   . Obesity Brother   . Hypertension Sister   . Arthritis Sister   . Other Son        cardiac arrest  . Heart disease Child 76       cardiac  arrest  . Colon cancer Neg Hx     CURRENT MEDICATIONS:  Current Outpatient Medications  Medication Sig Dispense Refill  . acetaminophen (TYLENOL) 325 MG tablet Take 2 tablets (650 mg total) by mouth every 6 (six) hours as needed for mild pain (or temp > 37.5 C (99.5 F)).    Marland Kitchen amLODipine (NORVASC) 10 MG tablet Take 1 tablet (10 mg total) by mouth daily. 30 tablet 0  . aspirin 325 MG tablet Take 325 mg by mouth daily.    Marland Kitchen atorvastatin (LIPITOR) 40 MG tablet Take 1 tablet (40 mg total) by mouth daily at 6 PM. 30 tablet 0  . feeding supplement, ENSURE ENLIVE, (ENSURE ENLIVE) LIQD Take 237 mLs by mouth 2 (two) times daily between meals. [place]  mL 12  . furosemide (LASIX) 20 MG tablet Take 20 mg by mouth daily.     . hydrALAZINE (APRESOLINE) 25 MG tablet Take 25 mg by mouth 3 (three) times daily.     Marland Kitchen losartan (COZAAR) 100 MG tablet Take 1 tablet (100 mg total) by mouth daily. Resume on 3/28 (Patient taking differently: Take 100 mg by mouth daily. ) 30 tablet 0  . Multiple Vitamin (MULTIVITAMIN WITH MINERALS) TABS tablet Take 1 tablet by mouth daily.    . polyethylene glycol (MIRALAX / GLYCOLAX) packet Take 17 g by mouth daily. 14 each 0  . sennosides-docusate sodium (SENOKOT-S) 8.6-50 MG tablet Take 1 tablet by mouth every evening.    . SPRYCEL 50 MG tablet TAKE 1 TABLET (50 MG TOTAL) BY MOUTH DAILY. 30 tablet 3   No current facility-administered medications for this visit.    ALLERGIES:  No Known Allergies  PHYSICAL EXAM:  Performance status (ECOG): 2 - Symptomatic, <50% confined to bed  Vitals:   01/06/20 0758  BP: (!) 115/54  Pulse: 68  Resp: 16  Temp: (!) 97 F (36.1 C)  SpO2: 100%   Wt Readings from Last 3 Encounters:  10/13/19 113 lb 9.6 oz (51.5 kg)  07/28/19 117 lb 4 oz (53.2 kg)  01/22/19 123 lb (55.8 kg)   Physical Exam Vitals reviewed.  Constitutional:      Appearance: Normal appearance.  Cardiovascular:     Rate and Rhythm: Normal rate and regular rhythm.      Pulses: Normal pulses.     Heart sounds: Normal heart sounds.  Pulmonary:     Effort: Pulmonary effort is normal.     Breath sounds: Normal breath sounds.  Abdominal:     Palpations: Abdomen is soft. There is no mass.     Tenderness: There is no abdominal tenderness.  Neurological:     General: No focal deficit present.     Mental Status: She is alert and oriented to person, place, and time.  Psychiatric:        Mood and Affect: Mood normal.        Behavior: Behavior normal.     LABORATORY DATA:  I have reviewed the labs as listed.  CBC Latest Ref Rng & Units 12/28/2019 10/06/2019 08/11/2019  WBC 4.0 - 10.5 K/uL 3.5(L) 3.3(L) 4.3  Hemoglobin 12.0 - 15.0 g/dL 9.7(L) 10.1(L) 10.9(L)  Hematocrit 36 - 46 % 31.2(L) 32.9(L) 35.7(L)  Platelets 150 - 400 K/uL 238 232 251   CMP Latest Ref Rng & Units 12/28/2019 10/06/2019 08/11/2019  Glucose 70 - 99 mg/dL 99 93 86  BUN 8 - 23 mg/dL 29(H) 37(H) 40(H)  Creatinine 0.44 - 1.00 mg/dL 1.38(H) 1.51(H) 1.19(H)  Sodium 135 - 145 mmol/L 138 137 139  Potassium 3.5 - 5.1 mmol/L 5.0 4.7 4.7  Chloride 98 - 111 mmol/L 104 104 102  CO2 22 - 32 mmol/L 26 24 28   Calcium 8.9 - 10.3 mg/dL 9.0 9.0 9.5  Total Protein 6.5 - 8.1 g/dL 7.4 7.4 7.7  Total Bilirubin 0.3 - 1.2 mg/dL 0.5 0.4 0.5  Alkaline Phos 38 - 126 U/L 98 116 109  AST 15 - 41 U/L 43(H) 40 27  ALT 0 - 44 U/L 32 34 19      Component Value Date/Time   RBC 3.23 (L) 12/28/2019 1435   MCV 96.6 12/28/2019 1435   MCH 30.0 12/28/2019 1435   MCHC 31.1 12/28/2019 1435   RDW 15.4 12/28/2019 1435   LYMPHSABS  1.9 12/28/2019 1435   MONOABS 0.4 12/28/2019 1435   EOSABS 0.0 12/28/2019 1435   BASOSABS 0.0 12/28/2019 1435   Lab Results  Component Value Date   VD25OH 38.25 12/28/2019   VD25OH 32.05 08/11/2019   VD25OH 20.76 (L) 07/21/2019   Lab Results  Component Value Date   TIBC 276 12/28/2019   TIBC 282 10/06/2019   TIBC 292 08/11/2019   FERRITIN 585 (H) 12/28/2019   FERRITIN 679 (H)  10/06/2019   FERRITIN 502 (H) 08/11/2019   IRONPCTSAT 13 12/28/2019   IRONPCTSAT 14 10/06/2019   IRONPCTSAT 18 08/11/2019    DIAGNOSTIC IMAGING:  I have independently reviewed the scans and discussed with the patient. No results found.   ASSESSMENT:  1. CML in chronic phase: -Diagnosed in March 2016, treated initially with Nilotinib from May 2016 through July 2018 secondary to recurrent strokes. -Dasatinib 100 mg daily started in February 2019. She had thoracentesis done in February 2020. -She again had thoracentesis done on 07/13/2018 on the right side with 650 mL of pink-tinged fluid. -BCR/ABL by quantitative PCR on 07/21/2019 was negative.  2. CKD: -Ultrasound of the abdomen on 06/18/2019 shows kidneys slightly small and echogenic as seen previously with no obstruction. Bilateral pleural effusions.  3. Bradycardia: -Mobitz type II AV block. Pacemaker placed on 05/06/2018.   PLAN:  1. CML in chronic phase: -She continues to tolerate dasatinib 50 mg daily very well. -Reviewed lab work from 12/28/2019 which showed white count 3.5.  LFTs show mildly elevated AST. -BCR/ABL by quantitative PCR was undetectable. -Continue current dose of dasatinib. -RTC 3 months with repeat labs.  2. CKD: -Creatinine has improved to 1.38.  We will continue to monitor closely.  3. Hypertension: -Continue losartan, hydralazine and Norvasc daily.  4. Constipation: -Continue MiraLAX as needed.  5. Normocytic anemia: -Last Feraheme on 06/04/2019.  Hemoglobin today is 9.7.  Ferritin is 585 and percent saturation is 13.  No parenteral iron therapy indicated.  6.  Weight loss: -She is eating well.  She is drinking about 4 cans of boost per day.  Weight is stable.  Orders placed this encounter:  No orders of the defined types were placed in this encounter.    Derek Jack, MD [place] [phone]   I, Milinda Antis, am acting as a scribe for Dr.  Sanda Linger.  I, Derek Jack MD, have reviewed the above documentation for accuracy and completeness, and I agree with the above.

## 2020-01-06 NOTE — Patient Instructions (Signed)
[facility] at [facility] Discharge Instructions  You were seen today by Dr. Katragadda. He went over your recent results. Dr. Katragadda will see you back in 3 months for labs and follow up.   Thank you for choosing [facility] at [facility] to provide your oncology and hematology care.  To afford each patient quality time with our provider, please arrive at least 15 minutes before your scheduled appointment time.   If you have a lab appointment with the Cancer Center please come in thru the Main Entrance and check in at the main information desk  You need to re-schedule your appointment should you arrive 10 or more minutes late.  We strive to give you quality time with our providers, and arriving late affects you and other patients whose appointments are after yours.  Also, if you no show three or more times for appointments you may be dismissed from the clinic at the providers discretion.     Again, thank you for choosing [facility].  Our hope is that these requests will decrease the amount of time that you wait before being seen by our physicians.       _____________________________________________________________  Should you have questions after your visit to [facility], please contact our office at [phone] between the hours of 8:00 a.m. and 4:30 p.m.  Voicemails left after 4:00 p.m. will not be returned until the following business day.  For prescription refill requests, have your pharmacy contact our office and allow 72 hours.    Cancer Center Support Programs:   > Cancer Support Group  2nd Tuesday of the month 1pm-2pm, Journey Room   

## 2020-01-20 MED FILL — SPRYCEL 50 MG TABLET: 50 | 30 days supply | Qty: 30 | Fill #1

## 2020-02-02 ENCOUNTER — Ambulatory Visit (INDEPENDENT_AMBULATORY_CARE_PROVIDER_SITE_OTHER): Payer: Medicare HMO

## 2020-02-02 DIAGNOSIS — I441 Atrioventricular block, second degree: Secondary | ICD-10-CM

## 2020-02-02 LAB — CUP PACEART REMOTE DEVICE CHECK
Battery Remaining Longevity: 133 mo
Battery Voltage: 3.02 V
Brady Statistic AP VP Percent: 0.32 %
Brady Statistic AP VS Percent: 0.06 %
Brady Statistic AS VP Percent: 76.27 %
Brady Statistic AS VS Percent: 23.35 %
Brady Statistic RA Percent Paced: 0.41 %
Brady Statistic RV Percent Paced: 76.59 %
Date Time Interrogation Session: 20211124035322
Implantable Lead Implant Date: 20200227
Implantable Lead Implant Date: 20200227
Implantable Lead Location: 753859
Implantable Lead Location: 753860
Implantable Lead Model: 5076
Implantable Lead Model: 5076
Implantable Pulse Generator Implant Date: 20200227
Lead Channel Impedance Value: 228 Ohm
Lead Channel Impedance Value: 304 Ohm
Lead Channel Impedance Value: 380 Ohm
Lead Channel Impedance Value: 418 Ohm
Lead Channel Pacing Threshold Amplitude: 0.5 V
Lead Channel Pacing Threshold Amplitude: 0.625 V
Lead Channel Pacing Threshold Pulse Width: 0.4 ms
Lead Channel Pacing Threshold Pulse Width: 0.4 ms
Lead Channel Sensing Intrinsic Amplitude: 0.5 mV
Lead Channel Sensing Intrinsic Amplitude: 0.5 mV
Lead Channel Sensing Intrinsic Amplitude: 6 mV
Lead Channel Sensing Intrinsic Amplitude: 6 mV
Lead Channel Setting Pacing Amplitude: 1.5 V
Lead Channel Setting Pacing Amplitude: 2 V
Lead Channel Setting Pacing Pulse Width: 0.4 ms
Lead Channel Setting Sensing Sensitivity: 2 mV

## 2020-02-10 NOTE — Progress Notes (Signed)
Remote pacemaker transmission.   

## 2020-02-24 DIAGNOSIS — I5032 Chronic diastolic (congestive) heart failure: Secondary | ICD-10-CM | POA: Diagnosis not present

## 2020-02-24 DIAGNOSIS — E211 Secondary hyperparathyroidism, not elsewhere classified: Secondary | ICD-10-CM | POA: Diagnosis not present

## 2020-02-24 DIAGNOSIS — D638 Anemia in other chronic diseases classified elsewhere: Secondary | ICD-10-CM | POA: Diagnosis not present

## 2020-02-24 DIAGNOSIS — N1831 Chronic kidney disease, stage 3a: Secondary | ICD-10-CM | POA: Diagnosis not present

## 2020-02-24 DIAGNOSIS — I129 Hypertensive chronic kidney disease with stage 1 through stage 4 chronic kidney disease, or unspecified chronic kidney disease: Secondary | ICD-10-CM | POA: Diagnosis not present

## 2020-02-25 DIAGNOSIS — E559 Vitamin D deficiency, unspecified: Secondary | ICD-10-CM | POA: Diagnosis not present

## 2020-02-25 DIAGNOSIS — I129 Hypertensive chronic kidney disease with stage 1 through stage 4 chronic kidney disease, or unspecified chronic kidney disease: Secondary | ICD-10-CM | POA: Diagnosis not present

## 2020-02-25 DIAGNOSIS — D638 Anemia in other chronic diseases classified elsewhere: Secondary | ICD-10-CM | POA: Diagnosis not present

## 2020-02-25 DIAGNOSIS — R809 Proteinuria, unspecified: Secondary | ICD-10-CM | POA: Diagnosis not present

## 2020-02-25 DIAGNOSIS — E211 Secondary hyperparathyroidism, not elsewhere classified: Secondary | ICD-10-CM | POA: Diagnosis not present

## 2020-02-25 DIAGNOSIS — N1832 Chronic kidney disease, stage 3b: Secondary | ICD-10-CM | POA: Diagnosis not present

## 2020-02-25 DIAGNOSIS — I5032 Chronic diastolic (congestive) heart failure: Secondary | ICD-10-CM | POA: Diagnosis not present

## 2020-02-25 MED FILL — SPRYCEL 50 MG TABLET: 50 | 30 days supply | Qty: 30 | Fill #2

## 2020-03-24 ENCOUNTER — Ambulatory Visit: Payer: Medicare HMO | Admitting: Podiatry

## 2020-03-24 ENCOUNTER — Telehealth (HOSPITAL_COMMUNITY): Payer: Self-pay | Admitting: Pharmacy Technician

## 2020-03-24 MED FILL — SPRYCEL 50 MG TABLET: 50 | 30 days supply | Qty: 30 | Fill #3

## 2020-03-27 NOTE — Telephone Encounter (Signed)
Spoke to [place] about potential assistance.  I am going to apply online for PSI for her mother and she gave me permission to use her email address for correspondence from PSI.  I also told her we can apply for BMS patient assistance also, in case funding closes while the PSI application is processing.  [place] Patient [place] Phone [phone] Fax [phone] 03/27/2020 3:59 PM

## 2020-03-27 NOTE — Telephone Encounter (Signed)
Oral Oncology Patient Advocate Encounter  Received an email from [place] that patients [place] had expired.    Tried calling patients daughter, Debroah Baller, on 1/14 and had to leave a voicemail.  Patient will need to apply for grant assistance through PSI or patient assistance through [employer].  [place] Patient [place] Phone [phone] Fax [phone] 03/27/2020 1:34 PM

## 2020-03-28 ENCOUNTER — Telehealth (HOSPITAL_COMMUNITY): Payer: Self-pay | Admitting: Pharmacy Technician

## 2020-03-28 NOTE — Telephone Encounter (Signed)
Oral Oncology Patient Advocate Encounter   Submitted an Fish farm manager for Sprycel copay assistance through Patient [place] (PSI).    I spoke with Debroah Baller, patients daughter, to inform her that PSI would reach out to them by phone, email, or mail to request financial documents and diagnosis verification.  PSI will not release any information to me or the office about the amount of his grant or the approval dates.  They will release billing information only.   Debroah Baller is aware and will update me about the application status.  [place] Patient [place] Phone [phone] Fax [phone] 03/28/2020 11:25 AM

## 2020-03-31 ENCOUNTER — Ambulatory Visit: Payer: Medicare HMO | Admitting: Podiatry

## 2020-04-05 ENCOUNTER — Inpatient Hospital Stay (HOSPITAL_COMMUNITY): Payer: Medicare HMO | Attending: Hematology

## 2020-04-07 NOTE — Telephone Encounter (Signed)
Spoke with Debroah Baller on 1/25 because I had not received the BMS application or any response about the PSI grant.  Debroah Baller said the portal said that funding had closed.  Will pursue assistance through the manufacturer.

## 2020-04-07 NOTE — Telephone Encounter (Signed)
Oral Oncology Patient Advocate Encounter  Received application for BMS patient assistance from daughter Debroah Baller on 04/05/20.  Patient is applying to BMS to reduce out of pocket expense for Sprycel to $0.    Application completed and faxed to [phone].   BMS Access Support phone number for follow up is [phone].   This encounter will be updated until final determination.   [place] Patient [place] Phone [phone] Fax [phone] 04/07/2020 9:55 AM

## 2020-04-11 ENCOUNTER — Other Ambulatory Visit (HOSPITAL_COMMUNITY): Payer: Self-pay

## 2020-04-11 DIAGNOSIS — C9210 Chronic myeloid leukemia, BCR/ABL-positive, not having achieved remission: Secondary | ICD-10-CM

## 2020-04-11 MED ORDER — DASATINIB 50 MG PO TABS: ORAL_TABLET | ORAL | 3 refills | Status: DC

## 2020-04-11 NOTE — Telephone Encounter (Signed)
Oral Oncology Patient Advocate Encounter  Received notification from [place] Patient [facility] that patient has been successfully enrolled into their program to receive Sprycel from the manufacturer at $0 out of pocket until 03/10/21.    I will call and inform patients daughter, Debroah Baller, of the approval.  Specialty Pharmacy that will dispense medication is [facility].  Patient knows to call the office with questions or concerns.   Oral Oncology Clinic will continue to follow.  [place] Patient [place] Phone [phone] Fax [phone] 04/11/2020 8:21 AM

## 2020-04-12 ENCOUNTER — Other Ambulatory Visit (HOSPITAL_COMMUNITY): Payer: Medicare HMO

## 2020-04-19 ENCOUNTER — Ambulatory Visit (HOSPITAL_COMMUNITY): Payer: Medicare HMO | Admitting: Hematology

## 2020-04-19 ENCOUNTER — Inpatient Hospital Stay (HOSPITAL_COMMUNITY): Payer: Medicare HMO | Attending: Hematology

## 2020-04-19 ENCOUNTER — Other Ambulatory Visit: Payer: Self-pay

## 2020-04-19 DIAGNOSIS — I129 Hypertensive chronic kidney disease with stage 1 through stage 4 chronic kidney disease, or unspecified chronic kidney disease: Secondary | ICD-10-CM | POA: Insufficient documentation

## 2020-04-19 DIAGNOSIS — N189 Chronic kidney disease, unspecified: Secondary | ICD-10-CM | POA: Diagnosis not present

## 2020-04-19 DIAGNOSIS — Z950 Presence of cardiac pacemaker: Secondary | ICD-10-CM | POA: Diagnosis not present

## 2020-04-19 DIAGNOSIS — R001 Bradycardia, unspecified: Secondary | ICD-10-CM | POA: Diagnosis not present

## 2020-04-19 DIAGNOSIS — Z79899 Other long term (current) drug therapy: Secondary | ICD-10-CM | POA: Insufficient documentation

## 2020-04-19 DIAGNOSIS — C9210 Chronic myeloid leukemia, BCR/ABL-positive, not having achieved remission: Secondary | ICD-10-CM | POA: Insufficient documentation

## 2020-04-19 DIAGNOSIS — K5900 Constipation, unspecified: Secondary | ICD-10-CM | POA: Insufficient documentation

## 2020-04-19 DIAGNOSIS — J90 Pleural effusion, not elsewhere classified: Secondary | ICD-10-CM | POA: Insufficient documentation

## 2020-04-19 DIAGNOSIS — R634 Abnormal weight loss: Secondary | ICD-10-CM | POA: Insufficient documentation

## 2020-04-19 LAB — COMPREHENSIVE METABOLIC PANEL
ALT: 19 U/L (ref 0–44)
AST: 29 U/L (ref 15–41)
Albumin: 4.1 g/dL (ref 3.5–5.0)
Alkaline Phosphatase: 102 U/L (ref 38–126)
Anion gap: 9 (ref 5–15)
BUN: 58 mg/dL — ABNORMAL HIGH (ref 8–23)
CO2: 25 mmol/L (ref 22–32)
Calcium: 9.8 mg/dL (ref 8.9–10.3)
Chloride: 103 mmol/L (ref 98–111)
Creatinine, Ser: 1.61 mg/dL — ABNORMAL HIGH (ref 0.44–1.00)
GFR, Estimated: 33 mL/min — ABNORMAL LOW (ref >60)
Glucose, Bld: 131 mg/dL — ABNORMAL HIGH (ref 70–99)
Potassium: 4.8 mmol/L (ref 3.5–5.1)
Sodium: 137 mmol/L (ref 135–145)
Total Bilirubin: 0.7 mg/dL (ref 0.3–1.2)
Total Protein: 8.5 g/dL — ABNORMAL HIGH (ref 6.5–8.1)

## 2020-04-19 LAB — CBC WITH DIFFERENTIAL/PLATELET
Abs Immature Granulocytes: 0.01 10*3/uL (ref 0.00–0.07)
Basophils Absolute: 0 10*3/uL (ref 0.0–0.1)
Basophils Relative: 0 %
Eosinophils Absolute: 0 10*3/uL (ref 0.0–0.5)
Eosinophils Relative: 0 %
HCT: 34.6 % — ABNORMAL LOW (ref 36.0–46.0)
Hemoglobin: 10.6 g/dL — ABNORMAL LOW (ref 12.0–15.0)
Immature Granulocytes: 0 %
Lymphocytes Relative: 24 %
Lymphs Abs: 1.2 10*3/uL (ref 0.7–4.0)
MCH: 29.9 pg (ref 26.0–34.0)
MCHC: 30.6 g/dL (ref 30.0–36.0)
MCV: 97.7 fL (ref 80.0–100.0)
Monocytes Absolute: 0.2 10*3/uL (ref 0.1–1.0)
Monocytes Relative: 4 %
Neutro Abs: 3.8 10*3/uL (ref 1.7–7.7)
Neutrophils Relative %: 72 %
Platelets: 277 10*3/uL (ref 150–400)
RBC: 3.54 MIL/uL — ABNORMAL LOW (ref 3.87–5.11)
RDW: 15.1 % (ref 11.5–15.5)
WBC: 5.3 10*3/uL (ref 4.0–10.5)
nRBC: 0 % (ref 0.0–0.2)

## 2020-04-19 LAB — FERRITIN: Ferritin: 294 ng/mL (ref 11–307)

## 2020-04-19 LAB — IRON AND TIBC
Iron: 38 ug/dL (ref 28–170)
Saturation Ratios: 12 % (ref 10.4–31.8)
TIBC: 305 ug/dL (ref 250–450)
UIBC: 267 ug/dL

## 2020-04-23 LAB — BCR-ABL1, CML/ALL, PCR, QUANT: Interpretation (BCRAL):: NEGATIVE

## 2020-04-26 ENCOUNTER — Inpatient Hospital Stay (HOSPITAL_BASED_OUTPATIENT_CLINIC_OR_DEPARTMENT_OTHER): Payer: Medicare HMO | Admitting: Hematology

## 2020-04-26 ENCOUNTER — Other Ambulatory Visit: Payer: Self-pay

## 2020-04-26 VITALS — BP 108/57 | HR 82 | Temp 96.8°F | Resp 16

## 2020-04-26 DIAGNOSIS — Z79899 Other long term (current) drug therapy: Secondary | ICD-10-CM | POA: Diagnosis not present

## 2020-04-26 DIAGNOSIS — I129 Hypertensive chronic kidney disease with stage 1 through stage 4 chronic kidney disease, or unspecified chronic kidney disease: Secondary | ICD-10-CM | POA: Diagnosis not present

## 2020-04-26 DIAGNOSIS — K5900 Constipation, unspecified: Secondary | ICD-10-CM | POA: Diagnosis not present

## 2020-04-26 DIAGNOSIS — C9211 Chronic myeloid leukemia, BCR/ABL-positive, in remission: Secondary | ICD-10-CM

## 2020-04-26 DIAGNOSIS — J90 Pleural effusion, not elsewhere classified: Secondary | ICD-10-CM | POA: Diagnosis not present

## 2020-04-26 DIAGNOSIS — N189 Chronic kidney disease, unspecified: Secondary | ICD-10-CM | POA: Diagnosis not present

## 2020-04-26 DIAGNOSIS — Z950 Presence of cardiac pacemaker: Secondary | ICD-10-CM | POA: Diagnosis not present

## 2020-04-26 DIAGNOSIS — R634 Abnormal weight loss: Secondary | ICD-10-CM | POA: Diagnosis not present

## 2020-04-26 DIAGNOSIS — C9210 Chronic myeloid leukemia, BCR/ABL-positive, not having achieved remission: Secondary | ICD-10-CM | POA: Diagnosis not present

## 2020-04-26 DIAGNOSIS — R001 Bradycardia, unspecified: Secondary | ICD-10-CM | POA: Diagnosis not present

## 2020-04-26 NOTE — Patient Instructions (Signed)
[place] at [facility] Discharge Instructions  You were seen today by Dr. Delton Coombes. He went over your recent results. Start drinking 4 cans of Boost and Ensure daily to improve your weight and energy levels. Dr. Delton Coombes will see you back in 3 months for labs and follow up.   Thank you for choosing [place] at [facility] to provide your oncology and hematology care.  To afford each patient quality time with our provider, please arrive at least 15 minutes before your scheduled appointment time.   If you have a lab appointment with the [facility] please come in thru the Main Entrance and check in at the main information desk  You need to re-schedule your appointment should you arrive 10 or more minutes late.  We strive to give you quality time with our providers, and arriving late affects you and other patients whose appointments are after yours.  Also, if you no show three or more times for appointments you may be dismissed from the clinic at the providers discretion.     Again, thank you for choosing [facility].  Our hope is that these requests will decrease the amount of time that you wait before being seen by our physicians.       _____________________________________________________________  Should you have questions after your visit to [facility], please contact our office at [phone] between the hours of 8:00 a.m. and 4:30 p.m.  Voicemails left after 4:00 p.m. will not be returned until the following business day.  For prescription refill requests, have your pharmacy contact our office and allow 72 hours.    Cancer Center Support Programs:   > Cancer Support Group  2nd Tuesday of the month 1pm-2pm, Journey Room

## 2020-04-26 NOTE — Addendum Note (Signed)
Addended by: Candis Musa on: 04/26/2020 02:16 PM   Modules accepted: Orders

## 2020-04-26 NOTE — Progress Notes (Signed)
April Page, [place] [address]   CLINIC:  Medical Oncology/Hematology  PCP:  [facility], [place] / [place] [address]  [phone]  REASON FOR VISIT:  Follow-up for CML  PRIOR THERAPY: Nilotinib from 07/2014 to 09/2016  CURRENT THERAPY: Dasatinib 50 mg dailyi  INTERVAL HISTORY:  April Page, a 75 y.o. female, returns for routine follow-up for her CML. Delcia was last seen on 01/06/2020.  Today she is accompanied by her granddaughter and she reports feeling fair. She denies having any recent infections, F/C or night sweats. She is taking dasatinib and tolerating it well. She is taking Miralax and a stool softener for her constipation which is helping. She has cut back to drinking 3 cans of Boost and Ensure but has not been eating more. She denies having SOB.   REVIEW OF SYSTEMS:  Review of Systems  Constitutional: Positive for appetite change (25%) and fatigue (25%). Negative for chills, diaphoresis and fever.  Respiratory: Negative for shortness of breath.   Cardiovascular: Positive for leg swelling (L ankle swelling).  Gastrointestinal: Positive for constipation (on Miralax & stool softener) and diarrhea.  All other systems reviewed and are negative.   PAST MEDICAL/SURGICAL HISTORY:  Past Medical History:  Diagnosis Date  . Arthritis    stiff knees  . Blood dyscrasia   . Cancer ([place])   . CVA (cerebral vascular accident) ([place])   . GERD (gastroesophageal reflux disease)   . H/O: CML (chronic myeloid leukemia)   . Hyperlipidemia   . Hyperlipidemia   . Hypertension   . Iron deficiency anemia 07/15/2018  . Obesity   . Pessary maintenance 07/01/2013  . Stroke ([place])    x 3   Past Surgical History:  Procedure Laterality Date  . ABDOMINAL HYSTERECTOMY    . BIOPSY  08/20/2018   Procedure: BIOPSY;  Surgeon: [name], MD;  [facility];  Service: Endoscopy;;  gastric  . CHOLECYSTECTOMY    .  COLONOSCOPY  06/09/2002   GNF:AOZHYQMV hemorrhoids; otherwise normal rectum, colon   . COLONOSCOPY N/A 08/25/2013   HQI:ONGEXBM diverticulosis. Single colonic polyp-removed  s/p segmental biopsy and stool sample. random colon bx negative. +benign leiomyoma.  . cystocele/rectocele repair  2009  . ESOPHAGOGASTRODUODENOSCOPY  06/09/2002   WUX:LKGMWN upper gastrointestinal tract s/p  54-French Maloney dilator  . ESOPHAGOGASTRODUODENOSCOPY (EGD) WITH PROPOFOL N/A 08/20/2018   Dr. Gala Romney: erosive reflux esophagitis, gastritis but no h.pylori, hiatal hernia, s/p esophageal dilation  . FEMUR IM NAIL Right 04/12/2016   Procedure: INTRAMEDULLARY (IM) NAIL FEMORAL;  Surgeon: Nicholes Stairs, MD;  Location: [facility];  Service: Orthopedics;  Laterality: Right;  . HERNIA REPAIR    . IR THORACENTESIS ASP PLEURAL SPACE W/IMG GUIDE  05/07/2018  . IR THORACENTESIS ASP PLEURAL SPACE W/IMG GUIDE  07/13/2019  . LOOP RECORDER INSERTION N/A 08/23/2016   Procedure: Loop Recorder Insertion;  Surgeon: Constance Haw, MD;  Location: [facility];  Service: Cardiovascular;  Laterality: N/A;  . LOOP RECORDER REMOVAL N/A 05/07/2018   Procedure: LOOP RECORDER REMOVAL;  Surgeon: Constance Haw, MD;  Location: [facility];  Service: Cardiovascular;  Laterality: N/A;  . Venia Minks DILATION N/A 08/20/2018   Procedure: Venia Minks DILATION;  Surgeon: [name], MD;  [facility];  Service: Endoscopy;  Laterality: N/A;  . OOPHORECTOMY    . PACEMAKER IMPLANT N/A 05/07/2018   Procedure: PACEMAKER IMPLANT;  Surgeon: Constance Haw, MD;  Location: [facility];  Service: Cardiovascular;  Laterality: N/A;  . TEE WITHOUT CARDIOVERSION N/A 08/23/2016   Procedure: TRANSESOPHAGEAL ECHOCARDIOGRAM (TEE);  Surgeon: Fay Records, MD;  Location: [facility];  Service: Cardiovascular;  Laterality: N/A;    SOCIAL HISTORY:  Social History   Socioeconomic History  . Marital status: Widowed    Spouse  name: Not on file  . Number of children: 6  . Years of education: associate  . Highest education level: Not on file  Occupational History    Employer: RETIRED  Tobacco Use  . Smoking status: Never Smoker  . Smokeless tobacco: Never Used  Vaping Use  . Vaping Use: Never used  Substance and Sexual Activity  . Alcohol use: No  . Drug use: No  . Sexual activity: Not Currently    Birth control/protection: Surgical    Comment: hyst  Other Topics Concern  . Not on file  Social History Narrative  . Not on file   Social Determinants of Health   Financial Resource Strain: Not on file  Food Insecurity: Not on file  Transportation Needs: Not on file  Physical Activity: Not on file  Stress: Not on file  Social Connections: Not on file  Intimate Partner Violence: Not on file    FAMILY HISTORY:  Family History  Problem Relation Age of Onset  . Anuerysm Mother        deceased age 42, brain anuerysm  . Early death Mother 68  . Heart disease Father   . Hypertension Sister   . Obesity Brother   . Hypertension Sister   . Arthritis Sister   . Other Son        cardiac arrest  . Heart disease Child 48       cardiac arrest  . Colon cancer Neg Hx     CURRENT MEDICATIONS:  Current Outpatient Medications  Medication Sig Dispense Refill  . amLODipine (NORVASC) 10 MG tablet Take 1 tablet (10 mg total) by mouth daily. 30 tablet 0  . aspirin 325 MG tablet Take 325 mg by mouth daily.    Marland Kitchen atorvastatin (LIPITOR) 40 MG tablet Take 1 tablet (40 mg total) by mouth daily at 6 PM. 30 tablet 0  . dasatinib (SPRYCEL) 50 MG tablet TAKE 1 TABLET (50 MG TOTAL) BY MOUTH DAILY. 30 tablet 3  . feeding supplement, ENSURE ENLIVE, (ENSURE ENLIVE) LIQD Take 237 mLs by mouth 2 (two) times daily between meals. 237 mL 12  . furosemide (LASIX) 20 MG tablet Take 20 mg by mouth daily.     . hydrALAZINE (APRESOLINE) 25 MG tablet Take 25 mg by mouth 3 (three) times daily.     Marland Kitchen losartan (COZAAR) 100 MG tablet  Take 1 tablet (100 mg total) by mouth daily. Resume on 3/28 (Patient taking differently: Take 100 mg by mouth daily.) 30 tablet 0  . Multiple Vitamin (MULTIVITAMIN WITH MINERALS) TABS tablet Take 1 tablet by mouth daily.    . polyethylene glycol (MIRALAX / GLYCOLAX) packet Take 17 g by mouth daily. 14 each 0  . sennosides-docusate sodium (SENOKOT-S) 8.6-50 MG tablet Take 1 tablet by mouth every evening.    Marland Kitchen acetaminophen (TYLENOL) 325 MG tablet Take 2 tablets (650 mg total) by mouth every 6 (six) hours as needed for mild pain (or temp > 37.5 C (99.5 F)). (Patient not taking: Reported on 04/26/2020)     No current facility-administered medications for this visit.    ALLERGIES:  No Known Allergies  PHYSICAL EXAM:  Performance status (ECOG): 2 - Symptomatic, <50% confined to bed  Vitals:   04/26/20 1130  BP: (!) 108/57  Pulse: 82  Resp: 16  Temp: (!) 96.8 F (36 C)  SpO2: 100%   Wt Readings from Last 3 Encounters:  10/13/19 113 lb 9.6 oz (51.5 kg)  07/28/19 117 lb 4 oz (53.2 kg)  01/22/19 123 lb (55.8 kg)   Physical Exam Vitals reviewed.  Constitutional:      Appearance: Normal appearance.     Comments: In wheelchair  Cardiovascular:     Rate and Rhythm: Normal rate and regular rhythm.     Pulses: Normal pulses.     Heart sounds: Normal heart sounds.  Pulmonary:     Effort: Pulmonary effort is normal.     Breath sounds: Normal breath sounds.  Musculoskeletal:     Right lower leg: No edema.     Left lower leg: No edema.  Neurological:     General: No focal deficit present.     Mental Status: She is alert and oriented to person, place, and time.  Psychiatric:        Mood and Affect: Mood normal.        Behavior: Behavior normal.     LABORATORY DATA:  I have reviewed the labs as listed.  CBC Latest Ref Rng & Units 04/19/2020 12/28/2019 10/06/2019  WBC 4.0 - 10.5 K/uL 5.3 3.5(L) 3.3(L)  Hemoglobin 12.0 - 15.0 g/dL 10.6(L) 9.7(L) 10.1(L)  Hematocrit 36.0 - 46.0 %  34.6(L) 31.2(L) 32.9(L)  Platelets 150 - 400 K/uL 277 238 232   CMP Latest Ref Rng & Units 04/19/2020 12/28/2019 10/06/2019  Glucose 70 - 99 mg/dL 131(H) 99 93  BUN 8 - 23 mg/dL 58(H) 29(H) 37(H)  Creatinine 0.44 - 1.00 mg/dL 1.61(H) 1.38(H) 1.51(H)  Sodium 135 - 145 mmol/L 137 138 137  Potassium 3.5 - 5.1 mmol/L 4.8 5.0 4.7  Chloride 98 - 111 mmol/L 103 104 104  CO2 22 - 32 mmol/L 25 26 24   Calcium 8.9 - 10.3 mg/dL 9.8 9.0 9.0  Total Protein 6.5 - 8.1 g/dL 8.5(H) 7.4 7.4  Total Bilirubin 0.3 - 1.2 mg/dL 0.7 0.5 0.4  Alkaline Phos 38 - 126 U/L 102 98 116  AST 15 - 41 U/L 29 43(H) 40  ALT 0 - 44 U/L 19 32 34      Component Value Date/Time   RBC 3.54 (L) 04/19/2020 0806   MCV 97.7 04/19/2020 0806   MCH 29.9 04/19/2020 0806   MCHC 30.6 04/19/2020 0806   RDW 15.1 04/19/2020 0806   LYMPHSABS 1.2 04/19/2020 0806   MONOABS 0.2 04/19/2020 0806   EOSABS 0.0 04/19/2020 0806   BASOSABS 0.0 04/19/2020 0806   Lab Results  Component Value Date   TIBC 305 04/19/2020   TIBC 276 12/28/2019   TIBC 282 10/06/2019   FERRITIN 294 04/19/2020   FERRITIN 585 (H) 12/28/2019   FERRITIN 679 (H) 10/06/2019   IRONPCTSAT 12 04/19/2020   IRONPCTSAT 13 12/28/2019   IRONPCTSAT 14 10/06/2019    DIAGNOSTIC IMAGING:  I have independently reviewed the scans and discussed with the patient. No results found.   ASSESSMENT:  1. CML in chronic phase: -Diagnosed in March 2016, treated initially with Nilotinib from May 2016 through July 2018 secondary to recurrent strokes. -Dasatinib 100 mg daily started in February 2019. She had thoracentesis done in February 2020. -She again had thoracentesis done on 07/13/2018 on the right side with 650 mL of  pink-tinged fluid. -BCR/ABL by quantitative PCR on 07/21/2019 was negative.  2. CKD: -Ultrasound of the abdomen on 06/18/2019 shows kidneys slightly small and echogenic as seen previously with no obstruction. Bilateral pleural effusions.  3. Bradycardia: -Mobitz  type II AV block. Pacemaker placed on 05/06/2018.   PLAN:  1. CML in chronic phase: -She is tolerating dasatinib 50 mg daily very well. -I have reviewed her labs from 04/19/2020.  White count is normal with normal differential.  Platelets are normal. -BCR/ABL by quantitative PCR was negative. -She continues to be in remission at this time.  Continue same dose of dasatinib. -RTC 3 months for follow-up with repeat BCR/ABL by quantitative PCR.  2. CKD: -Creatinine is 1.61.  Continue to monitor closely.  3. Hypertension: -Continue losartan, hydralazine and Norvasc daily.  4. Constipation: -Continue MiraLAX as needed.  5. Normocytic anemia: -She has received Feraheme in the past.  Ferritin is 294 and percent saturation is 12.  Hemoglobin 10.6.  No parenteral iron therapy needed.  6. Weight loss: -She has cut back on 3 cans of boost per day.  It did not improve her solid food intake.  We will increase her back to 4 cans of boost daily.  Orders placed this encounter:  No orders of the defined types were placed in this encounter.    Derek Jack, MD [place] [phone]   I, Milinda Antis, am acting as a scribe for Dr. Sanda Linger.  I, Derek Jack MD, have reviewed the above documentation for accuracy and completeness, and I agree with the above.

## 2020-05-03 ENCOUNTER — Ambulatory Visit (INDEPENDENT_AMBULATORY_CARE_PROVIDER_SITE_OTHER): Payer: Medicare HMO

## 2020-05-03 DIAGNOSIS — I441 Atrioventricular block, second degree: Secondary | ICD-10-CM

## 2020-05-03 DIAGNOSIS — I639 Cerebral infarction, unspecified: Secondary | ICD-10-CM

## 2020-05-04 LAB — CUP PACEART REMOTE DEVICE CHECK
Battery Remaining Longevity: 130 mo
Battery Voltage: 3.01 V
Brady Statistic AP VP Percent: 3.26 %
Brady Statistic AP VS Percent: 0.21 %
Brady Statistic AS VP Percent: 92.19 %
Brady Statistic AS VS Percent: 4.34 %
Brady Statistic RA Percent Paced: 3.45 %
Brady Statistic RV Percent Paced: 95.45 %
Date Time Interrogation Session: 20220222212559
Implantable Lead Implant Date: 20200227
Implantable Lead Implant Date: 20200227
Implantable Lead Location: 753859
Implantable Lead Location: 753860
Implantable Lead Model: 5076
Implantable Lead Model: 5076
Implantable Pulse Generator Implant Date: 20200227
Lead Channel Impedance Value: 247 Ohm
Lead Channel Impedance Value: 304 Ohm
Lead Channel Impedance Value: 380 Ohm
Lead Channel Impedance Value: 418 Ohm
Lead Channel Pacing Threshold Amplitude: 0.625 V
Lead Channel Pacing Threshold Amplitude: 0.75 V
Lead Channel Pacing Threshold Pulse Width: 0.4 ms
Lead Channel Pacing Threshold Pulse Width: 0.4 ms
Lead Channel Sensing Intrinsic Amplitude: 0.75 mV
Lead Channel Sensing Intrinsic Amplitude: 0.75 mV
Lead Channel Sensing Intrinsic Amplitude: 6.5 mV
Lead Channel Sensing Intrinsic Amplitude: 6.5 mV
Lead Channel Setting Pacing Amplitude: 1.5 V
Lead Channel Setting Pacing Amplitude: 2 V
Lead Channel Setting Pacing Pulse Width: 0.4 ms
Lead Channel Setting Sensing Sensitivity: 2 mV

## 2020-05-10 ENCOUNTER — Ambulatory Visit: Payer: Medicare HMO | Admitting: Podiatry

## 2020-05-11 NOTE — Progress Notes (Signed)
Remote pacemaker transmission.   

## 2020-06-06 ENCOUNTER — Other Ambulatory Visit (HOSPITAL_COMMUNITY): Payer: Self-pay

## 2020-06-09 ENCOUNTER — Other Ambulatory Visit: Payer: Self-pay

## 2020-06-09 ENCOUNTER — Encounter: Payer: Self-pay | Admitting: Podiatry

## 2020-06-09 ENCOUNTER — Ambulatory Visit: Payer: Medicare HMO | Admitting: Podiatry

## 2020-06-09 DIAGNOSIS — E118 Type 2 diabetes mellitus with unspecified complications: Secondary | ICD-10-CM

## 2020-06-09 DIAGNOSIS — B351 Tinea unguium: Secondary | ICD-10-CM

## 2020-06-09 DIAGNOSIS — M79674 Pain in right toe(s): Secondary | ICD-10-CM | POA: Diagnosis not present

## 2020-06-09 DIAGNOSIS — M79675 Pain in left toe(s): Secondary | ICD-10-CM | POA: Diagnosis not present

## 2020-06-09 NOTE — Progress Notes (Signed)
This patient returns to my office for at risk foot care.  This patient requires this care by a professional since this patient will be at risk due to having diabetes type 2 and CVA.   This patient is unable to cut nails herself since the patient cannot reach her nails.These nails are painful walking and wearing shoes.  This patient presents for at risk foot care today. She presents to the office with her daughter.    General Appearance  Alert, conversant and in no acute stress.  Vascular  Dorsalis pedis and posterior tibial  pulses are weakly  palpable  bilaterally.  Capillary return is within normal limits  bilaterally. Temperature is within normal limits  bilaterally.  Neurologic  Senn-Weinstein monofilament wire test diminished   bilaterally. Muscle power within normal limits bilaterally.  Nails Thick disfigured discolored nails with subungual debris  from hallux to fifth toes bilaterally. No evidence of bacterial infection or drainage bilaterally.  Orthopedic  No limitations of motion  feet .  No crepitus or effusions noted.  No bony pathology or digital deformities noted.  HAV  B/L.  Hammer toes 2-5  B/L.  Skin  normotropic skin with no porokeratosis noted bilaterally.  No signs of infections or ulcers noted.  Callus noted on the lateral heel right foot.  Onychomycosis  Pain in right toes  Pain in left toes  Callus right foot  Consent was obtained for treatment procedures.   Mechanical debridement of nails 1-5  bilaterally performed with a nail nipper.  Filed with dremel without incident. Debrided callus with # 15 blade and dremel tool. Discussed using pillow at bedtime.   Return office visit   3 months                  Told patient to return for periodic foot care and evaluation due to potential at risk complications.   Gardiner Barefoot DPM

## 2020-06-28 ENCOUNTER — Emergency Department (HOSPITAL_COMMUNITY): Payer: Medicare HMO

## 2020-06-28 ENCOUNTER — Encounter (HOSPITAL_COMMUNITY): Payer: Self-pay | Admitting: Emergency Medicine

## 2020-06-28 ENCOUNTER — Emergency Department (HOSPITAL_COMMUNITY)
Admission: EM | Admit: 2020-06-28 | Discharge: 2020-06-29 | Disposition: A | Discharge status: Home / Self Care | Payer: Medicare HMO | Attending: Emergency Medicine | Admitting: Emergency Medicine

## 2020-06-28 DIAGNOSIS — Z79899 Other long term (current) drug therapy: Secondary | ICD-10-CM | POA: Insufficient documentation

## 2020-06-28 DIAGNOSIS — I6529 Occlusion and stenosis of unspecified carotid artery: Secondary | ICD-10-CM | POA: Diagnosis not present

## 2020-06-28 DIAGNOSIS — R5383 Other fatigue: Secondary | ICD-10-CM | POA: Diagnosis not present

## 2020-06-28 DIAGNOSIS — Z20822 Contact with and (suspected) exposure to covid-19: Secondary | ICD-10-CM | POA: Diagnosis not present

## 2020-06-28 DIAGNOSIS — G319 Degenerative disease of nervous system, unspecified: Secondary | ICD-10-CM | POA: Diagnosis not present

## 2020-06-28 DIAGNOSIS — I10 Essential (primary) hypertension: Secondary | ICD-10-CM | POA: Insufficient documentation

## 2020-06-28 DIAGNOSIS — R531 Weakness: Secondary | ICD-10-CM | POA: Diagnosis not present

## 2020-06-28 DIAGNOSIS — R296 Repeated falls: Secondary | ICD-10-CM | POA: Insufficient documentation

## 2020-06-28 DIAGNOSIS — Z7982 Long term (current) use of aspirin: Secondary | ICD-10-CM | POA: Insufficient documentation

## 2020-06-28 DIAGNOSIS — R059 Cough, unspecified: Secondary | ICD-10-CM | POA: Diagnosis not present

## 2020-06-28 DIAGNOSIS — E119 Type 2 diabetes mellitus without complications: Secondary | ICD-10-CM | POA: Insufficient documentation

## 2020-06-28 DIAGNOSIS — Z859 Personal history of malignant neoplasm, unspecified: Secondary | ICD-10-CM | POA: Insufficient documentation

## 2020-06-28 DIAGNOSIS — Z950 Presence of cardiac pacemaker: Secondary | ICD-10-CM | POA: Diagnosis not present

## 2020-06-28 DIAGNOSIS — I6782 Cerebral ischemia: Secondary | ICD-10-CM | POA: Diagnosis not present

## 2020-06-28 DIAGNOSIS — J9811 Atelectasis: Secondary | ICD-10-CM | POA: Diagnosis not present

## 2020-06-28 DIAGNOSIS — J90 Pleural effusion, not elsewhere classified: Secondary | ICD-10-CM | POA: Diagnosis not present

## 2020-06-28 LAB — COMPREHENSIVE METABOLIC PANEL
ALT: 30 U/L (ref 0–44)
AST: 31 U/L (ref 15–41)
Albumin: 3.5 g/dL (ref 3.5–5.0)
Alkaline Phosphatase: 82 U/L (ref 38–126)
Anion gap: 9 (ref 5–15)
BUN: 48 mg/dL — ABNORMAL HIGH (ref 8–23)
CO2: 25 mmol/L (ref 22–32)
Calcium: 9.3 mg/dL (ref 8.9–10.3)
Chloride: 103 mmol/L (ref 98–111)
Creatinine, Ser: 1.55 mg/dL — ABNORMAL HIGH (ref 0.44–1.00)
GFR, Estimated: 35 mL/min — ABNORMAL LOW (ref >60)
Glucose, Bld: 106 mg/dL — ABNORMAL HIGH (ref 70–99)
Potassium: 5 mmol/L (ref 3.5–5.1)
Sodium: 137 mmol/L (ref 135–145)
Total Bilirubin: 0.4 mg/dL (ref 0.3–1.2)
Total Protein: 7.2 g/dL (ref 6.5–8.1)

## 2020-06-28 LAB — CBC WITH DIFFERENTIAL/PLATELET
Abs Immature Granulocytes: 0.01 10*3/uL (ref 0.00–0.07)
Basophils Absolute: 0 10*3/uL (ref 0.0–0.1)
Basophils Relative: 0 %
Eosinophils Absolute: 0 10*3/uL (ref 0.0–0.5)
Eosinophils Relative: 0 %
HCT: 32.5 % — ABNORMAL LOW (ref 36.0–46.0)
Hemoglobin: 10.2 g/dL — ABNORMAL LOW (ref 12.0–15.0)
Immature Granulocytes: 0 %
Lymphocytes Relative: 38 %
Lymphs Abs: 1.4 10*3/uL (ref 0.7–4.0)
MCH: 29.7 pg (ref 26.0–34.0)
MCHC: 31.4 g/dL (ref 30.0–36.0)
MCV: 94.8 fL (ref 80.0–100.0)
Monocytes Absolute: 0.5 10*3/uL (ref 0.1–1.0)
Monocytes Relative: 12 %
Neutro Abs: 1.8 10*3/uL (ref 1.7–7.7)
Neutrophils Relative %: 50 %
Platelets: 237 10*3/uL (ref 150–400)
RBC: 3.43 MIL/uL — ABNORMAL LOW (ref 3.87–5.11)
RDW: 14.5 % (ref 11.5–15.5)
WBC: 3.7 10*3/uL — ABNORMAL LOW (ref 4.0–10.5)
nRBC: 0 % (ref 0.0–0.2)

## 2020-06-28 LAB — RESP PANEL BY RT-PCR (FLU A&B, COVID) ARPGX2
Influenza A by PCR: NEGATIVE
Influenza B by PCR: NEGATIVE
SARS Coronavirus 2 by RT PCR: NEGATIVE

## 2020-06-28 LAB — AMMONIA: Ammonia: 21 umol/L (ref 9–35)

## 2020-06-28 LAB — TSH: TSH: 1.128 u[IU]/mL (ref 0.350–4.500)

## 2020-06-28 LAB — BRAIN NATRIURETIC PEPTIDE: B Natriuretic Peptide: 40.6 pg/mL (ref 0.0–100.0)

## 2020-06-28 LAB — LACTIC ACID, PLASMA: Lactic Acid, Venous: 0.8 mmol/L (ref 0.5–1.9)

## 2020-06-28 NOTE — ED Triage Notes (Signed)
Emergency Medicine Provider Triage Evaluation Note  April Page , a 75 y.o. female  was evaluated in triage.  Pt complains of confusion and weakness.  Last seen normal at 7 AM, but patient's daughter does report that over the past few weeks she has been weaker than usual and falling more often.  She came over to check on her this afternoon and noted that she seemed to be weaker than usual, was laying slumped over to her right side, had trouble getting herself to the table and usually walks with a walker.  Was having to use her arms to hold her body up.  Has prior history of stroke and she was worried she could be having a stroke again.  Review of Systems  Positive: Weakness Negative: Chest pain, fever, shortness of breath, vomiting  Physical Exam  BP 110/60   Pulse 70   Temp 98.7 F (37.1 C) (Oral)   Resp 19   SpO2 99%  Gen:   Awake, ill-appearing HEENT:  Atraumatic  Resp:  Normal effort  Cardiac:  Normal rate  Abd:   Nondistended MSK:   Moves extremities without difficulty  Neuro:  Speech clear, no facial droop, 5/5 strength in bilateral upper and lower extremities  Medical Decision Making  Medically screening exam initiated at 4:34 PM.    Patient appears very weak, does not meet criteria for code stroke and does not have focal neurologic deficits, appears more generally weak.  Will need acute bed for further evaluation.  Fortunately vitals are currently stable.  Clinical Impression  1. Weakness   Jacqlyn Larsen, [place] 06/28/20 1639

## 2020-06-28 NOTE — ED Provider Notes (Signed)
[place] EMERGENCY DEPARTMENT Provider Note   CSN: [id number] Arrival date & time: 06/28/20  1624     History No chief complaint on file.   April Page is a 75 y.o. female.  The history is provided by the patient, a relative and medical records. No language interpreter was used.  Illness Location:  Generalized weakness, leaning more to the right side than normal Severity:  Moderate Onset quality:  Gradual Duration:  3 days Timing:  Constant Progression:  Worsening Chronicity:  New Associated symptoms: cough and fatigue   Associated symptoms: no abdominal pain, no chest pain, no congestion, no diarrhea, no fever, no headaches, no loss of consciousness, no nausea, no shortness of breath, no vomiting and no wheezing        Past Medical History:  Diagnosis Date  . Arthritis    stiff knees  . Blood dyscrasia   . Cancer ([place])   . CVA (cerebral vascular accident) ([place])   . GERD (gastroesophageal reflux disease)   . H/O: CML (chronic myeloid leukemia)   . Hyperlipidemia   . Hyperlipidemia   . Hypertension   . Iron deficiency anemia 07/15/2018  . Obesity   . Pessary maintenance 07/01/2013  . Stroke [facility])    x 3    Patient Active Problem List   Diagnosis Date Noted  . Pain due to onychomycosis of toenails of both feet 06/04/2019  . Burn 06/04/2019  . Iron deficiency anemia 07/15/2018  . Bradycardia 05/07/2018  . Elevated troponin 05/07/2018  . Encopresis 05/07/2018  . Pleural effusion 05/07/2018  . AKI (acute kidney injury) ([place]) 05/07/2018  . Vaginal bleeding 11/04/2017  . Vaginal irritation from pessary ([place]) 11/04/2017  . Controlled diabetes mellitus type 2 with complications ([place]) 40/98/1191  . Physical deconditioning   . Acute ischemic stroke ([place]) 08/20/2016  . Pressure injury of skin 08/17/2016  . Acute CVA (cerebrovascular accident) ([place]) 08/17/2016  . UTI (urinary tract infection) 08/16/2016  . Constipation 07/10/2016  .  Dysphagia as late effect of cerebrovascular accident (CVA) 07/10/2016  . Loss of weight 07/10/2016  . Impaired glucose tolerance 06/05/2016  . Slurred speech   . Hypokalemia 04/13/2016  . Hyperglycemia 04/13/2016  . Essential hypertension 04/13/2016  . Hyperlipidemia 04/13/2016  . S/P right hip fracture 04/12/2016  . Hip fracture ([place]) 04/12/2016  . Fall   . Itching in the vaginal area 10/05/2014  . Chronic myelogenous leukemia (CML), BCR-ABL1-positive ([place])   . GERD (gastroesophageal reflux disease) 12/06/2013  . Leukocytosis 08/06/2013  . Atypical chest pain 08/06/2013  . Chronic diarrhea 08/05/2013  . Pessary maintenance 07/01/2013    Past Surgical History:  Procedure Laterality Date  . ABDOMINAL HYSTERECTOMY    . BIOPSY  08/20/2018   Procedure: BIOPSY;  Surgeon: [name], MD;  [facility];  Service: Endoscopy;;  gastric  . CHOLECYSTECTOMY    . COLONOSCOPY  06/09/2002   YNW:GNFAOZHY hemorrhoids; otherwise normal rectum, colon   . COLONOSCOPY N/A 08/25/2013   QMV:HQIONGE diverticulosis. Single colonic polyp-removed  s/p segmental biopsy and stool sample. random colon bx negative. +benign leiomyoma.  . cystocele/rectocele repair  2009  . ESOPHAGOGASTRODUODENOSCOPY  06/09/2002   XBM:WUXLKG upper gastrointestinal tract s/p  54-French Maloney dilator  . ESOPHAGOGASTRODUODENOSCOPY (EGD) WITH PROPOFOL N/A 08/20/2018   Dr. Gala Romney: erosive reflux esophagitis, gastritis but no h.pylori, hiatal hernia, s/p esophageal dilation  . FEMUR IM NAIL Right 04/12/2016   Procedure: INTRAMEDULLARY (IM) NAIL FEMORAL;  Surgeon: Nicholes Stairs, MD;  Location: [place];  Service: Orthopedics;  Laterality: Right;  . HERNIA REPAIR    . IR THORACENTESIS ASP PLEURAL SPACE W/IMG GUIDE  05/07/2018  . IR THORACENTESIS ASP PLEURAL SPACE W/IMG GUIDE  07/13/2019  . LOOP RECORDER INSERTION N/A 08/23/2016   Procedure: Loop Recorder Insertion;  Surgeon: Constance Haw, MD;  Location: [place] CV LAB;  Service: Cardiovascular;  Laterality: N/A;  . LOOP RECORDER REMOVAL N/A 05/07/2018   Procedure: LOOP RECORDER REMOVAL;  Surgeon: Constance Haw, MD;  Location: [place] CV LAB;  Service: Cardiovascular;  Laterality: N/A;  . Venia Minks DILATION N/A 08/20/2018   Procedure: Venia Minks DILATION;  Surgeon: [name], MD;  [facility];  Service: Endoscopy;  Laterality: N/A;  . OOPHORECTOMY    . PACEMAKER IMPLANT N/A 05/07/2018   Procedure: PACEMAKER IMPLANT;  Surgeon: Constance Haw, MD;  Location: [place] CV LAB;  Service: Cardiovascular;  Laterality: N/A;  . TEE WITHOUT CARDIOVERSION N/A 08/23/2016   Procedure: TRANSESOPHAGEAL ECHOCARDIOGRAM (TEE);  Surgeon: Fay Records, MD;  Location[facility] ENDOSCOPY;  Service: Cardiovascular;  Laterality: N/A;     OB History    Gravida  6   Para  6   Term      Preterm      AB      Living  5     SAB      IAB      Ectopic      Multiple      Live Births              Family History  Problem Relation Age of Onset  . Anuerysm Mother        deceased age 65, brain anuerysm  . Early death Mother 50  . Heart disease Father   . Hypertension Sister   . Obesity Brother   . Hypertension Sister   . Arthritis Sister   . Other Son        cardiac arrest  . Heart disease Child 6       cardiac arrest  . Colon cancer Neg Hx     Social History   Tobacco Use  . Smoking status: Never Smoker  . Smokeless tobacco: Never Used  Vaping Use  . Vaping Use: Never used  Substance Use Topics  . Alcohol use: No  . Drug use: No    Home Medications Prior to Admission medications   Medication Sig Start Date End Date Taking? Authorizing Provider  acetaminophen (TYLENOL) 325 MG tablet Take 2 tablets (650 mg total) by mouth every 6 (six) hours as needed for mild pain (or temp > 37.5 C (99.5 F)). 05/08/18   Elgergawy, Silver Huguenin, MD  amLODipine (NORVASC) 10 MG tablet Take 1 tablet (10 mg total) by mouth daily.  05/09/18   Elgergawy, Silver Huguenin, MD  aspirin 325 MG tablet Take 325 mg by mouth daily.    [provider]  atorvastatin (LIPITOR) 40 MG tablet Take 1 tablet (40 mg total) by mouth daily at 6 PM. 08/18/16   Samuella Cota, MD  dasatinib (SPRYCEL) 50 MG tablet TAKE 1 TABLET (50 MG TOTAL) BY MOUTH DAILY. 04/11/20   Derek Jack, MD  feeding supplement, ENSURE ENLIVE, (ENSURE ENLIVE) LIQD Take 237 mLs by mouth 2 (two) times daily between meals. 05/08/18   Elgergawy, Silver Huguenin, MD  furosemide (LASIX) 20 MG tablet Take 20 mg by mouth daily.  07/07/19   [provider]  hydrALAZINE (APRESOLINE) 25  MG tablet Take 25 mg by mouth 3 (three) times daily.  05/08/18   [provider]  losartan (COZAAR) 100 MG tablet Take 1 tablet (100 mg total) by mouth daily. Resume on 3/28 Patient taking differently: Take 100 mg by mouth daily. 08/18/16   Samuella Cota, MD  Multiple Vitamin (MULTIVITAMIN WITH MINERALS) TABS tablet Take 1 tablet by mouth daily.    [provider]  polyethylene glycol (MIRALAX / GLYCOLAX) packet Take 17 g by mouth daily. 06/06/16   Orson Eva, MD  sennosides-docusate sodium (SENOKOT-S) 8.6-50 MG tablet Take 1 tablet by mouth every evening.    [provider]    Allergies    Patient has no known allergies.  Review of Systems   Review of Systems  Constitutional: Positive for fatigue. Negative for chills and fever.  HENT: Negative for congestion.   Eyes: Negative for visual disturbance.  Respiratory: Positive for cough. Negative for chest tightness, shortness of breath and wheezing.   Cardiovascular: Negative for chest pain and palpitations.  Gastrointestinal: Negative for abdominal pain, constipation, diarrhea, nausea and vomiting.  Genitourinary: Negative for dysuria, flank pain and frequency.  Musculoskeletal: Negative for back pain, neck pain and neck stiffness.  Neurological: Positive for light-headedness. Negative for dizziness, loss  of consciousness, weakness and headaches.  Psychiatric/Behavioral: Negative for agitation and confusion.  All other systems reviewed and are negative.   Physical Exam Updated Vital Signs BP 131/65   Pulse 66   Temp 98.7 F (37.1 C) (Oral)   Resp 17   SpO2 99%   Physical Exam Vitals and nursing note reviewed.  Constitutional:      General: She is not in acute distress.    Appearance: She is well-developed. She is not ill-appearing, toxic-appearing or diaphoretic.  HENT:     Head: Normocephalic and atraumatic.     Mouth/Throat:     Mouth: Mucous membranes are moist.     Pharynx: No oropharyngeal exudate or posterior oropharyngeal erythema.  Eyes:     Extraocular Movements: Extraocular movements intact.     Conjunctiva/sclera: Conjunctivae normal.     Pupils: Pupils are equal, round, and reactive to light.  Cardiovascular:     Rate and Rhythm: Normal rate and regular rhythm.     Pulses: Normal pulses.     Heart sounds: No murmur heard.   Pulmonary:     Effort: Pulmonary effort is normal. No respiratory distress.     Breath sounds: Rales present. No wheezing or rhonchi.  Chest:     Chest wall: No tenderness.  Abdominal:     Palpations: Abdomen is soft.     Tenderness: There is no abdominal tenderness. There is no right CVA tenderness, left CVA tenderness, guarding or rebound.  Musculoskeletal:        General: No tenderness.     Cervical back: Neck supple. No tenderness.     Right lower leg: No edema.     Left lower leg: No edema.  Skin:    General: Skin is warm and dry.     Findings: No erythema.  Neurological:     General: No focal deficit present.     Mental Status: She is alert. Mental status is at baseline.     Sensory: No sensory deficit.     Motor: No weakness.     Coordination: Coordination normal.  Psychiatric:        Mood and Affect: Mood normal.     ED Results / Procedures / Treatments  Labs (all labs ordered are listed, but only abnormal results  are displayed) Labs Reviewed  COMPREHENSIVE METABOLIC PANEL - Abnormal; Notable for the following components:      Result Value   Glucose, Bld 106 (*)    BUN 48 (*)    Creatinine, Ser 1.55 (*)    GFR, Estimated 35 (*)    All other components within normal limits  CBC WITH DIFFERENTIAL/PLATELET - Abnormal; Notable for the following components:   WBC 3.7 (*)    RBC 3.43 (*)    Hemoglobin 10.2 (*)    HCT 32.5 (*)    All other components within normal limits  RESP PANEL BY RT-PCR (FLU A&B, COVID) ARPGX2  URINE CULTURE  TSH  AMMONIA  BRAIN NATRIURETIC PEPTIDE  LACTIC ACID, PLASMA  URINALYSIS, ROUTINE W REFLEX MICROSCOPIC  LACTIC ACID, PLASMA    EKG EKG Interpretation  Date/Time:  Wednesday June 28 2020 17:24:35 EDT Ventricular Rate:  64 PR Interval:  150 QRS Duration: 86 QT Interval:  405 QTC Calculation: 418 R Axis:   -17 Text Interpretation: Sinus rhythm Borderline left axis deviation Abnormal T, consider ischemia, anterior leads Minimal ST elevation, inferior leads When compared to prior, less pacesbeats seen. No STEMI Confirmed by Antony Blackbird [phone]) on 06/28/2020 5:38:36 PM   Radiology DG Chest 2 View  Result Date: 06/28/2020 CLINICAL DATA:  Weakness EXAM: CHEST - 2 VIEW COMPARISON:  07/13/2019 FINDINGS: Moderate to large right pleural effusion and small left pleural effusion. Right effusion has increased since prior study. Bibasilar atelectasis. Heart is enlarged. Pacer wires noted in the right heart, unchanged. IMPRESSION: Large right pleural effusion and small left pleural effusion. Bibasilar atelectasis. Electronically Signed   By: Rolm Baptise M.D.   On: 06/28/2020 17:18   CT Head Wo Contrast  Result Date: 06/28/2020 CLINICAL DATA:  Leaning to the right weak EXAM: CT HEAD WITHOUT CONTRAST TECHNIQUE: Contiguous axial images were obtained from the base of the skull through the vertex without intravenous contrast. COMPARISON:  CT brain 08/21/2016, MRI 08/20/2016  FINDINGS: Brain: No acute territorial infarction, hemorrhage or intracranial mass. Chronic infarct in the right basal ganglia and white matter. Mild chronic small vessel ischemic change of the white matter. Mild atrophy. Stable ventricle size. Vascular: No hyperdense vessels. Vertebral and carotid vascular calcification Skull: Normal. Negative for fracture. Exostosis at the left parietal bone as before. Sinuses/Orbits: No acute finding. Other: None IMPRESSION: 1. No CT evidence for acute intracranial abnormality. 2. Atrophy and mild chronic small vessel ischemic changes of the white matter. Chronic right basal ganglia and white matter infarct. Electronically Signed   By: Donavan Foil M.D.   On: 06/28/2020 23:20    Procedures Procedures   Medications Ordered in ED Medications - No data to display  ED Course  I have reviewed the triage vital signs and the nursing notes.  Pertinent labs & imaging results that were available during my care of the patient were reviewed by me and considered in my medical decision making (see chart for details).    MDM Rules/Calculators/A&P                          TRESEA HEINE is a 75 y.o. female with a past medical history significant for prior stroke, hypertension, hyperlipidemia, prior CML, and GERD who presents with several weeks of worsening fatigue and generalized weakness as well as a rapid worsening today including a new right-sided lean.  According to family, for  the last few weeks, patient has had worsening fatigue and weakness and has had several falls.  She did not fall today but this morning she was at her worsening baseline of generalized weakness when getting her ready for lunch she could not even pull her self up to get to the table.  This is much worse than before.  They do say the patient is leaning more to the right than she has in the past although they think she may have done it for the last few days.  They are concerned this is similar to when  she had prior stroke.  Patient is denying any headache or neck pain now.  She is denying any nausea, vomiting, fevers, or chills.  Patient has had a dry cough for the last few days and has also had some constipation.  She denies any abdominal pain or chest pain.  She denies palpitations.  Family thinks that her speech is at his baseline just, general fatigue and this new right-sided lean.  Patient uses a walker but has difficulty with transitions.  She lives at home alone but has family come help her at times.  On exam, she did not have any focal weakness.  She has generalized weakness in all extremities and cannot keep them raised for an entire 5 seconds in the legs.  She had symmetric grip strength.  Normal sensation throughout.  Patient is tending to lean more to the right in the bed but had symmetric smile.  Clear speech for me.  Pupil symmetric reactive normal extraocular movements.  Clinically I am concerned this could be recrudescence of prior stroke symptoms in the setting of some other abnormality causing generalized fatigue and weakness.  Will check urinalysis and will check x-ray given her cough.  Will check COVID and flu test.  We will get screening labs.  As this is going on for several days with the right-sided lean that is progressively worsening, will get MRI.  Patient has a pacemaker but appears to be MRI compatible.  We will also interrogate pacemaker.  We will get other labs.  Anticipate reassessment after work-up to determine disposition.  Patient reportedly was unable to get MRI due to pacemaker and leads.  Patient had a CT which did not show acute abnormality.  Patient's labs were overall reassuring although she is still waiting for urinalysis.  Had a long shared decision-making conversation with patient and family.  If urinalysis does show infection, it is reasonable for her to get antibiotics and admission for confusion and multiple falls in the setting of UTI.  If there is no  evidence of UTI, patient is likely stable for discharge home with plans to follow-up with PCP in the next several days for reassessment and repeat blood work.  Family is on board with this plan.  Care transferred to oncoming team while waiting for urinalysis to return.  Patient resting comfortably without new complaints.   Final Clinical Impression(s) / ED Diagnoses Final diagnoses:  Fatigue, unspecified type  Generalized weakness  Multiple falls    Clinical Impression: 1. Fatigue, unspecified type   2. Generalized weakness   3. Multiple falls     Disposition: Care transferred to oncoming team while waiting for urinalysis to return.  Patient resting comfortably without new complaints.  This note was prepared with assistance of Systems analyst. Occasional wrong-word or sound-a-like substitutions may have occurred due to the inherent limitations of voice recognition software.      Cariah Salatino, Harrell Gave  J, MD 06/29/20 [id number]

## 2020-06-28 NOTE — ED Triage Notes (Signed)
Pt here with c/oweakness and stroke like symptoms that started at 7 am this morning ,

## 2020-06-29 LAB — URINALYSIS, ROUTINE W REFLEX MICROSCOPIC
Bacteria, UA: NONE SEEN
Bilirubin Urine: NEGATIVE
Glucose, UA: NEGATIVE mg/dL
Hgb urine dipstick: NEGATIVE
Ketones, ur: NEGATIVE mg/dL
Nitrite: NEGATIVE
Protein, ur: NEGATIVE mg/dL
Specific Gravity, Urine: 1.013 (ref 1.005–1.030)
pH: 8 (ref 5.0–8.0)

## 2020-06-29 NOTE — ED Provider Notes (Signed)
  Provider Note MRN:  [id number]  Arrival date & time: 06/29/20    ED Course and Medical Decision Making  Assumed care from Dr. Sherry Ruffing at shift change.  Confusion, falls, unclear etiology.  Work-up reassuring, plan was to follow-up urinalysis and reassess.  3 AM update: Urinalysis is normal.  On my assessment patient is resting comfortably, easily wakes.  Daughter at bedside states patient is back to her baseline at this time.  No indication for further testing or admission, will follow-up with PCP over the next few days.  Procedures  Final Clinical Impressions(s) / ED Diagnoses     ICD-10-CM   1. Fatigue, unspecified type  R53.83   2. Generalized weakness  R53.1   3. Multiple falls  R29.6     ED Discharge Orders    None        Discharge Instructions     You were evaluated in the Emergency Department and after careful evaluation, we did not find any emergent condition requiring admission or further testing in the hospital.  Your exam/testing today was overall reassuring.  As discussed, recommend follow-up with PCP over the next few days to discuss symptoms and have a repeat evaluation.  Please return to the Emergency Department if you experience any worsening of your condition.  Thank you for allowing [place] to be a part of your care.     Barth Kirks. [place]onia Small, [place] [email or web address]    Maudie Flakes, MD 06/29/20 [phone]

## 2020-06-29 NOTE — Discharge Instructions (Addendum)
You were evaluated in the Emergency Department and after careful evaluation, we did not find any emergent condition requiring admission or further testing in the hospital.  Your exam/testing today was overall reassuring.  As discussed, recommend follow-up with PCP over the next few days to discuss symptoms and have a repeat evaluation.  Please return to the Emergency Department if you experience any worsening of your condition.  Thank you for allowing [place] to be a part of your care.

## 2020-07-07 ENCOUNTER — Other Ambulatory Visit (HOSPITAL_COMMUNITY): Payer: Self-pay | Admitting: Internal Medicine

## 2020-07-07 DIAGNOSIS — R630 Anorexia: Secondary | ICD-10-CM | POA: Diagnosis not present

## 2020-07-07 DIAGNOSIS — R0600 Dyspnea, unspecified: Secondary | ICD-10-CM

## 2020-07-07 DIAGNOSIS — I6930 Unspecified sequelae of cerebral infarction: Secondary | ICD-10-CM | POA: Diagnosis not present

## 2020-07-07 DIAGNOSIS — Z0000 Encounter for general adult medical examination without abnormal findings: Secondary | ICD-10-CM | POA: Diagnosis not present

## 2020-07-07 DIAGNOSIS — E782 Mixed hyperlipidemia: Secondary | ICD-10-CM | POA: Diagnosis not present

## 2020-07-07 DIAGNOSIS — E119 Type 2 diabetes mellitus without complications: Secondary | ICD-10-CM | POA: Diagnosis not present

## 2020-07-07 DIAGNOSIS — Z1389 Encounter for screening for other disorder: Secondary | ICD-10-CM | POA: Diagnosis not present

## 2020-07-07 DIAGNOSIS — E7849 Other hyperlipidemia: Secondary | ICD-10-CM | POA: Diagnosis not present

## 2020-07-07 DIAGNOSIS — Z1331 Encounter for screening for depression: Secondary | ICD-10-CM | POA: Diagnosis not present

## 2020-07-07 DIAGNOSIS — Z681 Body mass index (BMI) 19 or less, adult: Secondary | ICD-10-CM | POA: Diagnosis not present

## 2020-07-13 ENCOUNTER — Ambulatory Visit (HOSPITAL_COMMUNITY)
Admission: RE | Admit: 2020-07-13 | Discharge: 2020-07-13 | Disposition: A | Discharge status: Home / Self Care | Payer: Medicare HMO | Source: Ambulatory Visit | Attending: Internal Medicine | Admitting: Internal Medicine

## 2020-07-13 ENCOUNTER — Other Ambulatory Visit (HOSPITAL_COMMUNITY): Payer: Self-pay | Admitting: Internal Medicine

## 2020-07-13 ENCOUNTER — Other Ambulatory Visit: Payer: Self-pay

## 2020-07-13 DIAGNOSIS — J9811 Atelectasis: Secondary | ICD-10-CM | POA: Diagnosis not present

## 2020-07-13 DIAGNOSIS — R0602 Shortness of breath: Secondary | ICD-10-CM | POA: Diagnosis not present

## 2020-07-13 DIAGNOSIS — J90 Pleural effusion, not elsewhere classified: Secondary | ICD-10-CM | POA: Diagnosis not present

## 2020-07-13 DIAGNOSIS — R0600 Dyspnea, unspecified: Secondary | ICD-10-CM

## 2020-08-02 ENCOUNTER — Ambulatory Visit (INDEPENDENT_AMBULATORY_CARE_PROVIDER_SITE_OTHER): Payer: Medicare HMO

## 2020-08-02 DIAGNOSIS — I441 Atrioventricular block, second degree: Secondary | ICD-10-CM | POA: Diagnosis not present

## 2020-08-03 ENCOUNTER — Other Ambulatory Visit (HOSPITAL_COMMUNITY): Payer: Self-pay | Admitting: *Deleted

## 2020-08-03 DIAGNOSIS — C9210 Chronic myeloid leukemia, BCR/ABL-positive, not having achieved remission: Secondary | ICD-10-CM

## 2020-08-03 LAB — CUP PACEART REMOTE DEVICE CHECK
Battery Remaining Longevity: 128 mo
Battery Voltage: 3.01 V
Brady Statistic AP VP Percent: 5.15 %
Brady Statistic AP VS Percent: 0.64 %
Brady Statistic AS VP Percent: 63.33 %
Brady Statistic AS VS Percent: 30.88 %
Brady Statistic RA Percent Paced: 5.76 %
Brady Statistic RV Percent Paced: 68.48 %
Date Time Interrogation Session: 20220525011109
Implantable Lead Implant Date: 20200227
Implantable Lead Implant Date: 20200227
Implantable Lead Location: 753859
Implantable Lead Location: 753860
Implantable Lead Model: 5076
Implantable Lead Model: 5076
Implantable Pulse Generator Implant Date: 20200227
Lead Channel Impedance Value: 247 Ohm
Lead Channel Impedance Value: 304 Ohm
Lead Channel Impedance Value: 399 Ohm
Lead Channel Impedance Value: 437 Ohm
Lead Channel Pacing Threshold Amplitude: 0.5 V
Lead Channel Pacing Threshold Amplitude: 0.75 V
Lead Channel Pacing Threshold Pulse Width: 0.4 ms
Lead Channel Pacing Threshold Pulse Width: 0.4 ms
Lead Channel Sensing Intrinsic Amplitude: 0.875 mV
Lead Channel Sensing Intrinsic Amplitude: 0.875 mV
Lead Channel Sensing Intrinsic Amplitude: 6 mV
Lead Channel Sensing Intrinsic Amplitude: 6 mV
Lead Channel Setting Pacing Amplitude: 1.5 V
Lead Channel Setting Pacing Amplitude: 2 V
Lead Channel Setting Pacing Pulse Width: 0.4 ms
Lead Channel Setting Sensing Sensitivity: 2 mV

## 2020-08-03 MED ORDER — DASATINIB 50 MG PO TABS: ORAL_TABLET | ORAL | 3 refills | Status: AC

## 2020-08-04 ENCOUNTER — Encounter (HOSPITAL_COMMUNITY): Payer: Self-pay | Admitting: Hematology

## 2020-08-09 ENCOUNTER — Other Ambulatory Visit: Payer: Self-pay

## 2020-08-09 ENCOUNTER — Inpatient Hospital Stay (HOSPITAL_COMMUNITY): Payer: Medicare HMO | Attending: Hematology

## 2020-08-09 DIAGNOSIS — D509 Iron deficiency anemia, unspecified: Secondary | ICD-10-CM | POA: Insufficient documentation

## 2020-08-09 DIAGNOSIS — C9210 Chronic myeloid leukemia, BCR/ABL-positive, not having achieved remission: Secondary | ICD-10-CM | POA: Diagnosis not present

## 2020-08-09 DIAGNOSIS — Z7982 Long term (current) use of aspirin: Secondary | ICD-10-CM | POA: Insufficient documentation

## 2020-08-09 DIAGNOSIS — I10 Essential (primary) hypertension: Secondary | ICD-10-CM | POA: Insufficient documentation

## 2020-08-09 DIAGNOSIS — C9211 Chronic myeloid leukemia, BCR/ABL-positive, in remission: Secondary | ICD-10-CM

## 2020-08-09 DIAGNOSIS — Z79899 Other long term (current) drug therapy: Secondary | ICD-10-CM | POA: Insufficient documentation

## 2020-08-09 LAB — CBC WITH DIFFERENTIAL/PLATELET
Abs Immature Granulocytes: 0.01 10*3/uL (ref 0.00–0.07)
Basophils Absolute: 0 10*3/uL (ref 0.0–0.1)
Basophils Relative: 0 %
Eosinophils Absolute: 0 10*3/uL (ref 0.0–0.5)
Eosinophils Relative: 0 %
HCT: 36.7 % (ref 36.0–46.0)
Hemoglobin: 11.1 g/dL — ABNORMAL LOW (ref 12.0–15.0)
Immature Granulocytes: 0 %
Lymphocytes Relative: 22 %
Lymphs Abs: 1.2 10*3/uL (ref 0.7–4.0)
MCH: 29.1 pg (ref 26.0–34.0)
MCHC: 30.2 g/dL (ref 30.0–36.0)
MCV: 96.1 fL (ref 80.0–100.0)
Monocytes Absolute: 0.4 10*3/uL (ref 0.1–1.0)
Monocytes Relative: 8 %
Neutro Abs: 3.8 10*3/uL (ref 1.7–7.7)
Neutrophils Relative %: 70 %
Platelets: 289 10*3/uL (ref 150–400)
RBC: 3.82 MIL/uL — ABNORMAL LOW (ref 3.87–5.11)
RDW: 15.1 % (ref 11.5–15.5)
WBC: 5.5 10*3/uL (ref 4.0–10.5)
nRBC: 0 % (ref 0.0–0.2)

## 2020-08-09 LAB — COMPREHENSIVE METABOLIC PANEL
ALT: 28 U/L (ref 0–44)
AST: 37 U/L (ref 15–41)
Albumin: 3.9 g/dL (ref 3.5–5.0)
Alkaline Phosphatase: 102 U/L (ref 38–126)
Anion gap: 10 (ref 5–15)
BUN: 35 mg/dL — ABNORMAL HIGH (ref 8–23)
CO2: 25 mmol/L (ref 22–32)
Calcium: 9.4 mg/dL (ref 8.9–10.3)
Chloride: 102 mmol/L (ref 98–111)
Creatinine, Ser: 1.4 mg/dL — ABNORMAL HIGH (ref 0.44–1.00)
GFR, Estimated: 39 mL/min — ABNORMAL LOW (ref >60)
Glucose, Bld: 105 mg/dL — ABNORMAL HIGH (ref 70–99)
Potassium: 4.9 mmol/L (ref 3.5–5.1)
Sodium: 137 mmol/L (ref 135–145)
Total Bilirubin: 0.5 mg/dL (ref 0.3–1.2)
Total Protein: 8 g/dL (ref 6.5–8.1)

## 2020-08-09 LAB — FERRITIN: Ferritin: 325 ng/mL — ABNORMAL HIGH (ref 11–307)

## 2020-08-09 LAB — IRON AND TIBC
Iron: 30 ug/dL (ref 28–170)
Saturation Ratios: 10 % — ABNORMAL LOW (ref 10.4–31.8)
TIBC: 299 ug/dL (ref 250–450)
UIBC: 269 ug/dL

## 2020-08-11 ENCOUNTER — Encounter (HOSPITAL_COMMUNITY): Payer: Self-pay | Admitting: Emergency Medicine

## 2020-08-11 ENCOUNTER — Other Ambulatory Visit: Payer: Self-pay

## 2020-08-11 ENCOUNTER — Emergency Department (HOSPITAL_COMMUNITY)
Admission: EM | Admit: 2020-08-11 | Discharge: 2020-08-11 | Disposition: A | Discharge status: Home / Self Care | Payer: Medicare HMO | Attending: Emergency Medicine | Admitting: Emergency Medicine

## 2020-08-11 ENCOUNTER — Emergency Department (HOSPITAL_COMMUNITY): Payer: Medicare HMO

## 2020-08-11 DIAGNOSIS — E049 Nontoxic goiter, unspecified: Secondary | ICD-10-CM | POA: Diagnosis not present

## 2020-08-11 DIAGNOSIS — M25511 Pain in right shoulder: Secondary | ICD-10-CM | POA: Diagnosis not present

## 2020-08-11 DIAGNOSIS — J9811 Atelectasis: Secondary | ICD-10-CM | POA: Diagnosis not present

## 2020-08-11 DIAGNOSIS — Z85828 Personal history of other malignant neoplasm of skin: Secondary | ICD-10-CM | POA: Insufficient documentation

## 2020-08-11 DIAGNOSIS — E119 Type 2 diabetes mellitus without complications: Secondary | ICD-10-CM | POA: Diagnosis not present

## 2020-08-11 DIAGNOSIS — R001 Bradycardia, unspecified: Secondary | ICD-10-CM | POA: Diagnosis not present

## 2020-08-11 DIAGNOSIS — S42201A Unspecified fracture of upper end of right humerus, initial encounter for closed fracture: Secondary | ICD-10-CM | POA: Insufficient documentation

## 2020-08-11 DIAGNOSIS — I10 Essential (primary) hypertension: Secondary | ICD-10-CM | POA: Diagnosis not present

## 2020-08-11 DIAGNOSIS — S0990XA Unspecified injury of head, initial encounter: Secondary | ICD-10-CM | POA: Diagnosis not present

## 2020-08-11 DIAGNOSIS — W1839XA Other fall on same level, initial encounter: Secondary | ICD-10-CM | POA: Diagnosis not present

## 2020-08-11 DIAGNOSIS — J90 Pleural effusion, not elsewhere classified: Secondary | ICD-10-CM | POA: Insufficient documentation

## 2020-08-11 DIAGNOSIS — W19XXXA Unspecified fall, initial encounter: Secondary | ICD-10-CM

## 2020-08-11 DIAGNOSIS — J984 Other disorders of lung: Secondary | ICD-10-CM | POA: Diagnosis not present

## 2020-08-11 DIAGNOSIS — S42291A Other displaced fracture of upper end of right humerus, initial encounter for closed fracture: Secondary | ICD-10-CM | POA: Diagnosis not present

## 2020-08-11 DIAGNOSIS — J398 Other specified diseases of upper respiratory tract: Secondary | ICD-10-CM | POA: Diagnosis not present

## 2020-08-11 DIAGNOSIS — Y92009 Unspecified place in unspecified non-institutional (private) residence as the place of occurrence of the external cause: Secondary | ICD-10-CM | POA: Diagnosis not present

## 2020-08-11 DIAGNOSIS — S199XXA Unspecified injury of neck, initial encounter: Secondary | ICD-10-CM | POA: Diagnosis not present

## 2020-08-11 DIAGNOSIS — M25519 Pain in unspecified shoulder: Secondary | ICD-10-CM | POA: Diagnosis not present

## 2020-08-11 DIAGNOSIS — S4991XA Unspecified injury of right shoulder and upper arm, initial encounter: Secondary | ICD-10-CM | POA: Diagnosis not present

## 2020-08-11 DIAGNOSIS — Z8673 Personal history of transient ischemic attack (TIA), and cerebral infarction without residual deficits: Secondary | ICD-10-CM | POA: Diagnosis not present

## 2020-08-11 MED ORDER — FENTANYL CITRATE (PF) 100 MCG/2ML IJ SOLN
50.0000 ug | Freq: Once | INTRAMUSCULAR | Status: AC
  Administered 2020-08-11: 50 ug via INTRAVENOUS
  Filled 2020-08-11: qty 2

## 2020-08-11 NOTE — ED Triage Notes (Signed)
Pt from home with daughter and fell backwards this morning injuring her right shoulder.

## 2020-08-11 NOTE — Discharge Instructions (Signed)
You were seen in the emergency department today after a fall.  You have a fracture to the right arm.  We have placed you in a sling to help with comfort and healing.  Please follow with the orthopedic surgeon listed in the office who can advise on further management.  He may take Tylenol for pain and apply ice as needed for swelling.   Your CT scans today showed other incidental findings.  You have reaccumulated fluid in the right lung as well as the left lung.  You have required drainage of this fluid in the past and I would like for you to call your primary doctor who can help to arrange this again as an outpatient.   We also found a large goiter in the neck and surrounding your breathing tube.  Radiology is recommending that you follow with a surgeon and I have listed the name of an ear nose and throat doctor.  Please call to schedule the next available appointment.  I would also like your primary doctor to be aware of this.  If you develop trouble breathing you should return to the emergency department immediately for reevaluation.
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
## 2020-08-16 ENCOUNTER — Ambulatory Visit (HOSPITAL_COMMUNITY): Payer: Medicare HMO | Admitting: Hematology

## 2020-08-16 ENCOUNTER — Other Ambulatory Visit: Payer: Self-pay

## 2020-08-16 ENCOUNTER — Encounter: Payer: Self-pay | Admitting: Orthopedic Surgery

## 2020-08-16 ENCOUNTER — Ambulatory Visit: Payer: Medicare HMO | Admitting: Orthopedic Surgery

## 2020-08-16 VITALS — BP 90/52 | HR 81 | Ht 65.0 in | Wt 110.0 lb

## 2020-08-16 DIAGNOSIS — S42294A Other nondisplaced fracture of upper end of right humerus, initial encounter for closed fracture: Secondary | ICD-10-CM | POA: Diagnosis not present

## 2020-08-16 NOTE — Patient Instructions (Signed)
Humerus Fracture Treated With Immobilization  The humerus is the large bone in the upper arm. A broken (fractured) humerus is often treated by wearing a cast, splint, or sling (immobilization). This holds the broken pieces in place so they can heal. What are the causes? This condition may be caused by:  A fall.  A hard, direct hit to the arm.  A car accident. What increases the risk? You are more likely to develop this condition if:  You are elderly.  You have a disease that makes the bones thin and weak. What are the signs or symptoms?  Pain.  Swelling.  Bruising.  Not being able to move your arm normally. How is this treated? Treatment involves wearing a cast, splint, or sling until your arm heals enough for you to begin range-of-motion exercises. You may also be prescribed pain medicine. Follow these instructions at home: If you have a cast:  Do not stick anything inside the cast to scratch your skin.  Check the skin around the cast every day. Tell your doctor if you have any concerns.  You may put lotion on dry skin around the edges of the cast. Do not put lotion on the skin under the cast.  Keep the cast clean and dry. If you have a splint or sling:  Wear the splint or sling as told by your doctor. Remove it only as told by your doctor.  Loosen the splint or sling if your fingers: ? Tingle. ? Become numb. ? Turn cold and blue.  Keep the splint or sling clean and dry. Bathing  Do not take baths, swim, or use a hot tub until your doctor says that you can. Ask your doctor if you may take showers. You may only be allowed to take sponge baths.  If your cast, splint, or sling is not waterproof: ? Do not let it get wet. ? Cover it with a watertight covering when you take a bath or shower.  If you have a sling, remove it for bathing only if your doctor says this is okay. Managing pain, stiffness, and swelling  If told, put ice on the injured area. ? If you  have a removable splint or sling, remove it as told by your doctor. ? Put ice in a plastic bag. ? Place a towel between your skin and the bag or between your cast and the bag. ? Leave the ice on for 20 minutes, 2-3 times a day.  Move your fingers often.  Raise (elevate) the injured area above the level of your heart while you are sitting or lying down.   Driving  Do not drive or use heavy machinery while taking prescription pain medicine.  Do not drive while wearing a cast, splint, or sling on an arm that you use for driving. Activity  Return to your normal activities as told by your doctor. Ask your doctor what activities are safe for you.  Do not lift anything until your doctor says that it is safe.  Do range-of-motion exercises only as told by your doctor. General instructions  Do not put pressure on any part of the cast or splint until it is fully hardened. This may take many hours.  Do not use any products that contain nicotine or tobacco, such as cigarettes, e-cigarettes, and chewing tobacco. These can delay bone healing. If you need help quitting, ask your doctor.  Take over-the-counter and prescription medicines only as told by your doctor.  Ask your doctor if the   medicine you are taking can cause trouble pooping (constipation). You may need to take steps to prevent or treat trouble pooping: ? Drink enough fluid to keep your pee (urine) pale yellow. ? Take over-the-counter or prescription medicines. ? Eat foods that are high in fiber. These include beans, whole grains, and fresh fruits and vegetables. ? Limit foods that are high in fat and sugar. These include fried or sweet foods.  Keep all follow-up visits as told by your doctor. This is important. Contact a doctor if:  You have any new pain, swelling, or bruising.  Your pain, swelling, and bruising do not get better.  Your cast, splint, or sling becomes loose or damaged. Get help right away if:  Your skin or  fingers on your injured arm turn blue or gray.  Your arm is cold or numb.  You have very bad pain in your injured arm. Summary  The humerus is the large bone in the upper arm.  A broken humerus is often treated by wearing a cast, splint, or sling.  Wear a splint or sling as told by your doctor. Remove it only as told by your doctor.  Move your fingers often. This information is not intended to replace advice given to you by your health care provider. Make sure you discuss any questions you have with your health care provider. Document Revised: 10/27/2017 Document Reviewed: 10/27/2017 Elsevier Patient Education  2021 Elsevier Inc.  

## 2020-08-16 NOTE — Progress Notes (Signed)
  Assessment and plan  Encounter Diagnosis  Name Primary?  . Other closed nondisplaced fracture of proximal end of right humerus, initial encounter Yes   75 year old female with history of CVA ambulates with a walker, she is total care  [place] [address] and injured her right shoulder proximal humerus fracture which we will treat nonoperatively  2 weeks in a sling  X-ray in 2 weeks right shoulder  Start physical therapy at that time at home  Chief Complaint  Patient presents with  . Shoulder Pain    Right 08/11/20 date of injury     75 year old female CVA history walks with a walker cared for at home with a sitter and sent family support fell injured her right shoulder on June 3  Treated with a sling  Patient here with caregiver who is giving the history    Past Medical History:  Diagnosis Date  . Arthritis    stiff knees  . Blood dyscrasia   . Cancer ([place])   . CVA (cerebral vascular accident) ([place])   . GERD (gastroesophageal reflux disease)   . H/O: CML (chronic myeloid leukemia)   . Hyperlipidemia   . Hyperlipidemia   . Hypertension   . Iron deficiency anemia 07/15/2018  . Obesity   . Pessary maintenance 07/01/2013  . Stroke ([facility])    x 3   BP (!) 90/52   Pulse 81   Ht 5\' 5"  (1.651 m)   Wt 110 lb (49.9 kg)   BMI 18.30 kg/m   Exam the patient is essentially aphasic  She comes in in a wheelchair slumped over.  I had to wake her up.  She seemed to acknowledge my presence  Her daughter is here to give the history  She is in a sling and swath.  Her skin is intact.  Color and capillary refill are intact  She is tender over the proximal right shoulder with some swelling  X-rays from the hospital  My interpretation of the x-ray: Proximal humerus fracture slightly comminuted minimally displaced meets near criteria for nonoperative treatment  Recommend 2 weeks in a sling then x-ray right shoulder  Then order physical therapy for home due to her lack of mobility

## 2020-08-17 LAB — BCR-ABL1, CML/ALL, PCR, QUANT: Interpretation (BCRAL):: NEGATIVE

## 2020-08-22 DIAGNOSIS — E559 Vitamin D deficiency, unspecified: Secondary | ICD-10-CM | POA: Diagnosis not present

## 2020-08-22 DIAGNOSIS — L89302 Pressure ulcer of unspecified buttock, stage 2: Secondary | ICD-10-CM | POA: Diagnosis not present

## 2020-08-22 DIAGNOSIS — Z681 Body mass index (BMI) 19 or less, adult: Secondary | ICD-10-CM | POA: Diagnosis not present

## 2020-08-22 DIAGNOSIS — I959 Hypotension, unspecified: Secondary | ICD-10-CM | POA: Diagnosis not present

## 2020-08-22 DIAGNOSIS — E1165 Type 2 diabetes mellitus with hyperglycemia: Secondary | ICD-10-CM | POA: Diagnosis not present

## 2020-08-22 DIAGNOSIS — E11319 Type 2 diabetes mellitus with unspecified diabetic retinopathy without macular edema: Secondary | ICD-10-CM | POA: Diagnosis not present

## 2020-08-22 DIAGNOSIS — R5383 Other fatigue: Secondary | ICD-10-CM | POA: Diagnosis not present

## 2020-08-22 DIAGNOSIS — R42 Dizziness and giddiness: Secondary | ICD-10-CM | POA: Diagnosis not present

## 2020-08-22 DIAGNOSIS — C92Z0 Other myeloid leukemia not having achieved remission: Secondary | ICD-10-CM | POA: Diagnosis not present

## 2020-08-22 DIAGNOSIS — R2689 Other abnormalities of gait and mobility: Secondary | ICD-10-CM | POA: Diagnosis not present

## 2020-08-22 DIAGNOSIS — Z79899 Other long term (current) drug therapy: Secondary | ICD-10-CM | POA: Diagnosis not present

## 2020-08-24 DIAGNOSIS — C9210 Chronic myeloid leukemia, BCR/ABL-positive, not having achieved remission: Secondary | ICD-10-CM | POA: Diagnosis not present

## 2020-08-24 DIAGNOSIS — S42301D Unspecified fracture of shaft of humerus, right arm, subsequent encounter for fracture with routine healing: Secondary | ICD-10-CM | POA: Diagnosis not present

## 2020-08-24 DIAGNOSIS — E1165 Type 2 diabetes mellitus with hyperglycemia: Secondary | ICD-10-CM | POA: Diagnosis not present

## 2020-08-24 DIAGNOSIS — I959 Hypotension, unspecified: Secondary | ICD-10-CM | POA: Diagnosis not present

## 2020-08-24 DIAGNOSIS — K222 Esophageal obstruction: Secondary | ICD-10-CM | POA: Diagnosis not present

## 2020-08-24 DIAGNOSIS — S31000D Unspecified open wound of lower back and pelvis without penetration into retroperitoneum, subsequent encounter: Secondary | ICD-10-CM | POA: Diagnosis not present

## 2020-08-24 DIAGNOSIS — E11319 Type 2 diabetes mellitus with unspecified diabetic retinopathy without macular edema: Secondary | ICD-10-CM | POA: Diagnosis not present

## 2020-08-24 DIAGNOSIS — R42 Dizziness and giddiness: Secondary | ICD-10-CM | POA: Diagnosis not present

## 2020-08-24 DIAGNOSIS — I10 Essential (primary) hypertension: Secondary | ICD-10-CM | POA: Diagnosis not present

## 2020-08-25 DIAGNOSIS — S42301D Unspecified fracture of shaft of humerus, right arm, subsequent encounter for fracture with routine healing: Secondary | ICD-10-CM | POA: Diagnosis not present

## 2020-08-25 DIAGNOSIS — I959 Hypotension, unspecified: Secondary | ICD-10-CM | POA: Diagnosis not present

## 2020-08-25 DIAGNOSIS — E11319 Type 2 diabetes mellitus with unspecified diabetic retinopathy without macular edema: Secondary | ICD-10-CM | POA: Diagnosis not present

## 2020-08-25 DIAGNOSIS — E1165 Type 2 diabetes mellitus with hyperglycemia: Secondary | ICD-10-CM | POA: Diagnosis not present

## 2020-08-25 DIAGNOSIS — S31000D Unspecified open wound of lower back and pelvis without penetration into retroperitoneum, subsequent encounter: Secondary | ICD-10-CM | POA: Diagnosis not present

## 2020-08-25 DIAGNOSIS — C9210 Chronic myeloid leukemia, BCR/ABL-positive, not having achieved remission: Secondary | ICD-10-CM | POA: Diagnosis not present

## 2020-08-25 DIAGNOSIS — K222 Esophageal obstruction: Secondary | ICD-10-CM | POA: Diagnosis not present

## 2020-08-25 DIAGNOSIS — R42 Dizziness and giddiness: Secondary | ICD-10-CM | POA: Diagnosis not present

## 2020-08-25 DIAGNOSIS — I10 Essential (primary) hypertension: Secondary | ICD-10-CM | POA: Diagnosis not present

## 2020-08-28 DIAGNOSIS — I959 Hypotension, unspecified: Secondary | ICD-10-CM | POA: Diagnosis not present

## 2020-08-28 DIAGNOSIS — K222 Esophageal obstruction: Secondary | ICD-10-CM | POA: Diagnosis not present

## 2020-08-28 DIAGNOSIS — E11319 Type 2 diabetes mellitus with unspecified diabetic retinopathy without macular edema: Secondary | ICD-10-CM | POA: Diagnosis not present

## 2020-08-28 DIAGNOSIS — S42301D Unspecified fracture of shaft of humerus, right arm, subsequent encounter for fracture with routine healing: Secondary | ICD-10-CM | POA: Diagnosis not present

## 2020-08-28 DIAGNOSIS — I10 Essential (primary) hypertension: Secondary | ICD-10-CM | POA: Diagnosis not present

## 2020-08-28 DIAGNOSIS — S31000D Unspecified open wound of lower back and pelvis without penetration into retroperitoneum, subsequent encounter: Secondary | ICD-10-CM | POA: Diagnosis not present

## 2020-08-28 DIAGNOSIS — R42 Dizziness and giddiness: Secondary | ICD-10-CM | POA: Diagnosis not present

## 2020-08-28 DIAGNOSIS — C9210 Chronic myeloid leukemia, BCR/ABL-positive, not having achieved remission: Secondary | ICD-10-CM | POA: Diagnosis not present

## 2020-08-28 DIAGNOSIS — E1165 Type 2 diabetes mellitus with hyperglycemia: Secondary | ICD-10-CM | POA: Diagnosis not present

## 2020-08-28 NOTE — Progress Notes (Signed)
Remote pacemaker transmission.   

## 2020-08-30 ENCOUNTER — Other Ambulatory Visit (HOSPITAL_COMMUNITY): Payer: Self-pay

## 2020-08-30 DIAGNOSIS — S42201D Unspecified fracture of upper end of right humerus, subsequent encounter for fracture with routine healing: Secondary | ICD-10-CM | POA: Insufficient documentation

## 2020-08-30 DIAGNOSIS — C9210 Chronic myeloid leukemia, BCR/ABL-positive, not having achieved remission: Secondary | ICD-10-CM

## 2020-08-31 ENCOUNTER — Emergency Department (HOSPITAL_COMMUNITY)
Admission: EM | Admit: 2020-08-31 | Discharge: 2020-09-08 | Disposition: E | Payer: Medicare HMO | Attending: Emergency Medicine | Admitting: Emergency Medicine

## 2020-08-31 ENCOUNTER — Encounter: Payer: Self-pay | Admitting: Orthopedic Surgery

## 2020-08-31 ENCOUNTER — Ambulatory Visit: Payer: Medicare HMO

## 2020-08-31 ENCOUNTER — Encounter: Payer: Self-pay | Admitting: Emergency Medicine

## 2020-08-31 ENCOUNTER — Other Ambulatory Visit: Payer: Self-pay

## 2020-08-31 ENCOUNTER — Ambulatory Visit (INDEPENDENT_AMBULATORY_CARE_PROVIDER_SITE_OTHER): Payer: Medicare HMO | Admitting: Orthopedic Surgery

## 2020-08-31 DIAGNOSIS — Z950 Presence of cardiac pacemaker: Secondary | ICD-10-CM | POA: Diagnosis not present

## 2020-08-31 DIAGNOSIS — I10 Essential (primary) hypertension: Secondary | ICD-10-CM | POA: Diagnosis not present

## 2020-08-31 DIAGNOSIS — I129 Hypertensive chronic kidney disease with stage 1 through stage 4 chronic kidney disease, or unspecified chronic kidney disease: Secondary | ICD-10-CM | POA: Insufficient documentation

## 2020-08-31 DIAGNOSIS — Z79899 Other long term (current) drug therapy: Secondary | ICD-10-CM | POA: Insufficient documentation

## 2020-08-31 DIAGNOSIS — R4182 Altered mental status, unspecified: Secondary | ICD-10-CM | POA: Diagnosis not present

## 2020-08-31 DIAGNOSIS — N189 Chronic kidney disease, unspecified: Secondary | ICD-10-CM | POA: Diagnosis not present

## 2020-08-31 DIAGNOSIS — I959 Hypotension, unspecified: Secondary | ICD-10-CM | POA: Diagnosis not present

## 2020-08-31 DIAGNOSIS — E1122 Type 2 diabetes mellitus with diabetic chronic kidney disease: Secondary | ICD-10-CM | POA: Diagnosis not present

## 2020-08-31 DIAGNOSIS — K222 Esophageal obstruction: Secondary | ICD-10-CM | POA: Diagnosis not present

## 2020-08-31 DIAGNOSIS — S42294D Other nondisplaced fracture of upper end of right humerus, subsequent encounter for fracture with routine healing: Secondary | ICD-10-CM | POA: Diagnosis not present

## 2020-08-31 DIAGNOSIS — R092 Respiratory arrest: Secondary | ICD-10-CM | POA: Diagnosis not present

## 2020-08-31 DIAGNOSIS — E11319 Type 2 diabetes mellitus with unspecified diabetic retinopathy without macular edema: Secondary | ICD-10-CM | POA: Diagnosis not present

## 2020-08-31 DIAGNOSIS — R531 Weakness: Secondary | ICD-10-CM | POA: Diagnosis present

## 2020-08-31 DIAGNOSIS — Z7982 Long term (current) use of aspirin: Secondary | ICD-10-CM | POA: Diagnosis not present

## 2020-08-31 DIAGNOSIS — Z8673 Personal history of transient ischemic attack (TIA), and cerebral infarction without residual deficits: Secondary | ICD-10-CM | POA: Insufficient documentation

## 2020-08-31 DIAGNOSIS — R42 Dizziness and giddiness: Secondary | ICD-10-CM | POA: Diagnosis not present

## 2020-08-31 DIAGNOSIS — C9210 Chronic myeloid leukemia, BCR/ABL-positive, not having achieved remission: Secondary | ICD-10-CM | POA: Diagnosis not present

## 2020-08-31 DIAGNOSIS — E1165 Type 2 diabetes mellitus with hyperglycemia: Secondary | ICD-10-CM | POA: Diagnosis not present

## 2020-08-31 DIAGNOSIS — Z859 Personal history of malignant neoplasm, unspecified: Secondary | ICD-10-CM | POA: Insufficient documentation

## 2020-08-31 DIAGNOSIS — S31000D Unspecified open wound of lower back and pelvis without penetration into retroperitoneum, subsequent encounter: Secondary | ICD-10-CM | POA: Diagnosis not present

## 2020-08-31 DIAGNOSIS — C801 Malignant (primary) neoplasm, unspecified: Secondary | ICD-10-CM | POA: Insufficient documentation

## 2020-08-31 DIAGNOSIS — R0603 Acute respiratory distress: Secondary | ICD-10-CM | POA: Diagnosis not present

## 2020-08-31 DIAGNOSIS — S42301D Unspecified fracture of shaft of humerus, right arm, subsequent encounter for fracture with routine healing: Secondary | ICD-10-CM | POA: Diagnosis not present

## 2020-09-01 MED FILL — Medication: Qty: 1 | Status: AC

## 2020-09-08 ENCOUNTER — Ambulatory Visit: Payer: Medicare HMO | Admitting: Podiatry

## 2020-09-08 NOTE — Progress Notes (Signed)
FRACTURE CARE   DOI: 08/11/2020  Encounter Diagnosis  Name Primary?   Other closed nondisplaced fracture of proximal end of right humerus with routine healing, subsequent encounter 08/11/20 Yes   TREATING: SLING, NON OPERATIVE  3 week f/u s/p proximal humerus fracture right shoulder   (H/O) CVA, prior to fall)  Seems to be failing to thrive,  The xrays show apex posterior angulation modest displacement but certainly acceptable   I told her family member to see her PMD for evaluation  I m going to rec PT but I m doubtful its going to help;   Return in 2 months ck ROM

## 2020-09-08 NOTE — ED Notes (Addendum)
Michael at [place] and [employer] home notified of pt death and need for pick up in the morgue.  Family took all patience belongings.

## 2020-09-08 NOTE — Addendum Note (Signed)
Addended byCandice Camp on: 09/10/2020 09:17 AM   Modules accepted: Orders

## 2020-09-08 NOTE — Progress Notes (Signed)
Chaplain referred by ED Nursing that patient is in process of code and daughter is distrught in family room. Arrived to find patient daughter Renee crying and with an ED NA. Chaplain provided spiritual presence, de-escalating breathing until [name] was able to find emotional equilibrium. Chaplain walked into treatment room to find that patient had passed. Provided comfort, spiritual support, opportunity for life review, and prayer bedside today. Three daughters, one grand daughter, and one great grand daughter present at the hospital during encounter. Chaplain will remain available in order to provide spiritual support and to assess for spiritual need.

## 2020-09-08 NOTE — ED Triage Notes (Signed)
Poor appetite with decrease in blood pressure onset 2 days ago

## 2020-09-08 NOTE — ED Notes (Signed)
Chaplain in the room while family to see/sit with patient. Pt belongings bagged in 2 personal bags on top of pt personal w/c.

## 2020-09-08 NOTE — ED Notes (Signed)
Called [facility] (Dr. Gerarda Fraction)  Delman Cheadle covering Dr. Gerarda Fraction and spoke with Dr. Roderic Palau.

## 2020-09-08 NOTE — ED Notes (Signed)
Nurse went in to room to assess pt. 2 techs and daughter were discussing how to move pt onto the bed from w/c due to right arm in a sling. Daughter requested to just pick up mother and place her on the bed. Nurse helped daughter pull pt up on bed to hook up to v/s machine. Nurse noted pt was nonresponsive. Nurse asked daughter was she talking in triage and was she a full code or DNR, and if siblings would agree. Daughter stated Full Code and nurse requested Dr and respiratory therapy for CPR from staff. Code called about 1440 hrs. CPR began.

## 2020-09-08 NOTE — Patient Instructions (Signed)
Start therapy   See primary care doctor for evaluation of loss of energy

## 2020-09-08 NOTE — ED Triage Notes (Signed)
Altered mental status

## 2020-09-08 NOTE — ED Notes (Signed)
[facility] contacted. Spoke with Retia Passe: referral number: [id number]. 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  Contact address for [facility] [address]  [place], [place] [phone]

## 2020-09-08 NOTE — ED Provider Notes (Signed)
[name] EMERGENCY DEPARTMENT Provider Note   CSN: [id number] Arrival date & time: September 11, 2020  1418     History Chief Complaint  Patient presents with   Altered Mental Status    April Page is a 75 y.o. female.  Patient has a history of CML and chronic kidney disease.  She also has a history of severe malnutrition and anemia with recent hip fracture.  She has not eaten or had anything to drink for 2 days.  When she was seen in triage she was obtunded with shallow breaths.  The patient was brought back to room 3 and immediately arrested.  The history is provided by a relative. No language interpreter was used.  Weakness Severity:  Severe Onset quality:  Gradual Duration: Many days. Timing:  Constant Progression:  Worsening Chronicity:  Recurrent Context: not alcohol use   Relieved by:  Nothing Worsened by:  Nothing Ineffective treatments:  None tried     Past Medical History:  Diagnosis Date   Arthritis    stiff knees   Blood dyscrasia    Cancer ([place])    CVA (cerebral vascular accident) ([place])    GERD (gastroesophageal reflux disease)    H/O: CML (chronic myeloid leukemia)    Hyperlipidemia    Hyperlipidemia    Hypertension    Iron deficiency anemia 07/15/2018   Obesity    Pessary maintenance 07/01/2013   Stroke ([place])    x 3    Patient Active Problem List   Diagnosis Date Noted   Cancer ([place]) 09/11/20   Closed fracture of proximal end of right humerus with routine healing 08/30/2020   Pain due to onychomycosis of toenails of both feet 06/04/2019   Burn 06/04/2019   Iron deficiency anemia 07/15/2018   Bradycardia 05/07/2018   Elevated troponin 05/07/2018   Encopresis 05/07/2018   Pleural effusion 05/07/2018   AKI (acute kidney injury) ([place]) 05/07/2018   Acute kidney failure, unspecified ([place]) 05/07/2018   Vaginal bleeding 11/04/2017   Vaginal irritation from pessary ([place]) 11/04/2017   Controlled diabetes mellitus type 2 with complications ([place])  70/26/3785   Physical deconditioning    Acute ischemic stroke ([place]) 08/20/2016   Pressure injury of skin 08/17/2016   Acute CVA (cerebrovascular accident) ([place]) 08/17/2016   UTI (urinary tract infection) 08/16/2016   Constipation 07/10/2016   Dysphagia as late effect of cerebrovascular accident (CVA) 07/10/2016   Loss of weight 07/10/2016   Impaired glucose tolerance 06/05/2016   Slurred speech    Hypokalemia 04/13/2016   Hyperglycemia 04/13/2016   Essential hypertension 04/13/2016   Hyperlipidemia 04/13/2016   S/P right hip fracture 04/12/2016   Hip fracture ([place]) 04/12/2016   Fall    Itching in the vaginal area 10/05/2014   Chronic myelogenous leukemia (CML), BCR-ABL1-positive (HCC)    GERD (gastroesophageal reflux disease) 12/06/2013   Leukocytosis 08/06/2013   Atypical chest pain 08/06/2013   Chronic diarrhea 08/05/2013   Pessary maintenance 07/01/2013    Past Surgical History:  Procedure Laterality Date   ABDOMINAL HYSTERECTOMY     BIOPSY  08/20/2018   Procedure: BIOPSY;  Surgeon: [name], MD;  [facility];  Service: Endoscopy;;  gastric   CHOLECYSTECTOMY     COLONOSCOPY  06/09/2002   YIF:OYDXAJOI hemorrhoids; otherwise normal rectum, colon    COLONOSCOPY N/A 08/25/2013   NOM:VEHMCNO diverticulosis. Single colonic polyp-removed  s/p segmental biopsy and stool sample. random colon bx negative. +benign leiomyoma.   cystocele/rectocele repair  2009   ESOPHAGOGASTRODUODENOSCOPY  06/09/2002  QQI:WLNLGX upper gastrointestinal tract s/p  54-French Maloney dilator   ESOPHAGOGASTRODUODENOSCOPY (EGD) WITH PROPOFOL N/A 08/20/2018   Dr. Gala Romney: erosive reflux esophagitis, gastritis but no h.pylori, hiatal hernia, s/p esophageal dilation   FEMUR IM NAIL Right 04/12/2016   Procedure: INTRAMEDULLARY (IM) NAIL FEMORAL;  Surgeon: Nicholes Stairs, MD;  Location: [place];  Service: Orthopedics;  Laterality: Right;   HERNIA REPAIR     IR THORACENTESIS ASP PLEURAL SPACE  W/IMG GUIDE  05/07/2018   IR THORACENTESIS ASP PLEURAL SPACE W/IMG GUIDE  07/13/2019   LOOP RECORDER INSERTION N/A 08/23/2016   Procedure: Loop Recorder Insertion;  Surgeon: Constance Haw, MD;  Location: [place] [facility];  Service: Cardiovascular;  Laterality: N/A;   LOOP RECORDER REMOVAL N/A 05/07/2018   Procedure: LOOP RECORDER REMOVAL;  Surgeon: Constance Haw, MD;  Location: [place] [facility];  Service: Cardiovascular;  Laterality: N/A;   MALONEY DILATION N/A 08/20/2018   Procedure: Venia Minks DILATION;  Surgeon: [name], MD;  [facility];  Service: Endoscopy;  Laterality: N/A;   OOPHORECTOMY     PACEMAKER IMPLANT N/A 05/07/2018   Procedure: PACEMAKER IMPLANT;  Surgeon: Constance Haw, MD;  Location: [place] [facility];  Service: Cardiovascular;  Laterality: N/A;   TEE WITHOUT CARDIOVERSION N/A 08/23/2016   Procedure: TRANSESOPHAGEAL ECHOCARDIOGRAM (TEE);  Surgeon: Fay Records, MD;  Location: [facility] ENDOSCOPY;  Service: Cardiovascular;  Laterality: N/A;     OB History     Gravida  6   Para  6   Term      Preterm      AB      Living  5      SAB      IAB      Ectopic      Multiple      Live Births              Family History  Problem Relation Age of Onset   Anuerysm Mother        deceased age 69, brain anuerysm   Early death Mother 26   Heart disease Father    Hypertension Sister    Obesity Brother    Hypertension Sister    Arthritis Sister    Other Son        cardiac arrest   Heart disease Child 19       cardiac arrest   Colon cancer Neg Hx     Social History   Tobacco Use   Smoking status: Never   Smokeless tobacco: Never  Vaping Use   Vaping Use: Never used  Substance Use Topics   Alcohol use: No   Drug use: No    Home Medications Prior to Admission medications   Medication Sig Start Date End Date Taking? Authorizing Provider  acetaminophen (TYLENOL) 325 MG tablet Take 2 tablets (650 mg total) by mouth  every 6 (six) hours as needed for mild pain (or temp > 37.5 C (99.5 F)). 05/08/18   Elgergawy, Silver Huguenin, MD  amLODipine (NORVASC) 10 MG tablet Take 1 tablet (10 mg total) by mouth daily. Patient not taking: Reported on September 08, 2020 05/09/18   Elgergawy, Silver Huguenin, MD  aspirin 325 MG tablet Take 325 mg by mouth daily.    [provider]  atorvastatin (LIPITOR) 40 MG tablet Take 1 tablet (40 mg total) by mouth daily at 6 PM. Patient not taking: Reported on 09-08-2020 08/18/16   Samuella Cota, MD  dasatinib [facility]) 50  MG tablet TAKE 1 TABLET (50 MG TOTAL) BY MOUTH DAILY. 08/03/20   Derek Jack, MD  feeding supplement, ENSURE ENLIVE, (ENSURE ENLIVE) LIQD Take 237 mLs by mouth 2 (two) times daily between meals. 05/08/18   Elgergawy, Silver Huguenin, MD  furosemide (LASIX) 20 MG tablet Take 20 mg by mouth daily. 07/07/19   [provider]  hydrALAZINE (APRESOLINE) 25 MG tablet Take 25 mg by mouth 3 (three) times daily.  05/08/18   [provider]  losartan (COZAAR) 100 MG tablet Take 1 tablet (100 mg total) by mouth daily. Resume on 3/28 Patient taking differently: Take 100 mg by mouth daily. 08/18/16   Samuella Cota, MD  Multiple Vitamin (MULTIVITAMIN WITH MINERALS) TABS tablet Take 1 tablet by mouth daily.    [provider]  polyethylene glycol (MIRALAX / GLYCOLAX) packet Take 17 g by mouth daily. 06/06/16   Orson Eva, MD  sennosides-docusate sodium (SENOKOT-S) 8.6-50 MG tablet Take 1 tablet by mouth every evening.    [provider]    Allergies    Patient has no known allergies.  Review of Systems   Review of Systems  Unable to perform ROS: Mental status change  Neurological:  Positive for weakness.   Physical Exam Updated Vital Signs BP 104/72 (BP Location: Left Arm)   Pulse (!) 138   Temp 98.9 F (37.2 C) (Axillary)   Resp 16   Physical Exam Vitals and nursing note reviewed.  Constitutional:      Appearance: She is well-developed.      Comments: Patient was unresponsive with no respiratory rate or heartbeat  HENT:     Head: Normocephalic.     Nose: Nose normal.  Eyes:     General: No scleral icterus.    Conjunctiva/sclera: Conjunctivae normal.  Neck:     Thyroid: No thyromegaly.  Cardiovascular:     Heart sounds: No murmur heard.   No friction rub. No gallop.     Comments: No heartbeat at all.  CPR was performed Pulmonary:     Breath sounds: No stridor. No wheezing or rales.  Chest:     Chest wall: No tenderness.  Abdominal:     General: There is no distension.     Tenderness: There is no abdominal tenderness. There is no rebound.  Musculoskeletal:     Cervical back: Neck supple.     Comments: Extremities were not moving because patient was not breathing  Lymphadenopathy:     Cervical: No cervical adenopathy.  Skin:    Findings: No erythema or rash.  Neurological:     Motor: No abnormal muscle tone.     Coordination: Coordination normal.     Comments: Not responding to any painful stimuli    ED Results / Procedures / Treatments   Labs (all labs ordered are listed, but only abnormal results are displayed) Labs Reviewed - No data to display  EKG None  Radiology DG Shoulder Right  Result Date: September 17, 2020 Formatting of this result is different from the original. Images from the original result were not included. DG Shoulder Right Priority: Routine   Accession #[id number] Exam Info Performing Dept Ordering Info Ordering Dept 2020-09-17  8:52 AM [facility]  [phone] September 17, 2020  8:35 AM [facility] [place]  [phone]   Technologist Ordered Procedure Ordering Provider [place], [place] Shoulder Right Carole Civil, MD  [phone]   Not Available   Order Comments None  2 views right shoulder Mild displacement and angulation of  the proximal humerus fracture Apex posterior angulation Impression: stable proximal hum frx with mild angulation and displacement     Procedures Date/Time: 2020-09-10 3:34 PM Performed by: Milton Ferguson, MD      Medications Ordered in ED Medications - No data to display  ED Course  I have reviewed the triage vital signs and the nursing notes.  Pertinent labs & imaging results that were available during my care of the patient were reviewed by me and considered in my medical decision making (see chart for details). CRITICAL CARE Performed by: Milton Ferguson Total critical care time:35 minutes Critical care time was exclusive of separately billable procedures and treating other patients. Critical care was necessary to treat or prevent imminent or life-threatening deterioration. Critical care was time spent personally by me on the following activities: development of treatment plan with patient and/or surrogate as well as nursing, discussions with consultants, evaluation of patient's response to treatment, examination of patient, obtaining history from patient or surrogate, ordering and performing treatments and interventions, ordering and review of laboratory studies, ordering and review of radiographic studies, pulse oximetry and re-evaluation of patient's condition. Patient had appropriate CPR and ACLS done.  She never regained spontaneous breaths or a heartbeat.  Patient was pronounced dead at 2:55 PM.  I spoke with Dr. Nolon Rod office and they agreed to have the death certificate sent to them for filling out.   MDM Rules/Calculators/A&P                          Cardiopulmonary arrest most likely secondary to severe dehydration and severe malnutrition Final Clinical Impression(s) / ED Diagnoses Final diagnoses:  Respiratory arrest [facility])    Rx / DC Orders ED Discharge Orders     None        Milton Ferguson, MD 09/10/2020 1535

## 2020-09-08 DEATH — deceased

## 2020-11-02 ENCOUNTER — Ambulatory Visit: Payer: Medicare HMO | Admitting: Orthopedic Surgery

## 2020-12-19 ENCOUNTER — Other Ambulatory Visit (HOSPITAL_COMMUNITY): Payer: Self-pay | Admitting: *Deleted

## 2022-10-01 IMAGING — CT CT CERVICAL SPINE W/O CM
3 series · 12 of 27 positions shown, 14 images · non-contrast
Comparison: Prior head CT examinations 06/28/2020 and earlier. CT
head/neck 08/21/2016. Report from thyroid biopsy 09/05/2011.

CLINICAL DATA: Head trauma, minor. Additional history provided:
Fall this morning injuring right shoulder. History of hypertension
and stroke.

EXAM:
CT HEAD WITHOUT CONTRAST
CT CERVICAL SPINE WITHOUT CONTRAST
TECHNIQUE: Multidetector CT imaging of the head and cervical spine was
performed following the standard protocol without intravenous
contrast. Multiplanar CT image reconstructions of the cervical spine
were also generated.

[Series 4: c spine soft · axial · 0.59mm/px · z∈[-122,-74]mm · 3 of 84 slices shown]
[im 12/84  soft-tissue]
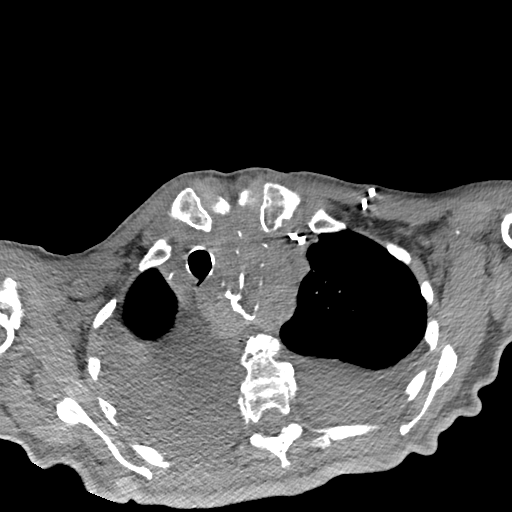
[im 24/84  soft-tissue]
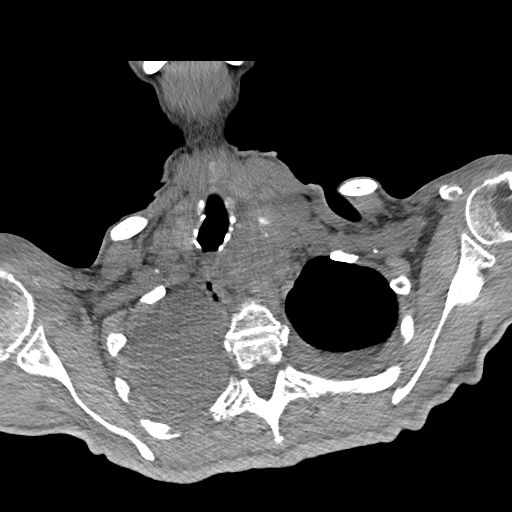
[im 36/84  soft-tissue]
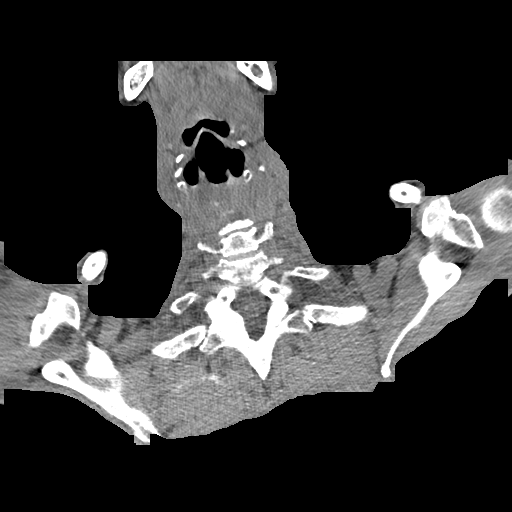

[Series 5: sagittal bone · sagittal · 0.28mm/px · 5 of 61 slices shown, 6 images]
[im 21/61  bone]
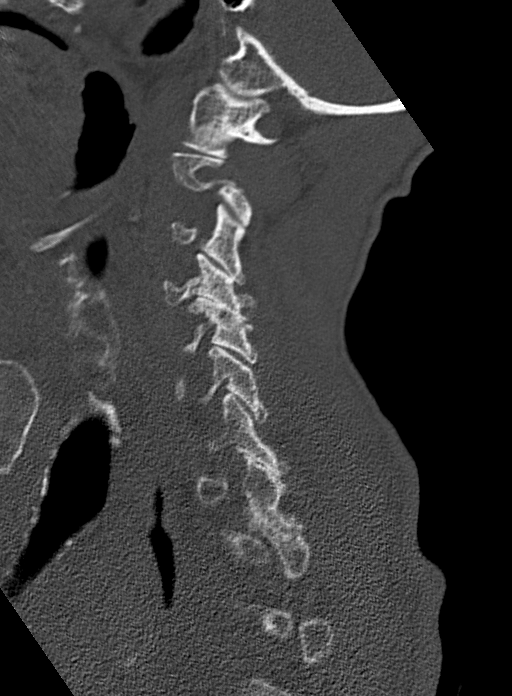
[im 26/61  bone]
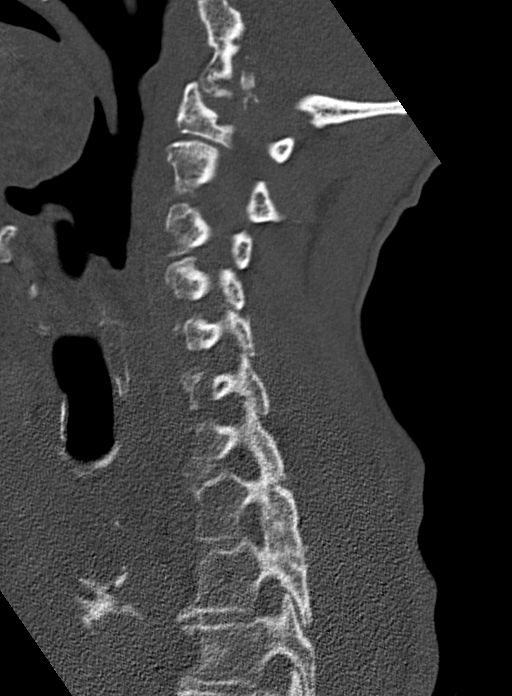
[im 31/61  soft-tissue]
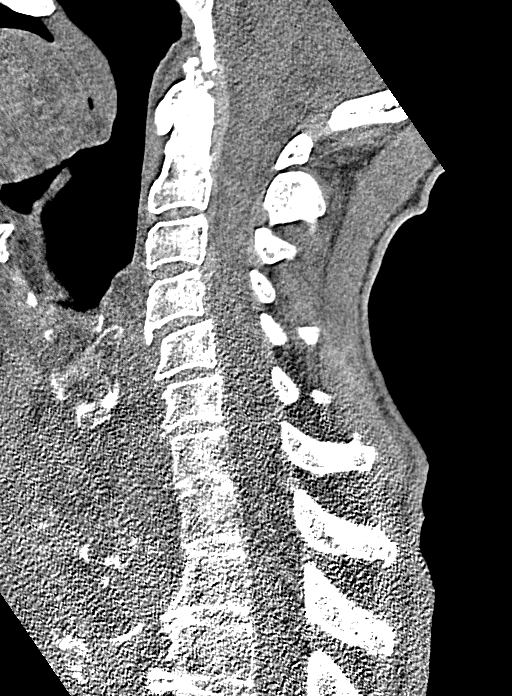
[im 31/61  bone]
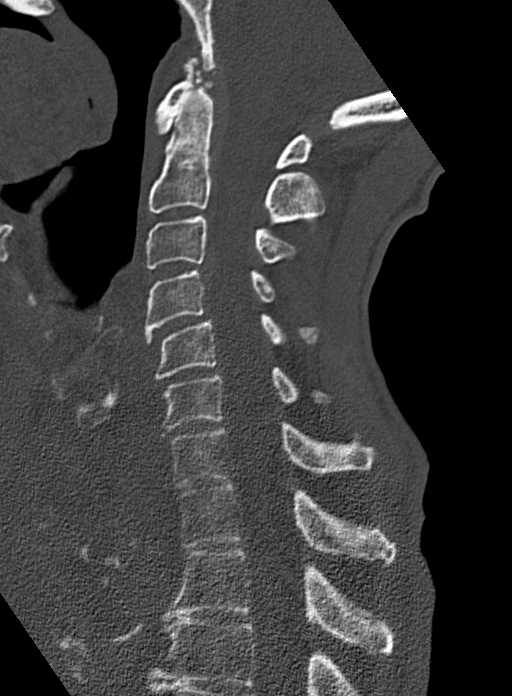
[im 36/61  bone]
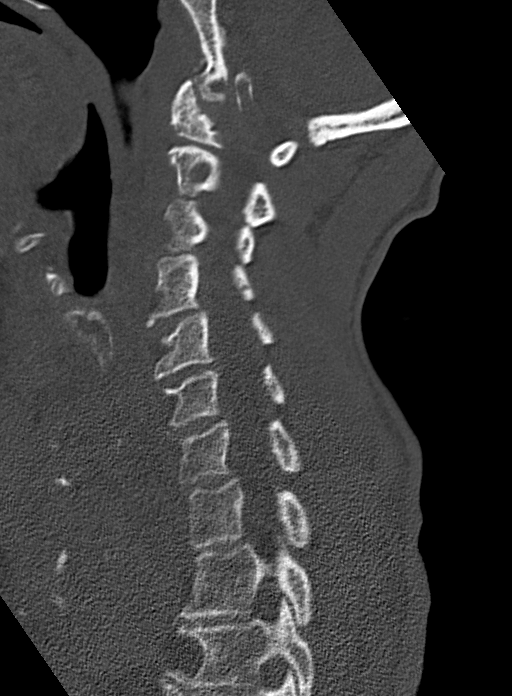
[im 41/61  bone]
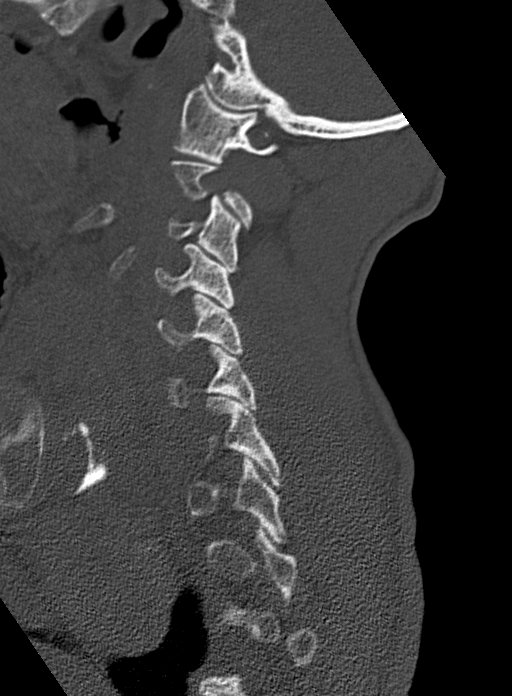

[Series 7: coronal bone · axial · 0.26mm/px · z∈[-36,+30]mm · 4 of 66 slices shown, 5 images]
[im 14/66  soft-tissue]
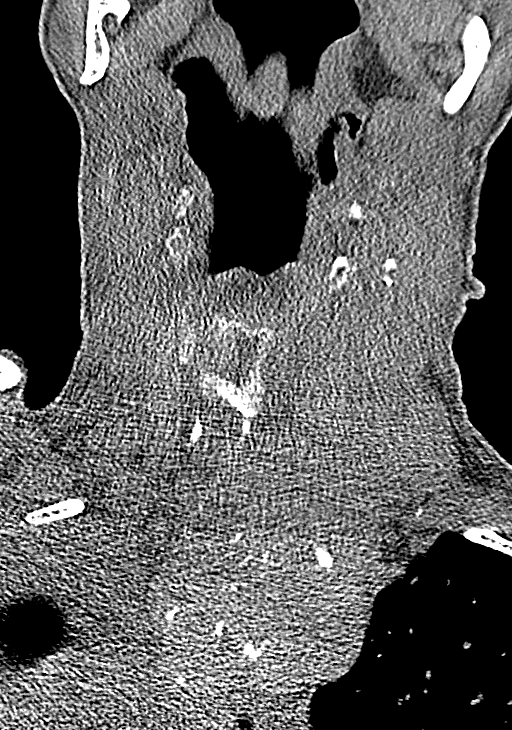
[im 14/66  bone]
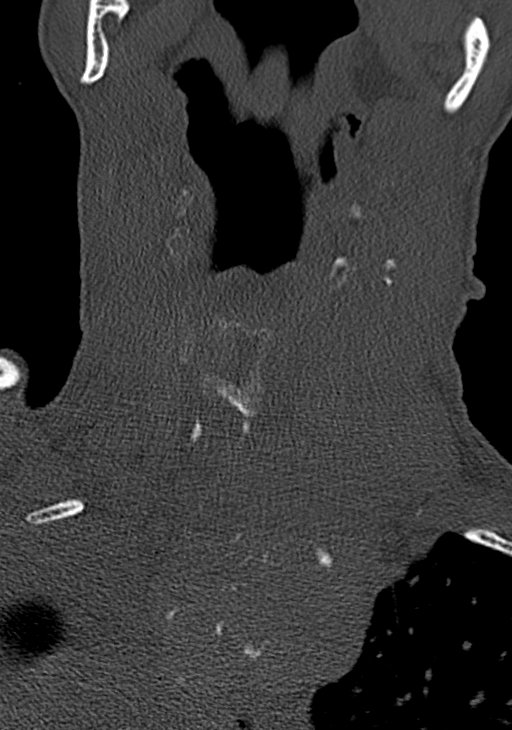
[im 27/66  bone]
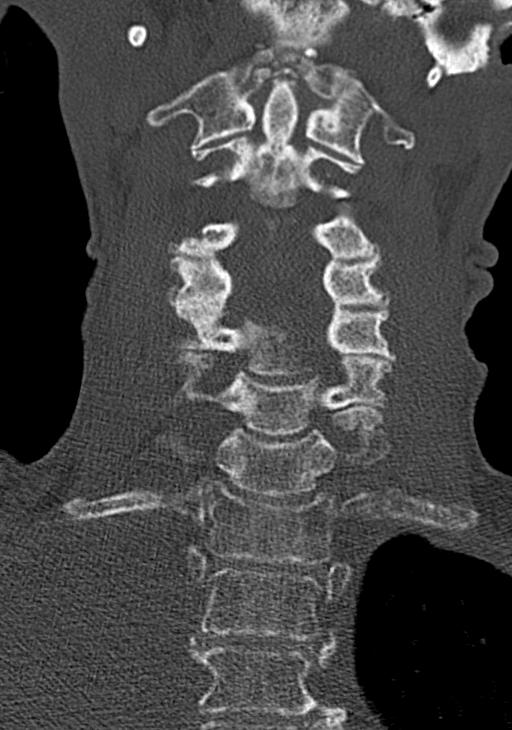
[im 40/66  bone]
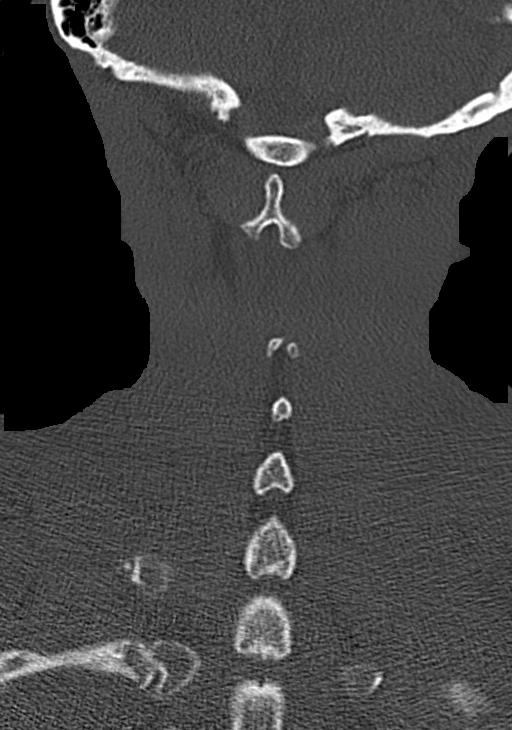
[im 53/66  bone]
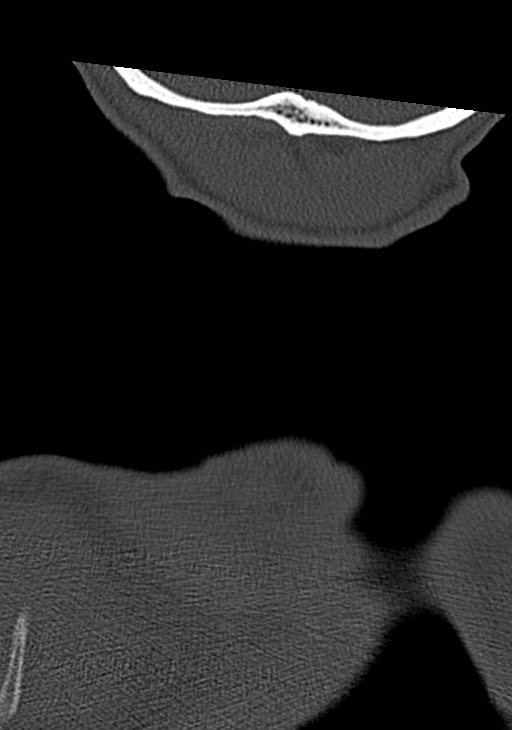

[12 of 27 positions shown; findings below may reference images not displayed]

FINDINGS: CT HEAD FINDINGS

Brain:

Mild-to-moderate generalized cerebral atrophy. Comparatively mild
cerebellar atrophy.

Known chronic infarcts within the corpus callosum, right corona
radiata, right basal ganglia and right thalamus.

Background mild patchy and ill-defined hypoattenuation within the
cerebral white matter, nonspecific but compatible with chronic small
vessel ischemic disease.

There is no acute intracranial hemorrhage.

No demarcated cortical infarct.

No extra-axial fluid collection.

No evidence of intracranial mass.

No midline shift.

Vascular: No hyperdense vessel.  Atherosclerotic calcifications.

Skull: No calvarial fracture. Unchanged left parietal calvarial
exostosis.

Sinuses/Orbits: Visualized orbits show no acute finding. No
significant paranasal sinus disease at the imaged levels.

CT CERVICAL SPINE FINDINGS

Alignment: Straightening of the expected cervical lordosis. Trace
C3-C4 grade 1 retrolisthesis. 2 mm C4-C5 grade 1 anterolisthesis.
Trace C5-C6 grade 1 anterolisthesis.

Skull base and vertebrae: The basion-dental and atlanto-dental
intervals are maintained.No evidence of acute fracture to the
cervical spine.

Soft tissues and spinal canal: No prevertebral fluid or swelling. No
visible canal hematoma.

Disc levels: Cervical spondylosis with multilevel disc space
narrowing, disc bulges, uncovertebral hypertrophy and facet
arthrosis. No appreciable high-grade spinal canal stenosis.
Multilevel bony neural foraminal narrowing. Possible right T1-T2
facet joint ankylosis.

Upper chest: Reported separately. Partially imaged right greater
than left pleural effusions.

Other: Partially imaged acute fracture of the proximal right
humerus. Redemonstrated partially imaged very large multinodular
thyroid goiter measuring at least 10 cm and extending into the
mediastinum. Associated rightward deviation of the subglottic
trachea.
IMPRESSION: CT head:

1. No evidence of acute intracranial abnormality.
2. Stable parenchymal atrophy with chronic infarcts and chronic
small vessel ischemic disease, as described.

CT cervical spine:

1. No evidence of acute fracture to the cervical spine.
2. Straightening of the expected cervical lordosis.
3. Mild grade 1 spondylolisthesis at C3-C4, C4-C5 and C5-C6 as
described.
4. Cervical spondylosis, as described.
5. Redemonstrated and partially imaged very large multinodular
thyroid goiter measuring at least 10 cm with extension into the
mediastinum. Associated rightward deviation of the subglottic
trachea. Surgical consultation is recommended, if not already
obtained.
6. Partially imaged acute fracture of the proximal right humerus.
7. Please refer to the concurrently performed chest CT for a
description of intrathoracic findings.

## 2022-10-01 IMAGING — CT CT HEAD W/O CM
3 series · 14 of 47 positions shown, 16 images · non-contrast
Comparison: Prior head CT examinations 06/28/2020 and earlier. CT
head/neck 08/21/2016. Report from thyroid biopsy 09/05/2011.

CLINICAL DATA: Head trauma, minor. Additional history provided:
Fall this morning injuring right shoulder. History of hypertension
and stroke.

EXAM:
CT HEAD WITHOUT CONTRAST
CT CERVICAL SPINE WITHOUT CONTRAST
TECHNIQUE: Multidetector CT imaging of the head and cervical spine was
performed following the standard protocol without intravenous
contrast. Multiplanar CT image reconstructions of the cervical spine
were also generated.

[Series 2: head w o · axial · 0.46mm/px · z∈[-18,+112]mm · 8 of 32 slices shown, 10 images]
[im 3/32  brain]
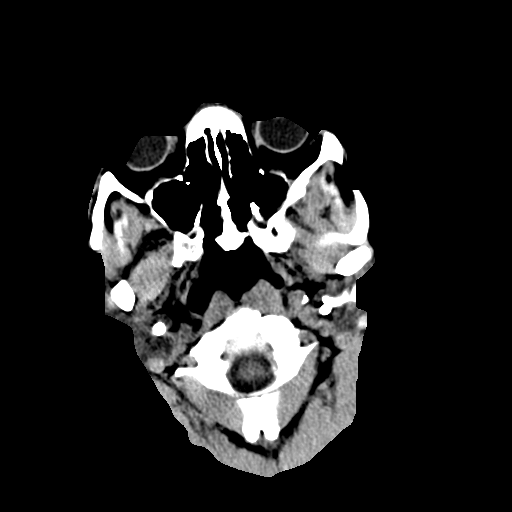
[im 3/32  bone]
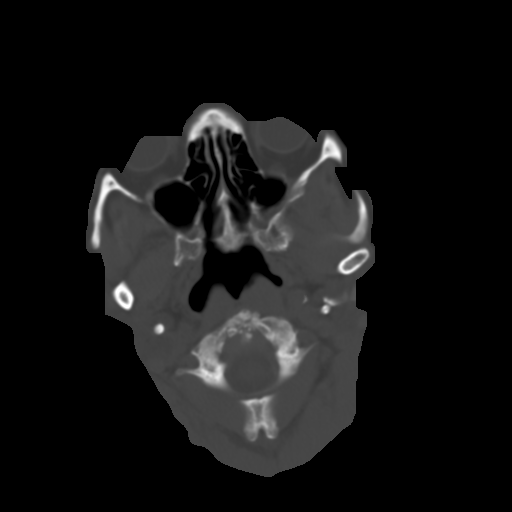
[im 7/32  brain]
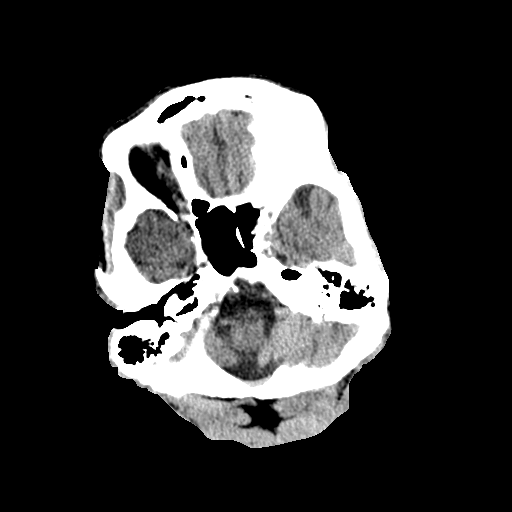
[im 10/32  brain]
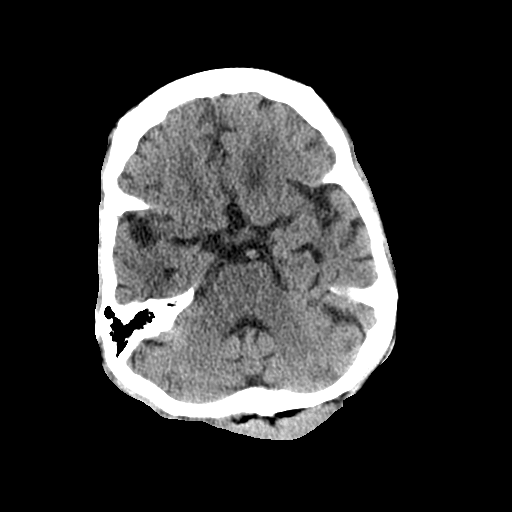
[im 14/32  brain]
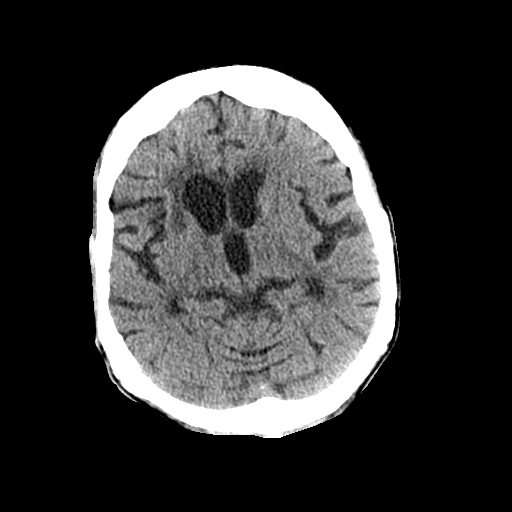
[im 18/32  brain]
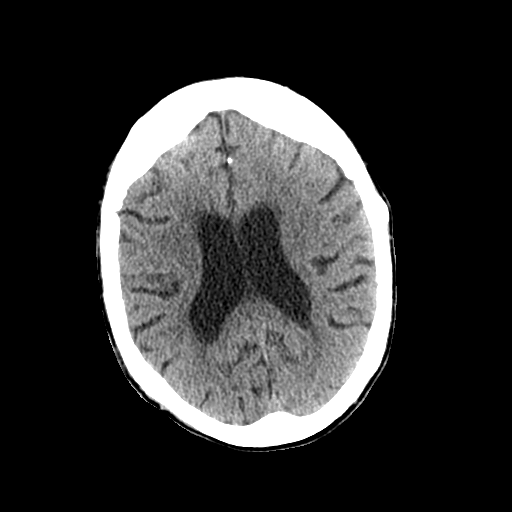
[im 18/32  bone]
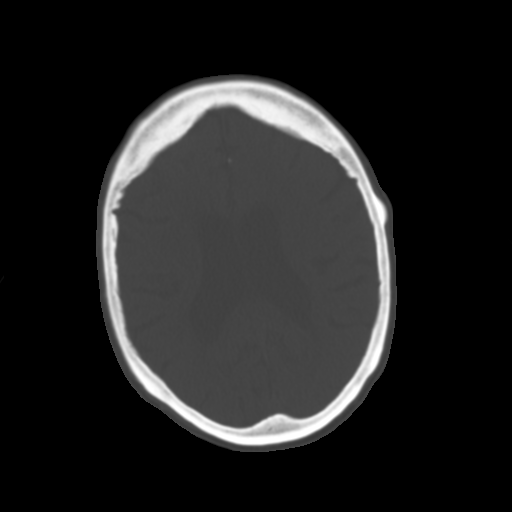
[im 22/32  brain]
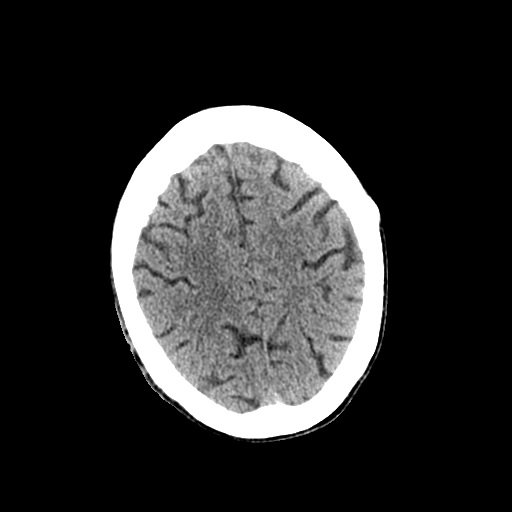
[im 25/32  brain]
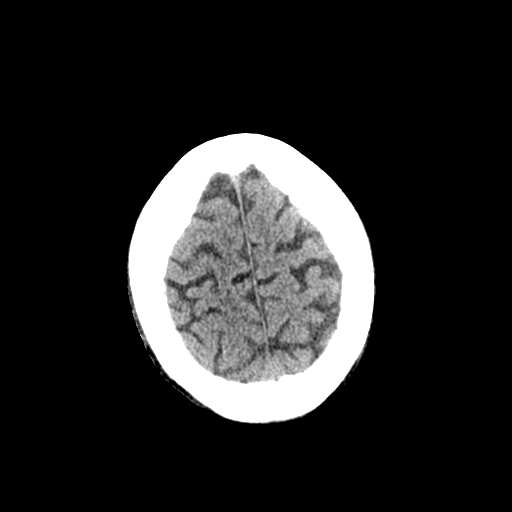
[im 29/32  brain]
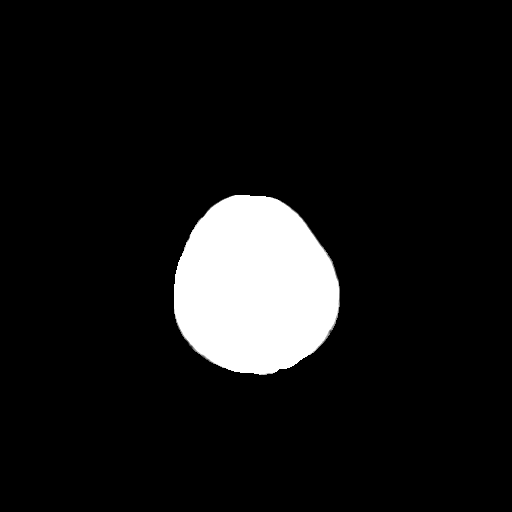

[Series 4: coronal soft · coronal · 0.35mm/px · 3 of 66 slices shown]
[im 22/66  brain]
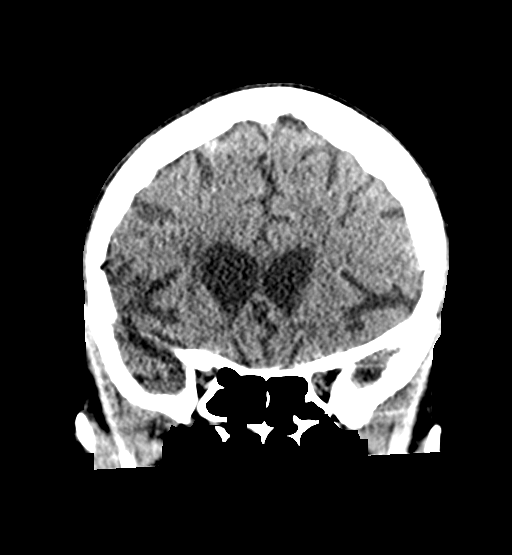
[im 29/66  brain]
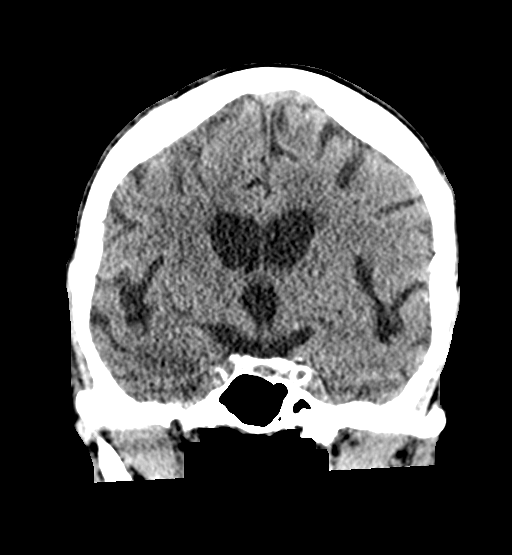
[im 37/66  brain]
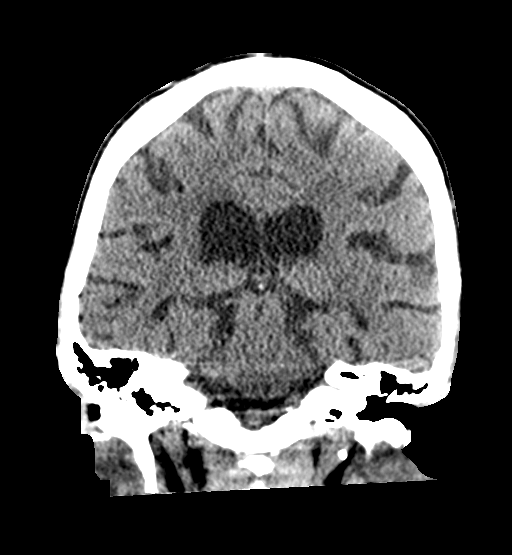

[Series 5: sagittal soft · sagittal · 0.39mm/px · 3 of 51 slices shown]
[im 17/51  brain]
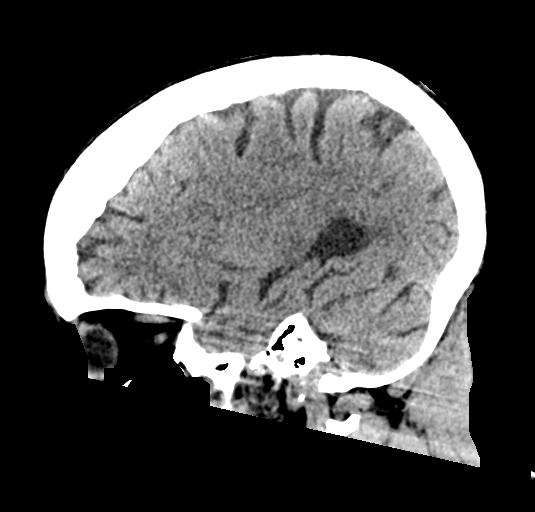
[im 26/51  brain]
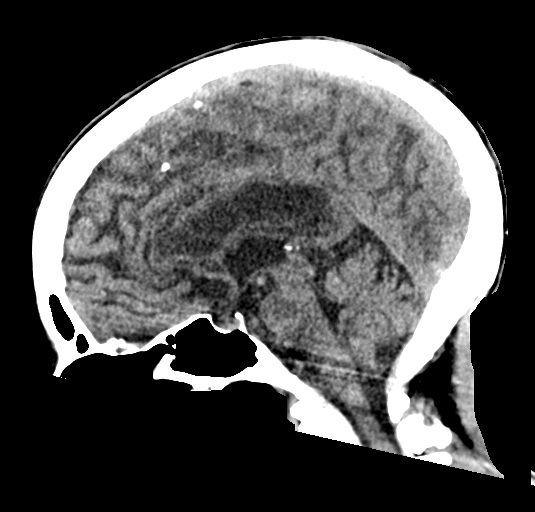
[im 34/51  brain]
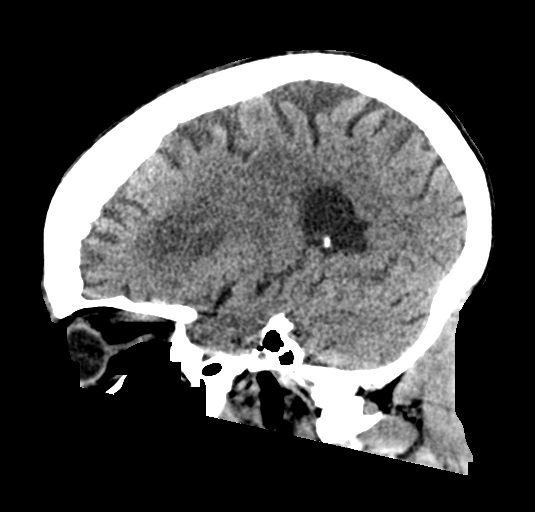

[14 of 47 positions shown; findings below may reference images not displayed]

FINDINGS: CT HEAD FINDINGS

Brain:

Mild-to-moderate generalized cerebral atrophy. Comparatively mild
cerebellar atrophy.

Known chronic infarcts within the corpus callosum, right corona
radiata, right basal ganglia and right thalamus.

Background mild patchy and ill-defined hypoattenuation within the
cerebral white matter, nonspecific but compatible with chronic small
vessel ischemic disease.

There is no acute intracranial hemorrhage.

No demarcated cortical infarct.

No extra-axial fluid collection.

No evidence of intracranial mass.

No midline shift.

Vascular: No hyperdense vessel.  Atherosclerotic calcifications.

Skull: No calvarial fracture. Unchanged left parietal calvarial
exostosis.

Sinuses/Orbits: Visualized orbits show no acute finding. No
significant paranasal sinus disease at the imaged levels.

CT CERVICAL SPINE FINDINGS

Alignment: Straightening of the expected cervical lordosis. Trace
C3-C4 grade 1 retrolisthesis. 2 mm C4-C5 grade 1 anterolisthesis.
Trace C5-C6 grade 1 anterolisthesis.

Skull base and vertebrae: The basion-dental and atlanto-dental
intervals are maintained.No evidence of acute fracture to the
cervical spine.

Soft tissues and spinal canal: No prevertebral fluid or swelling. No
visible canal hematoma.

Disc levels: Cervical spondylosis with multilevel disc space
narrowing, disc bulges, uncovertebral hypertrophy and facet
arthrosis. No appreciable high-grade spinal canal stenosis.
Multilevel bony neural foraminal narrowing. Possible right T1-T2
facet joint ankylosis.

Upper chest: Reported separately. Partially imaged right greater
than left pleural effusions.

Other: Partially imaged acute fracture of the proximal right
humerus. Redemonstrated partially imaged very large multinodular
thyroid goiter measuring at least 10 cm and extending into the
mediastinum. Associated rightward deviation of the subglottic
trachea.
IMPRESSION: CT head:

1. No evidence of acute intracranial abnormality.
2. Stable parenchymal atrophy with chronic infarcts and chronic
small vessel ischemic disease, as described.

CT cervical spine:

1. No evidence of acute fracture to the cervical spine.
2. Straightening of the expected cervical lordosis.
3. Mild grade 1 spondylolisthesis at C3-C4, C4-C5 and C5-C6 as
described.
4. Cervical spondylosis, as described.
5. Redemonstrated and partially imaged very large multinodular
thyroid goiter measuring at least 10 cm with extension into the
mediastinum. Associated rightward deviation of the subglottic
trachea. Surgical consultation is recommended, if not already
obtained.
6. Partially imaged acute fracture of the proximal right humerus.
7. Please refer to the concurrently performed chest CT for a
description of intrathoracic findings.
